# Patient Record
Sex: Male | Born: 1955 | Race: White | Hispanic: No | Marital: Married | Smoking: Former smoker
Health system: Southern US, Community
[De-identification: ages and names within clinical notes are randomized; demographics above are authoritative.]

## PROBLEM LIST (undated history)

## (undated) DIAGNOSIS — R52 Pain, unspecified: Secondary | ICD-10-CM

## (undated) DIAGNOSIS — I739 Peripheral vascular disease, unspecified: Secondary | ICD-10-CM

## (undated) DIAGNOSIS — H209 Unspecified iridocyclitis: Secondary | ICD-10-CM

## (undated) DIAGNOSIS — R972 Elevated prostate specific antigen [PSA]: Secondary | ICD-10-CM

## (undated) DIAGNOSIS — I7090 Unspecified atherosclerosis: Secondary | ICD-10-CM

## (undated) DIAGNOSIS — J449 Chronic obstructive pulmonary disease, unspecified: Secondary | ICD-10-CM

## (undated) DIAGNOSIS — R51 Headache: Secondary | ICD-10-CM

## (undated) DIAGNOSIS — F419 Anxiety disorder, unspecified: Secondary | ICD-10-CM

## (undated) DIAGNOSIS — R350 Frequency of micturition: Secondary | ICD-10-CM

## (undated) DIAGNOSIS — G709 Myoneural disorder, unspecified: Secondary | ICD-10-CM

## (undated) DIAGNOSIS — N281 Cyst of kidney, acquired: Secondary | ICD-10-CM

## (undated) DIAGNOSIS — K5790 Diverticulosis of intestine, part unspecified, without perforation or abscess without bleeding: Secondary | ICD-10-CM

## (undated) DIAGNOSIS — M1990 Unspecified osteoarthritis, unspecified site: Secondary | ICD-10-CM

## (undated) DIAGNOSIS — H269 Unspecified cataract: Secondary | ICD-10-CM

## (undated) DIAGNOSIS — C61 Malignant neoplasm of prostate: Secondary | ICD-10-CM

## (undated) HISTORY — DX: Diverticulosis of intestine, part unspecified, without perforation or abscess without bleeding: K57.90

## (undated) HISTORY — PX: EYE SURGERY: SHX253

## (undated) HISTORY — DX: Cyst of kidney, acquired: N28.1

## (undated) HISTORY — PX: BACK SURGERY: SHX140

## (undated) HISTORY — DX: Malignant neoplasm of prostate: C61

## (undated) HISTORY — PX: TONSILLECTOMY: SUR1361

## (undated) HISTORY — PX: CATARACT EXTRACTION W/ INTRAOCULAR LENS IMPLANT: SHX1309

## (undated) HISTORY — DX: Unspecified cataract: H26.9

## (undated) HISTORY — PX: INGUINAL HERNIA REPAIR: SUR1180

---

## 1994-12-30 HISTORY — PX: OTHER SURGICAL HISTORY: SHX169

## 1998-06-08 ENCOUNTER — Ambulatory Visit (HOSPITAL_BASED_OUTPATIENT_CLINIC_OR_DEPARTMENT_OTHER): Admission: RE | Admit: 1998-06-08 | Discharge: 1998-06-08 | Payer: Self-pay | Admitting: General Surgery

## 2001-01-02 ENCOUNTER — Ambulatory Visit (HOSPITAL_COMMUNITY): Admission: RE | Admit: 2001-01-02 | Discharge: 2001-01-02 | Payer: Self-pay | Admitting: Ophthalmology

## 2001-01-02 ENCOUNTER — Encounter: Payer: Self-pay | Admitting: Ophthalmology

## 2012-07-29 ENCOUNTER — Other Ambulatory Visit: Payer: Self-pay | Admitting: Orthopedic Surgery

## 2012-07-29 DIAGNOSIS — M48061 Spinal stenosis, lumbar region without neurogenic claudication: Secondary | ICD-10-CM

## 2012-08-03 ENCOUNTER — Ambulatory Visit
Admission: RE | Admit: 2012-08-03 | Discharge: 2012-08-03 | Disposition: A | Discharge status: Home / Self Care | Payer: PRIVATE HEALTH INSURANCE | Source: Ambulatory Visit | Attending: Orthopedic Surgery | Admitting: Orthopedic Surgery

## 2012-08-03 VITALS — BP 93/59 | HR 68

## 2012-08-03 DIAGNOSIS — M48061 Spinal stenosis, lumbar region without neurogenic claudication: Secondary | ICD-10-CM

## 2012-08-03 MED ORDER — DIAZEPAM 5 MG PO TABS
10.0000 mg | ORAL_TABLET | Freq: Once | ORAL | Status: AC
  Administered 2012-08-03: 10 mg via ORAL

## 2012-08-03 MED ORDER — IOHEXOL 180 MG/ML  SOLN
15.0000 mL | Freq: Once | INTRAMUSCULAR | Status: AC | PRN
  Administered 2012-08-03: 15 mL via INTRATHECAL

## 2013-01-12 ENCOUNTER — Other Ambulatory Visit: Payer: Self-pay | Admitting: Family Medicine

## 2013-01-12 DIAGNOSIS — N5089 Other specified disorders of the male genital organs: Secondary | ICD-10-CM

## 2013-01-15 ENCOUNTER — Other Ambulatory Visit: Payer: Self-pay | Admitting: Family Medicine

## 2013-01-15 ENCOUNTER — Ambulatory Visit
Admission: RE | Admit: 2013-01-15 | Discharge: 2013-01-15 | Disposition: A | Discharge status: Home / Self Care | Payer: PRIVATE HEALTH INSURANCE | Source: Ambulatory Visit | Attending: Family Medicine | Admitting: Family Medicine

## 2013-01-15 DIAGNOSIS — N5089 Other specified disorders of the male genital organs: Secondary | ICD-10-CM

## 2013-02-05 ENCOUNTER — Other Ambulatory Visit: Payer: Self-pay | Admitting: Urology

## 2013-02-24 ENCOUNTER — Other Ambulatory Visit: Payer: Self-pay | Admitting: Urology

## 2013-02-25 ENCOUNTER — Other Ambulatory Visit: Payer: Self-pay | Admitting: Urology

## 2013-02-26 ENCOUNTER — Encounter (HOSPITAL_BASED_OUTPATIENT_CLINIC_OR_DEPARTMENT_OTHER): Payer: Self-pay | Admitting: *Deleted

## 2013-02-26 NOTE — Progress Notes (Signed)
NPO AFTER MN. ARRIVES AT 0615. NEEDS HG. WILL DO FLEET ENEMA  AM OF SURG.

## 2013-03-05 ENCOUNTER — Encounter (HOSPITAL_BASED_OUTPATIENT_CLINIC_OR_DEPARTMENT_OTHER): Payer: Self-pay | Admitting: Anesthesiology

## 2013-03-05 ENCOUNTER — Ambulatory Visit (HOSPITAL_BASED_OUTPATIENT_CLINIC_OR_DEPARTMENT_OTHER)
Admission: RE | Admit: 2013-03-05 | Discharge: 2013-03-05 | Disposition: A | Discharge status: Home / Self Care | Payer: PRIVATE HEALTH INSURANCE | Source: Ambulatory Visit | Attending: Urology | Admitting: Urology

## 2013-03-05 ENCOUNTER — Encounter (HOSPITAL_BASED_OUTPATIENT_CLINIC_OR_DEPARTMENT_OTHER)
Admission: RE | Disposition: A | Discharge status: Home / Self Care | Payer: Self-pay | Source: Ambulatory Visit | Attending: Urology

## 2013-03-05 ENCOUNTER — Ambulatory Visit (HOSPITAL_BASED_OUTPATIENT_CLINIC_OR_DEPARTMENT_OTHER): Payer: PRIVATE HEALTH INSURANCE | Admitting: Anesthesiology

## 2013-03-05 ENCOUNTER — Encounter (HOSPITAL_BASED_OUTPATIENT_CLINIC_OR_DEPARTMENT_OTHER): Payer: Self-pay | Admitting: *Deleted

## 2013-03-05 DIAGNOSIS — C61 Malignant neoplasm of prostate: Secondary | ICD-10-CM | POA: Insufficient documentation

## 2013-03-05 DIAGNOSIS — N400 Enlarged prostate without lower urinary tract symptoms: Secondary | ICD-10-CM | POA: Insufficient documentation

## 2013-03-05 DIAGNOSIS — F17200 Nicotine dependence, unspecified, uncomplicated: Secondary | ICD-10-CM | POA: Insufficient documentation

## 2013-03-05 DIAGNOSIS — R972 Elevated prostate specific antigen [PSA]: Secondary | ICD-10-CM | POA: Insufficient documentation

## 2013-03-05 DIAGNOSIS — N433 Hydrocele, unspecified: Secondary | ICD-10-CM | POA: Insufficient documentation

## 2013-03-05 DIAGNOSIS — Z79899 Other long term (current) drug therapy: Secondary | ICD-10-CM | POA: Insufficient documentation

## 2013-03-05 HISTORY — DX: Unspecified osteoarthritis, unspecified site: M19.90

## 2013-03-05 HISTORY — PX: PROSTATE BIOPSY: SHX241

## 2013-03-05 HISTORY — DX: Elevated prostate specific antigen (PSA): R97.20

## 2013-03-05 HISTORY — DX: Malignant neoplasm of prostate: C61

## 2013-03-05 HISTORY — PX: HYDROCELE EXCISION: SHX482

## 2013-03-05 LAB — POCT HEMOGLOBIN-HEMACUE: Hemoglobin: 15.3 g/dL (ref 13.0–17.0)

## 2013-03-05 SURGERY — HYDROCELECTOMY
Anesthesia: General | Wound class: Clean

## 2013-03-05 MED ORDER — LIDOCAINE HCL (CARDIAC) 20 MG/ML IV SOLN
INTRAVENOUS | Status: DC | PRN
  Administered 2013-03-05: 40 mg via INTRAVENOUS
  Administered 2013-03-05: 60 mg via INTRAVENOUS

## 2013-03-05 MED ORDER — FENTANYL CITRATE 0.05 MG/ML IJ SOLN
INTRAMUSCULAR | Status: DC | PRN
  Administered 2013-03-05 (×4): 50 ug via INTRAVENOUS

## 2013-03-05 MED ORDER — LACTATED RINGERS IV SOLN
INTRAVENOUS | Status: DC | PRN
  Administered 2013-03-05 (×2): via INTRAVENOUS

## 2013-03-05 MED ORDER — CEFAZOLIN SODIUM 1-5 GM-% IV SOLN
1.0000 g | INTRAVENOUS | Status: DC
  Administered 2013-03-05: 2 g via INTRAVENOUS
  Filled 2013-03-05: qty 50

## 2013-03-05 MED ORDER — FENTANYL CITRATE 0.05 MG/ML IJ SOLN
25.0000 ug | INTRAMUSCULAR | Status: DC | PRN
  Filled 2013-03-05: qty 1

## 2013-03-05 MED ORDER — DEXAMETHASONE SODIUM PHOSPHATE 4 MG/ML IJ SOLN
INTRAMUSCULAR | Status: DC | PRN
  Administered 2013-03-05: 10 mg via INTRAVENOUS

## 2013-03-05 MED ORDER — PROPOFOL 10 MG/ML IV BOLUS
INTRAVENOUS | Status: DC | PRN
  Administered 2013-03-05: 50 mg via INTRAVENOUS
  Administered 2013-03-05: 250 mg via INTRAVENOUS

## 2013-03-05 MED ORDER — ONDANSETRON HCL 4 MG/2ML IJ SOLN
INTRAMUSCULAR | Status: DC | PRN
  Administered 2013-03-05: 4 mg via INTRAVENOUS

## 2013-03-05 MED ORDER — LACTATED RINGERS IV SOLN
INTRAVENOUS | Status: DC
  Administered 2013-03-05: 07:00:00 via INTRAVENOUS
  Filled 2013-03-05: qty 1000

## 2013-03-05 MED ORDER — CEPHALEXIN 500 MG PO CAPS: 500.0000 mg | ORAL_CAPSULE | Freq: Two times a day (BID) | ORAL | Status: DC

## 2013-03-05 MED ORDER — ACETAMINOPHEN 10 MG/ML IV SOLN
INTRAVENOUS | Status: DC | PRN
  Administered 2013-03-05: 1000 mg via INTRAVENOUS

## 2013-03-05 MED ORDER — KETOROLAC TROMETHAMINE 30 MG/ML IJ SOLN
15.0000 mg | Freq: Once | INTRAMUSCULAR | Status: DC | PRN
  Filled 2013-03-05: qty 1

## 2013-03-05 MED ORDER — BUPIVACAINE-EPINEPHRINE 0.25% -1:200000 IJ SOLN
INTRAMUSCULAR | Status: DC | PRN
  Administered 2013-03-05: 8 mL

## 2013-03-05 MED ORDER — OXYCODONE-ACETAMINOPHEN 5-325 MG PO TABS
1.0000 | ORAL_TABLET | ORAL | Status: DC | PRN
Start: 2013-03-05 — End: 2013-04-22

## 2013-03-05 MED ORDER — MIDAZOLAM HCL 5 MG/5ML IJ SOLN
INTRAMUSCULAR | Status: DC | PRN
  Administered 2013-03-05: 2 mg via INTRAVENOUS

## 2013-03-05 MED ORDER — PROMETHAZINE HCL 25 MG/ML IJ SOLN
6.2500 mg | INTRAMUSCULAR | Status: DC | PRN
  Filled 2013-03-05: qty 1

## 2013-03-05 SURGICAL SUPPLY — 35 items
BANDAGE GAUZE ELAST BULKY 4 IN (GAUZE/BANDAGES/DRESSINGS) ×3 IMPLANT
BLADE SURG 15 STRL LF DISP TIS (BLADE) ×2 IMPLANT
BLADE SURG 15 STRL SS (BLADE) ×3
CLOTH BEACON ORANGE TIMEOUT ST (SAFETY) ×3 IMPLANT
COVER MAYO STAND STRL (DRAPES) ×3 IMPLANT
COVER TABLE BACK 60X90 (DRAPES) ×6 IMPLANT
DRAIN PENROSE 18X1/4 LTX STRL (WOUND CARE) ×1 IMPLANT
DRAPE PED LAPAROTOMY (DRAPES) ×3 IMPLANT
DRESSING TELFA 8X3 (GAUZE/BANDAGES/DRESSINGS) ×3 IMPLANT
ELECT REM PT RETURN 9FT ADLT (ELECTROSURGICAL) ×3
ELECTRODE REM PT RTRN 9FT ADLT (ELECTROSURGICAL) ×2 IMPLANT
GAUZE SPONGE 4X4 16PLY XRAY LF (GAUZE/BANDAGES/DRESSINGS) ×2 IMPLANT
GLOVE BIOGEL M STRL SZ7.5 (GLOVE) ×3 IMPLANT
GLOVE ECLIPSE 6.5 STRL STRAW (GLOVE) ×1 IMPLANT
GLOVE INDICATOR 6.5 STRL GRN (GLOVE) ×1 IMPLANT
GLOVE SURG SS PI 8.0 STRL IVOR (GLOVE) IMPLANT
GOWN STRL REIN XL XLG (GOWN DISPOSABLE) ×3 IMPLANT
NEEDLE HYPO 22GX1.5 SAFETY (NEEDLE) ×1 IMPLANT
NS IRRIG 500ML POUR BTL (IV SOLUTION) ×3 IMPLANT
PACK BASIN DAY SURGERY FS (CUSTOM PROCEDURE TRAY) ×3 IMPLANT
SPONGE LAP 4X18 X RAY DECT (DISPOSABLE) ×5 IMPLANT
SUPPORT SCROTAL LG STRP (MISCELLANEOUS) ×1 IMPLANT
SUPPORT SCROTAL MED ADLT STRP (MISCELLANEOUS) IMPLANT
SURGILUBE 2OZ TUBE FLIPTOP (MISCELLANEOUS) ×3 IMPLANT
SUT CHROMIC 3 0 SH 27 (SUTURE) ×2 IMPLANT
SUT CHROMIC 4 0 RB 1X27 (SUTURE) IMPLANT
SUT VIC AB 2-0 SH 27 (SUTURE) ×6
SUT VIC AB 2-0 SH 27XBRD (SUTURE) IMPLANT
SYR CONTROL 10ML LL (SYRINGE) ×1 IMPLANT
TOWEL OR 17X24 6PK STRL BLUE (TOWEL DISPOSABLE) ×4 IMPLANT
TRAY DSU PREP LF (CUSTOM PROCEDURE TRAY) ×3 IMPLANT
TUBE CONNECTING 12X1/4 (SUCTIONS) ×3 IMPLANT
UNDERPAD 30X30 INCONTINENT (UNDERPADS AND DIAPERS) ×3 IMPLANT
WATER STERILE IRR 500ML POUR (IV SOLUTION) IMPLANT
YANKAUER SUCT BULB TIP NO VENT (SUCTIONS) ×3 IMPLANT

## 2013-03-05 NOTE — H&P (Signed)
Urology Admission H&P  History of Present Illness:   This is a 57 year old male who presents for left hydrocelectomy and prostate biopsy. He is noted to have left scrotal swelling and an ultrasound revealed a large 12 cm left hydrocele. The left testicle appeared normal.  PSA screening was performed as the patient has a large prostate on exam. His PSA came back significantly elevated at 14. A few weeks later the PSA was repeated and noted to be 14 again. Patient has no history of prostate biopsy.  Past Medical History  Diagnosis Date  . Arthritis     lumbar  . Elevated PSA   . Hydrocele, left    Past Surgical History  Procedure Laterality Date  . Removal bursa sac, left elbow  1996  . Inguinal hernia repair Bilateral AGE 52  . Cataract extraction w/ intraocular lens implant Left     Home Medications:  Prescriptions prior to admission  Medication Sig Dispense Refill  . levofloxacin (LEVAQUIN) 500 MG tablet Take 500 mg by mouth daily.       Allergies: No Known Allergies  History reviewed. No pertinent family history. Social History:  reports that he has been smoking Cigarettes.  He has a 60 pack-year smoking history. He has never used smokeless tobacco. He reports that he drinks about 4.0 ounces of alcohol per week. He reports that he does not use illicit drugs.  Review of Systems  All other systems reviewed and are negative.    Physical Exam:  Vital signs in last 24 hours: Temp:  [98 F (36.7 C)] 98 F (36.7 C) (03/07 0647) Pulse Rate:  [81] 81 (03/07 0647) Resp:  [18] 18 (03/07 0647) BP: (118)/(79) 118/79 mmHg (03/07 0647) SpO2:  [96 %] 96 % (03/07 0647) Weight:  [77.565 kg (171 lb)] 77.565 kg (171 lb) (03/07 0647) Physical Exam  Constitutional: He is oriented to person, place, and time. He appears well-developed and well-nourished.  HENT:  Head: Normocephalic.  Neck: Normal range of motion.  Cardiovascular: Normal rate and regular rhythm.   Respiratory: Effort  normal.  GI: Soft.  Neurological: He is alert and oriented to person, place, and time.  Skin: Skin is warm and dry.  Psychiatric: He has a normal mood and affect.    Laboratory Data:  Results for orders placed during the hospital encounter of 03/05/13 (from the past 24 hour(s))  POCT HEMOGLOBIN-HEMACUE     Status: None   Collection Time    03/05/13  7:08 AM      Result Value Range   Hemoglobin 15.3  13.0 - 17.0 g/dL   No results found for this or any previous visit (from the past 240 hour(s)). Creatinine: No results found for this basename: CREATININE,  in the last 168 hours  Impression/Assessment:  Left hydrocele Elevated PSA BPH on exam  Plan:  1) Transrectal ultrasound and prostate biopsy - we went over again the nature risks and benefits of PSA screening. We discussed the nature risks benefits and alternatives to transrectal ultrasound and prostate biopsy. We discussed risk such as bleeding infection and urinary retention among others. We discussed this prostate biopsy is positive for prostate cancer management may include active surveillance or treatment with surgery or radiation. All questions answered and elected to proceed with biopsy. 2) left hydrocelectomy - I discussed with the patient the nature, potential benefits, risks and alternatives to hydrocelectomy including surveillance/do nothing or aspiration, including side effects of the proposed treatment, the likelihood of the patient achieving the  goals of the procedure, and any potential problems that might occur during the procedure or recuperation. All questions answered. Patient elects to proceed with hydrocelectomy.     Antony Haste 03/05/2013, 8:12 AM

## 2013-03-05 NOTE — Anesthesia Preprocedure Evaluation (Addendum)
Anesthesia Evaluation  Patient identified by MRN, date of birth, ID band Patient awake    Reviewed: Allergy & Precautions, H&P , NPO status , Patient's Chart, lab work & pertinent test results  Airway Mallampati: II TM Distance: >3 FB Neck ROM: Full    Dental no notable dental hx.    Pulmonary Current Smoker,  breath sounds clear to auscultation  Pulmonary exam normal       Cardiovascular negative cardio ROS  Rhythm:Regular Rate:Normal     Neuro/Psych negative neurological ROS  negative psych ROS   GI/Hepatic negative GI ROS, Neg liver ROS,   Endo/Other  negative endocrine ROS  Renal/GU negative Renal ROS  negative genitourinary   Musculoskeletal negative musculoskeletal ROS (+)   Abdominal   Peds negative pediatric ROS (+)  Hematology negative hematology ROS (+)   Anesthesia Other Findings   Reproductive/Obstetrics negative OB ROS                           Anesthesia Physical Anesthesia Plan  ASA: II  Anesthesia Plan: General   Post-op Pain Management:    Induction: Intravenous  Airway Management Planned: LMA  Additional Equipment:   Intra-op Plan:   Post-operative Plan:   Informed Consent: I have reviewed the patients History and Physical, chart, labs and discussed the procedure including the risks, benefits and alternatives for the proposed anesthesia with the patient or authorized representative who has indicated his/her understanding and acceptance.   Dental advisory given  Plan Discussed with: CRNA and Surgeon  Anesthesia Plan Comments: (Patient had vagal reaction 5-10 minutes post IV start; 94/65 pulse 70's  Feeling better after fluid bolus)       Anesthesia Quick Evaluation

## 2013-03-05 NOTE — Transfer of Care (Signed)
Immediate Anesthesia Transfer of Care Note  Patient: David King  Procedure(s) Performed: Procedure(s) (LRB): HYDROCELECTOMY ADULT (Left) PROSTATE BIOPSY and ultrasound (N/A)  Patient Location: PACU  Anesthesia Type: General  Level of Consciousness: drowsy and restless  Airway & Oxygen Therapy: Patient Spontanous Breathing and Patient connected to face mask oxygen  Post-op Assessment: Report given to PACU RN and Post -op Vital signs reviewed and stable  Post vital signs: Reviewed and stable  Complications: No apparent anesthesia complications

## 2013-03-05 NOTE — Anesthesia Procedure Notes (Signed)
Procedure Name: LMA Insertion Date/Time: 03/05/2013 8:15 AM Performed by: Norva Pavlov Pre-anesthesia Checklist: Patient identified, Emergency Drugs available, Suction available and Patient being monitored Patient Re-evaluated:Patient Re-evaluated prior to inductionOxygen Delivery Method: Circle System Utilized Preoxygenation: Pre-oxygenation with 100% oxygen Intubation Type: IV induction Ventilation: Mask ventilation without difficulty LMA: LMA inserted LMA Size: 4.0 Number of attempts: 1 Airway Equipment and Method: bite block Placement Confirmation: positive ETCO2 Tube secured with: Tape Dental Injury: Teeth and Oropharynx as per pre-operative assessment

## 2013-03-05 NOTE — Op Note (Signed)
Pre-operative diagnosis: left hydrocele, BPH, elevated PSA Post-operative diagnosis: left hydrocele, BPH, elevated PSA  Procedure: EUA TRUS prostate Prostate needle biopsy US guidance for needle biopsy Left hydrocelectomy  Surgeon: Eward Rutigliano  Type of Anesthesia: Gen  Findings: EUA -  DRE: 40 gram prostate, subtle induration of left, slight L > R lobe asymmetry with left lobe slightly larger. No distinct hard area or nodule.   Prostate U/S - calcifications along urethra; no focal hypoechoic area;  Length 3.31 cm Width 5 cm Height 2.4 cm Size 20.76 g  Left hydrocele, 450 mL  Description of procedure: After consent was obtained patient brought to the operating room. After adequate anesthesia he is placed in left lateral decubitus position with the right side up. The transrectal ultrasound probe was placed per rectum and prostate images were obtained axially from the seminal vesicles, base to apex. Also sagittal views left mid and right were obtained.  A standard 12 core prostate biopsy was then performed at the left and right base mid and apex x2 each going from lateral to medial.   The patient was then placed supine . The external genitalia were prepped and draped in the usual sterile fashion.a 16 French Foley catheter was placed and left to gravity drainage. The left hemiscrotum was infiltrated with quarter percent Marcaine with epi and a transverse incision made. The dartos and cremasteric fascia was dissected down to a large hydrocele sac and peeled off superiorly inferiorly and posteriorly. The hydrocele sac was then delivered and further sweeping with a moist lap and allowed all the surrounding fascial layers to be cleaned off. The hydrocele sac was then opened and noted to have straw-colored fluid. 450 mL of fluid was drained. The testicle was delivered and was palpably normal. The hydrocele sac narrowed and and there was a connection to another more superior hydrocele sac that was  only 3-4 cm in size. Prior to opening the hydrocele sac pressure on the large sac cause fluid to travels superiorly into the smaller sac but not up toward the cord. Both the main hydrocele sac and the smaller more severe sac were opened and no connection was seen with the cord. The excess sac from the main large sac was excised and sent to pathology. No sac superiorly was removed. Adequate hemostasis was insured in the more superior hydrocele sac was everted around the cord with a figure-of-eight suture and was loose without any constriction. A connection to the main sac was left anteriorly on the cord in this area was not everted or sutured as not to put any pressure on the cord. Then again the main sac was everted and sutured with a running 2-0 Vicryl suture. Adequate hemostasis was insured. The Penrose drain was placed in the scrotal exiting out a separate stab wound in the more dependent location. The testicle was then placed back in the scrotum without torsion. The  Dartos  fascia was closed with a running 2-0 Vicryl suture. The skin was closed with chromic interrupted horizontal mattress sutures. The drain was secured with a loose chromic.  The urine was clear in the Foley catheter was removed. The scrotal incision was cleaned and Telfa fluffs, gauze and a jock strap were placed. He was awakened taken to room in stable condition.  Complications: None  Blood loss: Minimal  Drains: Penrose left hemiscrotum  Specimens: 1) prostate needle biopsy x12, individually labeled 2) hydrocele sac Specimens to pathology  Disposition: Patient stable to PACU.

## 2013-03-05 NOTE — Anesthesia Postprocedure Evaluation (Signed)
  Anesthesia Post-op Note  Patient: David King  Procedure(s) Performed: Procedure(s) (LRB): HYDROCELECTOMY ADULT (Left) PROSTATE BIOPSY and ultrasound (N/A)  Patient Location: PACU  Anesthesia Type: General  Level of Consciousness: awake and alert   Airway and Oxygen Therapy: Patient Spontanous Breathing  Post-op Pain: mild  Post-op Assessment: Post-op Vital signs reviewed, Patient's Cardiovascular Status Stable, Respiratory Function Stable, Patent Airway and No signs of Nausea or vomiting  Last Vitals:  Filed Vitals:   03/05/13 0953  BP: 108/72  Pulse: 76  Temp: 36.3 C  Resp: 14    Post-op Vital Signs: stable   Complications: No apparent anesthesia complications

## 2013-03-08 ENCOUNTER — Encounter (HOSPITAL_BASED_OUTPATIENT_CLINIC_OR_DEPARTMENT_OTHER): Payer: Self-pay | Admitting: Urology

## 2013-03-11 ENCOUNTER — Other Ambulatory Visit (HOSPITAL_COMMUNITY): Payer: Self-pay | Admitting: Urology

## 2013-03-11 DIAGNOSIS — C61 Malignant neoplasm of prostate: Secondary | ICD-10-CM

## 2013-03-19 ENCOUNTER — Encounter (HOSPITAL_COMMUNITY)
Admission: RE | Admit: 2013-03-19 | Discharge: 2013-03-19 | Disposition: A | Discharge status: Home / Self Care | Payer: PRIVATE HEALTH INSURANCE | Source: Ambulatory Visit | Attending: Urology | Admitting: Urology

## 2013-03-19 DIAGNOSIS — N281 Cyst of kidney, acquired: Secondary | ICD-10-CM

## 2013-03-19 DIAGNOSIS — C61 Malignant neoplasm of prostate: Secondary | ICD-10-CM | POA: Insufficient documentation

## 2013-03-19 HISTORY — DX: Cyst of kidney, acquired: N28.1

## 2013-03-19 MED ORDER — TECHNETIUM TC 99M MEDRONATE IV KIT
25.2000 | PACK | Freq: Once | INTRAVENOUS | Status: AC | PRN
  Administered 2013-03-19: 25.2 via INTRAVENOUS

## 2013-03-24 NOTE — Addendum Note (Signed)
Addendum created 03/24/13 0858 by Norva Pavlov, CRNA   Modules edited: Anesthesia Medication Administration

## 2013-03-31 ENCOUNTER — Other Ambulatory Visit: Payer: Self-pay | Admitting: Urology

## 2013-04-22 ENCOUNTER — Encounter: Payer: Self-pay | Admitting: *Deleted

## 2013-04-22 ENCOUNTER — Encounter (HOSPITAL_COMMUNITY): Payer: Self-pay | Admitting: Pharmacy Technician

## 2013-04-22 DIAGNOSIS — C61 Malignant neoplasm of prostate: Secondary | ICD-10-CM | POA: Insufficient documentation

## 2013-04-22 NOTE — Progress Notes (Addendum)
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

## 2013-04-22 NOTE — Progress Notes (Signed)
GU Location of Tumor / Histology: Prostate, 03/05/13 bx  If Prostate Cancer, Gleason Score is 3 + 4 & 3+3 ) and PSA is ( 14.8) volume=40cc  Patient presented elevated PSA ,left scrotal swelling 03/05/13 ,  Biopsies of Prostate 03/05/13  revealed Adenocarcinoma   Past/Anticipated interventions by urology, if any: U/S  01/15/13 =large 12 cm left hydrocele, PSA lab =14  Past/Anticipated interventions by medical oncology, if any: Robotic  Laparoscopic Prostatectomy scheduled 05/06/13 with Dr.Borden  Weight changes, if any: no Bowel/Bladder complaints, if WUJ:WJXBJYNWG,NFAOZHYQMVHQI,ONGEXBM,WUXL stream ,ED  Nausea/Vomiting, if any: No Pain issues, if any:  No SAFETY ISSUES:No  Prior radiationNO  Pacemaker/ICD? No  Possible current pregnancy? N/A Is the patient on methotrexate? *No Current Complaints / other details:nocturia x2,

## 2013-04-26 ENCOUNTER — Encounter: Payer: Self-pay | Admitting: Radiation Oncology

## 2013-04-26 ENCOUNTER — Encounter (HOSPITAL_COMMUNITY)
Admission: RE | Admit: 2013-04-26 | Discharge: 2013-04-26 | Disposition: A | Discharge status: Home / Self Care | Payer: PRIVATE HEALTH INSURANCE | Source: Ambulatory Visit | Attending: Urology | Admitting: Urology

## 2013-04-26 ENCOUNTER — Ambulatory Visit
Admission: RE | Admit: 2013-04-26 | Discharge: 2013-04-26 | Disposition: A | Discharge status: Home / Self Care | Payer: PRIVATE HEALTH INSURANCE | Source: Ambulatory Visit | Attending: Radiation Oncology | Admitting: Radiation Oncology

## 2013-04-26 ENCOUNTER — Encounter (HOSPITAL_COMMUNITY): Payer: Self-pay

## 2013-04-26 ENCOUNTER — Ambulatory Visit (HOSPITAL_COMMUNITY)
Admission: RE | Admit: 2013-04-26 | Discharge: 2013-04-26 | Disposition: A | Discharge status: Home / Self Care | Payer: PRIVATE HEALTH INSURANCE | Source: Ambulatory Visit | Attending: Urology | Admitting: Urology

## 2013-04-26 VITALS — BP 108/60 | HR 80 | Temp 97.8°F | Resp 20 | Ht 69.0 in | Wt 171.9 lb

## 2013-04-26 DIAGNOSIS — F17200 Nicotine dependence, unspecified, uncomplicated: Secondary | ICD-10-CM | POA: Insufficient documentation

## 2013-04-26 DIAGNOSIS — C61 Malignant neoplasm of prostate: Secondary | ICD-10-CM

## 2013-04-26 HISTORY — DX: Unspecified iridocyclitis: H20.9

## 2013-04-26 HISTORY — DX: Myoneural disorder, unspecified: G70.9

## 2013-04-26 HISTORY — DX: Pain, unspecified: R52

## 2013-04-26 HISTORY — DX: Frequency of micturition: R35.0

## 2013-04-26 HISTORY — DX: Headache: R51

## 2013-04-26 HISTORY — DX: Anxiety disorder, unspecified: F41.9

## 2013-04-26 LAB — CBC
HCT: 39.7 % (ref 39.0–52.0)
Hemoglobin: 13.5 g/dL (ref 13.0–17.0)
MCH: 32.8 pg (ref 26.0–34.0)
MCHC: 34 g/dL (ref 30.0–36.0)
MCV: 96.6 fL (ref 78.0–100.0)
Platelets: 262 10*3/uL (ref 150–400)
RBC: 4.11 MIL/uL — ABNORMAL LOW (ref 4.22–5.81)
RDW: 13.9 % (ref 11.5–15.5)
WBC: 7.8 10*3/uL (ref 4.0–10.5)

## 2013-04-26 LAB — BASIC METABOLIC PANEL
BUN: 14 mg/dL (ref 6–23)
CO2: 30 mEq/L (ref 19–32)
Calcium: 10 mg/dL (ref 8.4–10.5)
Chloride: 102 mEq/L (ref 96–112)
Creatinine, Ser: 1.04 mg/dL (ref 0.50–1.35)
GFR calc Af Amer: 90 mL/min — ABNORMAL LOW (ref >90)
GFR calc non Af Amer: 77 mL/min — ABNORMAL LOW (ref >90)
Glucose, Bld: 96 mg/dL (ref 70–99)
Potassium: 4 mEq/L (ref 3.5–5.1)
Sodium: 140 mEq/L (ref 135–145)

## 2013-04-26 LAB — SURGICAL PCR SCREEN
MRSA, PCR: NEGATIVE
Staphylococcus aureus: NEGATIVE

## 2013-04-26 NOTE — Progress Notes (Signed)
Radiation Oncology         [phone] ________________________________  Initial outpatient Consultation  Name: David King MRN: [id number]  Date: 04/26/2013  DOB: [birth year]  WU:JWJX,BJYNWGN Hessie Diener, MD  Crecencio Mc, MD   REFERRING PHYSICIAN: Crecencio Mc, MD  DIAGNOSIS: 57 y.o. gentleman with stage T1c adenocarcinoma of the prostate with a Gleason's score of 4+3 and a PSA of 14.8  HISTORY OF PRESENT ILLNESS::David King is a 57 y.o. gentleman.  He was initially seen by Dr. Mena Goes for a 12 cm hydrocele.  At that time he was also noted to have an elevated PSA of 14.8.  Digital rectal examination was performed at that time revealing no nodules.  The patient proceeded to hydrocele repair and transrectal ultrasound with 12 biopsies of the prostate on 03/05/13.  Out of 12 core biopsies, 12 were positive.  The maximum Gleason score was 4+3.  Bone scan and CT pelvis show no metastatic disease.  The patient has met with Dr. Laverle Patter.  At this time, he is tentatively planning robotic assisted laparoscopic radical prostatectomy, and he has kindly been referred today for discussion of potential radiation treatment options as an alternative to surgery, and moreover as a post-operative treatment for adjuvant versus salvage therapy, given the increased risk for adverse pathology features.   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    PAST SURGICAL HISTORY: Past Surgical History  Procedure Laterality Date  . Removal bursa sac, left elbow  1996  . Inguinal hernia repair Bilateral AGE 89  . Cataract extraction w/ intraocular lens implant Left   . Hydrocele excision Left 03/05/2013    Procedure: HYDROCELECTOMY ADULT;  Surgeon: Antony Haste, MD;  Location: [facility];  Service: Urology;  Laterality: Left;  . Prostate biopsy N/A 03/05/2013    Procedure: PROSTATE BIOPSY and ultrasound;  Surgeon: Antony Haste, MD;  Location: [facility];  Service: Urology;  Laterality: N/A;  . Eye surgery      RETINAL SURGERY LEFT EYE  . Tonsillectomy      as achild    FAMILY HISTORY: family history includes Cancer in his father and paternal grandfather.  SOCIAL HISTORY:  reports that he has been smoking Cigarettes.  He has a 60 pack-year smoking history. He has never used smokeless tobacco. He reports that he drinks about 4.0 ounces of alcohol per week. He reports that he does not use illicit drugs.  ALLERGIES: Review of patient's allergies indicates no known allergies.  MEDICATIONS:  Current Outpatient Prescriptions  Medication Sig Dispense Refill  . ibuprofen (ADVIL,MOTRIN) 200 MG tablet Take 200 mg by mouth every 6 (six) hours as needed for pain.      . prednisoLONE acetate (PRED FORTE) 1 % ophthalmic suspension Place 1 drop into the left eye 2 (two) times daily.       No current facility-administered medications for this encounter.    REVIEW OF SYSTEMS:  A 15 point review of systems is documented in the electronic medical record. This was obtained by the nursing staff. However, I reviewed this with the patient to discuss relevant findings and make appropriate changes.  A comprehensive review of systems was negative.Marland Kitchen   PHYSICAL EXAM: This patient is in no acute distress.  He is alert and oriented.   height is 5\' 9"  (  1.753 m) and weight is 171 lb 14.4 oz (77.973 kg). His oral temperature is 97.8 F (36.6 C). His blood pressure is 108/60 and his pulse is 80. His respiration is 20.  He exhibits no respiratory distress or labored breathing.  He appears neurologically intact.  His mood is pleasant.  His affect is appropriate.  Please note the digital rectal exam findings described above.  LABORATORY DATA:  Lab Results    Component Value Date   WBC 7.8 04/26/2013   HGB 13.5 04/26/2013   HCT 39.7 04/26/2013   MCV 96.6 04/26/2013   PLT 262 04/26/2013   Lab Results  Component Value Date   NA 140 04/26/2013   No results found for this basename: ALT,  AST,  GGT,  ALKPHOS,  BILITOT     RADIOGRAPHY: CT and Bone Scan showed no metastases    IMPRESSION: This gentleman is a  57 y.o. gentleman with stage T1c adenocarcinoma of the prostate with a Gleason's score of 4+3 and a PSA of 14.8.  His T-Stage, Gleason's Score, and PSA put him into the intermediate risk group.  Accordingly he is eligible for a variety of potential treatment options including assisted laparoscopic radical prostatectomy or radiation therapy and androgen deprivation with or without seed implant.  PLAN:Today I reviewed the findings and workup thus far.  We discussed the natural history of prostate cancer.  We reviewed the the implications of T-stage, Gleason's Score, and PSA on decision-making and outcomes related to prostate cancer.  We discussed radiation treatment in the management of prostate cancer with regard to the logistics and delivery of external beam radiation treatment as well as the logistics and delivery of prostate brachytherapy.  We compared and contrasted each of these approaches and also compared these against prostatectomy.    The patient focused most of his questions and interest in robotic-assisted laparoscopic radical prostatectomy.  We discussed some of the potential advantages of surgery including surgical staging, the availability of salvage radiotherapy to the prostatic fossa, and the confidence associated with immediate biochemical response.  We discussed some of the potential proven indications for postoperative radiotherapy including positive margins, extracapsular extension, and seminal vesicle involvement. We also talked about some of the other potential findings leading to a recommendation for radiotherapy including a non-zero  postoperative PSA and positive lymph nodes.   I filled out a patient counseling form for him outlining his disease characteristics with relevant treatment diagrams and a thorough delineation of radiation including the logistics. The counseling form highlighted our discussion on the potential side effects associated with radiation therapy. The patient was given the form and we retained a copy for our records.   The patient would like to proceed with prostatectomy. I enjoyed meeting with him today, and will look forward to participating in the care of this very nice gentleman.  I will look forward to following his progress   I spent 60 minutes minutes face to face with the patient and more than 50% of that time was spent in counseling and/or coordination of care.      ------------------------------------------------  Artist Pais. Kathrynn Running, M.D.

## 2013-04-26 NOTE — Progress Notes (Signed)
Please see the Nurse Progress Note in the MD Initial Consult Encounter for this patient. 

## 2013-04-26 NOTE — Pre-Procedure Instructions (Signed)
EKG AND CXR WERE DONE TODAY - PREOP AT [facility]. 

## 2013-04-26 NOTE — Patient Instructions (Signed)
YOUR SURGERY IS SCHEDULED AT [facility]  ON:  Thursday  5/8  REPORT TO [facility] AT:  8:30 AM      PHONE # FOR SHORT STAY IS [phone]  FOLLOW BOWEL PREP INSTRUCTIONS DAY BEFORE YOUR SURGERY - INSTRUCTIONS FROM DR. BORDEN'S OFFICE.  DO NOT EAT OR DRINK ANYTHING AFTER MIDNIGHT THE NIGHT BEFORE YOUR SURGERY.  YOU MAY BRUSH YOUR TEETH, RINSE OUT YOUR MOUTH--BUT NO WATER, NO FOOD, NO CHEWING GUM, NO MINTS, NO CANDIES, NO CHEWING TOBACCO.  PLEASE TAKE THE FOLLOWING MEDICATIONS THE AM OF YOUR SURGERY WITH A FEW SIPS OF WATER:  USE YOUR EYEDROPS AND BRING YOUR EYEDROPS.    DO NOT BRING VALUABLES, MONEY, CREDIT CARDS.  DO NOT WEAR JEWELRY, MAKE-UP, NAIL POLISH AND NO METAL PINS OR CLIPS IN YOUR HAIR. CONTACT LENS, DENTURES / PARTIALS, GLASSES SHOULD NOT BE WORN TO SURGERY AND IN MOST CASES-HEARING AIDS WILL NEED TO BE REMOVED.  BRING YOUR GLASSES CASE, ANY EQUIPMENT NEEDED FOR YOUR CONTACT LENS. FOR PATIENTS ADMITTED TO THE HOSPITAL--CHECK OUT TIME THE DAY OF DISCHARGE IS 11:00 AM.  ALL INPATIENT ROOMS ARE PRIVATE - WITH BATHROOM, TELEPHONE, TELEVISION AND WIFI INTERNET.                               PLEASE READ OVER ANY  FACT SHEETS THAT YOU WERE GIVEN: MRSA INFORMATION, BLOOD TRANSFUSION INFORMATION, INCENTIVE SPIROMETER INFORMATION. FAILURE TO FOLLOW THESE INSTRUCTIONS MAY RESULT IN THE CANCELLATION OF YOUR SURGERY.   PATIENT SIGNATURE_________________________________

## 2013-04-27 ENCOUNTER — Encounter: Payer: Self-pay | Admitting: Radiation Oncology

## 2013-04-27 LAB — ABO/RH: ABO/RH(D): O POS

## 2013-05-05 NOTE — H&P (Signed)
Chief Complaint  Prostate Cancer     History of Present Illness     [name] is a 57 year old gentleman who presented to Dr. Mena Goes in February 2014 with complaints of a left hydrocele and an elevated PSA.  His PSA remained persistently elevated when rechecked at 14.8.  On 03/05/13, he underwent a hydrocele repair and prostate biopsy at the same setting. 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 He underwent staging studies including a CT scan of the abdomen and pelvis and bone scan on 03/19/13 which did not demonstrate obvious metastatic disease.  However, there was a report of a suspicious 1.2 cm left common iliac lymph node without additional pelvic lymphadenopathy or retroperitoneal lymphadenopathy. However, I independently reviewed his CT scan and this appears to be consistent with a left inguinal lymph node and not a common iliac lymph node. Overall, he is quite healthy with no major medical comorbidities. He initially was under the impression that he did not have a family history of prostate cancer although his mother recently informed him that his father had been diagnosed with prostate cancer although ultimately died of a brain tumor which was unrelated to his prostate cancer diagnosis.  TNM stage: cT1c N0-1 M0 PSA: 14.80 Gleason score: 4+3=7 Biopsy (03/05/13): 12/12 cores positive    Left: L lateral apex (65%, 3+3=6), L apex (40%, 3+3=6, PNI), L lateral mid (60%, 3+4=7), L mid (50%, 3+4=7), L lateral base (90%, 3+4=7, PNI), L base (60%, 3+4=7, PNI)    Right: R apex (95%, 3+4=7, PNI), R lateral apex (100%, 3+4=7, PNI), R mid (10%, 3+4=7), R lateral mid (95%, 4+3=7, PNI), R base (50%, 3+4=7), R lateral base (50%, 3+4=7, PNI) Prostate volume: 20.8 cc  Nomogram OC disease: 56% EPE: 34% SVI: 23% LNI: 4.2% PFS (surgery): 62% at 5 years, 48% at 10 years Cancer specific survival: 99% at 10 years, 98% at 15 years  Urinary  function: He does have moderate baseline voiding symptoms including urinary frequency, intermittency, urgency, and a weak stream. His baseline IPSS is 22 (QOL-3). Erectile function: He does have severe pre-existing erectile dysfunction. SHIM score is 3.   Past Medical History Problems  1. History of  Arthritis V13.4 2. History of  Depression 311  Surgical History Problems  1. History of  Biopsy Of The Prostate Needle 2. History of  Cataract Surgery Left 3. History of  Hernia Repair Bilateral 4. History of  Surgery Tunica Vaginalis Excision Of Hydrocele Left   His hernia repairs were bilateral inguinal hernia repairs.   Current Meds 1. Ibuprofen TABS; Therapy: (Recorded:06Feb2014) to 2. Pred Forte 1 % Ophthalmic Suspension; Therapy: (Recorded:06Feb2014) to  Allergies Medication  1. No Known Drug Allergies  Family History Problems  1. Paternal history of  Brain Tumor 2. Family history of  Death In The Family Father 3. Family history of  Family Health Status Of Mother - Alive 4. Paternal history of  Prostate Cancer V16.42  Social History Problems    Alcohol Use 5 per day   Current Smoker 305.1 smoking 1 1/2 ppd for 88yrs   Marital History - Currently Married   Occupation: Naval architect  Review of Systems Constitutional, skin, eye, otolaryngeal, hematologic/lymphatic, cardiovascular, pulmonary, endocrine, musculoskeletal, gastrointestinal, neurological and psychiatric system(s) were reviewed and pertinent findings if present are noted.  Cardiovascular: no chest pain and no leg swelling.  Respiratory: no shortness of breath and no cough.  Psychiatric: anxiety and depression.  Physical Exam Constitutional: Well nourished and well developed . No acute distress.  ENT:. The ears and nose are normal in appearance.  Neck: The appearance of the neck is normal and no neck mass is present.  Pulmonary: No respiratory distress, normal respiratory rhythm and effort and  clear bilateral breath sounds.  Cardiovascular: Heart rate and rhythm are normal . No peripheral edema.  Abdomen: left inguinal, right inguinal incision site(s) well healed. The abdomen is soft and nontender. No masses are palpated. No CVA tenderness. No hernias are palpable. No hepatosplenomegaly noted.     Discussion/Summary  1. Prostate cancer: He has elected to proceed with primary surgical therapy and will undergo a robotic prostatectomy and bilateral pelvic lymphadenectomy.

## 2013-05-06 ENCOUNTER — Inpatient Hospital Stay (HOSPITAL_COMMUNITY): Payer: PRIVATE HEALTH INSURANCE | Admitting: Anesthesiology

## 2013-05-06 ENCOUNTER — Encounter (HOSPITAL_COMMUNITY): Payer: Self-pay | Admitting: *Deleted

## 2013-05-06 ENCOUNTER — Encounter (HOSPITAL_COMMUNITY): Payer: Self-pay | Admitting: Anesthesiology

## 2013-05-06 ENCOUNTER — Encounter (HOSPITAL_COMMUNITY)
Admission: RE | Disposition: A | Discharge status: Home / Self Care | Payer: Self-pay | Source: Ambulatory Visit | Attending: Urology

## 2013-05-06 ENCOUNTER — Inpatient Hospital Stay (HOSPITAL_COMMUNITY)
Admission: RE | Admit: 2013-05-06 | Discharge: 2013-05-07 | DRG: 708 | Disposition: A | Discharge status: Home / Self Care | Payer: PRIVATE HEALTH INSURANCE | Source: Ambulatory Visit | Attending: Urology | Admitting: Urology

## 2013-05-06 DIAGNOSIS — F17200 Nicotine dependence, unspecified, uncomplicated: Secondary | ICD-10-CM | POA: Diagnosis present

## 2013-05-06 DIAGNOSIS — C61 Malignant neoplasm of prostate: Principal | ICD-10-CM | POA: Diagnosis present

## 2013-05-06 HISTORY — PX: ROBOT ASSISTED LAPAROSCOPIC RADICAL PROSTATECTOMY: SHX5141

## 2013-05-06 HISTORY — PX: LYMPHADENECTOMY: SHX5960

## 2013-05-06 LAB — TYPE AND SCREEN
ABO/RH(D): O POS
Antibody Screen: NEGATIVE

## 2013-05-06 LAB — HEMOGLOBIN AND HEMATOCRIT, BLOOD
HCT: 37.4 % — ABNORMAL LOW (ref 39.0–52.0)
Hemoglobin: 12.5 g/dL — ABNORMAL LOW (ref 13.0–17.0)

## 2013-05-06 SURGERY — ROBOTIC ASSISTED LAPAROSCOPIC RADICAL PROSTATECTOMY LEVEL 3
Anesthesia: General | Site: Pelvis | Wound class: Clean Contaminated

## 2013-05-06 MED ORDER — SUFENTANIL CITRATE 50 MCG/ML IV SOLN
INTRAVENOUS | Status: DC | PRN
  Administered 2013-05-06: 5 ug via INTRAVENOUS
  Administered 2013-05-06: 15 ug via INTRAVENOUS
  Administered 2013-05-06: 10 ug via INTRAVENOUS
  Administered 2013-05-06: 5 ug via INTRAVENOUS
  Administered 2013-05-06 (×3): 10 ug via INTRAVENOUS

## 2013-05-06 MED ORDER — MEPERIDINE HCL 50 MG/ML IJ SOLN: 6.2500 mg | INTRAMUSCULAR | Status: DC | PRN

## 2013-05-06 MED ORDER — STERILE WATER FOR IRRIGATION IR SOLN
Status: DC | PRN
  Administered 2013-05-06: 3000 mL

## 2013-05-06 MED ORDER — DOCUSATE SODIUM 100 MG PO CAPS: 100.0000 mg | ORAL_CAPSULE | Freq: Two times a day (BID) | ORAL | Status: DC

## 2013-05-06 MED ORDER — HYDROMORPHONE HCL PF 1 MG/ML IJ SOLN
0.2500 mg | INTRAMUSCULAR | Status: DC | PRN
  Administered 2013-05-06 (×2): 0.5 mg via INTRAVENOUS

## 2013-05-06 MED ORDER — SODIUM CHLORIDE 0.9 % IR SOLN
Status: DC | PRN
  Administered 2013-05-06: 200 mL via INTRAVESICAL

## 2013-05-06 MED ORDER — NEOSTIGMINE METHYLSULFATE 1 MG/ML IJ SOLN
INTRAMUSCULAR | Status: DC | PRN
  Administered 2013-05-06: 4 mg via INTRAVENOUS

## 2013-05-06 MED ORDER — KCL IN DEXTROSE-NACL 20-5-0.45 MEQ/L-%-% IV SOLN
INTRAVENOUS | Status: DC
  Administered 2013-05-06 – 2013-05-07 (×2): via INTRAVENOUS
  Filled 2013-05-06 (×4): qty 1000

## 2013-05-06 MED ORDER — ONDANSETRON HCL 4 MG/2ML IJ SOLN: 4.0000 mg | INTRAMUSCULAR | Status: DC | PRN

## 2013-05-06 MED ORDER — HYDROCODONE-ACETAMINOPHEN 5-325 MG PO TABS: 1.0000 | ORAL_TABLET | Freq: Four times a day (QID) | ORAL | Status: DC | PRN

## 2013-05-06 MED ORDER — ACETAMINOPHEN 325 MG PO TABS: 650.0000 mg | ORAL_TABLET | ORAL | Status: DC | PRN

## 2013-05-06 MED ORDER — SODIUM CHLORIDE 0.9 % IV BOLUS (SEPSIS)
1000.0000 mL | Freq: Once | INTRAVENOUS | Status: AC
  Administered 2013-05-06: 1000 mL via INTRAVENOUS

## 2013-05-06 MED ORDER — MENTHOL 3 MG MT LOZG: 1.0000 | LOZENGE | OROMUCOSAL | Status: DC | PRN

## 2013-05-06 MED ORDER — CEFAZOLIN SODIUM-DEXTROSE 2-3 GM-% IV SOLR
2.0000 g | INTRAVENOUS | Status: AC
  Administered 2013-05-06: 2 g via INTRAVENOUS

## 2013-05-06 MED ORDER — LIDOCAINE HCL 4 % MT SOLN
OROMUCOSAL | Status: DC | PRN
  Administered 2013-05-06: 4 mL via TOPICAL

## 2013-05-06 MED ORDER — MIDAZOLAM HCL 5 MG/5ML IJ SOLN
INTRAMUSCULAR | Status: DC | PRN
  Administered 2013-05-06: 2 mg via INTRAVENOUS
  Administered 2013-05-06: 1 mg via INTRAVENOUS

## 2013-05-06 MED ORDER — LACTATED RINGERS IV SOLN
INTRAVENOUS | Status: DC | PRN
  Administered 2013-05-06: 12:00:00

## 2013-05-06 MED ORDER — CIPROFLOXACIN HCL 500 MG PO TABS: 500.0000 mg | ORAL_TABLET | Freq: Two times a day (BID) | ORAL | Status: DC

## 2013-05-06 MED ORDER — PROPOFOL 10 MG/ML IV BOLUS
INTRAVENOUS | Status: DC | PRN
  Administered 2013-05-06: 30 mg via INTRAVENOUS
  Administered 2013-05-06: 200 mg via INTRAVENOUS

## 2013-05-06 MED ORDER — BUPIVACAINE-EPINEPHRINE 0.25% -1:200000 IJ SOLN
INTRAMUSCULAR | Status: DC | PRN
  Administered 2013-05-06: 30 mL

## 2013-05-06 MED ORDER — MORPHINE SULFATE 2 MG/ML IJ SOLN
2.0000 mg | INTRAMUSCULAR | Status: DC | PRN
  Administered 2013-05-06: 4 mg via INTRAVENOUS
  Filled 2013-05-06: qty 2

## 2013-05-06 MED ORDER — CEFAZOLIN SODIUM-DEXTROSE 2-3 GM-% IV SOLR
INTRAVENOUS | Status: AC
  Filled 2013-05-06: qty 50

## 2013-05-06 MED ORDER — DOCUSATE SODIUM 100 MG PO CAPS
100.0000 mg | ORAL_CAPSULE | Freq: Two times a day (BID) | ORAL | Status: DC
  Administered 2013-05-06 – 2013-05-07 (×2): 100 mg via ORAL
  Filled 2013-05-06 (×3): qty 1

## 2013-05-06 MED ORDER — OXYCODONE HCL 5 MG/5ML PO SOLN: 5.0000 mg | Freq: Once | ORAL | Status: DC | PRN

## 2013-05-06 MED ORDER — ONDANSETRON HCL 4 MG/2ML IJ SOLN
INTRAMUSCULAR | Status: DC | PRN
  Administered 2013-05-06: 4 mg via INTRAVENOUS

## 2013-05-06 MED ORDER — ROCURONIUM BROMIDE 100 MG/10ML IV SOLN
INTRAVENOUS | Status: DC | PRN
  Administered 2013-05-06: 60 mg via INTRAVENOUS
  Administered 2013-05-06 (×3): 10 mg via INTRAVENOUS

## 2013-05-06 MED ORDER — PROMETHAZINE HCL 25 MG/ML IJ SOLN: 6.2500 mg | INTRAMUSCULAR | Status: DC | PRN

## 2013-05-06 MED ORDER — KETOROLAC TROMETHAMINE 15 MG/ML IJ SOLN
15.0000 mg | Freq: Four times a day (QID) | INTRAMUSCULAR | Status: DC
  Administered 2013-05-06 – 2013-05-07 (×4): 15 mg via INTRAVENOUS
  Filled 2013-05-06 (×6): qty 1

## 2013-05-06 MED ORDER — INDIGOTINDISULFONATE SODIUM 8 MG/ML IJ SOLN
INTRAMUSCULAR | Status: DC | PRN
  Administered 2013-05-06 (×2): 5 mL via INTRAVENOUS

## 2013-05-06 MED ORDER — ACETAMINOPHEN 10 MG/ML IV SOLN: 1000.0000 mg | Freq: Once | INTRAVENOUS | Status: DC | PRN

## 2013-05-06 MED ORDER — GLYCOPYRROLATE 0.2 MG/ML IJ SOLN
INTRAMUSCULAR | Status: DC | PRN
  Administered 2013-05-06: .8 mg via INTRAVENOUS

## 2013-05-06 MED ORDER — ACETAMINOPHEN 10 MG/ML IV SOLN
INTRAVENOUS | Status: DC | PRN
  Administered 2013-05-06: 1000 mg via INTRAVENOUS

## 2013-05-06 MED ORDER — HYDROMORPHONE HCL PF 1 MG/ML IJ SOLN
INTRAMUSCULAR | Status: DC | PRN
  Administered 2013-05-06 (×2): 1 mg via INTRAVENOUS

## 2013-05-06 MED ORDER — OXYCODONE HCL 5 MG PO TABS: 5.0000 mg | ORAL_TABLET | Freq: Once | ORAL | Status: DC | PRN

## 2013-05-06 MED ORDER — PHENOL 1.4 % MT LIQD: 1.0000 | OROMUCOSAL | Status: DC | PRN

## 2013-05-06 MED ORDER — DIPHENHYDRAMINE HCL 12.5 MG/5ML PO ELIX: 12.5000 mg | ORAL_SOLUTION | Freq: Four times a day (QID) | ORAL | Status: DC | PRN

## 2013-05-06 MED ORDER — BISACODYL 10 MG RE SUPP
10.0000 mg | Freq: Every day | RECTAL | Status: DC | PRN
  Filled 2013-05-06: qty 1

## 2013-05-06 MED ORDER — FENTANYL CITRATE 0.05 MG/ML IJ SOLN
INTRAMUSCULAR | Status: DC | PRN
  Administered 2013-05-06 (×2): 50 ug via INTRAVENOUS

## 2013-05-06 MED ORDER — PREDNISOLONE ACETATE 1 % OP SUSP
1.0000 [drp] | Freq: Two times a day (BID) | OPHTHALMIC | Status: DC
  Administered 2013-05-07: 1 [drp] via OPHTHALMIC
  Filled 2013-05-06: qty 1

## 2013-05-06 MED ORDER — LACTATED RINGERS IV SOLN
INTRAVENOUS | Status: DC
  Administered 2013-05-06 (×3): via INTRAVENOUS

## 2013-05-06 MED ORDER — DEXAMETHASONE SODIUM PHOSPHATE 10 MG/ML IJ SOLN
INTRAMUSCULAR | Status: DC | PRN
  Administered 2013-05-06: 10 mg via INTRAVENOUS

## 2013-05-06 MED ORDER — LIDOCAINE HCL (CARDIAC) 20 MG/ML IV SOLN
INTRAVENOUS | Status: DC | PRN
  Administered 2013-05-06: 50 mg via INTRAVENOUS

## 2013-05-06 MED ORDER — HYDROMORPHONE HCL PF 1 MG/ML IJ SOLN
INTRAMUSCULAR | Status: AC
  Filled 2013-05-06: qty 1

## 2013-05-06 MED ORDER — ACETAMINOPHEN 10 MG/ML IV SOLN
INTRAVENOUS | Status: AC
  Filled 2013-05-06: qty 100

## 2013-05-06 MED ORDER — CEFAZOLIN SODIUM 1-5 GM-% IV SOLN
1.0000 g | Freq: Three times a day (TID) | INTRAVENOUS | Status: AC
  Administered 2013-05-06 – 2013-05-07 (×2): 1 g via INTRAVENOUS
  Filled 2013-05-06 (×2): qty 50

## 2013-05-06 MED ORDER — ZOLPIDEM TARTRATE 5 MG PO TABS: 5.0000 mg | ORAL_TABLET | Freq: Every evening | ORAL | Status: DC | PRN

## 2013-05-06 MED ORDER — EPHEDRINE SULFATE 50 MG/ML IJ SOLN
INTRAMUSCULAR | Status: DC | PRN
  Administered 2013-05-06: 5 mg via INTRAVENOUS

## 2013-05-06 MED ORDER — BELLADONNA ALKALOIDS-OPIUM 16.2-60 MG RE SUPP: 1.0000 | Freq: Four times a day (QID) | RECTAL | Status: DC | PRN

## 2013-05-06 MED ORDER — DIPHENHYDRAMINE HCL 50 MG/ML IJ SOLN: 12.5000 mg | Freq: Four times a day (QID) | INTRAMUSCULAR | Status: DC | PRN

## 2013-05-06 SURGICAL SUPPLY — 41 items
CANISTER SUCTION 2500CC (MISCELLANEOUS) ×3 IMPLANT
CATH FOLEY 2WAY SLVR 18FR 30CC (CATHETERS) ×3 IMPLANT
CATH ROBINSON RED A/P 16FR (CATHETERS) ×3 IMPLANT
CATH ROBINSON RED A/P 8FR (CATHETERS) ×3 IMPLANT
CATH TIEMANN FOLEY 18FR 5CC (CATHETERS) ×3 IMPLANT
CHLORAPREP W/TINT 26ML (MISCELLANEOUS) ×3 IMPLANT
CLIP LIGATING HEM O LOK PURPLE (MISCELLANEOUS) ×6 IMPLANT
CLOTH BEACON ORANGE TIMEOUT ST (SAFETY) ×3 IMPLANT
COVER SURGICAL LIGHT HANDLE (MISCELLANEOUS) ×3 IMPLANT
COVER TIP SHEARS 8 DVNC (MISCELLANEOUS) ×2 IMPLANT
COVER TIP SHEARS 8MM DA VINCI (MISCELLANEOUS) ×1
CUTTER ECHEON FLEX ENDO 45 340 (ENDOMECHANICALS) ×3 IMPLANT
DECANTER SPIKE VIAL GLASS SM (MISCELLANEOUS) ×2 IMPLANT
DRAPE SURG IRRIG POUCH 19X23 (DRAPES) ×3 IMPLANT
DRSG TEGADERM 2-3/8X2-3/4 SM (GAUZE/BANDAGES/DRESSINGS) ×12 IMPLANT
DRSG TEGADERM 4X4.75 (GAUZE/BANDAGES/DRESSINGS) ×6 IMPLANT
DRSG TEGADERM 6X8 (GAUZE/BANDAGES/DRESSINGS) ×6 IMPLANT
ELECT REM PT RETURN 9FT ADLT (ELECTROSURGICAL) ×3
ELECTRODE REM PT RTRN 9FT ADLT (ELECTROSURGICAL) ×2 IMPLANT
GAUZE SPONGE 2X2 8PLY STRL LF (GAUZE/BANDAGES/DRESSINGS) ×2 IMPLANT
GLOVE BIO SURGEON STRL SZ 6.5 (GLOVE) ×3 IMPLANT
GLOVE BIOGEL M STRL SZ7.5 (GLOVE) ×6 IMPLANT
GOWN STRL NON-REIN LRG LVL3 (GOWN DISPOSABLE) ×4 IMPLANT
GOWN STRL REIN XL XLG (GOWN DISPOSABLE) ×6 IMPLANT
HOLDER FOLEY CATH W/STRAP (MISCELLANEOUS) ×3 IMPLANT
IV LACTATED RINGERS 1000ML (IV SOLUTION) ×3 IMPLANT
KIT ACCESSORY DA VINCI DISP (KITS) ×1
KIT ACCESSORY DVNC DISP (KITS) ×2 IMPLANT
NDL SAFETY ECLIPSE 18X1.5 (NEEDLE) ×2 IMPLANT
NEEDLE HYPO 18GX1.5 SHARP (NEEDLE) ×3
PACK ROBOT UROLOGY CUSTOM (CUSTOM PROCEDURE TRAY) ×3 IMPLANT
RELOAD GREEN ECHELON 45 (STAPLE) ×3 IMPLANT
SET TUBE IRRIG SUCTION NO TIP (IRRIGATION / IRRIGATOR) ×3 IMPLANT
SOLUTION ELECTROLUBE (MISCELLANEOUS) ×3 IMPLANT
SPONGE GAUZE 2X2 STER 10/PKG (GAUZE/BANDAGES/DRESSINGS)
SUT ETHILON 3 0 PS 1 (SUTURE) ×3 IMPLANT
SUT VICRYL 0 UR6 27IN ABS (SUTURE) ×6 IMPLANT
SYR 27GX1/2 1ML LL SAFETY (SYRINGE) ×3 IMPLANT
TOWEL OR 17X26 10 PK STRL BLUE (TOWEL DISPOSABLE) ×3 IMPLANT
TOWEL OR NON WOVEN STRL DISP B (DISPOSABLE) ×3 IMPLANT
WATER STERILE IRR 1500ML POUR (IV SOLUTION) ×6 IMPLANT

## 2013-05-06 NOTE — Transfer of Care (Signed)
Immediate Anesthesia Transfer of Care Note  Patient: David King  Procedure(s) Performed: Procedure(s): ROBOTIC ASSISTED LAPAROSCOPIC RADICAL PROSTATECTOMY LEVEL 3 (N/A) LYMPHADENECTOMY (Bilateral)  Patient Location: PACU  Anesthesia Type:General  Level of Consciousness: awake, alert , oriented and patient cooperative  Airway & Oxygen Therapy: Patient Spontanous Breathing and Patient connected to face mask oxygen  Post-op Assessment: Report given to PACU RN, Post -op Vital signs reviewed and stable and Patient moving all extremities X 4  Post vital signs: stable  Complications: No apparent anesthesia complications

## 2013-05-06 NOTE — Progress Notes (Signed)
Patient ID: David King, male   DOB: 1956/07/27, 57 y.o.   MRN: [id number]  Post-op note  Subjective: The patient is doing well.  No complaints.  Objective: Vital signs in last 24 hours: Temp:  [97.7 F (36.5 C)-98.9 F (37.2 C)] 97.8 F (36.6 C) (05/08 1430) Pulse Rate:  [74-103] 74 (05/08 1456) Resp:  [9-21] 14 (05/08 1456) BP: (114-131)/(68-75) 124/75 mmHg (05/08 1445) SpO2:  [97 %-100 %] 99 % (05/08 1456)  Intake/Output from previous day:   Intake/Output this shift: Total I/O In: 3700 [I.V.:2700; IV Piggyback:1000] Out: 165 [Urine:100; Drains:15; Blood:50]  Physical Exam:  General: Alert and oriented. Abdomen: Soft, Nondistended. Incisions: Clean and dry.  Lab Results:  Recent Labs  05/06/13 1356  HGB 12.5*  HCT 37.4*    Assessment/Plan: POD#0   1) Continue to monitor   Moody Bruins. MD   LOS: 0 days   Partick Musselman,LES 05/06/2013, 3:02 PM

## 2013-05-06 NOTE — Op Note (Signed)
Preoperative diagnosis: Clinically localized adenocarcinoma of the prostate (clinical stage T1c N0 M0)  Postoperative diagnosis: Clinically localized adenocarcinoma of the prostate (clinical stage T1c N0 M0)  Procedure:  1. Robotic assisted laparoscopic radical prostatectomy (non nerve sparing) 2. Bilateral robotic assisted laparoscopic pelvic lymphadenectomy  Surgeon: Moody Bruins. M.D.  Assistant(s): Pecola Leisure, PA-C  Anesthesia: General  Complications: None  EBL: 50 mL  IVF:  2000 mL crystalloid  Specimens: 1. Prostate and seminal vesicles 2. Right pelvic lymph nodes 3. Left pelvic lymph nodes 4. Periprostatic lymph nodes  Disposition of specimens: Pathology  Drains: 1. 20 Fr coude catheter 2. # 19 Blake pelvic drain  Indication: David King is a 57 y.o. year old patient with clinically localized prostate cancer.  After a thorough review of the management options for treatment of prostate cancer, he elected to proceed with surgical therapy and the above procedure(s).  We have discussed the potential benefits and risks of the procedure, side effects of the proposed treatment, the likelihood of the patient achieving the goals of the procedure, and any potential problems that might occur during the procedure or recuperation. Informed consent has been obtained.  Description of procedure:  The patient was taken to the operating room and a general anesthetic was administered. He was given preoperative antibiotics, placed in the dorsal lithotomy position, and prepped and draped in the usual sterile fashion. Next a preoperative timeout was performed. A urethral catheter was placed into the bladder and a site was selected near the umbilicus for placement of the camera port. This was placed using a standard open Hassan technique which allowed entry into the peritoneal cavity under direct vision and without difficulty. A 12 mm port was placed and a pneumoperitoneum  established. The camera was then used to inspect the abdomen and there was no evidence of any intra-abdominal injuries or other abnormalities. The remaining abdominal ports were then placed. 8 mm robotic ports were placed in the right lower quadrant, left lower quadrant, and far left lateral abdominal wall. A 5 mm port was placed in the right upper quadrant and a 12 mm port was placed in the right lateral abdominal wall for laparoscopic assistance. All ports were placed under direct vision without difficulty. The surgical cart was then docked.   Utilizing the cautery scissors, the bladder was reflected posteriorly allowing entry into the space of Retzius and identification of the endopelvic fascia and prostate. The periprostatic fat was then removed from the prostate allowing full exposure of the endopelvic fascia. The endopelvic fascia was then incised from the apex back to the base of the prostate bilaterally and the underlying levator muscle fibers were swept laterally off the prostate thereby isolating the dorsal venous complex. The dorsal vein was then stapled and divided with a 45 mm Flex Echelon stapler. Attention then turned to the bladder neck which was divided anteriorly thereby allowing entry into the bladder and exposure of the urethral catheter. The catheter balloon was deflated and the catheter was brought into the operative field and used to retract the prostate anteriorly. The posterior bladder neck was then examined and was divided allowing further dissection between the bladder and prostate posteriorly until the vasa deferentia and seminal vessels were identified. The vasa deferentia were isolated, divided, and lifted anteriorly. The seminal vesicles were dissected down to their tips with care to control the seminal vascular arterial blood supply. These structures were then lifted anteriorly and the space between Denonvillier's fascia and the anterior rectum was  developed with a combination of  sharp and blunt dissection. This isolated the vascular pedicles of the prostate.  A wide non nerve sparing dissection was performed with Weck clips used to ligate the vascular pedicles of the prostate bilaterally. The vascular pedicles of the prostate were then divided.  The urethra was then sharply transected allowing the prostate specimen to be disarticulated. The pelvis was copiously irrigated and hemostasis was ensured. There was no evidence for rectal injury.  Attention then turned to the right pelvic sidewall. The fibrofatty tissue between the external iliac vein, confluence of the iliac vessels, hypogastric artery, and Cooper's ligament was dissected free from the pelvic sidewall with care to preserve the obturator nerve. Weck clips were used for lymphostasis and hemostasis. An identical procedure was performed on the contralateral side and the lymphatic packets were removed for permanent pathologic analysis.  Attention then turned to the urethral anastomosis. A 2-0 Vicryl slip knot was placed between Denonvillier's fascia, the posterior bladder neck, and the posterior urethra to reapproximate these structures. A double-armed 3-0 Monocryl suture was then used to perform a 360 running tension-free anastomosis between the bladder neck and urethra. A new urethral catheter was then placed into the bladder and irrigated. There were no blood clots within the bladder and the anastomosis appeared to be watertight. A #19 Blake drain was then brought through the left lateral 8 mm port site and positioned appropriately within the pelvis. It was secured to the skin with a nylon suture. The surgical cart was then undocked. The right lateral 12 mm port site was closed at the fascial level with a 0 Vicryl suture placed laparoscopically. All remaining ports were then removed under direct vision. The prostate specimen was removed intact within the Endopouch retrieval bag via the periumbilical camera port site. This  fascial opening was closed with two running 0 Vicryl sutures. 0.25% Marcaine was then injected into all port sites and all incisions were reapproximated at the skin level with staples. Sterile dressings were applied. The patient appeared to tolerate the procedure well and without complications. The patient was able to be extubated and transferred to the recovery unit in satisfactory condition.  Moody Bruins MD

## 2013-05-06 NOTE — Anesthesia Preprocedure Evaluation (Addendum)
Anesthesia Evaluation  Patient identified by MRN, date of birth, ID band Patient awake    Reviewed: Allergy & Precautions, H&P , NPO status , Patient's Chart, lab work & pertinent test results  Airway Mallampati: II TM Distance: >3 FB Neck ROM: Full    Dental no notable dental hx. (+) Dental Advisory Given   Pulmonary Current Smoker,  breath sounds clear to auscultation  Pulmonary exam normal       Cardiovascular negative cardio ROS  Rhythm:Regular Rate:Normal     Neuro/Psych  Headaches, PSYCHIATRIC DISORDERS Anxiety  Neuromuscular disease    GI/Hepatic negative GI ROS, Neg liver ROS,   Endo/Other  negative endocrine ROS  Renal/GU Renal disease     Musculoskeletal negative musculoskeletal ROS (+)   Abdominal   Peds  Hematology negative hematology ROS (+)   Anesthesia Other Findings   Reproductive/Obstetrics                          Anesthesia Physical  Anesthesia Plan  ASA: II  Anesthesia Plan: General   Post-op Pain Management:    Induction: Intravenous  Airway Management Planned: Oral ETT  Additional Equipment:   Intra-op Plan:   Post-operative Plan: Extubation in OR  Informed Consent: I have reviewed the patients History and Physical, chart, labs and discussed the procedure including the risks, benefits and alternatives for the proposed anesthesia with the patient or authorized representative who has indicated his/her understanding and acceptance.   Dental advisory given  Plan Discussed with: CRNA and Surgeon  Anesthesia Plan Comments:        Anesthesia Quick Evaluation

## 2013-05-06 NOTE — Anesthesia Postprocedure Evaluation (Signed)
Anesthesia Post Note  Patient: David King  Procedure(s) Performed: Procedure(s) (LRB): ROBOTIC ASSISTED LAPAROSCOPIC RADICAL PROSTATECTOMY LEVEL 3 (N/A) LYMPHADENECTOMY (Bilateral)  Anesthesia type: General  Patient location: PACU  Post pain: Pain level controlled  Post assessment: Post-op Vital signs reviewed  Last Vitals: BP 121/76  Pulse 67  Temp(Src) 36.3 C (Oral)  Resp 14  Ht 5\' 9"  (1.753 m)  Wt 170 lb (77.111 kg)  BMI 25.09 kg/m2  SpO2 97%  Post vital signs: Reviewed  Level of consciousness: sedated  Complications: No apparent anesthesia complications

## 2013-05-07 ENCOUNTER — Encounter (HOSPITAL_COMMUNITY): Payer: Self-pay | Admitting: Urology

## 2013-05-07 LAB — HEMOGLOBIN AND HEMATOCRIT, BLOOD
HCT: 36.8 % — ABNORMAL LOW (ref 39.0–52.0)
Hemoglobin: 12.4 g/dL — ABNORMAL LOW (ref 13.0–17.0)

## 2013-05-07 LAB — GLUCOSE, CAPILLARY: Glucose-Capillary: 110 mg/dL — ABNORMAL HIGH (ref 70–99)

## 2013-05-07 MED ORDER — HYDROCODONE-ACETAMINOPHEN 5-325 MG PO TABS: 1.0000 | ORAL_TABLET | Freq: Four times a day (QID) | ORAL | Status: DC | PRN

## 2013-05-07 MED ORDER — BISACODYL 10 MG RE SUPP
10.0000 mg | Freq: Once | RECTAL | Status: AC
  Administered 2013-05-07: 10 mg via RECTAL

## 2013-05-07 NOTE — Progress Notes (Signed)
Patient ID: David King, male   DOB: 09-Mar-1956, 57 y.o.   MRN: [id number]  1 Day Post-Op Subjective: The patient is doing well.  No nausea or vomiting. Pain is adequately controlled.  Objective: Vital signs in last 24 hours: Temp:  [97.3 F (36.3 C)-98.9 F (37.2 C)] 98.5 F (36.9 C) (05/09 0554) Pulse Rate:  [65-103] 65 (05/09 0554) Resp:  [9-21] 16 (05/09 0554) BP: (107-132)/(66-80) 120/70 mmHg (05/09 0554) SpO2:  [93 %-100 %] 96 % (05/09 0554) Weight:  [77.111 kg (170 lb)] 77.111 kg (170 lb) (05/08 1500)  Intake/Output from previous day: 05/08 0701 - 05/09 0700 In: 4012.5 [I.V.:3012.5; IV Piggyback:1000] Out: 3868 [Urine:3400; Drains:418; Blood:50] Intake/Output this shift:    Physical Exam:  General: Alert and oriented. CV: RRR Lungs: Clear bilaterally. GI: Soft, Nondistended. Incisions: Dressings intact. Urine: Clear Extremities: Nontender, no erythema, no edema.  Lab Results:  Recent Labs  05/06/13 1356 05/07/13 0410  HGB 12.5* 12.4*  HCT 37.4* 36.8*      Assessment/Plan: POD# 1 s/p robotic prostatectomy.  1) SL IVF 2) Ambulate, Incentive spirometry 3) Transition to oral pain medication 4) Dulcolax suppository 5) D/C pelvic drain 6) Plan for likely discharge later today   Moody Bruins. MD   LOS: 1 day   Noe Pittsley,LES 05/07/2013, 7:06 AM

## 2013-05-07 NOTE — Progress Notes (Signed)
Pt ambulated in hall around 4 west unit. Pt tolerated well. No complaints of pain, some lightheadedness.  Pt assisted back to bed and is comfortable. Will continue to monitor pt.

## 2013-05-07 NOTE — Discharge Summary (Signed)
  Date of admission: 05/06/2013  Date of discharge: 05/07/2013  Admission diagnosis: Prostate Cancer  Discharge diagnosis: Prostate Cancer  History and Physical: For full details, please see admission history and physical. Briefly, David King is a 57 y.o. gentleman with localized prostate cancer.  After discussing management/treatment options, he elected to proceed with surgical treatment.  Hospital Course: RYLEND PIETRZAK was taken to the operating room on 05/06/2013 and underwent a robotic assisted laparoscopic radical prostatectomy. He tolerated this procedure well and without complications. Postoperatively, he was able to be transferred to a regular hospital room following recovery from anesthesia.  He was able to begin ambulating the night of surgery. He remained hemodynamically stable overnight.  He had excellent urine output with appropriately minimal output from his pelvic drain and his pelvic drain was removed on POD #1.  He was transitioned to oral pain medication, tolerated a clear liquid diet, and had met all discharge criteria and was able to be discharged home later on POD#1.  Laboratory values:  Recent Labs  05/06/13 1356 05/07/13 0410  HGB 12.5* 12.4*  HCT 37.4* 36.8*    Disposition: Home  Discharge instruction: He was instructed to be ambulatory but to refrain from heavy lifting, strenuous activity, or driving. He was instructed on urethral catheter care.  Discharge medications:     Medication List    STOP taking these medications       ibuprofen 200 MG tablet  Commonly known as:  ADVIL,MOTRIN      TAKE these medications       ciprofloxacin 500 MG tablet  Commonly known as:  CIPRO  Take 1 tablet (500 mg total) by mouth 2 (two) times daily. Start day prior to office visit for foley removal     docusate sodium 100 MG capsule  Commonly known as:  COLACE  Take 1 capsule (100 mg total) by mouth 2 (two) times daily.     HYDROcodone-acetaminophen 5-325 MG per tablet   Commonly known as:  NORCO  Take 1-2 tablets by mouth every 6 (six) hours as needed for pain.     prednisoLONE acetate 1 % ophthalmic suspension  Commonly known as:  PRED FORTE  Place 1 drop into the left eye 2 (two) times daily.        Followup: He will followup in 1 week for catheter removal and to discuss his surgical pathology results.

## 2013-05-07 NOTE — Progress Notes (Signed)
Patient discharged home with wife, discharge instructions given and explained to patient/wife and they verbalized understanding, denies any pain/distress. Surgical incision dressing intact with old blood, no other wound noted. Additional dressing given to patient for home care as needed. Educated patient on foley care at home. Patient tolerated diet and ambulated without any problem. Accompanied home by wife, transported to the car by staff via wheelchair.

## 2013-05-07 NOTE — Progress Notes (Signed)
UR completed 

## 2013-07-23 ENCOUNTER — Other Ambulatory Visit: Payer: Self-pay | Admitting: Family Medicine

## 2013-07-23 DIAGNOSIS — M5116 Intervertebral disc disorders with radiculopathy, lumbar region: Secondary | ICD-10-CM

## 2013-07-27 ENCOUNTER — Ambulatory Visit
Admission: RE | Admit: 2013-07-27 | Discharge: 2013-07-27 | Disposition: A | Discharge status: Home / Self Care | Payer: PRIVATE HEALTH INSURANCE | Source: Ambulatory Visit | Attending: Family Medicine | Admitting: Family Medicine

## 2013-07-27 DIAGNOSIS — M5116 Intervertebral disc disorders with radiculopathy, lumbar region: Secondary | ICD-10-CM

## 2013-10-07 DIAGNOSIS — M48061 Spinal stenosis, lumbar region without neurogenic claudication: Secondary | ICD-10-CM | POA: Insufficient documentation

## 2017-11-03 ENCOUNTER — Emergency Department (HOSPITAL_COMMUNITY): Payer: PRIVATE HEALTH INSURANCE

## 2017-11-03 ENCOUNTER — Emergency Department (HOSPITAL_COMMUNITY)
Admission: EM | Admit: 2017-11-03 | Discharge: 2017-11-03 | Disposition: A | Discharge status: Home / Self Care | Payer: PRIVATE HEALTH INSURANCE | Attending: Emergency Medicine | Admitting: Emergency Medicine

## 2017-11-03 ENCOUNTER — Encounter (HOSPITAL_COMMUNITY): Payer: Self-pay | Admitting: Emergency Medicine

## 2017-11-03 DIAGNOSIS — R0602 Shortness of breath: Secondary | ICD-10-CM | POA: Insufficient documentation

## 2017-11-03 DIAGNOSIS — Z87891 Personal history of nicotine dependence: Secondary | ICD-10-CM | POA: Insufficient documentation

## 2017-11-03 DIAGNOSIS — F419 Anxiety disorder, unspecified: Secondary | ICD-10-CM | POA: Diagnosis not present

## 2017-11-03 DIAGNOSIS — Z8546 Personal history of malignant neoplasm of prostate: Secondary | ICD-10-CM | POA: Insufficient documentation

## 2017-11-03 LAB — I-STAT TROPONIN, ED: Troponin i, poc: 0.04 ng/mL (ref 0.00–0.08)

## 2017-11-03 LAB — CBC WITH DIFFERENTIAL/PLATELET
Basophils Absolute: 0.1 10*3/uL (ref 0.0–0.1)
Basophils Relative: 1 %
Eosinophils Absolute: 0.1 10*3/uL (ref 0.0–0.7)
Eosinophils Relative: 1 %
HCT: 45.7 % (ref 39.0–52.0)
Hemoglobin: 16 g/dL (ref 13.0–17.0)
Lymphocytes Relative: 30 %
Lymphs Abs: 2.7 10*3/uL (ref 0.7–4.0)
MCH: 34.9 pg — ABNORMAL HIGH (ref 26.0–34.0)
MCHC: 35 g/dL (ref 30.0–36.0)
MCV: 99.8 fL (ref 78.0–100.0)
Monocytes Absolute: 0.9 10*3/uL (ref 0.1–1.0)
Monocytes Relative: 10 %
Neutro Abs: 5.3 10*3/uL (ref 1.7–7.7)
Neutrophils Relative %: 58 %
Platelets: 257 10*3/uL (ref 150–400)
RBC: 4.58 MIL/uL (ref 4.22–5.81)
RDW: 13.1 % (ref 11.5–15.5)
WBC: 9 10*3/uL (ref 4.0–10.5)

## 2017-11-03 LAB — BASIC METABOLIC PANEL
Anion gap: 12 (ref 5–15)
BUN: 13 mg/dL (ref 6–20)
CO2: 24 mmol/L (ref 22–32)
Calcium: 9.4 mg/dL (ref 8.9–10.3)
Chloride: 101 mmol/L (ref 101–111)
Creatinine, Ser: 0.96 mg/dL (ref 0.61–1.24)
GFR calc Af Amer: >60 mL/min (ref >60)
GFR calc non Af Amer: >60 mL/min (ref >60)
Glucose, Bld: 100 mg/dL — ABNORMAL HIGH (ref 65–99)
Potassium: 4 mmol/L (ref 3.5–5.1)
Sodium: 137 mmol/L (ref 135–145)

## 2017-11-03 MED ORDER — ALBUTEROL SULFATE HFA 108 (90 BASE) MCG/ACT IN AERS
2.0000 | INHALATION_SPRAY | Freq: Once | RESPIRATORY_TRACT | Status: AC
  Administered 2017-11-03: 2 via RESPIRATORY_TRACT
  Filled 2017-11-03: qty 6.7

## 2017-11-03 NOTE — ED Provider Notes (Signed)
[facility] DEPT Provider Note   CSN: [id number] Arrival date & time: 11/03/17  1254     History   Chief Complaint Chief Complaint  Patient presents with  . Shortness of Breath  . Cough    HPI David King is a 61 y.o. male.  HPI  61 year old male presents with shortness of breath.  He has a history of prostate cancer with his prostate removed about 4 years ago.  He was previously a chronic smoker that smoked 2 or 3 packs a day since he was 61 years old.  He quit in January 2018.  Since quitting he has been feeling short of breath.  This is a daily feeling that worsens with walking but is also present at rest.  He also feels like his neck muscles are sore.  He denies any chest pain.  He feels "foggy headed".  No leg swelling. he intimately has been getting cramps during this time as well where he just gets a Charlie horse once or twice a week in multiple areas.  This has been slowly progressive.  Minimal to no cough.  The patient called his doctor's office and the nurse over the phone told him to come to the ER because he was complaining of shortness of breath.  Past Medical History:  Diagnosis Date  . Anxiety    new dx  . Arthritis    lumbar  . Diverticulosis   . Elevated PSA   . Headache(784.0)    MIGRAINES  . Iritis    CHRONIC IN LEFT EYE - SOME VISIAL IMPAIRMENT IN LEFT EYE  . Neuromuscular disorder ([place])    left leg/foot,pinched siactic nerve  . Pain    PAIN LEFT HIP AND DOWN LT LEG WITH NUMBNESS IN LEFT LEG--PT STATES SCIATIC NERVE IMPINGEMENT - PT PLANS BACK IN THE NEAR SURGERY.  . 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 Renal cysts, acquired, bilateral 03/19/13   several simple , CT  . Urinary frequency    AND NOCTURIA    Patient Active Problem List   Diagnosis Date Noted  . Prostate cancer [facility])     Past Surgical History:  Procedure Laterality Date  . BACK SURGERY    . CATARACT EXTRACTION W/ INTRAOCULAR LENS  IMPLANT Left   . EYE SURGERY     RETINAL SURGERY LEFT EYE  . INGUINAL HERNIA REPAIR Bilateral AGE 33  . REMOVAL BURSA [place], LEFT ELBOW  1996  . TONSILLECTOMY     as achild       Home Medications    Prior to Admission medications   Medication Sig Start Date End Date Taking? Authorizing Provider  Apremilast (OTEZLA) 30 MG TABS Take 30 mg 2 (two) times daily by mouth.   Yes [provider]  ciprofloxacin (CIPRO) 500 MG tablet Take 1 tablet (500 mg total) by mouth 2 (two) times daily. Start day prior to office visit for foley removal Patient not taking: Reported on 11/03/2017 05/06/13   Debbrah Alar, PA-C  docusate sodium (COLACE) 100 MG capsule Take 1 capsule (100 mg total) by mouth 2 (two) times daily. Patient not taking: Reported on 11/03/2017 05/06/13   Debbrah Alar, PA-C  HYDROcodone-acetaminophen (NORCO) 5-325 MG per tablet Take 1-2 tablets by mouth every 6 (six) hours as needed for pain. Patient not taking: Reported on 11/03/2017 05/06/13   Debbrah Alar, PA-C  prednisoLONE acetate (PRED FORTE) 1 % ophthalmic suspension Place 1 drop into the left eye 2 (two)  times daily.    [provider]    Family History Family History  Problem Relation Age of Onset  . Cancer Father        prostate cancer  . Cancer Paternal Grandfather        prostate    Social History Social History   Tobacco Use  . Smoking status: Former Smoker    Packs/day: 1.50    Years: 40.00    Pack years: 60.00    Types: Cigarettes  . Smokeless tobacco: Never Used  Substance Use Topics  . Alcohol use: Yes    Alcohol/week: 4.0 oz    Types: 8 Standard drinks or equivalent per week    Comment: 3 DRINKS A DAY  . Drug use: No     Allergies   Patient has no known allergies.   Review of Systems Review of Systems  Constitutional: Negative for fever.  Respiratory: Positive for shortness of breath. Negative for cough.   Cardiovascular: Negative for chest pain and leg swelling.    Gastrointestinal: Negative for vomiting.  Musculoskeletal: Positive for myalgias and neck pain.  Neurological: Positive for light-headedness.  All other systems reviewed and are negative.    Physical Exam Updated Vital Signs BP (!) 145/86 (BP Location: Right Arm)   Pulse 80   Temp 98.3 F (36.8 C) (Oral)   Resp 18   Ht 5\' 9"  (1.753 m)   Wt 86.2 kg (190 lb)   SpO2 95%   BMI 28.06 kg/m   Physical Exam  Constitutional: He is oriented to person, place, and time. He appears well-developed and well-nourished.  Non-toxic appearance. He does not appear ill.  HENT:  Head: Normocephalic and atraumatic.  Right Ear: External ear normal.  Left Ear: External ear normal.  Nose: Nose normal.  Mouth/Throat: Oropharynx is clear and moist.  Eyes: Right eye exhibits no discharge. Left eye exhibits no discharge.  Neck: Neck supple.  No neck swelling  Cardiovascular: Normal rate, regular rhythm and normal heart sounds.  Pulmonary/Chest: Effort normal and breath sounds normal. He has no decreased breath sounds. He has no wheezes. He has no rhonchi. He has no rales.  Abdominal: Soft. There is no tenderness.  Musculoskeletal: He exhibits no edema.       Right lower leg: He exhibits no edema.       Left lower leg: He exhibits no edema.  Neurological: He is alert and oriented to person, place, and time.  Skin: Skin is warm and dry.  Nursing note and vitals reviewed.    ED Treatments / Results  Labs (all labs ordered are listed, but only abnormal results are displayed) Labs Reviewed  BASIC METABOLIC PANEL - Abnormal; Notable for the following components:      Result Value   Glucose, Bld 100 (*)    All other components within normal limits  CBC WITH DIFFERENTIAL/PLATELET - Abnormal; Notable for the following components:   MCH 34.9 (*)    All other components within normal limits  I-STAT TROPONIN, ED    EKG  EKG Interpretation  Date/Time:  Monday November 03 2017 13:48:38  EST Ventricular Rate:  110 PR Interval:    QRS Duration: 96 QT Interval:  344 QTC Calculation: 466 R Axis:   82 Text Interpretation:  Sinus tachycardia Probable left atrial enlargement Borderline right axis deviation Minimal ST depression, lateral leads rate is faster, otherwise similar to 2014 Confirmed by Sherwood Gambler [phone]) on 11/03/2017 6:49:11 PM       Radiology  Dg Chest 2 View  Result Date: 11/03/2017 CLINICAL DATA:  Dyspnea and tightness in the neck since January 2018. EXAM: CHEST  2 VIEW COMPARISON:  04/26/2013 FINDINGS: Heart is normal in size. There is aortic atherosclerosis at the arch without aneurysmal dilatation. The lungs are slightly hyperinflated without pneumonic consolidation, CHF, effusion or pneumothorax. No dominant mass. Mild multilevel degenerative disc disease of the included thoracic and upper lumbar spine consistent with thoracolumbar spondylosis. IMPRESSION: Aortic atherosclerosis. Mild pulmonary hyperinflation without active appearing pulmonary disease. Electronically Signed   By: Ashley Royalty M.D.   On: 11/03/2017 14:01    Procedures Procedures (including critical care time)  Medications Ordered in ED Medications  albuterol (PROVENTIL HFA;VENTOLIN HFA) 108 (90 Base) MCG/ACT inhaler 2 puff (2 puffs Inhalation Given 11/03/17 1953)     Initial Impression / Assessment and Plan / ED Course  I have reviewed the triage vital signs and the nursing notes.  Pertinent labs & imaging results that were available during my care of the patient were reviewed by me and considered in my medical decision making (see chart for details).     No clear explanation for the patient's progressive shortness of breath since January.  Doubt PE, ACS, dissection.  Likely related to numerous years of smoking.  However he does not appear to have an acute bronchitis or COPD exacerbation currently.  His lungs are pretty clear.  He was given an albuterol inhaler to try at home but no  significant relief here.  Lab work unremarkable.  Chest x-ray does not show clear pathology.  Appears stable for discharge home with no hypoxia.  He was transiently tachycardic here but again my suspicion for PE over a 17-month period is quite low.  Discharge home with return precautions, follow-up with PCP and pulmonology.  Final Clinical Impressions(s) / ED Diagnoses   Final diagnoses:  Shortness of breath    ED Discharge Orders    None       Sherwood Gambler, MD 11/03/17 2233

## 2017-11-03 NOTE — ED Triage Notes (Signed)
Patient reports that he been having SOB and tightness in his neck since Jan 2018. Patient reports that he had productive cough but doesn't look at color of phlegm that comes up.

## 2017-11-10 ENCOUNTER — Encounter: Payer: Self-pay | Admitting: Pulmonary Disease

## 2017-11-10 ENCOUNTER — Institutional Professional Consult (permissible substitution): Payer: PRIVATE HEALTH INSURANCE | Admitting: Pulmonary Disease

## 2017-11-10 ENCOUNTER — Ambulatory Visit: Payer: PRIVATE HEALTH INSURANCE | Admitting: Pulmonary Disease

## 2017-11-10 VITALS — BP 122/78 | HR 103 | Ht 69.0 in | Wt 195.8 lb

## 2017-11-10 DIAGNOSIS — Z87891 Personal history of nicotine dependence: Secondary | ICD-10-CM | POA: Diagnosis not present

## 2017-11-10 DIAGNOSIS — J439 Emphysema, unspecified: Secondary | ICD-10-CM | POA: Diagnosis not present

## 2017-11-10 MED ORDER — UMECLIDINIUM-VILANTEROL 62.5-25 MCG/INH IN AEPB: 1.0000 | INHALATION_SPRAY | Freq: Every day | RESPIRATORY_TRACT | 0 refills | Status: AC

## 2017-11-10 MED ORDER — UMECLIDINIUM-VILANTEROL 62.5-25 MCG/INH IN AEPB: 1.0000 | INHALATION_SPRAY | Freq: Every day | RESPIRATORY_TRACT | 3 refills | Status: DC

## 2017-11-10 NOTE — Patient Instructions (Signed)
We will give you a sample of Anoro and start you on a prescription. Continue the albuterol inhaler as needed Schedule you for pulmonary function test Refer you for low-dose screening CT of the chest Follow-up in 1 month.Marland Kitchen

## 2017-11-10 NOTE — Addendum Note (Signed)
Addended by: Maryanna Shape A on: 11/10/2017 03:50 PM   Modules accepted: Orders

## 2017-11-10 NOTE — Progress Notes (Signed)
David King    [id number]    1956-10-20  Primary Care Physician:[name], Dwyane Luo, MD  Referring Physician: [name], MD [address] [place], [place] [address]  Chief complaint: Consult for evaluation of dyspnea.  HPI: 61 year old former smoker with history of .psoriasis, prostate cancer, arthritis, anxiety, migraines, diverticulosis. He has complaints of progressive dyspnea on exertion for the past 10 months.  He is symptomatic with rest and as well.  Denies any cough, sputum production, wheezing. He was seen in the ED on 11/03/17 with an x-ray that was negative for any acute abnormality.  He did not have any interventions done.  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  Quit in January 2018 Travel History: not significant  Outpatient Encounter Medications as of 11/10/2017  Medication Sig  . Apremilast (OTEZLA) 30 MG TABS Take 30 mg 2 (two) times daily by mouth.  . [DISCONTINUED] ciprofloxacin (CIPRO) 500 MG tablet Take 1 tablet (500 mg total) by mouth 2 (two) times daily. Start day prior to office visit for foley removal (Patient not taking: Reported on 11/03/2017)  . [DISCONTINUED] docusate sodium (COLACE) 100 MG capsule Take 1 capsule (100 mg total) by mouth 2 (two) times daily. (Patient not taking: Reported on 11/03/2017)  . [DISCONTINUED] HYDROcodone-acetaminophen (NORCO) 5-325 MG per tablet Take 1-2 tablets by mouth every 6 (six) hours as needed for pain. (Patient not taking: Reported on 11/03/2017)  . [DISCONTINUED] prednisoLONE acetate (PRED FORTE) 1 % ophthalmic suspension Place 1 drop into the left eye 2 (two) times daily.   No facility-administered encounter medications on file as of 11/10/2017.     Allergies as of 11/10/2017  . (No Known Allergies)    Past Medical History:    Diagnosis Date  . Anxiety    new dx  . Arthritis    lumbar  . Diverticulosis   . Elevated PSA   . Headache(784.0)    MIGRAINES  . Iritis    CHRONIC IN LEFT EYE - SOME VISIAL IMPAIRMENT IN LEFT EYE  . Neuromuscular disorder ([place])    left leg/foot,pinched siactic nerve  . Pain    PAIN LEFT HIP AND DOWN LT LEG WITH NUMBNESS IN LEFT LEG--PT STATES SCIATIC NERVE IMPINGEMENT - PT PLANS BACK IN THE NEAR SURGERY.  . 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 Renal cysts, acquired, bilateral 03/19/13   several simple , CT  . Urinary frequency    AND NOCTURIA    Past Surgical History:  Procedure Laterality Date  . BACK SURGERY    . CATARACT EXTRACTION W/ INTRAOCULAR LENS IMPLANT Left   . EYE SURGERY     RETINAL SURGERY LEFT EYE  . INGUINAL HERNIA REPAIR Bilateral AGE 16  . REMOVAL BURSA [place], LEFT ELBOW  1996  . TONSILLECTOMY     as achild    Family History  Problem Relation Age of Onset  . Cancer Father        prostate cancer  . Cancer Paternal Grandfather        prostate    Social History   Socioeconomic History  . Marital status: Married    Spouse name: Not on file  . Number of children: Not on file  . Years of education: Not on file  . Highest education level: Not on file  Social Needs  . Financial resource strain: Not on file  . Food insecurity - worry: Not on file  . Food insecurity - inability: Not on file  . Transportation needs - medical: Not on file  . Transportation needs - non-medical: Not on file  Occupational History  . Not on file  Tobacco Use  . Smoking status: Former Smoker    Packs/day: 1.50    Years: 40.00    Pack years: 60.00    Types: Cigarettes    Last attempt to quit: 12/31/2016    Years since quitting: 0.8  . Smokeless tobacco: Never Used  Substance and Sexual Activity  . Alcohol use: Yes    Alcohol/week: 4.0 oz    Types: 8 Standard drinks or equivalent per week    Comment: 3 DRINKS A DAY  . Drug use: No  . Sexual activity: No   Other Topics Concern  . Not on file  Social History Narrative  . Not on file    Review of systems: Review of Systems  Constitutional: Negative for fever and chills.  HENT: Negative.   Eyes: Negative for blurred vision.  Respiratory: as per HPI  Cardiovascular: Negative for chest pain and palpitations.  Gastrointestinal: Negative for vomiting, diarrhea, blood per rectum. Genitourinary: Negative for dysuria, urgency, frequency and hematuria.  Musculoskeletal: Negative for myalgias, back pain and joint pain.  Skin: Negative for itching and rash.  Neurological: Negative for dizziness, tremors, focal weakness, seizures and loss of consciousness.  Endo/Heme/Allergies: Negative for environmental allergies.  Psychiatric/Behavioral: Negative for depression, suicidal ideas and hallucinations.  All other systems reviewed and are negative.  Physical Exam: Blood pressure 122/78, pulse (!) 103, height 5\' 9"  (1.753 m), weight 88.8 kg (195 lb 12.8 oz), SpO2 97 %. Gen:      No acute distress HEENT:  EOMI, sclera anicteric Neck:     No masses; no thyromegaly Lungs:    Clear to auscultation bilaterally; normal respiratory effort CV:         Regular rate and rhythm; no murmurs Abd:      + bowel sounds; soft, non-tender; no palpable masses, no distension Ext:    No edema; adequate peripheral perfusion Skin:      Warm and dry; no rash Neuro: alert and oriented x 3 Psych: normal mood and affect  Data Reviewed: CT abdomen 03/19/13-visualized lung bases are clear. Chest x-ray 11/03/17-aortic atherosclerosis, hyperinflation with no active pulmonary disease I have reviewed all images personally.  CBC 11/03/17-absolute eosinophil count 90  Assessment:  Consult for evaluation of dyspnea.   Suspect COPD given his smoking history and hyperinflation on chest x-ray We will evaluate with pulmonary function test.  Give him a sample of Anoro He will continue albuterol inhaler as needed  Heavy  ex-smoker Refer for low-dose screening CT of the chest.  Plan/Recommendations: -Schedule PFTs -Start anoro, continue albuterol as needed -Low-dose screening CT of the chest  David Garfinkel MD [facility] Pager [phone] 11/10/2017, 3:05 PM  CC: David Cruel, MD

## 2017-11-11 ENCOUNTER — Other Ambulatory Visit: Payer: Self-pay | Admitting: Acute Care

## 2017-11-11 DIAGNOSIS — Z122 Encounter for screening for malignant neoplasm of respiratory organs: Secondary | ICD-10-CM

## 2017-11-11 DIAGNOSIS — Z87891 Personal history of nicotine dependence: Secondary | ICD-10-CM

## 2017-11-11 NOTE — Progress Notes (Signed)
Chest c 

## 2017-11-12 ENCOUNTER — Institutional Professional Consult (permissible substitution): Payer: PRIVATE HEALTH INSURANCE | Admitting: Pulmonary Disease

## 2017-11-14 ENCOUNTER — Ambulatory Visit (INDEPENDENT_AMBULATORY_CARE_PROVIDER_SITE_OTHER): Payer: PRIVATE HEALTH INSURANCE | Admitting: Acute Care

## 2017-11-14 ENCOUNTER — Ambulatory Visit (INDEPENDENT_AMBULATORY_CARE_PROVIDER_SITE_OTHER)
Admission: RE | Admit: 2017-11-14 | Discharge: 2017-11-14 | Disposition: A | Discharge status: Home / Self Care | Payer: PRIVATE HEALTH INSURANCE | Source: Ambulatory Visit | Attending: Acute Care | Admitting: Acute Care

## 2017-11-14 ENCOUNTER — Encounter: Payer: Self-pay | Admitting: Acute Care

## 2017-11-14 DIAGNOSIS — Z87891 Personal history of nicotine dependence: Secondary | ICD-10-CM

## 2017-11-14 DIAGNOSIS — Z122 Encounter for screening for malignant neoplasm of respiratory organs: Secondary | ICD-10-CM

## 2017-11-14 NOTE — Progress Notes (Signed)
Shared Decision Making Visit Lung Cancer Screening Program [phone])   Eligibility:  Age 61 y.o.  Pack Years Smoking History Calculation 94-pack-year smoking history (# packs/per year x # years smoked)  Recent History of coughing up blood  no  Unexplained weight loss? no ( >Than 15 pounds within the last 6 months )  Prior History Lung / other cancer no (Diagnosis within the last 5 years already requiring surveillance chest CT Scans).  Smoking Status Former Smoker  Former Smokers: Years since quit: < 1 year  Quit Date: 12/2016  Visit Components:  Discussion included one or more decision making aids. Yes  Discussion included risk/benefits of screening. yes  Discussion included potential follow up diagnostic testing for abnormal scans. yes  Discussion included meaning and risk of over diagnosis. yes  Discussion included meaning and risk of False Positives. yes  Discussion included meaning of total radiation exposure. yes  Counseling Included:  Importance of adherence to annual lung cancer LDCT screening. yes  Impact of comorbidities on ability to participate in the program. yes  Ability and willingness to under diagnostic treatment. yes  Smoking Cessation Counseling:  Current Smokers:   Discussed importance of smoking cessation.Not applicable former smoker  Information about tobacco cessation classes and interventions provided to patient. yes  Patient provided with "ticket" for LDCT Scan. yes  Symptomatic Patient. no  Counseling  Diagnosis Code: Tobacco Use Z72.0  Asymptomatic Patient yes  Counseling (Intermediate counseling: > three minutes counseling) E1740  Former Smokers:   Discussed the importance of maintaining cigarette abstinence. yes  Diagnosis Code: Personal History of Nicotine Dependence. C14.481  Information about tobacco cessation classes and interventions provided to patient. Yes  Patient provided with "ticket" for LDCT Scan.  yes  Written Order for Lung Cancer Screening with LDCT placed in Epic. Yes (CT Chest Lung Cancer Screening Low Dose W/O CM) [id number] Z12.2-Screening of respiratory organs Z87.891-Personal history of nicotine dependence  I spent 25 minutes of face to face time with David King discussing the risks and benefits of lung cancer screening. We viewed a power point together that explained in detail the above noted topics. We took the time to pause the power point at intervals to allow for questions to be asked and answered to ensure understanding. We discussed that he had taken the single most powerful action possible to decrease his risk of developing lung cancer when he quit smoking. I counseled David King to remain smoke free, and to contact me if he ever had the desire to smoke again so that I can provide resources and tools to help support the effort to remain smoke free. We discussed the time and location of the scan, and that either  Doroteo Glassman RN or I will call with the results within  24-48 hours of receiving them.  He has my card and contact information in the event he needs to speak with me, in addition to a copy of the power point we reviewed as a resource.  David King verbalized understanding of all of the above and had no further questions upon leaving the office.     I explained to the patient that there has been a high incidence of coronary artery disease noted on these exams. I explained that this is a non-gated exam therefore degree or severity cannot be determined. This patient is not currently on statin therapy. I have asked the patient to follow-up with their PCP regarding any incidental finding of coronary artery disease and management with diet or  medication as they feel is clinically indicated. The patient verbalized understanding of the above and had no further questions.     [name], NP 11/14/2017

## 2017-11-17 ENCOUNTER — Telehealth: Payer: Self-pay | Admitting: Acute Care

## 2017-11-17 NOTE — Telephone Encounter (Signed)
Pt aware per 11.16.18 LDCT result note Pt voiced his understanding and denied any further questions/concerns at this time Pt does not have a PCP to discuss CAD findings - will call the office for copy to be sent to new PCP once established  Will sign off

## 2017-11-17 NOTE — Progress Notes (Signed)
Pt returned call Advised of LDCT results/recs as stated by SG.  Pt voiced his understanding and denied any questions/concerns at this time.  Pt stated that he is currently between PCPs and will call the office once established for LDCT results/recs to be forwarded to that provider regarding the CAD findings.  Will leave in Denise's result box for placement of the future LDCT order for 10/2018.

## 2017-11-24 ENCOUNTER — Other Ambulatory Visit: Payer: Self-pay | Admitting: Acute Care

## 2017-11-24 DIAGNOSIS — Z122 Encounter for screening for malignant neoplasm of respiratory organs: Secondary | ICD-10-CM

## 2017-11-24 DIAGNOSIS — Z87891 Personal history of nicotine dependence: Secondary | ICD-10-CM

## 2017-12-11 ENCOUNTER — Ambulatory Visit: Payer: PRIVATE HEALTH INSURANCE | Admitting: Pulmonary Disease

## 2017-12-11 ENCOUNTER — Encounter: Payer: Self-pay | Admitting: Pulmonary Disease

## 2017-12-11 ENCOUNTER — Ambulatory Visit (INDEPENDENT_AMBULATORY_CARE_PROVIDER_SITE_OTHER): Payer: PRIVATE HEALTH INSURANCE | Admitting: Pulmonary Disease

## 2017-12-11 VITALS — BP 128/70 | HR 84 | Ht 69.0 in | Wt 203.0 lb

## 2017-12-11 DIAGNOSIS — J439 Emphysema, unspecified: Secondary | ICD-10-CM

## 2017-12-11 DIAGNOSIS — Z87891 Personal history of nicotine dependence: Secondary | ICD-10-CM | POA: Diagnosis not present

## 2017-12-11 LAB — PULMONARY FUNCTION TEST
DL/VA % pred: 79 %
DL/VA: 3.62 ml/min/mmHg/L
DLCO cor % pred: 79 %
DLCO cor: 24.57 ml/min/mmHg
DLCO unc % pred: 78 %
DLCO unc: 24.43 ml/min/mmHg
FEF 25-75 Post: 2.25 L/sec
FEF 25-75 Pre: 1.58 L/sec
FEF2575-%Change-Post: 42 %
FEF2575-%Pred-Post: 83 %
FEF2575-%Pred-Pre: 58 %
FEV1-%Change-Post: 9 %
FEV1-%Pred-Post: 79 %
FEV1-%Pred-Pre: 72 %
FEV1-Post: 2.67 L
FEV1-Pre: 2.45 L
FEV1FVC-%Change-Post: -3 %
FEV1FVC-%Pred-Pre: 93 %
FEV6-%Change-Post: 11 %
FEV6-%Pred-Post: 91 %
FEV6-%Pred-Pre: 81 %
FEV6-Post: 3.9 L
FEV6-Pre: 3.49 L
FEV6FVC-%Change-Post: 0 %
FEV6FVC-%Pred-Post: 104 %
FEV6FVC-%Pred-Pre: 105 %
FVC-%Change-Post: 12 %
FVC-%Pred-Post: 87 %
FVC-%Pred-Pre: 77 %
FVC-Post: 3.93 L
FVC-Pre: 3.49 L
Post FEV1/FVC ratio: 68 %
Post FEV6/FVC ratio: 99 %
Pre FEV1/FVC ratio: 70 %
Pre FEV6/FVC Ratio: 100 %
RV % pred: 171 %
RV: 3.88 L
TLC % pred: 113 %
TLC: 7.75 L

## 2017-12-11 MED ORDER — ALBUTEROL SULFATE HFA 108 (90 BASE) MCG/ACT IN AERS: 1.0000 | INHALATION_SPRAY | Freq: Four times a day (QID) | RESPIRATORY_TRACT | 6 refills | Status: DC | PRN

## 2017-12-11 NOTE — Patient Instructions (Signed)
Continue your Anoro inhaler We will send in a prescription for the albuterol Follow-up in 6 months.

## 2017-12-11 NOTE — Progress Notes (Signed)
[facility]    [id number]    Jul 13, 1956  Primary Care Physician:[name], Dwyane Luo, MD  Referring Physician: [name], MD [address] [place], [place] [address]  Chief complaint: Follow up for dyspnea.  HPI: 61 year old former smoker with history of .psoriasis, prostate cancer, arthritis, anxiety, migraines, diverticulosis. He has complaints of progressive dyspnea on exertion for the past 10 months.  He is symptomatic with rest and as well.  Denies any cough, sputum production, wheezing. He was seen in the ED on 11/03/17 with an x-ray that was negative for any acute abnormality.  He did not have any interventions done.  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  Quit in January 2018 Travel History: not significant  Interim history: States that breathing is at baseline.  He has chronic dyspnea on exertion.  Denies any new symptoms.  Outpatient Encounter Medications as of 12/11/2017  Medication Sig  . Apremilast (OTEZLA) 30 MG TABS Take 30 mg 2 (two) times daily by mouth.  . umeclidinium-vilanterol (ANORO ELLIPTA) 62.5-25 MCG/INH AEPB Inhale 1 puff daily into the lungs.   No facility-administered encounter medications on file as of 12/11/2017.     Allergies as of 12/11/2017  . (No Known Allergies)    Past Medical History:  Diagnosis Date  . Anxiety    new dx  . Arthritis    lumbar  . Diverticulosis   . Elevated PSA   . Headache(784.0)    MIGRAINES  . Iritis    CHRONIC IN LEFT EYE - SOME VISIAL IMPAIRMENT IN LEFT EYE  . Neuromuscular disorder ([place])    left leg/foot,pinched siactic nerve  . Pain    PAIN LEFT HIP AND DOWN LT LEG WITH NUMBNESS IN LEFT LEG--PT STATES SCIATIC NERVE IMPINGEMENT - PT PLANS BACK IN THE NEAR SURGERY.  . 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  Renal cysts, acquired, bilateral 03/19/13   several simple , CT  . Urinary frequency    AND NOCTURIA    Past Surgical History:  Procedure Laterality Date  . BACK SURGERY    . CATARACT EXTRACTION W/ INTRAOCULAR LENS IMPLANT Left   . EYE SURGERY     RETINAL SURGERY LEFT EYE  . HYDROCELE EXCISION Left 03/05/2013   Procedure: HYDROCELECTOMY ADULT;  Surgeon: Fredricka Bonine, MD;  Location: [facility];  Service: Urology;  Laterality: Left;  . INGUINAL HERNIA REPAIR Bilateral AGE 56  . LYMPHADENECTOMY Bilateral 05/06/2013   Procedure: LYMPHADENECTOMY;  Surgeon: Dutch Gray, MD;  Location: [facility];  Service: Urology;  Laterality: Bilateral;  . PROSTATE BIOPSY N/A 03/05/2013   Procedure: PROSTATE BIOPSY and ultrasound;  Surgeon: Fredricka Bonine, MD;  Location: [facility];  Service: Urology;  Laterality: N/A;  . REMOVAL BURSA [place], LEFT ELBOW  1996  . ROBOT ASSISTED LAPAROSCOPIC RADICAL PROSTATECTOMY N/A 05/06/2013   Procedure: ROBOTIC ASSISTED LAPAROSCOPIC RADICAL PROSTATECTOMY LEVEL 3;  Surgeon: Dutch Gray, MD;  Location: [facility];  Service: Urology;  Laterality: N/A;  . TONSILLECTOMY     as achild    Family History  Problem Relation Age of Onset  . Cancer Father        prostate cancer  . Cancer Paternal Grandfather        prostate    Social History  Socioeconomic History  . Marital status: Married    Spouse name: Not on file  . Number of children: Not on file  . Years of education: Not on file  . Highest education level: Not on file  Social Needs  . Financial resource strain: Not on file  . Food insecurity - worry: Not on file  . Food insecurity - inability: Not on file  . Transportation needs - medical: Not on file  . Transportation needs - non-medical: Not on file  Occupational History  . Not on file  Tobacco Use  . Smoking status: Former Smoker    Packs/day: 2.00    Years: 47.00    Pack years: 94.00    Types: Cigarettes    Last  attempt to quit: 12/31/2016    Years since quitting: 0.9  . Smokeless tobacco: Never Used  . Tobacco comment: Patient is currently smoke free since January 2018  Substance and Sexual Activity  . Alcohol use: Yes    Alcohol/week: 4.0 oz    Types: 8 Standard drinks or equivalent per week    Comment: 3 DRINKS A DAY  . Drug use: No  . Sexual activity: No  Other Topics Concern  . Not on file  Social History Narrative  . Not on file   Review of systems: Review of Systems  Constitutional: Negative for fever and chills.  HENT: Negative.   Eyes: Negative for blurred vision.  Respiratory: as per HPI  Cardiovascular: Negative for chest pain and palpitations.  Gastrointestinal: Negative for vomiting, diarrhea, blood per rectum. Genitourinary: Negative for dysuria, urgency, frequency and hematuria.  Musculoskeletal: Negative for myalgias, back pain and joint pain.  Skin: Negative for itching and rash.  Neurological: Negative for dizziness, tremors, focal weakness, seizures and loss of consciousness.  Endo/Heme/Allergies: Negative for environmental allergies.  Psychiatric/Behavioral: Negative for depression, suicidal ideas and hallucinations.  All other systems reviewed and are negative.  Physical Exam: Blood pressure 128/70, pulse 84, height 5\' 9"  (1.753 m), weight 203 lb (92.1 kg), SpO2 94 %. Gen:      No acute distress HEENT:  EOMI, sclera anicteric Neck:     No masses; no thyromegaly Lungs:    Clear to auscultation bilaterally; normal respiratory effort CV:         Regular rate and rhythm; no murmurs Abd:      + bowel sounds; soft, non-tender; no palpable masses, no distension Ext:    No edema; adequate peripheral perfusion Skin:      Warm and dry; no rash Neuro: alert and oriented x 3 Psych: normal mood and affect  Data Reviewed: CT abdomen 03/19/13-visualized lung bases are clear. Chest x-ray 11/03/17-aortic atherosclerosis, hyperinflation with no active pulmonary  disease Screening CT chest 11/14/17-mild emphysema with lower lobe bronchiectasis.  3.5 mm nodule in left upper lobe. I have reviewed all images personally.  CBC 11/03/17-absolute eosinophil count 90  PFTs 12/11/17 FVC 3.93 (87%], FEV1 2.67 [79%], F/F 68, TLC 113%, RV/TLC 147%, DLCO 78% Mild obstruction with minimal diffusion defect.  Positive bronchodilator effect. Air trapping.  Assessment:  Mild COPD PFTs reviewed which showed mild obstruction with bronchodilator response.  He is doing well on anoro and will continue on the same. Continue albuterol as needed.  Heavy ex-smoker Continue annual low-dose screening CT of the chest.  Plan/Recommendations: - Continue anoro, albuterol as needed - Continue Low-dose screening CT of the chest  [name] MD [facility] Pager [phone] 12/11/2017, 12:13 PM  CC: [name], MD

## 2017-12-11 NOTE — Progress Notes (Signed)
PFT completed today 12/11/17

## 2018-01-15 ENCOUNTER — Telehealth: Payer: Self-pay | Admitting: Pulmonary Disease

## 2018-01-15 NOTE — Telephone Encounter (Signed)
Records have been sent. Pt is aware. Nothing further was needed.

## 2018-02-24 DIAGNOSIS — I200 Unstable angina: Secondary | ICD-10-CM | POA: Insufficient documentation

## 2018-03-06 DIAGNOSIS — I2510 Atherosclerotic heart disease of native coronary artery without angina pectoris: Secondary | ICD-10-CM | POA: Insufficient documentation

## 2018-03-07 ENCOUNTER — Other Ambulatory Visit: Payer: Self-pay | Admitting: Pulmonary Disease

## 2018-03-10 HISTORY — PX: CARDIAC CATHETERIZATION: SHX172

## 2018-04-11 ENCOUNTER — Other Ambulatory Visit: Payer: Self-pay | Admitting: Pulmonary Disease

## 2018-06-26 ENCOUNTER — Ambulatory Visit (INDEPENDENT_AMBULATORY_CARE_PROVIDER_SITE_OTHER): Payer: PRIVATE HEALTH INSURANCE | Admitting: Pulmonary Disease

## 2018-06-26 ENCOUNTER — Encounter: Payer: Self-pay | Admitting: Pulmonary Disease

## 2018-06-26 ENCOUNTER — Other Ambulatory Visit (INDEPENDENT_AMBULATORY_CARE_PROVIDER_SITE_OTHER): Payer: PRIVATE HEALTH INSURANCE

## 2018-06-26 DIAGNOSIS — R0602 Shortness of breath: Secondary | ICD-10-CM | POA: Diagnosis not present

## 2018-06-26 LAB — CBC WITH DIFFERENTIAL/PLATELET
Basophils Absolute: 0.1 10*3/uL (ref 0.0–0.1)
Basophils Relative: 0.8 % (ref 0.0–3.0)
Eosinophils Absolute: 0.1 10*3/uL (ref 0.0–0.7)
Eosinophils Relative: 0.9 % (ref 0.0–5.0)
HCT: 44.7 % (ref 39.0–52.0)
Hemoglobin: 15.1 g/dL (ref 13.0–17.0)
Lymphocytes Relative: 30.1 % (ref 12.0–46.0)
Lymphs Abs: 2.5 10*3/uL (ref 0.7–4.0)
MCHC: 33.9 g/dL (ref 30.0–36.0)
MCV: 101 fl — ABNORMAL HIGH (ref 78.0–100.0)
Monocytes Absolute: 0.7 10*3/uL (ref 0.1–1.0)
Monocytes Relative: 8 % (ref 3.0–12.0)
Neutro Abs: 5 10*3/uL (ref 1.4–7.7)
Neutrophils Relative %: 60.2 % (ref 43.0–77.0)
Platelets: 292 10*3/uL (ref 150.0–400.0)
RBC: 4.43 Mil/uL (ref 4.22–5.81)
RDW: 13.5 % (ref 11.5–15.5)
WBC: 8.4 10*3/uL (ref 4.0–10.5)

## 2018-06-26 MED ORDER — FLUTICASONE-UMECLIDIN-VILANT 100-62.5-25 MCG/INH IN AEPB: 1.0000 | INHALATION_SPRAY | Freq: Every day | RESPIRATORY_TRACT | 6 refills | Status: DC

## 2018-06-26 NOTE — Progress Notes (Signed)
Inhaler training given. In-check peak flow #:60

## 2018-06-26 NOTE — Patient Instructions (Signed)
We will stop the Anoro and try trelegy as you continue to have symptoms Check CBC differential, blood allergy profile and alpha-1 antitrypsin levels and phenotype Continue annual CTs of the chest.  Follow-up in 6 months.

## 2018-06-26 NOTE — Progress Notes (Signed)
David [facility]    [id number]    1956/10/13  Primary Care Physician:[name], Dwyane Luo, MD  Referring Physician: [name], MD [address] [place], [place] [address]  Chief complaint: Follow up for dyspnea.  HPI: 61 year old former smoker with history of .psoriasis, prostate cancer, arthritis, anxiety, migraines, diverticulosis. He has complaints of progressive dyspnea on exertion for the past 10 months.  He is symptomatic with rest and as well.  Denies any cough, sputum production, wheezing. He was seen in the ED on 11/03/17 with an x-ray that was negative for any acute abnormality.  He did not have any interventions done.  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  Quit in January 2018 Travel History: not significant  Interim history: Continues on Anoro inhaler.  Using the albuterol about once a day Evaluated by cardiology at [facility] for unstable angina.  Underwent a left heart cath with no obstructive coronary disease Describes sensation of throat tightness, jaw tightness.  Started on omeprazole for treatment of GERD by his cardiologist  Outpatient Encounter Medications as of 06/26/2018  Medication Sig  . albuterol (PROVENTIL HFA;VENTOLIN HFA) 108 (90 Base) MCG/ACT inhaler Inhale 1-2 puffs into the lungs every 6 (six) hours as needed for wheezing or shortness of breath.  [facility] ELLIPTA 62.5-25 MCG/INH AEPB USE 1 PUFF DAILY AS DIRECTED  . Apremilast (OTEZLA) 30 MG TABS Take 30 mg 2 (two) times daily by mouth.   No facility-administered encounter medications on file as of 06/26/2018.     Allergies as of 06/26/2018  . (No Known Allergies)    Past Medical History:  Diagnosis Date  . Anxiety    new dx  . Arthritis    lumbar  . Diverticulosis   . Elevated PSA   . Headache(784.0)    MIGRAINES  . Iritis    CHRONIC IN LEFT EYE - SOME VISIAL IMPAIRMENT IN LEFT EYE  . Neuromuscular disorder ([facility])    left leg/foot,pinched siactic nerve  . Pain    PAIN LEFT HIP AND DOWN LT LEG WITH NUMBNESS IN LEFT LEG--PT STATES SCIATIC NERVE IMPINGEMENT - PT PLANS BACK IN THE NEAR SURGERY.  . Prostate cancer ([facility]) 03/05/13   Adenocarcinoma  . Renal cysts, acquired, bilateral 03/19/13   several simple , CT  . Urinary frequency    AND NOCTURIA    Past Surgical History:  Procedure Laterality Date  . BACK SURGERY    . CATARACT EXTRACTION W/ INTRAOCULAR LENS IMPLANT Left   . EYE SURGERY     RETINAL SURGERY LEFT EYE  . HYDROCELE EXCISION Left 03/05/2013   Procedure: HYDROCELECTOMY ADULT;  Surgeon: David Bonine, MD;  Location: [facility];  Service: Urology;  Laterality: Left;  . INGUINAL HERNIA REPAIR Bilateral AGE 44  . LYMPHADENECTOMY Bilateral 05/06/2013   Procedure: LYMPHADENECTOMY;  Surgeon: David Gray, MD;  Location: [facility];  Service: Urology;  Laterality: Bilateral;  . PROSTATE BIOPSY N/A 03/05/2013   Procedure: PROSTATE BIOPSY and ultrasound;  Surgeon: David Bonine, MD;  [facility];  Service: Urology;  Laterality: N/A;  . REMOVAL BURSA [facility], LEFT ELBOW  1996  . ROBOT ASSISTED LAPAROSCOPIC RADICAL PROSTATECTOMY N/A 05/06/2013   Procedure: ROBOTIC ASSISTED LAPAROSCOPIC RADICAL PROSTATECTOMY LEVEL 3;  Surgeon: David Gray, MD;  Location: [facility];  Service: Urology;  Laterality: N/A;  . TONSILLECTOMY     as achild    Family History  Problem Relation Age of Onset  . Cancer Father        prostate cancer  . Cancer Paternal Grandfather        prostate    Social History   Socioeconomic History  . Marital status: Married    Spouse name: Not on file  . Number of children: Not on file  . Years of education: Not on file  . Highest education level: Not on file  Occupational History  . Not on file  Social Needs  . Financial  resource strain: Not on file  . Food insecurity:    Worry: Not on file    Inability: Not on file  . Transportation needs:    Medical: Not on file    Non-medical: Not on file  Tobacco Use  . Smoking status: Former Smoker    Packs/day: 2.00    Years: 47.00    Pack years: 94.00    Types: Cigarettes    Last attempt to quit: 12/31/2016    Years since quitting: 1.4  . Smokeless tobacco: Never Used  . Tobacco comment: Patient is currently smoke free since January 2018  Substance and Sexual Activity  . Alcohol use: Yes    Alcohol/week: 4.8 oz    Types: 8 Standard drinks or equivalent per week    Comment: 3 DRINKS A DAY  . Drug use: No  . Sexual activity: Never  Lifestyle  . Physical activity:    Days per week: Not on file    Minutes per session: Not on file  . Stress: Not on file  Relationships  . Social connections:    Talks on phone: Not on file    Gets together: Not on file    Attends religious service: Not on file    Active member of club or organization: Not on file    Attends meetings of clubs or organizations: Not on file    Relationship status: Not on file  . Intimate partner violence:    Fear of current or ex partner: Not on file    Emotionally abused: Not on file    Physically abused: Not on file    Forced sexual activity: Not on file  Other Topics Concern  . Not on file  Social History Narrative  . Not on file   Review of systems: Review of Systems  Constitutional: Negative for fever and chills.  HENT: Negative.   Eyes: Negative for blurred vision.  Respiratory: as per HPI  Cardiovascular: Negative for chest pain and palpitations.  Gastrointestinal: Negative for vomiting, diarrhea, blood per rectum. Genitourinary: Negative for dysuria, urgency, frequency and hematuria.  Musculoskeletal: Negative for myalgias, back pain and joint pain.  Skin: Negative for itching and rash.  Neurological: Negative for dizziness, tremors, focal weakness, seizures and loss of  consciousness.  Endo/Heme/Allergies: Negative for environmental allergies.  Psychiatric/Behavioral: Negative for depression, suicidal ideas and hallucinations.  All other systems reviewed and are negative.  Physical Exam: Blood pressure 126/72, pulse 74, height 5\' 9"  (1.753 m), weight 198 lb (89.8 kg), SpO2 97 %. Gen:      No acute distress HEENT:  EOMI, sclera anicteric Neck:     No masses; no thyromegaly Lungs:    Clear to auscultation bilaterally; normal respiratory effort CV:         Regular rate and rhythm; no murmurs Abd:      +  bowel sounds; soft, non-tender; no palpable masses, no distension Ext:    No edema; adequate peripheral perfusion Skin:      Warm and dry; no rash Neuro: alert and oriented x 3 Psych: normal mood and affect  Data Reviewed: CT abdomen 03/19/13-visualized lung bases are clear. Chest x-ray 11/03/17-aortic atherosclerosis, hyperinflation with no active pulmonary disease Screening CT chest 11/14/17-mild emphysema with lower lobe bronchiectasis.  3.5 mm nodule in left upper lobe. I have reviewed all images personally.  CBC 11/03/17-absolute eosinophil count 90  PFTs 12/11/17 FVC 3.93 (87%], FEV1 2.67 [79%], F/F 68, TLC 113%, RV/TLC 147%, DLCO 78% Mild obstruction with minimal diffusion defect.  Positive bronchodilator effect. Air trapping.  Assessment:  Mild COPD PFTs reviewed which showed mild obstruction with bronchodilator response.   Continues to be symptomatic with episodes of throat tightness  Try on trelegy instructed in order to see if his symptoms improve  GERD Started on PPI by cardiologist.  Heavy ex-smoker Screening CT reviewed with stable lung nodules Continue annual low-dose screening.  Health maintenance Does not want to get any immunizations.  Plan/Recommendations: - Stop Anoro. Start trelegy - Continue annual screening CT of the chest.  [name] MD [facility] 06/26/2018, 11:56 AM  CC: David Cruel, MD

## 2018-06-30 LAB — RESPIRATORY ALLERGY PROFILE REGION II ~~LOC~~
Allergen, A. alternata, m6: <0.1 kU/L
Allergen, Cedar tree, t12: <0.1 kU/L
Allergen, Comm Silver Birch, t9: <0.1 kU/L
Allergen, Cottonwood, t14: <0.1 kU/L
Allergen, D pternoyssinus,d7: <0.1 kU/L
Allergen, Mouse Urine Protein, e78: <0.1 kU/L
Allergen, Mulberry, t76: <0.1 kU/L
Allergen, Oak,t7: <0.1 kU/L
Allergen, P. notatum, m1: <0.1 kU/L
Aspergillus fumigatus, m3: <0.1 kU/L
Bermuda Grass: <0.1 kU/L
Box Elder IgE: <0.1 kU/L
CLADOSPORIUM HERBARUM (M2) IGE: <0.1 kU/L
COMMON RAGWEED (SHORT) (W1) IGE: <0.1 kU/L
Cat Dander: <0.1 kU/L
Class: 0
Class: 0
Class: 0
Class: 0
Class: 0
Class: 0
Class: 0
Class: 0
Class: 0
Class: 0
Class: 0
Class: 0
Class: 0
Class: 0
Class: 0
Class: 0
Class: 0
Class: 0
Class: 0
Class: 0
Class: 0
Class: 0
Class: 0
Class: 0
Cockroach: <0.1 kU/L
D. farinae: <0.1 kU/L
Dog Dander: <0.1 kU/L
Elm IgE: <0.1 kU/L
IgE (Immunoglobulin E), Serum: 130 kU/L — ABNORMAL HIGH (ref <=114)
Johnson Grass: <0.1 kU/L
Pecan/Hickory Tree IgE: <0.1 kU/L
Rough Pigweed  IgE: <0.1 kU/L
Sheep Sorrel IgE: <0.1 kU/L
Timothy Grass: <0.1 kU/L

## 2018-06-30 LAB — ALPHA-1 ANTITRYPSIN PHENOTYPE: A-1 Antitrypsin, Ser: 157 mg/dL (ref 83–199)

## 2018-06-30 LAB — INTERPRETATION:

## 2018-07-14 ENCOUNTER — Telehealth: Payer: Self-pay | Admitting: Pulmonary Disease

## 2018-07-14 NOTE — Telephone Encounter (Signed)
Called and spoke with patient regarding results.  Informed the patient of results and recommendations today. Pt verbalized understanding and denied any questions or concerns at this time.  Nothing further needed.  

## 2018-10-22 ENCOUNTER — Ambulatory Visit: Payer: Self-pay | Admitting: Nurse Practitioner

## 2018-10-22 VITALS — BP 140/67 | HR 98 | Temp 98.0°F | Resp 16 | Ht 69.0 in | Wt 206.0 lb

## 2018-10-22 DIAGNOSIS — L400 Psoriasis vulgaris: Secondary | ICD-10-CM

## 2018-10-22 DIAGNOSIS — M62838 Other muscle spasm: Secondary | ICD-10-CM

## 2018-10-22 MED ORDER — CYCLOBENZAPRINE HCL 10 MG PO TABS: 10.0000 mg | ORAL_TABLET | Freq: Three times a day (TID) | ORAL | 1 refills | Status: DC | PRN

## 2018-10-22 NOTE — Progress Notes (Signed)
   Subjective:    Patient ID: David King, male    DOB: April 28, 1956, 62 y.o.   MRN: [id number]  HPI [facility] requesting provider administer new Alferd Patee injection for plaque psoriasis. He was on [place] and no significant improvement so he has agreed to try [employer], although he's "afraid of needles". He has been educated on this new medication by his dermatologist Dr. Allyn Kenner who manages his psoriasis and aware of the SE of URI, headache, fatigue and risk of infections. He admits he had a TB test in the last year prior to the start of [place]. Today he denies any TB symptoms. Denies exposure to TB, bad cough x 3 weeks, c/p, SOB, hemoptysis, weakness, fatigue, weight loss, decreased appetitis, chills, fever or night sweats. He continues to suffer from skin concerns to hands and elbows.  He also c/o neck discomfort or pulling of the neck which has been ongoing and he reports his neck gets so tight at times his tongue pulls to the roof of his mouth. He admits he's frustrated because he's seen multiple specialist who have addressed of issues related to their specialty but his neck issue remains. "I just want this fixed".    Review of Systems  Constitutional: Negative for appetite change, chills, fatigue, fever and unexpected weight change.  Respiratory: Negative for shortness of breath.   Cardiovascular: Negative for chest pain.  Musculoskeletal: Positive for neck pain.  Skin:       "plaque psoriasis"         Objective:   Physical Exam  Constitutional: He is oriented to person, place, and time. He appears well-developed and well-nourished.  HENT:  Head: Normocephalic and atraumatic.  Neck: Normal range of motion.  Cardiovascular: Normal rate, regular rhythm and normal heart sounds.  Pulmonary/Chest: Effort normal and breath sounds normal. No respiratory distress.  Musculoskeletal: He exhibits tenderness.  Palpable hardening to the sternocleidomastoid  muscle and worse on left with some tenderness on palpation. FROM except limitation with ROM to right and hyperflexion with strength 3/5 with right rotation and left 5/5 rotation.   Neurological: He is alert and oriented to person, place, and time.  Skin: Skin is warm and dry.  Bilateral hands and mostly at distal tip with erythematous scaling and peeling of skin. Bilateral elbows with plaque and mild erythema, right worse than left  Psychiatric: He has a normal mood and affect.  Vitals reviewed.  Skyrizi 150 mg (2 divided doses of 75mg /0.91ml) administered via SQ to left and right upper arm; which patient brought in. Tool 2956-2130-86 Exp [id number]. He tolerated injections well and sat in clinic with no acute reactions.       Assessment & Plan:

## 2018-10-22 NOTE — Patient Instructions (Addendum)
Monitor for s/s of new med [facility] intolerance and f/u with dermatology if needed Will trial cyclobenazeprine to assist with neck tension and f/u 1 mo to eval and SE discussed today. He mentions he has tried this in the past and done well with it. Med reconciliation done today and not taking baby asa as advised; discussed rationale and risk factors of not taking as instructed by cardiology. Agrees to resume taking RTC 1 mo 11/19/2018

## 2018-11-18 ENCOUNTER — Ambulatory Visit (INDEPENDENT_AMBULATORY_CARE_PROVIDER_SITE_OTHER)
Admission: RE | Admit: 2018-11-18 | Discharge: 2018-11-18 | Disposition: A | Discharge status: Home / Self Care | Payer: PRIVATE HEALTH INSURANCE | Source: Ambulatory Visit | Attending: Acute Care | Admitting: Acute Care

## 2018-11-18 ENCOUNTER — Telehealth: Payer: Self-pay | Admitting: Pulmonary Disease

## 2018-11-18 DIAGNOSIS — Z87891 Personal history of nicotine dependence: Secondary | ICD-10-CM | POA: Diagnosis not present

## 2018-11-18 DIAGNOSIS — Z122 Encounter for screening for malignant neoplasm of respiratory organs: Secondary | ICD-10-CM

## 2018-11-18 NOTE — Telephone Encounter (Signed)
Called and spoke to [place] at [place], call report for patient as follow:  Lung-RADS 4A, suspicious. Follow up low-dose chest CT without contrast in 3 months (please use the following order, "CT CHEST LCS NODULE FOLLOW-UP W/O CM") is recommended.  New irregular/spiculated 7.0 mm nodule in the posterior left upper Lobe.   SG please advise.

## 2018-11-19 ENCOUNTER — Ambulatory Visit: Payer: PRIVATE HEALTH INSURANCE | Admitting: Nurse Practitioner

## 2018-11-19 VITALS — BP 132/84 | HR 70 | Temp 98.6°F | Resp 16 | Ht 69.0 in | Wt 206.0 lb

## 2018-11-19 DIAGNOSIS — L409 Psoriasis, unspecified: Secondary | ICD-10-CM

## 2018-11-19 DIAGNOSIS — M62838 Other muscle spasm: Secondary | ICD-10-CM

## 2018-11-19 MED ORDER — CYCLOBENZAPRINE HCL 10 MG PO TABS: 10.0000 mg | ORAL_TABLET | Freq: Three times a day (TID) | ORAL | 2 refills | Status: DC | PRN

## 2018-11-19 NOTE — Patient Instructions (Addendum)
Start steroid cream to hands and apply as directed per dermatology orders Discussed importance of keeping hands moisturized to prevent breakdown Pick up cyclobenzaprine and take as directed Follow up with dermatology if psoriasis worsens or intolerance issues Return to clinic 12 weeks for follow up and next injection

## 2018-11-19 NOTE — Progress Notes (Signed)
   Subjective:    Patient ID: [facility], male    DOB: [birth year], 62 y.o.   MRN: [id number]  HPI [facility] to f/u on neck spasms and receive next Syrizi injection. Has been taking cyclobenazaprine as directed with relief and tolerating well.  Reports ran out 2 days ago and noticed stiffness has returned.  Denies any SE from Skyrizi injections but hands has worsened. Has not been moisturizing hands well or using clobetaol cream that usually helps when hands worsen.   Review of Systems  Constitutional: Negative for fatigue, fever and unexpected weight change.  Respiratory: Negative for shortness of breath and wheezing.   Skin:       Hands psoriasis has worsened       Objective:   Physical Exam  Constitutional: He is oriented to person, place, and time. He appears well-developed and well-nourished.  HENT:  Head: Normocephalic.  Neck:  Limited ROM to neck but moves with no pain and better range than last visit. No rigidness of neck  Cardiovascular: Normal rate.  Pulmonary/Chest: No respiratory distress.  Musculoskeletal: Normal range of motion.  Neurological: He is alert and oriented to person, place, and time.  Skin: Skin is warm and dry.  Significant amount of peeling and scaling of palmer region as well as moderate peeling to dorsal hand. Areas with peeling are erythematous. No drainage or fissure noted. Elbows with minimal scaling and mild erythema, right worse then left.  Psychiatric: He has a normal mood and affect.  Vitals reviewed.   Brings Skyrizi injections with him and 1 injection given to each upper arm SQ and tolerated injections. 150mg  SQ injection (75mg [facility]) Exp Apr 2021 Lot [id number]      Assessment & Plan:

## 2018-11-23 NOTE — Telephone Encounter (Signed)
Sarah please advise. Thanks. 

## 2018-12-02 NOTE — Telephone Encounter (Signed)
Per Sarah's documentation, she attempted to contact the pt on 12/01/18. Encounter will be closed as there is a result note open.

## 2018-12-03 ENCOUNTER — Telehealth: Payer: Self-pay | Admitting: Acute Care

## 2018-12-03 ENCOUNTER — Other Ambulatory Visit: Payer: Self-pay | Admitting: Acute Care

## 2018-12-03 DIAGNOSIS — Z122 Encounter for screening for malignant neoplasm of respiratory organs: Secondary | ICD-10-CM

## 2018-12-03 DIAGNOSIS — Z87891 Personal history of nicotine dependence: Secondary | ICD-10-CM

## 2018-12-03 NOTE — Telephone Encounter (Signed)
I called [name] and explained that his Low Dose Screening CT was read as a Lung RADS 4 A : suspicious findings, either short term follow up in 3 months or alternatively  PET Scan evaluation may be considered when there is a solid component of  8 mm or larger.I explained that we will need to do a follow up scan in 3 months to re-evaluate the area of suspicion. He verbalized understanding. The 3 month follow up CT has been placed by Doroteo Glassman RN.

## 2018-12-17 ENCOUNTER — Ambulatory Visit: Payer: PRIVATE HEALTH INSURANCE

## 2019-02-10 ENCOUNTER — Other Ambulatory Visit: Payer: Self-pay | Admitting: Pulmonary Disease

## 2019-02-11 ENCOUNTER — Ambulatory Visit: Payer: PRIVATE HEALTH INSURANCE

## 2019-02-12 ENCOUNTER — Other Ambulatory Visit: Payer: Self-pay | Admitting: Pulmonary Disease

## 2019-02-19 ENCOUNTER — Ambulatory Visit (INDEPENDENT_AMBULATORY_CARE_PROVIDER_SITE_OTHER)
Admission: RE | Admit: 2019-02-19 | Discharge: 2019-02-19 | Disposition: A | Discharge status: Home / Self Care | Payer: PRIVATE HEALTH INSURANCE | Source: Ambulatory Visit | Attending: Acute Care | Admitting: Acute Care

## 2019-02-19 DIAGNOSIS — Z122 Encounter for screening for malignant neoplasm of respiratory organs: Secondary | ICD-10-CM | POA: Diagnosis not present

## 2019-02-19 DIAGNOSIS — Z87891 Personal history of nicotine dependence: Secondary | ICD-10-CM

## 2019-02-23 ENCOUNTER — Encounter: Payer: Self-pay | Admitting: Pulmonary Disease

## 2019-02-23 ENCOUNTER — Ambulatory Visit (INDEPENDENT_AMBULATORY_CARE_PROVIDER_SITE_OTHER): Payer: PRIVATE HEALTH INSURANCE | Admitting: Pulmonary Disease

## 2019-02-23 VITALS — BP 136/78 | HR 79 | Ht 69.0 in | Wt 219.6 lb

## 2019-02-23 DIAGNOSIS — R911 Solitary pulmonary nodule: Secondary | ICD-10-CM

## 2019-02-23 DIAGNOSIS — Z23 Encounter for immunization: Secondary | ICD-10-CM

## 2019-02-23 MED ORDER — FLUTICASONE-UMECLIDIN-VILANT 100-62.5-25 MCG/INH IN AEPB: 1.0000 | INHALATION_SPRAY | Freq: Every day | RESPIRATORY_TRACT | 5 refills | Status: DC

## 2019-02-23 NOTE — Progress Notes (Signed)
David King    [id number]    1956-05-07  Primary Care Physician:King, David Luo, MD  Referring Physician: [name], MD King, David [address]  Chief complaint: Follow up for COPD, lung nodules  HPI: 63 year old former smoker with history of psoriasis, prostate cancer, arthritis, anxiety, migraines, diverticulosis. He has complaints of progressive dyspnea on exertion for the past 10 months.  He is symptomatic with rest and as well.  Denies any cough, sputum production, wheezing. He was seen in the ED on 11/03/17 with an x-ray that was negative for any acute abnormality.  He did not have any interventions done.  Evaluated by cardiology at [facility] for unstable angina.  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  Quit in January 2018 Travel History: not significant  Interim history: Continues on Trelegy inhaler.  Feels that this is working better for him than Anoro No new complaints today.  Outpatient Encounter Medications as of 02/23/2019  Medication Sig  . albuterol (PROVENTIL HFA;VENTOLIN HFA) 108 (90 Base) MCG/ACT inhaler Inhale into the lungs.  Marland Kitchen aspirin EC 81 MG tablet Take by mouth.  . clobetasol cream (TEMOVATE) 0.05 %   . omeprazole (PRILOSEC) 20 MG capsule Take by mouth.  . SKYRIZI, 150 MG DOSE, 75 MG/0.83ML PSKT   . TRELEGY ELLIPTA 100-62.5-25 MCG/INH AEPB INHALE 1 PUFF INTO THE LUNGS DAILY.  . [DISCONTINUED] cyclobenzaprine (FLEXERIL) 10 MG tablet Take 1 tablet (10 mg total) by mouth 3 (three) times daily as needed for muscle spasms.  . [DISCONTINUED] isosorbide mononitrate (IMDUR) 30 MG 24 hr tablet Take by mouth.  . [DISCONTINUED] prednisoLONE 5 MG (21) TBPK 5 mg x 6 days.  Take  as directed.   No facility-administered encounter medications on file as of 02/23/2019.    Physical Exam: Blood pressure 136/78, pulse 79, height 5\' 9"  (1.753 m), weight 219 lb 9.6 oz (99.6 kg), SpO2 96 %. Gen:      No acute distress HEENT:  EOMI, sclera anicteric Neck:     No masses; no thyromegaly Lungs:    Clear to auscultation bilaterally; normal respiratory effort CV:         Regular rate and rhythm; no murmurs Abd:      + bowel sounds; soft, non-tender; no palpable masses, no distension Ext:    No edema; adequate peripheral perfusion Skin:      Warm and dry; no rash Neuro: alert and oriented x 3 Psych: normal mood and affect  Data Reviewed: Imaging CT abdomen 03/19/13-visualized lung bases are clear. Screening CT chest 11/14/17-mild emphysema with lower lobe bronchiectasis.  3.5 mm nodule in left upper lobe. Screening CT chest 11/18/2018- new irregular 7 mm nodule in the left upper lobe.  3.5 mm nodule is stable Screening CT chest 02/19/2019- 7 mm nodule previously identified has most completely resolved.  There is a new left upper lobe nodule measuring 5.7 mm. I have reviewed all images personally.  PFTs 12/11/17 FVC 3.93 (87%], FEV1 2.67 [79%], F/F 68, TLC 113%, RV/TLC 147%, DLCO 78% Mild obstruction with minimal diffusion defect.  Positive bronchodilator effect. Air trapping.  Labs CBC 06/18/2018-WBC 8.4, eos 0.9%, absolute eosinophil count 76 Alpha-1 antitrypsin 06/26/2018-157, PI MM Blood allergy profile 06/26/2018-IgE 130, RAST  panel negative  Assessment:  Mild COPD PFTs reviewed which showed mild obstruction with bronchodilator response.   Continues on Trelegy inhaler.  Not clear to me if he needs inhaled steroids as peripheral eosinophils are low however he feels this works better for him than the Anoro.  GERD Continue on PPI  Pulmonary nodules Follow-up CT scan reviewed with resolution of the concerning upper lobe nodule.  There is however a new nodule in the left  upper lobe which will require follow-up Follow-up CT in 6 months.  Health maintenance Does not want influenza vaccine Agreed to get Pneumovax today  Plan/Recommendations: - Continue Trelegy - CT in 6 months - Pneumovax  David Garfinkel MD [facility] 02/23/2019, 2:04 PM  CC: David Cruel, MD

## 2019-02-23 NOTE — Patient Instructions (Signed)
I have reviewed his CT scan which shows that the previously noted lung nodule has resolved.  However there is a new nodule that will need follow-up We will get a follow-up CT in 6 months time Continue the Trelegy inhaler Follow-up in 6 months after CT scan.

## 2019-03-01 ENCOUNTER — Other Ambulatory Visit: Payer: Self-pay | Admitting: Acute Care

## 2019-03-01 DIAGNOSIS — Z122 Encounter for screening for malignant neoplasm of respiratory organs: Secondary | ICD-10-CM

## 2019-03-01 DIAGNOSIS — Z87891 Personal history of nicotine dependence: Secondary | ICD-10-CM

## 2019-05-13 ENCOUNTER — Encounter: Payer: Self-pay | Admitting: Nurse Practitioner

## 2019-05-13 NOTE — Progress Notes (Addendum)
MRN: [id number] DOB: May 24, 1956  Subjective:   David King is a 63 y.o. male presenting for his 3 month Skyrizi injection.  The patient informs this is his 3rd injection for his psoriasis. The patient informs he is doing well with the injection at this time. The patient denies any complaints today.  Denies fever, sinus headache, sinus congestion , sinus pain, rhinorrhea, itchy watery eyes, red eyes, ear fullness, ear drainage, sore throat, difficulty swallowing, pain with swallowing, inability to swallow, voice change, dry cough, productive cough, wheezing, shortness of breath, chest tightness, chest pain and myalgia, night sweats, chills, fatigue, malaise, decreased appetite, weight loss, nausea, vomiting, abdominal pain and diarrhea. Denies any contact with anyone that has tested positive for COVID-19 or anyone undergoing testing for COVID-19. Denies recent travel. Denies any other aggravating or relieving factors, no other questions or concerns.  Review of Systems  Constitutional: Negative.   HENT: Negative.   Eyes: Negative.   Respiratory: Negative.   Cardiovascular: Negative.   Gastrointestinal: Negative.   Skin: Negative.     Euriah has a current medication list which includes the following prescription(s): albuterol, aspirin ec, clobetasol cream, fluticasone-umeclidin-vilant, omeprazole, and skyrizi (150 mg dose). Also has No Known Allergies.  [place]  has a past medical history of Anxiety, Arthritis, Diverticulosis, Elevated PSA, Headache(784.0), Iritis, Neuromuscular disorder ([place]), Pain, Prostate cancer ([place]) (03/05/13), Renal cysts, acquired, bilateral (03/19/13), and Urinary frequency. Also  has a past surgical history that includes REMOVAL BURSA [place], LEFT ELBOW (1996); Inguinal hernia repair (Bilateral, AGE 56); Cataract extraction w/ intraocular lens implant (Left); Hydrocele surgery (Left, 03/05/2013); Prostate biopsy (N/A, 03/05/2013); Eye surgery; Tonsillectomy; Robot assisted laparoscopic  radical prostatectomy (N/A, 05/06/2013); Lymphadenectomy (Bilateral, 05/06/2013); and Back surgery.   Objective:   Vitals: BP 140/86 (BP Location: Right Arm, Patient Position: Sitting, Cuff Size: Normal)   Pulse 92   Resp 18  Unable to obtain temperature, weight, pulse ox d/t equipment not provided in office  Physical Exam Vitals signs reviewed.  Constitutional:      General: He is not in acute distress. HENT:     Head: Normocephalic.     Right Ear: External ear normal.     Left Ear: External ear normal.     Nose: Nose normal.     Mouth/Throat:     Mouth: Mucous membranes are moist.  Neck:     Musculoskeletal: Normal range of motion.  Cardiovascular:     Rate and Rhythm: Normal rate.     Pulses: Normal pulses.  Pulmonary:     Effort: Pulmonary effort is normal.  Abdominal:     Palpations: Abdomen is soft.  Skin:    General: Skin is warm and dry.     Capillary Refill: Capillary refill takes less than 2 seconds.  Neurological:     General: No focal deficit present.     Mental Status: He is alert.  Psychiatric:        Mood and Affect: Mood normal.        Thought Content: Thought content normal.   Brings Skyrizi injections with him and 1 injection given to each upper arm SQ and tolerated injections. 150mg  SQ injection (75mg Joanette Gula); Exp: Jul 2021; Lot # [id number]  Assessment and Plan :   Exam findings, diagnosis etiology and medication use and indications reviewed with patient. Follow- Up and discharge instructions provided. No emergent/urgent issues found on exam. Patient education was provided. Patient verbalized understanding of information provided and agrees with plan of care (POC), all questions  answered. The patient is advised to call or return to clinic if condition does not see an improvement in symptoms, or to seek the care of the closest emergency department if condition worsens with the above plan.   Psoriasis:  Follow up with dermatology if psoriasis worsens or  intolerance issues Return to clinic 12 weeks for follow up and next injection

## 2019-06-11 ENCOUNTER — Other Ambulatory Visit: Payer: Self-pay | Admitting: Pulmonary Disease

## 2019-08-05 ENCOUNTER — Ambulatory Visit: Payer: PRIVATE HEALTH INSURANCE | Admitting: Family Medicine

## 2019-08-05 ENCOUNTER — Encounter: Payer: Self-pay | Admitting: Family Medicine

## 2019-08-05 DIAGNOSIS — J439 Emphysema, unspecified: Secondary | ICD-10-CM | POA: Insufficient documentation

## 2019-08-05 DIAGNOSIS — Z87891 Personal history of nicotine dependence: Secondary | ICD-10-CM | POA: Insufficient documentation

## 2019-08-05 DIAGNOSIS — L400 Psoriasis vulgaris: Secondary | ICD-10-CM | POA: Insufficient documentation

## 2019-08-05 NOTE — Progress Notes (Signed)
  Subjective:     Patient ID: David King, male   DOB: [birth year], 63 y.o.   MRN: [id number]  David King presents to the employee wellness clinic today for administration of his Orson Ape injections for his plaque psoriasis, these are prescribed by his dermatologist. He started receiving this treatment in January 2020 and has had good results. He reports his psoriasis has cleared up and he is happy with this treatment. He denies any adverse effects. No s/s infection, no s/s TB. He was tested for TB prior to starting therapy per pt. He last saw his dermatologist in March 2020.     Review of Systems  Constitutional: Negative for chills, fever and unexpected weight change.  HENT: Negative for congestion, ear pain and sore throat.   Eyes: Negative for discharge.  Respiratory: Negative for shortness of breath.   Cardiovascular: Negative for chest pain.  Skin: Negative for color change and rash.  Neurological: Negative for dizziness.       Objective:   Physical Exam Vitals signs reviewed.  Constitutional:      General: He is not in acute distress.    Appearance: Normal appearance. He is not toxic-appearing.  HENT:     Head: Normocephalic and atraumatic.  Cardiovascular:     Rate and Rhythm: Normal rate and regular rhythm.     Heart sounds: Normal heart sounds.  Pulmonary:     Effort: Pulmonary effort is normal. No respiratory distress.     Breath sounds: Normal breath sounds.  Skin:    General: Skin is warm and dry.     Findings: No bruising, erythema, lesion or rash.  Neurological:     General: No focal deficit present.     Mental Status: He is alert and oriented to person, place, and time.  Psychiatric:        Mood and Affect: Mood normal.        Behavior: Behavior normal.    Injection Administration 08/05/19 at 14:15: Skyrizi 75 mg administered SQ to RUA and LUA - Lot # [id number], Exp date: August 2021     Assessment:     Plaque psoriasis      Plan:     1. Skyrizi injection  administered. Pt tolerated well. No adverse reaction noted. He will f/u with his dermatologist should he have any problems. He understands that this medication increases his risk of infection, he will continue safe infection prevention practices.  2. F/u in clinic as needed, return in 12 weeks for skyrizi injection.

## 2019-08-17 ENCOUNTER — Ambulatory Visit: Payer: Self-pay

## 2019-08-17 ENCOUNTER — Ambulatory Visit: Payer: PRIVATE HEALTH INSURANCE | Admitting: Family Medicine

## 2019-08-17 ENCOUNTER — Other Ambulatory Visit: Payer: Self-pay

## 2019-08-17 ENCOUNTER — Other Ambulatory Visit: Payer: Self-pay | Admitting: Family Medicine

## 2019-08-17 VITALS — BP 140/90 | HR 77

## 2019-08-17 DIAGNOSIS — M79642 Pain in left hand: Secondary | ICD-10-CM

## 2019-08-17 NOTE — Progress Notes (Signed)
  Subjective:     Patient ID: David King, male   DOB: 1956-04-05, 63 y.o.   MRN: [id number]  Paublo presents to the Employee health clinic today with complaints of left hand pain that started yesterday after he opened a box at work. No other known injury. No new repetitive activities. He is left-handed. Pain is sharp and located to left thenar area, tender to touch. Denies radiation of pain, no numbness or tingling. Difficulty gripping objects d/t pain. States he took a ibuprofen PM last night which helped him sleep. Has not taken anything today.   Hand Pain  Pertinent negatives include no numbness.     Review of Systems  Musculoskeletal: Negative for joint swelling.  Neurological: Negative for numbness.  See HPI     Objective:   Physical Exam Vitals signs reviewed.  Constitutional:      General: He is not in acute distress.    Appearance: Normal appearance. He is not toxic-appearing.  HENT:     Head: Normocephalic and atraumatic.  Pulmonary:     Effort: Pulmonary effort is normal. No respiratory distress.  Musculoskeletal:        General: Tenderness present. No swelling.     Comments: Tender to palpation over left thenar muscle. Radial pulse 2+. No pain to fingers or thumb. No numbness. Grip strength is reduced on left side in comparison to right.   Skin:    General: Skin is warm and dry.  Neurological:     Mental Status: He is alert.        Assessment:     Pain of left hand     Plan:     1. Discussed with patient that since this injury occurred at work he will need to be seen by occupational health to have it evaluated further. Recommend he take ibuprofen to help with the pain in the mean time. Pt given occupational health clinic information and authorization to treat form.

## 2019-08-17 NOTE — Patient Instructions (Signed)
Please go to Occupational Health for further evaluation and treatment. [address]

## 2019-08-26 ENCOUNTER — Ambulatory Visit (INDEPENDENT_AMBULATORY_CARE_PROVIDER_SITE_OTHER)
Admission: RE | Admit: 2019-08-26 | Discharge: 2019-08-26 | Disposition: A | Discharge status: Home / Self Care | Payer: PRIVATE HEALTH INSURANCE | Source: Ambulatory Visit | Attending: Acute Care | Admitting: Acute Care

## 2019-08-26 ENCOUNTER — Other Ambulatory Visit: Payer: Self-pay

## 2019-08-26 DIAGNOSIS — Z122 Encounter for screening for malignant neoplasm of respiratory organs: Secondary | ICD-10-CM | POA: Diagnosis not present

## 2019-08-26 DIAGNOSIS — Z87891 Personal history of nicotine dependence: Secondary | ICD-10-CM | POA: Diagnosis not present

## 2019-09-01 ENCOUNTER — Other Ambulatory Visit: Payer: Self-pay | Admitting: *Deleted

## 2019-09-01 ENCOUNTER — Telehealth: Payer: Self-pay | Admitting: Acute Care

## 2019-09-01 DIAGNOSIS — Z122 Encounter for screening for malignant neoplasm of respiratory organs: Secondary | ICD-10-CM

## 2019-09-01 DIAGNOSIS — Z87891 Personal history of nicotine dependence: Secondary | ICD-10-CM

## 2019-09-01 NOTE — Telephone Encounter (Signed)
error 

## 2019-09-07 ENCOUNTER — Other Ambulatory Visit: Payer: Self-pay | Admitting: Pulmonary Disease

## 2019-09-30 ENCOUNTER — Other Ambulatory Visit: Payer: Self-pay

## 2019-09-30 ENCOUNTER — Ambulatory Visit (INDEPENDENT_AMBULATORY_CARE_PROVIDER_SITE_OTHER): Payer: PRIVATE HEALTH INSURANCE | Admitting: Pulmonary Disease

## 2019-09-30 ENCOUNTER — Encounter: Payer: Self-pay | Admitting: Pulmonary Disease

## 2019-09-30 VITALS — BP 120/68 | HR 88 | Temp 98.0°F | Ht 69.0 in | Wt 211.8 lb

## 2019-09-30 DIAGNOSIS — J449 Chronic obstructive pulmonary disease, unspecified: Secondary | ICD-10-CM | POA: Diagnosis not present

## 2019-09-30 NOTE — Progress Notes (Signed)
STELLAN FERMAN    [id number]    December 22, 1956  Primary Care Physician:[name], Dwyane Luo, MD  Referring Physician: [name], MD [place],  [place] [address]  Chief complaint: Follow up for COPD, lung nodules  HPI: 63 year old former smoker with history of psoriasis, prostate cancer, arthritis, anxiety, migraines, diverticulosis. He has complaints of progressive dyspnea on exertion for the past 10 months.  He is symptomatic with rest and as well.  Denies any cough, sputum production, wheezing. He was seen in the ED on 11/03/17 with an x-ray that was negative for any acute abnormality.  He did not have any interventions done.  Evaluated by cardiology at [facility] for unstable angina.  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  Quit in January 2018 Travel History: not significant  Interim history: Continues on Trelegy inhaler.  Feels that this is working better for him than Anoro No new complaints today.  Outpatient Encounter Medications as of 09/30/2019  Medication Sig  . clobetasol cream (TEMOVATE) 0.05 %   . omeprazole (PRILOSEC) 20 MG capsule Take by mouth.  Marland Kitchen PROAIR HFA 108 (90 Base) MCG/ACT inhaler USE 1-2 PUFFS EVERY 6 HOURS AS NEEDED FOR WHEEZING OR SHORTNESS OF BREATH.  . SKYRIZI, 150 MG DOSE, 75 MG/0.83ML PSKT   . TRELEGY ELLIPTA 100-62.5-25 MCG/INH AEPB INHALE 1 PUFF INTO THE LUNGS DAILY.  . [DISCONTINUED] aspirin EC 81 MG tablet Take by mouth.   No facility-administered encounter medications on file as of 09/30/2019.    Physical Exam: Blood pressure 136/78, pulse 79, height 5\' 9"  (1.753 m), weight 219 lb 9.6 oz (99.6 kg), SpO2 96 %. Gen:      No acute distress HEENT:  EOMI, sclera anicteric  Neck:     No masses; no thyromegaly Lungs:    Clear to auscultation bilaterally; normal respiratory effort CV:         Regular rate and rhythm; no murmurs Abd:      + bowel sounds; soft, non-tender; no palpable masses, no distension Ext:    No edema; adequate peripheral perfusion Skin:      Warm and dry; no rash Neuro: alert and oriented x 3 Psych: normal mood and affect  Data Reviewed: Imaging CT abdomen 03/19/13-visualized lung bases are clear. Screening CT chest 11/14/17-mild emphysema with lower lobe bronchiectasis.  3.5 mm nodule in left upper lobe. Screening CT chest 11/18/2018- new irregular 7 mm nodule in the left upper lobe.  3.5 mm nodule is stable Screening CT chest 02/19/2019- 7 mm nodule previously identified has most completely resolved.  There is a new left upper lobe nodule measuring 5.7 mm. Screening CT chest 08/26/2019- near resolution of left upper lobe nodule.  Additional pulmonary nodules are stable I have reviewed all images personally.  PFTs 12/11/17 FVC 3.93 (87%], FEV1 2.67 [79%], F/F 68, TLC 113%, RV/TLC 147%, DLCO 78% Moderate obstruction with minimal diffusion defect.  Positive bronchodilator effect. Air trapping.  Labs CBC 06/18/2018-WBC 8.4, eos 0.9%, absolute eosinophil count 76 Alpha-1 antitrypsin 06/26/2018-157, PI MM Blood allergy profile 06/26/2018-IgE 130, RAST panel negative  Assessment:  COPD PFTs reviewed which showed moderate- obstruction with bronchodilator response.   Continues on Trelegy inhaler.  Not clear to me if he needs  inhaled steroids as peripheral eosinophils are low however he feels this works better for him than the Anoro.  GERD Continue on PPI  Pulmonary nodules Follow-up CT in 1 year.  Health maintenance Does not want influenza vaccine 02/23/2019-Pneumovax  Plan/Recommendations: - Continue Trelegy - Continue annual low-dose screening CT of the chest.  [name] MD [facility] 09/30/2019, 2:41  PM  CC: [name], MD

## 2019-09-30 NOTE — Patient Instructions (Signed)
I am glad that your breathing is doing stable Continue the Trelegy as prescribed Follow-up in 1 year.

## 2019-10-06 ENCOUNTER — Other Ambulatory Visit: Payer: Self-pay | Admitting: Pulmonary Disease

## 2019-11-04 ENCOUNTER — Ambulatory Visit: Payer: Self-pay | Admitting: Family Medicine

## 2019-11-04 ENCOUNTER — Other Ambulatory Visit: Payer: Self-pay | Admitting: Pulmonary Disease

## 2019-11-04 VITALS — BP 148/90 | HR 62 | Resp 18

## 2019-11-04 NOTE — Progress Notes (Signed)
  Subjective:     Patient ID: David King, male   DOB: [birth year], 63 y.o.   MRN: [id number]  HPI David King presents to the [facility] clinic today for his Skyrizi injections. He receives these for treatment of his psoriasis every 12 weeks. Tolerates well without problem. Denies any s/s infection. Skin remains clear on treatment.   Review of Systems  Constitutional: Negative for chills and fever.  HENT: Negative for congestion, ear pain, sinus pain and sore throat.   Respiratory: Negative for cough, chest tightness, shortness of breath and wheezing.   Skin: Negative for color change and rash.       Objective:   Physical Exam Vitals signs reviewed.  Constitutional:      General: He is not in acute distress.    Appearance: Normal appearance. He is not toxic-appearing.  HENT:     Head: Normocephalic and atraumatic.  Eyes:     General:        Right eye: No discharge.        Left eye: No discharge.  Cardiovascular:     Rate and Rhythm: Normal rate and regular rhythm.     Heart sounds: Normal heart sounds.  Pulmonary:     Effort: Pulmonary effort is normal. No respiratory distress.     Breath sounds: Normal breath sounds.  Skin:    General: Skin is warm and dry.     Findings: No rash.  Neurological:     Mental Status: He is alert and oriented to person, place, and time.  Psychiatric:        Mood and Affect: Mood normal.        Behavior: Behavior normal.    Procedure Note: Skyrizi 75 mg injected SQ to RUQ and LUA - Lot #[id number], Exp Date: Oct 2021. Tolerated well     Assessment:     Plaque psoriasis      Plan:     1. Skyrizi injection administered to bilateral upper arms. Pt tolerated well. No adverse reaction noted. He will f/u with his dermatologist should he have any problems. He will monitor for s/s infection as this medication increases his risk of infection. Continue good hand hygiene and infection prevention practices.  2. F/u in clinic as needed, return in 12 weeks for  skyrizi injection.

## 2019-11-10 ENCOUNTER — Telehealth: Payer: Self-pay | Admitting: Pulmonary Disease

## 2019-11-10 NOTE — Telephone Encounter (Signed)
Refill for pt's trelegy has been sent to pharmacy. Nothing further needed.

## 2019-12-31 DIAGNOSIS — I10 Essential (primary) hypertension: Secondary | ICD-10-CM

## 2019-12-31 HISTORY — DX: Essential (primary) hypertension: I10

## 2020-01-20 ENCOUNTER — Other Ambulatory Visit: Payer: Self-pay | Admitting: Family Medicine

## 2020-01-20 ENCOUNTER — Encounter: Payer: Self-pay | Admitting: Family Medicine

## 2020-01-20 ENCOUNTER — Ambulatory Visit: Payer: Self-pay | Admitting: Family Medicine

## 2020-01-20 VITALS — BP 184/102 | HR 76 | Resp 18

## 2020-01-20 NOTE — Progress Notes (Signed)
  Subjective:     Patient ID: David King, male   DOB: [birth year], 64 y.o.   MRN: [id number]  HPI David King presents to the employee health clinic today for his Skyrizi injections and his required wellness visit for insurance. He is doing well with the Skyrizi injections, states his psoriasis is under good control. He denies any s/s infection.   PMH was reviewed. He has a PCP, Dr. Harrington Challenger at [facility], that he needs to go see. States it has been awhile since he has seen him. He does see his pulmonologist when he has to. He has never had a colonoscopy. His PSA levels are checked q6 months. He states it has been a long time since his cholesterol has been checked.    Review of Systems  Constitutional: Negative for chills, fatigue, fever and unexpected weight change.  HENT: Negative for congestion, ear pain, sinus pressure, sinus pain and sore throat.   Eyes: Negative for discharge and visual disturbance.  Respiratory: Negative for cough, shortness of breath and wheezing.   Cardiovascular: Negative for chest pain and leg swelling.  Gastrointestinal: Negative for abdominal pain, blood in stool, constipation, diarrhea, nausea and vomiting.  Genitourinary: Negative for difficulty urinating and hematuria.  Skin: Negative for color change.  Neurological: Negative for dizziness, weakness, light-headedness and headaches.  Hematological: Negative for adenopathy.  All other systems reviewed and are negative.      Objective:   Physical Exam Vitals reviewed.  Constitutional:      General: He is not in acute distress.    Appearance: Normal appearance. He is not toxic-appearing.  HENT:     Head: Normocephalic and atraumatic.  Eyes:     General:        Right eye: No discharge.        Left eye: No discharge.  Cardiovascular:     Rate and Rhythm: Normal rate.  Pulmonary:     Effort: Pulmonary effort is normal. No respiratory distress.  Skin:    General: Skin is warm and dry.  Neurological:     Mental  Status: He is alert and oriented to person, place, and time.  Psychiatric:        Mood and Affect: Mood normal.        Behavior: Behavior normal.    Syrizi injections given SQ to bilateral upper arms. Lot #[id number]; Exp: Dec 2021. Tolerated well. No problems.      Assessment:     1. Participant in health and wellness plan Encouraged patient to schedule a physical with his PCP. Lengthy discussion regarding need for colonoscopy, and encouraged him to get this done as soon as possible. We will check his cholesterol and CMP today. Will notify of results.   2. Plaque psoriasis Skyrizi injections given today. Tolerated well. Discussed increased risk of infection with this medication, monitor for s/s. F/u prn.   3. Elevated BP without diagnosis of hypertension Pt reports his BP is usually normal. No hx of HTN. He states it has been a stressful year. No concerning s/s noted. Recommend DASH diet. Recommend f/u in 1 week to recheck BP. If remains elevated will need to discuss starting on medication to help get it under better control.      Plan:       See noted under assessment.

## 2020-01-21 LAB — LIPID PANEL
Cholesterol: 217 mg/dL — ABNORMAL HIGH (ref <200)
HDL: 37 mg/dL — ABNORMAL LOW (ref >=40)
LDL Cholesterol (Calc): 157 mg/dL (calc) — ABNORMAL HIGH
Non-HDL Cholesterol (Calc): 180 mg/dL (calc) — ABNORMAL HIGH (ref <130)
Total CHOL/HDL Ratio: 5.9 (calc) — ABNORMAL HIGH (ref <5.0)
Triglycerides: 115 mg/dL (ref <150)

## 2020-01-21 LAB — COMPLETE METABOLIC PANEL WITH GFR
AG Ratio: 1.2 (calc) (ref 1.0–2.5)
ALT: 57 U/L — ABNORMAL HIGH (ref 9–46)
AST: 32 U/L (ref 10–35)
Albumin: 4.2 g/dL (ref 3.6–5.1)
Alkaline phosphatase (APISO): 58 U/L (ref 35–144)
BUN: 14 mg/dL (ref 7–25)
CO2: 28 mmol/L (ref 20–32)
Calcium: 9.5 mg/dL (ref 8.6–10.3)
Chloride: 103 mmol/L (ref 98–110)
Creat: 1.03 mg/dL (ref 0.70–1.25)
GFR, Est African American: 88 mL/min/{1.73_m2} (ref >=60)
GFR, Est Non African American: 76 mL/min/{1.73_m2} (ref >=60)
Globulin: 3.4 g/dL (calc) (ref 1.9–3.7)
Glucose, Bld: 107 mg/dL — ABNORMAL HIGH (ref 65–99)
Potassium: 4.4 mmol/L (ref 3.5–5.3)
Sodium: 140 mmol/L (ref 135–146)
Total Bilirubin: 0.6 mg/dL (ref 0.2–1.2)
Total Protein: 7.6 g/dL (ref 6.1–8.1)

## 2020-01-27 ENCOUNTER — Encounter: Payer: Self-pay | Admitting: Family Medicine

## 2020-01-27 ENCOUNTER — Ambulatory Visit: Payer: Self-pay | Admitting: Family Medicine

## 2020-01-27 VITALS — BP 158/100

## 2020-01-27 DIAGNOSIS — I10 Essential (primary) hypertension: Secondary | ICD-10-CM | POA: Insufficient documentation

## 2020-01-27 MED ORDER — LISINOPRIL 10 MG PO TABS: 10.0000 mg | ORAL_TABLET | Freq: Every day | ORAL | 1 refills | Status: DC

## 2020-01-27 NOTE — Progress Notes (Signed)
Subjective:     Patient ID: David King, male   DOB: [birth year], 64 y.o.   MRN: [id number]  HPI David King presents to the employee health clinic today for a recheck of his BP since it was elevated last week and has been elevated in past visits. He also reports having a h/a with some nausea today, as well as a nosebleed this morning that he said took about 30 minutes to stop. He denies chest pain, shortness of breath, syncope, vision changes. He has had a recent eye exam. He states he scheduled a physical with his PCP on 3/9.   Review of Systems  Constitutional: Negative for chills, fever and unexpected weight change.  HENT: Negative for congestion, ear pain, sinus pressure and sinus pain.   Eyes: Negative for pain and visual disturbance.  Respiratory: Negative for cough, shortness of breath and wheezing.   Cardiovascular: Negative for chest pain and palpitations.  Gastrointestinal: Positive for nausea. Negative for abdominal pain and vomiting.  Skin: Negative for color change.  Neurological: Positive for headaches. Negative for syncope, facial asymmetry, speech difficulty, weakness and numbness.       Objective:   Physical Exam Vitals reviewed.  Constitutional:      General: He is not in acute distress.    Appearance: Normal appearance. He is not toxic-appearing.  HENT:     Head: Normocephalic and atraumatic.  Eyes:     General:        Right eye: No discharge.        Left eye: No discharge.     Extraocular Movements: Extraocular movements intact.     Pupils: Pupils are equal, round, and reactive to light.  Pulmonary:     Effort: Pulmonary effort is normal. No respiratory distress.  Musculoskeletal:     Cervical back: Neck supple.  Skin:    General: Skin is warm and dry.  Neurological:     General: No focal deficit present.     Mental Status: He is alert and oriented to person, place, and time. Mental status is at baseline.     Gait: Gait normal.  Psychiatric:        Mood and  Affect: Mood normal.        Behavior: Behavior normal.        Assessment:     1. Essential hypertension BP elevated again today at 158/100. Pt also symptomatic with h/a and epistaxis today. Given his hx and risk factors for cardiovascular disease recommend we start medication management today, along with lifestyle changes. Pt is in agreement with this plan, though he states he does not like taking medications. Recommend DASH diet and exercise as tolerated. Will start lisinopril 10 mg daily. He will f/u with his PCP on 3/9 who will take over management of this at that time. I recommend he keep a log of his BP at home and bring that with him to his PCP visit. Should he have any problems with the medication prior to his pcp visit he will f/u here with me. Should he develop severe h/a, N/V, chest pain, shortness of breath or any other concerning symptoms he should go to the ED. He verbalized understanding.   2. Pure hypercholesterolemia Recent lab results printed and reviewed with patient. His cholesterol is elevated. His current ASCVD 10 year risk is 22.2%. Discussed with patient that he is at high risk of having a heart attack or stroke based on these numbers and his age and smoking hx. Recommend he  start on statin therapy now. He is resistant to starting medication. He wants to wait and discuss this further with his PCP in March. Encouraged lifestyle changes of healthy diet and exercise in the meantime.    3. Elevated fasting glucose Discussed that elevated glucose indicates a pre-diabetes range. Recommend this be f/u with a A1C level. He would like to discuss this further with his PCP first. Encouraged him to get this f/u on so we can prevent onset of diabetes.      Plan:     See above.

## 2020-01-28 ENCOUNTER — Telehealth: Payer: Self-pay | Admitting: Pulmonary Disease

## 2020-01-28 NOTE — Telephone Encounter (Signed)
I would advise him to keep taking the Trelegy as we do not want his COPD to get worse

## 2020-01-28 NOTE — Telephone Encounter (Signed)
Called the patient and advised of the response from Dr. Vaughan Browner. Patient stated the NP at his place of work issued the lisinopril. Advised to keep an eye on his BP  for the next couple of weeks or so because he has to allow the medication time to work and to also watch his salt intake as well. If he finds the medication is not helping his BP, he needs to contact the NP that issued the medication to let her know.  Patient voiced understanding, nothing further needed at this time.

## 2020-01-28 NOTE — Telephone Encounter (Signed)
Called and spoke with pt. Pt said he has been having some issues with high blood pressure. Pt stated his BP today was 151/99 but a week ago his BP was higher than that. Pt just started taking lisinopril 10mg  today for his BP.  Pt wants to know if it is okay for him to continue taking his trelegy due to his BP. Dr. Vaughan Browner, please advise.

## 2020-03-15 ENCOUNTER — Other Ambulatory Visit: Payer: Self-pay

## 2020-03-15 ENCOUNTER — Encounter: Payer: Self-pay | Admitting: Orthopaedic Surgery

## 2020-03-15 ENCOUNTER — Ambulatory Visit (INDEPENDENT_AMBULATORY_CARE_PROVIDER_SITE_OTHER): Payer: PRIVATE HEALTH INSURANCE

## 2020-03-15 ENCOUNTER — Ambulatory Visit (INDEPENDENT_AMBULATORY_CARE_PROVIDER_SITE_OTHER): Payer: PRIVATE HEALTH INSURANCE | Admitting: Orthopaedic Surgery

## 2020-03-15 VITALS — Ht 70.0 in | Wt 210.0 lb

## 2020-03-15 DIAGNOSIS — M25552 Pain in left hip: Secondary | ICD-10-CM

## 2020-03-15 DIAGNOSIS — M25559 Pain in unspecified hip: Secondary | ICD-10-CM

## 2020-03-15 NOTE — Addendum Note (Signed)
Addended by: Michae Kava B on: 03/15/2020 04:12 PM   Modules accepted: Orders

## 2020-03-15 NOTE — Progress Notes (Signed)
Office Visit Note   Patient: David King           Date of Birth: 1956/04/05           MRN: [id number] Visit Date: 03/15/2020              Requested by: [name], [place],  [place] [id number] PCP: [name], MD   Assessment & Plan: Visit Diagnoses:  1. Hip pain     Plan: Due to patient's worsening pain radiographic findings with cystic changes involving the proximal femoral head along with calcific changes just inferior to the femoral head recommend MRI to rule out AVN and also evaluate articular cartilage.  He will follow-up after the MRI to go over results and discuss further treatment.  Questions encouraged and answered by Dr. Ninfa Linden and myself.  Follow-Up Instructions: Return After MRI.   Orders:  Orders Placed This Encounter  Procedures  . XR HIP UNILAT W OR W/O PELVIS 1V LEFT   No orders of the defined types were placed in this encounter.     Procedures: No procedures performed   Clinical Data: No additional findings.   Subjective: Chief Complaint  Patient presents with  . Left Hip - Pain    HPI David King is a 64 year old male who were seen for the first time for left hip pain.  He states his had left hip pain for the past 5 to 6 years.  He states the pain is getting worse.  Pain is worse with standing for prolonged period of time or walking for any short period of time.  He denies any groin pain but has lateral left hip pain.  He states that he had hip pain prior to undergoing surgery by a physician in [facility] for his back.  He underwent bilateral minimally invasive decompression lumbar laminectomy at L2-3, L3-4, L4-5 and L5-S1.  He states that he still has the same pain that he was having prior to back surgery.  No known injury to the left hip.  He drinks 1-2 beverages daily.  Denies any previous steroid use.  Quit smoking 2 years ago. Review of Systems Negative for fevers chills shortness of breath chest  pain  Objective: Vital Signs: Ht 5\' 10"  (1.778 m)   Wt 210 lb (95.3 kg)   BMI 30.13 kg/m   Physical Exam Constitutional:      Appearance: He is not ill-appearing or diaphoretic.  Pulmonary:     Effort: Pulmonary effort is normal.  Neurological:     Mental Status: He is alert and oriented to person, place, and time.  Psychiatric:        Mood and Affect: Mood normal.        Behavior: Behavior normal.     Ortho Exam Bilateral hips good range of motion.  Pain with internal rotation of the left hip.  Nontender over trochanteric region of both hips.  Ambulates without any assistive device. Specialty Comments:  No specialty comments available.  Imaging: XR HIP UNILAT W OR W/O PELVIS 1V LEFT  Result Date: 03/15/2020 AP pelvis lateral view of left hip: Lateral hips well located.  Hip joints overall well-preserved.  Left hip with cystic changes proximal femoral head.  Calcific changes just inferior to the femoral head somewhat in a linear appearance.  Otherwise no bony abnormalities.  No acute fractures.    PMFS History: Patient Active Problem List   Diagnosis Date Noted  . Essential hypertension 01/27/2020  .  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  . Former smoker 08/05/2019  . Pulmonary emphysema ([place]) 08/05/2019  . Prostate cancer [facility])    Past Medical History:  Diagnosis Date  . Anxiety    new dx  . Arthritis    lumbar  . Diverticulosis   . Elevated PSA   . Headache(784.0)    MIGRAINES  . Iritis    CHRONIC IN LEFT EYE - SOME VISIAL IMPAIRMENT IN LEFT EYE  . Neuromuscular disorder ([place])    left leg/foot,pinched siactic nerve  . Pain    PAIN LEFT HIP AND DOWN LT LEG WITH NUMBNESS IN LEFT LEG--PT STATES SCIATIC NERVE IMPINGEMENT - PT PLANS BACK IN THE NEAR SURGERY.  . Prostate cancer ([place]) 03/05/13   Adenocarcinoma  . Renal cysts, acquired, bilateral 03/19/13   several simple , CT  . Urinary frequency    AND NOCTURIA    Family History  Problem Relation Age of Onset  .  Cancer Father        prostate cancer  . Cancer Paternal Grandfather        prostate    Past Surgical History:  Procedure Laterality Date  . BACK SURGERY    . CATARACT EXTRACTION W/ INTRAOCULAR LENS IMPLANT Left   . EYE SURGERY     RETINAL SURGERY LEFT EYE  . HYDROCELE EXCISION Left 03/05/2013   Procedure: HYDROCELECTOMY ADULT;  Surgeon: Fredricka Bonine, MD;  Location: [facility];  Service: Urology;  Laterality: Left;  . INGUINAL HERNIA REPAIR Bilateral AGE 64  . LYMPHADENECTOMY Bilateral 05/06/2013   Procedure: LYMPHADENECTOMY;  Surgeon: Dutch Gray, MD;  Location: [facility];  Service: Urology;  Laterality: Bilateral;  . PROSTATE BIOPSY N/A 03/05/2013   Procedure: PROSTATE BIOPSY and ultrasound;  Surgeon: Fredricka Bonine, MD;  Location: [facility];  Service: Urology;  Laterality: N/A;  . REMOVAL BURSA [place], LEFT ELBOW  1996  . ROBOT ASSISTED LAPAROSCOPIC RADICAL PROSTATECTOMY N/A 05/06/2013   Procedure: ROBOTIC ASSISTED LAPAROSCOPIC RADICAL PROSTATECTOMY LEVEL 3;  Surgeon: Dutch Gray, MD;  Location: [facility];  Service: Urology;  Laterality: N/A;  . TONSILLECTOMY     as achild   Social History   Occupational History  . Not on file  Tobacco Use  . Smoking status: Former Smoker    Packs/day: 2.00    Years: 47.00    Pack years: 94.00    Types: Cigarettes    Quit date: 12/31/2016    Years since quitting: 3.2  . Smokeless tobacco: Never Used  . Tobacco comment: Patient is currently smoke free since January 2018  Substance and Sexual Activity  . Alcohol use: Yes    Alcohol/week: 8.0 standard drinks    Types: 8 Standard drinks or equivalent per week    Comment: 3 DRINKS A DAY  . Drug use: No  . Sexual activity: Never

## 2020-04-05 ENCOUNTER — Ambulatory Visit: Payer: PRIVATE HEALTH INSURANCE | Admitting: Orthopaedic Surgery

## 2020-04-13 ENCOUNTER — Ambulatory Visit: Payer: PRIVATE HEALTH INSURANCE | Admitting: Family Medicine

## 2020-04-13 ENCOUNTER — Encounter: Payer: Self-pay | Admitting: Family Medicine

## 2020-04-13 ENCOUNTER — Other Ambulatory Visit: Payer: Self-pay

## 2020-04-13 VITALS — BP 132/88 | HR 80

## 2020-04-13 DIAGNOSIS — L400 Psoriasis vulgaris: Secondary | ICD-10-CM

## 2020-04-13 DIAGNOSIS — I10 Essential (primary) hypertension: Secondary | ICD-10-CM

## 2020-04-13 NOTE — Progress Notes (Signed)
Subjective:     Patient ID: David King, male   DOB: 30-Sep-1956, 64 y.o.   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 He reports tolerating injections well, states psoriasis symptoms are under good control with medication. Gets injections q12 weeks.   He has followed up with his PCP. States his BP medication was increased to 20 mg daily. He is tolerating well. States home BP have been in the 120s/80s. Denies any adverse effects. He also reports he is seeing an orthopedic specialist for his hip pain and will get an MRI this month.   Past Medical History:  Diagnosis Date  . Anxiety    new dx  . Arthritis    lumbar  . Diverticulosis   . Elevated PSA   . Headache(784.0)    MIGRAINES  . Iritis    CHRONIC IN LEFT EYE - SOME VISIAL IMPAIRMENT IN LEFT EYE  . Neuromuscular disorder ([place])    left leg/foot,pinched siactic nerve  . Pain    PAIN LEFT HIP AND DOWN LT LEG WITH NUMBNESS IN LEFT LEG--PT STATES SCIATIC NERVE IMPINGEMENT - PT PLANS BACK IN THE NEAR SURGERY.  . 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 Renal cysts, acquired, bilateral 03/19/13   several simple , CT  . Urinary frequency    AND NOCTURIA   No Known Allergies  Current Outpatient Medications:  .  albuterol (VENTOLIN HFA) 108 (90 Base) MCG/ACT inhaler, USE 1-2 PUFFS EVERY 6 HOURS AS NEEDED FOR WHEEZING OR SHORTNESS OF BREATH., Disp: 8.5 g, Rfl: 2 .  clobetasol cream (TEMOVATE) 0.05 %, , Disp: , Rfl:  .  lisinopril (ZESTRIL) 20 MG tablet, Take 20 mg by mouth daily., Disp: , Rfl:  .  omeprazole (PRILOSEC) 20 MG capsule, Take by mouth., Disp: , Rfl:  .  SKYRIZI, 150 MG DOSE, 75 MG/0.83ML PSKT, , Disp: , Rfl: 0 .  TRELEGY ELLIPTA 100-62.5-25 MCG/INH AEPB, INHALE 1 PUFF INTO THE LUNGS DAILY., Disp: 60 each, Rfl: 5   Review of Systems  Constitutional: Negative for chills and fever.  Respiratory: Negative for cough and  shortness of breath.   Cardiovascular: Negative for chest pain.  Skin: Negative for color change and rash.       Objective:   Physical Exam Vitals reviewed.  Constitutional:      General: He is not in acute distress.    Appearance: Normal appearance.  HENT:     Head: Normocephalic and atraumatic.  Cardiovascular:     Rate and Rhythm: Normal rate.  Pulmonary:     Effort: Pulmonary effort is normal. No respiratory distress.  Skin:    General: Skin is warm and dry.  Neurological:     Mental Status: He is alert and oriented to person, place, and time.  Psychiatric:        Mood and Affect: Mood normal.        Behavior: Behavior normal.    Today's Vitals   04/13/20 0922  BP: 132/88  Pulse: 80  SpO2: 97%   There is no height or weight on file to calculate BMI.   Skyrizi 75 mg injection given SQ to bilateral upper arms. Exp: Sep 2022; Lot #: [id number]. Tolerated well.      Assessment:     Plaque psoriasis  Essential hypertension      Plan:     1. Tolerated Skyrizi injections well. F/u if any problems  or concerns. RTC in 12 weeks for repeat injections.  2. BP under good control today. Continue with current medication. Keep all regular f/u appt with PCP.  3. F/u here prn.

## 2020-04-17 ENCOUNTER — Other Ambulatory Visit: Payer: Self-pay

## 2020-04-17 ENCOUNTER — Telehealth: Payer: Self-pay | Admitting: Orthopaedic Surgery

## 2020-04-17 DIAGNOSIS — Z96611 Presence of right artificial shoulder joint: Secondary | ICD-10-CM

## 2020-04-17 NOTE — Telephone Encounter (Signed)
I put an order in for right shoulder I am sure it can't be done, but I am sending it to you just incase

## 2020-04-17 NOTE — Telephone Encounter (Signed)
Obviously can't have this same day most likely, but do you want me to add

## 2020-04-17 NOTE — Telephone Encounter (Signed)
Patient called asked if he can have an MRI done on his right shoulder on 04/22/2020 as well? Patient said he is having some pain in his right shoulder. The number to contact patient is [phone]

## 2020-04-17 NOTE — Telephone Encounter (Signed)
You can certainly try to add on a MRI of the right shoulder to rule out a rotator cuff tear.

## 2020-04-20 ENCOUNTER — Telehealth: Payer: Self-pay | Admitting: Orthopaedic Surgery

## 2020-04-20 NOTE — Telephone Encounter (Signed)
What can you see on this one? This is the one I sent you earlier this week to add MRI and see if we could get them together?

## 2020-04-20 NOTE — Telephone Encounter (Signed)
Patient called.   His shoulder is in severe pain and he wanted to know if an MRI could be done on it at the same time as his upcoming MRI for his hip.  Call back: [phone]

## 2020-04-20 NOTE — Telephone Encounter (Signed)
Pt called back and aware of appt scheduled

## 2020-04-20 NOTE — Telephone Encounter (Signed)
Could not get both done on same day as hip on the 24th, if pt wants to have both done it will be mid may around the 17th, if only one then he can be scheduled on the 29th, I scheduled pt on the 29th of April at [facility] imaging to arrive at 950am for a 10:10 am appt.   I called pt and left message to return my call for appt information

## 2020-04-22 ENCOUNTER — Other Ambulatory Visit: Payer: Self-pay

## 2020-04-22 ENCOUNTER — Ambulatory Visit
Admission: RE | Admit: 2020-04-22 | Discharge: 2020-04-22 | Disposition: A | Discharge status: Home / Self Care | Payer: PRIVATE HEALTH INSURANCE | Source: Ambulatory Visit | Attending: Physician Assistant | Admitting: Physician Assistant

## 2020-04-22 DIAGNOSIS — M25559 Pain in unspecified hip: Secondary | ICD-10-CM

## 2020-04-26 ENCOUNTER — Other Ambulatory Visit: Payer: Self-pay

## 2020-04-26 ENCOUNTER — Ambulatory Visit (INDEPENDENT_AMBULATORY_CARE_PROVIDER_SITE_OTHER): Payer: PRIVATE HEALTH INSURANCE | Admitting: Orthopaedic Surgery

## 2020-04-26 ENCOUNTER — Encounter: Payer: Self-pay | Admitting: Orthopaedic Surgery

## 2020-04-26 DIAGNOSIS — M25552 Pain in left hip: Secondary | ICD-10-CM | POA: Diagnosis not present

## 2020-04-26 NOTE — Progress Notes (Signed)
The patient is following up after having a MRI of his left hip.  He does report left hip pain but not in the groin at all.  This is been going on for about 6 or 7 years now.  He does have a history of minimally invasive spine surgery.  He is 64 years old.  He states that this is definitely affecting his quality of life and his mobility and is getting worse.  On exam he gets up on exam table easily.  I can put his left hip through full internal and external rotation with not any significant blocks to rotation and no significant pain in the groin at all.  Some of his pain is over the trochanteric area of the left hip and some over the tensor fascia area.  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  There is no associated edema with this and there may be just a slight degenerative labral tearing but this is slight.  There is no evidence of muscle tears or tenderness or ligamentous disruptions other than with the thinning of the labrum and a small nidus of AVN.  This is been going on for 7 years and it certainly may be related to his spine.  I would like to have Dr. Ernestina Patches provide a one-time intra-articular steroid injection in his left hip joint for hopefully diagnostic and therapeutic purposes.  The patient agrees with this treatment plan.  I do feel that this is worth trying to really get an idea of what is going on for diagnostic purposes.  Once he has this appointment and sees Dr. Ernestina Patches I would like to have Dr. Ernestina Patches send him back to see me in about 2 weeks after that injection.  All questions and concerns were answered and addressed.

## 2020-04-27 ENCOUNTER — Other Ambulatory Visit: Payer: PRIVATE HEALTH INSURANCE

## 2020-05-12 ENCOUNTER — Other Ambulatory Visit: Payer: Self-pay | Admitting: Pulmonary Disease

## 2020-05-24 ENCOUNTER — Other Ambulatory Visit: Payer: Self-pay

## 2020-05-24 ENCOUNTER — Ambulatory Visit (INDEPENDENT_AMBULATORY_CARE_PROVIDER_SITE_OTHER): Payer: PRIVATE HEALTH INSURANCE | Admitting: Physical Medicine and Rehabilitation

## 2020-05-24 ENCOUNTER — Ambulatory Visit: Payer: Self-pay

## 2020-05-24 DIAGNOSIS — M25552 Pain in left hip: Secondary | ICD-10-CM | POA: Diagnosis not present

## 2020-05-24 MED ORDER — BUPIVACAINE HCL 0.25 % IJ SOLN
4.0000 mL | INTRAMUSCULAR | Status: AC | PRN
  Administered 2020-05-24: 4 mL via INTRA_ARTICULAR

## 2020-05-24 MED ORDER — TRIAMCINOLONE ACETONIDE 40 MG/ML IJ SUSP
60.0000 mg | INTRAMUSCULAR | Status: AC | PRN
  Administered 2020-05-24: 60 mg via INTRA_ARTICULAR

## 2020-05-24 NOTE — Progress Notes (Signed)
Numeric Pain Rating Scale and Functional Assessment Average Pain 8-9   In the last MONTH (on 0-10 scale) has pain interfered with the following?  1. General activity like being  able to carry out your everyday physical activities such as walking, climbing stairs, carrying groceries, or moving a chair?  Rating(8-9)   +Driver, -BT, -Dye Allergies. Pain in the left hip when he is on his feet.

## 2020-05-24 NOTE — Progress Notes (Signed)
David King - 65 y.o. male MRN [id number]  Date of birth: 09-08-1956  Office Visit Note: Visit Date: 05/24/2020 PCP: David Cruel, MD Referred by: David Cruel, MD  Subjective: Chief Complaint  Patient presents with   Left Hip - Pain   HPI:  David King is a 64 y.o. male who comes in today for planned Left anesthetic hip arthrogram with fluoroscopic guidance.  The patient has failed conservative care including home exercise, medications, time and activity modification.  This injection will be diagnostic and hopefully therapeutic.  Please see requesting physician notes for further details and justification.  ROS Otherwise per HPI.  Assessment & Plan: Visit Diagnoses:  1. Pain in left hip     Plan: Findings:  Had the patient walk the halls and reports some relief in his lateral hip pain but not dramatic.    Meds & Orders: No orders of the defined types were placed in this encounter.   Orders Placed This Encounter  Procedures   Large Joint Inj: L hip joint   XR C-ARM NO REPORT    Follow-up: Return in about 2 weeks (around 06/07/2020) for David Rosenthal, MD.   Procedures: Large Joint Inj: L hip joint on 05/24/2020 3:48 PM Indications: pain and diagnostic evaluation Details: 22 G needle, anterior approach  Arthrogram: Yes  Medications: 4 mL bupivacaine 0.25 %; 60 mg triamcinolone acetonide 40 MG/ML Outcome: tolerated well, no immediate complications  Arthrogram demonstrated excellent flow of contrast throughout the joint surface without extravasation or obvious defect.  Had the patient walk the halls and reports some relief in his lateral hip pain but not dramatic.  Procedure, treatment alternatives, risks and benefits explained, specific risks discussed. Consent was given by the patient. Immediately prior to procedure a time out was called to verify the correct patient, procedure, equipment, support staff and site/side marked as required. Patient was  prepped and draped in the usual sterile fashion.      No notes on file   Clinical History: CLINICAL DATA:  Left lateral hip pain for 5 years, worsening.  EXAM: MR OF THE LEFT HIP WITHOUT CONTRAST  TECHNIQUE: Multiplanar, multisequence MR imaging was performed. No intravenous contrast was administered.  COMPARISON:  Radiographs of 317/21  FINDINGS: Bones: Lower lumbar spondylosis and degenerative disc disease.  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 No flattening or contour change.  No significant marrow edema is identified.  Articular cartilage and labrum  Articular cartilage: Mild axial thinning of articular cartilage without focal defect identified.  Labrum: Typical recess appearance on image 10/6. There is a suggestion of labral degeneration for example on image 14/7, but without a well-defined labral tear or paralabral cyst.  Joint or bursal effusion  Joint effusion:  Absent  Bursae: No regional bursitis.  Muscles and tendons  Muscles and tendons:  Unremarkable  Other findings  Miscellaneous:   Sigmoid colon diverticulosis.  IMPRESSION: 1. 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 No flattening or contour change. 2. Mild axial thinning of articular cartilage in the left hip joint. 3. Lower lumbar spondylosis and degenerative disc disease. 4. Sigmoid colon diverticulosis.   Electronically Signed   By: Van Clines M.D.   On: 04/24/2020 10:09     Objective:  VS:  HT:     WT:    BMI:      BP:    HR: bpm   TEMP: ( )  RESP:  Physical Exam   Imaging: XR C-ARM NO REPORT  Result Date: 05/24/2020 Please see Notes tab for imaging impression.

## 2020-06-08 ENCOUNTER — Other Ambulatory Visit: Payer: Self-pay

## 2020-06-08 ENCOUNTER — Encounter: Payer: Self-pay | Admitting: Orthopaedic Surgery

## 2020-06-08 ENCOUNTER — Ambulatory Visit (INDEPENDENT_AMBULATORY_CARE_PROVIDER_SITE_OTHER): Payer: PRIVATE HEALTH INSURANCE | Admitting: Orthopaedic Surgery

## 2020-06-08 DIAGNOSIS — M25552 Pain in left hip: Secondary | ICD-10-CM | POA: Diagnosis not present

## 2020-06-08 NOTE — Progress Notes (Signed)
The patient is returning for follow-up after having an intra-articular steroid injection in his left hip under fluoroscopic guidance by Dr. Ernestina Patches.  He says that he is pain-free and has no issues at all.  A MRI of his left hip shows a small nidus of walled off avascular necrosis.  His been having pain for 7 years but a normal exam other than pain.  On exam today he has full range of motion of his left hip and there is no pain to compression of the hip into the socket at all.  There is no pain with rotation.  He states that he can get on his shoes and socks easily and has no issues.  He does have chronic numbness and tingling in his foot from previous spine surgery.  At this point follow-up can be as needed for his hip.  He knows to wait at least 6 months between injections.  If things worsen in any way he will let us know.  All questions and concerns were answered and addressed.

## 2020-07-13 ENCOUNTER — Encounter: Payer: Self-pay | Admitting: Family Medicine

## 2020-07-13 ENCOUNTER — Ambulatory Visit: Payer: PRIVATE HEALTH INSURANCE | Admitting: Family Medicine

## 2020-07-13 VITALS — BP 134/86 | HR 100

## 2020-07-13 DIAGNOSIS — L400 Psoriasis vulgaris: Secondary | ICD-10-CM

## 2020-07-13 NOTE — Progress Notes (Signed)
  Subjective:     Patient ID: David King, male   DOB: [birth year], 64 y.o.   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 He gets these every 3 months. He tolerates well with significant improvement of his psoriasis symptoms. He denies any other problems or concerns today.   Review of Systems  All other systems reviewed and are negative.      Objective:   Physical Exam Vitals reviewed.  Constitutional:      General: He is not in acute distress.    Appearance: Normal appearance. He is well-developed.  HENT:     Head: Normocephalic and atraumatic.  Eyes:     General:        Right eye: No discharge.        Left eye: No discharge.  Cardiovascular:     Rate and Rhythm: Normal rate and regular rhythm.     Heart sounds: Normal heart sounds.  Pulmonary:     Effort: Pulmonary effort is normal. No respiratory distress.     Breath sounds: Normal breath sounds.  Skin:    General: Skin is warm and dry.  Neurological:     Mental Status: He is alert and oriented to person, place, and time.  Psychiatric:        Mood and Affect: Mood normal.        Behavior: Behavior normal.    Today's Vitals   07/13/20 0941  BP: 134/86  Pulse: 100  SpO2: 96%   There is no height or weight on file to calculate BMI.  Injection Note: Skyrizi 75 mg given SQ to bilateral upper arms. Exp: Nov 2022, Lot # [id number]    Assessment:     Plaque psoriasis      Plan:     1. Tolerated injections well. Notify if any problems. Keep all regularly scheduled f/u's with dermatologist and PCP. RTC in 3 months for Skyrizi injection. 2. Recommend colonoscopy. He should contact his PCP for referral.  3. He continues to have hip pain post intra-articular steroid injection about 2 months ago. Recommend he contact his orthopedic specialist for further evaluation and management options.

## 2020-08-06 ENCOUNTER — Other Ambulatory Visit: Payer: Self-pay | Admitting: Pulmonary Disease

## 2020-09-05 ENCOUNTER — Other Ambulatory Visit: Payer: Self-pay

## 2020-09-05 ENCOUNTER — Ambulatory Visit (INDEPENDENT_AMBULATORY_CARE_PROVIDER_SITE_OTHER)
Admission: RE | Admit: 2020-09-05 | Discharge: 2020-09-05 | Disposition: A | Discharge status: Home / Self Care | Payer: PRIVATE HEALTH INSURANCE | Source: Ambulatory Visit | Attending: Acute Care | Admitting: Acute Care

## 2020-09-05 DIAGNOSIS — Z87891 Personal history of nicotine dependence: Secondary | ICD-10-CM | POA: Diagnosis not present

## 2020-09-05 DIAGNOSIS — Z122 Encounter for screening for malignant neoplasm of respiratory organs: Secondary | ICD-10-CM

## 2020-09-07 NOTE — Progress Notes (Signed)
Please call patient and let them  know their  low dose Ct was read as a Lung RADS 2: nodules that are benign in appearance and behavior with a very low likelihood of becoming a clinically active cancer due to size or lack of growth. Recommendation per radiology is for a repeat LDCT in 12 months. .Please let them  know we will order and schedule their  annual screening scan for 08/2021. Please let them  know there was notation of CAD on their  scan.  Please remind the patient  that this is a non-gated exam therefore degree or severity of disease  cannot be determined. Please have them  follow up with their PCP regarding potential risk factor modification, dietary therapy or pharmacologic therapy if clinically indicated. °Pt.  is  not currently on statin therapy. Please place order for annual  screening scan for  08/2021 and fax results to PCP. Thanks so much. °

## 2020-09-08 ENCOUNTER — Other Ambulatory Visit: Payer: Self-pay | Admitting: Pulmonary Disease

## 2020-09-08 ENCOUNTER — Telehealth: Payer: Self-pay | Admitting: Acute Care

## 2020-09-08 DIAGNOSIS — Z87891 Personal history of nicotine dependence: Secondary | ICD-10-CM

## 2020-09-08 NOTE — Telephone Encounter (Signed)
Pt informed of CT results per Sarah Groce, NP.  PT verbalized understanding.  Copy sent to PCP.  Order placed for 1 yr f/u CT.  

## 2020-09-27 ENCOUNTER — Ambulatory Visit (INDEPENDENT_AMBULATORY_CARE_PROVIDER_SITE_OTHER): Payer: PRIVATE HEALTH INSURANCE | Admitting: Orthopaedic Surgery

## 2020-09-27 DIAGNOSIS — M25552 Pain in left hip: Secondary | ICD-10-CM | POA: Diagnosis not present

## 2020-09-27 MED ORDER — LIDOCAINE HCL 1 % IJ SOLN
3.0000 mL | INTRAMUSCULAR | Status: AC | PRN
  Administered 2020-09-27: 3 mL

## 2020-09-27 MED ORDER — METHYLPREDNISOLONE ACETATE 40 MG/ML IJ SUSP
40.0000 mg | INTRAMUSCULAR | Status: AC | PRN
  Administered 2020-09-27: 40 mg via INTRA_ARTICULAR

## 2020-09-27 NOTE — Progress Notes (Signed)
Office Visit Note   Patient: David King           Date of Birth: [birth year]           MRN: [id number] Visit Date: 09/27/2020              Requested by: David King, David King,  David King David King PCP: David Cruel, MD   Assessment & Plan: Visit Diagnoses:  1. Pain in left hip     Plan: Based on his signs and symptoms and clinical exam findings today, I am still uncertain as to whether a hip replacement is warranted for him.  His pain still seems to be over the lateral aspect of the hip so I did offer steroid injection over the maximum point of tenderness at the left trochanteric area and he agreed to this and tolerated it well.  I would like to see him back in just 2 weeks to see how he is doing overall.  We may consider at that point outpatient surgery for surgery over the left trochanteric area with a bursectomy and IT band lengthening.  I still do not feel a hip replacement is appropriate based on his clinical exam but certainly that could be the driving factor in his hip pain to the trochanteric area.  Follow-Up Instructions: Return in about 1 week (around 10/04/2020).   Orders:  Orders Placed This Encounter  Procedures  . Large Joint Inj   No orders of the defined types were placed in this encounter.     Procedures: Large Joint Inj: L greater trochanter on 09/27/2020 4:54 PM Indications: pain and diagnostic evaluation Details: 22 G 1.5 in needle, lateral approach  Arthrogram: No  Medications: 3 mL lidocaine 1 %; 40 mg methylPREDNISolone acetate 40 MG/ML Outcome: tolerated well, no immediate complications Procedure, treatment alternatives, risks and benefits explained, specific risks discussed. Consent was given by the patient. Immediately prior to procedure a time out was called to verify the correct patient, procedure, equipment, support staff and site/side marked as required. Patient was prepped and draped in the usual sterile fashion.        Clinical Data: No additional findings.   Subjective: Chief Complaint  Patient presents with  . Left Hip - Pain  The patient is a 64 still is dealing with debilitating left hip pain that hurts with that he lays on his side.  Also hurts with weightbearing but not so much in the groin as it does over the lateral aspect of his hip.  He had an intra-articular injection under fluoroscopy by Dr. Ernestina King in the left hip and he said that helped him for about a month.  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  He still denies any groin pain and reports significant pain over the lateral aspect of his left hip.  HPI  Review of Systems He currently denies any headache, chest pain, shortness of breath, fever, chills, nausea, vomiting  Objective: Vital Signs: There were no vitals taken for this visit.  Physical Exam He is alert and oriented x3 and in no acute distress Ortho Exam Examination of the left hip still shows that it moves smoothly and fluidly throughout its entire arc of rotation with no pain in the hip joint itself.  There is pain  to palpation over the tip of the greater trochanter and proximal IT band and trochanteric area. Specialty Comments:  No specialty comments available.  Imaging: No results found.   PMFS History: Patient Active Problem List   Diagnosis Date Noted  . Essential hypertension 01/27/2020  . 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  . Former smoker 08/05/2019  . Pulmonary emphysema ([place]) 08/05/2019  . Prostate cancer [facility]    Past Medical History:  Diagnosis Date  . Anxiety    new dx  . Arthritis    lumbar  . Diverticulosis   . Elevated PSA   . Headache(784.0)    MIGRAINES  . Iritis    CHRONIC IN LEFT EYE - SOME VISIAL IMPAIRMENT IN LEFT EYE  . Neuromuscular disorder ([place])    left  leg/foot,pinched siactic nerve  . Pain    PAIN LEFT HIP AND DOWN LT LEG WITH NUMBNESS IN LEFT LEG--PT STATES SCIATIC NERVE IMPINGEMENT - PT PLANS BACK IN THE NEAR SURGERY.  . Prostate cancer ([place]) 03/05/13   Adenocarcinoma  . Renal cysts, acquired, bilateral 03/19/13   several simple , CT  . Urinary frequency    AND NOCTURIA    Family History  Problem Relation Age of Onset  . Cancer Father        prostate cancer  . Cancer Paternal Grandfather        prostate    Past Surgical History:  Procedure Laterality Date  . BACK SURGERY    . CATARACT EXTRACTION W/ INTRAOCULAR LENS IMPLANT Left   . EYE SURGERY     RETINAL SURGERY LEFT EYE  . HYDROCELE EXCISION Left 03/05/2013   Procedure: HYDROCELECTOMY ADULT;  Surgeon: David Bonine, MD;  Location: [facility];  Service: Urology;  Laterality: Left;  . INGUINAL HERNIA REPAIR Bilateral AGE 68  . LYMPHADENECTOMY Bilateral 05/06/2013   Procedure: LYMPHADENECTOMY;  Surgeon: David Gray, MD;  Location: [facility];  Service: Urology;  Laterality: Bilateral;  . PROSTATE BIOPSY N/A 03/05/2013   Procedure: PROSTATE BIOPSY and ultrasound;  Surgeon: David Bonine, MD;  Location: [facility];  Service: Urology;  Laterality: N/A;  . REMOVAL BURSA [place], LEFT ELBOW  1996  . ROBOT ASSISTED LAPAROSCOPIC RADICAL PROSTATECTOMY N/A 05/06/2013   Procedure: ROBOTIC ASSISTED LAPAROSCOPIC RADICAL PROSTATECTOMY LEVEL 3;  Surgeon: David Gray, MD;  Location: [facility];  Service: Urology;  Laterality: N/A;  . TONSILLECTOMY     as achild   Social History   Occupational History  . Not on file  Tobacco Use  . Smoking status: Former Smoker    Packs/day: 2.00    Years: 47.00    Pack years: 94.00    Types: Cigarettes    Quit date: 12/31/2016    Years since quitting: 3.7  . Smokeless tobacco: Never Used  . Tobacco comment: Patient is currently smoke free since January 2018  Vaping Use  . Vaping Use: Never used  Substance and Sexual  Activity  . Alcohol use: Yes    Alcohol/week: 8.0 standard drinks    Types: 8 Standard drinks or equivalent per week    Comment: 3 DRINKS A DAY  . Drug use: No  . Sexual activity: Never

## 2020-10-04 ENCOUNTER — Telehealth: Payer: Self-pay | Admitting: Pulmonary Disease

## 2020-10-04 ENCOUNTER — Other Ambulatory Visit: Payer: Self-pay | Admitting: Pulmonary Disease

## 2020-10-04 MED ORDER — TRELEGY ELLIPTA 100-62.5-25 MCG/INH IN AEPB: INHALATION_SPRAY | RESPIRATORY_TRACT | 0 refills | Status: DC

## 2020-10-04 NOTE — Telephone Encounter (Signed)
Spoke with pt to notify that Trelegy would be refilled once since OV is tomorrow. Encouraged pt to keep OV tomorrow and to ask for additional refills at that point. Nothing further needed at this time.

## 2020-10-05 ENCOUNTER — Encounter: Payer: Self-pay | Admitting: Pulmonary Disease

## 2020-10-05 ENCOUNTER — Ambulatory Visit (INDEPENDENT_AMBULATORY_CARE_PROVIDER_SITE_OTHER): Payer: PRIVATE HEALTH INSURANCE | Admitting: Pulmonary Disease

## 2020-10-05 ENCOUNTER — Other Ambulatory Visit: Payer: Self-pay

## 2020-10-05 VITALS — BP 112/80 | HR 94 | Temp 97.6°F | Ht 69.09 in | Wt 218.4 lb

## 2020-10-05 DIAGNOSIS — J449 Chronic obstructive pulmonary disease, unspecified: Secondary | ICD-10-CM

## 2020-10-05 NOTE — Patient Instructions (Signed)
I am glad you are doing well with regard to your breathing Continue Trelegy inhaler Follow-up in 1 year.

## 2020-10-05 NOTE — Progress Notes (Signed)
DECKLIN WEDDINGTON    [id number]    1956/07/31  Primary Care Physician:[name], Dwyane Luo, MD  Referring Physician: [name], MD [place],  [place] [address]  Chief complaint: Follow up for COPD, lung nodules  HPI: 64 year old former smoker with history of psoriasis, prostate cancer, arthritis, anxiety, migraines, diverticulosis. He has complaints of progressive dyspnea on exertion for the past 10 months.  He is symptomatic with rest and as well.  Denies any cough, sputum production, wheezing. He was seen in the ED on 11/03/17 with an x-ray that was negative for any acute abnormality.  He did not have any interventions done.  Evaluated by cardiology at [facility] for unstable angina.  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  Quit in January 2018 Travel History: not significant  Interim history: Continues on Trelegy inhaler.  States that breathing is doing well with no issues  Outpatient Encounter Medications as of 10/05/2020  Medication Sig  . albuterol (VENTOLIN HFA) 108 (90 Base) MCG/ACT inhaler USE 1-2 PUFFS EVERY 6 HOURS AS NEEDED FOR WHEEZING OR SHORTNESS OF BREATH.  . clobetasol cream (TEMOVATE) 0.05 %   . Fluticasone-Umeclidin-Vilant (TRELEGY ELLIPTA) 100-62.5-25 MCG/INH AEPB INHALE 1 PUFF INTO THE LUNGS DAILY.  [place] Kitchen lisinopril (ZESTRIL) 20 MG tablet Take 20 mg by mouth daily.  [place] Kitchen omeprazole (PRILOSEC) 20 MG capsule Take by mouth.  . SKYRIZI, 150 MG DOSE, 75 MG/0.83ML PSKT    No facility-administered encounter medications on file as of 10/05/2020.   Physical Exam: Blood pressure 112/80, pulse 94, temperature 97.6 F (36.4 C), temperature source Temporal, height 5' 9.09" (1.755 m), weight 218 lb 6.4 oz  (99.1 kg), SpO2 97 %. Gen:      No acute distress HEENT:  EOMI, sclera anicteric Neck:     No masses; no thyromegaly Lungs:    Clear to auscultation bilaterally; normal respiratory effort CV:         Regular rate and rhythm; no murmurs Abd:      + bowel sounds; soft, non-tender; no palpable masses, no distension Ext:    No edema; adequate peripheral perfusion Skin:      Warm and dry; no rash Neuro: alert and oriented x 3 Psych: normal mood and affect  Data Reviewed: Imaging CT abdomen 03/19/13-visualized lung bases are clear. Screening CT chest 11/14/17-mild emphysema with lower lobe bronchiectasis.  3.5 mm nodule in left upper lobe. Screening CT chest 11/18/2018- new irregular 7 mm nodule in the left upper lobe.  3.5 mm nodule is stable Screening CT chest 02/19/2019- 7 mm nodule previously identified has most completely resolved.  There is a new left upper lobe nodule measuring 5.7 mm. Screening CT chest 08/26/2019- near resolution of left upper lobe nodule.  Additional pulmonary nodules are stable Screening CT chest 09/05/2020-emphysema, stable pulmonary nodule.  Bronchiectasis with bronchial wall thickening.  PFTs 12/11/17 FVC 3.93 (87%], FEV1 2.67 [79%], F/F 68, TLC 113%, RV/TLC 147%, DLCO 78% Moderate obstruction with minimal diffusion defect.  Positive bronchodilator effect. Air trapping.  Labs CBC 06/18/2018-WBC 8.4, eos 0.9%, absolute eosinophil count 76 Alpha-1 antitrypsin 06/26/2018-157, PI MM Blood allergy profile 06/26/2018-IgE 130, RAST panel negative  Assessment:  COPD PFTs reviewed which showed moderate- obstruction with bronchodilator response.  Continues on Trelegy inhaler.  Not clear to me if he needs inhaled steroids as peripheral eosinophils are low however he feels this works better for him than the Anoro.   GERD Continue on PPI  Pulmonary nodules Follow-up CT in 1 year.  Health maintenance Does not want influenza vaccine 02/23/2019-Pneumovax  Up-to-date with  COVID-19 oxygenation  Plan/Recommendations: - Continue Trelegy - Continue annual low-dose screening CT of the chest.  [name] MD [facility] 10/05/2020, 10:53 AM  CC: [name], MD

## 2020-10-10 ENCOUNTER — Other Ambulatory Visit: Payer: Self-pay | Admitting: Family Medicine

## 2020-10-11 ENCOUNTER — Encounter: Payer: Self-pay | Admitting: Orthopaedic Surgery

## 2020-10-11 ENCOUNTER — Ambulatory Visit (INDEPENDENT_AMBULATORY_CARE_PROVIDER_SITE_OTHER): Payer: PRIVATE HEALTH INSURANCE | Admitting: Orthopaedic Surgery

## 2020-10-11 DIAGNOSIS — M7062 Trochanteric bursitis, left hip: Secondary | ICD-10-CM | POA: Diagnosis not present

## 2020-10-11 LAB — CBC WITH DIFFERENTIAL/PLATELET
Absolute Monocytes: 911 cells/uL (ref 200–950)
Basophils Absolute: 83 cells/uL (ref 0–200)
Basophils Relative: 0.9 %
Eosinophils Absolute: 120 cells/uL (ref 15–500)
Eosinophils Relative: 1.3 %
HCT: 45.8 % (ref 38.5–50.0)
Hemoglobin: 15.4 g/dL (ref 13.2–17.1)
Lymphs Abs: 2769 cells/uL (ref 850–3900)
MCH: 33.7 pg — ABNORMAL HIGH (ref 27.0–33.0)
MCHC: 33.6 g/dL (ref 32.0–36.0)
MCV: 100.2 fL — ABNORMAL HIGH (ref 80.0–100.0)
MPV: 10.6 fL (ref 7.5–12.5)
Monocytes Relative: 9.9 %
Neutro Abs: 5318 cells/uL (ref 1500–7800)
Neutrophils Relative %: 57.8 %
Platelets: 228 10*3/uL (ref 140–400)
RBC: 4.57 10*6/uL (ref 4.20–5.80)
RDW: 12.3 % (ref 11.0–15.0)
Total Lymphocyte: 30.1 %
WBC: 9.2 10*3/uL (ref 3.8–10.8)

## 2020-10-11 LAB — COMPLETE METABOLIC PANEL WITH GFR
AG Ratio: 1.2 (calc) (ref 1.0–2.5)
ALT: 74 U/L — ABNORMAL HIGH (ref 9–46)
AST: 33 U/L (ref 10–35)
Albumin: 4.1 g/dL (ref 3.6–5.1)
Alkaline phosphatase (APISO): 59 U/L (ref 35–144)
BUN: 23 mg/dL (ref 7–25)
CO2: 21 mmol/L (ref 20–32)
Calcium: 9.7 mg/dL (ref 8.6–10.3)
Chloride: 101 mmol/L (ref 98–110)
Creat: 1.06 mg/dL (ref 0.70–1.25)
GFR, Est African American: 84 mL/min/{1.73_m2} (ref >=60)
GFR, Est Non African American: 73 mL/min/{1.73_m2} (ref >=60)
Globulin: 3.3 g/dL (calc) (ref 1.9–3.7)
Glucose, Bld: 96 mg/dL (ref 65–139)
Potassium: 4.5 mmol/L (ref 3.5–5.3)
Sodium: 134 mmol/L — ABNORMAL LOW (ref 135–146)
Total Bilirubin: 0.6 mg/dL (ref 0.2–1.2)
Total Protein: 7.4 g/dL (ref 6.1–8.1)

## 2020-10-11 LAB — LIPID PANEL
Cholesterol: 218 mg/dL — ABNORMAL HIGH (ref <200)
HDL: 46 mg/dL (ref >=40)
LDL Cholesterol (Calc): 143 mg/dL (calc) — ABNORMAL HIGH
Non-HDL Cholesterol (Calc): 172 mg/dL (calc) — ABNORMAL HIGH (ref <130)
Total CHOL/HDL Ratio: 4.7 (calc) (ref <5.0)
Triglycerides: 157 mg/dL — ABNORMAL HIGH (ref <150)

## 2020-10-11 NOTE — Progress Notes (Signed)
The patient is a 64 year old gentleman who comes in for continued follow-up for chronic left hip trochanteric bursitis.  This is been going on for over 5 years.  I did provide injection just 2 weeks ago in the left hip area and he said it was great for about a week.  He still having some pain but it has improved.  He is still concerned about the chronic nature of the pain in this area and at this point wants to consider surgical intervention.  He has had multiple injections at this point.  His exam is consistent with trochanteric bursitis of the left hip.  There is pain over the tip of the trochanteric area and the proximal IT band.  MRI of his left hip from earlier this year did not show any acute findings of the trochanteric area.  I gave him reassurance that this still is a good sign that we can perform a surgical intervention and hopefully get some improvement of his pain at this standpoint.  I described what the surgery involves in terms of a bursectomy over the left hip trochanteric area and IT band lengthening.  We did debride any inflamed tissue as well.  I would then have him not lay on that hip for about 4 to 6 weeks.  I let him get back to work within the next week if he would like to.  I described in detail what the surgery involves as well as the risk and benefits.  This can be done as an outpatient.  He would like to have this scheduled.  We will be in touch.

## 2020-10-20 ENCOUNTER — Telehealth: Payer: Self-pay | Admitting: Acute Care

## 2020-10-23 NOTE — Telephone Encounter (Signed)
Spoke with pt regarding bills he received for his yearly low dose screening chest ct. I checked with [facility] who handles some of our billing and coding and she advised to have pt call numbers on both bills and make sure they used the screening code of [phone].  Pt verbalized understanding and will call back with any further questions.

## 2020-11-02 ENCOUNTER — Other Ambulatory Visit: Payer: Self-pay | Admitting: Orthopaedic Surgery

## 2020-11-02 DIAGNOSIS — M7062 Trochanteric bursitis, left hip: Secondary | ICD-10-CM

## 2020-11-02 MED ORDER — HYDROCODONE-ACETAMINOPHEN 5-325 MG PO TABS: 1.0000 | ORAL_TABLET | Freq: Four times a day (QID) | ORAL | 0 refills | Status: DC | PRN

## 2020-11-04 ENCOUNTER — Other Ambulatory Visit: Payer: Self-pay | Admitting: Pulmonary Disease

## 2020-11-10 ENCOUNTER — Telehealth: Payer: Self-pay | Admitting: Pulmonary Disease

## 2020-11-10 MED ORDER — TRELEGY ELLIPTA 100-62.5-25 MCG/INH IN AEPB: INHALATION_SPRAY | RESPIRATORY_TRACT | 11 refills | Status: DC

## 2020-11-10 NOTE — Telephone Encounter (Signed)
Spoke to patient, who is requesting Rx for Trelegy 100. Patient stated that per [place], this medication was denied.  Patient was last seen 10/05/2020 and was instructed to continue this medication and f/u in 1y.  rx has been sent to preferred pharmacy.  Nothing further is needed.

## 2020-11-16 ENCOUNTER — Encounter: Payer: Self-pay | Admitting: Orthopaedic Surgery

## 2020-11-16 ENCOUNTER — Encounter: Payer: Self-pay | Admitting: Family Medicine

## 2020-11-16 ENCOUNTER — Ambulatory Visit: Payer: PRIVATE HEALTH INSURANCE | Admitting: Family Medicine

## 2020-11-16 ENCOUNTER — Ambulatory Visit (INDEPENDENT_AMBULATORY_CARE_PROVIDER_SITE_OTHER): Payer: PRIVATE HEALTH INSURANCE | Admitting: Orthopaedic Surgery

## 2020-11-16 VITALS — BP 130/76

## 2020-11-16 DIAGNOSIS — L400 Psoriasis vulgaris: Secondary | ICD-10-CM

## 2020-11-16 DIAGNOSIS — M7062 Trochanteric bursitis, left hip: Secondary | ICD-10-CM

## 2020-11-16 NOTE — Progress Notes (Signed)
The patient is 2 weeks status post a left hip trochanteric bursectomy with lengthening of the IT band.  He reports that he is feeling better since surgery and has a little bit of soreness.  He would like to return to work Monday which does not involve any high impact activities.  He is walking without a limp or an assistive device.  Examination of his left hip incision shows the incision is healing nicely with no complicating features.  I want him to still not lay on that hip at night and to avoid high impact aerobic activities.  All questions and concerns were answered addressed.  He can follow-up as needed for that hip.  If he has other issues he knows to let us know.

## 2020-11-16 NOTE — Progress Notes (Signed)
  Subjective:     Patient ID: David King, male   DOB: 18-Feb-1956, 64 y.o.   MRN: [id number]  HPI David King presents to the [facility] health and wellness clinic today for his Skyrizi injection for treatment of his plaque psoriasis. He has tolerated Skyrizi well in the past and is effective for him. He has had recent f/u with his dermatologist. He denies any s/s infection or sick contacts. He has been fully immunized against covid-19 and recently had his covid-19 booster.   Review of Systems  Constitutional: Negative.   HENT: Negative.   Respiratory: Negative.   Cardiovascular: Negative.   Gastrointestinal: Negative.   Neurological: Negative.        Objective:   Physical Exam Vitals reviewed.  Constitutional:      General: He is not in acute distress.    Appearance: Normal appearance. He is well-developed.  HENT:     Head: Normocephalic and atraumatic.  Eyes:     General:        Right eye: No discharge.        Left eye: No discharge.  Cardiovascular:     Rate and Rhythm: Normal rate and regular rhythm.     Heart sounds: Normal heart sounds.  Pulmonary:     Effort: Pulmonary effort is normal. No respiratory distress.     Breath sounds: Normal breath sounds.  Skin:    General: Skin is warm and dry.  Neurological:     Mental Status: He is alert and oriented to person, place, and time.  Psychiatric:        Mood and Affect: Mood normal.        Behavior: Behavior normal.    Injection note: Skyrizi 150 mg administered SQ to RLQ abdomen. Exp: Jan 2023, Lot # [id number]     Assessment:     Plaque psoriasis      Plan:     1. Tolerated injection well. Notify if any problems. Keep all regularly scheduled f/u appts with PCP. RTC in 3 months for Skyrizi injection. 2. Discussed recent lab results and need to start cholesterol lowering medication given ASCVD risk. He states he will discuss this with his PCP.

## 2021-01-29 ENCOUNTER — Ambulatory Visit (INDEPENDENT_AMBULATORY_CARE_PROVIDER_SITE_OTHER): Payer: PRIVATE HEALTH INSURANCE | Admitting: Orthopaedic Surgery

## 2021-01-29 DIAGNOSIS — M25552 Pain in left hip: Secondary | ICD-10-CM

## 2021-01-29 DIAGNOSIS — M7062 Trochanteric bursitis, left hip: Secondary | ICD-10-CM

## 2021-01-29 MED ORDER — METHYLPREDNISOLONE 4 MG PO TABS: ORAL_TABLET | ORAL | 0 refills | Status: DC

## 2021-01-29 MED ORDER — METHYLPREDNISOLONE ACETATE 40 MG/ML IJ SUSP
40.0000 mg | INTRAMUSCULAR | Status: AC | PRN
  Administered 2021-01-29: 40 mg via INTRA_ARTICULAR

## 2021-01-29 MED ORDER — LIDOCAINE HCL 1 % IJ SOLN
3.0000 mL | INTRAMUSCULAR | Status: AC | PRN
  Administered 2021-01-29: 3 mL

## 2021-01-29 NOTE — Progress Notes (Signed)
Office Visit Note   Patient: David King           Date of Birth: 17-Oct-1956           MRN: [id number] Visit Date: 01/29/2021              Requested by: [name], [place],  [place] [id number] PCP: [name], MD   Assessment & Plan: Visit Diagnoses:  1. Trochanteric bursitis, left hip   2. Pain in left hip     Plan: I do understand his frustration with this hip still hurting.  I offered him a steroid injection over the trochanteric area where he is tender I will put him on a 6-day steroid taper.  I have also recommended outpatient therapy and gave him a prescription to try this.  I would like to see him back in 6 weeks to see how he is done with this treatment and therapy.  Follow-Up Instructions: Return in about 6 weeks (around 03/12/2021).   Orders:  Orders Placed This Encounter  Procedures  . Large Joint Inj   Meds ordered this encounter  Medications  . methylPREDNISolone (MEDROL) 4 MG tablet    Sig: Medrol dose pack. Take as instructed    Dispense:  21 tablet    Refill:  0      Procedures: Large Joint Inj: L greater trochanter on 01/29/2021 3:36 PM Indications: pain and diagnostic evaluation Details: 22 G 1.5 in needle, lateral approach  Arthrogram: No  Medications: 3 mL lidocaine 1 %; 40 mg methylPREDNISolone acetate 40 MG/ML Outcome: tolerated well, no immediate complications Procedure, treatment alternatives, risks and benefits explained, specific risks discussed. Consent was given by the patient. Immediately prior to procedure a time out was called to verify the correct patient, procedure, equipment, support staff and site/side marked as required. Patient was prepped and draped in the usual sterile fashion.       Clinical Data: No additional findings.   Subjective: Chief Complaint  Patient presents with  . Left Hip - Pain  The patient still dealing with significant left hip pain.  He has a history of a left hip  trochanteric bursectomy and IT band lengthening almost 3 months ago.  He still has a lot of pain the lateral aspect of the left hip.  He is 65 years old.  He has been dealing with this for 10 years now he states.  He still denies any groin pain at all.  HPI  Review of Systems He currently has a headache, chest pain, shortness of breath, fever, chills, nausea, vomiting  Objective: Vital Signs: There were no vitals taken for this visit.  Physical Exam He is alert and oriented x3 and in no acute distress Ortho Exam Examination of his left hip shows it moves smoothly and fluidly with no pain in the groin at all.  He has a lot of pain to palpation over the lateral trochanteric area.  His incision on that side is healed nicely.  There is no redness or swelling. Specialty Comments:  No specialty comments available.  Imaging: No results found.   PMFS History: Patient Active Problem List   Diagnosis Date Noted  . Essential hypertension 01/27/2020  . 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  . Former smoker 08/05/2019  . Pulmonary emphysema ([place]) 08/05/2019  . Prostate cancer [facility])    Past Medical History:  Diagnosis Date  . Anxiety    new dx  . Arthritis  lumbar  . Diverticulosis   . Elevated PSA   . Headache(784.0)    MIGRAINES  . Iritis    CHRONIC IN LEFT EYE - SOME VISIAL IMPAIRMENT IN LEFT EYE  . Neuromuscular disorder ([place])    left leg/foot,pinched siactic nerve  . Pain    PAIN LEFT HIP AND DOWN LT LEG WITH NUMBNESS IN LEFT LEG--PT STATES SCIATIC NERVE IMPINGEMENT - PT PLANS BACK IN THE NEAR SURGERY.  . Prostate cancer ([place]) 03/05/13   Adenocarcinoma  . Renal cysts, acquired, bilateral 03/19/13   several simple , CT  . Urinary frequency    AND NOCTURIA    Family History  Problem Relation Age of Onset  . Cancer Father        prostate cancer  . Cancer Paternal Grandfather        prostate    Past Surgical History:  Procedure Laterality Date  . BACK SURGERY    . CATARACT  EXTRACTION W/ INTRAOCULAR LENS IMPLANT Left   . EYE SURGERY     RETINAL SURGERY LEFT EYE  . HYDROCELE EXCISION Left 03/05/2013   Procedure: HYDROCELECTOMY ADULT;  Surgeon: Fredricka Bonine, MD;  Location: [facility];  Service: Urology;  Laterality: Left;  . INGUINAL HERNIA REPAIR Bilateral AGE 39  . LYMPHADENECTOMY Bilateral 05/06/2013   Procedure: LYMPHADENECTOMY;  Surgeon: Dutch Gray, MD;  Location: [facility];  Service: Urology;  Laterality: Bilateral;  . PROSTATE BIOPSY N/A 03/05/2013   Procedure: PROSTATE BIOPSY and ultrasound;  Surgeon: Fredricka Bonine, MD;  Location: [facility];  Service: Urology;  Laterality: N/A;  . REMOVAL BURSA [place], LEFT ELBOW  1996  . ROBOT ASSISTED LAPAROSCOPIC RADICAL PROSTATECTOMY N/A 05/06/2013   Procedure: ROBOTIC ASSISTED LAPAROSCOPIC RADICAL PROSTATECTOMY LEVEL 3;  Surgeon: Dutch Gray, MD;  Location[facility];  Service: Urology;  Laterality: N/A;  . TONSILLECTOMY     as achild   Social History   Occupational History  . Not on file  Tobacco Use  . Smoking status: Former Smoker    Packs/day: 2.00    Years: 47.00    Pack years: 94.00    Types: Cigarettes    Quit date: 12/31/2016    Years since quitting: 4.0  . Smokeless tobacco: Never Used  . Tobacco comment: Patient is currently smoke free since January 2018  Vaping Use  . Vaping Use: Never used  Substance and Sexual Activity  . Alcohol use: Yes    Alcohol/week: 8.0 standard drinks    Types: 8 Standard drinks or equivalent per week    Comment: 3 DRINKS A DAY  . Drug use: No  . Sexual activity: Never

## 2021-02-06 ENCOUNTER — Ambulatory Visit: Payer: PRIVATE HEALTH INSURANCE | Admitting: Family Medicine

## 2021-02-06 ENCOUNTER — Encounter: Payer: Self-pay | Admitting: Family Medicine

## 2021-02-06 VITALS — BP 120/80 | HR 100

## 2021-02-06 DIAGNOSIS — L400 Psoriasis vulgaris: Secondary | ICD-10-CM

## 2021-02-06 NOTE — Progress Notes (Signed)
Subjective:     Patient ID: David King, male   DOB: 1956/02/25, 65 y.o.   MRN: [id number]  HPI  Abrahim presents [facility] the [facility] today for his Skyrizi injection for treatment of his plaque psoriasis. He has tolerated Skyrizi well in the past and is effective for him. He denies any s/s infection or sick contacts. He has been fully immunized against covid-19 and had his covid-19 booster.   Review of Systems  Constitutional: Negative.   HENT: Negative.   Respiratory: Negative.   Cardiovascular: Negative.   Gastrointestinal: Negative.   Neurological: Negative.    Past Medical History:  Diagnosis Date  . Anxiety    new dx  . Arthritis    lumbar  . Diverticulosis   . Elevated PSA   . Headache(784.0)    MIGRAINES  . Iritis    CHRONIC IN LEFT EYE - SOME VISIAL IMPAIRMENT IN LEFT EYE  . Neuromuscular disorder ([place])    left leg/foot,pinched siactic nerve  . Pain    PAIN LEFT HIP AND DOWN LT LEG WITH NUMBNESS IN LEFT LEG--PT STATES SCIATIC NERVE IMPINGEMENT - PT PLANS BACK IN THE NEAR SURGERY.  . Prostate cancer ([place]) 03/05/13   Adenocarcinoma  . Renal cysts, acquired, bilateral 03/19/13   several simple , CT  . Urinary frequency    AND NOCTURIA   No Known Allergies  Current Outpatient Medications:  .  albuterol (VENTOLIN HFA) 108 (90 Base) MCG/ACT inhaler, USE 1-2 PUFFS EVERY 6 HOURS AS NEEDED FOR WHEEZING OR SHORTNESS OF BREATH., Disp: 8.5 g, Rfl: 2 .  clobetasol cream (TEMOVATE) 0.05 %, , Disp: , Rfl:  .  Fluticasone-Umeclidin-Vilant (TRELEGY ELLIPTA) 100-62.5-25 MCG/INH AEPB, INHALE 1 PUFF INTO THE LUNGS DAILY., Disp: 60 each, Rfl: 11 .  HYDROcodone-acetaminophen (NORCO/VICODIN) 5-325 MG tablet, Take 1 tablet by mouth every 6 (six) hours as needed for moderate pain., Disp: 30 tablet, Rfl: 0 .  lisinopril (ZESTRIL) 20 MG tablet, Take 20 mg by mouth daily., Disp: , Rfl:  .  methylPREDNISolone (MEDROL) 4 MG tablet, Medrol dose pack. Take as instructed,  Disp: 21 tablet, Rfl: 0 .  omeprazole (PRILOSEC) 20 MG capsule, Take by mouth., Disp: , Rfl:  .  SKYRIZI, 150 MG DOSE, 75 MG/0.83ML PSKT, , Disp: , Rfl: 0      Objective:   Physical Exam Vitals reviewed.  Constitutional:      General: He is not in acute distress.    Appearance: Normal appearance. He is well-developed.  HENT:     Head: Normocephalic and atraumatic.  Eyes:     General:        Right eye: No discharge.        Left eye: No discharge.  Cardiovascular:     Rate and Rhythm: Normal rate and regular rhythm.     Heart sounds: Normal heart sounds.  Pulmonary:     Effort: Pulmonary effort is normal. No respiratory distress.     Breath sounds: Normal breath sounds.  Skin:    General: Skin is warm and dry.  Neurological:     Mental Status: He is alert and oriented to person, place, and time.  Psychiatric:        Mood and Affect: Mood normal.        Behavior: Behavior normal.    Today's Vitals   02/06/21 1558  BP: 120/80  Pulse: 100  SpO2: 95%   There is no height or weight on file to calculate BMI.  Injection note: Skyrizi 150 mg administered SQ to LLQ abdomen. Exp: Feb 2023, Lot # [id number]     Assessment:     Plaque psoriasis     Plan:     1. Tolerated injection well. Notify if any problems. Keep all regularly scheduled f/u appts with PCP. RTC in 3 months for Skyrizi injection.

## 2021-03-12 ENCOUNTER — Ambulatory Visit (INDEPENDENT_AMBULATORY_CARE_PROVIDER_SITE_OTHER): Payer: PRIVATE HEALTH INSURANCE | Admitting: Physician Assistant

## 2021-03-12 ENCOUNTER — Encounter: Payer: Self-pay | Admitting: Physician Assistant

## 2021-03-12 ENCOUNTER — Other Ambulatory Visit: Payer: Self-pay

## 2021-03-12 DIAGNOSIS — M7062 Trochanteric bursitis, left hip: Secondary | ICD-10-CM

## 2021-03-12 NOTE — Progress Notes (Signed)
HPI: Mr. Claggett returns today follow-up of his left hip pain.  He is now 4-1/2 months status post left hip trochanteric bursectomy and IT band lengthening.  He states the hip overall is doing well.  He feels the injection on 01/29/2021 helped with his pain he also feels therapy is helped.  He still having some pain down his leg numbness in his foot.  However he would like to work on core strengthening and exercise to see if this helps.  Again he had prior surgery on his back feels numbness and tingling is related to his back.  He denies any groin pain.  Review of systems: Please see HPI otherwise negative  Physical exam: Bilateral hips good range of motion of both hips without pain.  Has tenderness over the left trochanteric region with palpation.  Impression left hip trochanteric bursitis   Plan: Due to the fact that his hip pain is improving having continue exercises taught by therapy.  I do feel the need beneficial for him to work on core strengthening and overall stretching in regards to the pain that he has in his leg.  At some point time we may recommend repeat MRI of his lumbar spine is been sometime to see if there is anything else that can be done in regards to the radicular symptoms he is having.  We will see him back in 3 months to see how he is doing overall.  Questions encouraged and answered.

## 2021-06-14 ENCOUNTER — Ambulatory Visit: Payer: PRIVATE HEALTH INSURANCE | Admitting: Physician Assistant

## 2021-06-15 DIAGNOSIS — I700 Atherosclerosis of aorta: Secondary | ICD-10-CM | POA: Insufficient documentation

## 2021-06-15 DIAGNOSIS — K219 Gastro-esophageal reflux disease without esophagitis: Secondary | ICD-10-CM | POA: Insufficient documentation

## 2021-06-15 DIAGNOSIS — Z8546 Personal history of malignant neoplasm of prostate: Secondary | ICD-10-CM | POA: Insufficient documentation

## 2021-06-15 DIAGNOSIS — G4700 Insomnia, unspecified: Secondary | ICD-10-CM | POA: Insufficient documentation

## 2021-06-15 DIAGNOSIS — E559 Vitamin D deficiency, unspecified: Secondary | ICD-10-CM | POA: Insufficient documentation

## 2021-06-27 DIAGNOSIS — M545 Low back pain, unspecified: Secondary | ICD-10-CM | POA: Insufficient documentation

## 2021-07-26 ENCOUNTER — Other Ambulatory Visit: Payer: Self-pay

## 2021-07-26 ENCOUNTER — Ambulatory Visit: Payer: Self-pay | Admitting: Adult Health

## 2021-07-26 NOTE — Progress Notes (Signed)
Established Patient Office Visit  Subjective:  Patient ID: David King, male    DOB: 1956-10-10  Age: 65 y.o. MRN: [id number]  CC: Needs injection of skyrizi.     HPI OSIE ABONCE presents for injection of skyrizi.  He reports he has been taking the medication for several years, and he does one injection every 3 months.  He does not do well giving it to himself so he would like me to administer it today.   Past Medical History:  Diagnosis Date   Anxiety    new dx   Arthritis    lumbar   Diverticulosis    Elevated PSA    Headache(784.0)    MIGRAINES   Iritis    CHRONIC IN LEFT EYE - SOME VISIAL IMPAIRMENT IN LEFT EYE   Neuromuscular disorder ([place])    left leg/foot,pinched siactic nerve   Pain    PAIN LEFT HIP AND DOWN LT LEG WITH NUMBNESS IN LEFT LEG--PT STATES SCIATIC NERVE IMPINGEMENT - PT PLANS BACK IN THE NEAR SURGERY.   Prostate cancer ([place]) 03/05/13   Adenocarcinoma   Renal cysts, acquired, bilateral 03/19/13   several simple , CT   Urinary frequency    AND NOCTURIA    Past Surgical History:  Procedure Laterality Date   BACK SURGERY     CATARACT EXTRACTION W/ INTRAOCULAR LENS IMPLANT Left    EYE SURGERY     RETINAL SURGERY LEFT EYE   HYDROCELE EXCISION Left 03/05/2013   Procedure: HYDROCELECTOMY ADULT;  Surgeon: Fredricka Bonine, MD;  [facility];  Service: Urology;  Laterality: Left;   INGUINAL HERNIA REPAIR Bilateral AGE 72   LYMPHADENECTOMY Bilateral 05/06/2013   Procedure: LYMPHADENECTOMY;  Surgeon: Dutch Gray, MD;  Location: [facility];  Service: Urology;  Laterality: Bilateral;   PROSTATE BIOPSY N/A 03/05/2013   Procedure: PROSTATE BIOPSY and ultrasound;  Surgeon: Fredricka Bonine, MD;  Location: [facility];  Service: Urology;  Laterality: N/A;   REMOVAL BURSA [place], LEFT ELBOW  1996   ROBOT ASSISTED LAPAROSCOPIC RADICAL PROSTATECTOMY N/A 05/06/2013   Procedure: ROBOTIC ASSISTED LAPAROSCOPIC RADICAL PROSTATECTOMY  LEVEL 3;  Surgeon: Dutch Gray, MD;  Location: [facility];  Service: Urology;  Laterality: N/A;   TONSILLECTOMY     as achild    Family History  Problem Relation Age of Onset   Cancer Father        prostate cancer   Cancer Paternal Grandfather        prostate    Social History   Socioeconomic History   Marital status: Married    Spouse name: Not on file   Number of children: Not on file   Years of education: Not on file   Highest education level: Not on file  Occupational History   Not on file  Tobacco Use   Smoking status: Former    Packs/day: 2.00    Years: 47.00    Pack years: 94.00    Types: Cigarettes    Quit date: 12/31/2016    Years since quitting: 4.5   Smokeless tobacco: Never   Tobacco comments:    Patient is currently smoke free since January 2018  Vaping Use   Vaping Use: Never used  Substance and Sexual Activity   Alcohol use: Yes    Alcohol/week: 8.0 standard drinks    Types: 8 Standard drinks or equivalent per week    Comment: 3 DRINKS A DAY   Drug use: No  Sexual activity: Never  Other Topics Concern   Not on file  Social History Narrative   Not on file   Social Determinants of Health   Financial Resource Strain: Not on file  Food Insecurity: Not on file  Transportation Needs: Not on file  Physical Activity: Not on file  Stress: Not on file  Social Connections: Not on file  Intimate Partner Violence: Not on file    Outpatient Medications Prior to Visit  Medication Sig Dispense Refill   albuterol (VENTOLIN HFA) 108 (90 Base) MCG/ACT inhaler USE 1-2 PUFFS EVERY 6 HOURS AS NEEDED FOR WHEEZING OR SHORTNESS OF BREATH. 8.5 g 2   clobetasol cream (TEMOVATE) 0.05 %      Fluticasone-Umeclidin-Vilant (TRELEGY ELLIPTA) 100-62.5-25 MCG/INH AEPB INHALE 1 PUFF INTO THE LUNGS DAILY. 60 each 11   HYDROcodone-acetaminophen (NORCO/VICODIN) 5-325 MG tablet Take 1 tablet by mouth every 6 (six) hours as needed for moderate pain. 30 tablet 0   lisinopril (ZESTRIL)  20 MG tablet Take 20 mg by mouth daily.     methylPREDNISolone (MEDROL) 4 MG tablet Medrol dose pack. Take as instructed 21 tablet 0   omeprazole (PRILOSEC) 20 MG capsule Take by mouth.     SKYRIZI, 150 MG DOSE, 75 MG/0.83ML PSKT   0   No facility-administered medications prior to visit.    No Known Allergies  ROS Review of Systems  All other systems reviewed and are negative.    Objective:    Physical Exam Constitutional:      Appearance: Normal appearance.  Neurological:     Mental Status: He is alert.    There were no vitals taken for this visit. Wt Readings from Last 3 Encounters:  10/05/20 218 lb 6.4 oz (99.1 kg)  03/15/20 210 lb (95.3 kg)  09/30/19 211 lb 12.8 oz (96.1 kg)     Health Maintenance Due  Topic Date Due   Hepatitis C Screening  Never done   TETANUS/TDAP  Never done   Zoster Vaccines- Shingrix (1 of 2) Never done   COLONOSCOPY (Pts 45-6yr Insurance coverage will need to be confirmed)  Never done   PNA vac Low Risk Adult (1 of 2 - PCV13) 02/24/2020   COVID-19 Vaccine (4 - Booster) 02/13/2021    There are no preventive care reminders to display for this patient.  No results found for: TSH Lab Results  Component Value Date   WBC 9.2 10/10/2020   HGB 15.4 10/10/2020   HCT 45.8 10/10/2020   MCV 100.2 (H) 10/10/2020   PLT 228 10/10/2020   Lab Results  Component Value Date   NA 134 (L) 10/10/2020   K 4.5 10/10/2020   CO2 21 10/10/2020   GLUCOSE 96 10/10/2020   BUN 23 10/10/2020   CREATININE 1.06 10/10/2020   BILITOT 0.6 10/10/2020   AST 33 10/10/2020   ALT 74 (H) 10/10/2020   PROT 7.4 10/10/2020   CALCIUM 9.7 10/10/2020   ANIONGAP 12 11/03/2017   Lab Results  Component Value Date   CHOL 218 (H) 10/10/2020   Lab Results  Component Value Date   HDL 46 10/10/2020   Lab Results  Component Value Date   LDLCALC 143 (H) 10/10/2020   Lab Results  Component Value Date   TRIG 157 (H) 10/10/2020   Lab Results  Component Value Date    CHOLHDL 4.7 10/10/2020   No results found for: HGBA1C    Assessment & Plan:  1. Plaque psoriasis Well controlled at this time.  2. Injection education, encounter for '150mg'$  Sub cut injection of Skyrizi given in Right abdomen at this time. Patient tolerated well.     Problem List Items Addressed This Visit   None   No orders of the defined types were placed in this encounter.   Follow-up: No follow-ups on file.    Kendell Bane, NP

## 2021-09-10 DIAGNOSIS — M5136 Other intervertebral disc degeneration, lumbar region: Secondary | ICD-10-CM | POA: Insufficient documentation

## 2021-09-10 DIAGNOSIS — M4726 Other spondylosis with radiculopathy, lumbar region: Secondary | ICD-10-CM | POA: Insufficient documentation

## 2021-09-28 ENCOUNTER — Telehealth: Payer: Self-pay | Admitting: Acute Care

## 2021-09-28 NOTE — Telephone Encounter (Signed)
Called patient to attempt to schedule Low Dose CT Chest.  Pt stated that "he wanted to opt out of this of this program due to his insurance not paying for this test." Pt states "that it cost too much money".   Will send message to Doroteo Glassman, RN. Rhonda J Cobb

## 2021-10-01 NOTE — Telephone Encounter (Signed)
Left message for pt to call to discuss coverage for lung screening.

## 2021-10-02 ENCOUNTER — Other Ambulatory Visit: Payer: Self-pay | Admitting: *Deleted

## 2021-10-02 DIAGNOSIS — Z87891 Personal history of nicotine dependence: Secondary | ICD-10-CM

## 2021-10-15 NOTE — Telephone Encounter (Signed)
Spoke with pt regarding lung cancer screening coverage. I advised pt that when he decides to go on Medicare that it will cover the lung screening CT at 100%. PT states that he wants to wait on having low dose CT at this time. He will call back if he decides to participate once he goes on Medicare.

## 2021-12-03 ENCOUNTER — Other Ambulatory Visit: Payer: Self-pay | Admitting: Pulmonary Disease

## 2021-12-04 ENCOUNTER — Telehealth: Payer: Self-pay | Admitting: Pulmonary Disease

## 2021-12-04 ENCOUNTER — Other Ambulatory Visit: Payer: Self-pay | Admitting: Pulmonary Disease

## 2021-12-05 NOTE — Telephone Encounter (Signed)
Called and spoke with Patient.  Trelegy refill sent to [facility] per Patient request.  Patient has OV scheduled 12/13/21, with Dr. Vaughan Browner.  Nothing further at this time.

## 2021-12-13 ENCOUNTER — Ambulatory Visit (INDEPENDENT_AMBULATORY_CARE_PROVIDER_SITE_OTHER): Payer: PRIVATE HEALTH INSURANCE | Admitting: Pulmonary Disease

## 2021-12-13 ENCOUNTER — Other Ambulatory Visit: Payer: Self-pay

## 2021-12-13 ENCOUNTER — Ambulatory Visit (INDEPENDENT_AMBULATORY_CARE_PROVIDER_SITE_OTHER): Payer: PRIVATE HEALTH INSURANCE

## 2021-12-13 ENCOUNTER — Encounter: Payer: Self-pay | Admitting: Pulmonary Disease

## 2021-12-13 VITALS — BP 124/72 | HR 110 | Temp 97.9°F | Ht 68.0 in | Wt 220.0 lb

## 2021-12-13 DIAGNOSIS — R0602 Shortness of breath: Secondary | ICD-10-CM

## 2021-12-13 DIAGNOSIS — J449 Chronic obstructive pulmonary disease, unspecified: Secondary | ICD-10-CM

## 2021-12-13 MED ORDER — TRELEGY ELLIPTA 100-62.5-25 MCG/ACT IN AEPB: 1.0000 | INHALATION_SPRAY | Freq: Every day | RESPIRATORY_TRACT | 5 refills | Status: DC

## 2021-12-13 NOTE — Patient Instructions (Addendum)
We will renew the trelegy for 1 year Once he gets on the Medicare give [place] a call so that we can get you back on the lung cancer screening program  Follow-up in 1 year.

## 2021-12-13 NOTE — Progress Notes (Signed)
David King    [id number]    18-May-1956  Primary Care Physician:[name], Dwyane Luo, MD  Referring Physician: [name], MD [place],  [place] [id number]  Chief complaint: Follow up for COPD, lung nodules  HPI: 65 year old former smoker with history of psoriasis, prostate cancer, arthritis, anxiety, migraines, diverticulosis. He has complaints of progressive dyspnea on exertion for the past 10 months.  He is symptomatic with rest and as well.  Denies any cough, sputum production, wheezing. He was seen in the ED on 11/03/17 with an x-ray that was negative for any acute abnormality.  He did not have any interventions done.  Evaluated by cardiology at [facility] for unstable angina.  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  Quit in January 2018 Travel History: not significant  Interim history: Doing well with Trelegy inhaler.  No complaints today He stopped lung cancer screening as his current insurance apparently does not cover the procedure  Outpatient Encounter Medications as of 12/13/2021  Medication Sig   albuterol (VENTOLIN HFA) 108 (90 Base) MCG/ACT inhaler USE 1-2 PUFFS EVERY 6 HOURS AS NEEDED FOR WHEEZING OR SHORTNESS OF BREATH.   Fluticasone-Umeclidin-Vilant (TRELEGY ELLIPTA) 100-62.5-25 MCG/ACT AEPB INHALE 1 PUFF INTO THE LUNGS DAILY.   lisinopril (ZESTRIL) 20 MG tablet Take 20 mg by mouth daily.   omeprazole (PRILOSEC) 20 MG capsule Take by mouth.   SKYRIZI, 150 MG DOSE, 75 MG/0.83ML PSKT    clobetasol cream (TEMOVATE) 0.05 %    HYDROcodone-acetaminophen (NORCO/VICODIN) 5-325 MG tablet Take 1 tablet by mouth every 6 (six) hours as needed for moderate pain.   methylPREDNISolone (MEDROL) 4 MG  tablet Medrol dose pack. Take as instructed   No facility-administered encounter medications on file as of 12/13/2021.   Physical Exam: Blood pressure 124/72, pulse (!) 110, temperature 97.9 F (36.6 C), temperature source Oral, height 5\' 8"  (1.727 m), weight 220 lb (99.8 kg), SpO2 97 %. Gen:      No acute distress HEENT:  EOMI, sclera anicteric Neck:     No masses; no thyromegaly Lungs:    Clear to auscultation bilaterally; normal respiratory effort CV:         Regular rate and rhythm; no murmurs Abd:      + bowel sounds; soft, non-tender; no palpable masses, no distension Ext:    No edema; adequate peripheral perfusion Skin:      Warm and dry; no rash Neuro: alert and oriented x 3 Psych: normal mood and affect   Data Reviewed: Imaging CT abdomen 03/19/13-visualized lung bases are clear. Screening CT chest 11/14/17-mild emphysema with lower lobe bronchiectasis.  3.5 mm nodule in left upper lobe. Screening CT chest 11/18/2018- new irregular 7 mm nodule in the left upper lobe.  3.5 mm nodule is stable Screening CT chest 02/19/2019- 7 mm nodule previously identified has most completely resolved.  There is a new left upper lobe nodule measuring 5.7 mm. Screening CT chest 08/26/2019- near resolution of left upper lobe nodule.  Additional pulmonary nodules are stable Screening CT chest 09/05/2020-emphysema, stable pulmonary nodule.  Bronchiectasis with bronchial wall thickening.  PFTs 12/11/17 FVC 3.93 (87%], FEV1 2.67 [79%], F/F 68, TLC 113%, RV/TLC 147%, DLCO 78% Moderate obstruction with minimal diffusion defect.  Positive bronchodilator effect. Air trapping.  Labs CBC 06/18/2018-WBC 8.4, eos 0.9%, absolute eosinophil count 76 Alpha-1 antitrypsin 06/26/2018-157, PI MM Blood allergy profile 06/26/2018-IgE 130, RAST panel negative  Assessment:  COPD PFTs reviewed which showed moderate- obstruction with bronchodilator response.   Continues on Trelegy inhaler.  Not clear to me if he needs  inhaled steroids as peripheral eosinophils are low however he feels this works better for him than the Anoro.   GERD Continue on PPI   Pulmonary nodules He has not kept up with lung cancer screening.  Is planning to go on Medicare on January and wants to resume when it is not covered by the insurance.  Let us know when his Medicare is active so that we can put in the order to resume CT screening  Health maintenance Does not want influenza vaccine 02/23/2019-Pneumovax  Up-to-date with COVID-19 vaccination  Plan/Recommendations: - Continue Trelegy - Resume annual low-dose screening CT of the chest when he is on Medicare  [name] MD [facility] 12/13/2021, 2:03 PM  CC: [name], MD

## 2021-12-20 ENCOUNTER — Encounter: Payer: Self-pay | Admitting: *Deleted

## 2022-01-02 ENCOUNTER — Telehealth: Payer: Self-pay | Admitting: Pulmonary Disease

## 2022-01-02 NOTE — Telephone Encounter (Signed)
Pt calling- states his insurance lapsed and was supposed to get Medicare and that didn't happen. Pt states he's looked on good rx, etc for discount/coupon for Trelegy and medication will still be $600. Pt unable to afford this. Wanting to know if we have any resources for him- samples, coupons, or pt assistance info. Working on Print production planner taken care of, but until then in [place].Please advise [phone]

## 2022-01-02 NOTE — Telephone Encounter (Signed)
I called and was unable to leave a message for this patient.

## 2022-01-03 ENCOUNTER — Other Ambulatory Visit: Payer: Self-pay | Admitting: Pulmonary Disease

## 2022-01-03 MED ORDER — TRELEGY ELLIPTA 100-62.5-25 MCG/ACT IN AEPB: 1.0000 | INHALATION_SPRAY | Freq: Every day | RESPIRATORY_TRACT | 0 refills | Status: DC

## 2022-01-03 NOTE — Telephone Encounter (Signed)
Called patient but received a message stating that his phone was not receiving calls at this time. Will attempt to call him back later today.

## 2022-01-03 NOTE — Telephone Encounter (Signed)
Patient called the office back. He stated that he is in the process of getting his insurance back but will need some coverage until the insurance is active again. I advised him that we could go ahead and start the process of applying for patient assistance. He agreed. He is aware that I will place the application and 2 samples at the front desk. He verbalized understanding.  Nothing further needed at time of call.

## 2022-01-04 ENCOUNTER — Other Ambulatory Visit: Payer: Self-pay | Admitting: *Deleted

## 2022-01-04 NOTE — Telephone Encounter (Signed)
Patient completed Trelegy [employer] assistance application.  Will have Dr. Vaughan Browner sign Trelegy prescription when he returns to office 01/07/22.

## 2022-01-07 ENCOUNTER — Other Ambulatory Visit: Payer: Self-pay | Admitting: *Deleted

## 2022-01-07 MED ORDER — TRELEGY ELLIPTA 100-62.5-25 MCG/ACT IN AEPB: 1.0000 | INHALATION_SPRAY | Freq: Every day | RESPIRATORY_TRACT | 5 refills | Status: DC

## 2022-01-09 NOTE — Telephone Encounter (Signed)
[place] patient assistance application with signed Trelegy prescription faxed to [phone].  Confirmation received.  Nothing further at this time.

## 2022-02-20 DIAGNOSIS — K432 Incisional hernia without obstruction or gangrene: Secondary | ICD-10-CM | POA: Insufficient documentation

## 2022-02-20 DIAGNOSIS — I739 Peripheral vascular disease, unspecified: Secondary | ICD-10-CM | POA: Insufficient documentation

## 2022-04-12 DIAGNOSIS — K432 Incisional hernia without obstruction or gangrene: Secondary | ICD-10-CM | POA: Diagnosis not present

## 2022-04-23 DIAGNOSIS — L400 Psoriasis vulgaris: Secondary | ICD-10-CM | POA: Diagnosis not present

## 2022-04-24 DIAGNOSIS — Z48812 Encounter for surgical aftercare following surgery on the circulatory system: Secondary | ICD-10-CM | POA: Diagnosis not present

## 2022-04-24 DIAGNOSIS — I739 Peripheral vascular disease, unspecified: Secondary | ICD-10-CM | POA: Diagnosis not present

## 2022-04-26 ENCOUNTER — Ambulatory Visit: Payer: Medicare Other | Admitting: Podiatry

## 2022-04-26 DIAGNOSIS — B351 Tinea unguium: Secondary | ICD-10-CM

## 2022-04-26 DIAGNOSIS — M79674 Pain in right toe(s): Secondary | ICD-10-CM

## 2022-04-26 DIAGNOSIS — M79675 Pain in left toe(s): Secondary | ICD-10-CM | POA: Diagnosis not present

## 2022-04-29 DIAGNOSIS — Z79899 Other long term (current) drug therapy: Secondary | ICD-10-CM | POA: Diagnosis not present

## 2022-04-29 DIAGNOSIS — L400 Psoriasis vulgaris: Secondary | ICD-10-CM | POA: Diagnosis not present

## 2022-05-03 ENCOUNTER — Encounter: Payer: Self-pay | Admitting: Podiatry

## 2022-05-03 NOTE — Progress Notes (Signed)
?  Subjective:  ?Patient ID: David King, male    DOB: 11/05/1956,  MRN: [id number] ? ?Chief Complaint  ?Patient presents with  ? Nail Problem  ? ?66 y.o. male returns for the above complaint.  Patient presents with thickened elongated dystrophic toenails x10.  Mild pain on palpation.  Pain with ambulation has progressive gotten worse. ? ?Objective:  ?There were no vitals filed for this visit. ?Podiatric Exam: ?Vascular: dorsalis pedis and posterior tibial pulses are palpable bilateral. Capillary return is immediate. Temperature gradient is WNL. Skin turgor WNL  ?Sensorium: Normal Semmes Weinstein monofilament test. Normal tactile sensation bilaterally. ?Nail Exam: Pt has thick disfigured discolored nails with subungual debris noted bilateral entire nail hallux through fifth toenails.  Pain on palpation to the nails. ?Ulcer Exam: There is no evidence of ulcer or pre-ulcerative changes or infection. ?Orthopedic Exam: Muscle tone and strength are WNL. No limitations in general ROM. No crepitus or effusions noted.  ?Skin: No Porokeratosis. No infection or ulcers ? ? ? ?Assessment & Plan:  ? ?1. Pain due to onychomycosis of toenails of both feet   ? ? ?Patient was evaluated and treated and all questions answered. ? ?Onychomycosis with pain  ?-Nails palliatively debrided as below. ?-Educated on self-care ? ?Procedure: Nail Debridement ?Rationale: pain  ?Type of Debridement: manual, sharp debridement. ?Instrumentation: Nail nipper, rotary burr. ?Number of Nails: 10 ? ?Procedures and Treatment: Consent by patient was obtained for treatment procedures. The patient understood the discussion of treatment and procedures well. All questions were answered thoroughly reviewed. Debridement of mycotic and hypertrophic toenails, 1 through 5 bilateral and clearing of subungual debris. No ulceration, no infection noted.  ?Return Visit-Office Procedure: Patient instructed to return to the office for a follow up visit 3 months for  continued evaluation and treatment. ? ?Boneta Lucks, DPM ?  ? ?No follow-ups on file. ? ?

## 2022-05-13 ENCOUNTER — Ambulatory Visit
Admission: RE | Admit: 2022-05-13 | Discharge: 2022-05-13 | Disposition: A | Discharge status: Home / Self Care | Payer: Medicare Other | Source: Ambulatory Visit | Attending: Family Medicine | Admitting: Family Medicine

## 2022-05-13 ENCOUNTER — Other Ambulatory Visit: Payer: Self-pay | Admitting: Family Medicine

## 2022-05-13 DIAGNOSIS — R0781 Pleurodynia: Secondary | ICD-10-CM | POA: Diagnosis not present

## 2022-05-13 DIAGNOSIS — M25512 Pain in left shoulder: Secondary | ICD-10-CM | POA: Diagnosis not present

## 2022-05-13 DIAGNOSIS — W19XXXA Unspecified fall, initial encounter: Secondary | ICD-10-CM

## 2022-05-13 DIAGNOSIS — R058 Other specified cough: Secondary | ICD-10-CM | POA: Diagnosis not present

## 2022-05-13 DIAGNOSIS — Z043 Encounter for examination and observation following other accident: Secondary | ICD-10-CM | POA: Diagnosis not present

## 2022-05-13 DIAGNOSIS — R059 Cough, unspecified: Secondary | ICD-10-CM

## 2022-05-14 DIAGNOSIS — L400 Psoriasis vulgaris: Secondary | ICD-10-CM | POA: Diagnosis not present

## 2022-05-31 ENCOUNTER — Ambulatory Visit: Payer: Self-pay | Admitting: General Surgery

## 2022-05-31 DIAGNOSIS — K432 Incisional hernia without obstruction or gangrene: Secondary | ICD-10-CM | POA: Diagnosis not present

## 2022-05-31 NOTE — H&P (Signed)
Chief Complaint: New Patient (Incisional hernia )       History of Present Illness: David King is a 66 y.o. male who is seen today as an office consultation at the request of Dr. Zigmund Daniel for evaluation of New Patient (Incisional hernia ) .   Patient is a 66 year old male, with a history of peripheral vascular disease, hypertension, hyperlipidemia-currently on Plavix.   Patient states he recently had a lumbar fusion surgery and October 2022.  He states that soon thereafter he noticed a bulge to the lower midline incision area.  He states that thereafter he had an increase in size of the hernia.  He states he has no pain however has noticed got larger.   Patient has had an ultrasound however no CT scan.   Patient has undergone vascular stent placement by Dr. Laurance Flatten at [place].  Currently on Plavix.       Review of Systems: A complete review of systems was obtained from the patient.  I have reviewed this information and discussed as appropriate with the patient.  See HPI as well for other ROS.   Review of Systems  Constitutional: Negative for fever.  HENT: Negative for congestion.   Eyes: Negative for blurred vision.  Respiratory: Negative for cough, shortness of breath and wheezing.   Cardiovascular: Negative for chest pain and palpitations.  Gastrointestinal: Negative for heartburn.  Genitourinary: Negative for dysuria.  Musculoskeletal: Negative for myalgias.  Skin: Negative for rash.  Neurological: Negative for dizziness and headaches.  Psychiatric/Behavioral: Negative for depression and suicidal ideas.  All other systems reviewed and are negative.       Medical History: Past Medical History Past Medical History: Diagnosis Date  COPD (chronic obstructive pulmonary disease) (CMS-HCC)        There is no problem list on file for this patient.     Past Surgical History History reviewed. No pertinent surgical history.     Allergies No Known Allergies    Current  Outpatient Medications on File Prior to Visit Medication Sig Dispense Refill  gabapentin (NEURONTIN) 300 MG capsule 2 capsules      lisinopriL (ZESTRIL) 20 MG tablet 1 tablet      pravastatin (PRAVACHOL) 20 MG tablet 1 tablet       No current facility-administered medications on file prior to visit.     Family History History reviewed. No pertinent family history.     Social History   Tobacco Use Smoking Status Former  Types: Cigarettes Smokeless Tobacco Never     Social History Social History    Socioeconomic History  Marital status: Married Tobacco Use  Smoking status: Former     Types: Cigarettes  Smokeless tobacco: Never Substance and Sexual Activity  Alcohol use: Yes  Drug use: Never      Objective:     Vitals:   05/31/22 1145 BP: 128/76 Pulse: 84 Temp: 36.2 C (97.2 F) SpO2: 96% Weight: 92.4 kg (203 lb 9.6 oz) Height: 172.7 cm ('5\' 8"'$ )   Body mass index is 30.96 kg/m.   Physical Exam Constitutional:      General: He is not in acute distress.    Appearance: Normal appearance.  HENT:     Head: Normocephalic.     Nose: No rhinorrhea.     Mouth/Throat:     Mouth: Mucous membranes are moist.     Pharynx: Oropharynx is clear.  Eyes:     General: No scleral icterus.    Pupils: Pupils are equal, round, and reactive to  light.  Cardiovascular:     Rate and Rhythm: Normal rate.     Pulses: Normal pulses.  Pulmonary:     Effort: Pulmonary effort is normal. No respiratory distress.     Breath sounds: No stridor. No wheezing.  Abdominal:     General: Abdomen is flat. There is no distension.     Tenderness: There is no abdominal tenderness. There is no guarding or rebound.     Hernia: A hernia is present.    Musculoskeletal:        General: Normal range of motion.     Cervical back: Normal range of motion and neck supple.  Skin:    General: Skin is warm and dry.     Capillary Refill: Capillary refill takes less than 2 seconds.     Coloration:  Skin is not jaundiced.  Neurological:     General: No focal deficit present.     Mental Status: He is alert and oriented to person, place, and time. Mental status is at baseline.  Psychiatric:        Mood and Affect: Mood normal.        Thought Content: Thought content normal.        Judgment: Judgment normal.        Hernia Size: 8 cm Incarcerated: No Initial Hernia     Assessment and Plan: Diagnoses and all orders for this visit:   Incisional hernia without obstruction or gangrene     David King is a 66 y.o. male  Patient is a 66 year old male with an incisional hernia.  Patient also with peripheral vascular disease, hyperlipidemia and hypertension. We will obtain clearance to be off his Plavix from Dr. Laurance Flatten. In the interim we will obtain a CT scan of his abdomen pelvis to take a look at the diameter of the hernia as well as any type of incarceration with bowel.           1.  We will proceed to the OR for a robotic incisional  hernia repair with mesh- T AR versus retrorectus.. 2. All risks and benefits were discussed with the patient, to generally include infection, bleeding, damage to surrounding structures, acute and chronic nerve pain, and recurrence. Alternatives were offered and described.  All questions were answered and the patient voiced understanding of the procedure and wishes to proceed at this point.             No follow-ups on file.   Ralene Ok, MD, [facility], [place] [facility]

## 2022-05-31 NOTE — H&P (View-Only) (Signed)
Chief Complaint: New Patient (Incisional hernia )       History of Present Illness: David King is a 66 y.o. male who is seen today as an office consultation at the request of Dr. Zigmund Daniel for evaluation of New Patient (Incisional hernia ) .   Patient is a 66 year old male, with a history of peripheral vascular disease, hypertension, hyperlipidemia-currently on Plavix.   Patient states he recently had a lumbar fusion surgery and October 2022.  He states that soon thereafter he noticed a bulge to the lower midline incision area.  He states that thereafter he had an increase in size of the hernia.  He states he has no pain however has noticed got larger.   Patient has had an ultrasound however no CT scan.   Patient has undergone vascular stent placement by Dr. Laurance Flatten at [place].  Currently on Plavix.       Review of Systems: A complete review of systems was obtained from the patient.  I have reviewed this information and discussed as appropriate with the patient.  See HPI as well for other ROS.   Review of Systems  Constitutional: Negative for fever.  HENT: Negative for congestion.   Eyes: Negative for blurred vision.  Respiratory: Negative for cough, shortness of breath and wheezing.   Cardiovascular: Negative for chest pain and palpitations.  Gastrointestinal: Negative for heartburn.  Genitourinary: Negative for dysuria.  Musculoskeletal: Negative for myalgias.  Skin: Negative for rash.  Neurological: Negative for dizziness and headaches.  Psychiatric/Behavioral: Negative for depression and suicidal ideas.  All other systems reviewed and are negative.       Medical History: Past Medical History Past Medical History: Diagnosis Date  COPD (chronic obstructive pulmonary disease) (CMS-HCC)        There is no problem list on file for this patient.     Past Surgical History History reviewed. No pertinent surgical history.     Allergies No Known Allergies    Current  Outpatient Medications on File Prior to Visit Medication Sig Dispense Refill  gabapentin (NEURONTIN) 300 MG capsule 2 capsules      lisinopriL (ZESTRIL) 20 MG tablet 1 tablet      pravastatin (PRAVACHOL) 20 MG tablet 1 tablet       No current facility-administered medications on file prior to visit.     Family History History reviewed. No pertinent family history.     Social History   Tobacco Use Smoking Status Former  Types: Cigarettes Smokeless Tobacco Never     Social History Social History    Socioeconomic History  Marital status: Married Tobacco Use  Smoking status: Former     Types: Cigarettes  Smokeless tobacco: Never Substance and Sexual Activity  Alcohol use: Yes  Drug use: Never      Objective:     Vitals:   05/31/22 1145 BP: 128/76 Pulse: 84 Temp: 36.2 C (97.2 F) SpO2: 96% Weight: 92.4 kg (203 lb 9.6 oz) Height: 172.7 cm ('5\' 8"'$ )   Body mass index is 30.96 kg/m.   Physical Exam Constitutional:      General: He is not in acute distress.    Appearance: Normal appearance.  HENT:     Head: Normocephalic.     Nose: No rhinorrhea.     Mouth/Throat:     Mouth: Mucous membranes are moist.     Pharynx: Oropharynx is clear.  Eyes:     General: No scleral icterus.    Pupils: Pupils are equal, round, and reactive to  light.  Cardiovascular:     Rate and Rhythm: Normal rate.     Pulses: Normal pulses.  Pulmonary:     Effort: Pulmonary effort is normal. No respiratory distress.     Breath sounds: No stridor. No wheezing.  Abdominal:     General: Abdomen is flat. There is no distension.     Tenderness: There is no abdominal tenderness. There is no guarding or rebound.     Hernia: A hernia is present.    Musculoskeletal:        General: Normal range of motion.     Cervical back: Normal range of motion and neck supple.  Skin:    General: Skin is warm and dry.     Capillary Refill: Capillary refill takes less than 2 seconds.     Coloration:  Skin is not jaundiced.  Neurological:     General: No focal deficit present.     Mental Status: He is alert and oriented to person, place, and time. Mental status is at baseline.  Psychiatric:        Mood and Affect: Mood normal.        Thought Content: Thought content normal.        Judgment: Judgment normal.        Hernia Size: 8 cm Incarcerated: No Initial Hernia     Assessment and Plan: Diagnoses and all orders for this visit:   Incisional hernia without obstruction or gangrene     David King is a 66 y.o. male  Patient is a 66 year old male with an incisional hernia.  Patient also with peripheral vascular disease, hyperlipidemia and hypertension. We will obtain clearance to be off his Plavix from Dr. Laurance Flatten. In the interim we will obtain a CT scan of his abdomen pelvis to take a look at the diameter of the hernia as well as any type of incarceration with bowel.           1.  We will proceed to the OR for a robotic incisional  hernia repair with mesh- T AR versus retrorectus.. 2. All risks and benefits were discussed with the patient, to generally include infection, bleeding, damage to surrounding structures, acute and chronic nerve pain, and recurrence. Alternatives were offered and described.  All questions were answered and the patient voiced understanding of the procedure and wishes to proceed at this point.             No follow-ups on file.   Ralene Ok, MD, [facility], [place] [facility]

## 2022-06-04 ENCOUNTER — Other Ambulatory Visit (HOSPITAL_BASED_OUTPATIENT_CLINIC_OR_DEPARTMENT_OTHER): Payer: Self-pay | Admitting: General Surgery

## 2022-06-04 ENCOUNTER — Other Ambulatory Visit: Payer: Self-pay | Admitting: General Surgery

## 2022-06-04 DIAGNOSIS — K432 Incisional hernia without obstruction or gangrene: Secondary | ICD-10-CM

## 2022-06-07 NOTE — Progress Notes (Signed)
Surgical Instructions    Your procedure is scheduled on June 13th Tuesday .  Report to [facility] Main Entrance "A" at 5:30 A.M., then check in with the Admitting office.  Call this number if you have problems the morning of surgery:  [phone]   If you have any questions prior to your surgery date call [phone]: Open Monday-Friday 8am-4pm    Remember:  Do not eat after midnight the night before your surgery  You may drink clear liquids until 4:30am the morning of your surgery.   Clear liquids allowed are: Water, Non-Citrus Juices (without pulp), Carbonated Beverages, Clear Tea, Black Coffee ONLY (NO MILK, CREAM OR POWDERED CREAMER of any kind), and Gatorade    Take these medicines the morning of surgery with A SIP OF WATER: acetaminophen (TYLENOL) 500 MG tablet Fluticasone-Umeclidin-Vilant (TRELEGY ELLIPTA) 100-62.5-25 MCG/ACT AEPB - please bring with you to the hospital gabapentin (NEURONTIN) 300 MG capsule omeprazole (PRILOSEC) 20 MG capsule pravastatin (PRAVACHOL) 20 MG tablet   IF NEEDED albuterol (VENTOLIN HFA) 108 (90 Base) MCG/ACT inhaler - please bring with you to the hospital    Follow your surgeon's instructions on when to stop Plavix.  If no instructions were given by your surgeon then you will need to call the office to get those instructions.    As of today, STOP taking any Aspirin (unless otherwise instructed by your surgeon) Aleve, Naproxen, Ibuprofen, Motrin, Advil, Goody's, BC's, all herbal medications, fish oil, and all vitamins.           Do not wear jewelry  Do not wear lotions, powders, colognes, or deodorant. Do not shave 48 hours prior to surgery.  Men may shave face and neck. Do not bring valuables to the hospital. Do not wear nail polish  [facility] is not responsible for any belongings or valuables. .   Do NOT Smoke (Tobacco/Vaping)  24 hours prior to your procedure  If you use a CPAP at night, you may bring your mask for your overnight  stay.   Contacts, glasses, hearing aids, dentures or partials may not be worn into surgery, please bring cases for these belongings   For patients admitted to the hospital, discharge time will be determined by your treatment team.   Patients discharged the day of surgery will not be allowed to drive home, and someone needs to stay with them for 24 hours.   SURGICAL WAITING ROOM VISITATION Patients having surgery or a procedure in a hospital may have two support people. Children under the age of 67 must have an adult with them who is not the patient. They may stay in the waiting area during the procedure and may switch out with other visitors. If the patient needs to stay at the hospital during part of their recovery, the visitor guidelines for inpatient rooms apply.  Please refer to the [facility] website for the visitor guidelines for Inpatients (after your surgery is over and you are in a regular room).       Special instructions:    Oral Hygiene is also important to reduce your risk of infection.  Remember - BRUSH YOUR TEETH THE MORNING OF SURGERY WITH YOUR REGULAR TOOTHPASTE   [place]- Preparing For Surgery  Before surgery, you can play an important role. Because skin is not sterile, your skin needs to be as free of germs as possible. You can reduce the number of germs on your skin by washing with CHG (chlorahexidine gluconate) Soap before surgery.  CHG is an  antiseptic cleaner which kills germs and bonds with the skin to continue killing germs even after washing.     Please do not use if you have an allergy to CHG or antibacterial soaps. If your skin becomes reddened/irritated stop using the CHG.  Do not shave (including legs and underarms) for at least 48 hours prior to first CHG shower. It is OK to shave your face.  Please follow these instructions carefully.     Shower the NIGHT BEFORE SURGERY and the MORNING OF SURGERY with CHG Soap.   If you chose to wash your hair,  wash your hair first as usual with your normal shampoo. After you shampoo, rinse your hair and body thoroughly to remove the shampoo.  Then [employer] and genitals (private parts) with your normal soap and rinse thoroughly to remove soap.  After that Use CHG Soap as you would any other liquid soap. You can apply CHG directly to the skin and wash gently with a scrungie or a clean washcloth.   Apply the CHG Soap to your body ONLY FROM THE NECK DOWN.  Do not use on open wounds or open sores. Avoid contact with your eyes, ears, mouth and genitals (private parts). Wash Face and genitals (private parts)  with your normal soap.   Wash thoroughly, paying special attention to the area where your surgery will be performed.  Thoroughly rinse your body with warm water from the neck down.  DO NOT shower/wash with your normal soap after using and rinsing off the CHG Soap.  Pat yourself dry with a CLEAN TOWEL.  Wear CLEAN PAJAMAS to bed the night before surgery  Place CLEAN SHEETS on your bed the night before your surgery  DO NOT SLEEP WITH PETS.   Day of Surgery:  Take a shower with CHG soap. Wear Clean/Comfortable clothing the morning of surgery Do not apply any deodorants/lotions.   Remember to brush your teeth WITH YOUR REGULAR TOOTHPASTE.    If you received a COVID test during your pre-op visit, it is requested that you wear a mask when out in public, stay away from anyone that may not be feeling well, and notify your surgeon if you develop symptoms. If you have been in contact with anyone that has tested positive in the last 10 days, please notify your surgeon.    Please read over the following fact sheets that you were given.

## 2022-06-10 ENCOUNTER — Encounter (HOSPITAL_COMMUNITY)
Admission: RE | Admit: 2022-06-10 | Discharge: 2022-06-10 | Disposition: A | Discharge status: Home / Self Care | Payer: Medicare Other | Source: Ambulatory Visit | Attending: General Surgery | Admitting: General Surgery

## 2022-06-10 ENCOUNTER — Other Ambulatory Visit: Payer: Self-pay

## 2022-06-10 ENCOUNTER — Encounter (HOSPITAL_COMMUNITY): Payer: Self-pay

## 2022-06-10 ENCOUNTER — Ambulatory Visit (HOSPITAL_BASED_OUTPATIENT_CLINIC_OR_DEPARTMENT_OTHER)
Admission: RE | Admit: 2022-06-10 | Discharge: 2022-06-10 | Disposition: A | Discharge status: Home / Self Care | Payer: Medicare Other | Source: Ambulatory Visit | Attending: General Surgery | Admitting: General Surgery

## 2022-06-10 ENCOUNTER — Encounter (HOSPITAL_BASED_OUTPATIENT_CLINIC_OR_DEPARTMENT_OTHER): Payer: Self-pay

## 2022-06-10 VITALS — BP 131/88 | HR 77 | Temp 98.5°F | Resp 17 | Ht 69.0 in | Wt 199.5 lb

## 2022-06-10 DIAGNOSIS — K460 Unspecified abdominal hernia with obstruction, without gangrene: Secondary | ICD-10-CM | POA: Diagnosis not present

## 2022-06-10 DIAGNOSIS — K9189 Other postprocedural complications and disorders of digestive system: Secondary | ICD-10-CM | POA: Diagnosis not present

## 2022-06-10 DIAGNOSIS — D72829 Elevated white blood cell count, unspecified: Secondary | ICD-10-CM | POA: Diagnosis not present

## 2022-06-10 DIAGNOSIS — K5730 Diverticulosis of large intestine without perforation or abscess without bleeding: Secondary | ICD-10-CM | POA: Diagnosis not present

## 2022-06-10 DIAGNOSIS — R109 Unspecified abdominal pain: Secondary | ICD-10-CM | POA: Diagnosis not present

## 2022-06-10 DIAGNOSIS — Z87891 Personal history of nicotine dependence: Secondary | ICD-10-CM | POA: Diagnosis not present

## 2022-06-10 DIAGNOSIS — J9811 Atelectasis: Secondary | ICD-10-CM | POA: Diagnosis not present

## 2022-06-10 DIAGNOSIS — Z01818 Encounter for other preprocedural examination: Secondary | ICD-10-CM | POA: Insufficient documentation

## 2022-06-10 DIAGNOSIS — K660 Peritoneal adhesions (postprocedural) (postinfection): Secondary | ICD-10-CM | POA: Diagnosis not present

## 2022-06-10 DIAGNOSIS — E785 Hyperlipidemia, unspecified: Secondary | ICD-10-CM | POA: Diagnosis not present

## 2022-06-10 DIAGNOSIS — K760 Fatty (change of) liver, not elsewhere classified: Secondary | ICD-10-CM | POA: Diagnosis not present

## 2022-06-10 DIAGNOSIS — I739 Peripheral vascular disease, unspecified: Secondary | ICD-10-CM | POA: Diagnosis not present

## 2022-06-10 DIAGNOSIS — K56609 Unspecified intestinal obstruction, unspecified as to partial versus complete obstruction: Secondary | ICD-10-CM | POA: Diagnosis not present

## 2022-06-10 DIAGNOSIS — E860 Dehydration: Secondary | ICD-10-CM | POA: Diagnosis not present

## 2022-06-10 DIAGNOSIS — K432 Incisional hernia without obstruction or gangrene: Secondary | ICD-10-CM | POA: Insufficient documentation

## 2022-06-10 DIAGNOSIS — E876 Hypokalemia: Secondary | ICD-10-CM | POA: Diagnosis not present

## 2022-06-10 DIAGNOSIS — M47816 Spondylosis without myelopathy or radiculopathy, lumbar region: Secondary | ICD-10-CM | POA: Diagnosis not present

## 2022-06-10 DIAGNOSIS — J449 Chronic obstructive pulmonary disease, unspecified: Secondary | ICD-10-CM | POA: Diagnosis not present

## 2022-06-10 DIAGNOSIS — M1990 Unspecified osteoarthritis, unspecified site: Secondary | ICD-10-CM | POA: Diagnosis not present

## 2022-06-10 DIAGNOSIS — N281 Cyst of kidney, acquired: Secondary | ICD-10-CM | POA: Diagnosis not present

## 2022-06-10 DIAGNOSIS — Z981 Arthrodesis status: Secondary | ICD-10-CM | POA: Diagnosis not present

## 2022-06-10 DIAGNOSIS — I2510 Atherosclerotic heart disease of native coronary artery without angina pectoris: Secondary | ICD-10-CM | POA: Diagnosis not present

## 2022-06-10 DIAGNOSIS — I10 Essential (primary) hypertension: Secondary | ICD-10-CM | POA: Diagnosis not present

## 2022-06-10 DIAGNOSIS — R000 Tachycardia, unspecified: Secondary | ICD-10-CM | POA: Diagnosis not present

## 2022-06-10 DIAGNOSIS — N179 Acute kidney failure, unspecified: Secondary | ICD-10-CM | POA: Diagnosis not present

## 2022-06-10 DIAGNOSIS — K567 Ileus, unspecified: Secondary | ICD-10-CM | POA: Diagnosis not present

## 2022-06-10 HISTORY — DX: Chronic obstructive pulmonary disease, unspecified: J44.9

## 2022-06-10 LAB — COMPREHENSIVE METABOLIC PANEL
ALT: 28 U/L (ref 0–44)
AST: 28 U/L (ref 15–41)
Albumin: 3.4 g/dL — ABNORMAL LOW (ref 3.5–5.0)
Alkaline Phosphatase: 69 U/L (ref 38–126)
Anion gap: 9 (ref 5–15)
BUN: 13 mg/dL (ref 8–23)
CO2: 24 mmol/L (ref 22–32)
Calcium: 9.4 mg/dL (ref 8.9–10.3)
Chloride: 103 mmol/L (ref 98–111)
Creatinine, Ser: 1.04 mg/dL (ref 0.61–1.24)
GFR, Estimated: >60 mL/min (ref >60)
Glucose, Bld: 107 mg/dL — ABNORMAL HIGH (ref 70–99)
Potassium: 4.1 mmol/L (ref 3.5–5.1)
Sodium: 136 mmol/L (ref 135–145)
Total Bilirubin: 0.7 mg/dL (ref 0.3–1.2)
Total Protein: 8 g/dL (ref 6.5–8.1)

## 2022-06-10 LAB — CBC
HCT: 42.2 % (ref 39.0–52.0)
Hemoglobin: 14.4 g/dL (ref 13.0–17.0)
MCH: 34.9 pg — ABNORMAL HIGH (ref 26.0–34.0)
MCHC: 34.1 g/dL (ref 30.0–36.0)
MCV: 102.2 fL — ABNORMAL HIGH (ref 80.0–100.0)
Platelets: 211 10*3/uL (ref 150–400)
RBC: 4.13 MIL/uL — ABNORMAL LOW (ref 4.22–5.81)
RDW: 13.1 % (ref 11.5–15.5)
WBC: 8.5 10*3/uL (ref 4.0–10.5)
nRBC: 0 % (ref 0.0–0.2)

## 2022-06-10 MED ORDER — IOHEXOL 300 MG/ML  SOLN
100.0000 mL | Freq: Once | INTRAMUSCULAR | Status: AC | PRN
  Administered 2022-06-10: 100 mL via INTRAVENOUS

## 2022-06-10 NOTE — Anesthesia Preprocedure Evaluation (Addendum)
Anesthesia Evaluation  Patient identified by MRN, date of birth, ID band Patient awake    Reviewed: Allergy & Precautions, NPO status , Patient's Chart, lab work & pertinent test results  History of Anesthesia Complications Negative for: history of anesthetic complications  Airway Mallampati: II  TM Distance: >3 FB Neck ROM: Full    Dental no notable dental hx. (+) Dental Advisory Given   Pulmonary COPD,  COPD inhaler, former smoker,    Pulmonary exam normal        Cardiovascular hypertension, Pt. on medications + Peripheral Vascular Disease  Normal cardiovascular exam  2019 Cath  High left ventricle end diastolic pressure.   Non-obstructive coronary artery disease.   Maximize medical therapy.   Discussed with patient (but may still be drowsy).   Discussed with family:   LCP for CCS3 angina   L dom, large LCX territory. Mild non obs CAD. LAD is a small vessel - no  obstructive disease   RCA small, ND.   LVEDP elevated.      Neuro/Psych Anxiety negative neurological ROS     GI/Hepatic negative GI ROS, Neg liver ROS,   Endo/Other  negative endocrine ROS  Renal/GU negative Renal ROS     Musculoskeletal negative musculoskeletal ROS (+)   Abdominal   Peds  Hematology negative hematology ROS (+)   Anesthesia Other Findings   Reproductive/Obstetrics                            Anesthesia Physical Anesthesia Plan  ASA: 3  Anesthesia Plan: General   Post-op Pain Management: Celebrex PO (pre-op)* and Tylenol PO (pre-op)*   Induction: Intravenous  PONV Risk Score and Plan: 4 or greater and Ondansetron, Dexamethasone, Midazolam and Scopolamine patch - Pre-op  Airway Management Planned: Oral ETT  Additional Equipment:   Intra-op Plan:   Post-operative Plan: Extubation in OR  Informed Consent: I have reviewed the patients History and Physical, chart, labs and discussed  the procedure including the risks, benefits and alternatives for the proposed anesthesia with the patient or authorized representative who has indicated his/her understanding and acceptance.     Dental advisory given  Plan Discussed with: Anesthesiologist and CRNA  Anesthesia Plan Comments:        Anesthesia Quick Evaluation

## 2022-06-10 NOTE — Progress Notes (Signed)
PCP - [name], MD Cardiologist - denies  PPM/ICD - denies Device Orders - n/a Rep Notified - n/a  Chest x-ray - n/a EKG - n/a Stress Test - denies ECHO - denies Cardiac Cath - 03/10/2018  Sleep Study - denies CPAP - n/a  Fasting Blood Sugar - n/a  Blood Thinner Instructions: Plavix - last dose - 06/05/2022 per patient  Aspirin Instructions: Patient was instructed: As of today, STOP taking any Aspirin (unless otherwise instructed by your surgeon) Aleve, Naproxen, Ibuprofen, Motrin, Advil, Goody's, BC's, all herbal medications, fish oil, and all vitamins.  ERAS Protcol - yes, until 04:30 o'clock PRE-SURGERY Ensure or G2- n/a  COVID TEST- n/a  Anesthesia review: no  Patient denies shortness of breath, fever, cough and chest pain at PAT appointment   All instructions explained to the patient, with a verbal understanding of the material. Patient agrees to go over the instructions while at home for a better understanding. Patient also instructed to self quarantine after being tested for COVID-19. The opportunity to ask questions was provided.

## 2022-06-11 ENCOUNTER — Other Ambulatory Visit: Payer: Self-pay

## 2022-06-11 ENCOUNTER — Observation Stay (HOSPITAL_COMMUNITY)
Admission: RE | Admit: 2022-06-11 | Discharge: 2022-06-12 | Disposition: A | Discharge status: Home / Self Care | Payer: Medicare Other | Source: Ambulatory Visit | Attending: General Surgery | Admitting: General Surgery

## 2022-06-11 ENCOUNTER — Encounter (HOSPITAL_COMMUNITY)
Admission: RE | Disposition: A | Discharge status: Home / Self Care | Payer: Self-pay | Source: Ambulatory Visit | Attending: General Surgery

## 2022-06-11 ENCOUNTER — Encounter (HOSPITAL_COMMUNITY): Payer: Self-pay | Admitting: General Surgery

## 2022-06-11 ENCOUNTER — Ambulatory Visit (HOSPITAL_COMMUNITY): Payer: Medicare Other | Admitting: Anesthesiology

## 2022-06-11 ENCOUNTER — Ambulatory Visit (HOSPITAL_BASED_OUTPATIENT_CLINIC_OR_DEPARTMENT_OTHER): Payer: Medicare Other | Admitting: Anesthesiology

## 2022-06-11 DIAGNOSIS — Z87891 Personal history of nicotine dependence: Secondary | ICD-10-CM | POA: Insufficient documentation

## 2022-06-11 DIAGNOSIS — K432 Incisional hernia without obstruction or gangrene: Secondary | ICD-10-CM

## 2022-06-11 DIAGNOSIS — I739 Peripheral vascular disease, unspecified: Secondary | ICD-10-CM | POA: Diagnosis not present

## 2022-06-11 DIAGNOSIS — Z79899 Other long term (current) drug therapy: Secondary | ICD-10-CM | POA: Insufficient documentation

## 2022-06-11 DIAGNOSIS — Z9889 Other specified postprocedural states: Secondary | ICD-10-CM | POA: Insufficient documentation

## 2022-06-11 DIAGNOSIS — J449 Chronic obstructive pulmonary disease, unspecified: Secondary | ICD-10-CM | POA: Diagnosis not present

## 2022-06-11 DIAGNOSIS — Z7902 Long term (current) use of antithrombotics/antiplatelets: Secondary | ICD-10-CM | POA: Insufficient documentation

## 2022-06-11 DIAGNOSIS — I10 Essential (primary) hypertension: Secondary | ICD-10-CM | POA: Diagnosis not present

## 2022-06-11 DIAGNOSIS — Z8719 Personal history of other diseases of the digestive system: Secondary | ICD-10-CM

## 2022-06-11 HISTORY — PX: XI ROBOTIC ASSISTED VENTRAL HERNIA: SHX6789

## 2022-06-11 HISTORY — PX: INSERTION OF MESH: SHX5868

## 2022-06-11 LAB — GLUCOSE, CAPILLARY: Glucose-Capillary: 180 mg/dL — ABNORMAL HIGH (ref 70–99)

## 2022-06-11 SURGERY — REPAIR, HERNIA, VENTRAL, ROBOT-ASSISTED
Anesthesia: General | Site: Abdomen

## 2022-06-11 MED ORDER — LIDOCAINE 2% (20 MG/ML) 5 ML SYRINGE
INTRAMUSCULAR | Status: DC | PRN
  Administered 2022-06-11: 100 mg via INTRAVENOUS

## 2022-06-11 MED ORDER — EPHEDRINE SULFATE-NACL 50-0.9 MG/10ML-% IV SOSY
PREFILLED_SYRINGE | INTRAVENOUS | Status: DC | PRN
  Administered 2022-06-11: 5 mg via INTRAVENOUS

## 2022-06-11 MED ORDER — PROPOFOL 10 MG/ML IV BOLUS
INTRAVENOUS | Status: DC | PRN
  Administered 2022-06-11: 200 mg via INTRAVENOUS

## 2022-06-11 MED ORDER — CHLORHEXIDINE GLUCONATE CLOTH 2 % EX PADS: 6.0000 | MEDICATED_PAD | Freq: Once | CUTANEOUS | Status: DC

## 2022-06-11 MED ORDER — PROMETHAZINE HCL 25 MG/ML IJ SOLN: 6.2500 mg | INTRAMUSCULAR | Status: DC | PRN

## 2022-06-11 MED ORDER — VISTASEAL 10 ML SINGLE DOSE KIT
PACK | CUTANEOUS | Status: DC | PRN
  Administered 2022-06-11: 10 mL via TOPICAL

## 2022-06-11 MED ORDER — LACTATED RINGERS IV SOLN: INTRAVENOUS | Status: DC

## 2022-06-11 MED ORDER — ORAL CARE MOUTH RINSE: 15.0000 mL | Freq: Once | OROMUCOSAL | Status: AC

## 2022-06-11 MED ORDER — SCOPOLAMINE 1 MG/3DAYS TD PT72
MEDICATED_PATCH | TRANSDERMAL | Status: AC
  Administered 2022-06-11: 1.5 mg via TRANSDERMAL
  Filled 2022-06-11: qty 1

## 2022-06-11 MED ORDER — ONDANSETRON HCL 4 MG/2ML IJ SOLN
INTRAMUSCULAR | Status: DC | PRN
  Administered 2022-06-11: 4 mg via INTRAVENOUS

## 2022-06-11 MED ORDER — ACETAMINOPHEN 500 MG PO TABS: 1000.0000 mg | ORAL_TABLET | ORAL | Status: DC

## 2022-06-11 MED ORDER — SODIUM CHLORIDE 0.9 % IV SOLN
INTRAVENOUS | Status: DC | PRN
  Administered 2022-06-11: 40 mL

## 2022-06-11 MED ORDER — LISINOPRIL 20 MG PO TABS
20.0000 mg | ORAL_TABLET | Freq: Every day | ORAL | Status: DC
  Filled 2022-06-11: qty 1

## 2022-06-11 MED ORDER — ALBUTEROL SULFATE (2.5 MG/3ML) 0.083% IN NEBU: 3.0000 mL | INHALATION_SOLUTION | Freq: Four times a day (QID) | RESPIRATORY_TRACT | Status: DC | PRN

## 2022-06-11 MED ORDER — SUGAMMADEX SODIUM 200 MG/2ML IV SOLN
INTRAVENOUS | Status: DC | PRN
  Administered 2022-06-11: 362 mg via INTRAVENOUS

## 2022-06-11 MED ORDER — 0.9 % SODIUM CHLORIDE (POUR BTL) OPTIME
TOPICAL | Status: DC | PRN
  Administered 2022-06-11: 1000 mL

## 2022-06-11 MED ORDER — MIDAZOLAM HCL 2 MG/2ML IJ SOLN
INTRAMUSCULAR | Status: AC
  Filled 2022-06-11: qty 2

## 2022-06-11 MED ORDER — CELECOXIB 200 MG PO CAPS: 200.0000 mg | ORAL_CAPSULE | Freq: Once | ORAL | Status: AC

## 2022-06-11 MED ORDER — FENTANYL CITRATE (PF) 100 MCG/2ML IJ SOLN
INTRAMUSCULAR | Status: AC
  Filled 2022-06-11: qty 2

## 2022-06-11 MED ORDER — ONDANSETRON HCL 4 MG/2ML IJ SOLN: 4.0000 mg | Freq: Four times a day (QID) | INTRAMUSCULAR | Status: DC | PRN

## 2022-06-11 MED ORDER — FENTANYL CITRATE (PF) 250 MCG/5ML IJ SOLN
INTRAMUSCULAR | Status: AC
  Filled 2022-06-11: qty 5

## 2022-06-11 MED ORDER — PANTOPRAZOLE SODIUM 40 MG PO TBEC
40.0000 mg | DELAYED_RELEASE_TABLET | Freq: Every day | ORAL | Status: DC
  Administered 2022-06-11 – 2022-06-12 (×2): 40 mg via ORAL
  Filled 2022-06-11 (×2): qty 1

## 2022-06-11 MED ORDER — AMISULPRIDE (ANTIEMETIC) 5 MG/2ML IV SOLN: 10.0000 mg | Freq: Once | INTRAVENOUS | Status: DC | PRN

## 2022-06-11 MED ORDER — EPHEDRINE 5 MG/ML INJ
INTRAVENOUS | Status: AC
  Filled 2022-06-11: qty 5

## 2022-06-11 MED ORDER — ROCURONIUM BROMIDE 10 MG/ML (PF) SYRINGE
PREFILLED_SYRINGE | INTRAVENOUS | Status: DC | PRN
  Administered 2022-06-11: 100 mg via INTRAVENOUS

## 2022-06-11 MED ORDER — CHLORHEXIDINE GLUCONATE 0.12 % MT SOLN
OROMUCOSAL | Status: AC
  Administered 2022-06-11: 15 mL via OROMUCOSAL
  Filled 2022-06-11: qty 15

## 2022-06-11 MED ORDER — CEFAZOLIN SODIUM-DEXTROSE 2-4 GM/100ML-% IV SOLN
2.0000 g | INTRAVENOUS | Status: AC
  Administered 2022-06-11: 2 g via INTRAVENOUS

## 2022-06-11 MED ORDER — ACETAMINOPHEN 500 MG PO TABS
1000.0000 mg | ORAL_TABLET | Freq: Three times a day (TID) | ORAL | Status: DC
  Administered 2022-06-11 – 2022-06-12 (×3): 1000 mg via ORAL
  Filled 2022-06-11 (×3): qty 2

## 2022-06-11 MED ORDER — HYDROMORPHONE HCL 1 MG/ML IJ SOLN
1.0000 mg | INTRAMUSCULAR | Status: DC | PRN
  Administered 2022-06-11 – 2022-06-12 (×4): 1 mg via INTRAVENOUS
  Filled 2022-06-11 (×4): qty 1

## 2022-06-11 MED ORDER — PRAVASTATIN SODIUM 10 MG PO TABS
20.0000 mg | ORAL_TABLET | Freq: Every day | ORAL | Status: DC
  Administered 2022-06-12: 20 mg via ORAL
  Filled 2022-06-11: qty 2

## 2022-06-11 MED ORDER — BUPIVACAINE HCL 0.25 % IJ SOLN
INTRAMUSCULAR | Status: DC | PRN
  Administered 2022-06-11: 15 mL

## 2022-06-11 MED ORDER — MIDAZOLAM HCL 2 MG/2ML IJ SOLN
INTRAMUSCULAR | Status: DC | PRN
  Administered 2022-06-11: 2 mg via INTRAVENOUS

## 2022-06-11 MED ORDER — CHLORHEXIDINE GLUCONATE 0.12 % MT SOLN: 15.0000 mL | Freq: Once | OROMUCOSAL | Status: AC

## 2022-06-11 MED ORDER — DEXAMETHASONE SODIUM PHOSPHATE 10 MG/ML IJ SOLN
INTRAMUSCULAR | Status: DC | PRN
  Administered 2022-06-11: 10 mg via INTRAVENOUS

## 2022-06-11 MED ORDER — SCOPOLAMINE 1 MG/3DAYS TD PT72SCOPOLAMINE 1 MG/3DAYS
1.0000 | MEDICATED_PATCH | TRANSDERMAL | Status: DC
Start: 2022-06-11 — End: 2022-06-11

## 2022-06-11 MED ORDER — CELECOXIB 200 MG PO CAPS
ORAL_CAPSULE | ORAL | Status: AC
  Administered 2022-06-11: 200 mg via ORAL
  Filled 2022-06-11: qty 1

## 2022-06-11 MED ORDER — PHENYLEPHRINE HCL-NACL 20-0.9 MG/250ML-% IV SOLN
INTRAVENOUS | Status: DC | PRN
  Administered 2022-06-11: 25 ug/min via INTRAVENOUS

## 2022-06-11 MED ORDER — PHENYLEPHRINE 80 MCG/ML (10ML) SYRINGE FOR IV PUSH (FOR BLOOD PRESSURE SUPPORT)
PREFILLED_SYRINGE | INTRAVENOUS | Status: DC | PRN
  Administered 2022-06-11 (×3): 160 ug via INTRAVENOUS

## 2022-06-11 MED ORDER — DEXTROSE-NACL 5-0.9 % IV SOLN: INTRAVENOUS | Status: DC

## 2022-06-11 MED ORDER — BUPIVACAINE LIPOSOME 1.3 % IJ SUSP
INTRAMUSCULAR | Status: AC
  Filled 2022-06-11: qty 20

## 2022-06-11 MED ORDER — BUPIVACAINE HCL (PF) 0.25 % IJ SOLN
INTRAMUSCULAR | Status: AC
Start: 2022-06-11 — End: ?
  Filled 2022-06-11: qty 30

## 2022-06-11 MED ORDER — CEFAZOLIN SODIUM-DEXTROSE 2-4 GM/100ML-% IV SOLN
INTRAVENOUS | Status: AC
  Filled 2022-06-11: qty 100

## 2022-06-11 MED ORDER — TRAMADOL HCL 50 MG PO TABS
50.0000 mg | ORAL_TABLET | Freq: Four times a day (QID) | ORAL | Status: DC | PRN
  Administered 2022-06-12: 50 mg via ORAL
  Filled 2022-06-11: qty 1

## 2022-06-11 MED ORDER — ONDANSETRON 4 MG PO TBDP: 4.0000 mg | ORAL_TABLET | Freq: Four times a day (QID) | ORAL | Status: DC | PRN

## 2022-06-11 MED ORDER — ACETAMINOPHEN 500 MG PO TABS
ORAL_TABLET | ORAL | Status: AC
  Filled 2022-06-11: qty 2

## 2022-06-11 MED ORDER — FENTANYL CITRATE (PF) 100 MCG/2ML IJ SOLN
25.0000 ug | INTRAMUSCULAR | Status: DC | PRN
  Administered 2022-06-11 (×2): 50 ug via INTRAVENOUS

## 2022-06-11 MED ORDER — GABAPENTIN 300 MG PO CAPS
600.0000 mg | ORAL_CAPSULE | Freq: Three times a day (TID) | ORAL | Status: DC
  Administered 2022-06-11 – 2022-06-12 (×3): 600 mg via ORAL
  Filled 2022-06-11 (×3): qty 2

## 2022-06-11 MED ORDER — FENTANYL CITRATE (PF) 250 MCG/5ML IJ SOLN
INTRAMUSCULAR | Status: DC | PRN
  Administered 2022-06-11: 50 ug via INTRAVENOUS
  Administered 2022-06-11: 100 ug via INTRAVENOUS

## 2022-06-11 MED ORDER — PROPOFOL 10 MG/ML IV BOLUS
INTRAVENOUS | Status: AC
  Filled 2022-06-11: qty 20

## 2022-06-11 SURGICAL SUPPLY — 62 items
ADH SKN CLS APL DERMABOND .7 (GAUZE/BANDAGES/DRESSINGS) ×1
APL LAPSCP 35 DL APL RGD (MISCELLANEOUS)
APL LAPSCP 45 DL APL FL B (MISCELLANEOUS) ×1
APL PRP STRL LF DISP 70% ISPRP (MISCELLANEOUS) ×1
APPLICATOR VISTASEAL 35 (MISCELLANEOUS) IMPLANT
APPLICATOR VISTASEAL FLEXIBLE (MISCELLANEOUS) ×1 IMPLANT
BAG COUNTER SPONGE SURGICOUNT (BAG) IMPLANT
BAG SPNG CNTER NS LX DISP (BAG)
BINDER ABDOMINAL 10 UNV 27-48 (MISCELLANEOUS) IMPLANT
BINDER ABDOMINAL 12 ML 46-62 (SOFTGOODS) ×1 IMPLANT
CHLORAPREP W/TINT 26 (MISCELLANEOUS) ×3 IMPLANT
COVER MAYO STAND STRL (DRAPES) ×3 IMPLANT
COVER SURGICAL LIGHT HANDLE (MISCELLANEOUS) ×3 IMPLANT
COVER TIP SHEARS 8 DVNC (MISCELLANEOUS) ×2 IMPLANT
COVER TIP SHEARS 8MM DA VINCI (MISCELLANEOUS) ×2
DEFOGGER SCOPE WARMER CLEARIFY (MISCELLANEOUS) ×3 IMPLANT
DERMABOND ADVANCED (GAUZE/BANDAGES/DRESSINGS) ×1
DERMABOND ADVANCED .7 DNX12 (GAUZE/BANDAGES/DRESSINGS) ×2 IMPLANT
DEVICE SECURE STRAP 25 ABSORB (INSTRUMENTS) IMPLANT
DEVICE TROCAR PUNCTURE CLOSURE (ENDOMECHANICALS) ×3 IMPLANT
DRAPE ARM DVNC X/XI (DISPOSABLE) ×8 IMPLANT
DRAPE COLUMN DVNC XI (DISPOSABLE) ×2 IMPLANT
DRAPE CV SPLIT W-CLR ANES SCRN (DRAPES) ×3 IMPLANT
DRAPE DA VINCI XI ARM (DISPOSABLE) ×8
DRAPE DA VINCI XI COLUMN (DISPOSABLE) ×2
DRAPE ORTHO SPLIT 77X108 STRL (DRAPES) ×2
DRAPE SURG ORHT 6 SPLT 77X108 (DRAPES) ×2 IMPLANT
GLOVE BIO SURGEON STRL SZ7.5 (GLOVE) ×6 IMPLANT
GLOVE PROTEXIS LATEX SZ 7.5 (GLOVE) ×4 IMPLANT
GLOVE SURG LATEX 7.5 PF (GLOVE) ×4 IMPLANT
GOWN STRL REUS W/ TWL LRG LVL3 (GOWN DISPOSABLE) ×4 IMPLANT
GOWN STRL REUS W/ TWL XL LVL3 (GOWN DISPOSABLE) ×4 IMPLANT
GOWN STRL REUS W/TWL 2XL LVL3 (GOWN DISPOSABLE) ×3 IMPLANT
GOWN STRL REUS W/TWL LRG LVL3 (GOWN DISPOSABLE) ×4
GOWN STRL REUS W/TWL XL LVL3 (GOWN DISPOSABLE) ×4
IRRIG SUCT STRYKERFLOW 2 WTIP (MISCELLANEOUS)
IRRIGATION SUCT STRKRFLW 2 WTP (MISCELLANEOUS) IMPLANT
KIT BASIN OR (CUSTOM PROCEDURE TRAY) ×3 IMPLANT
KIT TURNOVER KIT B (KITS) ×3 IMPLANT
MARKER SKIN DUAL TIP RULER LAB (MISCELLANEOUS) ×3 IMPLANT
MESH PROLENE PML 12X12 (Mesh General) ×1 IMPLANT
NEEDLE HYPO 22GX1.5 SAFETY (NEEDLE) ×3 IMPLANT
PAD ARMBOARD 7.5X6 YLW CONV (MISCELLANEOUS) ×6 IMPLANT
PENCIL SMOKE EVACUATOR (MISCELLANEOUS) IMPLANT
SCISSORS LAP 5X35 DISP (ENDOMECHANICALS) ×1 IMPLANT
SEAL CANN UNIV 5-8 DVNC XI (MISCELLANEOUS) ×6 IMPLANT
SEAL XI 5MM-8MM UNIVERSAL (MISCELLANEOUS) ×12
SET TUBE SMOKE EVAC HIGH FLOW (TUBING) ×3 IMPLANT
SPIKE FLUID TRANSFER (MISCELLANEOUS) ×3 IMPLANT
STOPCOCK 4 WAY LG BORE MALE ST (IV SETS) ×4 IMPLANT
SUT DVC VLOC 180 0 12IN GS21 (SUTURE) ×6
SUT MNCRL AB 4-0 PS2 18 (SUTURE) ×3 IMPLANT
SUT V-LOC 180 0-0 GS22 (SUTURE) ×1 IMPLANT
SUT VICRYL 0 27 CT2 27 ABS (SUTURE) ×1 IMPLANT
SUT VLOC 180 0 9IN  GS21 (SUTURE) ×8
SUT VLOC 180 0 9IN GS21 (SUTURE) IMPLANT
SUT VLOC 180 2-0 6IN GS21 (SUTURE) ×2 IMPLANT
SUT VLOC 180 2-0 9IN GS21 (SUTURE) IMPLANT
SUTURE DVC VLC 180 0 12IN GS21 (SUTURE) IMPLANT
TOWEL GREEN STERILE FF (TOWEL DISPOSABLE) ×3 IMPLANT
TRAY LAPAROSCOPIC MC (CUSTOM PROCEDURE TRAY) ×3 IMPLANT
TROCAR XCEL NON-BLD 5MMX100MML (ENDOMECHANICALS) ×1 IMPLANT

## 2022-06-11 NOTE — Interval H&P Note (Signed)
History and Physical Interval Note:  06/11/2022 6:57 AM  David King  has presented today for surgery, with the diagnosis of INCISIONAL HERNIA.  The various methods of treatment have been discussed with the patient and family. After consideration of risks, benefits and other options for treatment, the patient has consented to  Procedure(s): ROBOTIC [place] (N/A) as a surgical intervention.  The patient's history has been reviewed, patient examined, no change in status, stable for surgery.  I have reviewed the patient's chart and labs.  Questions were answered to the patient's satisfaction.     Ralene Ok

## 2022-06-11 NOTE — Op Note (Signed)
06/11/2022  10:18 AM  PATIENT:  David King  66 y.o. male  PRE-OPERATIVE DIAGNOSIS:  INCISIONAL HERNIA  POST-OPERATIVE DIAGNOSIS:  INCISIONAL HERNIA  PROCEDURE:  Procedure(s): ROBOTIC INCISIONAL HERNIA REPAIR WITH MESH (N/A) INSERTION OF MESH (N/A) Bilateral transversus abdominis release with musculocutaneous advancement flaps bilaterally  SURGEON:  Surgeon(s) and Role:    Ralene Ok, MD - Primary\  ASSISTANTS: Pryor Curia, RNFA  ANESTHESIA:   local, regional, and general  EBL:  minimal   BLOOD ADMINISTERED:none  DRAINS: none   LOCAL MEDICATIONS USED:  BUPIVICAINE  and OTHER exaprel  SPECIMEN:  No Specimen  DISPOSITION OF SPECIMEN:  N/A  COUNTS:  YES  TOURNIQUET:  * No tourniquets in log *  DICTATION: .Dragon Dictation Indication procedure: Patient is a 66 year old male who previously had undergone a robotic prostatectomy with excision via lower midline incision.  Patient subsequently had a large incisional hernia that developed.  Patient was seen counseled and decided to undergo elective incisional hernia repair with mesh.  Findings: Patient had a large lower midline incision.  This was approximately 8 x 12 cm in size.  This was containing small bowel.  A transversus abdominis release was done bilaterally.  A 22 x 30 cm piece of mesh was then placed into the extraperitoneal space.  This was glued in the midline with Vistaseal.  Details of procedure:  After the patient was consented he was taken back to the OR and placed in the supine position with bilateral SCDs in place. He underwent general endotracheal anesthesia. Patient was prepped and draped in standard fashion. Timeout was called all facts verified.  At this time a 5 mm trocar was then used in the left upper quadrant to get into the preperitoneal/retrorectus space.  Once this was seen visually.  It appeared that we were intra-abdominal.  At this time I decided to continue with intra-abdominal  placement of 8 mm trocars.  78m trocar was then placed under the left subcostal margin. Subsequent to this a camera was placed intra-abdominally. There is no injury to any abdominal organs. At this time two 8 mm ports were placed in the left anterior axillary line under direct visualization. At this time the robot was docked.   At this time the right retrorectus fascia was then incised from superior to inferior to direction.  There was some minimal adhesions to the intra-abdominal wall.  There was a fatty hernia that was taken down superiorly.  At this time dissection was taken laterally.  This was done towards the pubic tubercle.  Once I reached the pubic tubercle was able to incise the transversus abdominis muscle from a inferior to superior direction.  I subsequently incised the transverse abdominal muscle fascia superiorly.  I was able to meet in the middle.  This was connected in the plane.  I then proceeded to create a plane laterally by dissecting the transversus abdominis muscle off of the fascia until the area of the right anterior axillary line.  This allowed me to advance the fascia medially.  At this time 8 mm trocars were then placed in the right side of the abdomen.  This was done under direct visualization x3.  At this time I continued the left-sided dissection of the retrorectus space.  This was done from a inferior to superior direction.  I was easily able to get into the space and dissected laterally towards the transversus abdominis muscle.  This was incised from an inferior to superior direction.  I was able  to release the transversus abdominis muscle and proceeded laterally.  This dissection allowed me to medialize the retrorectus fascia medially.  This was without undue tension.  There were some small peritoneal holes that were reapproximate using 0 Vicryl.  The trocar holes were also closed using a 0 Vicryl in the peritoneum.  At this time a 0 V-Loc was then used from a inferior to  superior fashion in a standard running fashion.  This did meet the middle.  This was able to reapproximate the retrorectus fascia without any undue tension.  At this time several 0 V-Loc's were then used in a running fashion reapproximate the anterior fascia.  Several bites of the hernia sac were caught in the closure to help decrease the space.  At this time the robot was undocked.  The area was measured.  This was approximately 30 x 22 cm.  At this time a piece of Prolene mesh was then used and cut to shape.  This was introduced to the preperitoneal space.  This was unrolled and covered from the superior to the inferior direction without any issues.  This also covered the bilateral previous trocar incision sites.  At this time Vistaseal was used to secure the midline portion of the mesh to the midline closure.  At this time we released the pneumoperitoneum.  We were able to visualize the mesh stayed in place.  At this time the trocars were removed.  Exparel was used and injected along the bilateral incision sites.  The trocar sites were then reapproximated using 4 Monocryl in subcuticular fashion.  The skin was dressed Dermabond.  Patient taught the procedure well was taken to the recovery room in stable condition.  PLAN OF CARE: Admit for overnight observation  PATIENT DISPOSITION:  PACU - hemodynamically stable.   Delay start of Pharmacological VTE agent (>24hrs) due to surgical blood loss or risk of bleeding: not applicable

## 2022-06-11 NOTE — Anesthesia Postprocedure Evaluation (Signed)
Anesthesia Post Note  Patient: David King  Procedure(s) Performed: ROBOTIC INCISIONAL HERNIA REPAIR WITH MESH (Abdomen) INSERTION OF MESH (Abdomen)     Patient location during evaluation: PACU Anesthesia Type: General Level of consciousness: sedated Pain management: pain level controlled Vital Signs Assessment: post-procedure vital signs reviewed and stable Respiratory status: spontaneous breathing and respiratory function stable Cardiovascular status: stable Postop Assessment: no apparent nausea or vomiting Anesthetic complications: no   No notable events documented.  Last Vitals:  Vitals:   06/11/22 1122 06/11/22 1157  BP: 97/72 (!) 169/141  Pulse: 81 67  Resp: 18 18  Temp:  36.6 C  SpO2: 92% 92%    Last Pain:  Vitals:   06/11/22 1157  TempSrc: Oral  PainSc:                  [name]

## 2022-06-11 NOTE — Anesthesia Procedure Notes (Signed)
Procedure Name: Intubation Date/Time: 06/11/2022 7:35 AM  Performed by: Minerva Ends, CRNAPre-anesthesia Checklist: Patient identified, Emergency Drugs available, Suction available and Patient being monitored Patient Re-evaluated:Patient Re-evaluated prior to induction Oxygen Delivery Method: Circle system utilized Preoxygenation: Pre-oxygenation with 100% oxygen Induction Type: IV induction Ventilation: Mask ventilation without difficulty Tube type: Oral Tube size: 7.0 mm Number of attempts: 1 Airway Equipment and Method: Stylet and Oral airway Placement Confirmation: ETT inserted through vocal cords under direct vision, positive ETCO2 and breath sounds checked- equal and bilateral Secured at: 23 cm Tube secured with: Tape Dental Injury: Teeth and Oropharynx as per pre-operative assessment

## 2022-06-11 NOTE — Discharge Instructions (Signed)
[facility] _______[facility]  HERNIA REPAIR: POST OP INSTRUCTIONS  Always review your discharge instruction sheet given to you by the facility where your surgery was performed. IF YOU HAVE DISABILITY OR FAMILY LEAVE FORMS, YOU MUST BRING THEM TO THE OFFICE FOR PROCESSING.   DO NOT GIVE THEM TO YOUR DOCTOR.  1. A  prescription for pain medication may be given to you upon discharge.  Take your pain medication as prescribed, if needed.  If narcotic pain medicine is not needed, then you may take acetaminophen (Tylenol) or ibuprofen (Advil) as needed. 2. Take your usually prescribed medications unless otherwise directed. If you need a refill on your pain medication, please contact your pharmacy.  They will contact our office to request authorization. Prescriptions will not be filled after 5 pm or on week-ends. 3. You should follow a light diet the first 24 hours after arrival home, such as soup and crackers, etc.  Be sure to include lots of fluids daily.  Resume your normal diet the day after surgery. 4.Most patients will experience some swelling and bruising around the umbilicus or in the groin and scrotum.  Ice packs and reclining will help.  Swelling and bruising can take several days to resolve.  6. It is common to experience some constipation if taking pain medication after surgery.  Increasing fluid intake and taking a stool softener (such as Colace) will usually help or prevent this problem from occurring.  A mild laxative (Milk of Magnesia or Miralax) should be taken according to package directions if there are no bowel movements after 48 hours. 7. Unless discharge instructions indicate otherwise, you may remove your bandages 24-48 hours after surgery, and you may shower at that time.  You may have steri-strips (small skin tapes) in place directly over the incision.  These strips should be left on the skin for 7-10 days.  If your surgeon used skin glue on the incision, you may shower in  24 hours.  The glue will flake off over the next 2-3 weeks.  Any sutures or staples will be removed at the office during your follow-up visit. 8. ACTIVITIES:  You may resume regular (light) daily activities beginning the next day--such as daily self-care, walking, climbing stairs--gradually increasing activities as tolerated.  You may have sexual intercourse when it is comfortable.  Refrain from any heavy lifting or straining until approved by your doctor.  a.You may drive when you are no longer taking prescription pain medication, you can comfortably wear a seatbelt, and you can safely maneuver your car and apply brakes. b.RETURN TO WORK:   _____________________________________________  9.You should see your doctor in the office for a follow-up appointment approximately 2-3 weeks after your surgery.  Make sure that you call for this appointment within a day or two after you arrive home to insure a convenient appointment time. 10.OTHER INSTRUCTIONS: _________________________    _____________________________________  WHEN TO CALL YOUR DOCTOR: Fever over 101.0 Inability to urinate Nausea and/or vomiting Extreme swelling or bruising Continued bleeding from incision. Increased pain, redness, or drainage from the incision  The clinic staff is available to answer your questions during regular business hours.  Please don't hesitate to call and ask to speak to one of the nurses for clinical concerns.  If you have a medical emergency, go to the nearest emergency room or call 911.  A surgeon from [facility] is always on call at the hospital   [address], [address], [place], [place]    [address] ?  [address] [address], [place], [place]   [address] [phone] ? [phone] ? FAX [phone] Web site: [email or web address]  

## 2022-06-11 NOTE — Transfer of Care (Signed)
Immediate Anesthesia Transfer of Care Note  Patient: David King  Procedure(s) Performed: ROBOTIC INCISIONAL HERNIA REPAIR WITH MESH (Abdomen) INSERTION OF MESH (Abdomen)  Patient Location: PACU  Anesthesia Type:General  Level of Consciousness: awake, alert  and oriented  Airway & Oxygen Therapy: Patient Spontanous Breathing  Post-op Assessment: Report given to RN and Post -op Vital signs reviewed and stable  Post vital signs: Reviewed and stable  Last Vitals:  Vitals Value Taken Time  BP 130/68 06/11/22 1037  Temp    Pulse 96 06/11/22 1037  Resp 18 06/11/22 1037  SpO2 88 % 06/11/22 1037  Vitals shown include unvalidated device data.  Last Pain:  Vitals:   06/11/22 0609  TempSrc: Oral  PainSc: 0-No pain         Complications: No notable events documented.

## 2022-06-12 ENCOUNTER — Encounter (HOSPITAL_COMMUNITY): Payer: Self-pay | Admitting: General Surgery

## 2022-06-12 MED ORDER — TRAMADOL HCL 50 MG PO TABS: 50.0000 mg | ORAL_TABLET | Freq: Four times a day (QID) | ORAL | 0 refills | Status: DC | PRN

## 2022-06-12 NOTE — Discharge Summary (Signed)
Physician Discharge Summary  Patient ID: David King MRN: [id number] DOB/AGE: 68-Dec-1957 68 y.o.  Admit date: 06/11/2022 Discharge date: 06/12/2022  Admission Diagnoses: Pt admitted s/p robo hernia repair with mesh  Discharge Diagnoses:  Principal Problem:   S/P hernia repair   Discharged Condition: good  Hospital Course: Pt was admitted after surgery.  Please see op note for details. Pt was admittedt to the floor and was doing well.  Pain was well controlled.  He was ambulating well on his own and voiding.  He was tol PO .  He was stable for Dc and Dc'd home.  Consults: None  Significant Diagnostic Studies: none  Treatments: surgery: as above  Discharge Exam: Blood pressure 98/65, pulse 76, temperature 98.7 F (37.1 C), temperature source Oral, resp. rate 16, height '5\' 9"'$  (1.753 m), weight 90.5 kg, SpO2 91 %. General appearance: alert and cooperative GI: soft, non-tender; bowel sounds normal; no masses,  no organomegaly and inc c/d/i  Disposition: Discharge disposition: 01-Home or Self Care       Discharge Instructions     Diet - low sodium heart healthy   Complete by: As directed    Increase activity slowly   Complete by: As directed       Allergies as of 06/12/2022   No Known Allergies      Medication List     TAKE these medications    acetaminophen 500 MG tablet Commonly known as: TYLENOL Take 1,000 mg by mouth 3 (three) times daily.   albuterol 108 (90 Base) MCG/ACT inhaler Commonly known as: VENTOLIN HFA USE 1-2 PUFFS EVERY 6 HOURS AS NEEDED FOR WHEEZING OR SHORTNESS OF BREATH. What changed: See the new instructions.   clobetasol cream 0.05 % Commonly known as: TEMOVATE 1 application. daily.   clopidogrel 75 MG tablet Commonly known as: PLAVIX Take 75 mg by mouth daily.   gabapentin 300 MG capsule Commonly known as: NEURONTIN Take 600 mg by mouth 3 (three) times daily.   lisinopril 20 MG tablet Commonly known as: ZESTRIL Take 20 mg  by mouth daily.   omeprazole 20 MG capsule Commonly known as: PRILOSEC Take 20 mg by mouth daily.   pravastatin 20 MG tablet Commonly known as: PRAVACHOL Take 20 mg by mouth daily.   traMADol 50 MG tablet Commonly known as: Ultram Take 1 tablet (50 mg total) by mouth every 6 (six) hours as needed.   Trelegy Ellipta 100-62.5-25 MCG/ACT Aepb Generic drug: Fluticasone-Umeclidin-Vilant Inhale 1 puff into the lungs daily.   Tylenol PM Extra Strength 25-500 MG Tabs tablet Generic drug: diphenhydramine-acetaminophen Take 3-4 tablets by mouth at bedtime.        Follow-up Information     Ralene Ok, MD. Schedule an appointment as soon as possible for a visit in 1 month(s).   Specialty: General Surgery Contact information: [address] [place] [address] [phone]                 Signed: Ralene Ok 06/12/2022, 9:53 AM

## 2022-06-12 NOTE — Plan of Care (Signed)
  Problem: Education: Goal: Knowledge of General Education information will improve Description: Including pain rating scale, medication(s)/side effects and non-pharmacologic comfort measures 06/12/2022 1023 by Santa Lighter, RN Outcome: Adequate for Discharge 06/12/2022 1022 by Santa Lighter, RN Outcome: Progressing   Problem: Health Behavior/Discharge Planning: Goal: Ability to manage health-related needs will improve 06/12/2022 1023 by Santa Lighter, RN Outcome: Adequate for Discharge 06/12/2022 1022 by Santa Lighter, RN Outcome: Progressing   Problem: Clinical Measurements: Goal: Ability to maintain clinical measurements within normal limits will improve 06/12/2022 1023 by Santa Lighter, RN Outcome: Adequate for Discharge 06/12/2022 1022 by Santa Lighter, RN Outcome: Progressing Goal: Will remain free from infection Outcome: Adequate for Discharge Goal: Diagnostic test results will improve Outcome: Adequate for Discharge Goal: Respiratory complications will improve Outcome: Adequate for Discharge Goal: Cardiovascular complication will be avoided Outcome: Adequate for Discharge   Problem: Nutrition: Goal: Adequate nutrition will be maintained Outcome: Adequate for Discharge   Problem: Coping: Goal: Level of anxiety will decrease Outcome: Adequate for Discharge   Problem: Elimination: Goal: Will not experience complications related to bowel motility Outcome: Adequate for Discharge Goal: Will not experience complications related to urinary retention Outcome: Adequate for Discharge   Problem: Pain Managment: Goal: General experience of comfort will improve Outcome: Adequate for Discharge   Problem: Safety: Goal: Ability to remain free from injury will improve Outcome: Adequate for Discharge   Problem: Skin Integrity: Goal: Risk for impaired skin integrity will decrease Outcome: Adequate for Discharge

## 2022-06-12 NOTE — Plan of Care (Signed)

## 2022-06-12 NOTE — Progress Notes (Signed)
Nsg Discharge Note  Admit Date:  06/11/2022 Discharge date: 06/12/2022   Loletha Carrow to be D/C'd Home per MD order.  AVS completed.   Removed IV-CDI. Reviewed d/c paperwork with patient and wife. Answered all questions. Wheeled stable patient and belongings to main entrance where he was picked up by his wife to d/c to home. Patient/caregiver able to verbalize understanding.  Discharge Medication: Allergies as of 06/12/2022   No Known Allergies      Medication List     TAKE these medications    acetaminophen 500 MG tablet Commonly known as: TYLENOL Take 1,000 mg by mouth 3 (three) times daily.   albuterol 108 (90 Base) MCG/ACT inhaler Commonly known as: VENTOLIN HFA USE 1-2 PUFFS EVERY 6 HOURS AS NEEDED FOR WHEEZING OR SHORTNESS OF BREATH. What changed: See the new instructions.   clobetasol cream 0.05 % Commonly known as: TEMOVATE 1 application. daily.   clopidogrel 75 MG tablet Commonly known as: PLAVIX Take 75 mg by mouth daily.   gabapentin 300 MG capsule Commonly known as: NEURONTIN Take 600 mg by mouth 3 (three) times daily.   lisinopril 20 MG tablet Commonly known as: ZESTRIL Take 20 mg by mouth daily.   omeprazole 20 MG capsule Commonly known as: PRILOSEC Take 20 mg by mouth daily.   pravastatin 20 MG tablet Commonly known as: PRAVACHOL Take 20 mg by mouth daily.   traMADol 50 MG tablet Commonly known as: Ultram Take 1 tablet (50 mg total) by mouth every 6 (six) hours as needed.   Trelegy Ellipta 100-62.5-25 MCG/ACT Aepb Generic drug: Fluticasone-Umeclidin-Vilant Inhale 1 puff into the lungs daily.   Tylenol PM Extra Strength 25-500 MG Tabs tablet Generic drug: diphenhydramine-acetaminophen Take 3-4 tablets by mouth at bedtime.        Discharge Assessment: Vitals:   06/12/22 0428 06/12/22 0742  BP: 101/65 98/65  Pulse: 73 76  Resp: 18 16  Temp: 97.9 F (36.6 C) 98.7 F (37.1 C)  SpO2: 92% 91%   Skin clean, dry and intact without  evidence of skin break down, no evidence of skin tears noted. IV catheter discontinued intact. Site without signs and symptoms of complications - no redness or edema noted at insertion site, patient denies c/o pain - only slight tenderness at site.  Dressing with slight pressure applied.  D/c Instructions-Education: Discharge instructions given to patient/family with verbalized understanding. D/c education completed with patient/family including follow up instructions, medication list, d/c activities limitations if indicated, with other d/c instructions as indicated by MD - patient able to verbalize understanding, all questions fully answered. Patient instructed to return to ED, call 911, or call MD for any changes in condition.  Patient escorted via [place], and D/C home via private auto.  Santa Lighter, RN 06/12/2022 11:21 AM

## 2022-06-13 ENCOUNTER — Inpatient Hospital Stay (HOSPITAL_COMMUNITY)
Admission: EM | Admit: 2022-06-13 | Discharge: 2022-06-21 | DRG: 330 | Disposition: A | Discharge status: Home / Self Care | Payer: Medicare Other | Attending: General Surgery | Admitting: General Surgery

## 2022-06-13 ENCOUNTER — Emergency Department (HOSPITAL_COMMUNITY): Payer: Medicare Other

## 2022-06-13 ENCOUNTER — Other Ambulatory Visit: Payer: Self-pay

## 2022-06-13 ENCOUNTER — Encounter (HOSPITAL_COMMUNITY): Payer: Self-pay

## 2022-06-13 DIAGNOSIS — Z8546 Personal history of malignant neoplasm of prostate: Secondary | ICD-10-CM | POA: Diagnosis not present

## 2022-06-13 DIAGNOSIS — K6389 Other specified diseases of intestine: Secondary | ICD-10-CM | POA: Diagnosis not present

## 2022-06-13 DIAGNOSIS — Z4682 Encounter for fitting and adjustment of non-vascular catheter: Secondary | ICD-10-CM | POA: Diagnosis not present

## 2022-06-13 DIAGNOSIS — K460 Unspecified abdominal hernia with obstruction, without gangrene: Secondary | ICD-10-CM | POA: Diagnosis not present

## 2022-06-13 DIAGNOSIS — Y828 Other medical devices associated with adverse incidents: Secondary | ICD-10-CM | POA: Diagnosis present

## 2022-06-13 DIAGNOSIS — Y838 Other surgical procedures as the cause of abnormal reaction of the patient, or of later complication, without mention of misadventure at the time of the procedure: Secondary | ICD-10-CM | POA: Diagnosis present

## 2022-06-13 DIAGNOSIS — K660 Peritoneal adhesions (postprocedural) (postinfection): Secondary | ICD-10-CM | POA: Diagnosis not present

## 2022-06-13 DIAGNOSIS — D72829 Elevated white blood cell count, unspecified: Secondary | ICD-10-CM | POA: Diagnosis present

## 2022-06-13 DIAGNOSIS — M47816 Spondylosis without myelopathy or radiculopathy, lumbar region: Secondary | ICD-10-CM | POA: Diagnosis present

## 2022-06-13 DIAGNOSIS — Z981 Arthrodesis status: Secondary | ICD-10-CM

## 2022-06-13 DIAGNOSIS — N179 Acute kidney failure, unspecified: Secondary | ICD-10-CM | POA: Diagnosis not present

## 2022-06-13 DIAGNOSIS — I2510 Atherosclerotic heart disease of native coronary artery without angina pectoris: Secondary | ICD-10-CM | POA: Diagnosis not present

## 2022-06-13 DIAGNOSIS — K567 Ileus, unspecified: Principal | ICD-10-CM | POA: Diagnosis present

## 2022-06-13 DIAGNOSIS — J449 Chronic obstructive pulmonary disease, unspecified: Secondary | ICD-10-CM | POA: Diagnosis not present

## 2022-06-13 DIAGNOSIS — K760 Fatty (change of) liver, not elsewhere classified: Secondary | ICD-10-CM | POA: Diagnosis not present

## 2022-06-13 DIAGNOSIS — I10 Essential (primary) hypertension: Secondary | ICD-10-CM | POA: Diagnosis present

## 2022-06-13 DIAGNOSIS — J9811 Atelectasis: Secondary | ICD-10-CM | POA: Diagnosis not present

## 2022-06-13 DIAGNOSIS — Z87891 Personal history of nicotine dependence: Secondary | ICD-10-CM

## 2022-06-13 DIAGNOSIS — I739 Peripheral vascular disease, unspecified: Secondary | ICD-10-CM | POA: Diagnosis not present

## 2022-06-13 DIAGNOSIS — K5939 Other megacolon: Secondary | ICD-10-CM | POA: Diagnosis not present

## 2022-06-13 DIAGNOSIS — R000 Tachycardia, unspecified: Secondary | ICD-10-CM | POA: Diagnosis not present

## 2022-06-13 DIAGNOSIS — E876 Hypokalemia: Secondary | ICD-10-CM | POA: Diagnosis not present

## 2022-06-13 DIAGNOSIS — K5989 Other specified functional intestinal disorders: Secondary | ICD-10-CM | POA: Diagnosis not present

## 2022-06-13 DIAGNOSIS — K450 Other specified abdominal hernia with obstruction, without gangrene: Secondary | ICD-10-CM | POA: Diagnosis not present

## 2022-06-13 DIAGNOSIS — E785 Hyperlipidemia, unspecified: Secondary | ICD-10-CM | POA: Diagnosis not present

## 2022-06-13 DIAGNOSIS — K9189 Other postprocedural complications and disorders of digestive system: Secondary | ICD-10-CM | POA: Diagnosis not present

## 2022-06-13 DIAGNOSIS — Z8042 Family history of malignant neoplasm of prostate: Secondary | ICD-10-CM | POA: Diagnosis not present

## 2022-06-13 DIAGNOSIS — N281 Cyst of kidney, acquired: Secondary | ICD-10-CM | POA: Diagnosis not present

## 2022-06-13 DIAGNOSIS — M1990 Unspecified osteoarthritis, unspecified site: Secondary | ICD-10-CM | POA: Diagnosis present

## 2022-06-13 DIAGNOSIS — E860 Dehydration: Secondary | ICD-10-CM | POA: Diagnosis present

## 2022-06-13 DIAGNOSIS — K432 Incisional hernia without obstruction or gangrene: Secondary | ICD-10-CM | POA: Diagnosis present

## 2022-06-13 DIAGNOSIS — K5669 Other partial intestinal obstruction: Secondary | ICD-10-CM | POA: Diagnosis not present

## 2022-06-13 DIAGNOSIS — K56609 Unspecified intestinal obstruction, unspecified as to partial versus complete obstruction: Secondary | ICD-10-CM | POA: Diagnosis not present

## 2022-06-13 DIAGNOSIS — K5730 Diverticulosis of large intestine without perforation or abscess without bleeding: Secondary | ICD-10-CM | POA: Diagnosis not present

## 2022-06-13 LAB — URINALYSIS, ROUTINE W REFLEX MICROSCOPIC
Bacteria, UA: NONE SEEN
Bilirubin Urine: NEGATIVE
Glucose, UA: NEGATIVE mg/dL
Ketones, ur: NEGATIVE mg/dL
Leukocytes,Ua: NEGATIVE
Nitrite: NEGATIVE
Protein, ur: NEGATIVE mg/dL
Specific Gravity, Urine: 1.038 — ABNORMAL HIGH (ref 1.005–1.030)
pH: 5 (ref 5.0–8.0)

## 2022-06-13 LAB — COMPREHENSIVE METABOLIC PANEL
ALT: 38 U/L (ref 0–44)
AST: 43 U/L — ABNORMAL HIGH (ref 15–41)
Albumin: 3.4 g/dL — ABNORMAL LOW (ref 3.5–5.0)
Alkaline Phosphatase: 70 U/L (ref 38–126)
Anion gap: 16 — ABNORMAL HIGH (ref 5–15)
BUN: 18 mg/dL (ref 8–23)
CO2: 22 mmol/L (ref 22–32)
Calcium: 9.5 mg/dL (ref 8.9–10.3)
Chloride: 95 mmol/L — ABNORMAL LOW (ref 98–111)
Creatinine, Ser: 1.53 mg/dL — ABNORMAL HIGH (ref 0.61–1.24)
GFR, Estimated: 50 mL/min — ABNORMAL LOW (ref >60)
Glucose, Bld: 168 mg/dL — ABNORMAL HIGH (ref 70–99)
Potassium: 4.5 mmol/L (ref 3.5–5.1)
Sodium: 133 mmol/L — ABNORMAL LOW (ref 135–145)
Total Bilirubin: 1.8 mg/dL — ABNORMAL HIGH (ref 0.3–1.2)
Total Protein: 7.9 g/dL (ref 6.5–8.1)

## 2022-06-13 LAB — CBC WITH DIFFERENTIAL/PLATELET
Abs Immature Granulocytes: 0.25 10*3/uL — ABNORMAL HIGH (ref 0.00–0.07)
Basophils Absolute: 0.1 10*3/uL (ref 0.0–0.1)
Basophils Relative: 0 %
Eosinophils Absolute: 0.1 10*3/uL (ref 0.0–0.5)
Eosinophils Relative: 1 %
HCT: 44.4 % (ref 39.0–52.0)
Hemoglobin: 15.2 g/dL (ref 13.0–17.0)
Immature Granulocytes: 2 %
Lymphocytes Relative: 8 %
Lymphs Abs: 1.3 10*3/uL (ref 0.7–4.0)
MCH: 34.6 pg — ABNORMAL HIGH (ref 26.0–34.0)
MCHC: 34.2 g/dL (ref 30.0–36.0)
MCV: 101.1 fL — ABNORMAL HIGH (ref 80.0–100.0)
Monocytes Absolute: 1 10*3/uL (ref 0.1–1.0)
Monocytes Relative: 6 %
Neutro Abs: 13.6 10*3/uL — ABNORMAL HIGH (ref 1.7–7.7)
Neutrophils Relative %: 83 %
Platelets: 309 10*3/uL (ref 150–400)
RBC: 4.39 MIL/uL (ref 4.22–5.81)
RDW: 13.2 % (ref 11.5–15.5)
WBC: 16.2 10*3/uL — ABNORMAL HIGH (ref 4.0–10.5)
nRBC: 0 % (ref 0.0–0.2)

## 2022-06-13 LAB — PROTIME-INR
INR: 1.1 (ref 0.8–1.2)
Prothrombin Time: 14.1 seconds (ref 11.4–15.2)

## 2022-06-13 LAB — LACTIC ACID, PLASMA
Lactic Acid, Venous: 2.4 mmol/L (ref 0.5–1.9)
Lactic Acid, Venous: 1.9 mmol/L (ref 0.5–1.9)

## 2022-06-13 LAB — TROPONIN I (HIGH SENSITIVITY): Troponin I (High Sensitivity): 7 ng/L (ref <18)

## 2022-06-13 MED ORDER — OXYCODONE HCL 5 MG PO TABS
5.0000 mg | ORAL_TABLET | ORAL | Status: DC | PRN
  Administered 2022-06-20 (×3): 10 mg via ORAL
  Filled 2022-06-13 (×2): qty 2

## 2022-06-13 MED ORDER — IOHEXOL 300 MG/ML  SOLN
100.0000 mL | Freq: Once | INTRAMUSCULAR | Status: AC | PRN
  Administered 2022-06-13: 100 mL via INTRAVENOUS

## 2022-06-13 MED ORDER — SODIUM CHLORIDE 0.9 % IV BOLUS
1000.0000 mL | Freq: Once | INTRAVENOUS | Status: AC
  Administered 2022-06-13: 1000 mL via INTRAVENOUS

## 2022-06-13 MED ORDER — HEPARIN SODIUM (PORCINE) 5000 UNIT/ML IJ SOLN
5000.0000 [IU] | Freq: Three times a day (TID) | INTRAMUSCULAR | Status: DC
  Administered 2022-06-14 – 2022-06-15 (×4): 5000 [IU] via SUBCUTANEOUS
  Filled 2022-06-13 (×4): qty 1

## 2022-06-13 MED ORDER — PIPERACILLIN-TAZOBACTAM 3.375 G IVPB 30 MIN
3.3750 g | Freq: Once | INTRAVENOUS | Status: AC
  Administered 2022-06-13: 3.375 g via INTRAVENOUS
  Filled 2022-06-13: qty 50

## 2022-06-13 MED ORDER — FLUTICASONE FUROATE-VILANTEROL 100-25 MCG/ACT IN AEPB
1.0000 | INHALATION_SPRAY | Freq: Every day | RESPIRATORY_TRACT | Status: DC
  Administered 2022-06-14 – 2022-06-21 (×8): 1 via RESPIRATORY_TRACT
  Filled 2022-06-13: qty 28

## 2022-06-13 MED ORDER — PANTOPRAZOLE SODIUM 40 MG IV SOLR
40.0000 mg | Freq: Every day | INTRAVENOUS | Status: DC
  Administered 2022-06-14 – 2022-06-19 (×5): 40 mg via INTRAVENOUS
  Filled 2022-06-13 (×5): qty 10

## 2022-06-13 MED ORDER — UMECLIDINIUM BROMIDE 62.5 MCG/ACT IN AEPB
1.0000 | INHALATION_SPRAY | Freq: Every day | RESPIRATORY_TRACT | Status: DC
  Administered 2022-06-14 – 2022-06-21 (×8): 1 via RESPIRATORY_TRACT
  Filled 2022-06-13: qty 7

## 2022-06-13 MED ORDER — ONDANSETRON HCL 4 MG/2ML IJ SOLN
4.0000 mg | Freq: Once | INTRAMUSCULAR | Status: AC
  Administered 2022-06-13: 4 mg via INTRAVENOUS
  Filled 2022-06-13: qty 2

## 2022-06-13 MED ORDER — SIMETHICONE 80 MG PO CHEW: 40.0000 mg | CHEWABLE_TABLET | Freq: Four times a day (QID) | ORAL | Status: DC | PRN

## 2022-06-13 MED ORDER — METHOCARBAMOL 500 MG PO TABS
500.0000 mg | ORAL_TABLET | Freq: Three times a day (TID) | ORAL | Status: DC | PRN
  Administered 2022-06-14: 500 mg via ORAL
  Filled 2022-06-13: qty 1

## 2022-06-13 MED ORDER — MORPHINE SULFATE (PF) 2 MG/ML IV SOLN
2.0000 mg | INTRAVENOUS | Status: DC | PRN
  Administered 2022-06-14 – 2022-06-18 (×18): 2 mg via INTRAVENOUS
  Filled 2022-06-13 (×19): qty 1

## 2022-06-13 MED ORDER — METHOCARBAMOL 1000 MG/10ML IJ SOLN
500.0000 mg | Freq: Three times a day (TID) | INTRAVENOUS | Status: DC | PRN
  Administered 2022-06-15: 500 mg via INTRAVENOUS
  Filled 2022-06-13: qty 500

## 2022-06-13 MED ORDER — ALBUTEROL SULFATE (2.5 MG/3ML) 0.083% IN NEBU
2.5000 mg | INHALATION_SOLUTION | Freq: Four times a day (QID) | RESPIRATORY_TRACT | Status: DC | PRN
Start: 2022-06-13 — End: 2022-06-21

## 2022-06-13 MED ORDER — MORPHINE SULFATE (PF) 4 MG/ML IV SOLN
4.0000 mg | Freq: Once | INTRAVENOUS | Status: AC
  Administered 2022-06-13: 4 mg via INTRAVENOUS
  Filled 2022-06-13: qty 1

## 2022-06-13 MED ORDER — ACETAMINOPHEN 500 MG PO TABS
1000.0000 mg | ORAL_TABLET | Freq: Four times a day (QID) | ORAL | Status: DC
  Administered 2022-06-14 – 2022-06-20 (×16): 1000 mg via ORAL
  Filled 2022-06-13 (×22): qty 2

## 2022-06-13 MED ORDER — ONDANSETRON 4 MG PO TBDP: 4.0000 mg | ORAL_TABLET | Freq: Four times a day (QID) | ORAL | Status: DC | PRN

## 2022-06-13 MED ORDER — ONDANSETRON HCL 4 MG/2ML IJ SOLN
4.0000 mg | Freq: Four times a day (QID) | INTRAMUSCULAR | Status: DC | PRN
  Administered 2022-06-14 – 2022-06-17 (×5): 4 mg via INTRAVENOUS
  Filled 2022-06-13 (×6): qty 2

## 2022-06-13 MED ORDER — SODIUM CHLORIDE 0.9 % IV SOLN: INTRAVENOUS | Status: DC

## 2022-06-13 MED ORDER — HYDROMORPHONE HCL 1 MG/ML IJ SOLN
1.0000 mg | Freq: Once | INTRAMUSCULAR | Status: AC
  Administered 2022-06-13: 1 mg via INTRAVENOUS
  Filled 2022-06-13: qty 1

## 2022-06-13 NOTE — H&P (Addendum)
David King is an 66 y.o. male.   Chief Complaint: n/v HPI:65 yom with pmh of copd, pvd who has had robot prostatectomy and lumbar fusion noted a bulge. Was taken to the OR by Dr Rosendo Gros on 6/13 and underwent a robotic TAR.  He states he has not passed flatus or stool since surgery was discharged home following day. Today he has had n/v all day not able to tolerate any po.  He came to ER due to this.  Not voiding much. No fever. Not much pain. Underwent ct that shows possible sbo and postop changes.   Past Medical History:  Diagnosis Date   Anxiety    new dx   Arthritis    lumbar   COPD (chronic obstructive pulmonary disease) (HCC)    Diverticulosis    Elevated PSA    Headache(784.0)    MIGRAINES   Iritis    CHRONIC IN LEFT EYE - SOME VISIAL IMPAIRMENT IN LEFT EYE   Neuromuscular disorder ([place])    left leg/foot,pinched siactic nerve   Pain    PAIN LEFT HIP AND DOWN LT LEG WITH NUMBNESS IN LEFT LEG--PT STATES SCIATIC NERVE IMPINGEMENT - PT PLANS BACK IN THE NEAR SURGERY.   Prostate cancer ([place]) 03/05/2013   Adenocarcinoma   Renal cysts, acquired, bilateral 03/19/2013   several simple , CT   Urinary frequency    AND NOCTURIA    Past Surgical History:  Procedure Laterality Date   BACK SURGERY     CARDIAC CATHETERIZATION     CATARACT EXTRACTION W/ INTRAOCULAR LENS IMPLANT Left    EYE SURGERY     RETINAL SURGERY LEFT EYE   HYDROCELE EXCISION Left 03/05/2013   Procedure: HYDROCELECTOMY ADULT;  Surgeon: Fredricka Bonine, MD;  Location: [facility];  Service: Urology;  Laterality: Left;   INGUINAL HERNIA REPAIR Bilateral AGE 33   INSERTION OF MESH N/A 06/11/2022   Procedure: INSERTION OF MESH;  Surgeon: Ralene Ok, MD;  Location: [facility];  Service: General;  Laterality: N/A;   LYMPHADENECTOMY Bilateral 05/06/2013   Procedure: Noel Journey;  Surgeon: Dutch Gray, MD;  Location: [facility];  Service: Urology;  Laterality: Bilateral;   PROSTATE BIOPSY N/A  03/05/2013   Procedure: PROSTATE BIOPSY and ultrasound;  Surgeon: Fredricka Bonine, MD;  Location: [facility];  Service: Urology;  Laterality: N/A;   REMOVAL BURSA Ramsey, LEFT ELBOW  1996   ROBOT ASSISTED LAPAROSCOPIC RADICAL PROSTATECTOMY N/A 05/06/2013   Procedure: ROBOTIC ASSISTED LAPAROSCOPIC RADICAL PROSTATECTOMY LEVEL 3;  Surgeon: Dutch Gray, MD;  Location: [facility];  Service: Urology;  Laterality: N/A;   TONSILLECTOMY     as achild   XI ROBOTIC ASSISTED VENTRAL HERNIA N/A 06/11/2022   Procedure: ROBOTIC INCISIONAL HERNIA REPAIR WITH MESH;  Surgeon: Ralene Ok, MD;  Location: [place];  Service: General;  Laterality: N/A;    Family History  Problem Relation Age of Onset   Cancer Father        prostate cancer   Cancer Paternal Grandfather        prostate   Social History:  reports that he quit smoking about 5 years ago. His smoking use included cigarettes. He has a 94.00 pack-year smoking history. He has never used smokeless tobacco. He reports current alcohol use of about 14.0 standard drinks of alcohol per week. He reports that he does not use drugs.  Allergies: No Known Allergies  (Not in a hospital admission)   Results for orders placed or performed  during the hospital encounter of 06/13/22 (from the past 48 hour(s))  Comprehensive metabolic panel     Status: Abnormal   Collection Time: 06/13/22  7:50 PM  Result Value Ref Range   Sodium 133 (L) 135 - 145 mmol/L   Potassium 4.5 3.5 - 5.1 mmol/L   Chloride 95 (L) 98 - 111 mmol/L   CO2 22 22 - 32 mmol/L   Glucose, Bld 168 (H) 70 - 99 mg/dL    Comment: Glucose reference range applies only to samples taken after fasting for at least 8 hours.   BUN 18 8 - 23 mg/dL   Creatinine, Ser 1.53 (H) 0.61 - 1.24 mg/dL   Calcium 9.5 8.9 - 10.3 mg/dL   Total Protein 7.9 6.5 - 8.1 g/dL   Albumin 3.4 (L) 3.5 - 5.0 g/dL   AST 43 (H) 15 - 41 U/L   ALT 38 0 - 44 U/L   Alkaline Phosphatase 70 38 - 126 U/L   Total  Bilirubin 1.8 (H) 0.3 - 1.2 mg/dL   GFR, Estimated 50 (L) >60 mL/min    Comment: (NOTE) Calculated using the CKD-EPI Creatinine Equation (2021)    Anion gap 16 (H) 5 - 15    Comment: Performed at [facility], [place] [address] [place], [place] [address]  Lactic acid, plasma     Status: Abnormal   Collection Time: 06/13/22  7:50 PM  Result Value Ref Range   Lactic Acid, Venous 2.4 (HH) 0.5 - 1.9 mmol/L    Comment: CRITICAL RESULT CALLED TO, READ BACK BY AND VERIFIED WITH: K.YUAL,RN '@2035'$  06/13/2022 VANG.J Performed at [facility], [place] [address], [place], [place] [address]   CBC with Differential     Status: Abnormal   Collection Time: 06/13/22  7:50 PM  Result Value Ref Range   WBC 16.2 (H) 4.0 - 10.5 K/uL   RBC 4.39 4.22 - 5.81 MIL/uL   Hemoglobin 15.2 13.0 - 17.0 g/dL   HCT 44.4 39.0 - 52.0 %   MCV 101.1 (H) 80.0 - 100.0 fL   MCH 34.6 (H) 26.0 - 34.0 pg   MCHC 34.2 30.0 - 36.0 g/dL   RDW 13.2 11.5 - 15.5 %   Platelets 309 150 - 400 K/uL   nRBC 0.0 0.0 - 0.2 %   Neutrophils Relative % 83 %   Neutro Abs 13.6 (H) 1.7 - 7.7 K/uL   Lymphocytes Relative 8 %   Lymphs Abs 1.3 0.7 - 4.0 K/uL   Monocytes Relative 6 %   Monocytes Absolute 1.0 0.1 - 1.0 K/uL   Eosinophils Relative 1 %   Eosinophils Absolute 0.1 0.0 - 0.5 K/uL   Basophils Relative 0 %   Basophils Absolute 0.1 0.0 - 0.1 K/uL   Immature Granulocytes 2 %   Abs Immature Granulocytes 0.25 (H) 0.00 - 0.07 K/uL    Comment: Performed at [facility] [address] [place], [place] [address]  Troponin I (High Sensitivity)     Status: None   Collection Time: 06/13/22  7:50 PM  Result Value Ref Range   Troponin I (High Sensitivity) 7 <18 ng/L    Comment: (NOTE) Elevated high sensitivity troponin I (hsTnI) values and significant  changes across serial measurements may suggest ACS but many other  chronic and acute conditions are known to elevate hsTnI results.  Refer to the "Links" section for chest pain  algorithms and additional  guidance. Performed at [facility], [place]  [address], [place], [place] [address]   Lactic acid, plasma     Status: None   Collection Time: 06/13/22  8:55 PM  Result Value Ref Range   Lactic Acid, Venous 1.9 0.5 - 1.9 mmol/L    Comment: Performed at [facility] [address] [place], [place] [address]  Protime-INR     Status: None   Collection Time: 06/13/22  8:55 PM  Result Value Ref Range   Prothrombin Time 14.1 11.4 - 15.2 seconds   INR 1.1 0.8 - 1.2    Comment: (NOTE) INR goal varies based on device and disease states. Performed at [facility], [place] [address], [place], [place] [address]    CT ABDOMEN PELVIS W CONTRAST  Result Date: 06/13/2022 CLINICAL DATA:  Abdominal pain, post-op Bowel obstruction suspected EXAM: CT ABDOMEN AND PELVIS WITH CONTRAST TECHNIQUE: Multidetector CT imaging of the abdomen and pelvis was performed using the standard protocol following bolus administration of intravenous contrast. RADIATION DOSE REDUCTION: This exam was performed according to the departmental dose-optimization program which includes automated exposure control, adjustment of the mA and/or kV according to patient size and/or use of iterative reconstruction technique. CONTRAST:  161m OMNIPAQUE IOHEXOL 300 MG/ML  SOLN COMPARISON:  06/10/2022 FINDINGS: Lower chest: Nodular infiltrate is developed within the visualized left lower lobe, possibly infectious in the acute setting. Mild bibasilar atelectasis. Hepatobiliary: Mild hepatic steatosis. No enhancing intrahepatic mass. No intra or extrahepatic biliary ductal dilation. Gallbladder unremarkable. Pancreas: Unremarkable Spleen: Unremarkable Adrenals/Urinary Tract: The adrenal glands are unremarkable. The kidneys are normal in position. Mild bilateral renal cortical atrophy. There are at least 3 nonobstructing calculi identified within the interpolar region of the left kidney extending into the left  renal pelvis measuring up to 15 mm. There is again noted mild dilation of the left renal pelvis and mild peripelvic infiltration likely related to secondary inflammatory change. No hydronephrosis, however, there is slightly delayed left renal cortical enhancement suggesting at least mild associated obstruction. No renal calculi on the right. No ureteral calculi. Multiple simple cortical cysts are seen within the kidneys bilaterally in keeping with simple renal cysts. No further follow-up is recommended for these lesions. The bladder is unremarkable. Stomach/Bowel: Interval ventral hernia repair with mesh. There is extensive subcutaneous gas within the abdominal wall and within the rectus sheath superiorly, likely postsurgical in nature. A few punctate foci of free intraperitoneal gas or noted inferiorly along with mild free intraperitoneal fluid within the peritoneal eventration within the pannus. There are multiple mildly dilated fluid-filled loops of proximal small bowel extending to a single point of transition within the mid pelvis anteriorly best seen axial image # 71/3 which may represent a partial or developing small bowel obstruction. The distal small bowel is decompressed. Oral contrast from prior CT examination is seen throughout the colon. There is severe descending and sigmoid colonic diverticulosis without superimposed acute inflammatory change. Appendix normal. Vascular/Lymphatic: Extensive aortoiliac atherosclerotic calcification. Prior stenting of the right comminuted and left common and external iliac arteries is noted. Stable 3.1 cm infrarenal abdominal aortic aneurysm no pathologic adenopathy within the abdomen and pelvis. Reproductive: Status post hysterectomy. No adnexal masses. Other: No recurrent abdominal wall hernia.  Rectum unremarkable Musculoskeletal: L5-S1 lumbar fusion with instrumentation has been performed. Degenerative changes are seen within the visualized thoracolumbar spine. No  acute bone abnormality. IMPRESSION: 1. Interval ventral hernia repair with mesh. Extensive subcutaneous gas within the abdominal wall and rectus sheath as well as a few punctate foci of  free intraperitoneal gas and mild free intraperitoneal fluid, likely postsurgical in nature. 2. Multiple mildly dilated fluid-filled loops of proximal small bowel extending to a single point of transition within the mid pelvis anteriorly which may represent a partial or developing small bowel obstruction. Oral contrast noted within the colon represents contrast from the preoperative CT examination. 3. Nonobstructing left nephrolithiasis. Mild dilation of the left renal pelvis and mild peripelvic infiltration likely related to secondary inflammatory change. No hydronephrosis, however, there is slightly delayed left renal cortical enhancement suggesting at least mild associated obstruction. 4. Severe distal colonic diverticulosis without superimposed acute inflammatory change. 5. Stable 3.1 cm infrarenal abdominal aortic aneurysm. Recommend follow-up ultrasound every 3 years. This recommendation follows ACR consensus guidelines: White Paper of the ACR Incidental Findings Committee II on Vascular Findings. J Am Coll Radiol 2013; 10:789-794. 6. Nodular infiltrate within the visualized left lower lobe, possibly infectious in the acute setting. Aortic Atherosclerosis (ICD10-I70.0). Electronically Signed   By: Fidela Salisbury M.D.   On: 06/13/2022 23:02   DG Chest Portable 1 View  Result Date: 06/13/2022 CLINICAL DATA:  Postop EXAM: PORTABLE CHEST 1 VIEW COMPARISON:  05/13/2022 FINDINGS: Linear subsegmental atelectasis in the right mid lung and left lower lung. Heart is normal size. Aortic atherosclerosis. No effusions or acute bony abnormality. IMPRESSION: Areas of atelectasis bilaterally.  No active disease. Electronically Signed   By: Rolm Baptise M.D.   On: 06/13/2022 19:30    Review of Systems  Gastrointestinal:  Positive for  abdominal distention, abdominal pain, constipation, nausea and vomiting.  All other systems reviewed and are negative.   Blood pressure 128/87, pulse (!) 113, temperature 98.4 F (36.9 C), temperature source Oral, resp. rate 20, SpO2 92 %. Physical Exam Constitutional:      Appearance: Normal appearance.  Eyes:     General: No scleral icterus. Cardiovascular:     Rate and Rhythm: Regular rhythm. Tachycardia present.     Pulses: Normal pulses.  Pulmonary:     Effort: Pulmonary effort is normal.     Breath sounds: Normal breath sounds.  Abdominal:     General: There is distension (mild).     Tenderness: There is no abdominal tenderness.     Comments: Well healed incisions, nontender   Musculoskeletal:     Right lower leg: No edema.     Left lower leg: No edema.  Skin:    General: Skin is warm and dry.     Capillary Refill: Capillary refill takes less than 2 seconds.  Neurological:     General: No focal deficit present.     Mental Status: He is alert.  Psychiatric:        Mood and Affect: Mood normal.        Behavior: Behavior normal.      Assessment/Plan  Likely ileus s/p robotic TAR -no current n/v and abdomen is mostly soft, I think reasonable to admit, npo except sips, hydrate him as he is dehydrated with elevated Cr from baseline. -could be sbo related to hernia repair also  -will hold on ng tube for now but if has any additional emesis/nausea will place -plan to repeat xray in am, consider protocol if not better and bowel regimen -pulm toilet, oob  HTN -hold lisinopril for now  Elevated Cr -hydration and recheck in am  SCDs, subq heparin  I reviewed ED provider notes, last 24 h vitals and pain scores, last 24 h labs and trends, and last 24 h imaging results.discussed case with  ER physician. I independently reviewed ct scan as well.   This care required moderate level of medical decision making.    Rolm Bookbinder, MD 06/13/2022, 11:50 PM

## 2022-06-13 NOTE — ED Notes (Signed)
Critical lab lactic acid 2.4, primary Herbie Baltimore RN made aware.

## 2022-06-13 NOTE — ED Triage Notes (Signed)
Patient had hernia surg on Tuesday and this morning woke up vomiting and it smells feces.

## 2022-06-13 NOTE — ED Provider Notes (Signed)
Emergency Department Provider Note   I have reviewed the triage vital signs and the nursing notes.   HISTORY  Chief Complaint Post-op Problem   HPI David King is a 66 y.o. male with past medical history reviewed below currently postop day 2 status post incisional hernia repair with mesh placement.  Patient was discharged yesterday.  He was voiding urine at the time of discharge but states he is yet to have a bowel movement.  He does not recall passing flatus.  He states has been vomiting every 20 to 30 minutes.  No blood in the emesis.  No fevers.  He is having abdominal soreness and lower abdominal pain.  No bleeding or drainage from incision sites. Notes some chest tightness as well.   Past Medical History:  Diagnosis Date   Anxiety    new dx   Arthritis    lumbar   COPD (chronic obstructive pulmonary disease) (HCC)    Diverticulosis    Elevated PSA    Headache(784.0)    MIGRAINES   Iritis    CHRONIC IN LEFT EYE - SOME VISIAL IMPAIRMENT IN LEFT EYE   Neuromuscular disorder ([place])    left leg/foot,pinched siactic nerve   Pain    PAIN LEFT HIP AND DOWN LT LEG WITH NUMBNESS IN LEFT LEG--PT STATES SCIATIC NERVE IMPINGEMENT - PT PLANS BACK IN THE NEAR SURGERY.   Prostate cancer ([place]) 03/05/2013   Adenocarcinoma   Renal cysts, acquired, bilateral 03/19/2013   several simple , CT   Urinary frequency    AND NOCTURIA    Review of Systems  Constitutional: No fever/chills Eyes: No visual changes. ENT: No sore throat. Cardiovascular: Positive chest pain. Respiratory: Denies shortness of breath. Gastrointestinal: Positive abdominal pain. Positive nausea, and vomiting.  No diarrhea.  No constipation. Genitourinary: Negative for dysuria. Musculoskeletal: Negative for back pain. Skin: Negative for rash. Neurological: Negative for headaches, focal weakness or numbness.   ____________________________________________   PHYSICAL EXAM:  VITAL SIGNS: ED Triage Vitals   Enc Vitals Group     BP 06/13/22 1907 (!) 127/103     Pulse Rate 06/13/22 1907 (!) 138     Resp 06/13/22 1907 (!) 26     Temp 06/13/22 1907 98.4 F (36.9 C)     Temp Source 06/13/22 1907 Oral     SpO2 06/13/22 1907 94 %   Constitutional: Alert and oriented. Well appearing and in no acute distress. Eyes: Conjunctivae are normal.  Head: Atraumatic. Nose: No congestion/rhinnorhea. Mouth/Throat: Mucous membranes are dry.  Neck: No stridor.   Cardiovascular: Tachycardia. Good peripheral circulation. Grossly normal heart sounds.   Respiratory: Normal respiratory effort.  No retractions. Lungs CTAB. Gastrointestinal: Soft with mild lower abdominal tenderness. Expected post-op bruising noted to the inguinal creases. Mild distention. No frank peritonitis. Musculoskeletal: No lower extremity tenderness nor edema. No gross deformities of extremities. Neurologic:  Normal speech and language. No gross focal neurologic deficits are appreciated.  Skin:  Skin is warm, dry and intact. No rash noted.  ____________________________________________   LABS (all labs ordered are listed, but only abnormal results are displayed)  Labs Reviewed  COMPREHENSIVE METABOLIC PANEL - Abnormal; Notable for the following components:      Result Value   Sodium 133 (*)    Chloride 95 (*)    Glucose, Bld 168 (*)    Creatinine, Ser 1.53 (*)    Albumin 3.4 (*)    AST 43 (*)    Total Bilirubin 1.8 (*)  GFR, Estimated 50 (*)    Anion gap 16 (*)    All other components within normal limits  LACTIC ACID, PLASMA - Abnormal; Notable for the following components:   Lactic Acid, Venous 2.4 (*)    All other components within normal limits  CBC WITH DIFFERENTIAL/PLATELET - Abnormal; Notable for the following components:   WBC 16.2 (*)    MCV 101.1 (*)    MCH 34.6 (*)    Neutro Abs 13.6 (*)    Abs Immature Granulocytes 0.25 (*)    All other components within normal limits  CULTURE, BLOOD (ROUTINE X 2)  CULTURE,  BLOOD (ROUTINE X 2)  LACTIC ACID, PLASMA  PROTIME-INR  URINALYSIS, ROUTINE W REFLEX MICROSCOPIC  TROPONIN I (HIGH SENSITIVITY)  TROPONIN I (HIGH SENSITIVITY)   ____________________________________________  EKG   EKG Interpretation  Date/Time:  Thursday June 13 2022 19:52:45 EDT Ventricular Rate:  121 PR Interval:  147 QRS Duration: 101 QT Interval:  314 QTC Calculation: 446 R Axis:   68 Text Interpretation: Sinus tachycardia Probable left atrial enlargement Minimal ST depression, lateral leads Artifact in lead(s) I III aVR aVL aVF Confirmed by Nanda Quinton [phone]) on 06/13/2022 7:56:05 PM        ____________________________________________  RADIOLOGY  CT ABDOMEN PELVIS W CONTRAST  Result Date: 06/13/2022 CLINICAL DATA:  Abdominal pain, post-op Bowel obstruction suspected EXAM: CT ABDOMEN AND PELVIS WITH CONTRAST TECHNIQUE: Multidetector CT imaging of the abdomen and pelvis was performed using the standard protocol following bolus administration of intravenous contrast. RADIATION DOSE REDUCTION: This exam was performed according to the departmental dose-optimization program which includes automated exposure control, adjustment of the mA and/or kV according to patient size and/or use of iterative reconstruction technique. CONTRAST:  153m OMNIPAQUE IOHEXOL 300 MG/ML  SOLN COMPARISON:  06/10/2022 FINDINGS: Lower chest: Nodular infiltrate is developed within the visualized left lower lobe, possibly infectious in the acute setting. Mild bibasilar atelectasis. Hepatobiliary: Mild hepatic steatosis. No enhancing intrahepatic mass. No intra or extrahepatic biliary ductal dilation. Gallbladder unremarkable. Pancreas: Unremarkable Spleen: Unremarkable Adrenals/Urinary Tract: The adrenal glands are unremarkable. The kidneys are normal in position. Mild bilateral renal cortical atrophy. There are at least 3 nonobstructing calculi identified within the interpolar region of the left kidney extending  into the left renal pelvis measuring up to 15 mm. There is again noted mild dilation of the left renal pelvis and mild peripelvic infiltration likely related to secondary inflammatory change. No hydronephrosis, however, there is slightly delayed left renal cortical enhancement suggesting at least mild associated obstruction. No renal calculi on the right. No ureteral calculi. Multiple simple cortical cysts are seen within the kidneys bilaterally in keeping with simple renal cysts. No further follow-up is recommended for these lesions. The bladder is unremarkable. Stomach/Bowel: Interval ventral hernia repair with mesh. There is extensive subcutaneous gas within the abdominal wall and within the rectus sheath superiorly, likely postsurgical in nature. A few punctate foci of free intraperitoneal gas or noted inferiorly along with mild free intraperitoneal fluid within the peritoneal eventration within the pannus. There are multiple mildly dilated fluid-filled loops of proximal small bowel extending to a single point of transition within the mid pelvis anteriorly best seen axial image # 71/3 which may represent a partial or developing small bowel obstruction. The distal small bowel is decompressed. Oral contrast from prior CT examination is seen throughout the colon. There is severe descending and sigmoid colonic diverticulosis without superimposed acute inflammatory change. Appendix normal. Vascular/Lymphatic: Extensive aortoiliac atherosclerotic calcification. Prior stenting  of the right comminuted and left common and external iliac arteries is noted. Stable 3.1 cm infrarenal abdominal aortic aneurysm no pathologic adenopathy within the abdomen and pelvis. Reproductive: Status post hysterectomy. No adnexal masses. Other: No recurrent abdominal wall hernia.  Rectum unremarkable Musculoskeletal: L5-S1 lumbar fusion with instrumentation has been performed. Degenerative changes are seen within the visualized  thoracolumbar spine. No acute bone abnormality. IMPRESSION: 1. Interval ventral hernia repair with mesh. Extensive subcutaneous gas within the abdominal wall and rectus sheath as well as a few punctate foci of free intraperitoneal gas and mild free intraperitoneal fluid, likely postsurgical in nature. 2. Multiple mildly dilated fluid-filled loops of proximal small bowel extending to a single point of transition within the mid pelvis anteriorly which may represent a partial or developing small bowel obstruction. Oral contrast noted within the colon represents contrast from the preoperative CT examination. 3. Nonobstructing left nephrolithiasis. Mild dilation of the left renal pelvis and mild peripelvic infiltration likely related to secondary inflammatory change. No hydronephrosis, however, there is slightly delayed left renal cortical enhancement suggesting at least mild associated obstruction. 4. Severe distal colonic diverticulosis without superimposed acute inflammatory change. 5. Stable 3.1 cm infrarenal abdominal aortic aneurysm. Recommend follow-up ultrasound every 3 years. This recommendation follows ACR consensus guidelines: White Paper of the ACR Incidental Findings Committee II on Vascular Findings. J Am Coll Radiol 2013; 10:789-794. 6. Nodular infiltrate within the visualized left lower lobe, possibly infectious in the acute setting. Aortic Atherosclerosis (ICD10-I70.0). Electronically Signed   By: Fidela Salisbury M.D.   On: 06/13/2022 23:02   DG Chest Portable 1 View  Result Date: 06/13/2022 CLINICAL DATA:  Postop EXAM: PORTABLE CHEST 1 VIEW COMPARISON:  05/13/2022 FINDINGS: Linear subsegmental atelectasis in the right mid lung and left lower lung. Heart is normal size. Aortic atherosclerosis. No effusions or acute bony abnormality. IMPRESSION: Areas of atelectasis bilaterally.  No active disease. Electronically Signed   By: Rolm Baptise M.D.   On: 06/13/2022 19:30     ____________________________________________   PROCEDURES  Procedure(s) performed:   Procedures  None  ____________________________________________   INITIAL IMPRESSION / ASSESSMENT AND PLAN / ED COURSE  Pertinent labs & imaging results that were available during my care of the patient were reviewed by me and considered in my medical decision making (see chart for details).   This patient is Presenting for Evaluation of abdominal pain, which does require a range of treatment options, and is a complaint that involves a high risk of morbidity and mortality.  The Differential Diagnoses includes but is not exclusive to acute cholecystitis, intrathoracic causes for epigastric abdominal pain, gastritis, duodenitis, pancreatitis, small bowel or large bowel obstruction, abdominal aortic aneurysm, hernia, gastritis, etc.   Critical Interventions-    Medications  sodium chloride 0.9 % bolus 1,000 mL (0 mLs Intravenous Stopped 06/13/22 2044)  ondansetron (ZOFRAN) injection 4 mg (4 mg Intravenous Given 06/13/22 1959)  morphine (PF) 4 MG/ML injection 4 mg (4 mg Intravenous Given 06/13/22 2000)  piperacillin-tazobactam (ZOSYN) IVPB 3.375 g (0 g Intravenous Stopped 06/13/22 2138)  HYDROmorphone (DILAUDID) injection 1 mg (1 mg Intravenous Given 06/13/22 2111)  iohexol (OMNIPAQUE) 300 MG/ML solution 100 mL (100 mLs Intravenous Contrast Given 06/13/22 2235)    Reassessment after intervention: Pain mildly improved.   I did obtain Additional Historical Information from wife at bedside.  I decided to review pertinent External Data, and in summary patient POD 2 s/p incisional hernia repair with mesh placement with Dr. Rosendo Gros.    Clinical Laboratory  Tests Ordered, included patient found to have leukocytosis of 16.2, mild elevation in creatinine to 1.53.  Lactate of 2.4.  Unclear if this represents underlying infection, less likely postop day 2, versus acute dehydration from vomiting.  Radiologic  Tests Ordered, included CXR. I independently interpreted the images and agree with radiology interpretation.   Cardiac Monitor Tracing which shows sinus tachycardia.   Social Determinants of Health Risk patient is a former smoker.   Consult complete with Surgery, Dr. Donne Hazel. He will be down to evaluate for admit.   Medical Decision Making: Summary:  Patient presents emergency department evaluation of abdominal pain with vomiting.  He is awake and alert.  No peritonitis on exam although distention and tenderness.  Incisions appear as expected on postop day 2.  Differential includes bowel obstruction although ileus is a consideration as well.  Lower suspicion for acute infection although will monitor after IV fluids and pain mgmt.   Reevaluation with update and discussion with patient. No vomiting since my initial evaluation. Discussed CT results and plan for possible admit.   Disposition: discharge  ____________________________________________  FINAL CLINICAL IMPRESSION(S) / ED DIAGNOSES  Final diagnoses:  Small bowel obstruction ([place])    Note:  This document was prepared using Dragon voice recognition software and may include unintentional dictation errors.  Nanda Quinton, MD, [facility]    Keirsten Matuska, [facility], MD 06/13/22 [phone]

## 2022-06-14 ENCOUNTER — Inpatient Hospital Stay (HOSPITAL_COMMUNITY): Payer: Medicare Other

## 2022-06-14 LAB — CBC
HCT: 38.2 % — ABNORMAL LOW (ref 39.0–52.0)
Hemoglobin: 12.8 g/dL — ABNORMAL LOW (ref 13.0–17.0)
MCH: 33.8 pg (ref 26.0–34.0)
MCHC: 33.5 g/dL (ref 30.0–36.0)
MCV: 100.8 fL — ABNORMAL HIGH (ref 80.0–100.0)
Platelets: 246 10*3/uL (ref 150–400)
RBC: 3.79 MIL/uL — ABNORMAL LOW (ref 4.22–5.81)
RDW: 13.4 % (ref 11.5–15.5)
WBC: 13 10*3/uL — ABNORMAL HIGH (ref 4.0–10.5)
nRBC: 0 % (ref 0.0–0.2)

## 2022-06-14 LAB — TROPONIN I (HIGH SENSITIVITY): Troponin I (High Sensitivity): 6 ng/L (ref <18)

## 2022-06-14 LAB — BASIC METABOLIC PANEL
Anion gap: 10 (ref 5–15)
BUN: 18 mg/dL (ref 8–23)
CO2: 22 mmol/L (ref 22–32)
Calcium: 8.4 mg/dL — ABNORMAL LOW (ref 8.9–10.3)
Chloride: 102 mmol/L (ref 98–111)
Creatinine, Ser: 1.3 mg/dL — ABNORMAL HIGH (ref 0.61–1.24)
GFR, Estimated: >60 mL/min (ref >60)
Glucose, Bld: 125 mg/dL — ABNORMAL HIGH (ref 70–99)
Potassium: 3.9 mmol/L (ref 3.5–5.1)
Sodium: 134 mmol/L — ABNORMAL LOW (ref 135–145)

## 2022-06-14 MED ORDER — KETOROLAC TROMETHAMINE 30 MG/ML IJ SOLN
30.0000 mg | Freq: Four times a day (QID) | INTRAMUSCULAR | Status: AC | PRN
  Administered 2022-06-14 – 2022-06-19 (×5): 30 mg via INTRAVENOUS
  Filled 2022-06-14 (×5): qty 1

## 2022-06-14 MED ORDER — PHENOL 1.4 % MT LIQD
1.0000 | OROMUCOSAL | Status: DC | PRN
  Administered 2022-06-14: 1 via OROMUCOSAL
  Filled 2022-06-14: qty 177

## 2022-06-14 NOTE — Progress Notes (Signed)
1178m brownish gastric contents removed by suction

## 2022-06-14 NOTE — Progress Notes (Signed)
NGT connected to LIWS as ordered

## 2022-06-14 NOTE — Progress Notes (Signed)
Subjective: Doesn't feel well.  Feels "like I'm going septic."  No flatus or BM since surgery.  Says nausea is improved with medicine.  Vomiting feces yesterday.  ROS: See above, otherwise other systems negative  Objective: Vital signs in last 24 hours: Temp:  [97.9 F (36.6 C)-98.4 F (36.9 C)] 97.9 F (36.6 C) (06/16 0748) Pulse Rate:  [96-138] 96 (06/16 0748) Resp:  [15-26] 16 (06/16 0748) BP: (100-128)/(73-103) 113/73 (06/16 0748) SpO2:  [89 %-96 %] 95 % (06/16 0748)    Intake/Output from previous day: 06/15 0701 - 06/16 0700 In: 1043 [IV Piggyback:1043] Out: -  Intake/Output this shift: No intake/output data recorded.  PE: Gen: NAD, but doesn't appear to feel well Heart: regular Lungs: CTAB Abd: soft, some distention, hypoactive BS, minimally tender, incisions c/d/i  Lab Results:  Recent Labs    06/13/22 1950 06/14/22 0340  WBC 16.2* 13.0*  HGB 15.2 12.8*  HCT 44.4 38.2*  PLT 309 246   BMET Recent Labs    06/13/22 1950 06/14/22 0340  NA 133* 134*  K 4.5 3.9  CL 95* 102  CO2 22 22  GLUCOSE 168* 125*  BUN 18 18  CREATININE 1.53* 1.30*  CALCIUM 9.5 8.4*   PT/INR Recent Labs    06/13/22 2055  LABPROT 14.1  INR 1.1   CMP     Component Value Date/Time   NA 134 (L) 06/14/2022 0340   K 3.9 06/14/2022 0340   CL 102 06/14/2022 0340   CO2 22 06/14/2022 0340   GLUCOSE 125 (H) 06/14/2022 0340   BUN 18 06/14/2022 0340   CREATININE 1.30 (H) 06/14/2022 0340   CREATININE 1.06 10/10/2020 0915   CALCIUM 8.4 (L) 06/14/2022 0340   PROT 7.9 06/13/2022 1950   ALBUMIN 3.4 (L) 06/13/2022 1950   AST 43 (H) 06/13/2022 1950   ALT 38 06/13/2022 1950   ALKPHOS 70 06/13/2022 1950   BILITOT 1.8 (H) 06/13/2022 1950   GFRNONAA >60 06/14/2022 0340   GFRNONAA 73 10/10/2020 0915   GFRAA 84 10/10/2020 0915   Lipase  No results found for: "LIPASE"     Studies/Results: DG Abd Portable 1V  Result Date: 06/14/2022 CLINICAL DATA:  66 year old male with  history of postoperative ileus. Follow-up study. EXAM: PORTABLE ABDOMEN - 1 VIEW COMPARISON:  No priors. FINDINGS: Multiple dilated loops of small bowel are noted in the central abdomen measuring up to 4.5 cm in diameter. Oral contrast material is again noted in the cecum and ascending colon, with no progression compared to the prior CT examination. Some gas and stool is noted elsewhere throughout the distal colon and rectum. Iodinated contrast material in the lumen of the urinary bladder related to recent contrast enhanced CT examination. Vascular stents are noted in the region of the common iliac vasculature. Orthopedic fixation hardware in the lower lumbar spine. IMPRESSION: 1. Abnormal bowel-gas pattern, favored to reflect small bowel obstruction, as above. This was better demonstrated on recent CT examination. Electronically Signed   By: Vinnie Langton M.D.   On: 06/14/2022 07:30   CT ABDOMEN PELVIS W CONTRAST  Result Date: 06/13/2022 CLINICAL DATA:  Abdominal pain, post-op Bowel obstruction suspected EXAM: CT ABDOMEN AND PELVIS WITH CONTRAST TECHNIQUE: Multidetector CT imaging of the abdomen and pelvis was performed using the standard protocol following bolus administration of intravenous contrast. RADIATION DOSE REDUCTION: This exam was performed according to the departmental dose-optimization program which includes automated exposure control, adjustment of the mA and/or kV according to  patient size and/or use of iterative reconstruction technique. CONTRAST:  168m OMNIPAQUE IOHEXOL 300 MG/ML  SOLN COMPARISON:  06/10/2022 FINDINGS: Lower chest: Nodular infiltrate is developed within the visualized left lower lobe, possibly infectious in the acute setting. Mild bibasilar atelectasis. Hepatobiliary: Mild hepatic steatosis. No enhancing intrahepatic mass. No intra or extrahepatic biliary ductal dilation. Gallbladder unremarkable. Pancreas: Unremarkable Spleen: Unremarkable Adrenals/Urinary Tract: The  adrenal glands are unremarkable. The kidneys are normal in position. Mild bilateral renal cortical atrophy. There are at least 3 nonobstructing calculi identified within the interpolar region of the left kidney extending into the left renal pelvis measuring up to 15 mm. There is again noted mild dilation of the left renal pelvis and mild peripelvic infiltration likely related to secondary inflammatory change. No hydronephrosis, however, there is slightly delayed left renal cortical enhancement suggesting at least mild associated obstruction. No renal calculi on the right. No ureteral calculi. Multiple simple cortical cysts are seen within the kidneys bilaterally in keeping with simple renal cysts. No further follow-up is recommended for these lesions. The bladder is unremarkable. Stomach/Bowel: Interval ventral hernia repair with mesh. There is extensive subcutaneous gas within the abdominal wall and within the rectus sheath superiorly, likely postsurgical in nature. A few punctate foci of free intraperitoneal gas or noted inferiorly along with mild free intraperitoneal fluid within the peritoneal eventration within the pannus. There are multiple mildly dilated fluid-filled loops of proximal small bowel extending to a single point of transition within the mid pelvis anteriorly best seen axial image # 71/3 which may represent a partial or developing small bowel obstruction. The distal small bowel is decompressed. Oral contrast from prior CT examination is seen throughout the colon. There is severe descending and sigmoid colonic diverticulosis without superimposed acute inflammatory change. Appendix normal. Vascular/Lymphatic: Extensive aortoiliac atherosclerotic calcification. Prior stenting of the right comminuted and left common and external iliac arteries is noted. Stable 3.1 cm infrarenal abdominal aortic aneurysm no pathologic adenopathy within the abdomen and pelvis. Reproductive: Status post hysterectomy. No  adnexal masses. Other: No recurrent abdominal wall hernia.  Rectum unremarkable Musculoskeletal: L5-S1 lumbar fusion with instrumentation has been performed. Degenerative changes are seen within the visualized thoracolumbar spine. No acute bone abnormality. IMPRESSION: 1. Interval ventral hernia repair with mesh. Extensive subcutaneous gas within the abdominal wall and rectus sheath as well as a few punctate foci of free intraperitoneal gas and mild free intraperitoneal fluid, likely postsurgical in nature. 2. Multiple mildly dilated fluid-filled loops of proximal small bowel extending to a single point of transition within the mid pelvis anteriorly which may represent a partial or developing small bowel obstruction. Oral contrast noted within the colon represents contrast from the preoperative CT examination. 3. Nonobstructing left nephrolithiasis. Mild dilation of the left renal pelvis and mild peripelvic infiltration likely related to secondary inflammatory change. No hydronephrosis, however, there is slightly delayed left renal cortical enhancement suggesting at least mild associated obstruction. 4. Severe distal colonic diverticulosis without superimposed acute inflammatory change. 5. Stable 3.1 cm infrarenal abdominal aortic aneurysm. Recommend follow-up ultrasound every 3 years. This recommendation follows ACR consensus guidelines: White Paper of the ACR Incidental Findings Committee II on Vascular Findings. J Am Coll Radiol 2013; 10:789-794. 6. Nodular infiltrate within the visualized left lower lobe, possibly infectious in the acute setting. Aortic Atherosclerosis (ICD10-I70.0). Electronically Signed   By: AFidela SalisburyM.D.   On: 06/13/2022 23:02   DG Chest Portable 1 View  Result Date: 06/13/2022 CLINICAL DATA:  Postop EXAM: PORTABLE CHEST 1 VIEW  COMPARISON:  05/13/2022 FINDINGS: Linear subsegmental atelectasis in the right mid lung and left lower lung. Heart is normal size. Aortic atherosclerosis.  No effusions or acute bony abnormality. IMPRESSION: Areas of atelectasis bilaterally.  No active disease. Electronically Signed   By: Rolm Baptise M.D.   On: 06/13/2022 19:30    Anti-infectives: Anti-infectives (From admission, onward)    Start     Dose/Rate Route Frequency Ordered Stop   06/13/22 2100  piperacillin-tazobactam (ZOSYN) IVPB 3.375 g        3.375 g 100 mL/hr over 30 Minutes Intravenous  Once 06/13/22 2055 06/13/22 2138        Assessment/Plan POD 3, s/p robotic TAR, Dr. Rosendo Gros with post op ileus  -plain film this am with persistently dilated SB, but contrast is in his colon along with air c/w ileus and not obstruction -denies currently nausea, but belching some.  If he has further N/V, needs NGT placed.  Sb is over 4.5cm dilated. -mobilize and ambulate -IS -recheck labs in am  FEN - NPO/IVFs VTE - lovenox ID - none needed, got one dose of zosyn in ED for unclear reasons  Mild AKI - cr improved today.  Continue fluid hydration Leukocytosis - improving, no signs of infection, likely up secondary to dehydration COPD H/O prostate cancer   LOS: 1 day    Henreitta Cea , [facility] Surgery 06/14/2022, 8:11 AM Please see Amion for pager number during day hours 7:00am-4:30pm or 7:00am -11:30am on weekends

## 2022-06-15 LAB — BASIC METABOLIC PANEL
Anion gap: 11 (ref 5–15)
BUN: 22 mg/dL (ref 8–23)
CO2: 20 mmol/L — ABNORMAL LOW (ref 22–32)
Calcium: 7.9 mg/dL — ABNORMAL LOW (ref 8.9–10.3)
Chloride: 102 mmol/L (ref 98–111)
Creatinine, Ser: 1.09 mg/dL (ref 0.61–1.24)
GFR, Estimated: >60 mL/min (ref >60)
Glucose, Bld: 110 mg/dL — ABNORMAL HIGH (ref 70–99)
Potassium: 3.6 mmol/L (ref 3.5–5.1)
Sodium: 133 mmol/L — ABNORMAL LOW (ref 135–145)

## 2022-06-15 LAB — CBC
HCT: 36 % — ABNORMAL LOW (ref 39.0–52.0)
Hemoglobin: 11.8 g/dL — ABNORMAL LOW (ref 13.0–17.0)
MCH: 33.2 pg (ref 26.0–34.0)
MCHC: 32.8 g/dL (ref 30.0–36.0)
MCV: 101.4 fL — ABNORMAL HIGH (ref 80.0–100.0)
Platelets: 214 10*3/uL (ref 150–400)
RBC: 3.55 MIL/uL — ABNORMAL LOW (ref 4.22–5.81)
RDW: 13.4 % (ref 11.5–15.5)
WBC: 8.2 10*3/uL (ref 4.0–10.5)
nRBC: 0 % (ref 0.0–0.2)

## 2022-06-15 MED ORDER — ENOXAPARIN SODIUM 40 MG/0.4ML IJ SOSY
40.0000 mg | PREFILLED_SYRINGE | Freq: Every day | INTRAMUSCULAR | Status: DC
  Administered 2022-06-15 – 2022-06-20 (×5): 40 mg via SUBCUTANEOUS
  Filled 2022-06-15 (×6): qty 0.4

## 2022-06-15 NOTE — Progress Notes (Signed)
Responded to call from patient.  Patient c/o left jaw pain, as well as mid-anterior chest pain rated 8/10, burning sensation.  Denies DOB nor chest tightness.  VSS, room air saturation 92%, placed back on 2L [place] due to this pain.  Morphine '2mg'$  IV given as ordered.  Dr. Grandville Silos made aware.  Patient's needs addressed.

## 2022-06-15 NOTE — Progress Notes (Signed)
Subjective/Chief Complaint: Patient doing well today.  NG tube tolerated.  Output approximately 100 cc. Patient less nauseated and less distended today.   Objective: Vital signs in last 24 hours: Temp:  [97.9 F (36.6 C)-98.9 F (37.2 C)] 98.9 F (37.2 C) (06/17 0450) Pulse Rate:  [92-100] 92 (06/17 0450) Resp:  [16-20] 18 (06/17 0450) BP: (101-145)/(66-87) 124/74 (06/17 0450) SpO2:  [92 %-97 %] 95 % (06/17 0450) Last BM Date : 06/04/22  Intake/Output from previous day: 06/16 0701 - 06/17 0700 In: 3047.9 [I.V.:3047.9] Out: 1750 [Emesis/NG output:1750] Intake/Output this shift: Total I/O In: 3047.9 [I.V.:3047.9] Out: 650 [Emesis/NG output:650]  General appearance: alert and cooperative GI: Abdomen soft, nondistended, nontender to palpation.  Incisions are clean dry and intact nonerythematous.  Lab Results:  Recent Labs    06/14/22 0340 06/15/22 0055  WBC 13.0* 8.2  HGB 12.8* 11.8*  HCT 38.2* 36.0*  PLT 246 214   BMET Recent Labs    06/14/22 0340 06/15/22 0055  NA 134* 133*  K 3.9 3.6  CL 102 102  CO2 22 20*  GLUCOSE 125* 110*  BUN 18 22  CREATININE 1.30* 1.09  CALCIUM 8.4* 7.9*   PT/INR Recent Labs    06/13/22 2055  LABPROT 14.1  INR 1.1   ABG No results for input(s): "PHART", "HCO3" in the last 72 hours.  Invalid input(s): "PCO2", "PO2"  Studies/Results: DG Abd Portable 1V  Result Date: 06/14/2022 CLINICAL DATA:  NG tube placement EXAM: PORTABLE ABDOMEN - 1 VIEW COMPARISON:  06/14/2022 FINDINGS: Limited radiograph of the lower chest and upper abdomen was obtained for the purposes of enteric tube localization. Enteric tube is seen coursing below the diaphragm with distal tip terminating within the proximal gastric body and side port in the distal esophagus. Dilated small bowel loops within the included upper abdomen, as seen on recent prior study. IMPRESSION: 1. Enteric tube with distal tip terminating within the proximal gastric body and side  port in the distal esophagus. Recommend advancement of at least 5 cm. 2. Dilated small bowel loops within the included upper abdomen. Electronically Signed   By: Davina Poke D.O.   On: 06/14/2022 15:40   DG Abd Portable 1V  Result Date: 06/14/2022 CLINICAL DATA:  66 year old male with history of postoperative ileus. Follow-up study. EXAM: PORTABLE ABDOMEN - 1 VIEW COMPARISON:  No priors. FINDINGS: Multiple dilated loops of small bowel are noted in the central abdomen measuring up to 4.5 cm in diameter. Oral contrast material is again noted in the cecum and ascending colon, with no progression compared to the prior CT examination. Some gas and stool is noted elsewhere throughout the distal colon and rectum. Iodinated contrast material in the lumen of the urinary bladder related to recent contrast enhanced CT examination. Vascular stents are noted in the region of the common iliac vasculature. Orthopedic fixation hardware in the lower lumbar spine. IMPRESSION: 1. Abnormal bowel-gas pattern, favored to reflect small bowel obstruction, as above. This was better demonstrated on recent CT examination. Electronically Signed   By: Vinnie Langton M.D.   On: 06/14/2022 07:30   CT ABDOMEN PELVIS W CONTRAST  Result Date: 06/13/2022 CLINICAL DATA:  Abdominal pain, post-op Bowel obstruction suspected EXAM: CT ABDOMEN AND PELVIS WITH CONTRAST TECHNIQUE: Multidetector CT imaging of the abdomen and pelvis was performed using the standard protocol following bolus administration of intravenous contrast. RADIATION DOSE REDUCTION: This exam was performed according to the departmental dose-optimization program which includes automated exposure control, adjustment of the mA  and/or kV according to patient size and/or use of iterative reconstruction technique. CONTRAST:  156m OMNIPAQUE IOHEXOL 300 MG/ML  SOLN COMPARISON:  06/10/2022 FINDINGS: Lower chest: Nodular infiltrate is developed within the visualized left lower lobe,  possibly infectious in the acute setting. Mild bibasilar atelectasis. Hepatobiliary: Mild hepatic steatosis. No enhancing intrahepatic mass. No intra or extrahepatic biliary ductal dilation. Gallbladder unremarkable. Pancreas: Unremarkable Spleen: Unremarkable Adrenals/Urinary Tract: The adrenal glands are unremarkable. The kidneys are normal in position. Mild bilateral renal cortical atrophy. There are at least 3 nonobstructing calculi identified within the interpolar region of the left kidney extending into the left renal pelvis measuring up to 15 mm. There is again noted mild dilation of the left renal pelvis and mild peripelvic infiltration likely related to secondary inflammatory change. No hydronephrosis, however, there is slightly delayed left renal cortical enhancement suggesting at least mild associated obstruction. No renal calculi on the right. No ureteral calculi. Multiple simple cortical cysts are seen within the kidneys bilaterally in keeping with simple renal cysts. No further follow-up is recommended for these lesions. The bladder is unremarkable. Stomach/Bowel: Interval ventral hernia repair with mesh. There is extensive subcutaneous gas within the abdominal wall and within the rectus sheath superiorly, likely postsurgical in nature. A few punctate foci of free intraperitoneal gas or noted inferiorly along with mild free intraperitoneal fluid within the peritoneal eventration within the pannus. There are multiple mildly dilated fluid-filled loops of proximal small bowel extending to a single point of transition within the mid pelvis anteriorly best seen axial image # 71/3 which may represent a partial or developing small bowel obstruction. The distal small bowel is decompressed. Oral contrast from prior CT examination is seen throughout the colon. There is severe descending and sigmoid colonic diverticulosis without superimposed acute inflammatory change. Appendix normal. Vascular/Lymphatic:  Extensive aortoiliac atherosclerotic calcification. Prior stenting of the right comminuted and left common and external iliac arteries is noted. Stable 3.1 cm infrarenal abdominal aortic aneurysm no pathologic adenopathy within the abdomen and pelvis. Reproductive: Status post hysterectomy. No adnexal masses. Other: No recurrent abdominal wall hernia.  Rectum unremarkable Musculoskeletal: L5-S1 lumbar fusion with instrumentation has been performed. Degenerative changes are seen within the visualized thoracolumbar spine. No acute bone abnormality. IMPRESSION: 1. Interval ventral hernia repair with mesh. Extensive subcutaneous gas within the abdominal wall and rectus sheath as well as a few punctate foci of free intraperitoneal gas and mild free intraperitoneal fluid, likely postsurgical in nature. 2. Multiple mildly dilated fluid-filled loops of proximal small bowel extending to a single point of transition within the mid pelvis anteriorly which may represent a partial or developing small bowel obstruction. Oral contrast noted within the colon represents contrast from the preoperative CT examination. 3. Nonobstructing left nephrolithiasis. Mild dilation of the left renal pelvis and mild peripelvic infiltration likely related to secondary inflammatory change. No hydronephrosis, however, there is slightly delayed left renal cortical enhancement suggesting at least mild associated obstruction. 4. Severe distal colonic diverticulosis without superimposed acute inflammatory change. 5. Stable 3.1 cm infrarenal abdominal aortic aneurysm. Recommend follow-up ultrasound every 3 years. This recommendation follows ACR consensus guidelines: White Paper of the ACR Incidental Findings Committee II on Vascular Findings. J Am Coll Radiol 2013; 10:789-794. 6. Nodular infiltrate within the visualized left lower lobe, possibly infectious in the acute setting. Aortic Atherosclerosis (ICD10-I70.0). Electronically Signed   By: AFidela SalisburyM.D.   On: 06/13/2022 23:02   DG Chest Portable 1 View  Result Date: 06/13/2022 CLINICAL DATA:  Postop  EXAM: PORTABLE CHEST 1 VIEW COMPARISON:  05/13/2022 FINDINGS: Linear subsegmental atelectasis in the right mid lung and left lower lung. Heart is normal size. Aortic atherosclerosis. No effusions or acute bony abnormality. IMPRESSION: Areas of atelectasis bilaterally.  No active disease. Electronically Signed   By: Rolm Baptise M.D.   On: 06/13/2022 19:30    Anti-infectives: Anti-infectives (From admission, onward)    Start     Dose/Rate Route Frequency Ordered Stop   06/13/22 2100  piperacillin-tazobactam (ZOSYN) IVPB 3.375 g        3.375 g 100 mL/hr over 30 Minutes Intravenous  Once 06/13/22 2055 06/13/22 2138       Assessment/Plan: 66 year old male postop day 4 from robotic incisional hernia pair with mesh. Patient with ileus postoperatively. Continue NG tube today.  Hopefully can DC tomorrow.  We will repeat CT scan in a.m.  Encourage ambulation..  LOS: 2 days    Ralene Ok 06/15/2022

## 2022-06-15 NOTE — Progress Notes (Signed)
Patient ambulated independently to the bathroom.  Encouraged patient to ambulate in the hallway with RN assisting, patient refused at this time stating "I just feel so weak right now, I don't feel good".  Assisted back in bed, NGT hooked up to ILWS.

## 2022-06-16 ENCOUNTER — Inpatient Hospital Stay (HOSPITAL_COMMUNITY): Payer: Medicare Other

## 2022-06-16 LAB — BASIC METABOLIC PANEL
Anion gap: 12 (ref 5–15)
BUN: 24 mg/dL — ABNORMAL HIGH (ref 8–23)
CO2: 20 mmol/L — ABNORMAL LOW (ref 22–32)
Calcium: 8.4 mg/dL — ABNORMAL LOW (ref 8.9–10.3)
Chloride: 105 mmol/L (ref 98–111)
Creatinine, Ser: 1.08 mg/dL (ref 0.61–1.24)
GFR, Estimated: >60 mL/min (ref >60)
Glucose, Bld: 109 mg/dL — ABNORMAL HIGH (ref 70–99)
Potassium: 3.4 mmol/L — ABNORMAL LOW (ref 3.5–5.1)
Sodium: 137 mmol/L (ref 135–145)

## 2022-06-16 LAB — CBC
HCT: 33.3 % — ABNORMAL LOW (ref 39.0–52.0)
Hemoglobin: 11.4 g/dL — ABNORMAL LOW (ref 13.0–17.0)
MCH: 34.4 pg — ABNORMAL HIGH (ref 26.0–34.0)
MCHC: 34.2 g/dL (ref 30.0–36.0)
MCV: 100.6 fL — ABNORMAL HIGH (ref 80.0–100.0)
Platelets: 185 10*3/uL (ref 150–400)
RBC: 3.31 MIL/uL — ABNORMAL LOW (ref 4.22–5.81)
RDW: 13.3 % (ref 11.5–15.5)
WBC: 9 10*3/uL (ref 4.0–10.5)
nRBC: 0 % (ref 0.0–0.2)

## 2022-06-16 MED ORDER — POTASSIUM CHLORIDE 10 MEQ/100ML IV SOLN
10.0000 meq | INTRAVENOUS | Status: AC
  Administered 2022-06-16 (×4): 10 meq via INTRAVENOUS
  Filled 2022-06-16 (×2): qty 100

## 2022-06-16 MED ORDER — METOCLOPRAMIDE HCL 5 MG/ML IJ SOLN
10.0000 mg | Freq: Four times a day (QID) | INTRAMUSCULAR | Status: DC
  Administered 2022-06-16 – 2022-06-21 (×20): 10 mg via INTRAVENOUS
  Filled 2022-06-16 (×20): qty 2

## 2022-06-16 NOTE — Progress Notes (Signed)
Mobility Specialist Progress Note:   06/16/22 1610  Mobility  Activity Ambulated with assistance in hallway  Level of Assistance Standby assist, set-up cues, supervision of patient - no hands on  Assistive Device Front wheel walker  Distance Ambulated (ft) 450 ft  Activity Response Tolerated well  $Mobility charge 1 Mobility   Pt agreeable to mobility session. Required no physical assist, only min guard for safety. Pt c/o dizziness throughout gait, requiring two seated rest breaks during session. Pt back in bed with all needs met, visitors in room.   Nelta Numbers Acute Rehab Secure Chat or Office Phone: [phone]

## 2022-06-16 NOTE — Progress Notes (Signed)
Subjective/Chief Complaint: Pt with some flatus KUB reviewed   Objective: Vital signs in last 24 hours: Temp:  [97.8 F (36.6 C)-99 F (37.2 C)] 98.1 F (36.7 C) (06/18 0453) Pulse Rate:  [79-97] 79 (06/18 0453) Resp:  [16-18] 18 (06/18 0453) BP: (114-146)/(77-81) 143/81 (06/18 0453) SpO2:  [92 %-98 %] 98 % (06/18 0453) Last BM Date :  (PTA)  Intake/Output from previous day: 06/17 0701 - 06/18 0700 In: 2803.2 [P.O.:120; I.V.:2633.2; IV Piggyback:50] Out: 2475 [Emesis/NG output:2475] Intake/Output this shift: Total I/O In: 956.8 [I.V.:906.8; IV Piggyback:50] Out: 800 [Emesis/NG output:800]  General appearance: alert and cooperative GI: soft, nd, nttp, hypoactive bowel sounds  Lab Results:  Recent Labs    06/15/22 0055 06/16/22 0045  WBC 8.2 9.0  HGB 11.8* 11.4*  HCT 36.0* 33.3*  PLT 214 185   BMET Recent Labs    06/15/22 0055 06/16/22 0045  NA 133* 137  K 3.6 3.4*  CL 102 105  CO2 20* 20*  GLUCOSE 110* 109*  BUN 22 24*  CREATININE 1.09 1.08  CALCIUM 7.9* 8.4*   PT/INR Recent Labs    06/13/22 2055  LABPROT 14.1  INR 1.1   ABG No results for input(s): "PHART", "HCO3" in the last 72 hours.  Invalid input(s): "PCO2", "PO2"  Studies/Results: DG Abd Portable 1V  Result Date: 06/14/2022 CLINICAL DATA:  NG tube placement EXAM: PORTABLE ABDOMEN - 1 VIEW COMPARISON:  06/14/2022 FINDINGS: Limited radiograph of the lower chest and upper abdomen was obtained for the purposes of enteric tube localization. Enteric tube is seen coursing below the diaphragm with distal tip terminating within the proximal gastric body and side port in the distal esophagus. Dilated small bowel loops within the included upper abdomen, as seen on recent prior study. IMPRESSION: 1. Enteric tube with distal tip terminating within the proximal gastric body and side port in the distal esophagus. Recommend advancement of at least 5 cm. 2. Dilated small bowel loops within the included  upper abdomen. Electronically Signed   By: Davina Poke D.O.   On: 06/14/2022 15:40   DG Abd Portable 1V  Result Date: 06/14/2022 CLINICAL DATA:  66 year old male with history of postoperative ileus. Follow-up study. EXAM: PORTABLE ABDOMEN - 1 VIEW COMPARISON:  No priors. FINDINGS: Multiple dilated loops of small bowel are noted in the central abdomen measuring up to 4.5 cm in diameter. Oral contrast material is again noted in the cecum and ascending colon, with no progression compared to the prior CT examination. Some gas and stool is noted elsewhere throughout the distal colon and rectum. Iodinated contrast material in the lumen of the urinary bladder related to recent contrast enhanced CT examination. Vascular stents are noted in the region of the common iliac vasculature. Orthopedic fixation hardware in the lower lumbar spine. IMPRESSION: 1. Abnormal bowel-gas pattern, favored to reflect small bowel obstruction, as above. This was better demonstrated on recent CT examination. Electronically Signed   By: Vinnie Langton M.D.   On: 06/14/2022 07:30    Anti-infectives: Anti-infectives (From admission, onward)    Start     Dose/Rate Route Frequency Ordered Stop   06/13/22 2100  piperacillin-tazobactam (ZOSYN) IVPB 3.375 g        3.375 g 100 mL/hr over 30 Minutes Intravenous  Once 06/13/22 2055 06/13/22 2138       Assessment/Plan: 66 year old male postop day 5 from robotic incisional hernia pair with mesh. 1 plan NGT clamping trials today 2. Encourage mobilization 3. K supp 4. OK for  sip and chips   LOS: 3 days    Ralene Ok 06/16/2022

## 2022-06-17 ENCOUNTER — Inpatient Hospital Stay (HOSPITAL_COMMUNITY): Payer: Medicare Other

## 2022-06-17 ENCOUNTER — Inpatient Hospital Stay: Payer: Self-pay

## 2022-06-17 LAB — HEMOGLOBIN A1C
Hgb A1c MFr Bld: 5.3 % (ref 4.8–5.6)
Mean Plasma Glucose: 105.41 mg/dL

## 2022-06-17 LAB — CBC
HCT: 35 % — ABNORMAL LOW (ref 39.0–52.0)
Hemoglobin: 11.7 g/dL — ABNORMAL LOW (ref 13.0–17.0)
MCH: 33.7 pg (ref 26.0–34.0)
MCHC: 33.4 g/dL (ref 30.0–36.0)
MCV: 100.9 fL — ABNORMAL HIGH (ref 80.0–100.0)
Platelets: 217 10*3/uL (ref 150–400)
RBC: 3.47 MIL/uL — ABNORMAL LOW (ref 4.22–5.81)
RDW: 13.2 % (ref 11.5–15.5)
WBC: 10.1 10*3/uL (ref 4.0–10.5)
nRBC: 0 % (ref 0.0–0.2)

## 2022-06-17 LAB — BASIC METABOLIC PANEL
Anion gap: 12 (ref 5–15)
BUN: 16 mg/dL (ref 8–23)
CO2: 22 mmol/L (ref 22–32)
Calcium: 8.6 mg/dL — ABNORMAL LOW (ref 8.9–10.3)
Chloride: 104 mmol/L (ref 98–111)
Creatinine, Ser: 0.84 mg/dL (ref 0.61–1.24)
GFR, Estimated: >60 mL/min (ref >60)
Glucose, Bld: 98 mg/dL (ref 70–99)
Potassium: 3.3 mmol/L — ABNORMAL LOW (ref 3.5–5.1)
Sodium: 138 mmol/L (ref 135–145)

## 2022-06-17 LAB — PHOSPHORUS: Phosphorus: 3.3 mg/dL (ref 2.5–4.6)

## 2022-06-17 LAB — GLUCOSE, CAPILLARY: Glucose-Capillary: 128 mg/dL — ABNORMAL HIGH (ref 70–99)

## 2022-06-17 LAB — MAGNESIUM: Magnesium: 1.9 mg/dL (ref 1.7–2.4)

## 2022-06-17 MED ORDER — INSULIN ASPART 100 UNIT/ML IJ SOLN
0.0000 [IU] | Freq: Four times a day (QID) | INTRAMUSCULAR | Status: DC
  Administered 2022-06-17 – 2022-06-18 (×3): 1 [IU] via SUBCUTANEOUS
  Administered 2022-06-18 (×2): 2 [IU] via SUBCUTANEOUS
  Administered 2022-06-19 – 2022-06-20 (×4): 1 [IU] via SUBCUTANEOUS

## 2022-06-17 MED ORDER — CHLORHEXIDINE GLUCONATE CLOTH 2 % EX PADS
6.0000 | MEDICATED_PAD | Freq: Every day | CUTANEOUS | Status: DC
  Administered 2022-06-17 – 2022-06-20 (×4): 6 via TOPICAL

## 2022-06-17 MED ORDER — SODIUM CHLORIDE 0.9% FLUSH
10.0000 mL | INTRAVENOUS | Status: DC | PRN
  Administered 2022-06-20: 10 mL

## 2022-06-17 MED ORDER — DIPHENHYDRAMINE HCL 50 MG/ML IJ SOLN
12.5000 mg | Freq: Four times a day (QID) | INTRAMUSCULAR | Status: DC | PRN
  Administered 2022-06-17: 12.5 mg via INTRAVENOUS
  Filled 2022-06-17: qty 1

## 2022-06-17 MED ORDER — POTASSIUM CHLORIDE 10 MEQ/100ML IV SOLN
10.0000 meq | INTRAVENOUS | Status: AC
  Administered 2022-06-17 (×6): 10 meq via INTRAVENOUS
  Filled 2022-06-17 (×6): qty 100

## 2022-06-17 MED ORDER — BISACODYL 10 MG RE SUPP
10.0000 mg | Freq: Once | RECTAL | Status: AC
  Administered 2022-06-17: 10 mg via RECTAL
  Filled 2022-06-17: qty 1

## 2022-06-17 MED ORDER — KCL IN DEXTROSE-NACL 40-5-0.9 MEQ/L-%-% IV SOLN
INTRAVENOUS | Status: DC
  Filled 2022-06-17 (×3): qty 1000

## 2022-06-17 MED ORDER — KCL IN DEXTROSE-NACL 40-5-0.9 MEQ/L-%-% IV SOLN
INTRAVENOUS | Status: AC
  Filled 2022-06-17: qty 1000

## 2022-06-17 MED ORDER — DIATRIZOATE MEGLUMINE & SODIUM 66-10 % PO SOLN
90.0000 mL | Freq: Once | ORAL | Status: AC
Start: 2022-06-17 — End: 2022-06-17
  Administered 2022-06-17: 90 mL via NASOGASTRIC
  Filled 2022-06-17: qty 90

## 2022-06-17 MED ORDER — SODIUM CHLORIDE 0.9% FLUSH
10.0000 mL | Freq: Two times a day (BID) | INTRAVENOUS | Status: DC
  Administered 2022-06-17 (×2): 20 mL
  Administered 2022-06-19 – 2022-06-20 (×3): 10 mL

## 2022-06-17 MED ORDER — TRAVASOL 10 % IV SOLN
INTRAVENOUS | Status: AC
  Filled 2022-06-17: qty 576

## 2022-06-17 NOTE — Progress Notes (Signed)
PHARMACY - TOTAL PARENTERAL NUTRITION CONSULT NOTE   Indication: Prolonged ileus  Patient Measurements: HT 175.3 cm WT 90.5 kg BMI 29.45 Usual Weight: recent recorded as 90.5 kg (~100kg 11/2021)  Assessment:  39 YOM with hernia now s/p robotic TAR from 6/13, has not passed flatus or stool since surgery and presented to ED due to ongoing n/v all day not able to tolerate any PO. CT shows possible SBO and post-op changes. NPO since readmission 6/15.   Glucose / Insulin: CBGs 98-110, no insulin Electrolytes: k 3.3 (pending IV kcl10 x6), Na 138 up, CoCa 9.08, Mg 1.9, Phos 3.3, others wnl Renal: Scr 0.84 (@ bsl), LFTs Hepatic: 6/15 LFTs wnl (AST mildly elevated), alb 3.4, t bili 1.8 up  Intake / Output; MIVF: LBM prior to previous admission 6/13, emesis/BG 1900 mL, UOP not charted  GI Imaging:  6/15 CT abdomen: subcutaneous gas within the abdominal wall and rectus sheath as well as a few punctate foci of free intraperitoneal gas, multiple dilated fluid-filled loops of proximal small bowel extending to a single point of transition within the mid pelvis anteriorly which may represent a partial or developing small bowel obstruction, severe distal colonic diverticulosis GI Surgeries / Procedures:  6/13 incisional hernia repair with mesh  Central access: PICC 6/19 TPN start date: 06/17/22  Nutritional Goals: Goal TPN rate is 95 mL/hr (provides 114g of protein and 2348 kcals per day)  RD Assessment: pending    Current Nutrition:  NPO  Plan:  Start TPN at 50% goal rate of 48 mL/hr at 1800 (provides 58g AA, 1186 kcal per day) Electrolytes in TPN: Na 34mq/L, K 524m/L, Ca 41m22mL, Mg 41mE841m, and Phos 141mm66m. Cl:Ac 1:1 Add standard MVI and trace elements to TPN Initiate Sensitive q6h SSI and adjust as needed  IVF (D5/1/2NS + Kcl40) currently 100mL/4mreduce MIVF to 10 mL/hr at 1800 Monitor TPN labs on Mon/Thurs, additional labs as needed  Thank you for allowing pharmacy to be a part of  this patient's care.  AdrienArdyth HarpsmD Clinical Pharmacist

## 2022-06-17 NOTE — Progress Notes (Signed)
   Subjective/Chief Complaint: Pt with no acute changes KUB reviewed   Objective: Vital signs in last 24 hours: Temp:  [97.4 F (36.3 C)-98.6 F (37 C)] 98.6 F (37 C) (06/19 0507) Pulse Rate:  [77-89] 89 (06/19 0507) Resp:  [16-18] 16 (06/19 0507) BP: (131-152)/(78-87) 152/85 (06/19 0507) SpO2:  [94 %-100 %] 97 % (06/19 0507) Last BM Date :  (PTA)  Intake/Output from previous day: 06/18 0701 - 06/19 0700 In: 240 [P.O.:240] Out: 1100 [Emesis/NG output:1100] Intake/Output this shift: Total I/O In: -  Out: 600 [Emesis/NG output:600]  General appearance: alert and cooperative GI: soft, non-tender; bowel sounds normal; no masses,  no organomegaly and inc c/d/i  Lab Results:  Recent Labs    06/16/22 0045 06/17/22 0313  WBC 9.0 10.1  HGB 11.4* 11.7*  HCT 33.3* 35.0*  PLT 185 217   BMET Recent Labs    06/16/22 0045 06/17/22 0313  NA 137 138  K 3.4* 3.3*  CL 105 104  CO2 20* 22  GLUCOSE 109* 98  BUN 24* 16  CREATININE 1.08 0.84  CALCIUM 8.4* 8.6*   PT/INR No results for input(s): "LABPROT", "INR" in the last 72 hours. ABG No results for input(s): "PHART", "HCO3" in the last 72 hours.  Invalid input(s): "PCO2", "PO2"  Studies/Results: [place] EKG SITE RITE  Result Date: 06/17/2022 If Site Rite image not attached, placement could not be confirmed due to current cardiac rhythm.  DG Abd Portable 1V  Result Date: 06/16/2022 CLINICAL DATA:  Ileus.  NG tube placement. EXAM: PORTABLE ABDOMEN - 1 VIEW COMPARISON:  06/14/2022 FINDINGS: An NG tube is identified with side hole overlying the distal esophagus and tip just into the stomach. Distended small bowel loops are again noted. Contrast within the colon is present. IMPRESSION: 1. NG tube with side hole overlying the distal esophagus and tip just into the stomach. Recommend advancement. 2. Distended small bowel loops again noted. Electronically Signed   By: Margarette Canada M.D.   On: 06/16/2022 12:08      Assessment/Plan: 66 year old male postop day 6 from robotic incisional hernia pair with mesh. 1 cont' NGT 2. Encourage mobilization 3. K supp 4. OK for sip and chips 5.  Will plan PICC and TNA   LOS: 4 days    Ralene Ok 06/17/2022

## 2022-06-17 NOTE — Care Management Important Message (Signed)
Important Message  Patient Details  Name: David King MRN: [id number] Date of Birth: Oct 05, 1956   Medicare Important Message Given:  Yes     Memory Argue 06/17/2022, 3:22 PM

## 2022-06-17 NOTE — Progress Notes (Signed)
Peripherally Inserted Central Catheter Placement  The IV Nurse has discussed with the patient and/or persons authorized to consent for the patient, the purpose of this procedure and the potential benefits and risks involved with this procedure.  The benefits include less needle sticks, lab draws from the catheter, and the patient may be discharged home with the catheter. Risks include, but not limited to, infection, bleeding, blood clot (thrombus formation), and puncture of an artery; nerve damage and irregular heartbeat and possibility to perform a PICC exchange if needed/ordered by physician.  Alternatives to this procedure were also discussed.  Bard Power PICC patient education guide, fact sheet on infection prevention and patient information card has been provided to patient /or left at bedside.    PICC Placement Documentation  PICC Double Lumen 06/17/22 Right Brachial 42 cm 0 cm (Active)  Indication for Insertion or Continuance of Line Administration of hyperosmolar/irritating solutions (i.e. TPN, Vancomycin, etc.) 06/17/22 0839  Exposed Catheter (cm) 0 cm 06/17/22 0839  Site Assessment Clean, Dry, Intact 06/17/22 0839  Lumen #1 Status Flushed;Saline locked;Blood return noted 06/17/22 0839  Lumen #2 Status Flushed;Saline locked;Blood return noted 06/17/22 0839  Dressing Type Transparent;Securing device 06/17/22 0839  Dressing Status Antimicrobial disc in place;Clean, Dry, Intact 06/17/22 0839  Safety Lock Not Applicable 37/04/88 8916  Line Adjustment (NICU/IV Team Only) No 06/17/22 0839  Dressing Intervention Other (Comment) 06/17/22 0839  Dressing Change Due 06/24/22 06/17/22 [place], [place] 06/17/2022, 8:40 AM

## 2022-06-18 ENCOUNTER — Encounter (HOSPITAL_COMMUNITY)
Admission: EM | Disposition: A | Discharge status: Home / Self Care | Payer: Self-pay | Source: Home / Self Care | Attending: General Surgery

## 2022-06-18 ENCOUNTER — Inpatient Hospital Stay (HOSPITAL_COMMUNITY): Payer: Medicare Other | Admitting: Anesthesiology

## 2022-06-18 ENCOUNTER — Inpatient Hospital Stay (HOSPITAL_COMMUNITY): Payer: Medicare Other

## 2022-06-18 ENCOUNTER — Encounter (HOSPITAL_COMMUNITY): Payer: Self-pay | Admitting: General Surgery

## 2022-06-18 ENCOUNTER — Other Ambulatory Visit: Payer: Self-pay

## 2022-06-18 DIAGNOSIS — I2510 Atherosclerotic heart disease of native coronary artery without angina pectoris: Secondary | ICD-10-CM

## 2022-06-18 DIAGNOSIS — I10 Essential (primary) hypertension: Secondary | ICD-10-CM

## 2022-06-18 DIAGNOSIS — J449 Chronic obstructive pulmonary disease, unspecified: Secondary | ICD-10-CM

## 2022-06-18 DIAGNOSIS — K56609 Unspecified intestinal obstruction, unspecified as to partial versus complete obstruction: Secondary | ICD-10-CM

## 2022-06-18 HISTORY — PX: LAPAROSCOPY: SHX197

## 2022-06-18 HISTORY — PX: INSERTION OF MESH: SHX5868

## 2022-06-18 HISTORY — PX: INCISIONAL HERNIA REPAIR: SHX193

## 2022-06-18 LAB — GLUCOSE, CAPILLARY
Glucose-Capillary: 121 mg/dL — ABNORMAL HIGH (ref 70–99)
Glucose-Capillary: 137 mg/dL — ABNORMAL HIGH (ref 70–99)
Glucose-Capillary: 163 mg/dL — ABNORMAL HIGH (ref 70–99)
Glucose-Capillary: 164 mg/dL — ABNORMAL HIGH (ref 70–99)
Glucose-Capillary: 171 mg/dL — ABNORMAL HIGH (ref 70–99)

## 2022-06-18 LAB — CBC
HCT: 34.4 % — ABNORMAL LOW (ref 39.0–52.0)
Hemoglobin: 11.5 g/dL — ABNORMAL LOW (ref 13.0–17.0)
MCH: 33.8 pg (ref 26.0–34.0)
MCHC: 33.4 g/dL (ref 30.0–36.0)
MCV: 101.2 fL — ABNORMAL HIGH (ref 80.0–100.0)
Platelets: 231 10*3/uL (ref 150–400)
RBC: 3.4 MIL/uL — ABNORMAL LOW (ref 4.22–5.81)
RDW: 13.1 % (ref 11.5–15.5)
WBC: 12.2 10*3/uL — ABNORMAL HIGH (ref 4.0–10.5)
nRBC: 0 % (ref 0.0–0.2)

## 2022-06-18 LAB — COMPREHENSIVE METABOLIC PANEL
ALT: 18 U/L (ref 0–44)
AST: 17 U/L (ref 15–41)
Albumin: 2.4 g/dL — ABNORMAL LOW (ref 3.5–5.0)
Alkaline Phosphatase: 52 U/L (ref 38–126)
Anion gap: 8 (ref 5–15)
BUN: 15 mg/dL (ref 8–23)
CO2: 25 mmol/L (ref 22–32)
Calcium: 8.7 mg/dL — ABNORMAL LOW (ref 8.9–10.3)
Chloride: 104 mmol/L (ref 98–111)
Creatinine, Ser: 0.84 mg/dL (ref 0.61–1.24)
GFR, Estimated: >60 mL/min (ref >60)
Glucose, Bld: 129 mg/dL — ABNORMAL HIGH (ref 70–99)
Potassium: 3.2 mmol/L — ABNORMAL LOW (ref 3.5–5.1)
Sodium: 137 mmol/L (ref 135–145)
Total Bilirubin: 1.1 mg/dL (ref 0.3–1.2)
Total Protein: 6.4 g/dL — ABNORMAL LOW (ref 6.5–8.1)

## 2022-06-18 LAB — TRIGLYCERIDES: Triglycerides: 133 mg/dL (ref <150)

## 2022-06-18 LAB — CULTURE, BLOOD (ROUTINE X 2)
Culture: NO GROWTH
Culture: NO GROWTH
Special Requests: ADEQUATE
Special Requests: ADEQUATE

## 2022-06-18 SURGERY — LAPAROSCOPY, DIAGNOSTIC
Anesthesia: General | Site: Abdomen

## 2022-06-18 MED ORDER — PROPOFOL 10 MG/ML IV BOLUS
INTRAVENOUS | Status: AC
  Filled 2022-06-18: qty 20

## 2022-06-18 MED ORDER — CEFAZOLIN SODIUM-DEXTROSE 2-4 GM/100ML-% IV SOLN
INTRAVENOUS | Status: AC
  Filled 2022-06-18: qty 100

## 2022-06-18 MED ORDER — SUCCINYLCHOLINE CHLORIDE 200 MG/10ML IV SOSY
PREFILLED_SYRINGE | INTRAVENOUS | Status: DC | PRN
  Administered 2022-06-18: 100 mg via INTRAVENOUS

## 2022-06-18 MED ORDER — CHLORHEXIDINE GLUCONATE 0.12 % MT SOLN: 15.0000 mL | Freq: Once | OROMUCOSAL | Status: AC

## 2022-06-18 MED ORDER — SODIUM CHLORIDE 0.9 % IR SOLN
Status: DC | PRN
  Administered 2022-06-18: 1000 mL

## 2022-06-18 MED ORDER — ROCURONIUM BROMIDE 10 MG/ML (PF) SYRINGE
PREFILLED_SYRINGE | INTRAVENOUS | Status: AC
  Filled 2022-06-18: qty 10

## 2022-06-18 MED ORDER — FENTANYL CITRATE (PF) 250 MCG/5ML IJ SOLN
INTRAMUSCULAR | Status: DC | PRN
  Administered 2022-06-18 (×3): 50 ug via INTRAVENOUS
  Administered 2022-06-18: 100 ug via INTRAVENOUS

## 2022-06-18 MED ORDER — ROCURONIUM BROMIDE 10 MG/ML (PF) SYRINGE
PREFILLED_SYRINGE | INTRAVENOUS | Status: DC | PRN
  Administered 2022-06-18: 50 mg via INTRAVENOUS

## 2022-06-18 MED ORDER — LIDOCAINE 2% (20 MG/ML) 5 ML SYRINGE
INTRAMUSCULAR | Status: AC
  Filled 2022-06-18: qty 5

## 2022-06-18 MED ORDER — TRAVASOL 10 % IV SOLN
INTRAVENOUS | Status: AC
  Filled 2022-06-18: qty 1139

## 2022-06-18 MED ORDER — ORAL CARE MOUTH RINSE: 15.0000 mL | Freq: Once | OROMUCOSAL | Status: AC

## 2022-06-18 MED ORDER — MIDAZOLAM HCL 2 MG/2ML IJ SOLN
INTRAMUSCULAR | Status: AC
  Filled 2022-06-18: qty 2

## 2022-06-18 MED ORDER — ONDANSETRON HCL 4 MG/2ML IJ SOLN
INTRAMUSCULAR | Status: AC
  Filled 2022-06-18: qty 2

## 2022-06-18 MED ORDER — DEXAMETHASONE SODIUM PHOSPHATE 10 MG/ML IJ SOLN
INTRAMUSCULAR | Status: AC
  Filled 2022-06-18: qty 1

## 2022-06-18 MED ORDER — BUPIVACAINE-EPINEPHRINE (PF) 0.25% -1:200000 IJ SOLN
INTRAMUSCULAR | Status: AC
Start: 2022-06-18 — End: ?
  Filled 2022-06-18: qty 30

## 2022-06-18 MED ORDER — SUGAMMADEX SODIUM 200 MG/2ML IV SOLN
INTRAVENOUS | Status: DC | PRN
  Administered 2022-06-18: 200 mg via INTRAVENOUS

## 2022-06-18 MED ORDER — IBUPROFEN 400 MG PO TABS: 400.0000 mg | ORAL_TABLET | Freq: Four times a day (QID) | ORAL | Status: DC | PRN

## 2022-06-18 MED ORDER — FENTANYL CITRATE (PF) 250 MCG/5ML IJ SOLN
INTRAMUSCULAR | Status: AC
  Filled 2022-06-18: qty 5

## 2022-06-18 MED ORDER — HYDROMORPHONE HCL 1 MG/ML IJ SOLN
0.2500 mg | INTRAMUSCULAR | Status: DC | PRN
  Administered 2022-06-18 (×2): 0.5 mg via INTRAVENOUS

## 2022-06-18 MED ORDER — LACTATED RINGERS IV SOLN: INTRAVENOUS | Status: DC

## 2022-06-18 MED ORDER — DEXAMETHASONE SODIUM PHOSPHATE 10 MG/ML IJ SOLN
INTRAMUSCULAR | Status: DC | PRN
  Administered 2022-06-18: 5 mg via INTRAVENOUS

## 2022-06-18 MED ORDER — 0.9 % SODIUM CHLORIDE (POUR BTL) OPTIME
TOPICAL | Status: DC | PRN
  Administered 2022-06-18: 1000 mL

## 2022-06-18 MED ORDER — ONDANSETRON HCL 4 MG/2ML IJ SOLN
4.0000 mg | Freq: Once | INTRAMUSCULAR | Status: DC | PRN
Start: 2022-06-18 — End: 2022-06-18

## 2022-06-18 MED ORDER — BUPIVACAINE-EPINEPHRINE (PF) 0.25% -1:200000 IJ SOLN
INTRAMUSCULAR | Status: DC | PRN
  Administered 2022-06-18: 4 mL via PERINEURAL

## 2022-06-18 MED ORDER — CHLORHEXIDINE GLUCONATE 0.12 % MT SOLN
OROMUCOSAL | Status: AC
  Administered 2022-06-18: 15 mL via OROMUCOSAL
  Filled 2022-06-18: qty 15

## 2022-06-18 MED ORDER — LIDOCAINE 2% (20 MG/ML) 5 ML SYRINGE
INTRAMUSCULAR | Status: DC | PRN
  Administered 2022-06-18: 40 mg via INTRAVENOUS

## 2022-06-18 MED ORDER — CEFAZOLIN SODIUM-DEXTROSE 2-3 GM-%(50ML) IV SOLR
INTRAVENOUS | Status: DC | PRN
  Administered 2022-06-18: 2 g via INTRAVENOUS

## 2022-06-18 MED ORDER — POTASSIUM CHLORIDE 10 MEQ/100ML IV SOLN
10.0000 meq | INTRAVENOUS | Status: AC
  Administered 2022-06-18 (×4): 10 meq via INTRAVENOUS
  Filled 2022-06-18 (×2): qty 100

## 2022-06-18 MED ORDER — HYDROMORPHONE HCL 1 MG/ML IJ SOLN
INTRAMUSCULAR | Status: AC
  Filled 2022-06-18: qty 1

## 2022-06-18 MED ORDER — MIDAZOLAM HCL 5 MG/5ML IJ SOLN
INTRAMUSCULAR | Status: DC | PRN
  Administered 2022-06-18: 2 mg via INTRAVENOUS

## 2022-06-18 MED ORDER — ONDANSETRON HCL 4 MG/2ML IJ SOLN
INTRAMUSCULAR | Status: DC | PRN
  Administered 2022-06-18: 4 mg via INTRAVENOUS

## 2022-06-18 MED ORDER — PROPOFOL 10 MG/ML IV BOLUS
INTRAVENOUS | Status: DC | PRN
  Administered 2022-06-18: 130 mg via INTRAVENOUS

## 2022-06-18 MED ORDER — BUPIVACAINE HCL 0.25 % IJ SOLN: INTRAMUSCULAR | Status: DC | PRN

## 2022-06-18 SURGICAL SUPPLY — 45 items
ADH SKN CLS APL DERMABOND .7 (GAUZE/BANDAGES/DRESSINGS) ×1
APL PRP STRL LF DISP 70% ISPRP (MISCELLANEOUS) ×1
BAG COUNTER SPONGE SURGICOUNT (BAG) ×3 IMPLANT
BAG SPNG CNTER NS LX DISP (BAG) ×1
BLADE CLIPPER SURG (BLADE) IMPLANT
CANISTER SUCT 3000ML PPV (MISCELLANEOUS) IMPLANT
CHLORAPREP W/TINT 26 (MISCELLANEOUS) ×3 IMPLANT
COVER SURGICAL LIGHT HANDLE (MISCELLANEOUS) ×3 IMPLANT
DERMABOND ADVANCED (GAUZE/BANDAGES/DRESSINGS) ×1
DERMABOND ADVANCED .7 DNX12 (GAUZE/BANDAGES/DRESSINGS) ×2 IMPLANT
DEVICE SECURE STRAP 25 ABSORB (INSTRUMENTS) ×1 IMPLANT
DRAPE WARM FLUID 44X44 (DRAPES) ×3 IMPLANT
ELECT REM PT RETURN 9FT ADLT (ELECTROSURGICAL) ×2
ELECTRODE REM PT RTRN 9FT ADLT (ELECTROSURGICAL) ×2 IMPLANT
GLOVE BIO SURGEON STRL SZ7.5 (GLOVE) ×3 IMPLANT
GLOVE SURG SYN 7.5  E (GLOVE) ×2
GLOVE SURG SYN 7.5 E (GLOVE) ×1 IMPLANT
GLOVE SURG SYN 7.5 PF PI (GLOVE) ×2 IMPLANT
GOWN STRL REUS W/ TWL LRG LVL3 (GOWN DISPOSABLE) ×4 IMPLANT
GOWN STRL REUS W/ TWL XL LVL3 (GOWN DISPOSABLE) ×2 IMPLANT
GOWN STRL REUS W/TWL LRG LVL3 (GOWN DISPOSABLE) ×4
GOWN STRL REUS W/TWL XL LVL3 (GOWN DISPOSABLE) ×2
IRRIG SUCT STRYKERFLOW 2 WTIP (MISCELLANEOUS) ×2
IRRIGATION SUCT STRKRFLW 2 WTP (MISCELLANEOUS) IMPLANT
KIT BASIN OR (CUSTOM PROCEDURE TRAY) ×3 IMPLANT
KIT TURNOVER KIT B (KITS) ×3 IMPLANT
MESH VICRYL KNITTED 12X12 (Mesh General) ×1 IMPLANT
NDL INSUFFLATION 14GA 120MM (NEEDLE) ×2 IMPLANT
NEEDLE INSUFFLATION 14GA 120MM (NEEDLE) ×2 IMPLANT
NS IRRIG 1000ML POUR BTL (IV SOLUTION) ×3 IMPLANT
PAD ARMBOARD 7.5X6 YLW CONV (MISCELLANEOUS) ×6 IMPLANT
SCISSORS LAP 5X35 DISP (ENDOMECHANICALS) IMPLANT
SET IRRIG TUBING LAPAROSCOPIC (IRRIGATION / IRRIGATOR) IMPLANT
SET TUBE SMOKE EVAC HIGH FLOW (TUBING) ×3 IMPLANT
SLEEVE ENDOPATH XCEL 5M (ENDOMECHANICALS) ×3 IMPLANT
SUT MNCRL AB 4-0 PS2 18 (SUTURE) ×3 IMPLANT
TACKER 5MM HERNIA 3.5CML NAB (ENDOMECHANICALS) ×1 IMPLANT
TOWEL GREEN STERILE (TOWEL DISPOSABLE) ×3 IMPLANT
TOWEL GREEN STERILE FF (TOWEL DISPOSABLE) ×3 IMPLANT
TRAY LAPAROSCOPIC MC (CUSTOM PROCEDURE TRAY) ×3 IMPLANT
TROCAR XCEL 12X100 BLDLESS (ENDOMECHANICALS) IMPLANT
TROCAR XCEL BLUNT TIP 100MML (ENDOMECHANICALS) IMPLANT
TROCAR XCEL NON-BLD 11X100MML (ENDOMECHANICALS) IMPLANT
TROCAR XCEL NON-BLD 5MMX100MML (ENDOMECHANICALS) ×3 IMPLANT
WARMER LAPAROSCOPE (MISCELLANEOUS) ×3 IMPLANT

## 2022-06-18 NOTE — Op Note (Signed)
06/18/2022  12:49 PM  PATIENT:  David King  66 y.o. male  PRE-OPERATIVE DIAGNOSIS:  SMALL BOWEL OBSTRUCTION  POST-OPERATIVE DIAGNOSIS:  SMALL BOWEL OBSTRUCTION due to internal hernia in the retrorectus space due to peritoneal defect  PROCEDURE:  Procedure(s): LAPAROSCOPY DIAGNOSTIC (N/A) INSERTION OF MESH (N/A) REPAIR OF INTERNAL HERNIA (N/A)  SURGEON:  Surgeon(s) and Role:    Ralene Ok, MD - Primary  ANESTHESIA:   local and general  EBL:  minimal   BLOOD ADMINISTERED:none  DRAINS: none   LOCAL MEDICATIONS USED:  BUPIVICAINE   SPECIMEN:  No Specimen  DISPOSITION OF SPECIMEN:  N/A  COUNTS:  YES  TOURNIQUET:  * No tourniquets in log *  DICTATION: .Dragon Dictation  PLAN OF CARE: Admit to inpatient   Indication procedure: Patient is a 66 year old male who recently underwent robotic incisional hernia pair with mesh.  This was retrorectus and bilateral TAR.  Patient represented to the hospital secondary to signs of an ileus and SBO.  Patient went CT scan.  This was not convincing for a significant bowel obstruction and was treated conservatively for ileus.  Patient failed to progress.  We will proceed to the operating room for diagnostic laparoscopy.  Findings: Patient had some thin adhesions to the midline repair.  An area inferiorly there appeared to be a breach of the peritoneum.  This appeared to be below the arcuate line.  There was a portion of small bowel that was in the preperitoneal space.  This appeared to be the area of concern and with the internal hernia.  This was reduced.  The small bowel did not appear to be adherent to the mesh.  The mesh appeared to be on the posterior rectus wall.  This could easily be seen.  Small bowel appeared to be without injury.  Details of procedure: After the patient was consented he was taken back to the OR and placed in supine position with bilateral SCDs in place.  He underwent general endotracheal anesthesia.  He was  then prepped and draped in standard fashion.  A timeout is called all facts verified.  A Veress needle technique was used to insufflate the abdomen to 15 mmHg in the right subcostal margin.  Subsequent to this a 5 mm trocar and camera placed intra-abdominal he.  There is no injury to any intra-abdominal organs.  At this time 2x 32m trocars placed in the right anterior axillary line.  This was done under direct visualization.  At this time I slowly took down the omental adhesions to the midline closure.  This was done without incident.  I proceeded inferiorly.  At this time it was evident there was small bowel that was incarcerated into the preperitoneal space.  This was reduced easily.  I was able to visualize the small bowel appeared to be without injury or ischemia.  There was a large breach in the peritoneum.  This appeared to be below the arcuate line.  At this time the mesh was visualized behind the muscle belly of the rectus muscles.  A secure strap was then used to anchor the mesh inferiorly.  At this time a piece of Vicryl was cut to shape to overlap the area of breach.  This was tacked in superior and lateral borders, there was no tacks placed inferiorly as this was close to the bladder.  At this time the area was irrigated out.  The omentum was brought over the midline.  Trocars and insufflation was evacuated.  Skin was  reapproximated and 4 Monocryl subcuticular fashion.  The skin was dressed with Dermabond.  Patient tolerated procedure well was taken to the recovery in stable condition.    PATIENT DISPOSITION:  PACU - hemodynamically stable.   Delay start of Pharmacological VTE agent (>24hrs) due to surgical blood loss or risk of bleeding: not applicable

## 2022-06-18 NOTE — Progress Notes (Signed)
Subjective/Chief Complaint: Pt no better today Did have BM after supp KUBs reviewed   Objective: Vital signs in last 24 hours: Temp:  [98.1 F (36.7 C)-98.5 F (36.9 C)] 98.3 F (36.8 C) (06/20 0817) Pulse Rate:  [77-88] 86 (06/20 0817) Resp:  [16-20] 16 (06/20 0817) BP: (127-153)/(73-81) 139/81 (06/20 0817) SpO2:  [92 %-95 %] 92 % (06/20 0817) Last BM Date : 06/17/22  Intake/Output from previous day: 06/19 0701 - 06/20 0700 In: 1251.2 [I.V.:651.2; IV Piggyback:600] Out: 3300 [Emesis/NG output:3300] Intake/Output this shift: No intake/output data recorded.  General appearance: alert and cooperative GI: soft, non-tender; bowel sounds normal; no masses,  no organomegaly and inc c/d/i  Lab Results:  Recent Labs    06/17/22 0313 06/18/22 0341  WBC 10.1 12.2*  HGB 11.7* 11.5*  HCT 35.0* 34.4*  PLT 217 231   BMET Recent Labs    06/17/22 0313 06/18/22 0341  NA 138 137  K 3.3* 3.2*  CL 104 104  CO2 22 25  GLUCOSE 98 129*  BUN 16 15  CREATININE 0.84 0.84  CALCIUM 8.6* 8.7*   PT/INR No results for input(s): "LABPROT", "INR" in the last 72 hours. ABG No results for input(s): "PHART", "HCO3" in the last 72 hours.  Invalid input(s): "PCO2", "PO2"  Studies/Results: DG Abd Portable 1V  Result Date: 06/18/2022 CLINICAL DATA:  Postoperative ileus EXAM: PORTABLE ABDOMEN - 1 VIEW COMPARISON:  Previous studies including abdominal radiographs done on 06/17/2022 FINDINGS: Tip of NG tube is seen adjacent to the gastroesophageal junction. Side-port in the NG tube is seen in the lower thoracic esophagus. Stomach is not distended. There is dilation of proximal small bowel loops measuring up to 4.3 cm in diameter. There is contrast in the lumen of colon. Vascular stents are seen in the course of iliac arteries. No abnormal masses or calcifications are seen. Kidneys are partly obscured by bowel contents. IMPRESSION: There is dilation of proximal small bowel loops suggesting  partial small bowel obstruction. Tip of NG tube is seen in the medial aspect of fundus of the stomach. Side-port in the NG tube is seen in the lower thoracic esophagus. NG tube should be advanced 10 cm to place the tip and side port within the lumen of the stomach. Electronically Signed   By: Elmer Picker M.D.   On: 06/18/2022 09:49   DG Abd Portable 1V-Small Bowel Obstruction Protocol-initial, 8 hr delay  Result Date: 06/17/2022 CLINICAL DATA:  Encounter for bowel obstruction. EXAM: PORTABLE ABDOMEN - 1 VIEW COMPARISON:  X-ray abdomen 06/17/2022 FINDINGS: Partially visualized enteric tube with tip overlying the gastric lumen and side port collimated off view likely overlying the distal esophagus. PO contrast reaches the proximal sigmoid colon. Persistent small bowel dilatation measuring up to 3.8 cm. Limited evaluation for free gas on this supine radiograph. No radio-opaque calculi or other significant radiographic abnormality are seen. Bi-iliac stents noted. Lumbosacral surgical hardware.  And changes of the lumbar spine. IMPRESSION: 1. Query partial small bowel obstruction. 2. Partially visualized enteric tube with tip overlying the gastric lumen and side port collimated off view likely overlying the distal esophagus. Consider advancing by 3 cm. Electronically Signed   By: Iven Finn M.D.   On: 06/17/2022 21:44   DG Abd Portable 1V  Result Date: 06/17/2022 CLINICAL DATA:  Ileus. EXAM: PORTABLE ABDOMEN - 1 VIEW COMPARISON:  Radiographs 06/16/2022 and 06/14/2022.  CT 06/13/2022. FINDINGS: 0605 hours. Tip of the enteric tube is noted overlapping the proximal stomach. Unchanged moderate diffuse  small bowel distension. Contrast material is present within a decompressed colon. No extraluminal contrast or definite extraluminal intraperitoneal air is demonstrated. There is gas within the left lateral abdominal wall, as before. Previous iliac stenting and lower lumbar fusion are noted. IMPRESSION:  Unchanged bowel gas pattern with persistent small bowel distension and contrast within a decompressed colon. Findings remain suspicious for a partial small bowel obstruction. Electronically Signed   By: Richardean Sale M.D.   On: 06/17/2022 08:11   [place] EKG SITE RITE  Result Date: 06/17/2022 If Site Rite image not attached, placement could not be confirmed due to current cardiac rhythm.   Anti-infectives: Anti-infectives (From admission, onward)    Start     Dose/Rate Route Frequency Ordered Stop   06/13/22 2100  piperacillin-tazobactam (ZOSYN) IVPB 3.375 g        3.375 g 100 mL/hr over 30 Minutes Intravenous  Once 06/13/22 2055 06/13/22 2138       Assessment/Plan: 66 year old male postop day 7 from robotic incisional hernia pair with mesh. Ileus appears to not be changing much.  Will proceed to OR to rule out any intraabd issues. I d/w the risks and benefits with the patient to include but not limited MA:[id number], bleeding, damage to surrounding structures, possible need for further surgery.  Pt  voiced understanding and wishes to proceed.  1 cont' NGT 2.  Con't  TNA   LOS: 5 days    Ralene Ok 06/18/2022

## 2022-06-18 NOTE — Anesthesia Preprocedure Evaluation (Addendum)
Anesthesia Evaluation  Patient identified by MRN, date of birth, ID band Patient awake    Reviewed: Allergy & Precautions, NPO status , Patient's Chart, lab work & pertinent test results  Airway Mallampati: II  TM Distance: >3 FB Neck ROM: Full    Dental no notable dental hx. (+) Teeth Intact, Dental Advisory Given   Pulmonary COPD,  COPD inhaler, former smoker,    Pulmonary exam normal breath sounds clear to auscultation       Cardiovascular hypertension, Pt. 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   Neuro/Psych  Headaches, Anxiety  Neuromuscular disease    GI/Hepatic Neg liver ROS, Small Bowel Obstruction post op robotic ventral hernia repair 1 week ago   Endo/Other  hyperlipidemia  Renal/GU Renal disease   Prostate Ca S/P robotic prostatectomy    Musculoskeletal  (+) Arthritis , Osteoarthritis,  Plaque Psoriasis Low back pain with left sciatica   Abdominal   Peds  Hematology negative hematology ROS (+) Plavix therapy- last dose 6/15   Anesthesia Other Findings   Reproductive/Obstetrics                            Anesthesia Physical Anesthesia Plan  ASA: 3  Anesthesia Plan: General   Post-op Pain Management: Minimal or no pain anticipated, Dilaudid IV and Precedex   Induction: Rapid sequence, Intravenous and Cricoid pressure planned  PONV Risk Score and Plan: 4 or greater and Treatment may vary due to age or medical condition and Ondansetron  Airway Management Planned: Oral ETT  Additional Equipment: None  Intra-op Plan:   Post-operative Plan: Extubation in OR  Informed Consent: I have reviewed the patients History and Physical, chart, labs and discussed the procedure including the risks, benefits and alternatives for the  proposed anesthesia with the patient or authorized representative who has indicated his/her understanding and acceptance.     Dental advisory given  Plan Discussed with: CRNA and Anesthesiologist  Anesthesia Plan Comments:         Anesthesia Quick Evaluation

## 2022-06-18 NOTE — Anesthesia Procedure Notes (Signed)
Procedure Name: Intubation Date/Time: 06/18/2022 12:02 PM  Performed by: Alain Marion, CRNAPre-anesthesia Checklist: Patient identified, Emergency Drugs available, Suction available and Patient being monitored Patient Re-evaluated:Patient Re-evaluated prior to induction Oxygen Delivery Method: Circle System Utilized Preoxygenation: Pre-oxygenation with 100% oxygen Induction Type: IV induction and Rapid sequence Ventilation: Mask ventilation without difficulty Laryngoscope Size: Miller and 2 Grade View: Grade I Tube type: Oral Tube size: 7.5 mm Number of attempts: 1 Airway Equipment and Method: Stylet and Oral airway Placement Confirmation: ETT inserted through vocal cords under direct vision, positive ETCO2 and breath sounds checked- equal and bilateral Secured at: 21 cm Tube secured with: Tape Dental Injury: Teeth and Oropharynx as per pre-operative assessment

## 2022-06-18 NOTE — Progress Notes (Signed)
Initial Nutrition Assessment  DOCUMENTATION CODES:  Not applicable  INTERVENTION:  Advance diet as able post-op per surgery team Continue TPN to provide nutrition while PO intake is inadequate  NUTRITION DIAGNOSIS:  Increased nutrient needs related to post-op healing as evidenced by estimated needs.  GOAL:  Patient will meet greater than or equal to 90% of their needs  MONITOR:  Diet advancement, Skin, Weight trends, Labs, I & O's  REASON FOR ASSESSMENT:  Other (Comment) (New TPN)    ASSESSMENT:  Pt with hx of COPD, PVD, and hx of prostate cancer presented to ED with vomiting and abdominal pain after undergoing hernia repair 2 days prior. Found to have a post-op ileus   6/19 - TPN initiated 6/20 - Op, Laparoscopy, insertion of mesh and hernia repair  Pt out of room at the time of assessment for surgery. Noted that pt was started on TPN for prolonged NPO status with recent surgery. Discussed with pharmacy team and will provide nutrition recommendation. Pt to be advanced to full rate TPN tonight after tolerating initiation yesterday.   Pt was taken back to OR today after minimal improvement with conservative measures. Will follow-up with pt for nutrition interview and physical exam. Likely has a degree of acute malnutrition related to inadequate nutrition prior to TPN initiation.    Nutritionally Relevant Medications: Scheduled Meds:  insulin aspart  0-9 Units Subcutaneous Q6H   metoCLOPramide (REGLAN) injection  10 mg Intravenous Q6H   pantoprazole (PROTONIX) IV  40 mg Intravenous Daily   Continuous Infusions:  TPN ADULT (ION) 48 mL/hr at 06/17/22 1723   TPN ADULT (ION)     PRN Meds: diphenhydrAMINE, ondansetron, phenol, simethicone  Labs Reviewed: K 3.2  NUTRITION - FOCUSED PHYSICAL EXAM: Defer to in-person assessment  Diet Order:   Diet Order             Diet NPO time specified Except for: Ice Chips, Sips with Meds  Diet effective now                    EDUCATION NEEDS:  No education needs have been identified at this time  Skin:  Skin Assessment: Reviewed RN Assessment  Last BM:  6/19 - type 6  Height:  Ht Readings from Last 1 Encounters:  06/18/22 '5\' 9"'$  (1.753 m)    Weight:  Wt Readings from Last 1 Encounters:  06/18/22 88.9 kg    Ideal Body Weight:  72.7 kg  BMI:  Body mass index is 28.94 kg/m.  Estimated Nutritional Needs:  Kcal:  2300-2500 kcal/d Protein:  120-150g/d Fluid:  >/=2.3L/d    Ranell Patrick, RD, LDN Clinical Dietitian RD pager # available in [facility]  After hours/weekend pager # available in [facility]

## 2022-06-18 NOTE — Transfer of Care (Signed)
Immediate Anesthesia Transfer of Care Note  Patient: David King  Procedure(s) Performed: LAPAROSCOPY DIAGNOSTIC (Abdomen) INSERTION OF MESH (Abdomen) REPAIR OF INTERNAL HERNIA (Abdomen)  Patient Location: PACU  Anesthesia Type:General  Level of Consciousness: drowsy and patient cooperative  Airway & Oxygen Therapy: Patient Spontanous Breathing and Patient connected to face mask oxygen  Post-op Assessment: Report given to RN and Post -op Vital signs reviewed and stable  Post vital signs: Reviewed and stable  Last Vitals:  Vitals Value Taken Time  BP    Temp    Pulse    Resp    SpO2      Last Pain:  Vitals:   06/18/22 1111  TempSrc:   PainSc: 0-No pain      Patients Stated Pain Goal: 3 (35/36/14 4315)  Complications: No notable events documented.

## 2022-06-18 NOTE — Progress Notes (Signed)
PHARMACY - TOTAL PARENTERAL NUTRITION CONSULT NOTE   Indication: Prolonged ileus  Patient Measurements: HT 175.3 cm WT 90.5 kg BMI 29.45 Usual Weight: recent recorded as 90.5 kg (~100kg 11/2021)  Assessment:  40 YOM with hernia now s/p robotic TAR from 6/13, has not passed flatus or stool since surgery and presented to ED due to ongoing n/v all day not able to tolerate any PO. CT shows possible SBO and post-op changes. NPO since readmission 6/15.   Glucose / Insulin: CBGs 98-110, no insulin Electrolytes: k 3.3 (pending IV kcl10 x6), Na 138 up, CoCa 9.08, Mg 1.9, Phos 3.3, others wnl Renal: Scr 0.84 (@ bsl), LFTs Hepatic: 6/15 LFTs wnl (AST mildly elevated), alb 3.4, t bili 1.8 up  Intake / Output; MIVF: LBM charted 6/19, emesis/BG 2400 mL, UOP not charted  GI Imaging:  6/15 CT abdomen: subcutaneous gas within the abdominal wall and rectus sheath as well as a few punctate foci of free intraperitoneal gas, multiple dilated fluid-filled loops of proximal small bowel extending to a single point of transition within the mid pelvis anteriorly which may represent a partial or developing small bowel obstruction, severe distal colonic diverticulosis GI Surgeries / Procedures:  6/13 incisional hernia repair with mesh  Central access: PICC 6/19 TPN start date: 06/17/22  Nutritional Goals: Goal TPN rate is 113 mL/hr (provides 114g of protein and 2354 kcals per day)  RD Assessment: pending    Current Nutrition:  NPO  Plan:  Increase TPN to goal rate of 113 mL/hr at 1800 to provide 100% of needs (114 g AA, 2354 kcal per day)   Electrolytes in TPN: Na 411mq/L, K 539m/L, Ca 11m64mL, Mg 11mE23m, and Phos 111mm2m. Cl:Ac 1:1 Add standard MVI and trace elements to TPN Continue Sensitive q6h SSI and adjust as needed  IVF (D5/1/2NS + Kcl40) 10 mL/hr (TKO) IV Kcl 10 mEq x4  Monitor TPN labs on Mon/Thurs, additional labs as needed  Thank you for allowing pharmacy to be a part of this patient's  care.  AdrieArdyth HarpsrmD Clinical Pharmacist

## 2022-06-18 NOTE — Plan of Care (Signed)

## 2022-06-18 NOTE — Anesthesia Postprocedure Evaluation (Signed)
Anesthesia Post Note  Patient: David King  Procedure(s) Performed: LAPAROSCOPY DIAGNOSTIC (Abdomen) INSERTION OF MESH (Abdomen) REPAIR OF INTERNAL HERNIA (Abdomen)     Patient location during evaluation: PACU Anesthesia Type: General Level of consciousness: awake and alert and oriented Pain management: pain level controlled Vital Signs Assessment: post-procedure vital signs reviewed and stable Respiratory status: spontaneous breathing, nonlabored ventilation and respiratory function stable Cardiovascular status: blood pressure returned to baseline and stable Postop Assessment: no apparent nausea or vomiting Anesthetic complications: no   No notable events documented.  Last Vitals:  Vitals:   06/18/22 1430 06/18/22 1445  BP: 124/85 137/85  Pulse: (!) 109 (!) 103  Resp: 17 19  Temp:    SpO2: 99% 95%    Last Pain:  Vitals:   06/18/22 1430  TempSrc:   PainSc: Asleep                 Jazzmyn Filion A.

## 2022-06-19 ENCOUNTER — Encounter (HOSPITAL_COMMUNITY): Payer: Self-pay | Admitting: General Surgery

## 2022-06-19 LAB — BASIC METABOLIC PANEL
Anion gap: 10 (ref 5–15)
BUN: 15 mg/dL (ref 8–23)
CO2: 23 mmol/L (ref 22–32)
Calcium: 8.6 mg/dL — ABNORMAL LOW (ref 8.9–10.3)
Chloride: 101 mmol/L (ref 98–111)
Creatinine, Ser: 0.72 mg/dL (ref 0.61–1.24)
GFR, Estimated: >60 mL/min (ref >60)
Glucose, Bld: 126 mg/dL — ABNORMAL HIGH (ref 70–99)
Potassium: 3.6 mmol/L (ref 3.5–5.1)
Sodium: 134 mmol/L — ABNORMAL LOW (ref 135–145)

## 2022-06-19 LAB — GLUCOSE, CAPILLARY
Glucose-Capillary: 133 mg/dL — ABNORMAL HIGH (ref 70–99)
Glucose-Capillary: 126 mg/dL — ABNORMAL HIGH (ref 70–99)
Glucose-Capillary: 134 mg/dL — ABNORMAL HIGH (ref 70–99)

## 2022-06-19 LAB — CBC
HCT: 33.5 % — ABNORMAL LOW (ref 39.0–52.0)
Hemoglobin: 11.6 g/dL — ABNORMAL LOW (ref 13.0–17.0)
MCH: 34.1 pg — ABNORMAL HIGH (ref 26.0–34.0)
MCHC: 34.6 g/dL (ref 30.0–36.0)
MCV: 98.5 fL (ref 80.0–100.0)
Platelets: 240 10*3/uL (ref 150–400)
RBC: 3.4 MIL/uL — ABNORMAL LOW (ref 4.22–5.81)
RDW: 13.2 % (ref 11.5–15.5)
WBC: 14 10*3/uL — ABNORMAL HIGH (ref 4.0–10.5)
nRBC: 0 % (ref 0.0–0.2)

## 2022-06-19 MED ORDER — TRAVASOL 10 % IV SOLN
INTRAVENOUS | Status: DC
  Filled 2022-06-19: qty 1139

## 2022-06-19 NOTE — Progress Notes (Signed)
Patient removed his NG tube, when asked he said the tube is not doing him any good because there is no out put and it's hurting him. This RN tried to reinsert the tube but patient refused. Patient educated on the important of having the tube in place. On-call notified, no new order given. We continue to monitor.

## 2022-06-19 NOTE — Progress Notes (Signed)
   PHARMACY - TOTAL PARENTERAL NUTRITION CONSULT NOTE   Indication: Prolonged ileus  Patient Measurements: Height: '5\' 9"'$  (175.3 cm) Weight: 88.9 kg (196 lb) IBW/kg (Calculated) : 70.7 TPN AdjBW (KG): 75.3 Body mass index is 28.94 kg/m. Usual Weight: recent recorded as 90.5 kg (~100kg 11/2021)  Assessment:  29 YOM with hernia now s/p robotic TAR from 6/13, has not passed flatus or stool since surgery and presented to ED due to ongoing n/v all day not able to tolerate any PO. CT shows possible SBO and post-op changes. NPO since readmission 6/15.   Glucose / Insulin: CBGs 126-164, no insulin PTA, sSSI IP Electrolytes: k 3.6 (s/p IV kcl10 x4), Na 134, CoCa 9.88, Mg 1.9, Phos 3.3, others wnl Renal: Scr 0.72 (@ bsl), BUN wnl Hepatic: 6/15 LFTs wnl, alb 2.4, t bili 1.1 (back to bsl)  Intake / Output; MIVF: LBM charted 6/19, emesis/BG down to 900 mL, UOP not charted  GI Imaging:  6/15 CT abdomen: subcutaneous gas within the abdominal wall and rectus sheath as well as a few punctate foci of free intraperitoneal gas, multiple dilated fluid-filled loops of proximal small bowel extending to a single point of transition within the mid pelvis anteriorly which may represent a partial or developing small bowel obstruction, severe distal colonic diverticulosis GI Surgeries / Procedures:  6/13 incisional hernia repair with mesh 6/20 laparoscopy, repair of internal hernia  Central access: PICC 6/19 TPN start date: 06/17/22  Nutritional Goals: Goal TPN rate is 113 mL/hr (provides 114g of protein and 2354 kcals per day)  RD Assessment: Estimated Needs Total Energy Estimated Needs: 2300-2500 kcal/d Total Protein Estimated Needs: 120-150g/d Total Fluid Estimated Needs: >/=2.3L/d  Current Nutrition:  Clear liquids  Plan:  Continue TPN at goal rate of 113 mL/hr at 1800 to provide 100% of needs (114 g AA, 2354 kcal per day)   Electrolytes in TPN: Na 275mq/L, K 5475m/L, Ca 75m575mL, Mg 75mE68m, and  Phos 175mm75m. Cl:Ac 1:1 Add standard MVI and trace elements to TPN Continue Sensitive q6h SSI and adjust as needed  IVF (D5/1/2NS + Kcl40) 10 mL/hr (TKO) Monitor TPN labs on Mon/Thurs, additional labs as needed    Thank you for allowing pharmacy to be a part of this patient's care.  AdrieArdyth HarpsrmD Clinical Pharmacist

## 2022-06-19 NOTE — Progress Notes (Signed)
Mobility Specialist Progress Note   06/19/22 1038  Mobility  Activity Ambulated with assistance in hallway  Level of Assistance Standby assist, set-up cues, supervision of patient - no hands on  Assistive Device Front wheel walker  Distance Ambulated (ft) 550 ft  Activity Response Tolerated well  $Mobility charge 1 Mobility   During Mobility: 107 HR, 96% SpO2  Received in bed having soiled themselves, pt able to stand for ~8 mins while MS assisted in pericare prior to mobility session. Pt having no physical complaints before ambulation but c/o of general fatigue during, x1 seated break. Returned back to bed w/o fault, call bell in reach and all needs met.   Holland Falling Mobility Specialist Phone Number [phone]

## 2022-06-19 NOTE — Progress Notes (Signed)
1 Day Post-Op   Subjective/Chief Complaint: Pt feeling much better today D/w pt findings at surgery.    Objective: Vital signs in last 24 hours: Temp:  [97.4 F (36.3 C)-98.6 F (37 C)] 98.2 F (36.8 C) (06/21 0748) Pulse Rate:  [82-114] 91 (06/21 0748) Resp:  [16-21] 16 (06/21 0748) BP: (124-145)/(74-88) 127/82 (06/21 0748) SpO2:  [90 %-99 %] 91 % (06/21 0748) Weight:  [88.9 kg] 88.9 kg (06/20 1100) Last BM Date : 06/17/22  Intake/Output from previous day: 06/20 0701 - 06/21 0700 In: 2122 [I.V.:2122] Out: 50 [Blood:50] Intake/Output this shift: No intake/output data recorded.  General appearance: alert and cooperative GI: soft, non-tender; bowel sounds normal; no masses,  no organomegaly and inc c/d/i  Lab Results:  Recent Labs    06/17/22 0313 06/18/22 0341  WBC 10.1 12.2*  HGB 11.7* 11.5*  HCT 35.0* 34.4*  PLT 217 231   BMET Recent Labs    06/17/22 0313 06/18/22 0341  NA 138 137  K 3.3* 3.2*  CL 104 104  CO2 22 25  GLUCOSE 98 129*  BUN 16 15  CREATININE 0.84 0.84  CALCIUM 8.6* 8.7*   PT/INR No results for input(s): "LABPROT", "INR" in the last 72 hours. ABG No results for input(s): "PHART", "HCO3" in the last 72 hours.  Invalid input(s): "PCO2", "PO2"  Studies/Results: DG Abd Portable 1V  Result Date: 06/18/2022 CLINICAL DATA:  Postoperative ileus EXAM: PORTABLE ABDOMEN - 1 VIEW COMPARISON:  Previous studies including abdominal radiographs done on 06/17/2022 FINDINGS: Tip of NG tube is seen adjacent to the gastroesophageal junction. Side-port in the NG tube is seen in the lower thoracic esophagus. Stomach is not distended. There is dilation of proximal small bowel loops measuring up to 4.3 cm in diameter. There is contrast in the lumen of colon. Vascular stents are seen in the course of iliac arteries. No abnormal masses or calcifications are seen. Kidneys are partly obscured by bowel contents. IMPRESSION: There is dilation of proximal small bowel  loops suggesting partial small bowel obstruction. Tip of NG tube is seen in the medial aspect of fundus of the stomach. Side-port in the NG tube is seen in the lower thoracic esophagus. NG tube should be advanced 10 cm to place the tip and side port within the lumen of the stomach. Electronically Signed   By: Elmer Picker M.D.   On: 06/18/2022 09:49   DG Abd Portable 1V-Small Bowel Obstruction Protocol-initial, 8 hr delay  Result Date: 06/17/2022 CLINICAL DATA:  Encounter for bowel obstruction. EXAM: PORTABLE ABDOMEN - 1 VIEW COMPARISON:  X-ray abdomen 06/17/2022 FINDINGS: Partially visualized enteric tube with tip overlying the gastric lumen and side port collimated off view likely overlying the distal esophagus. PO contrast reaches the proximal sigmoid colon. Persistent small bowel dilatation measuring up to 3.8 cm. Limited evaluation for free gas on this supine radiograph. No radio-opaque calculi or other significant radiographic abnormality are seen. Bi-iliac stents noted. Lumbosacral surgical hardware.  And changes of the lumbar spine. IMPRESSION: 1. Query partial small bowel obstruction. 2. Partially visualized enteric tube with tip overlying the gastric lumen and side port collimated off view likely overlying the distal esophagus. Consider advancing by 3 cm. Electronically Signed   By: Iven Finn M.D.   On: 06/17/2022 21:44      Assessment/Plan: s/p Procedure(s): LAPAROSCOPY DIAGNOSTIC (N/A) INSERTION OF MESH (N/A) REPAIR OF INTERNAL HERNIA (N/A) Advance diet to clears Encourage mobilization Await return of bowel activity Hopefully home in next 2-3d  LOS: 6 days    Ralene Ok 06/19/2022

## 2022-06-20 LAB — COMPREHENSIVE METABOLIC PANEL
ALT: 71 U/L — ABNORMAL HIGH (ref 0–44)
AST: 63 U/L — ABNORMAL HIGH (ref 15–41)
Albumin: 2.1 g/dL — ABNORMAL LOW (ref 3.5–5.0)
Alkaline Phosphatase: 57 U/L (ref 38–126)
Anion gap: 9 (ref 5–15)
BUN: 16 mg/dL (ref 8–23)
CO2: 24 mmol/L (ref 22–32)
Calcium: 8.4 mg/dL — ABNORMAL LOW (ref 8.9–10.3)
Chloride: 101 mmol/L (ref 98–111)
Creatinine, Ser: 0.8 mg/dL (ref 0.61–1.24)
GFR, Estimated: >60 mL/min (ref >60)
Glucose, Bld: 128 mg/dL — ABNORMAL HIGH (ref 70–99)
Potassium: 3.8 mmol/L (ref 3.5–5.1)
Sodium: 134 mmol/L — ABNORMAL LOW (ref 135–145)
Total Bilirubin: 1 mg/dL (ref 0.3–1.2)
Total Protein: 5.5 g/dL — ABNORMAL LOW (ref 6.5–8.1)

## 2022-06-20 LAB — GLUCOSE, CAPILLARY
Glucose-Capillary: 125 mg/dL — ABNORMAL HIGH (ref 70–99)
Glucose-Capillary: 126 mg/dL — ABNORMAL HIGH (ref 70–99)
Glucose-Capillary: 128 mg/dL — ABNORMAL HIGH (ref 70–99)

## 2022-06-20 LAB — MAGNESIUM: Magnesium: 1.8 mg/dL (ref 1.7–2.4)

## 2022-06-20 LAB — PHOSPHORUS: Phosphorus: 3.5 mg/dL (ref 2.5–4.6)

## 2022-06-20 LAB — CBC
HCT: 31.3 % — ABNORMAL LOW (ref 39.0–52.0)
Hemoglobin: 10.4 g/dL — ABNORMAL LOW (ref 13.0–17.0)
MCH: 33.9 pg (ref 26.0–34.0)
MCHC: 33.2 g/dL (ref 30.0–36.0)
MCV: 102 fL — ABNORMAL HIGH (ref 80.0–100.0)
Platelets: 201 10*3/uL (ref 150–400)
RBC: 3.07 MIL/uL — ABNORMAL LOW (ref 4.22–5.81)
RDW: 13.3 % (ref 11.5–15.5)
WBC: 11.5 10*3/uL — ABNORMAL HIGH (ref 4.0–10.5)
nRBC: 0 % (ref 0.0–0.2)

## 2022-06-20 MED ORDER — PANTOPRAZOLE SODIUM 40 MG PO TBEC
40.0000 mg | DELAYED_RELEASE_TABLET | Freq: Every day | ORAL | Status: DC
Start: 2022-06-20 — End: 2022-06-21
  Administered 2022-06-20 – 2022-06-21 (×2): 40 mg via ORAL
  Filled 2022-06-20 (×2): qty 1

## 2022-06-20 MED ORDER — MAGNESIUM SULFATE 2 GM/50ML IV SOLN
2.0000 g | Freq: Once | INTRAVENOUS | Status: DC
Start: 2022-06-20 — End: 2022-06-20

## 2022-06-20 MED ORDER — POTASSIUM CHLORIDE 10 MEQ/50ML IV SOLN
10.0000 meq | INTRAVENOUS | Status: DC
  Filled 2022-06-20 (×2): qty 50

## 2022-06-20 NOTE — Progress Notes (Addendum)
Holding Reglan for now. Patient stated that he was going to wait awhile to eat and he would call me when he orders his lunch.  TPN was d/cd

## 2022-06-20 NOTE — Plan of Care (Signed)

## 2022-06-20 NOTE — Plan of Care (Signed)
  Problem: Education: Goal: Knowledge of General Education information will improve Description: Including pain rating scale, medication(s)/side effects and non-pharmacologic comfort measures Outcome: Progressing   Problem: Health Behavior/Discharge Planning: Goal: Ability to manage health-related needs will improve Outcome: Progressing   Problem: Clinical Measurements: Goal: Ability to maintain clinical measurements within normal limits will improve Outcome: Progressing Goal: Will remain free from infection Outcome: Progressing Goal: Diagnostic test results will improve Outcome: Progressing Goal: Respiratory complications will improve Outcome: Progressing Goal: Cardiovascular complication will be avoided Outcome: Progressing   Problem: Activity: Goal: Risk for activity intolerance will decrease Outcome: Progressing   Problem: Coping: Goal: Level of anxiety will decrease Outcome: Progressing   Problem: Elimination: Goal: Will not experience complications related to bowel motility Outcome: Progressing Goal: Will not experience complications related to urinary retention Outcome: Progressing   Problem: Pain Managment: Goal: General experience of comfort will improve Outcome: Progressing   Problem: Safety: Goal: Ability to remain free from injury will improve Outcome: Progressing   Problem: Skin Integrity: Goal: Risk for impaired skin integrity will decrease Outcome: Progressing   Problem: Education: Goal: Ability to describe self-care measures that may prevent or decrease complications (Diabetes Survival Skills Education) will improve Outcome: Progressing Goal: Individualized Educational Video(s) Outcome: Progressing   Problem: Fluid Volume: Goal: Ability to maintain a balanced intake and output will improve Outcome: Progressing   Problem: Health Behavior/Discharge Planning: Goal: Ability to identify and utilize available resources and services will  improve Outcome: Progressing Goal: Ability to manage health-related needs will improve Outcome: Progressing   Problem: Skin Integrity: Goal: Risk for impaired skin integrity will decrease Outcome: Progressing

## 2022-06-20 NOTE — Progress Notes (Signed)
2 Days Post-Op   Subjective/Chief Complaint: Doing well this AM Having BMs Tol PO   Objective: Vital signs in last 24 hours: Temp:  [98.8 F (37.1 C)-98.9 F (37.2 C)] 98.9 F (37.2 C) (06/22 0532) Pulse Rate:  [89-102] 94 (06/22 0532) Resp:  [16-18] 18 (06/22 0532) BP: (114-131)/(73-82) 114/73 (06/22 0532) SpO2:  [92 %-94 %] 92 % (06/22 0532) Last BM Date : 06/19/22  Intake/Output from previous day: 06/21 0701 - 06/22 0700 In: 1395.1 [I.V.:1395.1] Out: -  Intake/Output this shift: No intake/output data recorded.  General appearance: alert and cooperative GI: soft, non-tender; bowel sounds normal; no masses,  no organomegaly and inc c/d/i  Lab Results:  Recent Labs    06/19/22 0807 06/20/22 0255  WBC 14.0* 11.5*  HGB 11.6* 10.4*  HCT 33.5* 31.3*  PLT 240 201   BMET Recent Labs    06/19/22 0807 06/20/22 0255  NA 134* 134*  K 3.6 3.8  CL 101 101  CO2 23 24  GLUCOSE 126* 128*  BUN 15 16  CREATININE 0.72 0.80  CALCIUM 8.6* 8.4*   PT/INR No results for input(s): "LABPROT", "INR" in the last 72 hours. ABG No results for input(s): "PHART", "HCO3" in the last 72 hours.  Invalid input(s): "PCO2", "PO2"  Studies/Results: DG Abd Portable 1V  Result Date: 06/18/2022 CLINICAL DATA:  Postoperative ileus EXAM: PORTABLE ABDOMEN - 1 VIEW COMPARISON:  Previous studies including abdominal radiographs done on 06/17/2022 FINDINGS: Tip of NG tube is seen adjacent to the gastroesophageal junction. Side-port in the NG tube is seen in the lower thoracic esophagus. Stomach is not distended. There is dilation of proximal small bowel loops measuring up to 4.3 cm in diameter. There is contrast in the lumen of colon. Vascular stents are seen in the course of iliac arteries. No abnormal masses or calcifications are seen. Kidneys are partly obscured by bowel contents. IMPRESSION: There is dilation of proximal small bowel loops suggesting partial small bowel obstruction. Tip of NG tube  is seen in the medial aspect of fundus of the stomach. Side-port in the NG tube is seen in the lower thoracic esophagus. NG tube should be advanced 10 cm to place the tip and side port within the lumen of the stomach. Electronically Signed   By: Ernie Avena M.D.   On: 06/18/2022 09:49    Anti-infectives: Anti-infectives (From admission, onward)    Start     Dose/Rate Route Frequency Ordered Stop   06/13/22 2100  piperacillin-tazobactam (ZOSYN) IVPB 3.375 g        3.375 g 100 mL/hr over 30 Minutes Intravenous  Once 06/13/22 2055 06/13/22 2138       Assessment/Plan: s/p Procedure(s): LAPAROSCOPY DIAGNOSTIC (N/A) INSERTION OF MESH (N/A) REPAIR OF INTERNAL HERNIA (N/A) Advance diet to soft Con't to mobilize 1/2 TPN  and DC Hopefully home in AM if tol PO and con't to do well   LOS: 7 days    Axel Filler 06/20/2022

## 2022-06-20 NOTE — Progress Notes (Signed)
Mobility Specialist Progress Note:   06/20/22 1545  Mobility  Activity Ambulated with assistance in hallway  Level of Assistance Standby assist, set-up cues, supervision of patient - no hands on  Assistive Device Front wheel walker  Distance Ambulated (ft) 275 ft  Activity Response Tolerated well  $Mobility charge 1 Mobility   Pt agreeable to mobility session. Ambulated at supervision level with RW. Distance limited d/t fatigue. Pt back in bed with all needs met.   Nelta Numbers Acute [facility] Secure Chat or Office Phone: [phone]

## 2022-06-20 NOTE — Progress Notes (Signed)
Mobility Specialist Progress Note:   06/20/22 1435  Mobility  Activity Ambulated with assistance to bathroom  Level of Assistance Standby assist, set-up cues, supervision of patient - no hands on  Assistive Device Front wheel walker  Distance Ambulated (ft) 30 ft  Activity Response Tolerated well  $Mobility charge 1 Mobility   Pt requesting to go to BR. Ambulated at supervision level with RW.   Nelta Numbers Acute Rehab Secure Chat or Office Phone: [phone]

## 2022-06-20 NOTE — Progress Notes (Signed)
Adjusted rate of TPN to 55 at 1030 per order. Will turn off at 1230 per order.

## 2022-06-21 LAB — CBC
HCT: 32.8 % — ABNORMAL LOW (ref 39.0–52.0)
Hemoglobin: 11 g/dL — ABNORMAL LOW (ref 13.0–17.0)
MCH: 34 pg (ref 26.0–34.0)
MCHC: 33.5 g/dL (ref 30.0–36.0)
MCV: 101.2 fL — ABNORMAL HIGH (ref 80.0–100.0)
Platelets: 222 10*3/uL (ref 150–400)
RBC: 3.24 MIL/uL — ABNORMAL LOW (ref 4.22–5.81)
RDW: 13.4 % (ref 11.5–15.5)
WBC: 10.7 10*3/uL — ABNORMAL HIGH (ref 4.0–10.5)
nRBC: 0 % (ref 0.0–0.2)

## 2022-06-21 LAB — BASIC METABOLIC PANEL
Anion gap: 13 (ref 5–15)
BUN: 12 mg/dL (ref 8–23)
CO2: 25 mmol/L (ref 22–32)
Calcium: 8.7 mg/dL — ABNORMAL LOW (ref 8.9–10.3)
Chloride: 98 mmol/L (ref 98–111)
Creatinine, Ser: 0.76 mg/dL (ref 0.61–1.24)
GFR, Estimated: >60 mL/min (ref >60)
Glucose, Bld: 95 mg/dL (ref 70–99)
Potassium: 4 mmol/L (ref 3.5–5.1)
Sodium: 136 mmol/L (ref 135–145)

## 2022-06-24 ENCOUNTER — Telehealth: Payer: Self-pay | Admitting: Pulmonary Disease

## 2022-06-24 MED ORDER — TRELEGY ELLIPTA 100-62.5-25 MCG/ACT IN AEPB: 1.0000 | INHALATION_SPRAY | Freq: Every day | RESPIRATORY_TRACT | 5 refills | Status: DC

## 2022-06-24 NOTE — Telephone Encounter (Signed)
I called to verify that the patient wanted his medications sent to a mail order pharmacy and he did want them to [facility]. I have sent the refill to the pharmacy and a recall is placed for a follow up for this patient as well. Nothing further needed.

## 2022-07-29 ENCOUNTER — Telehealth: Payer: Self-pay | Admitting: Pulmonary Disease

## 2022-07-29 MED ORDER — TRELEGY ELLIPTA 100-62.5-25 MCG/ACT IN AEPB: 1.0000 | INHALATION_SPRAY | Freq: Every day | RESPIRATORY_TRACT | 5 refills | Status: DC

## 2022-07-29 NOTE — Telephone Encounter (Signed)
Called patient and verified medication and the pharmacy in which he wanted his refills sent into. Nothing further needed

## 2022-07-30 DIAGNOSIS — Z9889 Other specified postprocedural states: Secondary | ICD-10-CM | POA: Diagnosis not present

## 2022-07-30 DIAGNOSIS — R1030 Lower abdominal pain, unspecified: Secondary | ICD-10-CM | POA: Diagnosis not present

## 2022-07-30 DIAGNOSIS — Z8719 Personal history of other diseases of the digestive system: Secondary | ICD-10-CM | POA: Diagnosis not present

## 2022-07-31 ENCOUNTER — Ambulatory Visit: Payer: Medicare Other | Admitting: Podiatry

## 2022-07-31 ENCOUNTER — Encounter: Payer: Self-pay | Admitting: Podiatry

## 2022-07-31 DIAGNOSIS — B351 Tinea unguium: Secondary | ICD-10-CM

## 2022-07-31 DIAGNOSIS — M79674 Pain in right toe(s): Secondary | ICD-10-CM | POA: Diagnosis not present

## 2022-07-31 DIAGNOSIS — M79675 Pain in left toe(s): Secondary | ICD-10-CM

## 2022-08-01 ENCOUNTER — Other Ambulatory Visit: Payer: Self-pay | Admitting: General Surgery

## 2022-08-01 DIAGNOSIS — Z8719 Personal history of other diseases of the digestive system: Secondary | ICD-10-CM

## 2022-08-01 DIAGNOSIS — R1030 Lower abdominal pain, unspecified: Secondary | ICD-10-CM

## 2022-08-02 ENCOUNTER — Ambulatory Visit
Admission: RE | Admit: 2022-08-02 | Discharge: 2022-08-02 | Disposition: A | Discharge status: Home / Self Care | Payer: Medicare Other | Source: Ambulatory Visit | Attending: General Surgery | Admitting: General Surgery

## 2022-08-02 DIAGNOSIS — R1030 Lower abdominal pain, unspecified: Secondary | ICD-10-CM | POA: Diagnosis not present

## 2022-08-02 DIAGNOSIS — Z8719 Personal history of other diseases of the digestive system: Secondary | ICD-10-CM

## 2022-08-06 ENCOUNTER — Encounter: Payer: Self-pay | Admitting: Podiatry

## 2022-08-06 NOTE — Progress Notes (Signed)
  Subjective:  Patient ID: David King, male    DOB: January 25, 1956,  MRN: [id number]  David King presents to clinic today for painful elongated mycotic toenails 1-5 bilaterally which are tender when wearing enclosed shoe gear. Pain is relieved with periodic professional debridement.  New problem(s): None.   PCP is [name], MD , and last visit was  3 months ago.  No Known Allergies  Review of Systems: Negative except as noted in the HPI.  Objective: No changes noted in today's physical examination.  Objective:   Vascular Examination: Vascular status intact b/l with palpable pedal pulses. Pedal hair present b/l. CFT immediate b/l. No edema. No pain with calf compression b/l. Skin temperature gradient WNL b/l.   Neurological Examination: Sensation grossly intact b/l with 10 gram monofilament. Vibratory sensation intact b/l.   Dermatological Examination: Pedal skin with normal turgor, texture and tone b/l. Toenails 1-5 b/l thick, discolored, elongated with subungual debris and pain on dorsal palpation. No hyperkeratotic lesions noted b/l.   Musculoskeletal Examination: Muscle strength 5/5 to b/l LE. No pain, crepitus or joint limitation noted with ROM bilateral LE.  Radiographs: None  Last A1c:      Latest Ref Rng & Units 06/17/2022    3:13 AM  Hemoglobin A1C  Hemoglobin-A1c 4.8 - 5.6 % 5.3     Assessment/Plan: 1. Pain due to onychomycosis of toenails of both feet      -Patient was evaluated and treated. All patient's and/or POA's questions/concerns answered on today's visit. -Patient to continue soft, supportive shoe gear daily. -Toenails 1-5 b/l were debrided in length and girth with sterile nail nippers and dremel without iatrogenic bleeding.  -Patient/POA to call should there be question/concern in the interim.   Return in about 3 months (around 10/31/2022).  Marzetta Board, DPM

## 2022-08-13 DIAGNOSIS — L7634 Postprocedural seroma of skin and subcutaneous tissue following other procedure: Secondary | ICD-10-CM | POA: Diagnosis not present

## 2022-08-13 DIAGNOSIS — Z8719 Personal history of other diseases of the digestive system: Secondary | ICD-10-CM | POA: Diagnosis not present

## 2022-08-14 ENCOUNTER — Other Ambulatory Visit (HOSPITAL_COMMUNITY): Payer: Self-pay | Admitting: General Surgery

## 2022-08-14 ENCOUNTER — Other Ambulatory Visit: Payer: Self-pay | Admitting: General Surgery

## 2022-08-14 DIAGNOSIS — S301XXD Contusion of abdominal wall, subsequent encounter: Secondary | ICD-10-CM

## 2022-08-15 ENCOUNTER — Encounter: Payer: Self-pay | Admitting: *Deleted

## 2022-08-15 NOTE — Progress Notes (Unsigned)
Mir, David Libra, MD  Roosvelt Maser Approved.  Drain placement can be done with CT or US guidance.   Mir

## 2022-08-21 ENCOUNTER — Other Ambulatory Visit (HOSPITAL_COMMUNITY): Payer: Self-pay | Admitting: Physician Assistant

## 2022-08-21 ENCOUNTER — Other Ambulatory Visit: Payer: Self-pay | Admitting: Radiology

## 2022-08-21 DIAGNOSIS — Z01818 Encounter for other preprocedural examination: Secondary | ICD-10-CM

## 2022-08-22 ENCOUNTER — Encounter (HOSPITAL_COMMUNITY): Payer: Self-pay

## 2022-08-22 ENCOUNTER — Ambulatory Visit (HOSPITAL_COMMUNITY)
Admission: RE | Admit: 2022-08-22 | Discharge: 2022-08-22 | Disposition: A | Discharge status: Home / Self Care | Payer: Medicare Other | Source: Ambulatory Visit | Attending: General Surgery | Admitting: General Surgery

## 2022-08-22 DIAGNOSIS — Z01818 Encounter for other preprocedural examination: Secondary | ICD-10-CM | POA: Insufficient documentation

## 2022-08-22 DIAGNOSIS — Z9889 Other specified postprocedural states: Secondary | ICD-10-CM | POA: Insufficient documentation

## 2022-08-22 DIAGNOSIS — Y838 Other surgical procedures as the cause of abnormal reaction of the patient, or of later complication, without mention of misadventure at the time of the procedure: Secondary | ICD-10-CM | POA: Diagnosis not present

## 2022-08-22 DIAGNOSIS — Z7902 Long term (current) use of antithrombotics/antiplatelets: Secondary | ICD-10-CM | POA: Diagnosis not present

## 2022-08-22 DIAGNOSIS — M96843 Postprocedural seroma of a musculoskeletal structure following other procedure: Secondary | ICD-10-CM | POA: Insufficient documentation

## 2022-08-22 DIAGNOSIS — S301XXD Contusion of abdominal wall, subsequent encounter: Secondary | ICD-10-CM | POA: Diagnosis present

## 2022-08-22 DIAGNOSIS — L02211 Cutaneous abscess of abdominal wall: Secondary | ICD-10-CM | POA: Diagnosis not present

## 2022-08-22 LAB — PROTIME-INR
INR: 1.2 (ref 0.8–1.2)
Prothrombin Time: 15.2 seconds (ref 11.4–15.2)

## 2022-08-22 LAB — CBC
HCT: 39.8 % (ref 39.0–52.0)
Hemoglobin: 13.4 g/dL (ref 13.0–17.0)
MCH: 32 pg (ref 26.0–34.0)
MCHC: 33.7 g/dL (ref 30.0–36.0)
MCV: 95 fL (ref 80.0–100.0)
Platelets: 285 10*3/uL (ref 150–400)
RBC: 4.19 MIL/uL — ABNORMAL LOW (ref 4.22–5.81)
RDW: 13.4 % (ref 11.5–15.5)
WBC: 8.7 10*3/uL (ref 4.0–10.5)
nRBC: 0 % (ref 0.0–0.2)

## 2022-08-22 MED ORDER — SODIUM CHLORIDE 0.9% FLUSH: 5.0000 mL | Freq: Three times a day (TID) | INTRAVENOUS | Status: DC

## 2022-08-22 MED ORDER — FENTANYL CITRATE (PF) 100 MCG/2ML IJ SOLN
INTRAMUSCULAR | Status: AC
  Filled 2022-08-22: qty 2

## 2022-08-22 MED ORDER — FENTANYL CITRATE (PF) 100 MCG/2ML IJ SOLN
INTRAMUSCULAR | Status: AC | PRN
  Administered 2022-08-22 (×2): 50 ug via INTRAVENOUS

## 2022-08-22 MED ORDER — SODIUM CHLORIDE 0.9 % IV SOLN: INTRAVENOUS | Status: DC

## 2022-08-22 MED ORDER — MIDAZOLAM HCL 2 MG/2ML IJ SOLN
INTRAMUSCULAR | Status: AC | PRN
  Administered 2022-08-22 (×2): 1 mg via INTRAVENOUS

## 2022-08-22 MED ORDER — MIDAZOLAM HCL 2 MG/2ML IJ SOLN
INTRAMUSCULAR | Status: AC
  Filled 2022-08-22: qty 2

## 2022-08-22 MED ORDER — LIDOCAINE HCL 1 % IJ SOLN
10.0000 mL | Freq: Once | INTRAMUSCULAR | Status: AC
  Administered 2022-08-22: 10 mL via INTRADERMAL

## 2022-08-22 NOTE — H&P (Addendum)
Chief Complaint: Patient was seen in consultation today for abdominal seroma  Referring Physician(s): Ramirez,Armando  Supervising Physician: Corrie Mckusick  Patient Status: [facility] - Out-pt  History of Present Illness: David King is a 66 y.o. male with past medical history of abdominal hernia repair who developed a post-operative seroma.  He was seen in surgical follow-up 08/13/22 at which time he was referred to IR for drain placement. Case reviewed by Dr. Dwaine Gale who approves patient for procedure.   David King presents today in his usual state of health.  His abdominal incisions have healed well, however his belly remains distended and fluctuant with fluid. Complains of abdominal discomfort, pressure, difficulty urinating due to presence of fluid. Denies fever, chills, nausea, vomiting. He is agreeable to aspiration and drainage of seroma.   Past Medical History:  Diagnosis Date   Anxiety    new dx   Arthritis    lumbar   COPD (chronic obstructive pulmonary disease) (HCC)    Diverticulosis    Elevated PSA    Headache(784.0)    MIGRAINES   Iritis    CHRONIC IN LEFT EYE - SOME VISIAL IMPAIRMENT IN LEFT EYE   Neuromuscular disorder ([place])    left leg/foot,pinched siactic nerve   Pain    PAIN LEFT HIP AND DOWN LT LEG WITH NUMBNESS IN LEFT LEG--PT STATES SCIATIC NERVE IMPINGEMENT - PT PLANS BACK IN THE NEAR SURGERY.   Prostate cancer ([place]) 03/05/2013   Adenocarcinoma   Renal cysts, acquired, bilateral 03/19/2013   several simple , CT   Urinary frequency    AND NOCTURIA    Past Surgical History:  Procedure Laterality Date   BACK SURGERY     CARDIAC CATHETERIZATION     CATARACT EXTRACTION W/ INTRAOCULAR LENS IMPLANT Left    EYE SURGERY     RETINAL SURGERY LEFT EYE   HYDROCELE EXCISION Left 03/05/2013   Procedure: HYDROCELECTOMY ADULT;  Surgeon: Fredricka Bonine, MD;  Location: [facility];  Service: Urology;  Laterality: Left;   INCISIONAL HERNIA  REPAIR N/A 06/18/2022   Procedure: REPAIR OF INTERNAL HERNIA;  Surgeon: Ralene Ok, MD;  Location: [place];  Service: General;  Laterality: N/A;   INGUINAL HERNIA REPAIR Bilateral AGE 24   INSERTION OF MESH N/A 06/11/2022   Procedure: INSERTION OF MESH;  Surgeon: Ralene Ok, MD;  Location: [place];  Service: General;  Laterality: N/A;   INSERTION OF MESH N/A 06/18/2022   Procedure: INSERTION OF MESH;  Surgeon: Ralene Ok, MD;  Location: [place];  Service: General;  Laterality: N/A;   LAPAROSCOPY N/A 06/18/2022   Procedure: LAPAROSCOPY DIAGNOSTIC;  Surgeon: Ralene Ok, MD;  Location: [place];  Service: General;  Laterality: N/A;   LYMPHADENECTOMY Bilateral 05/06/2013   Procedure: Noel Journey;  Surgeon: Dutch Gray, MD;  Location: [facility];  Service: Urology;  Laterality: Bilateral;   PROSTATE BIOPSY N/A 03/05/2013   Procedure: PROSTATE BIOPSY and ultrasound;  Surgeon: Fredricka Bonine, MD;  Location: [facility];  Service: Urology;  Laterality: N/A;   REMOVAL BURSA Archer, LEFT ELBOW  1996   ROBOT ASSISTED LAPAROSCOPIC RADICAL PROSTATECTOMY N/A 05/06/2013   Procedure: ROBOTIC ASSISTED LAPAROSCOPIC RADICAL PROSTATECTOMY LEVEL 3;  Surgeon: Dutch Gray, MD;  Location: [facility];  Service: Urology;  Laterality: N/A;   TONSILLECTOMY     as achild   XI ROBOTIC ASSISTED VENTRAL HERNIA N/A 06/11/2022   Procedure: ROBOTIC INCISIONAL HERNIA REPAIR WITH MESH;  Surgeon: Ralene Ok, MD;  Location: [place];  Service: General;  Laterality: N/A;    Allergies: Patient has no known allergies.  Medications: Prior to Admission medications   Medication Sig Start Date End Date Taking? Authorizing Provider  albuterol (VENTOLIN HFA) 108 (90 Base) MCG/ACT inhaler USE 1-2 PUFFS EVERY 6 HOURS AS NEEDED FOR WHEEZING OR SHORTNESS OF BREATH. 01/03/22  Yes Mannam, Praveen, MD  clopidogrel (PLAVIX) 75 MG tablet Take 75 mg by mouth daily. 03/20/22  Yes [provider]   diphenhydramine-acetaminophen (TYLENOL PM EXTRA STRENGTH) 25-500 MG TABS tablet Take 3-4 tablets by mouth at bedtime.   Yes [provider]  Fluticasone-Umeclidin-Vilant (TRELEGY ELLIPTA) 100-62.5-25 MCG/ACT AEPB Inhale 1 puff into the lungs daily. 07/29/22  Yes Mannam, Praveen, MD  gabapentin (NEURONTIN) 300 MG capsule Take 600 mg by mouth 3 (three) times daily.   Yes [provider]  lisinopril (ZESTRIL) 20 MG tablet Take 20 mg by mouth daily. 09/29/21  Yes [provider]  omeprazole (PRILOSEC) 20 MG capsule Take 20 mg by mouth daily. 05/14/18  Yes [provider]  OVER THE COUNTER MEDICATION Apply 1 Application topically daily. [id number] psoriasis ointment   Yes [provider]  pravastatin (PRAVACHOL) 20 MG tablet Take 20 mg by mouth daily. 04/24/22  Yes [provider]  traMADol (ULTRAM) 50 MG tablet Take 1 tablet (50 mg total) by mouth every 6 (six) hours as needed. Patient not taking: Reported on 08/19/2022 06/12/22 06/12/23  Ralene Ok, MD     Family History  Problem Relation Age of Onset   Cancer Father        prostate cancer   Cancer Paternal Grandfather        prostate    Social History   Socioeconomic History   Marital status: Married    Spouse name: Not on file   Number of children: Not on file   Years of education: Not on file   Highest education level: Not on file  Occupational History   Not on file  Tobacco Use   Smoking status: Former    Packs/day: 2.00    Years: 47.00    Total pack years: 94.00    Types: Cigarettes    Quit date: 12/31/2016    Years since quitting: 5.6   Smokeless tobacco: Never   Tobacco comments:    Patient is currently smoke free since January 2018  Vaping Use   Vaping Use: Never used  Substance and Sexual Activity   Alcohol use: Yes    Alcohol/week: 14.0 standard drinks of alcohol    Types: 14 Shots of liquor per week   Drug use: No   Sexual activity: Never  Other Topics Concern    Not on file  Social History Narrative   Not on file   Social Determinants of Health   Financial Resource Strain: Not on file  Food Insecurity: Not on file  Transportation Needs: Not on file  Physical Activity: Not on file  Stress: Not on file  Social Connections: Not on file     Review of Systems: A 12 point ROS discussed and pertinent positives are indicated in the HPI above.  All other systems are negative.  Review of Systems  Constitutional:  Negative for fatigue and fever.  Respiratory:  Negative for cough and shortness of breath.   Cardiovascular:  Negative for chest pain.  Gastrointestinal:  Positive for abdominal distention and abdominal pain. Negative for nausea.  Musculoskeletal:  Negative for back pain.  Psychiatric/Behavioral:  Negative for behavioral problems and confusion.  Vital Signs: BP 124/81 (BP Location: Right Arm)   Pulse 81   Temp 97.8 F (36.6 C) (Temporal)   Resp 12   Ht '5\' 9"'$  (1.753 m)   Wt 190 lb (86.2 kg)   SpO2 100%   BMI 28.06 kg/m   Physical Exam Vitals and nursing note reviewed.  Constitutional:      General: He is not in acute distress.    Appearance: Normal appearance. He is not ill-appearing.  HENT:     Mouth/Throat:     Mouth: Mucous membranes are moist.     Pharynx: Oropharynx is clear.  Cardiovascular:     Rate and Rhythm: Normal rate and regular rhythm.  Pulmonary:     Effort: Pulmonary effort is normal. No respiratory distress.     Breath sounds: Normal breath sounds.  Abdominal:     General: There is distension.     Palpations: Abdomen is soft.     Tenderness: There is no abdominal tenderness. There is no guarding.  Skin:    General: Skin is warm and dry.  Neurological:     General: No focal deficit present.     Mental Status: He is alert and oriented to person, place, and time. Mental status is at baseline.  Psychiatric:        Mood and Affect: Mood normal.        Behavior: Behavior normal.      MD  Evaluation Airway: WNL Heart: WNL Abdomen: WNL Chest/ Lungs: WNL ASA  Classification: 3 Mallampati/Airway Score: Two   Imaging: CT ABDOMEN PELVIS WO CONTRAST  Result Date: 08/02/2022 CLINICAL DATA:  Lower abdominal pain, history of prior hernia repair 1 month ago EXAM: CT ABDOMEN AND PELVIS WITHOUT CONTRAST TECHNIQUE: Multidetector CT imaging of the abdomen and pelvis was performed following the standard protocol without IV contrast. RADIATION DOSE REDUCTION: This exam was performed according to the departmental dose-optimization program which includes automated exposure control, adjustment of the mA and/or kV according to patient size and/or use of iterative reconstruction technique. COMPARISON:  06/13/2022 FINDINGS: Lower chest: No acute abnormality. Hepatobiliary: Mild fatty infiltration of the liver is noted. The gallbladder is within normal limits. Pancreas: Unremarkable. No pancreatic ductal dilatation or surrounding inflammatory changes. Spleen: Multiple calcified granulomas are seen within the spleen. Adrenals/Urinary Tract: Renal glands are within normal limits. Kidneys demonstrate hypodensities bilaterally consistent with simple cysts stable in appearance from the prior exam. No follow-up is recommended. Multiple renal calculi are noted on the left to include a 13 mm stone in the left renal pelvis although no obstructive changes are seen. The ureters are within normal limits. Bladder is decompressed. Stomach/Bowel: Scattered diverticular change of the colon is noted without evidence of diverticulitis. No obstructive changes of the colon are seen. The appendix is within normal limits. Small bowel and stomach are within normal limits. Previously seen small bowel dilatation has resolved. Vascular/Lymphatic: Aortic atherosclerosis. No enlarged abdominal or pelvic lymph nodes. Iliac stents are again identified bilaterally. Reproductive: Prostate has been surgically removed. Other: In the anterior  aspect of the peritoneal cavity there is a large contained fluid collection identified which has increased in size significantly when compared with the prior exam. This extends across the entire abdomen measuring approximately 35 cm in width and approximately 3 cm in AP diameter. It extends from just below the liver margin to the level of the pubic symphysis. No associated bowel herniation is noted. Musculoskeletal: Postoperative and degenerative changes of lumbar spine are seen. IMPRESSION: Postsurgical changes  in the anterior abdomen with a large loculated fluid collection likely representing a postoperative seroma. This has increased significantly when compared with the prior exam. Nonobstructing left renal calculi as described. Diverticulosis without diverticulitis. These results will be called to the ordering clinician or representative by the Radiologist Assistant, and communication documented in the PACS or [employer]. Electronically Signed   By: Inez Catalina M.D.   On: 08/02/2022 15:29    Labs:  CBC: Recent Labs    06/19/22 0807 06/20/22 0255 06/21/22 0441 08/22/22 0833  WBC 14.0* 11.5* 10.7* 8.7  HGB 11.6* 10.4* 11.0* 13.4  HCT 33.5* 31.3* 32.8* 39.8  PLT 240 201 222 285    COAGS: Recent Labs    06/13/22 2055 08/22/22 0833  INR 1.1 1.2    BMP: Recent Labs    06/18/22 0341 06/19/22 0807 06/20/22 0255 06/21/22 0441  NA 137 134* 134* 136  K 3.2* 3.6 3.8 4.0  CL 104 101 101 98  CO2 '25 23 24 25  '$ GLUCOSE 129* 126* 128* 95  BUN '15 15 16 12  '$ CALCIUM 8.7* 8.6* 8.4* 8.7*  CREATININE 0.84 0.72 0.80 0.76  GFRNONAA >60 >60 >60 >60    LIVER FUNCTION TESTS: Recent Labs    06/10/22 0830 06/13/22 1950 06/18/22 0341 06/20/22 0255  BILITOT 0.7 1.8* 1.1 1.0  AST 28 43* 17 63*  ALT 28 38 18 71*  ALKPHOS 69 70 52 57  PROT 8.0 7.9 6.4* 5.5*  ALBUMIN 3.4* 3.4* 2.4* 2.1*    TUMOR MARKERS: No results for input(s): "AFPTM", "CEA", "CA199", "CHROMGRNA" in the last 8760  hours.  Assessment and Plan: Patient with past medical history of hernia repair presents with complaint of post-operative seroma.  IR consulted for abdominal drain at the request of Dr. Rosendo Gros. Case reviewed by Dr. Dwaine Gale who approves patient for procedure.  Patient presents today in their usual state of health.  He has been NPO and is not currently on blood thinners.  He has appropriately held his Plavix.   Risks and benefits discussed with the patient including bleeding, infection, damage to adjacent structures, bowel perforation/fistula connection, and sepsis.  All of the patient's questions were answered, patient is agreeable to proceed. Consent signed and in chart.  Thank you for this interesting consult.  I greatly enjoyed meeting David King and look forward to participating in their care.  A copy of this report was sent to the requesting provider on this date.  Electronically Signed: Docia Barrier, PA 08/22/2022, 9:35 AM   I spent a total of  30 Minutes   in face to face in clinical consultation, greater than 50% of which was counseling/coordinating care for abdominal seroma.

## 2022-08-27 LAB — AEROBIC/ANAEROBIC CULTURE W GRAM STAIN (SURGICAL/DEEP WOUND): Culture: NO GROWTH

## 2022-09-03 DIAGNOSIS — Z8719 Personal history of other diseases of the digestive system: Secondary | ICD-10-CM | POA: Diagnosis not present

## 2022-09-03 DIAGNOSIS — Z9889 Other specified postprocedural states: Secondary | ICD-10-CM | POA: Diagnosis not present

## 2022-09-05 ENCOUNTER — Other Ambulatory Visit (HOSPITAL_BASED_OUTPATIENT_CLINIC_OR_DEPARTMENT_OTHER): Payer: Self-pay | Admitting: General Surgery

## 2022-09-05 ENCOUNTER — Other Ambulatory Visit: Payer: Self-pay | Admitting: General Surgery

## 2022-09-05 DIAGNOSIS — Z8719 Personal history of other diseases of the digestive system: Secondary | ICD-10-CM

## 2022-09-09 ENCOUNTER — Ambulatory Visit (HOSPITAL_COMMUNITY)
Admission: RE | Admit: 2022-09-09 | Discharge: 2022-09-09 | Disposition: A | Discharge status: Home / Self Care | Payer: Medicare Other | Source: Ambulatory Visit | Attending: General Surgery | Admitting: General Surgery

## 2022-09-09 DIAGNOSIS — I700 Atherosclerosis of aorta: Secondary | ICD-10-CM | POA: Insufficient documentation

## 2022-09-09 DIAGNOSIS — Z8719 Personal history of other diseases of the digestive system: Secondary | ICD-10-CM

## 2022-09-09 DIAGNOSIS — Z9889 Other specified postprocedural states: Secondary | ICD-10-CM | POA: Insufficient documentation

## 2022-09-09 DIAGNOSIS — L928 Other granulomatous disorders of the skin and subcutaneous tissue: Secondary | ICD-10-CM | POA: Diagnosis not present

## 2022-09-09 DIAGNOSIS — K5730 Diverticulosis of large intestine without perforation or abscess without bleeding: Secondary | ICD-10-CM | POA: Diagnosis not present

## 2022-09-09 DIAGNOSIS — N200 Calculus of kidney: Secondary | ICD-10-CM | POA: Insufficient documentation

## 2022-09-09 DIAGNOSIS — N281 Cyst of kidney, acquired: Secondary | ICD-10-CM | POA: Diagnosis not present

## 2022-09-14 ENCOUNTER — Telehealth: Payer: Medicare Other | Admitting: Physician Assistant

## 2022-09-14 DIAGNOSIS — B999 Unspecified infectious disease: Secondary | ICD-10-CM

## 2022-09-14 MED ORDER — ONDANSETRON HCL 4 MG PO TABS: 4.0000 mg | ORAL_TABLET | Freq: Three times a day (TID) | ORAL | 0 refills | Status: DC | PRN

## 2022-09-14 MED ORDER — AMOXICILLIN-POT CLAVULANATE 875-125 MG PO TABS: 1.0000 | ORAL_TABLET | Freq: Two times a day (BID) | ORAL | 0 refills | Status: DC

## 2022-09-14 NOTE — Progress Notes (Signed)
Virtual Visit Consent   David King, you are scheduled for a virtual visit with a [facility] provider today. Just as with appointments in the office, your consent must be obtained to participate. Your consent will be active for this visit and any virtual visit you may have with one of our providers in the next 365 days. If you have a MyChart account, a copy of this consent can be sent to you electronically.  As this is a virtual visit, video technology does not allow for your provider to perform a traditional examination. This may limit your provider's ability to fully assess your condition. If your provider identifies any concerns that need to be evaluated in person or the need to arrange testing (such as labs, EKG, etc.), we will make arrangements to do so. Although advances in technology are sophisticated, we cannot ensure that it will always work on either your end or our end. If the connection with a video visit is poor, the visit may have to be switched to a telephone visit. With either a video or telephone visit, we are not always able to ensure that we have a secure connection.  By engaging in this virtual visit, you consent to the provision of healthcare and authorize for your insurance to be billed (if applicable) for the services provided during this visit. Depending on your insurance coverage, you may receive a charge related to this service.  I need to obtain your verbal consent now. Are you willing to proceed with your visit today? MAKIH King has provided verbal consent on 09/14/2022 for a virtual visit (video or telephone). Mar Daring, PA-C  Date: 09/14/2022 7:37 PM  Virtual Visit via Video Note   I, Mar Daring, connected with  David King  ([id number], 26-Oct-1956) on 09/14/22 at  7:30 PM EDT by a video-enabled telemedicine application and verified that I am speaking with the correct person using two identifiers.  Location: Patient: Virtual Visit Location  Patient: Home Provider: Virtual Visit Location Provider: Home Office   I discussed the limitations of evaluation and management by telemedicine and the availability of in person appointments. The patient expressed understanding and agreed to proceed.    History of Present Illness: David King is a 66 y.o. who identifies as a male who was assigned male at birth, and is being seen today for fever, nausea, weakness, dizziness.  HPI: URI  This is a new problem. The current episode started 1 to 4 weeks ago (2 weeks; symptoms started around 09/02/22). The problem has been gradually worsening. The maximum temperature recorded prior to his arrival was 101 - 101.9 F (101 yesterday). Associated symptoms include abdominal pain, diarrhea, headaches, nausea and vomiting. Pertinent negatives include no congestion, coughing, ear pain, plugged ear sensation, rhinorrhea, sinus pain or sore throat. Associated symptoms comments: dizziness. He has tried acetaminophen and increased fluids for the symptoms. The treatment provided no relief.   Passing gas and having diarrhea. Denies feeling like SBO he had in June 2023. Denies any worsening of URI symptoms, has chronic cough from COPD, but states not changed from baseline.   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 ([place]) 02/20/2022   Degeneration, intervertebral disc, lumbar 09/10/2021   Osteoarthritis of spine  with radiculopathy, lumbar region 09/10/2021   Low back pain 06/27/2021   Gastroesophageal reflux disease 06/15/2021   Hardening of the aorta (main artery of the heart) ([place]) 06/15/2021   History of malignant neoplasm of prostate 06/15/2021   Insomnia 06/15/2021   Vitamin D deficiency  06/15/2021   Essential hypertension 01/27/2020   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   Former smoker 08/05/2019   Pulmonary emphysema ([place]) 08/05/2019   Coronary artery disease involving native coronary artery of native heart without angina pectoris 03/06/2018   Unstable angina ([place]) 02/24/2018   Spinal stenosis of lumbar region 10/07/2013   Prostate cancer ([place])     Allergies: No Known Allergies Medications:  Current Outpatient Medications:    amoxicillin-clavulanate (AUGMENTIN) 875-125 MG tablet, Take 1 tablet by mouth 2 (two) times daily., Disp: 14 tablet, Rfl: 0   ondansetron (ZOFRAN) 4 MG tablet, Take 1 tablet (4 mg total) by mouth every 8 (eight) hours as needed for nausea or vomiting., Disp: 20 tablet, Rfl: 0   albuterol (VENTOLIN HFA) 108 (90 Base) MCG/ACT inhaler, USE 1-2 PUFFS EVERY 6 HOURS AS NEEDED FOR WHEEZING OR SHORTNESS OF BREATH., Disp: 8.5 g, Rfl: 2   clopidogrel (PLAVIX) 75 MG tablet, Take 75 mg by mouth daily., Disp: , Rfl:    diphenhydramine-acetaminophen (TYLENOL PM EXTRA STRENGTH) 25-500 MG TABS tablet, Take 3-4 tablets by mouth at bedtime., Disp: , Rfl:    Fluticasone-Umeclidin-Vilant (TRELEGY ELLIPTA) 100-62.5-25 MCG/ACT AEPB, Inhale 1 puff into the lungs daily., Disp: 60 each, Rfl: 5   gabapentin (NEURONTIN) 300 MG capsule, Take 600 mg by mouth 3 (three) times daily., Disp: , Rfl:    lisinopril (ZESTRIL) 20 MG tablet, Take 20 mg by mouth daily., Disp: , Rfl:    omeprazole (PRILOSEC) 20 MG capsule, Take 20 mg by mouth daily., Disp: , Rfl:    OVER THE COUNTER MEDICATION, Apply 1 Application topically daily. [id number] psoriasis ointment, Disp: , Rfl:    pravastatin (PRAVACHOL) 20 MG tablet, Take 20 mg by mouth daily., Disp: , Rfl:    traMADol (ULTRAM) 50 MG tablet, Take 1 tablet (50 mg total) by mouth every 6 (six) hours as needed. (Patient not taking: Reported on 08/19/2022), Disp: 20 tablet, Rfl: 0  Observations/Objective: Patient is well-developed, well-nourished in no acute  distress.  Resting comfortably at home.  Head is normocephalic, atraumatic.  No labored breathing.  Speech is clear and coherent with logical content.  Patient is alert and oriented at baseline.  Wife reports surgical incision is clean and looks good Denies URI symptoms being worse from baseline Able to pass gas, having loose stools  Assessment and Plan: 1. Infection - ondansetron (ZOFRAN) 4 MG tablet; Take 1 tablet (4 mg total) by mouth every 8 (eight) hours as needed for nausea or vomiting.  Dispense: 20 tablet; Refill: 0 - amoxicillin-clavulanate (AUGMENTIN) 875-125 MG tablet; Take 1 tablet by mouth 2 (two) times daily.  Dispense: 14 tablet; Refill: 0  - Having intermittent fevers, nausea, diarrhea, and dizziness over last 2 weeks since around 09/03/22 per wife - Unknown source of infection at this time with virtual evaluation, will start broad-spectrum antibiotics with Augmentin as noted above - Zofran for nausea - Continue to push fluids - [place] diet - Follow up in person with PCP or surgeon on Monday to have better evaluation to localize infection - Seek immediate care if symptoms worsen before Monday  Follow Up Instructions: I discussed the assessment and treatment plan with the patient. The patient was provided an opportunity  to ask questions and all were answered. The patient agreed with the plan and demonstrated an understanding of the instructions.  A copy of instructions were sent to the patient via MyChart unless otherwise noted below.    The patient was advised to call back or seek an in-person evaluation if the symptoms worsen or if the condition fails to improve as anticipated.  Time:  I spent 18 minutes with the patient via telehealth technology discussing the above problems/concerns.    Mar Daring, PA-C

## 2022-09-14 NOTE — Patient Instructions (Signed)
David King, thank you for joining Mar Daring, PA-C for today's virtual visit.  While this provider is not your primary care provider (PCP), if your PCP is located in our provider database this encounter information will be shared with them immediately following your visit.  Consent: (Patient) David King provided verbal consent for this virtual visit at the beginning of the encounter.  Current Medications:  Current Outpatient Medications:    amoxicillin-clavulanate (AUGMENTIN) 875-125 MG tablet, Take 1 tablet by mouth 2 (two) times daily., Disp: 14 tablet, Rfl: 0   ondansetron (ZOFRAN) 4 MG tablet, Take 1 tablet (4 mg total) by mouth every 8 (eight) hours as needed for nausea or vomiting., Disp: 20 tablet, Rfl: 0   albuterol (VENTOLIN HFA) 108 (90 Base) MCG/ACT inhaler, USE 1-2 PUFFS EVERY 6 HOURS AS NEEDED FOR WHEEZING OR SHORTNESS OF BREATH., Disp: 8.5 g, Rfl: 2   clopidogrel (PLAVIX) 75 MG tablet, Take 75 mg by mouth daily., Disp: , Rfl:    diphenhydramine-acetaminophen (TYLENOL PM EXTRA STRENGTH) 25-500 MG TABS tablet, Take 3-4 tablets by mouth at bedtime., Disp: , Rfl:    Fluticasone-Umeclidin-Vilant (TRELEGY ELLIPTA) 100-62.5-25 MCG/ACT AEPB, Inhale 1 puff into the lungs daily., Disp: 60 each, Rfl: 5   gabapentin (NEURONTIN) 300 MG capsule, Take 600 mg by mouth 3 (three) times daily., Disp: , Rfl:    lisinopril (ZESTRIL) 20 MG tablet, Take 20 mg by mouth daily., Disp: , Rfl:    omeprazole (PRILOSEC) 20 MG capsule, Take 20 mg by mouth daily., Disp: , Rfl:    OVER THE COUNTER MEDICATION, Apply 1 Application topically daily. [id number] psoriasis ointment, Disp: , Rfl:    pravastatin (PRAVACHOL) 20 MG tablet, Take 20 mg by mouth daily., Disp: , Rfl:    traMADol (ULTRAM) 50 MG tablet, Take 1 tablet (50 mg total) by mouth every 6 (six) hours as needed. (Patient not taking: Reported on 08/19/2022), Disp: 20 tablet, Rfl: 0   Medications ordered in this encounter:  Meds ordered this  encounter  Medications   ondansetron (ZOFRAN) 4 MG tablet    Sig: Take 1 tablet (4 mg total) by mouth every 8 (eight) hours as needed for nausea or vomiting.    Dispense:  20 tablet    Refill:  0    Order Specific Question:   Supervising Provider    Answer:   Chase Picket [id number]   amoxicillin-clavulanate (AUGMENTIN) 875-125 MG tablet    Sig: Take 1 tablet by mouth 2 (two) times daily.    Dispense:  14 tablet    Refill:  0    Order Specific Question:   Supervising Provider    Answer:   Chase Picket [id number]     *If you need refills on other medications prior to your next appointment, please contact your pharmacy*  Follow-Up: Call back or seek an in-person evaluation if the symptoms worsen or if the condition fails to improve as anticipated.   If you have been instructed to have an in-person evaluation today at a local Urgent Care facility, please use the link below. It will take you to a list of all of our available [facility], including address, phone number and hours of operation. Please do not delay care.  [facility]  If you or a family member do not have a primary care provider, use the link below to schedule a visit and establish care. When you choose a [facility] primary care physician or advanced  practice provider, you gain a long-term partner in health. Find a Primary Care Provider  Learn more about [facility]'s in-office and virtual care options: [facility]

## 2022-09-20 DIAGNOSIS — Z9889 Other specified postprocedural states: Secondary | ICD-10-CM | POA: Diagnosis not present

## 2022-09-20 DIAGNOSIS — Z8719 Personal history of other diseases of the digestive system: Secondary | ICD-10-CM | POA: Diagnosis not present

## 2022-09-25 DIAGNOSIS — Z9889 Other specified postprocedural states: Secondary | ICD-10-CM | POA: Diagnosis not present

## 2022-09-25 DIAGNOSIS — Z8719 Personal history of other diseases of the digestive system: Secondary | ICD-10-CM | POA: Diagnosis not present

## 2022-10-15 DIAGNOSIS — I739 Peripheral vascular disease, unspecified: Secondary | ICD-10-CM | POA: Diagnosis not present

## 2022-10-15 DIAGNOSIS — Z48812 Encounter for surgical aftercare following surgery on the circulatory system: Secondary | ICD-10-CM | POA: Diagnosis not present

## 2022-10-30 DIAGNOSIS — Z8719 Personal history of other diseases of the digestive system: Secondary | ICD-10-CM | POA: Diagnosis not present

## 2022-10-30 DIAGNOSIS — Z9889 Other specified postprocedural states: Secondary | ICD-10-CM | POA: Diagnosis not present

## 2022-11-06 ENCOUNTER — Other Ambulatory Visit: Payer: Self-pay | Admitting: General Surgery

## 2022-11-06 DIAGNOSIS — Z8719 Personal history of other diseases of the digestive system: Secondary | ICD-10-CM

## 2022-11-06 DIAGNOSIS — I739 Peripheral vascular disease, unspecified: Secondary | ICD-10-CM | POA: Diagnosis not present

## 2022-11-06 DIAGNOSIS — Z48812 Encounter for surgical aftercare following surgery on the circulatory system: Secondary | ICD-10-CM | POA: Diagnosis not present

## 2022-11-11 ENCOUNTER — Encounter: Payer: Self-pay | Admitting: Podiatry

## 2022-11-11 ENCOUNTER — Ambulatory Visit: Payer: Medicare Other | Admitting: Podiatry

## 2022-11-11 DIAGNOSIS — M79674 Pain in right toe(s): Secondary | ICD-10-CM

## 2022-11-11 DIAGNOSIS — I739 Peripheral vascular disease, unspecified: Secondary | ICD-10-CM

## 2022-11-11 DIAGNOSIS — M79675 Pain in left toe(s): Secondary | ICD-10-CM

## 2022-11-11 DIAGNOSIS — B351 Tinea unguium: Secondary | ICD-10-CM

## 2022-11-17 NOTE — Progress Notes (Signed)
Subjective:  Patient ID: David King, male    DOB: [birth year],  MRN: [id number]  David King presents to clinic today for at risk foot care. Patient has h/o PAD and painful elongated mycotic toenails 1-5 bilaterally which are tender when wearing enclosed shoe gear. Pain is relieved with periodic professional debridement.  Patient is followed by [facility] for PAD. Per Vascular, he is "s/p  left common femoral endarterectomy, bilateral common iliac artery stenting, and left external iliac artery stenting on 02/26/22.  He no longer has left calf cramps at night or left proximal inner thigh pain, but for the past 1-2 months has experienced bilateral foot cramping at night.  He has numbness of the left 2nd and 3rd digits he attributes to back issues; he denies symptoms associated with ambulation."  Patient relates no new problems on today's visit.  Chief Complaint  Patient presents with   Nail Problem    Routine foot care PCP-[name] Lona Kettle PCP VST- Few months ago  PCP is [name], MD.  No Known Allergies  Review of Systems: Negative except as noted in the HPI.  Objective: No changes noted in today's physical examination.  David King is a pleasant 66 y.o. male in NAD. AAO x 3.  Vascular Examination: Vascular status intact b/l with palpable pedal pulses. Pedal hair present b/l. CFT immediate b/l. No edema. No pain with calf compression b/l. Skin temperature gradient WNL b/l.   Neurological Examination: Sensation grossly intact b/l with 10 gram monofilament. Vibratory sensation intact b/l.   Dermatological Examination: Pedal skin with normal turgor, texture and tone b/l. Toenails 1-5 b/l thick, discolored, elongated with subungual debris and pain on dorsal palpation. No hyperkeratotic lesions noted b/l.   Musculoskeletal Examination: Muscle strength 5/5 to b/l LE. No pain, crepitus or joint limitation noted with ROM bilateral LE.  Radiographs: None  Outside  Procedure: [facility]) 10/15/2022 Procedures - documented in this encounter Procedures Procedure Name Priority Date/Time Associated Diagnosis Comments  VAS-ABI'S Pearla Dubonnet BILATERAL Routine 10/15/2022 3:08 PM EDT Peripheral artery disease (*)  Aftercare following surgery of the circulatory system  Results for this procedure are in the results section.    Imaging Results - documented in this encounter VAS-ABI's w/Ankle Waveforms Bilateral (10/15/2022 3:08 PM EDT) Imaging Results - VAS-ABI's w/Ankle Waveforms Bilateral (10/15/2022 3:08 PM EDT) Component Value Ref Range Test Method Analysis Time Performed At Pathologist Signature  Right upper PVR brachial pressure 91 mmHg     CPACS    Right posterior tibial 114 mmHg     CPACS    RDORPEDPRESSURE 113 mmHg     CPACS    Right ABI 1.25       CPACS    Right toe pressure 27 mmHg     CPACS    Right TBI 0.30       CPACS    Left upper PVR brachial pressure 91 mmHg     CPACS    Left posterior tibial 78 mmHg     CPACS    LDORPEDPRESSURE 76 mmHg     CPACS    Left ABI 0.86       CPACS    Left toe pressure 54 mmHg     CPACS    Left TBI 0.59       CPACS    Right Ankle Post2 Exercise Pressure 113.00 mmHg     CPACS    Right Ankle Post1 Exercise Pressure 113.00 mmHg  CPACS    Right Ankle Imm Post Exercise Pressure 113.00 mmHg     CPACS    Left Ankle Post2 Exercise Pressure 76.00 mmHg     CPACS    Left Ankle Post1 Exercise Pressure 76.00 mmHg     CPACS    Left Ankle Imm Post Exercise Pressure 76.00 mmHg     CPACS     Imaging Results - VAS-ABI's w/Ankle Waveforms Bilateral (10/15/2022 3:08 PM EDT) Anatomical Region Laterality Modality  Vascular, Thigh, Ankle   Ultrasound   Imaging Results - VAS-ABI's w/Ankle Waveforms Bilateral (10/15/2022 3:08 PM EDT) Specimen (Source) Anatomical Location / Laterality Collection Method / Volume Collection Time Received Time              Imaging Results - VAS-ABI's  w/Ankle Waveforms Bilateral (10/15/2022 3:08 PM EDT) Impressions  10/16/2022 5:25 PM EDT  Procedure: This exam consists of physiologic resting arterial pressures of the bilateral brachial and ankle arteries with continuous wave Doppler waveform analysis. The imaging is on file and stored in a permanent location.   Indication: The patient is S/P left common femoral endarterectomy, bilateral common iliac artery stenting, and left external iliac artery stenting on 02/26/22.  He no longer has left calf cramps at night or left proximal inner thigh pain, but for the past 1-2 months has experienced bilateral foot cramping at night.  He has numbness of the left 2nd and 3rd digits he attributes to back issues; he denies symptoms associated with ambulation.   Previous: [facility] exam performed on 03/26/22 demonstrated a right ABI of 1.07 with 1st digit pressure of 90 mmHg, and a left ABI of 0.73 with a 1st digit pressure of 79 mmHg.  Conclusion:  Right:  ABI = 1.25, 1st digit pressure = 27 mmHg  Left:  ABI = 0.86, 1st digit pressure = 54 mmHg     Assessment/Plan: 1. Pain due to onychomycosis of toenails of both feet   2. PAD (peripheral artery disease) ([place])     No orders of the defined types were placed in this encounter.   -Consent given for treatment as described below: -Examined patient. -Continue supportive shoe gear daily. -Mycotic toenails 1-5 bilaterally were debrided in length and girth with sterile nail nippers and dremel without incident. -Patient/POA to call should there be question/concern in the interim.   Return in about 4 months (around 03/12/2023).  Marzetta Board, DPM

## 2022-11-19 ENCOUNTER — Ambulatory Visit
Admission: RE | Admit: 2022-11-19 | Discharge: 2022-11-19 | Disposition: A | Discharge status: Home / Self Care | Payer: Medicare Other | Source: Ambulatory Visit | Attending: General Surgery | Admitting: General Surgery

## 2022-11-19 DIAGNOSIS — N200 Calculus of kidney: Secondary | ICD-10-CM | POA: Diagnosis not present

## 2022-11-19 DIAGNOSIS — Z8719 Personal history of other diseases of the digestive system: Secondary | ICD-10-CM

## 2022-11-19 DIAGNOSIS — K5730 Diverticulosis of large intestine without perforation or abscess without bleeding: Secondary | ICD-10-CM | POA: Diagnosis not present

## 2022-11-19 DIAGNOSIS — I714 Abdominal aortic aneurysm, without rupture, unspecified: Secondary | ICD-10-CM | POA: Diagnosis not present

## 2022-11-19 DIAGNOSIS — K7460 Unspecified cirrhosis of liver: Secondary | ICD-10-CM | POA: Diagnosis not present

## 2022-11-19 DIAGNOSIS — L400 Psoriasis vulgaris: Secondary | ICD-10-CM | POA: Diagnosis not present

## 2022-11-19 MED ORDER — IOPAMIDOL (ISOVUE-300) INJECTION 61%
100.0000 mL | Freq: Once | INTRAVENOUS | Status: AC | PRN
  Administered 2022-11-19: 100 mL via INTRAVENOUS

## 2022-12-05 DIAGNOSIS — M25559 Pain in unspecified hip: Secondary | ICD-10-CM | POA: Diagnosis not present

## 2022-12-11 DIAGNOSIS — M25559 Pain in unspecified hip: Secondary | ICD-10-CM | POA: Diagnosis not present

## 2022-12-13 DIAGNOSIS — M25559 Pain in unspecified hip: Secondary | ICD-10-CM | POA: Diagnosis not present

## 2022-12-18 DIAGNOSIS — Z961 Presence of intraocular lens: Secondary | ICD-10-CM | POA: Diagnosis not present

## 2022-12-18 DIAGNOSIS — H27112 Subluxation of lens, left eye: Secondary | ICD-10-CM | POA: Diagnosis not present

## 2022-12-19 DIAGNOSIS — T8529XA Other mechanical complication of intraocular lens, initial encounter: Secondary | ICD-10-CM | POA: Diagnosis not present

## 2022-12-25 DIAGNOSIS — M25559 Pain in unspecified hip: Secondary | ICD-10-CM | POA: Diagnosis not present

## 2022-12-27 DIAGNOSIS — M25559 Pain in unspecified hip: Secondary | ICD-10-CM | POA: Diagnosis not present

## 2022-12-31 DIAGNOSIS — M25559 Pain in unspecified hip: Secondary | ICD-10-CM | POA: Diagnosis not present

## 2023-01-02 DIAGNOSIS — M25559 Pain in unspecified hip: Secondary | ICD-10-CM | POA: Diagnosis not present

## 2023-01-08 DIAGNOSIS — M25559 Pain in unspecified hip: Secondary | ICD-10-CM | POA: Diagnosis not present

## 2023-01-10 DIAGNOSIS — M25559 Pain in unspecified hip: Secondary | ICD-10-CM | POA: Diagnosis not present

## 2023-01-11 DIAGNOSIS — M25559 Pain in unspecified hip: Secondary | ICD-10-CM | POA: Diagnosis not present

## 2023-01-15 DIAGNOSIS — M25559 Pain in unspecified hip: Secondary | ICD-10-CM | POA: Diagnosis not present

## 2023-01-17 DIAGNOSIS — M25559 Pain in unspecified hip: Secondary | ICD-10-CM | POA: Diagnosis not present

## 2023-01-22 DIAGNOSIS — M25559 Pain in unspecified hip: Secondary | ICD-10-CM | POA: Diagnosis not present

## 2023-01-24 DIAGNOSIS — M25559 Pain in unspecified hip: Secondary | ICD-10-CM | POA: Diagnosis not present

## 2023-01-29 DIAGNOSIS — M25559 Pain in unspecified hip: Secondary | ICD-10-CM | POA: Diagnosis not present

## 2023-02-03 ENCOUNTER — Telehealth: Payer: Self-pay | Admitting: Pulmonary Disease

## 2023-02-03 ENCOUNTER — Other Ambulatory Visit: Payer: Self-pay

## 2023-02-03 MED ORDER — TRELEGY ELLIPTA 100-62.5-25 MCG/ACT IN AEPB: 1.0000 | INHALATION_SPRAY | Freq: Every day | RESPIRATORY_TRACT | 5 refills | Status: DC

## 2023-02-03 NOTE — Telephone Encounter (Signed)
Refill for [facility]legy has [facility]. Patient is aware. Nothing further needed.

## 2023-02-05 DIAGNOSIS — M25559 Pain in unspecified hip: Secondary | ICD-10-CM | POA: Diagnosis not present

## 2023-02-17 ENCOUNTER — Ambulatory Visit: Payer: Medicare Other | Admitting: Pulmonary Disease

## 2023-02-25 DIAGNOSIS — M9902 Segmental and somatic dysfunction of thoracic region: Secondary | ICD-10-CM | POA: Diagnosis not present

## 2023-02-26 DIAGNOSIS — M9902 Segmental and somatic dysfunction of thoracic region: Secondary | ICD-10-CM | POA: Diagnosis not present

## 2023-03-04 ENCOUNTER — Ambulatory Visit
Admission: RE | Admit: 2023-03-04 | Discharge: 2023-03-04 | Disposition: A | Discharge status: Home / Self Care | Payer: Medicare Other | Source: Ambulatory Visit | Attending: Family Medicine | Admitting: Family Medicine

## 2023-03-04 ENCOUNTER — Other Ambulatory Visit: Payer: Self-pay | Admitting: Family Medicine

## 2023-03-04 DIAGNOSIS — M549 Dorsalgia, unspecified: Secondary | ICD-10-CM | POA: Diagnosis not present

## 2023-03-04 DIAGNOSIS — M546 Pain in thoracic spine: Secondary | ICD-10-CM | POA: Diagnosis not present

## 2023-03-05 ENCOUNTER — Ambulatory Visit: Payer: Medicare Other | Admitting: Pulmonary Disease

## 2023-03-05 ENCOUNTER — Encounter: Payer: Self-pay | Admitting: Pulmonary Disease

## 2023-03-05 VITALS — BP 142/68 | HR 76 | Temp 97.6°F | Ht 69.0 in | Wt 195.2 lb

## 2023-03-05 DIAGNOSIS — J4489 Other specified chronic obstructive pulmonary disease: Secondary | ICD-10-CM | POA: Diagnosis not present

## 2023-03-05 DIAGNOSIS — M25559 Pain in unspecified hip: Secondary | ICD-10-CM | POA: Diagnosis not present

## 2023-03-05 DIAGNOSIS — J439 Emphysema, unspecified: Secondary | ICD-10-CM

## 2023-03-05 DIAGNOSIS — Z87891 Personal history of nicotine dependence: Secondary | ICD-10-CM

## 2023-03-05 NOTE — Patient Instructions (Signed)
Glad you are doing well with your breathing Continue the inhalers as prescribed Will make a referral to smoking cessation program to resume annual CTs of the chest Follow-up in 1 year

## 2023-03-05 NOTE — Progress Notes (Signed)
David King    [id number]    [birth year]  Primary Care Physician:[name], Dwyane Luo, MD  Referring Physician: [name], MD [place],  [place] [address]  Chief complaint: Follow up for COPD, lung nodules  HPI: 67 y.o.  former smoker with history of psoriasis, prostate cancer, arthritis, anxiety, migraines, diverticulosis. He has complaints of progressive dyspnea on exertion for the past 10 months.  He is symptomatic with rest and as well.  Denies any cough, sputum production, wheezing. He was seen in the ED on 11/03/17 with an x-ray that was negative for any acute abnormality.  He did not have any interventions done.  Evaluated by cardiology at [facility] for unstable angina.  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  Quit in January 2018 Travel History: not significant  Interim history: Doing well with Trelegy inhaler.  No complaints today He stopped lung cancer screening as his current insurance apparently does not cover the procedure.  He is now on Medicare and wants to resume the scans  Had an episode of mild bronchitis a week ago that he treated with over-the-counter expectorant with improvement in symptoms.  Outpatient Encounter Medications as of 03/05/2023  Medication Sig   albuterol (VENTOLIN HFA) 108 (90 Base) MCG/ACT inhaler USE 1-2 PUFFS EVERY 6 HOURS AS NEEDED FOR WHEEZING OR SHORTNESS OF BREATH.   aspirin EC 325 MG tablet Take by mouth.   diphenhydramine-acetaminophen (TYLENOL PM EXTRA STRENGTH) 25-500 MG TABS tablet Take 3-4 tablets by mouth at bedtime.   Fluticasone-Umeclidin-Vilant (TRELEGY ELLIPTA) 100-62.5-25 MCG/ACT AEPB Inhale 1 puff into the lungs daily.   gabapentin (NEURONTIN) 300  MG capsule Take 600 mg by mouth 3 (three) times daily.   lisinopril (ZESTRIL) 20 MG tablet Take 20 mg by mouth daily.   omeprazole (PRILOSEC) 20 MG capsule Take 20 mg by mouth daily.   OVER THE COUNTER MEDICATION Apply 1 Application topically daily. [id number] psoriasis ointment   pravastatin (PRAVACHOL) 20 MG tablet Take 20 mg by mouth daily.   No facility-administered encounter medications on file as of 03/05/2023.   Physical Exam: Blood pressure (!) 142/68, pulse 76, temperature 97.6 F (36.4 C), temperature source Oral, height '5\' 9"'$  (1.753 m), weight 195 lb 3.2 oz (88.5 kg), SpO2 99 %. Gen:      No acute distress HEENT:  EOMI, sclera anicteric Neck:     No masses; no thyromegaly Lungs:    Clear to auscultation bilaterally; normal respiratory effort CV:         Regular rate and rhythm; no murmurs Abd:      + bowel sounds; soft, non-tender; no palpable masses, no distension Ext:    No edema; adequate peripheral perfusion Skin:      Warm and dry; no rash Neuro: alert and oriented x 3 Psych: normal mood and affect   Data Reviewed: Imaging CT abdomen 03/19/13-visualized lung bases are clear. Screening CT chest 11/14/17-mild emphysema with lower lobe bronchiectasis.  3.5 mm nodule in left upper lobe. Screening CT chest 11/18/2018- new irregular 7 mm nodule in the left upper lobe.  3.5 mm nodule is stable Screening CT chest 02/19/2019- 7 mm nodule previously identified has most completely resolved.  There is a new left upper  lobe nodule measuring 5.7 mm. Screening CT chest 08/26/2019- near resolution of left upper lobe nodule.  Additional pulmonary nodules are stable Screening CT chest 09/05/2020-emphysema, stable pulmonary nodule.  Bronchiectasis with bronchial wall thickening. I had reviewed the images personally.  PFTs 12/11/17 FVC 3.93 (87%], FEV1 2.67 [79%], F/F 68, TLC 113%, RV/TLC 147%, DLCO 78% Moderate obstruction with minimal diffusion defect.  Positive bronchodilator effect. Air  trapping.  Labs CBC 06/18/2018-WBC 8.4, eos 0.9%, absolute eosinophil count 76 Alpha-1 antitrypsin 06/26/2018-157, PI MM Blood allergy profile 06/26/2018-IgE 130, RAST panel negative  Assessment:  COPD PFTs reviewed which showed moderate- obstruction with bronchodilator response.   Continues on Trelegy inhaler.  Not clear to me if he needs inhaled steroids as peripheral eosinophils are low however he feels this works better for him than the Anoro.    GERD Continue on PPI   Pulmonary nodules He has not kept up with lung cancer screening.  He is now on Medicare and is willing to resume screening CTs.  Will make referral  Health maintenance Does not want influenza vaccine 02/23/2019-Pneumovax  Up-to-date with COVID-19 vaccination  Plan/Recommendations: - Continue Trelegy - Resume annual low-dose screening CT of the chest  [name] MD [facility] 03/05/2023, 1:09 PM  CC: [name], MD

## 2023-03-12 ENCOUNTER — Ambulatory Visit: Payer: Medicare Other | Admitting: Podiatry

## 2023-03-12 DIAGNOSIS — B351 Tinea unguium: Secondary | ICD-10-CM

## 2023-03-12 DIAGNOSIS — I739 Peripheral vascular disease, unspecified: Secondary | ICD-10-CM

## 2023-03-12 DIAGNOSIS — M79675 Pain in left toe(s): Secondary | ICD-10-CM | POA: Diagnosis not present

## 2023-03-12 DIAGNOSIS — M79674 Pain in right toe(s): Secondary | ICD-10-CM

## 2023-03-12 NOTE — Progress Notes (Signed)
  Subjective:  Patient ID: David King, male    DOB: [birth year],  MRN: [id number]  David King presents to clinic today for at risk foot care. Patient has h/o PAD and painful elongated mycotic toenails 1-5 bilaterally which are tender when wearing enclosed shoe gear. Pain is relieved with periodic professional debridement.  Chief Complaint  Patient presents with   Nail Problem    [facility] PCP-DavidAlan PCP VST-Last week   New problem(s): None.   PCP is David Cruel, MD.  No Known Allergies  Review of Systems: Negative except as noted in the HPI.  Objective: No changes noted in today's physical examination. There were no vitals filed for this visit. David King is a pleasant 67 y.o. male WD, WN in NAD. AAO x 3.  Vascular Examination: Vascular status intact b/l with palpable pedal pulses. Pedal hair present b/l. CFT immediate b/l. No edema. No pain with calf compression b/l. Skin temperature gradient WNL b/l.   Neurological Examination: Sensation grossly intact b/l with 10 gram monofilament. Vibratory sensation intact b/l.   Dermatological Examination: Pedal skin with normal turgor, texture and tone b/l. Toenails 1-5 b/l thick, discolored, elongated with subungual debris and pain on dorsal palpation. No hyperkeratotic lesions noted b/l.   Musculoskeletal Examination: Muscle strength 5/5 to b/l LE. No pain, crepitus or joint limitation noted with ROM bilateral LE.  Radiographs: None  Assessment/Plan: 1. Pain due to onychomycosis of toenails of both feet   2. PAD (peripheral artery disease) ([place])     -Patient was evaluated and treated. All patient's and/or POA's questions/concerns answered on today's visit. -Patient to continue soft, supportive shoe gear daily. -Toenails 1-5 b/l were debrided in length and girth with sterile nail nippers and dremel without iatrogenic bleeding.  -Patient/POA to call should there be question/concern in the interim.   Return in about 3  months (around 06/12/2023).  Marzetta Board, DPM

## 2023-03-14 DIAGNOSIS — M546 Pain in thoracic spine: Secondary | ICD-10-CM | POA: Diagnosis not present

## 2023-03-15 ENCOUNTER — Encounter: Payer: Self-pay | Admitting: Podiatry

## 2023-04-09 DIAGNOSIS — I700 Atherosclerosis of aorta: Secondary | ICD-10-CM | POA: Diagnosis not present

## 2023-04-09 DIAGNOSIS — E559 Vitamin D deficiency, unspecified: Secondary | ICD-10-CM | POA: Diagnosis not present

## 2023-04-09 DIAGNOSIS — I10 Essential (primary) hypertension: Secondary | ICD-10-CM | POA: Diagnosis not present

## 2023-04-14 DIAGNOSIS — K219 Gastro-esophageal reflux disease without esophagitis: Secondary | ICD-10-CM | POA: Diagnosis not present

## 2023-04-14 DIAGNOSIS — J439 Emphysema, unspecified: Secondary | ICD-10-CM | POA: Diagnosis not present

## 2023-04-14 DIAGNOSIS — M4850XD Collapsed vertebra, not elsewhere classified, site unspecified, subsequent encounter for fracture with routine healing: Secondary | ICD-10-CM | POA: Diagnosis not present

## 2023-04-14 DIAGNOSIS — I700 Atherosclerosis of aorta: Secondary | ICD-10-CM | POA: Diagnosis not present

## 2023-04-14 DIAGNOSIS — M549 Dorsalgia, unspecified: Secondary | ICD-10-CM | POA: Diagnosis not present

## 2023-04-14 DIAGNOSIS — Z0000 Encounter for general adult medical examination without abnormal findings: Secondary | ICD-10-CM | POA: Diagnosis not present

## 2023-04-14 DIAGNOSIS — I10 Essential (primary) hypertension: Secondary | ICD-10-CM | POA: Diagnosis not present

## 2023-04-14 DIAGNOSIS — M546 Pain in thoracic spine: Secondary | ICD-10-CM | POA: Diagnosis not present

## 2023-04-16 ENCOUNTER — Other Ambulatory Visit: Payer: Self-pay | Admitting: Family Medicine

## 2023-04-16 DIAGNOSIS — M4850XD Collapsed vertebra, not elsewhere classified, site unspecified, subsequent encounter for fracture with routine healing: Secondary | ICD-10-CM

## 2023-04-21 DIAGNOSIS — M546 Pain in thoracic spine: Secondary | ICD-10-CM | POA: Diagnosis not present

## 2023-04-23 ENCOUNTER — Inpatient Hospital Stay: Admission: RE | Admit: 2023-04-23 | Payer: Medicare Other | Source: Ambulatory Visit

## 2023-04-23 ENCOUNTER — Ambulatory Visit
Admission: RE | Admit: 2023-04-23 | Discharge: 2023-04-23 | Disposition: A | Discharge status: Home / Self Care | Payer: Medicare Other | Source: Ambulatory Visit | Attending: Family Medicine | Admitting: Family Medicine

## 2023-04-23 DIAGNOSIS — M4850XD Collapsed vertebra, not elsewhere classified, site unspecified, subsequent encounter for fracture with routine healing: Secondary | ICD-10-CM

## 2023-04-23 DIAGNOSIS — M85831 Other specified disorders of bone density and structure, right forearm: Secondary | ICD-10-CM | POA: Diagnosis not present

## 2023-05-05 DIAGNOSIS — M546 Pain in thoracic spine: Secondary | ICD-10-CM | POA: Diagnosis not present

## 2023-05-06 ENCOUNTER — Other Ambulatory Visit: Payer: Self-pay | Admitting: Orthopedic Surgery

## 2023-05-06 ENCOUNTER — Telehealth: Payer: Self-pay | Admitting: Pulmonary Disease

## 2023-05-06 NOTE — Telephone Encounter (Signed)
I have received the fax, I have given this over to [facility] for surgical clearance.

## 2023-05-06 NOTE — Telephone Encounter (Signed)
[facility] calling send a stat surgical clarence over spoke to Lupita Leash and needs it back asap pt. Has  a thoracic Lumbar fracture

## 2023-05-06 NOTE — Telephone Encounter (Signed)
Fax received from Dr. Estill Bamberg with [facility] to perform a Thoracic 7 Kyphoplasty on patient.  Patient needs surgery clearance. Surgery is 05/08/23. Patient was seen on 03/05/23. Office protocol is a risk assessment can be sent to surgeon if patient has been seen in 60 days or less.   Sending to Dr. Isaiah Serge for risk assessment or recommendations if patient needs to be seen in office prior to surgical procedure.

## 2023-05-07 ENCOUNTER — Encounter: Payer: Self-pay | Admitting: Pulmonary Disease

## 2023-05-07 ENCOUNTER — Other Ambulatory Visit: Payer: Self-pay

## 2023-05-07 ENCOUNTER — Encounter (HOSPITAL_COMMUNITY): Payer: Self-pay | Admitting: Orthopedic Surgery

## 2023-05-07 DIAGNOSIS — Z48812 Encounter for surgical aftercare following surgery on the circulatory system: Secondary | ICD-10-CM | POA: Diagnosis not present

## 2023-05-07 DIAGNOSIS — I739 Peripheral vascular disease, unspecified: Secondary | ICD-10-CM | POA: Diagnosis not present

## 2023-05-07 NOTE — Progress Notes (Signed)
PCP - Daisy Floro, MD  Cardiologist - pt can't remember MD name Pulmonology - [name]., MD  PPM/ICD - denies  Chest x-ray - 06/13/22 EKG - 06/15/22 Cardiac Cath - 03/06/18  CPAP - n/a  Fasting Blood Sugar - n/a  Blood Thinner Instructions: n/a Patient was instructed: As of today, STOP taking any Aspirin (unless otherwise instructed by your surgeon) Aleve, Naproxen, Ibuprofen, Motrin, Advil, Goody's, BC's, all herbal medications, fish oil, and all vitamins.   ERAS Protcol - yes, until 09:00 o'clock  COVID TEST- n/a  Anesthesia review: yes - surgical clearance  Patient verbally denies any shortness of breath, fever, cough and chest pain during phone call   -------------  SDW INSTRUCTIONS given:  Your procedure is scheduled on Thursday, May 9th, 2024.  Report to [facility] Main Entrance "A" at 09:00 A.M., and check in at the Admitting office.  Call this number if you have problems the morning of surgery:  [phone]   Remember:  Do not eat after midnight the night before your surgery  You may drink clear liquids until 09:00 the morning of your surgery.   Clear liquids allowed are: Water, Non-Citrus Juices (without pulp), Carbonated Beverages, Clear Tea, Black Coffee Only, and Gatorade    Take these medicines the morning of surgery with A SIP OF WATER: Gabapentin, Omeprazole, Pravastatin PRN: inhalers - Please bring all inhalers with you the day of surgery.     The day of surgery:                     Do not wear jewelry,             Do not wear lotions, powders, colognes, or deodorant.            Men may shave face and neck.            Do not bring valuables to the hospital.            [facility] is not responsible for any belongings or valuables.  Do NOT Smoke (Tobacco/Vaping) 24 hours prior to your procedure If you use a CPAP at night, you may bring all equipment for your overnight stay.   Contacts, glasses, dentures or bridgework may not be worn  into surgery.      For patients admitted to the hospital, discharge time will be determined by your treatment team.   Patients discharged the day of surgery will not be allowed to drive home, and someone needs to stay with them for 24 hours.    Special instructions:   [place]- Preparing For Surgery  Before surgery, you can play an important role. Because skin is not sterile, your skin needs to be as free of germs as possible. You can reduce the number of germs on your skin by washing with CHG (chlorahexidine gluconate) Soap before surgery.  CHG is an antiseptic cleaner which kills germs and bonds with the skin to continue killing germs even after washing.    Oral Hygiene is also important to reduce your risk of infection.  Remember - BRUSH YOUR TEETH THE MORNING OF SURGERY WITH YOUR REGULAR TOOTHPASTE  Please do not use if you have an allergy to CHG or antibacterial soaps. If your skin becomes reddened/irritated stop using the CHG.  Do not shave (including legs and underarms) for at least 48 hours prior to first CHG shower. It is OK to shave your face.  Please follow these instructions carefully.  Shower the NIGHT BEFORE SURGERY and the MORNING OF SURGERY with DIAL Soap.   Pat yourself dry with a CLEAN TOWEL.  Wear CLEAN PAJAMAS to bed the night before surgery  Place CLEAN SHEETS on your bed the night of your first shower and DO NOT SLEEP WITH PETS.   Day of Surgery: Please shower morning of surgery  Wear Clean/Comfortable clothing the morning of surgery Do not apply any deodorants/lotions.   Remember to brush your teeth WITH YOUR REGULAR TOOTHPASTE.   Questions were answered. Patient verbalized understanding of instructions.

## 2023-05-07 NOTE — Telephone Encounter (Signed)
OV notes and clearance form have been faxed back to [facility]. Nothing further needed at this time.  

## 2023-05-07 NOTE — Anesthesia Preprocedure Evaluation (Signed)
Anesthesia Evaluation  Patient identified by MRN, date of birth, ID band Patient awake    Reviewed: Allergy & Precautions, NPO status , Patient's Chart, lab work & pertinent test results  Airway Mallampati: II  TM Distance: >3 FB Neck ROM: Full    Dental no notable dental hx.    Pulmonary COPD,  COPD inhaler, former smoker   Pulmonary exam normal        Cardiovascular hypertension, Pt. on medications + CAD and + Peripheral Vascular Disease   Rhythm:Regular Rate:Normal     Neuro/Psych  Headaches  Anxiety        GI/Hepatic Neg liver ROS,GERD  Medicated,,  Endo/Other  negative endocrine ROS    Renal/GU   negative genitourinary   Musculoskeletal  (+) Arthritis , Osteoarthritis,    Abdominal Normal abdominal exam  (+)   Peds  Hematology negative hematology ROS (+)   Anesthesia Other Findings   Reproductive/Obstetrics                             Anesthesia Physical Anesthesia Plan  ASA: 3  Anesthesia Plan: General   Post-op Pain Management: Tylenol PO (pre-op)* and Celebrex PO (pre-op)*   Induction: Intravenous  PONV Risk Score and Plan: 2 and Ondansetron, Dexamethasone, Midazolam and Treatment may vary due to age or medical condition  Airway Management Planned: Mask and Oral ETT  Additional Equipment: None  Intra-op Plan:   Post-operative Plan: Extubation in OR  Informed Consent: I have reviewed the patients History and Physical, chart, labs and discussed the procedure including the risks, benefits and alternatives for the proposed anesthesia with the patient or authorized representative who has indicated his/her understanding and acceptance.     Dental advisory given  Plan Discussed with: CRNA  Anesthesia Plan Comments: (PAT note by Antionette Poles, PA-C: Follows with vascular surgery at [facility] for history of PAD s/p left common femoral endarterectomy and bilateral common iliac  artery stents and a left external iliac artery stent on 02/26/2022.  Last seen by Dr. Reather Converse on 05/07/2023 for follow-up.  Per note, he was stable at that time, recommended follow-up on a yearly basis.  Follows with pulmonology for history of moderate COPD.  Preop risk stratification and progress note by Dr. Isaiah Serge 05/07/2023, "Patient has moderate COPD, quit smoking in January 2018.  Symptoms are stable on Trelegy inhaler.  He is not on supplemental oxygen.  Patient is at low risk for perioperative pulmonary complication per ARISCAT calculator with 1.6% pulmonary complication rate.  There are no pulmonary contraindications to surgery. "  Pt will need DOS labs and eval.  EKG 06/15/2022: NSR.  Rate 95.  Possible LAE.  Nonspecific ST and T wave abnormality.  Cardiac cath 03/10/2018 (Care Everywhere):  High left ventricle end diastolic pressure.   Non-obstructive coronary artery disease.   Maximize medical therapy.   Discussed with patient (but may still be drowsy).   Discussed with family:   LCP for CCS3 angina   L dom, large LCX territory. Mild non obs CAD. LAD is a small vessel - no  obstructive disease   RCA small, ND.   LVEDP elevated.    )        Anesthesia Quick Evaluation

## 2023-05-07 NOTE — Progress Notes (Signed)
Pulmonary pre op evaluation note  Fax received from Dr. Estill Bamberg with [name] to perform a Thoracic 7 Kyphoplasty on patient.  Patient needs surgery clearance. Surgery is on 05/08/23  Patient has moderate COPD, quit smoking in January 2018.  Symptoms are stable on Trelegy inhaler.  He is not on supplemental oxygen.  Patient is at low risk for perioperative pulmonary complication per ARISCAT calculator with 1.6% pulmonary complication rate.  There are no pulmonary contraindications to surgery.  Peri-operative Assessment of Pulmonary Risk for Non-Thoracic Surgery:  David King, risk of perioperative pulmonary complications is increased by:  Age greater than 65 years  COPD  Respiratory complications generally occur in 1% of ASA Class I patients, 5% of ASA Class II and 10% of ASA Class III-IV patients These complications rarely result in mortality and iclude postoperative pneumonia, atelectasis, pulmonary embolism, ARDS and increased time requiring postoperative mechanical ventilation.  Overall, I recommend proceeding with the surgery if the risk for respiratory complications are outweighed by the potential benefits. This will need to be discussed between the patient and surgeon.  To reduce risks of respiratory complications, I recommend: --Pre- and post-operative incentive spirometry performed frequently while awake --Avoiding use of pancuronium during anesthesia.  [name] MD [facility] See Amion for pager  If no response to pager , please call [phone] until 7pm After 7:00 pm call Elink  [phone] 05/07/2023, 9:08 AM

## 2023-05-07 NOTE — Progress Notes (Signed)
Anesthesia Chart Review: Same-day workup  Follows with vascular surgery at [facility] for history of PAD s/p left common femoral endarterectomy and bilateral common iliac artery stents and a left external iliac artery stent on 02/26/2022.  Last seen by Dr. Reather Converse on 05/07/2023 for follow-up.  Per note, he was stable at that time, recommended follow-up on a yearly basis.  Follows with pulmonology for history of moderate COPD.  Preop risk stratification and progress note by Dr. Isaiah Serge 05/07/2023, "Patient has moderate COPD, quit smoking in January 2018.  Symptoms are stable on Trelegy inhaler.  He is not on supplemental oxygen.  Patient is at low risk for perioperative pulmonary complication per ARISCAT calculator with 1.6% pulmonary complication rate.  There are no pulmonary contraindications to surgery. "  Pt will need DOS labs and eval.  EKG 06/15/2022: NSR.  Rate 95.  Possible LAE.  Nonspecific ST and T wave abnormality.  Cardiac cath 03/10/2018 (Care Everywhere):  High left ventricle end diastolic pressure.   Non-obstructive coronary artery disease.   Maximize medical therapy.   Discussed with patient (but may still be drowsy).   Discussed with family:   LCP for CCS3 angina   L dom, large LCX territory. Mild non obs CAD. LAD is a small vessel - no  obstructive disease   RCA small, ND.   LVEDP elevated.     Zannie Cove [facility]/Anesthesiology Phone [phone] 05/07/2023 3:52 PM

## 2023-05-07 NOTE — Telephone Encounter (Signed)
Please see separate note with preop evaluation.

## 2023-05-08 ENCOUNTER — Ambulatory Visit (HOSPITAL_COMMUNITY): Payer: Medicare Other

## 2023-05-08 ENCOUNTER — Other Ambulatory Visit: Payer: Self-pay

## 2023-05-08 ENCOUNTER — Encounter (HOSPITAL_COMMUNITY)
Admission: RE | Disposition: A | Discharge status: Home / Self Care | Payer: Self-pay | Source: Home / Self Care | Attending: Orthopedic Surgery

## 2023-05-08 ENCOUNTER — Ambulatory Visit (HOSPITAL_COMMUNITY): Payer: Medicare Other | Admitting: Physician Assistant

## 2023-05-08 ENCOUNTER — Ambulatory Visit (HOSPITAL_BASED_OUTPATIENT_CLINIC_OR_DEPARTMENT_OTHER): Payer: Medicare Other | Admitting: Physician Assistant

## 2023-05-08 ENCOUNTER — Encounter (HOSPITAL_COMMUNITY): Payer: Self-pay | Admitting: Orthopedic Surgery

## 2023-05-08 ENCOUNTER — Ambulatory Visit (HOSPITAL_COMMUNITY)
Admission: RE | Admit: 2023-05-08 | Discharge: 2023-05-08 | Disposition: A | Discharge status: Home / Self Care | Payer: Medicare Other | Attending: Orthopedic Surgery | Admitting: Orthopedic Surgery

## 2023-05-08 DIAGNOSIS — M1990 Unspecified osteoarthritis, unspecified site: Secondary | ICD-10-CM | POA: Diagnosis not present

## 2023-05-08 DIAGNOSIS — Z87891 Personal history of nicotine dependence: Secondary | ICD-10-CM | POA: Insufficient documentation

## 2023-05-08 DIAGNOSIS — Z7951 Long term (current) use of inhaled steroids: Secondary | ICD-10-CM | POA: Insufficient documentation

## 2023-05-08 DIAGNOSIS — Z79899 Other long term (current) drug therapy: Secondary | ICD-10-CM | POA: Insufficient documentation

## 2023-05-08 DIAGNOSIS — I2510 Atherosclerotic heart disease of native coronary artery without angina pectoris: Secondary | ICD-10-CM | POA: Diagnosis not present

## 2023-05-08 DIAGNOSIS — K219 Gastro-esophageal reflux disease without esophagitis: Secondary | ICD-10-CM | POA: Insufficient documentation

## 2023-05-08 DIAGNOSIS — I10 Essential (primary) hypertension: Secondary | ICD-10-CM | POA: Insufficient documentation

## 2023-05-08 DIAGNOSIS — S22000A Wedge compression fracture of unspecified thoracic vertebra, initial encounter for closed fracture: Secondary | ICD-10-CM

## 2023-05-08 DIAGNOSIS — M8088XA Other osteoporosis with current pathological fracture, vertebra(e), initial encounter for fracture: Secondary | ICD-10-CM

## 2023-05-08 DIAGNOSIS — J449 Chronic obstructive pulmonary disease, unspecified: Secondary | ICD-10-CM | POA: Diagnosis not present

## 2023-05-08 DIAGNOSIS — Z981 Arthrodesis status: Secondary | ICD-10-CM | POA: Diagnosis not present

## 2023-05-08 DIAGNOSIS — I739 Peripheral vascular disease, unspecified: Secondary | ICD-10-CM | POA: Diagnosis not present

## 2023-05-08 HISTORY — PX: KYPHOPLASTY: SHX5884

## 2023-05-08 LAB — BASIC METABOLIC PANEL
Anion gap: 9 (ref 5–15)
BUN: 14 mg/dL (ref 8–23)
CO2: 23 mmol/L (ref 22–32)
Calcium: 9.5 mg/dL (ref 8.9–10.3)
Chloride: 103 mmol/L (ref 98–111)
Creatinine, Ser: 0.98 mg/dL (ref 0.61–1.24)
GFR, Estimated: >60 mL/min (ref >60)
Glucose, Bld: 99 mg/dL (ref 70–99)
Potassium: 4.1 mmol/L (ref 3.5–5.1)
Sodium: 135 mmol/L (ref 135–145)

## 2023-05-08 LAB — SURGICAL PCR SCREEN
MRSA, PCR: NEGATIVE
Staphylococcus aureus: NEGATIVE

## 2023-05-08 LAB — CBC
HCT: 43.6 % (ref 39.0–52.0)
Hemoglobin: 15.3 g/dL (ref 13.0–17.0)
MCH: 33 pg (ref 26.0–34.0)
MCHC: 35.1 g/dL (ref 30.0–36.0)
MCV: 94.2 fL (ref 80.0–100.0)
Platelets: 179 10*3/uL (ref 150–400)
RBC: 4.63 MIL/uL (ref 4.22–5.81)
RDW: 13.3 % (ref 11.5–15.5)
WBC: 7.8 10*3/uL (ref 4.0–10.5)
nRBC: 0 % (ref 0.0–0.2)

## 2023-05-08 SURGERY — KYPHOPLASTY
Anesthesia: General

## 2023-05-08 MED ORDER — POVIDONE-IODINE 7.5 % EX SOLN
Freq: Once | CUTANEOUS | Status: DC
  Filled 2023-05-08: qty 118

## 2023-05-08 MED ORDER — ACETAMINOPHEN 500 MG PO TABS
1000.0000 mg | ORAL_TABLET | Freq: Once | ORAL | Status: AC
  Administered 2023-05-08: 1000 mg via ORAL
  Filled 2023-05-08: qty 2

## 2023-05-08 MED ORDER — MIDAZOLAM HCL 2 MG/2ML IJ SOLN
INTRAMUSCULAR | Status: AC
  Filled 2023-05-08: qty 2

## 2023-05-08 MED ORDER — METHOCARBAMOL 750 MG PO TABS: 750.0000 mg | ORAL_TABLET | Freq: Four times a day (QID) | ORAL | 0 refills | Status: DC | PRN

## 2023-05-08 MED ORDER — LACTATED RINGERS IV SOLN: INTRAVENOUS | Status: DC

## 2023-05-08 MED ORDER — SUGAMMADEX SODIUM 200 MG/2ML IV SOLN
INTRAVENOUS | Status: DC | PRN
  Administered 2023-05-08: 200 mg via INTRAVENOUS

## 2023-05-08 MED ORDER — IOPAMIDOL (ISOVUE-300) INJECTION 61%
INTRAVENOUS | Status: DC | PRN
  Administered 2023-05-08: 20 mL

## 2023-05-08 MED ORDER — ORAL CARE MOUTH RINSE: 15.0000 mL | Freq: Once | OROMUCOSAL | Status: AC

## 2023-05-08 MED ORDER — BUPIVACAINE-EPINEPHRINE (PF) 0.25% -1:200000 IJ SOLN
INTRAMUSCULAR | Status: DC | PRN
  Administered 2023-05-08: 8 mL via PERINEURAL

## 2023-05-08 MED ORDER — PROPOFOL 10 MG/ML IV BOLUS
INTRAVENOUS | Status: DC | PRN
  Administered 2023-05-08: 150 mg via INTRAVENOUS

## 2023-05-08 MED ORDER — PROPOFOL 10 MG/ML IV BOLUS
INTRAVENOUS | Status: AC
  Filled 2023-05-08: qty 20

## 2023-05-08 MED ORDER — EPHEDRINE SULFATE (PRESSORS) 50 MG/ML IJ SOLN
INTRAMUSCULAR | Status: DC | PRN
  Administered 2023-05-08: 10 mg via INTRAVENOUS

## 2023-05-08 MED ORDER — ROCURONIUM BROMIDE 10 MG/ML (PF) SYRINGE
PREFILLED_SYRINGE | INTRAVENOUS | Status: DC | PRN
  Administered 2023-05-08: 60 mg via INTRAVENOUS

## 2023-05-08 MED ORDER — DEXAMETHASONE SODIUM PHOSPHATE 10 MG/ML IJ SOLN
INTRAMUSCULAR | Status: DC | PRN
  Administered 2023-05-08: 5 mg via INTRAVENOUS

## 2023-05-08 MED ORDER — ONDANSETRON HCL 4 MG/2ML IJ SOLN
INTRAMUSCULAR | Status: AC
  Filled 2023-05-08: qty 2

## 2023-05-08 MED ORDER — HYDROCODONE-ACETAMINOPHEN 5-325 MG PO TABS: 1.0000 | ORAL_TABLET | Freq: Four times a day (QID) | ORAL | 0 refills | Status: DC | PRN

## 2023-05-08 MED ORDER — LIDOCAINE 2% (20 MG/ML) 5 ML SYRINGE
INTRAMUSCULAR | Status: DC | PRN
  Administered 2023-05-08: 60 mg via INTRAVENOUS

## 2023-05-08 MED ORDER — PHENYLEPHRINE 80 MCG/ML (10ML) SYRINGE FOR IV PUSH (FOR BLOOD PRESSURE SUPPORT)
PREFILLED_SYRINGE | INTRAVENOUS | Status: AC
  Filled 2023-05-08: qty 10

## 2023-05-08 MED ORDER — CELECOXIB 200 MG PO CAPS
200.0000 mg | ORAL_CAPSULE | Freq: Once | ORAL | Status: AC
  Administered 2023-05-08: 200 mg via ORAL
  Filled 2023-05-08: qty 1

## 2023-05-08 MED ORDER — FENTANYL CITRATE (PF) 100 MCG/2ML IJ SOLN
25.0000 ug | INTRAMUSCULAR | Status: DC | PRN
  Administered 2023-05-08 (×3): 50 ug via INTRAVENOUS

## 2023-05-08 MED ORDER — CHLORHEXIDINE GLUCONATE 0.12 % MT SOLN: 15.0000 mL | Freq: Once | OROMUCOSAL | Status: AC

## 2023-05-08 MED ORDER — MIDAZOLAM HCL 2 MG/2ML IJ SOLN
INTRAMUSCULAR | Status: DC | PRN
  Administered 2023-05-08 (×2): 1 mg via INTRAVENOUS

## 2023-05-08 MED ORDER — EPHEDRINE 5 MG/ML INJ
INTRAVENOUS | Status: AC
  Filled 2023-05-08: qty 5

## 2023-05-08 MED ORDER — CHLORHEXIDINE GLUCONATE 0.12 % MT SOLN
OROMUCOSAL | Status: AC
  Administered 2023-05-08: 15 mL via OROMUCOSAL
  Filled 2023-05-08: qty 15

## 2023-05-08 MED ORDER — PHENYLEPHRINE 80 MCG/ML (10ML) SYRINGE FOR IV PUSH (FOR BLOOD PRESSURE SUPPORT)
PREFILLED_SYRINGE | INTRAVENOUS | Status: DC | PRN
  Administered 2023-05-08 (×2): 160 ug via INTRAVENOUS

## 2023-05-08 MED ORDER — LIDOCAINE 2% (20 MG/ML) 5 ML SYRINGE
INTRAMUSCULAR | Status: AC
  Filled 2023-05-08: qty 5

## 2023-05-08 MED ORDER — ONDANSETRON HCL 4 MG/2ML IJ SOLN
INTRAMUSCULAR | Status: DC | PRN
  Administered 2023-05-08: 4 mg via INTRAVENOUS

## 2023-05-08 MED ORDER — ROCURONIUM BROMIDE 10 MG/ML (PF) SYRINGE
PREFILLED_SYRINGE | INTRAVENOUS | Status: AC
  Filled 2023-05-08: qty 10

## 2023-05-08 MED ORDER — BUPIVACAINE-EPINEPHRINE (PF) 0.25% -1:200000 IJ SOLN
INTRAMUSCULAR | Status: AC
  Filled 2023-05-08: qty 30

## 2023-05-08 MED ORDER — FENTANYL CITRATE (PF) 100 MCG/2ML IJ SOLN
INTRAMUSCULAR | Status: AC
  Filled 2023-05-08: qty 2

## 2023-05-08 MED ORDER — FENTANYL CITRATE (PF) 250 MCG/5ML IJ SOLN
INTRAMUSCULAR | Status: DC | PRN
  Administered 2023-05-08: 100 ug via INTRAVENOUS

## 2023-05-08 MED ORDER — FENTANYL CITRATE (PF) 250 MCG/5ML IJ SOLN
INTRAMUSCULAR | Status: AC
  Filled 2023-05-08: qty 5

## 2023-05-08 MED ORDER — 0.9 % SODIUM CHLORIDE (POUR BTL) OPTIME
TOPICAL | Status: DC | PRN
  Administered 2023-05-08: 1000 mL

## 2023-05-08 MED ORDER — CEFAZOLIN SODIUM-DEXTROSE 2-4 GM/100ML-% IV SOLN
2.0000 g | INTRAVENOUS | Status: AC
  Administered 2023-05-08: 2 g via INTRAVENOUS

## 2023-05-08 MED ORDER — BACITRACIN 500 UNIT/GM EX OINT
TOPICAL_OINTMENT | CUTANEOUS | Status: DC | PRN
  Administered 2023-05-08: 1 via TOPICAL

## 2023-05-08 MED ORDER — CEFAZOLIN SODIUM-DEXTROSE 2-4 GM/100ML-% IV SOLN
INTRAVENOUS | Status: AC
  Filled 2023-05-08: qty 100

## 2023-05-08 MED ORDER — DEXAMETHASONE SODIUM PHOSPHATE 10 MG/ML IJ SOLN
INTRAMUSCULAR | Status: AC
  Filled 2023-05-08: qty 1

## 2023-05-08 MED ORDER — BACITRACIN ZINC 500 UNIT/GM EX OINT
TOPICAL_OINTMENT | CUTANEOUS | Status: AC
  Filled 2023-05-08: qty 28.35

## 2023-05-08 SURGICAL SUPPLY — 47 items
BAG COUNTER SPONGE SURGICOUNT (BAG) ×2 IMPLANT
BAG SPNG CNTER NS LX DISP (BAG) ×1
BLADE SURG 15 STRL LF DISP TIS (BLADE) ×2 IMPLANT
BLADE SURG 15 STRL SS (BLADE) ×1
CEMENT KYPHON C01A KIT/MIXER (Cement) IMPLANT
COVER MAYO STAND STRL (DRAPES) ×2 IMPLANT
COVER SURGICAL LIGHT HANDLE (MISCELLANEOUS) ×2 IMPLANT
CURETTE EXPRESS SZ2 7MM (INSTRUMENTS) IMPLANT
CURRETTE EXPRESS SZ2 7MM (INSTRUMENTS) ×1
DRAPE C-ARM 42X72 X-RAY (DRAPES) ×2 IMPLANT
DRAPE HALF SHEET 40X57 (DRAPES) IMPLANT
DRAPE INCISE IOBAN 66X45 STRL (DRAPES) ×2 IMPLANT
DRAPE LAPAROTOMY T 102X78X121 (DRAPES) ×2 IMPLANT
DRAPE SURG 17X23 STRL (DRAPES) ×8 IMPLANT
DRAPE WARM FLUID 44X44 (DRAPES) ×2 IMPLANT
DURAPREP 26ML APPLICATOR (WOUND CARE) ×2 IMPLANT
GAUZE 4X4 16PLY ~~LOC~~+RFID DBL (SPONGE) ×2 IMPLANT
GAUZE SPONGE 2X2 8PLY STRL LF (GAUZE/BANDAGES/DRESSINGS) ×2 IMPLANT
GLOVE BIO SURGEON STRL SZ 6.5 (GLOVE) ×2 IMPLANT
GLOVE BIO SURGEON STRL SZ8 (GLOVE) ×2 IMPLANT
GLOVE BIOGEL PI IND STRL 7.0 (GLOVE) ×2 IMPLANT
GLOVE BIOGEL PI IND STRL 8 (GLOVE) ×2 IMPLANT
GLOVE SURG ENC MOIS LTX SZ6.5 (GLOVE) ×2 IMPLANT
GOWN STRL REUS W/ TWL LRG LVL3 (GOWN DISPOSABLE) ×4 IMPLANT
GOWN STRL REUS W/ TWL XL LVL3 (GOWN DISPOSABLE) ×2 IMPLANT
GOWN STRL REUS W/TWL LRG LVL3 (GOWN DISPOSABLE) ×2
GOWN STRL REUS W/TWL XL LVL3 (GOWN DISPOSABLE) ×1
KIT BASIN OR (CUSTOM PROCEDURE TRAY) ×2 IMPLANT
KIT TURNOVER KIT B (KITS) ×2 IMPLANT
NDL 22X1.5 STRL (OR ONLY) (MISCELLANEOUS) IMPLANT
NDL HYPO 25X1 1.5 SAFETY (NEEDLE) ×2 IMPLANT
NDL SPNL 18GX3.5 QUINCKE PK (NEEDLE) ×4 IMPLANT
NEEDLE 22X1.5 STRL (OR ONLY) (MISCELLANEOUS) IMPLANT
NEEDLE HYPO 25X1 1.5 SAFETY (NEEDLE) ×1 IMPLANT
NEEDLE SPNL 18GX3.5 QUINCKE PK (NEEDLE) ×2 IMPLANT
NS IRRIG 1000ML POUR BTL (IV SOLUTION) ×2 IMPLANT
PACK UNIVERSAL I (CUSTOM PROCEDURE TRAY) ×2 IMPLANT
PAD ARMBOARD 7.5X6 YLW CONV (MISCELLANEOUS) ×4 IMPLANT
POSITIONER HEAD PRONE TRACH (MISCELLANEOUS) ×2 IMPLANT
SUT MNCRL AB 4-0 PS2 18 (SUTURE) ×2 IMPLANT
SYR BULB IRRIG 60ML STRL (SYRINGE) ×2 IMPLANT
SYR CONTROL 10ML LL (SYRINGE) ×2 IMPLANT
TAPE CLOTH SURG 4X10 WHT LF (GAUZE/BANDAGES/DRESSINGS) IMPLANT
TOWEL GREEN STERILE (TOWEL DISPOSABLE) ×2 IMPLANT
TOWEL GREEN STERILE FF (TOWEL DISPOSABLE) ×2 IMPLANT
TRAY KYPHOPAK 15/3 ONESTEP 1ST (MISCELLANEOUS) IMPLANT
TRAY KYPHOPAK 20/3 ONESTEP 1ST (MISCELLANEOUS) IMPLANT

## 2023-05-08 NOTE — H&P (Signed)
PREOPERATIVE H&P  Chief Complaint: Back pain  HPI: David King is a 67 y.o. male who presents with ongoing pain in the mid-back  MRI reveals a subacute compression fracture at T7  Patient has failed multiple forms of conservative care and continues to have pain (see office notes for additional details regarding the patient's full course of treatment)  Past Medical History:  Diagnosis Date   Anxiety    new dx   Arthritis    lumbar   COPD (chronic obstructive pulmonary disease) (HCC)    Diverticulosis    Elevated PSA    Headache(784.0)    MIGRAINES   Iritis    CHRONIC IN LEFT EYE - SOME VISIAL IMPAIRMENT IN LEFT EYE   Neuromuscular disorder (HCC)    left leg/foot,pinched siactic nerve   Pain    PAIN LEFT HIP AND DOWN LT LEG WITH NUMBNESS IN LEFT LEG--PT STATES SCIATIC NERVE IMPINGEMENT - PT PLANS BACK IN THE NEAR SURGERY.   Prostate cancer (HCC) 03/05/2013   Adenocarcinoma   Renal cysts, acquired, bilateral 03/19/2013   several simple , CT   Urinary frequency    AND NOCTURIA   Past Surgical History:  Procedure Laterality Date   BACK SURGERY     CARDIAC CATHETERIZATION     CATARACT EXTRACTION W/ INTRAOCULAR LENS IMPLANT Left    EYE SURGERY     RETINAL SURGERY LEFT EYE   HYDROCELE EXCISION Left 03/05/2013   Procedure: HYDROCELECTOMY ADULT;  Surgeon: Antony Haste, MD;  Location: [facility];  Service: Urology;  Laterality: Left;   INCISIONAL HERNIA REPAIR N/A 06/18/2022   Procedure: REPAIR OF INTERNAL HERNIA;  Surgeon: Axel Filler, MD;  Location: [facility] OR;  Service: General;  Laterality: N/A;   INGUINAL HERNIA REPAIR Bilateral AGE 29   INSERTION OF MESH N/A 06/11/2022   Procedure: INSERTION OF MESH;  Surgeon: Axel Filler, MD;  Location: [facility] OR;  Service: General;  Laterality: N/A;   INSERTION OF MESH N/A 06/18/2022   Procedure: INSERTION OF MESH;  Surgeon: Axel Filler, MD;  Location: [facility] OR;  Service: General;  Laterality: N/A;    LAPAROSCOPY N/A 06/18/2022   Procedure: LAPAROSCOPY DIAGNOSTIC;  Surgeon: Axel Filler, MD;  Location: [facility] OR;  Service: General;  Laterality: N/A;   LYMPHADENECTOMY Bilateral 05/06/2013   Procedure: Hart Carwin;  Surgeon: Crecencio Mc, MD;  Location: [facility];  Service: Urology;  Laterality: Bilateral;   PROSTATE BIOPSY N/A 03/05/2013   Procedure: PROSTATE BIOPSY and ultrasound;  Surgeon: Antony Haste, MD;  Location: [facility];  Service: Urology;  Laterality: N/A;   REMOVAL BURSA SAC, LEFT ELBOW  1996   ROBOT ASSISTED LAPAROSCOPIC RADICAL PROSTATECTOMY N/A 05/06/2013   Procedure: ROBOTIC ASSISTED LAPAROSCOPIC RADICAL PROSTATECTOMY LEVEL 3;  Surgeon: Crecencio Mc, MD;  Location: [facility];  Service: Urology;  Laterality: N/A;   TONSILLECTOMY     as achild   XI ROBOTIC ASSISTED VENTRAL HERNIA N/A 06/11/2022   Procedure: ROBOTIC INCISIONAL HERNIA REPAIR WITH MESH;  Surgeon: Axel Filler, MD;  Location: [facility] OR;  Service: General;  Laterality: N/A;   Social History   Socioeconomic History   Marital status: Married    Spouse name: Not on file   Number of children: Not on file   Years of education: Not on file   Highest education level: Not on file  Occupational History   Not on file  Tobacco Use   Smoking status: Former    Packs/day: 2.00  Years: 47.00    Additional pack years: 0.00    Total pack years: 94.00    Types: Cigarettes    Quit date: 12/31/2016    Years since quitting: 6.3   Smokeless tobacco: Never   Tobacco comments:    Patient is currently smoke free since January 2018  Vaping Use   Vaping Use: Never used  Substance and Sexual Activity   Alcohol use: Yes    Alcohol/week: 14.0 standard drinks of alcohol    Types: 14 Shots of liquor per week   Drug use: No   Sexual activity: Never  Other Topics Concern   Not on file  Social History Narrative   Not on file   Social Determinants of Health   Financial Resource Strain: Not on file  Food  Insecurity: Not on file  Transportation Needs: Not on file  Physical Activity: Not on file  Stress: Not on file  Social Connections: Not on file   Family History  Problem Relation Age of Onset   Cancer Father        prostate cancer   Cancer Paternal Grandfather        prostate   No Known Allergies Prior to Admission medications   Medication Sig Start Date End Date Taking? Authorizing Provider  Apoaequorin (PREVAGEN PO) Take 1 capsule by mouth in the morning.   Yes [provider]  [employer] Extract [phone] PSORIASIS MEDICATED EX) Apply 1 Application topically 3 (three) times daily as needed (psoriasis).   Yes [provider]  famotidine (PEPCID) 20 MG tablet Take 20 mg by mouth every other day. At bedtime (alternating with Omeprazole days) 04/14/23  Yes [provider]  Fluticasone-Umeclidin-Vilant (TRELEGY ELLIPTA) 100-62.5-25 MCG/ACT AEPB Inhale 1 puff into the lungs daily. 02/03/23  Yes Mannam, Praveen, MD  gabapentin (NEURONTIN) 300 MG capsule Take 600 mg by mouth 3 (three) times daily.   Yes [provider]  Homeopathic Products (CLEAR TINNITUS) CAPS Take 1 capsule by mouth in the morning. Tinnitus Aid   Yes [provider]  lisinopril (ZESTRIL) 20 MG tablet Take 20 mg by mouth in the morning. 09/29/21  Yes [provider]  omeprazole (PRILOSEC) 20 MG capsule Take 20 mg by mouth every other day. Before breakfast 05/14/18  Yes [provider]  pravastatin (PRAVACHOL) 20 MG tablet Take 20 mg by mouth in the morning. 04/24/22  Yes [provider]  albuterol (VENTOLIN HFA) 108 (90 Base) MCG/ACT inhaler USE 1-2 PUFFS EVERY 6 HOURS AS NEEDED FOR WHEEZING OR SHORTNESS OF BREATH. 01/03/22   Mannam, Praveen, MD  diphenhydramine-acetaminophen (TYLENOL PM EXTRA STRENGTH) 25-500 MG TABS tablet Take 2 tablets by mouth at bedtime.    [provider]     All other systems have been reviewed and were otherwise negative with the  exception of those mentioned in the HPI and as above.  Physical Exam: Vitals:   05/08/23 0915  BP: 137/80  Pulse: 61  Resp: 18  Temp: 98 F (36.7 C)  SpO2: 99%    Body mass index is 30.7 kg/m.  General: Alert, no acute distress Cardiovascular: No pedal edema Respiratory: No cyanosis, no use of accessory musculature Skin: No lesions in the area of chief complaint Neurologic: Sensation intact distally Psychiatric: Patient is competent for consent with normal mood and affect Lymphatic: No axillary or cervical lymphadenopathy   Assessment/Plan: T7 COMPRESSION FRACTURE Plan for Procedure(s): THORACIC 7 KYPHOPLASTY   Jackelyn Hoehn, MD 05/08/2023 10:47 AM

## 2023-05-08 NOTE — Transfer of Care (Signed)
Immediate Anesthesia Transfer of Care Note  Patient: David King  Procedure(s) Performed: THORACIC SEVEN KYPHOPLASTY  Patient Location: PACU  Anesthesia Type:General  Level of Consciousness: awake, alert , oriented, and patient cooperative  Airway & Oxygen Therapy: Patient Spontanous Breathing  Post-op Assessment: Report given to RN and Post -op Vital signs reviewed and stable  Post vital signs: Reviewed and stable  Last Vitals:  Vitals Value Taken Time  BP 107/82 05/08/23 1521  Temp    Pulse 90 05/08/23 1525  Resp 13 05/08/23 1525  SpO2 95 % 05/08/23 1525  Vitals shown include unvalidated device data.  Last Pain:  Vitals:   05/08/23 0927  TempSrc:   PainSc: 0-No pain         Complications: No notable events documented.

## 2023-05-08 NOTE — Anesthesia Procedure Notes (Signed)
Procedure Name: Intubation Date/Time: 05/08/2023 2:21 PM  Performed by: Alwyn Ren, CRNAPre-anesthesia Checklist: Patient identified, Emergency Drugs available, Suction available and Patient being monitored Patient Re-evaluated:Patient Re-evaluated prior to induction Oxygen Delivery Method: Circle system utilized Preoxygenation: Pre-oxygenation with 100% oxygen Induction Type: IV induction Ventilation: Mask ventilation without difficulty Laryngoscope Size: Miller and 2 Grade View: Grade I Tube type: Oral Tube size: 7.0 mm Number of attempts: 1 Airway Equipment and Method: Stylet and Oral airway Placement Confirmation: ETT inserted through vocal cords under direct vision, positive ETCO2 and breath sounds checked- equal and bilateral Secured at: 22 cm Tube secured with: Tape Dental Injury: Teeth and Oropharynx as per pre-operative assessment

## 2023-05-08 NOTE — Anesthesia Postprocedure Evaluation (Signed)
Anesthesia Post Note  Patient: David King  Procedure(s) Performed: THORACIC SEVEN KYPHOPLASTY     Patient location during evaluation: PACU Anesthesia Type: General Level of consciousness: awake and alert Pain management: pain level controlled Vital Signs Assessment: post-procedure vital signs reviewed and stable Respiratory status: spontaneous breathing, nonlabored ventilation, respiratory function stable and patient connected to nasal cannula oxygen Cardiovascular status: blood pressure returned to baseline and stable Postop Assessment: no apparent nausea or vomiting Anesthetic complications: no   No notable events documented.  Last Vitals:  Vitals:   05/08/23 1600 05/08/23 1615  BP: 116/75 125/77  Pulse: 67 (!) 58  Resp: 19 15  Temp:  36.6 C  SpO2: 94% 100%    Last Pain:  Vitals:   05/08/23 1600  TempSrc:   PainSc: 3                  Anahi Belmar P Sonny Anthe[employer]

## 2023-05-08 NOTE — Op Note (Signed)
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  SURGEON:  Estill Bamberg, MD.  ASSISTANTJason Coop, PA-C  ANESTHESIA:  General endotracheal anesthesia.  COMPLICATIONS:  None.  DISPOSITION:  Stable.  ESTIMATED BLOOD LOSS:  Minimal.  INDICATIONS FOR SURGERY:  Briefly, Mr. Ehlke is a very pleasant 67 y.o.- year-old male, who did have an acute onset of pain in his back.   His pain was rather severe.  The patient's imaging studies did clearly reveal a T7 compression fracture, which I did feel was related to osteoporosis.  Given his ongoing pain and dysfunction, we did discuss proceeding with the procedure noted above.  I did fully discuss the procedure with the patient, and she did wish to proceed.  OPERATIVE DETAILS:  On 05/08/2023, the patient was brought to surgery and general endotracheal anesthesia was administered.  The patient was placed prone on a well-padded flat Jackson bed with gel rolls placed under the patient's chest and hips.  Antibiotics were given.  AP and lateral fluoroscopy was brought into the field.  The T7 pedicles were marked out in the usual fashion.  After a time-out procedure was performed, I did advance Jamshidis across the T7 pedicles on the right and left sides.  I then drilled through the Jamshidis.  I then inserted kyphoplasty balloons and I was able to inflate the balloons with a total of approximately 4cc of contrast. The balloons were deflated then removed.  At this point, after the cement was mixed, a total of approximately 4cc of cement was injected through the right and left cannulas, half on the right, and half on the left.  Excellent  interdigitation of cement was identified.  No abnormal extravasation was noted.  The cement was then allowed to harden, after which point the Jamshidis were removed.  The wound  was then irrigated and closed using 4-0 Monocryl.  Bacitracin and a sterile dressing were applied.  The patient was then awoken from general endotracheal anesthesia and transferred to recovery in stable condition.  Estill Bamberg, MD

## 2023-05-09 ENCOUNTER — Encounter (HOSPITAL_COMMUNITY): Payer: Self-pay | Admitting: Orthopedic Surgery

## 2023-06-07 ENCOUNTER — Other Ambulatory Visit: Payer: Self-pay | Admitting: Pulmonary Disease

## 2023-06-11 DIAGNOSIS — M25559 Pain in unspecified hip: Secondary | ICD-10-CM | POA: Diagnosis not present

## 2023-06-12 DIAGNOSIS — R0782 Intercostal pain: Secondary | ICD-10-CM | POA: Diagnosis not present

## 2023-06-13 DIAGNOSIS — M545 Low back pain, unspecified: Secondary | ICD-10-CM | POA: Diagnosis not present

## 2023-06-16 DIAGNOSIS — M545 Low back pain, unspecified: Secondary | ICD-10-CM | POA: Diagnosis not present

## 2023-06-23 ENCOUNTER — Encounter: Payer: Self-pay | Admitting: Gastroenterology

## 2023-07-02 DIAGNOSIS — M545 Low back pain, unspecified: Secondary | ICD-10-CM | POA: Diagnosis not present

## 2023-07-15 ENCOUNTER — Ambulatory Visit: Payer: Medicare Other

## 2023-07-15 ENCOUNTER — Encounter: Payer: Self-pay | Admitting: Gastroenterology

## 2023-07-15 VITALS — Ht 69.0 in | Wt 200.0 lb

## 2023-07-15 DIAGNOSIS — Z1211 Encounter for screening for malignant neoplasm of colon: Secondary | ICD-10-CM

## 2023-07-15 MED ORDER — NA SULFATE-K SULFATE-MG SULF 17.5-3.13-1.6 GM/177ML PO SOLN: 1.0000 | Freq: Once | ORAL | 0 refills | Status: AC

## 2023-07-15 NOTE — Progress Notes (Signed)
No egg or soy allergy known to patient  No issues known to pt with past sedation with any surgeries or procedures Patient denies ever being told they had issues or difficulty with intubation  No FH of Malignant Hyperthermia Pt is not on diet pills Pt is not on  home 02  Pt is not on blood thinners  Pt denies issues with constipation  No A fib or A flutter Have any cardiac testing pending--no Pt instructed to use Singlecare.com or GoodRx for a price reduction on prep  Can ambulate independently  

## 2023-07-17 DIAGNOSIS — M545 Low back pain, unspecified: Secondary | ICD-10-CM | POA: Diagnosis not present

## 2023-07-22 ENCOUNTER — Ambulatory Visit: Payer: Medicare Other | Admitting: Podiatry

## 2023-07-22 DIAGNOSIS — M25559 Pain in unspecified hip: Secondary | ICD-10-CM | POA: Diagnosis not present

## 2023-07-29 ENCOUNTER — Ambulatory Visit (AMBULATORY_SURGERY_CENTER): Payer: Medicare Other | Admitting: Gastroenterology

## 2023-07-29 ENCOUNTER — Encounter: Payer: Self-pay | Admitting: Gastroenterology

## 2023-07-29 VITALS — BP 125/77 | HR 73 | Temp 97.6°F | Resp 10 | Ht 69.0 in | Wt 200.0 lb

## 2023-07-29 DIAGNOSIS — D125 Benign neoplasm of sigmoid colon: Secondary | ICD-10-CM

## 2023-07-29 DIAGNOSIS — D123 Benign neoplasm of transverse colon: Secondary | ICD-10-CM | POA: Diagnosis not present

## 2023-07-29 DIAGNOSIS — Z1211 Encounter for screening for malignant neoplasm of colon: Secondary | ICD-10-CM | POA: Diagnosis not present

## 2023-07-29 DIAGNOSIS — J449 Chronic obstructive pulmonary disease, unspecified: Secondary | ICD-10-CM | POA: Diagnosis not present

## 2023-07-29 DIAGNOSIS — D128 Benign neoplasm of rectum: Secondary | ICD-10-CM

## 2023-07-29 DIAGNOSIS — D122 Benign neoplasm of ascending colon: Secondary | ICD-10-CM | POA: Diagnosis not present

## 2023-07-29 DIAGNOSIS — D120 Benign neoplasm of cecum: Secondary | ICD-10-CM | POA: Diagnosis not present

## 2023-07-29 DIAGNOSIS — K635 Polyp of colon: Secondary | ICD-10-CM | POA: Diagnosis not present

## 2023-07-29 MED ORDER — SODIUM CHLORIDE 0.9 % IV SOLN
500.0000 mL | INTRAVENOUS | Status: DC
Start: 2023-07-29 — End: 2023-07-29

## 2023-07-29 NOTE — Progress Notes (Signed)
Patient states there have been no changes to medical or surgical history since time of pre-visit. 

## 2023-07-29 NOTE — Progress Notes (Signed)
Vss nad trans to pacu 

## 2023-07-29 NOTE — Progress Notes (Signed)
[facility] History and Physical   Primary Care Physician:  Daisy Floro, MD   Reason for Procedure:   Colon cancer screening  Plan:    colonoscopy     HPI: David King is a 67 y.o. male  here for colonoscopy screening - first time exam.  . Patient denies any bowel symptoms at this time. No family history of colon cancer known. Otherwise feels well without any cardiopulmonary symptoms.   I have discussed risks / benefits of anesthesia and endoscopic procedure with David King and they wish to proceed with the exams as outlined today.    Past Medical History:  Diagnosis Date   Anxiety    new dx   Arthritis    lumbar   Cataract    COPD (chronic obstructive pulmonary disease) (HCC)    Diverticulosis    Elevated PSA    Headache(784.0)    MIGRAINES   Iritis    CHRONIC IN LEFT EYE - SOME VISIAL IMPAIRMENT IN LEFT EYE   Neuromuscular disorder (HCC)    left leg/foot,pinched siactic nerve   Pain    PAIN LEFT HIP AND DOWN LT LEG WITH NUMBNESS IN LEFT LEG--PT STATES SCIATIC NERVE IMPINGEMENT - PT PLANS BACK IN THE NEAR SURGERY.   Prostate cancer (HCC) 03/05/2013   Adenocarcinoma   Renal cysts, acquired, bilateral 03/19/2013   several simple , CT   Urinary frequency    AND NOCTURIA    Past Surgical History:  Procedure Laterality Date   BACK SURGERY     CARDIAC CATHETERIZATION     CATARACT EXTRACTION W/ INTRAOCULAR LENS IMPLANT Left    EYE SURGERY     RETINAL SURGERY LEFT EYE   HYDROCELE EXCISION Left 03/05/2013   Procedure: HYDROCELECTOMY ADULT;  Surgeon: Antony Haste, MD;  Location[facility];  Service: Urology;  Laterality: Left;   INCISIONAL HERNIA REPAIR N/A 06/18/2022   Procedure: REPAIR OF INTERNAL HERNIA;  Surgeon: Axel Filler, MD;  Location: [facility] OR;  Service: General;  Laterality: N/A;   INGUINAL HERNIA REPAIR Bilateral AGE 65   INSERTION OF MESH N/A 06/11/2022   Procedure: INSERTION OF MESH;  Surgeon: Axel Filler, MD;  Location: [facility] OR;  Service: General;  Laterality: N/A;   INSERTION OF MESH N/A 06/18/2022   Procedure: INSERTION OF MESH;  Surgeon: Axel Filler, MD;  Location: [facility] OR;  Service: General;  Laterality: N/A;   KYPHOPLASTY N/A 05/08/2023   Procedure: THORACIC SEVEN KYPHOPLASTY;  Surgeon: Estill Bamberg, MD;  Location: [facility] OR;  Service: Orthopedics;  Laterality: N/A;   LAPAROSCOPY N/A 06/18/2022   Procedure: LAPAROSCOPY DIAGNOSTIC;  Surgeon: Axel Filler, MD;  Location: [facility] OR;  Service: General;  Laterality: N/A;   LYMPHADENECTOMY Bilateral 05/06/2013   Procedure: Hart Carwin;  Surgeon: Crecencio Mc, MD;  Location: [facility];  Service: Urology;  Laterality: Bilateral;   PROSTATE BIOPSY N/A 03/05/2013   Procedure: PROSTATE BIOPSY and ultrasound;  Surgeon: Antony Haste, MD;  Location: [facility];  Service: Urology;  Laterality: N/A;   REMOVAL BURSA SAC, LEFT ELBOW  1996   ROBOT ASSISTED LAPAROSCOPIC RADICAL PROSTATECTOMY N/A 05/06/2013   Procedure: ROBOTIC ASSISTED LAPAROSCOPIC RADICAL PROSTATECTOMY LEVEL 3;  Surgeon: Crecencio Mc, MD;  Location: [facility];  Service: Urology;  Laterality: N/A;   TONSILLECTOMY     as achild   XI ROBOTIC ASSISTED VENTRAL HERNIA N/A 06/11/2022   Procedure: ROBOTIC INCISIONAL HERNIA REPAIR WITH MESH;  Surgeon: Axel Filler, MD;  Location: [facility] OR;  Service: General;  Laterality: N/A;    Prior to Admission medications   Medication Sig Start Date End Date Taking? Authorizing Provider  aspirin EC 81 MG tablet Take by mouth. 03/06/18 05/06/24 Yes [provider]  [employer] Extract [phone] PSORIASIS MEDICATED EX) Apply 1 Application topically 3 (three) times daily as needed (psoriasis).   Yes [provider]  diphenhydramine-acetaminophen (TYLENOL PM EXTRA STRENGTH) 25-500 MG TABS tablet Take 2 tablets by mouth at bedtime.   Yes [provider]  gabapentin (NEURONTIN) 300 MG capsule Take 600 mg by mouth 3 (three)  times daily.   Yes [provider]  lisinopril (ZESTRIL) 20 MG tablet Take 20 mg by mouth in the morning. 09/29/21  Yes [provider]  pravastatin (PRAVACHOL) 20 MG tablet Take 20 mg by mouth in the morning. 04/24/22  Yes [provider]  TRELEGY ELLIPTA 100-62.5-25 MCG/ACT AEPB USE 1 INHALATION BY MOUTH INTO  THE LUNGS ONCE DAILY AT THE SAME TIME EACH DAY 06/09/23  Yes Mannam, Praveen, MD  albuterol (VENTOLIN HFA) 108 (90 Base) MCG/ACT inhaler USE 1-2 PUFFS EVERY 6 HOURS AS NEEDED FOR WHEEZING OR SHORTNESS OF BREATH. Patient not taking: Reported on 07/15/2023 01/03/22   [name], MD    Current Outpatient Medications  Medication Sig Dispense Refill   aspirin EC 81 MG tablet Take by mouth.     [employer] Extract [phone] PSORIASIS MEDICATED EX) Apply 1 Application topically 3 (three) times daily as needed (psoriasis).     diphenhydramine-acetaminophen (TYLENOL PM EXTRA STRENGTH) 25-500 MG TABS tablet Take 2 tablets by mouth at bedtime.     gabapentin (NEURONTIN) 300 MG capsule Take 600 mg by mouth 3 (three) times daily.     lisinopril (ZESTRIL) 20 MG tablet Take 20 mg by mouth in the morning.     pravastatin (PRAVACHOL) 20 MG tablet Take 20 mg by mouth in the morning.     TRELEGY ELLIPTA 100-62.5-25 MCG/ACT AEPB USE 1 INHALATION BY MOUTH INTO  THE LUNGS ONCE DAILY AT THE SAME TIME EACH DAY 180 each 3   albuterol (VENTOLIN HFA) 108 (90 Base) MCG/ACT inhaler USE 1-2 PUFFS EVERY 6 HOURS AS NEEDED FOR WHEEZING OR SHORTNESS OF BREATH. (Patient not taking: Reported on 07/15/2023) 8.5 g 2   Current Facility-Administered Medications  Medication Dose Route Frequency Provider Last Rate Last Admin   0.9 %  sodium chloride infusion  500 mL Intravenous Continuous Eliane Hammersmith, Willaim Rayas, MD        Allergies as of 07/29/2023   (No Known Allergies)    Family History  Problem Relation Age of Onset   Cancer Father        prostate cancer   Cancer Paternal Grandfather         prostate   Colon cancer Neg Hx    Colon polyps Neg Hx    Esophageal cancer Neg Hx    Rectal cancer Neg Hx    Stomach cancer Neg Hx     Social History   Socioeconomic History   Marital status: Married    Spouse name: Not on file   Number of children: Not on file   Years of education: Not on file   Highest education level: Not on file  Occupational History   Not on file  Tobacco Use   Smoking status: Former    Current packs/day: 0.00    Average packs/day: 2.0 packs/day for 47.0 years (94.0 ttl pk-yrs)    Types: Cigarettes    Start  date: 12/31/1969    Quit date: 12/31/2016    Years since quitting: 6.5   Smokeless tobacco: Never   Tobacco comments:    Patient is currently smoke free since January 2018  Vaping Use   Vaping status: Never Used  Substance and Sexual Activity   Alcohol use: Yes    Alcohol/week: 14.0 standard drinks of alcohol    Types: 14 Shots of liquor per week   Drug use: No   Sexual activity: Never  Other Topics Concern   Not on file  Social History Narrative   Not on file   Social Determinants of Health   Financial Resource Strain: Low Risk  (05/05/2023)   Received from [facility]   Overall Financial Resource Strain (CARDIA)    Difficulty of Paying Living Expenses: Not hard at all  Food Insecurity: No Food Insecurity (05/05/2023)   Received from [facility]   Hunger Vital Sign    Worried About Running Out of Food in the Last Year: Never true    Ran Out of Food in the Last Year: Never true  Transportation Needs: No Transportation Needs (05/05/2023)   Received from [facility] - Transportation    Lack of Transportation (Medical): No    Lack of Transportation (Non-Medical): No  Physical Activity: Unknown (05/05/2023)   Received from [facility]   Exercise Vital Sign    Days of Exercise per Week: 0 days    Minutes of Exercise per Session: Not on file  Stress: No Stress Concern Present (05/05/2023)   Received from [facility] - Occupational Stress Questionnaire    Feeling of Stress : Only a little  Social Connections: Socially Integrated (05/05/2023)   Received from [facility]   Social Network    How would you rate your social network (family, work, friends)?: Good participation with social networks  Intimate Partner Violence: Not At Risk (05/05/2023)   Received from [facility]   HITS    Over the last 12 months how often did your partner physically hurt you?: 1    Over the last 12 months how often did your partner insult you or talk down to you?: 2    Over the last 12 months how often did your partner threaten you with physical harm?: 1    Over the last 12 months how often did your partner scream or curse at you?: 3    Review of Systems: All other review of systems negative except as mentioned in the HPI.  Physical Exam: Vital signs BP 132/76   Pulse 82   Temp 97.6 F (36.4 C) (Skin)   Ht 5\' 9"  (1.753 m)   Wt 200 lb (90.7 kg)   SpO2 98%   BMI 29.53 kg/m   General:   Alert,  Well-developed, pleasant and cooperative in NAD Lungs:  Clear throughout to auscultation.   Heart:  Regular rate and rhythm Abdomen:  Soft, nontender and nondistended.   Neuro/Psych:  Alert and cooperative. Normal mood and affect. A and O x 3  Harlin Rain, MD [facility]

## 2023-07-29 NOTE — Patient Instructions (Signed)
Please read handouts provided. Continue present medications. Await pathology results.   YOU HAD AN ENDOSCOPIC PROCEDURE TODAY AT THE [facility]:   Refer to the procedure report that was given to you for any specific questions about what was found during the examination.  If the procedure report does not answer your questions, please call your gastroenterologist to clarify.  If you requested that your care partner not be given the details of your procedure findings, then the procedure report has been included in a sealed envelope for you to review at your convenience later.  YOU SHOULD EXPECT: Some feelings of bloating in the abdomen. Passage of more gas than usual.  Walking can help get rid of the air that was put into your GI tract during the procedure and reduce the bloating. If you had a lower endoscopy (such as a colonoscopy or flexible sigmoidoscopy) you may notice spotting of blood in your stool or on the toilet paper. If you underwent a bowel prep for your procedure, you may not have a normal bowel movement for a few days.  Please Note:  You might notice some irritation and congestion in your nose or some drainage.  This is from the oxygen used during your procedure.  There is no need for concern and it should clear up in a day or so.  SYMPTOMS TO REPORT IMMEDIATELY:  Following lower endoscopy (colonoscopy or flexible sigmoidoscopy):  Excessive amounts of blood in the stool  Significant tenderness or worsening of abdominal pains  Swelling of the abdomen that is new, acute  Fever of 100F or higher  For urgent or emergent issues, a gastroenterologist can be reached at any hour by calling [phone]. Do not use MyChart messaging for urgent concerns.    DIET:  We do recommend a small meal at first, but then you may proceed to your regular diet.  Drink plenty of fluids but you should avoid alcoholic beverages for 24 hours.  ACTIVITY:  You should plan to take it easy for  the rest of today and you should NOT DRIVE or use heavy machinery until tomorrow (because of the sedation medicines used during the test).    FOLLOW UP: Our staff will call the number listed on your records the next business day following your procedure.  We will call around 7:15- 8:00 am to check on you and address any questions or concerns that you may have regarding the information given to you following your procedure. If we do not reach you, we will leave a message.     If any biopsies were taken you will be contacted by phone or by letter within the next 1-3 weeks.  Please call us at [phone] if you have not heard about the biopsies in 3 weeks.    SIGNATURES/CONFIDENTIALITY: You and/or your care partner have signed paperwork which will be entered into your electronic medical record.  These signatures attest to the fact that that the information above on your After Visit Summary has been reviewed and is understood.  Full responsibility of the confidentiality of this discharge information lies with you and/or your care-partner. 

## 2023-07-29 NOTE — Op Note (Signed)
[facility] Patient Name: David King Procedure Date: 07/29/2023 2:20 PM MRN: [id number] Endoscopist: Viviann Spare P. Adela Lank , MD, [id number] Age: 67 Referring MD:  Date of Birth: [birth year] Gender: Male Account #: [id number] Procedure:                Colonoscopy Indications:              Screening for colorectal malignant neoplasm, This                            is the patient's first colonoscopy Medicines:                Monitored Anesthesia Care Procedure:                Pre-Anesthesia Assessment:                           - Prior to the procedure, a History and Physical                            was performed, and patient medications and                            allergies were reviewed. The patient's tolerance of                            previous anesthesia was also reviewed. The risks                            and benefits of the procedure and the sedation                            options and risks were discussed with the patient.                            All questions were answered, and informed consent                            was obtained. Prior Anticoagulants: The patient has                            taken no anticoagulant or antiplatelet agents. ASA                            Grade Assessment: III - A patient with severe                            systemic disease. After reviewing the risks and                            benefits, the patient was deemed in satisfactory                            condition to undergo the procedure.  After obtaining informed consent, the colonoscope                            was passed under direct vision. Throughout the                            procedure, the patient's blood pressure, pulse, and                            oxygen saturations were monitored continuously. The                            CF [id number] #[id number] was introduced through the anus                            and advanced to  the the cecum, identified by                            appendiceal orifice and ileocecal valve. The                            colonoscopy was performed without difficulty. The                            patient tolerated the procedure well. The quality                            of the bowel preparation was good. The ileocecal                            valve, appendiceal orifice, and rectum were                            photographed. Scope In: 2:30:43 PM Scope Out: 3:25:38 PM Scope Withdrawal Time: 0 hours 46 minutes 31 seconds  Total Procedure Duration: 0 hours 54 minutes 55 seconds  Findings:                 The perianal and digital rectal examinations were                            normal.                           A 7 mm polyp was found in the cecum. The polyp was                            sessile. The polyp was removed with a cold snare.                            Resection and retrieval were complete.                           A 15 mm polyp was found in the ascending colon. The  polyp was sessile. The polyp was removed with a                            cold snare. Resection and retrieval were complete.                           Eight sessile polyps were found in the ascending                            colon. The polyps were 3 to 8 mm in size. These                            polyps were removed with a cold snare. Resection                            and retrieval were complete.                           A 4 mm polyp was found in the hepatic flexure. The                            polyp was sessile. The polyp was removed with a                            cold snare. Resection and retrieval were complete.                           Five sessile polyps were found in the transverse                            colon. The polyps were 3 to 5 mm in size. These                            polyps were removed with a cold snare. Resection                             and retrieval were complete.                           Four sessile polyps were found in the sigmoid                            colon. The polyps were 3 to 6 mm in size. These                            polyps were removed with a cold snare. Resection                            and retrieval were complete.                           Four sessile polyps were found in the rectum. The  polyps were 3 to 5 mm in size. These polyps were                            removed with a cold snare. Resection and retrieval                            were complete.                           Multiple small-mouthed diverticula were found in                            the transverse colon and left colon.                           Internal hemorrhoids were found during retroflexion.                           The exam was otherwise without abnormality. Of                            note, significant colonic spasm and poor air                            retention prolonged this exam. Retroflexed views of                            the rectum not obtained due to poor air retention. Complications:            No immediate complications. Estimated blood loss:                            Minimal. Estimated Blood Loss:     Estimated blood loss was minimal. Impression:               - One 7 mm polyp in the cecum, removed with a cold                            snare. Resected and retrieved.                           - One 15 mm polyp in the ascending colon, removed                            with a cold snare. Resected and retrieved.                           - Eight 3 to 8 mm polyps in the ascending colon,                            removed with a cold snare. Resected and retrieved.                           - One 4 mm polyp at the  hepatic flexure, removed                            with a cold snare. Resected and retrieved.                           - Five 3 to 5 mm polyps in the transverse  colon,                            removed with a cold snare. Resected and retrieved.                           - Four 3 to 6 mm polyps in the sigmoid colon,                            removed with a cold snare. Resected and retrieved.                           - Four 3 to 5 mm polyps in the rectum, removed with                            a cold snare. Resected and retrieved.                           - Diverticulosis in the transverse colon and in the                            left colon.                           - Internal hemorrhoids.                           - The examination was otherwise normal. Recommendation:           - Patient has a contact number available for                            emergencies. The signs and symptoms of potential                            delayed complications were discussed with the                            patient. Return to normal activities tomorrow.                            Written discharge instructions were provided to the                            patient.                           - Resume previous diet.                           -  Continue present medications.                           - Await pathology results. Anticipate repeat                            colonoscopy in one year given burden of polyps                            removed on this exam Felicia Bloomquist P. Adela Lank, MD 07/29/2023 3:37:40 PM This report has been signed electronically.

## 2023-07-29 NOTE — Progress Notes (Signed)
Called to room to assist during endoscopic procedure.  Patient ID and intended procedure confirmed with present staff. Received instructions for my participation in the procedure from the performing physician.  

## 2023-07-30 ENCOUNTER — Telehealth: Payer: Self-pay | Admitting: *Deleted

## 2023-07-30 NOTE — Telephone Encounter (Signed)
  Follow up Call-     07/29/2023    1:40 PM  Call back number  Post procedure Call Back phone  # [phone]  Permission to leave phone message No     Patient questions:  Do you have a fever, pain , or abdominal swelling? No. Pain Score  0 *  Have you tolerated food without any problems? Yes.    Have you been able to return to your normal activities? Yes.    Do you have any questions about your discharge instructions: Diet   No. Medications  No. Follow up visit  No.  Do you have questions or concerns about your Care? No.  Actions: * If pain score is 4 or above: No action needed, pain <4.

## 2023-08-04 DIAGNOSIS — M25559 Pain in unspecified hip: Secondary | ICD-10-CM | POA: Diagnosis not present

## 2023-08-05 DIAGNOSIS — T8529XA Other mechanical complication of intraocular lens, initial encounter: Secondary | ICD-10-CM | POA: Diagnosis not present

## 2023-08-05 DIAGNOSIS — H2702 Aphakia, left eye: Secondary | ICD-10-CM | POA: Diagnosis not present

## 2023-08-14 DIAGNOSIS — M25559 Pain in unspecified hip: Secondary | ICD-10-CM | POA: Diagnosis not present

## 2023-08-27 ENCOUNTER — Encounter: Payer: Self-pay | Admitting: Podiatry

## 2023-08-27 ENCOUNTER — Ambulatory Visit: Payer: Medicare Other | Admitting: Podiatry

## 2023-08-27 DIAGNOSIS — M79675 Pain in left toe(s): Secondary | ICD-10-CM | POA: Diagnosis not present

## 2023-08-27 DIAGNOSIS — I739 Peripheral vascular disease, unspecified: Secondary | ICD-10-CM

## 2023-08-27 DIAGNOSIS — M79674 Pain in right toe(s): Secondary | ICD-10-CM | POA: Diagnosis not present

## 2023-08-27 DIAGNOSIS — B351 Tinea unguium: Secondary | ICD-10-CM | POA: Diagnosis not present

## 2023-08-27 NOTE — Progress Notes (Signed)
This patient returns to my office for at risk foot care.  This patient requires this care by a professional since this patient will be at risk due to having claudication.  This patient is unable to cut nails himself since the patient cannot reach his nails.These nails are painful walking and wearing shoes.  This patient presents for at risk foot care today.  General Appearance  Alert, conversant and in no acute stress.  Vascular  Dorsalis pedis and posterior tibial  pulses are palpable  bilaterally.  Capillary return is within normal limits  bilaterally. Temperature is within normal limits  bilaterally.  Neurologic  Senn-Weinstein monofilament wire test within normal limits  bilaterally. Muscle power within normal limits bilaterally.  Nails Thick disfigured discolored nails with subungual debris  from hallux to fifth toes bilaterally. No evidence of bacterial infection or drainage bilaterally.  Orthopedic  No limitations of motion  feet .  No crepitus or effusions noted.  No bony pathology or digital deformities noted.  Skin  normotropic skin with no porokeratosis noted bilaterally.  No signs of infections or ulcers noted.     Onychomycosis  Pain in right toes  Pain in left toes  Consent was obtained for treatment procedures.   Mechanical debridement of nails 1-5  bilaterally performed with a nail nipper.  Filed with dremel without incident.    Return office visit     3 months                 Told patient to return for periodic foot care and evaluation due to potential at risk complications.   Helane Gunther DPM

## 2023-09-09 IMAGING — DX DG ABD PORTABLE 1V
1 series · 1 of 1 positions shown · non-contrast
Comparison: 06/14/2022

CLINICAL DATA: NG tube placement

EXAM:
PORTABLE ABDOMEN - 1 VIEW

[abdomen supine]
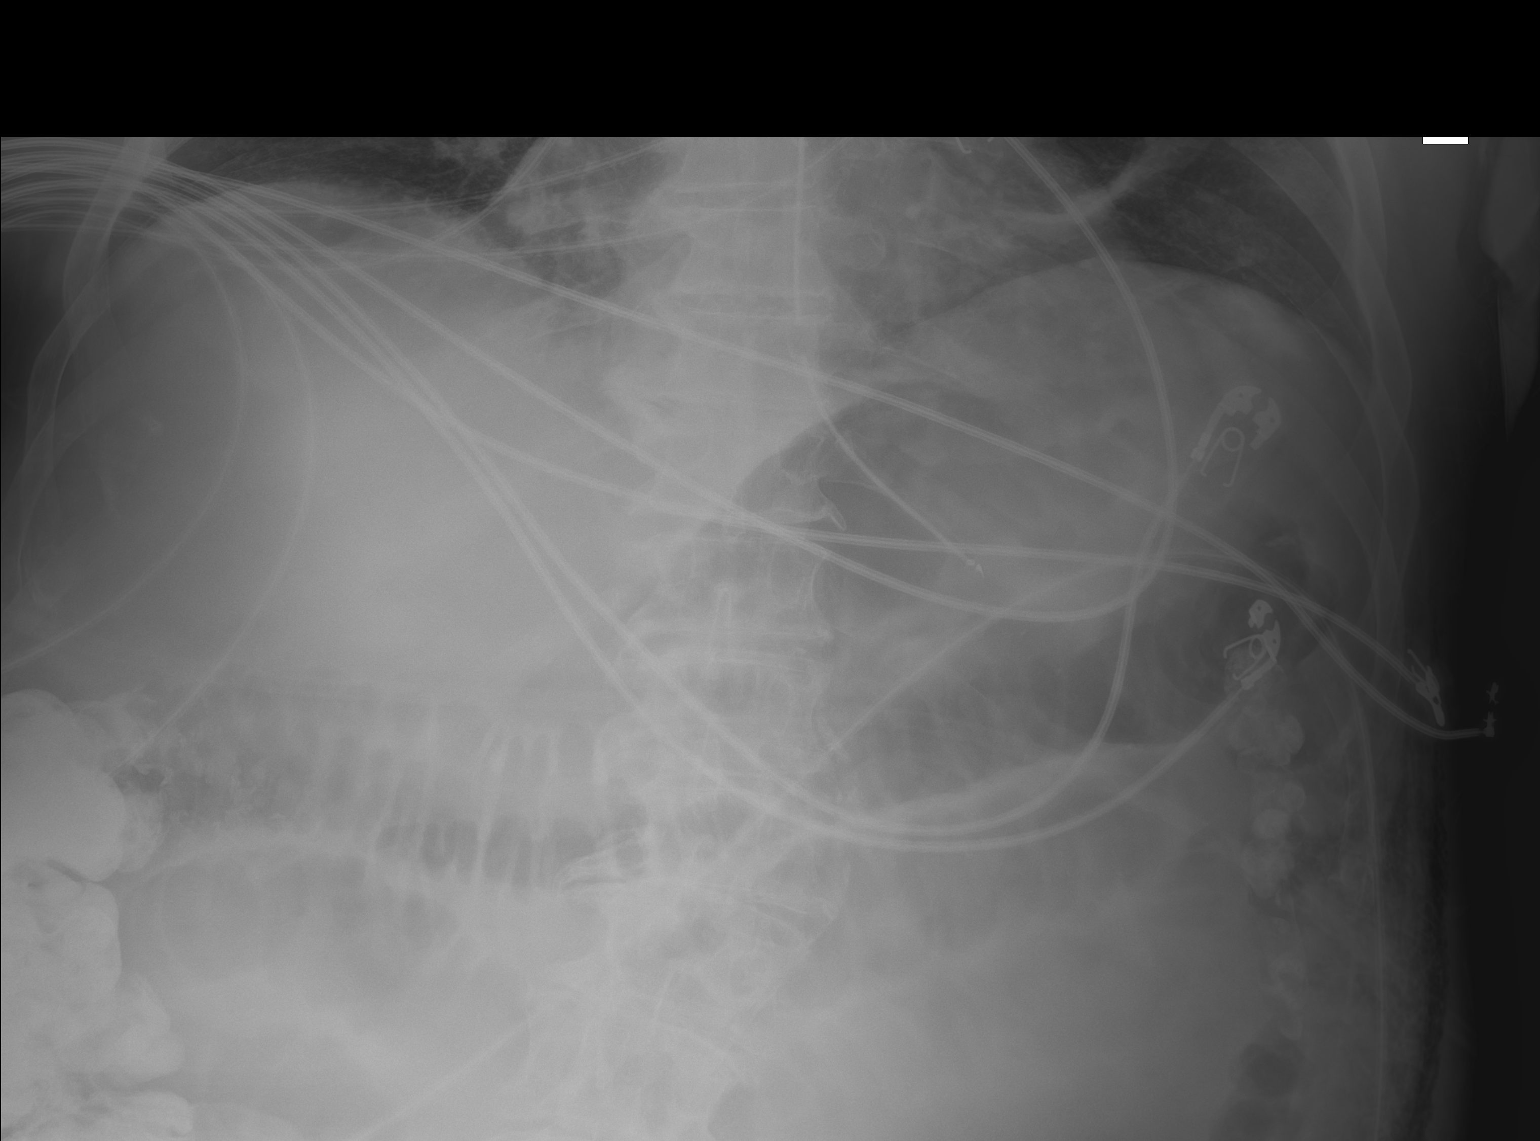

[1 of 1 positions shown; findings below may reference images not displayed]

FINDINGS: Limited radiograph of the lower chest and upper abdomen was obtained
for the purposes of enteric tube localization. Enteric tube is seen
coursing below the diaphragm with distal tip terminating within the
proximal gastric body and side port in the distal esophagus. Dilated
small bowel loops within the included upper abdomen, as seen on
recent prior study.
IMPRESSION: 1. Enteric tube with distal tip terminating within the proximal
gastric body and side port in the distal esophagus. Recommend
advancement of at least 5 cm.
2. Dilated small bowel loops within the included upper abdomen.

## 2023-09-10 DIAGNOSIS — H26412 Soemmering's ring, left eye: Secondary | ICD-10-CM | POA: Diagnosis not present

## 2023-09-10 DIAGNOSIS — H2702 Aphakia, left eye: Secondary | ICD-10-CM | POA: Diagnosis not present

## 2023-09-10 DIAGNOSIS — H2181 Floppy iris syndrome: Secondary | ICD-10-CM | POA: Diagnosis not present

## 2023-09-10 DIAGNOSIS — T8522XA Displacement of intraocular lens, initial encounter: Secondary | ICD-10-CM | POA: Diagnosis not present

## 2023-09-10 DIAGNOSIS — H5988 Other intraoperative complications of eye and adnexa, not elsewhere classified: Secondary | ICD-10-CM | POA: Diagnosis not present

## 2023-09-10 DIAGNOSIS — T8529XA Other mechanical complication of intraocular lens, initial encounter: Secondary | ICD-10-CM | POA: Diagnosis not present

## 2023-09-11 IMAGING — DX DG ABD PORTABLE 1V
1 series · 2 of 2 positions shown · non-contrast
Comparison: 06/14/2022

CLINICAL DATA: Ileus.  NG tube placement.

EXAM:
PORTABLE ABDOMEN - 1 VIEW

[Series 1: abdomen · 0.14mm/px · 2 of 2 slices shown]
[im 1/2]
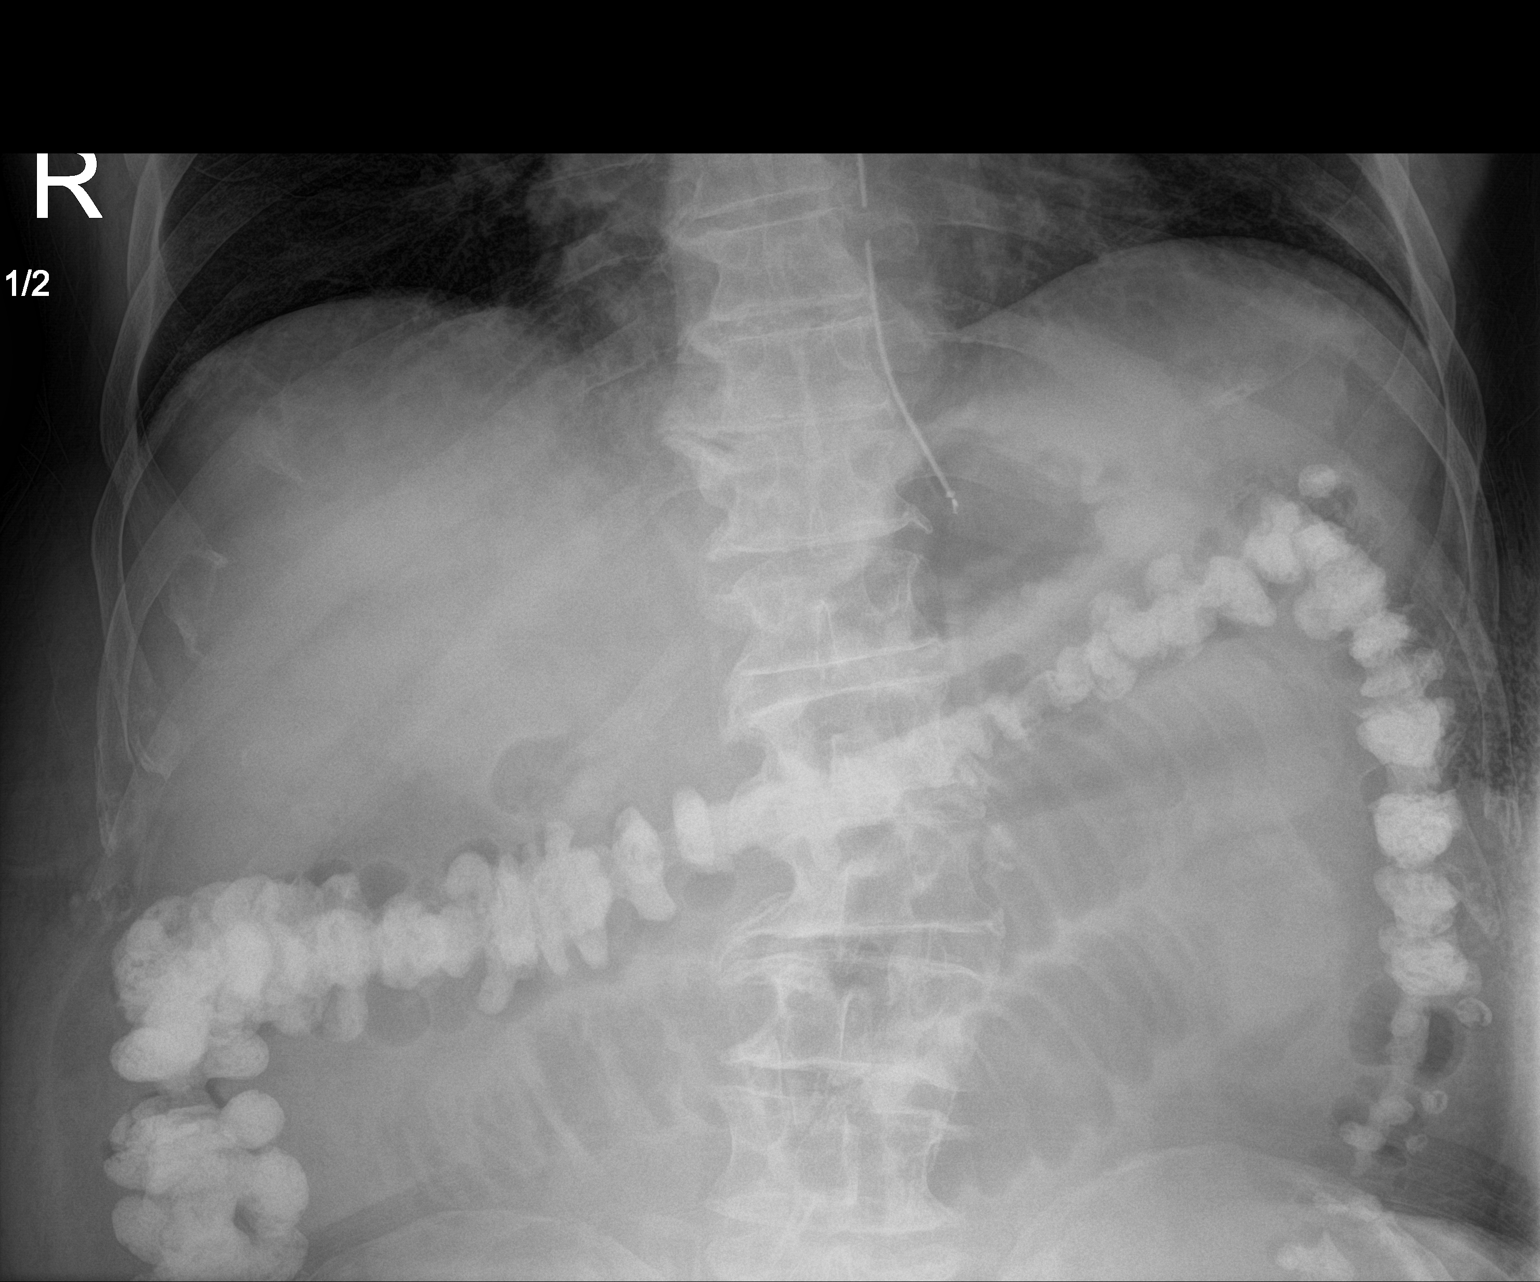
[im 2/2]
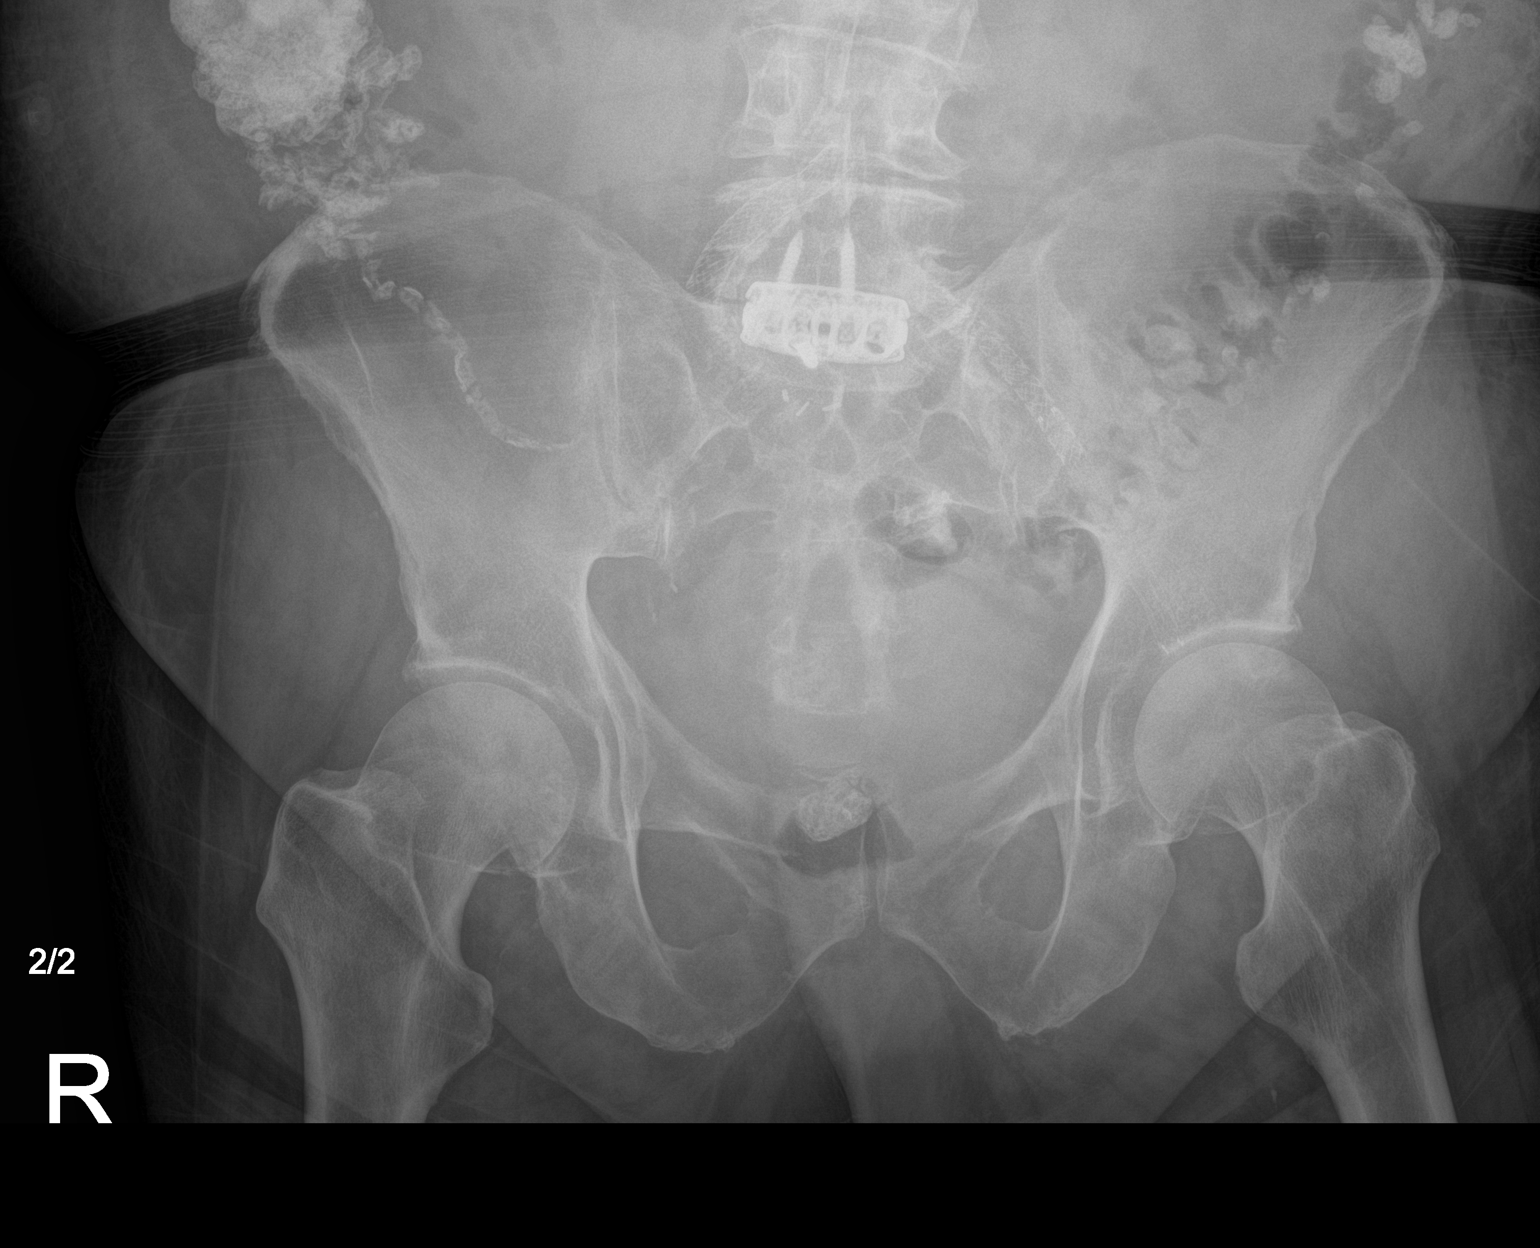

[2 of 2 positions shown; findings below may reference images not displayed]

FINDINGS: An NG tube is identified with side hole overlying the distal
esophagus and tip just into the stomach.

Distended small bowel loops are again noted.

Contrast within the colon is present.
IMPRESSION: 1. NG tube with side hole overlying the distal esophagus and tip
just into the stomach. Recommend advancement.
2. Distended small bowel loops again noted.

## 2023-09-12 IMAGING — DX DG ABD PORTABLE 1V
1 series · 2 of 2 positions shown · non-contrast
Comparison: X-ray abdomen 06/17/2022

CLINICAL DATA: Encounter for bowel obstruction.

EXAM:
PORTABLE ABDOMEN - 1 VIEW

[Series 1: abdomen · 0.14mm/px · 2 of 2 slices shown]
[im 1/2]
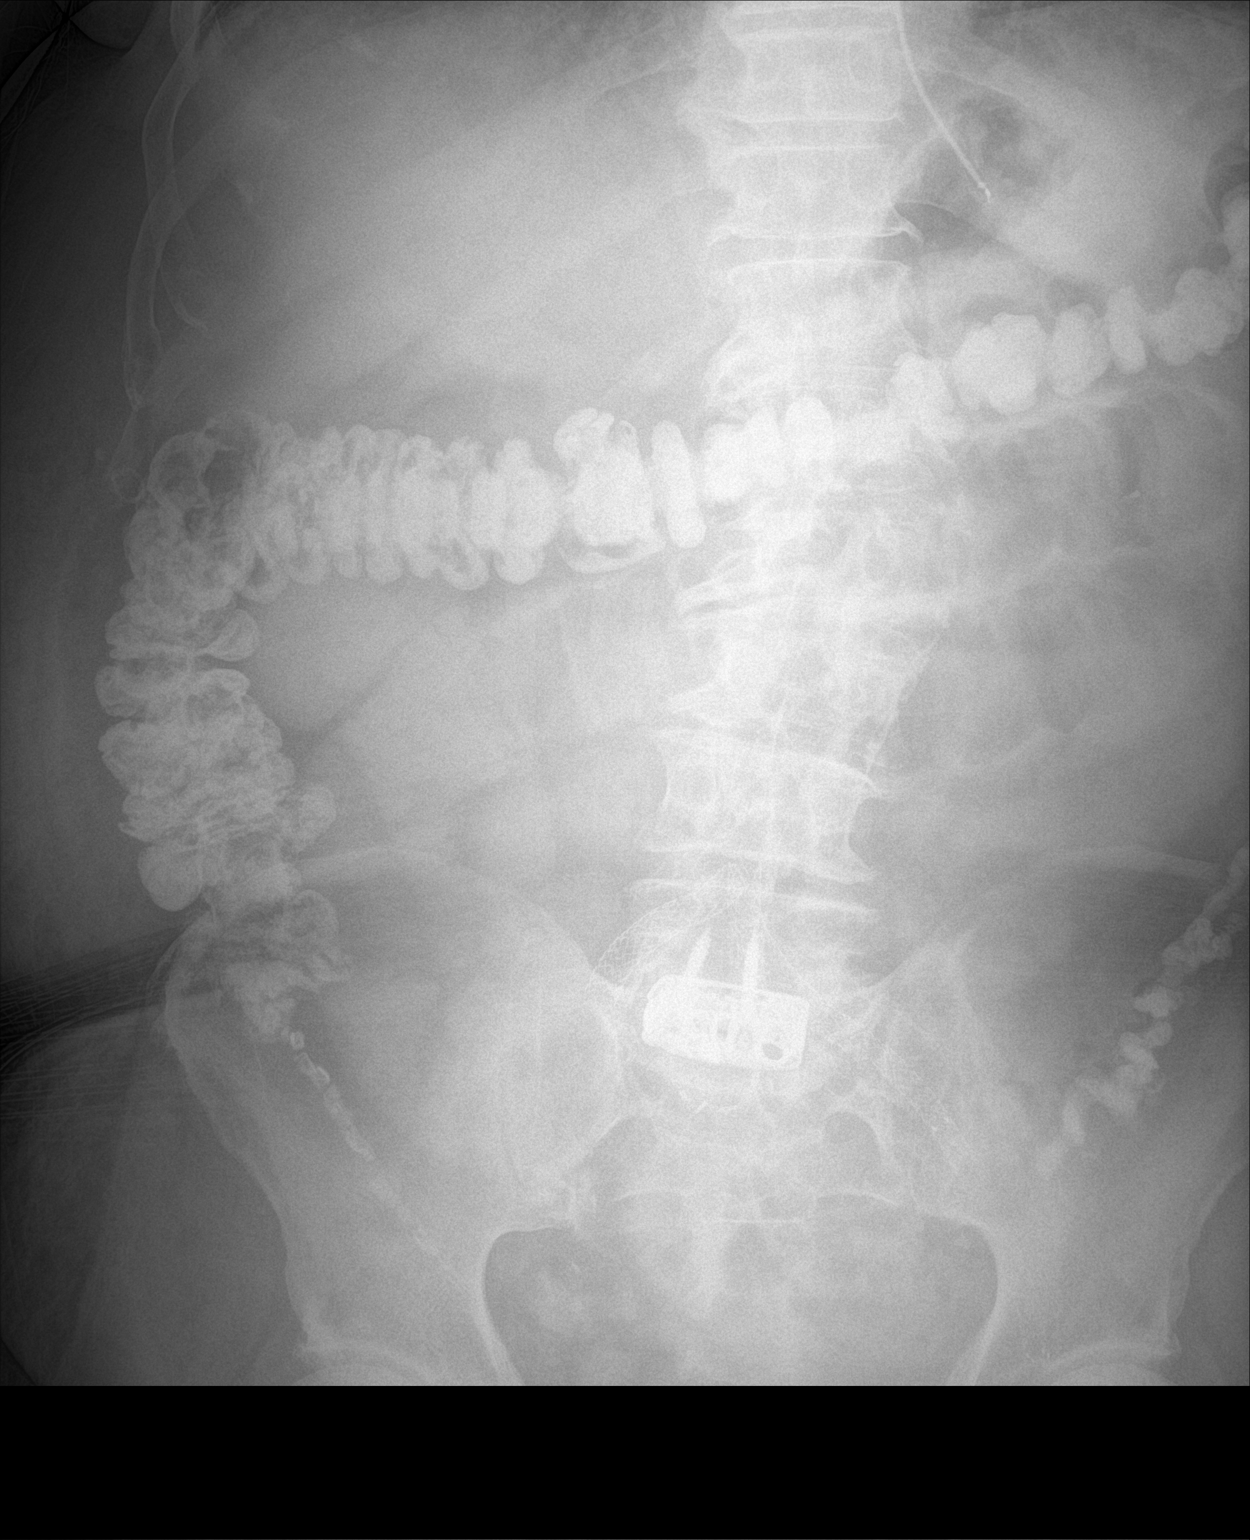
[im 2/2]
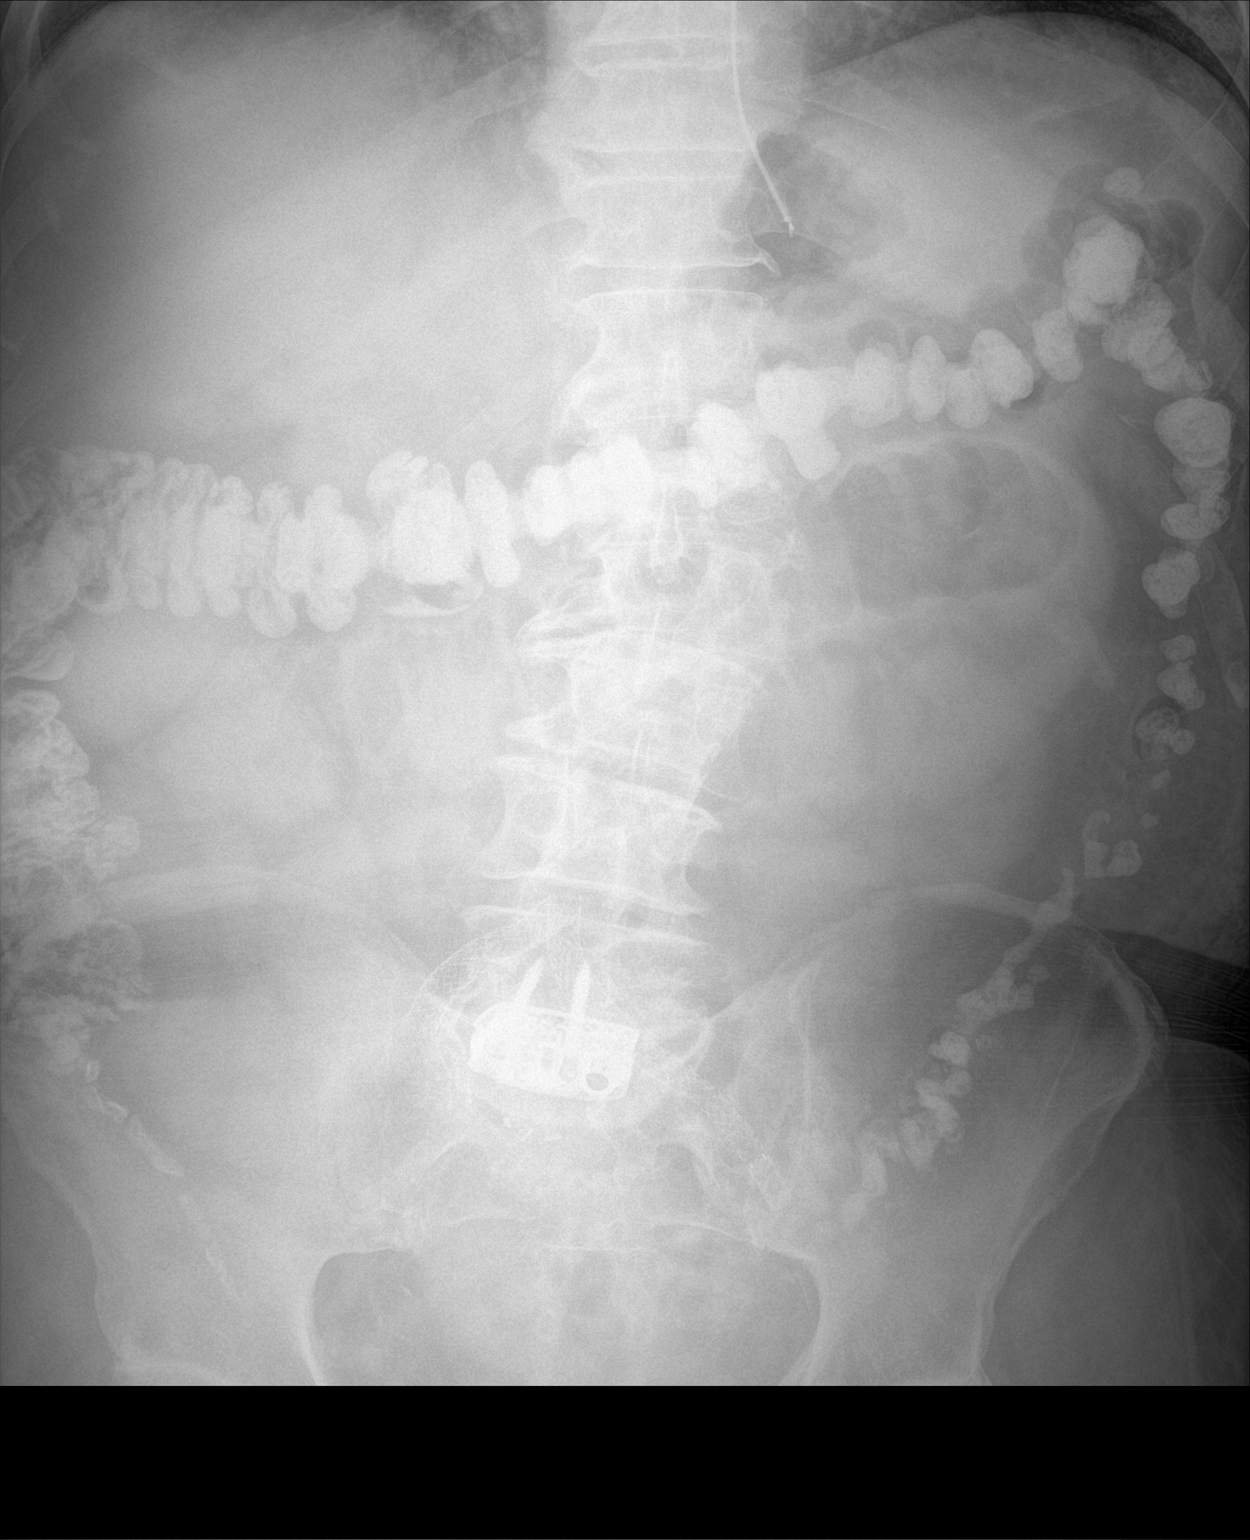

[2 of 2 positions shown; findings below may reference images not displayed]

FINDINGS: Partially visualized enteric tube with tip overlying the gastric
lumen and side port collimated off view likely overlying the distal
esophagus.

PO contrast reaches the proximal sigmoid colon. Persistent small
bowel dilatation measuring up to 3.8 cm. Limited evaluation for free
gas on this supine radiograph. No radio-opaque calculi or other
significant radiographic abnormality are seen.

Bi-iliac stents noted.

Lumbosacral surgical hardware.  And changes of the lumbar spine.
IMPRESSION: 1. Query partial small bowel obstruction.
2. Partially visualized enteric tube with tip overlying the gastric
lumen and side port collimated off view likely overlying the distal
esophagus. Consider advancing by 3 cm.

## 2023-09-12 IMAGING — DX DG ABD PORTABLE 1V
1 series · 1 of 1 positions shown · non-contrast
Comparison: Radiographs 06/16/2022 and 06/14/2022.  CT 06/13/2022.

CLINICAL DATA: Ileus.

EXAM:
PORTABLE ABDOMEN - 1 VIEW

[abdomen]
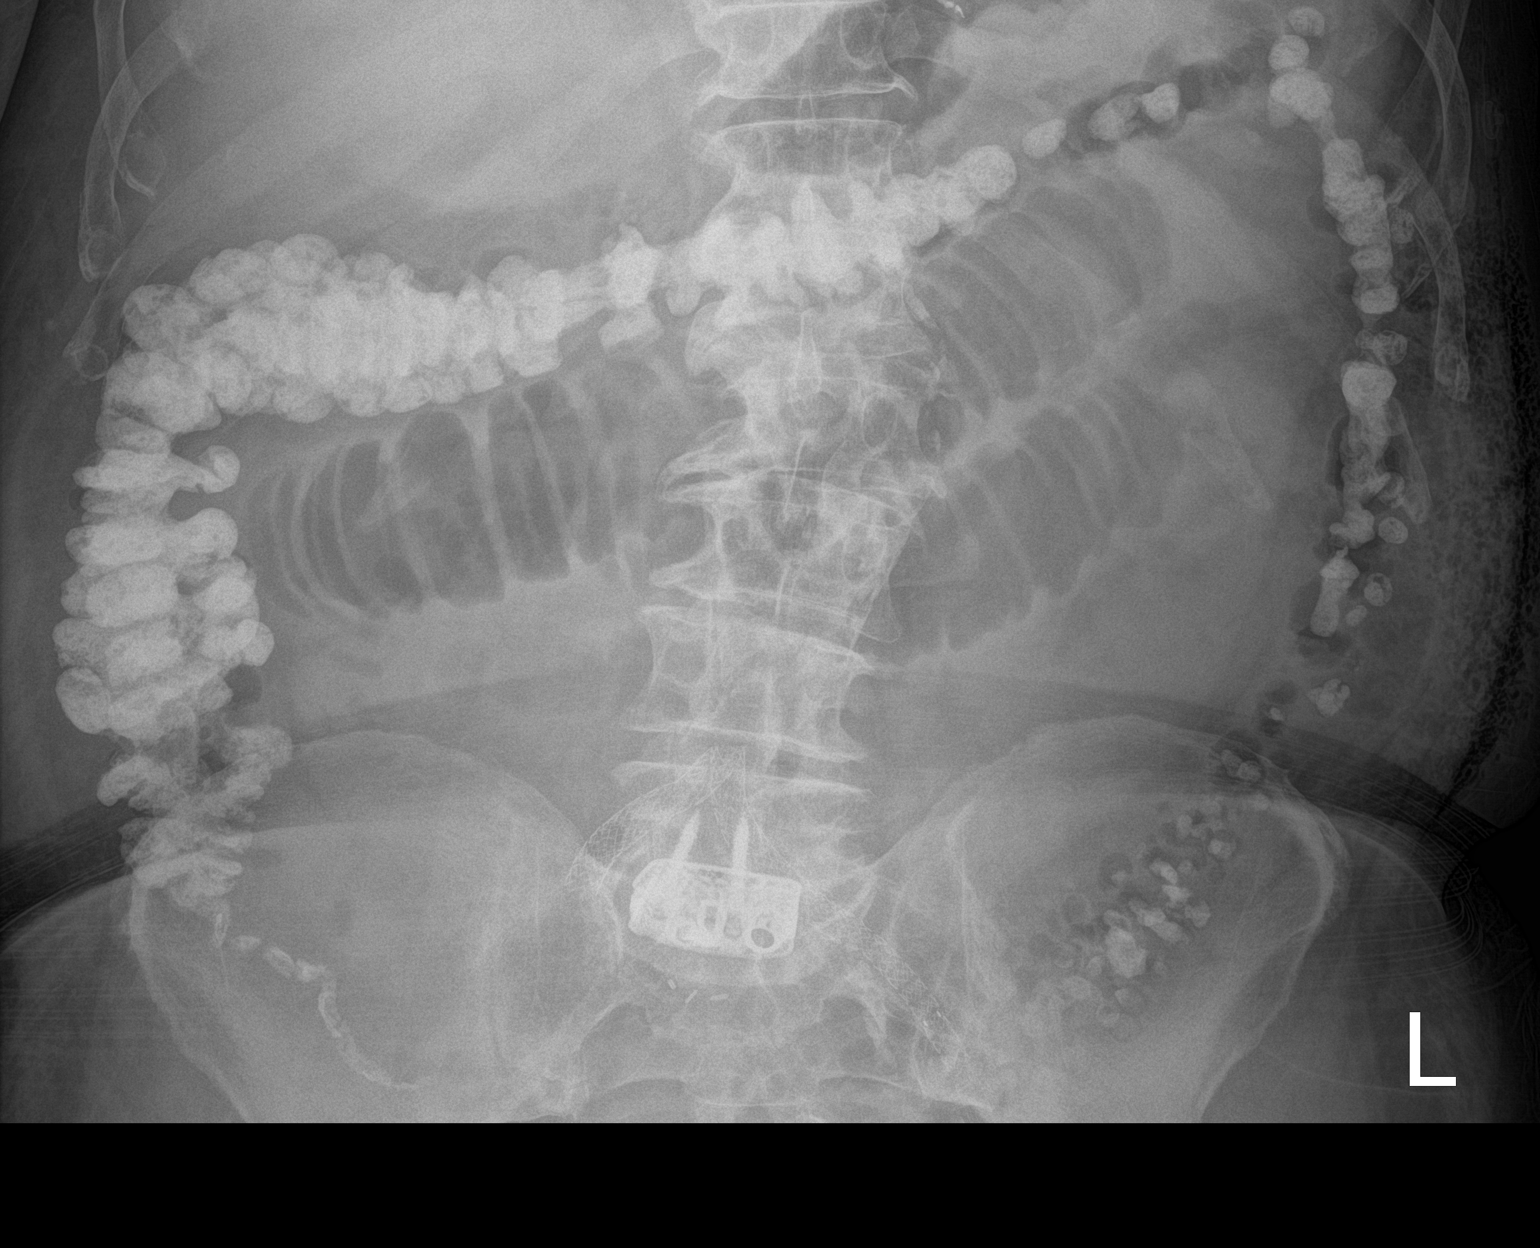

[1 of 1 positions shown; findings below may reference images not displayed]

FINDINGS: 2629 hours. Tip of the enteric tube is noted overlapping the
proximal stomach. Unchanged moderate diffuse small bowel distension.
Contrast material is present within a decompressed colon. No
extraluminal contrast or definite extraluminal intraperitoneal air
is demonstrated. There is gas within the left lateral abdominal
wall, as before. Previous iliac stenting and lower lumbar fusion are
noted.
IMPRESSION: Unchanged bowel gas pattern with persistent small bowel distension
and contrast within a decompressed colon. Findings remain suspicious
for a partial small bowel obstruction.

## 2023-09-13 IMAGING — DX DG ABD PORTABLE 1V
1 series · 2 of 2 positions shown · non-contrast
Comparison: Previous studies including abdominal radiographs done
on 06/17/2022

CLINICAL DATA: Postoperative ileus

EXAM:
PORTABLE ABDOMEN - 1 VIEW

[Series 1: abdomen · 0.14mm/px · 2 of 2 slices shown]
[im 1/2]
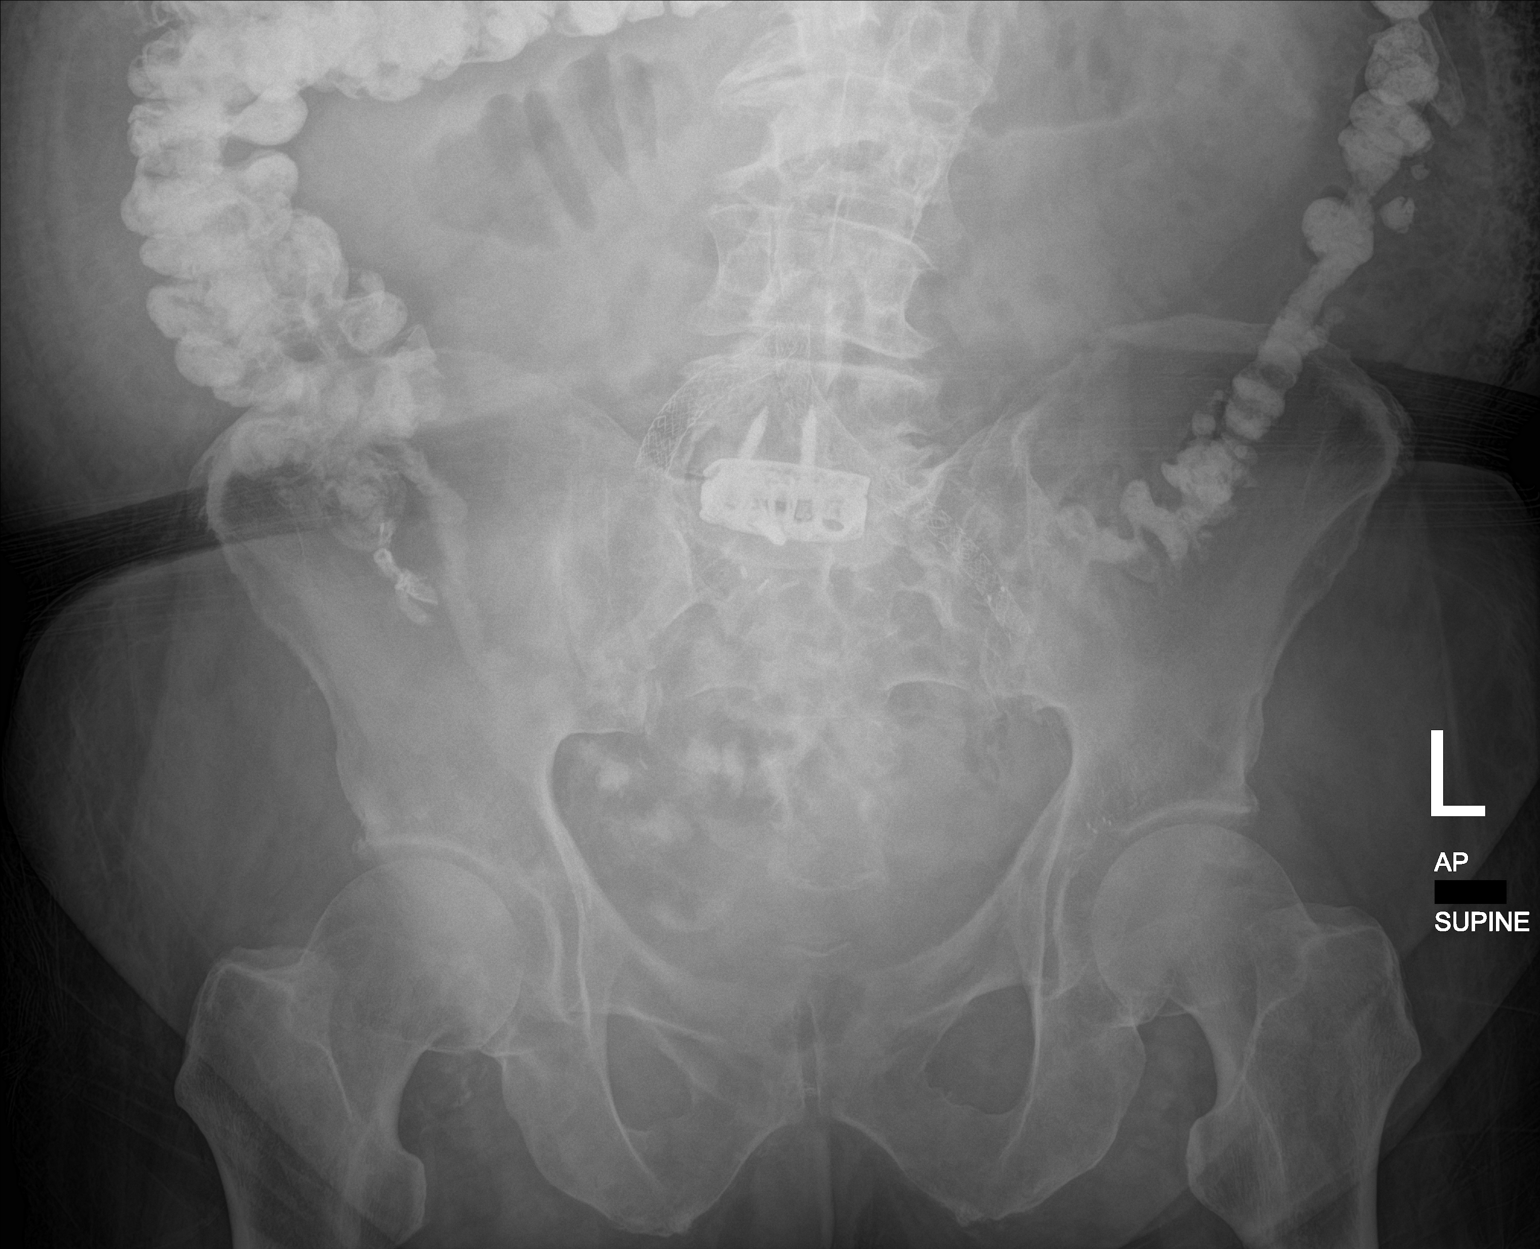
[im 2/2]
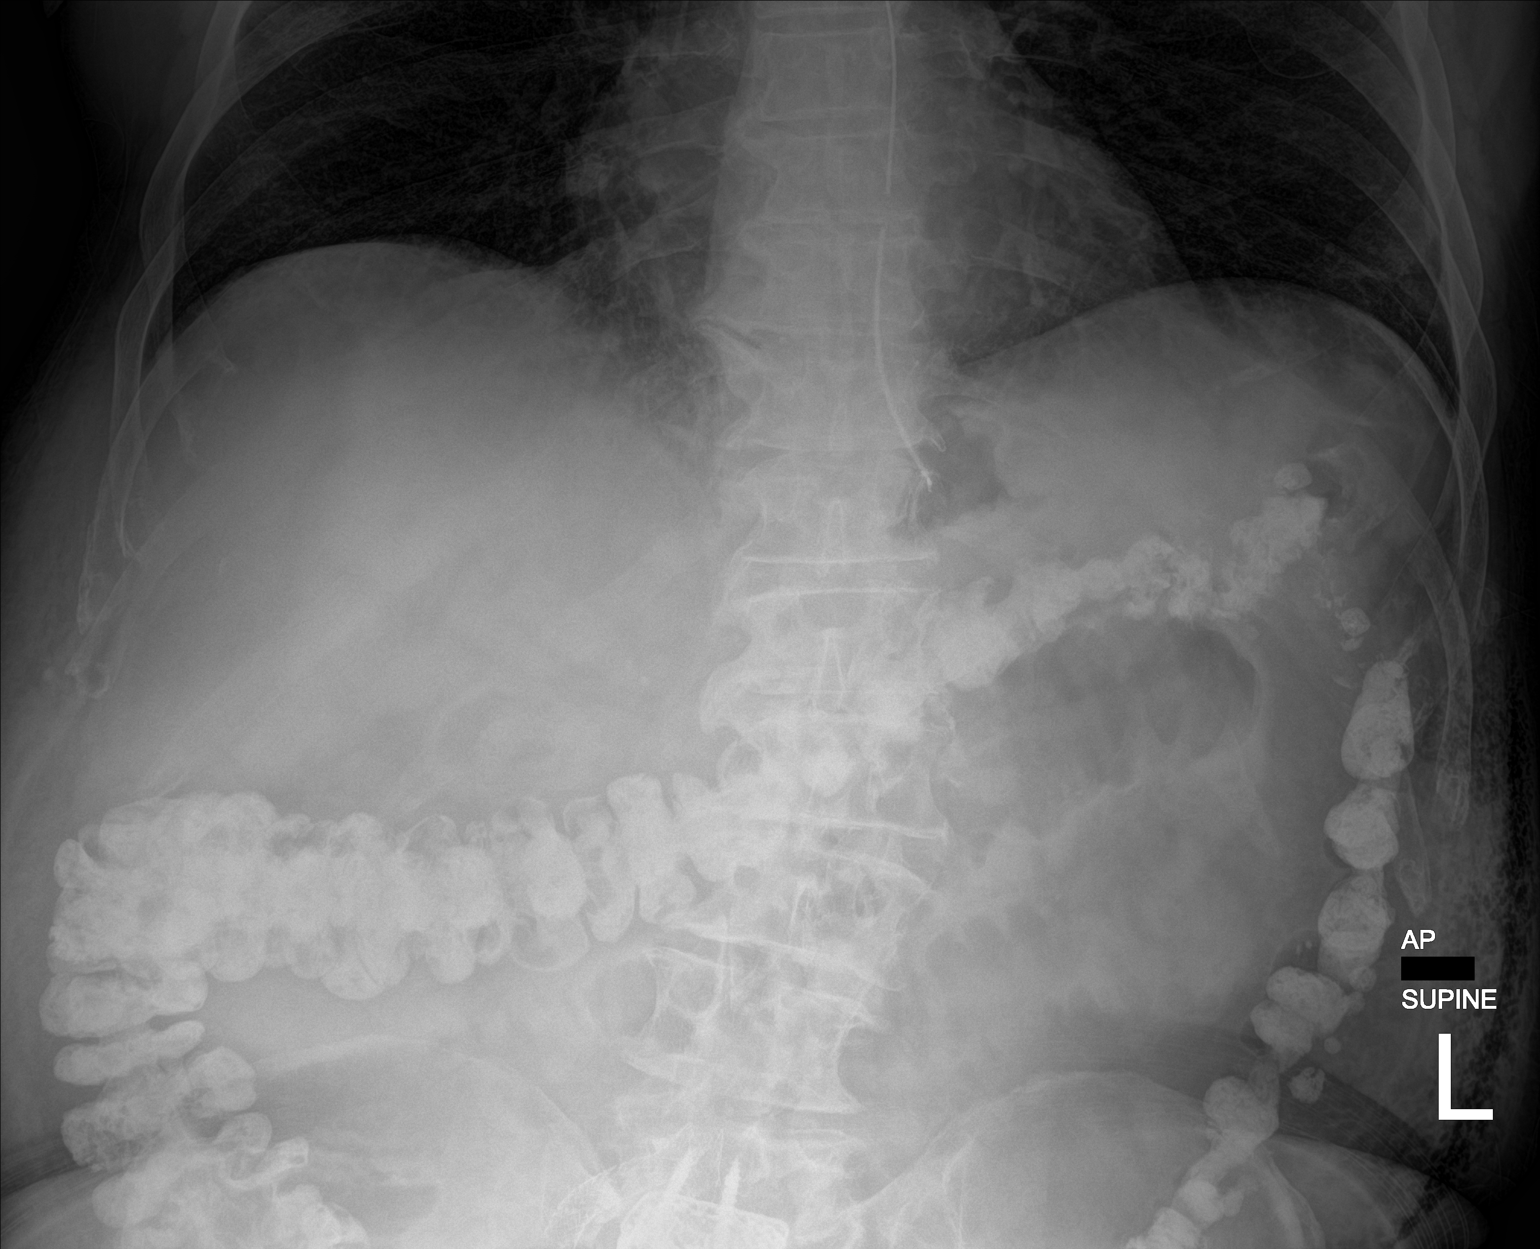

[2 of 2 positions shown; findings below may reference images not displayed]

FINDINGS: Tip of NG tube is seen adjacent to the gastroesophageal junction.
Side-port in the NG tube is seen in the lower thoracic esophagus.
Stomach is not distended. There is dilation of proximal small bowel
loops measuring up to 4.3 cm in diameter. There is contrast in the
lumen of colon. Vascular stents are seen in the course of iliac
arteries. No abnormal masses or calcifications are seen. Kidneys are
partly obscured by bowel contents.
IMPRESSION: There is dilation of proximal small bowel loops suggesting partial
small bowel obstruction.

Tip of NG tube is seen in the medial aspect of fundus of the
stomach. Side-port in the NG tube is seen in the lower thoracic
esophagus. NG tube should be advanced 10 cm to place the tip and
side port within the lumen of the stomach.

## 2023-09-20 DIAGNOSIS — S0502XA Injury of conjunctiva and corneal abrasion without foreign body, left eye, initial encounter: Secondary | ICD-10-CM | POA: Diagnosis not present

## 2023-09-20 DIAGNOSIS — H20042 Secondary noninfectious iridocyclitis, left eye: Secondary | ICD-10-CM | POA: Diagnosis not present

## 2023-09-20 DIAGNOSIS — H4312 Vitreous hemorrhage, left eye: Secondary | ICD-10-CM | POA: Diagnosis not present

## 2023-09-20 DIAGNOSIS — H2102 Hyphema, left eye: Secondary | ICD-10-CM | POA: Diagnosis not present

## 2023-09-21 DIAGNOSIS — H4312 Vitreous hemorrhage, left eye: Secondary | ICD-10-CM | POA: Diagnosis not present

## 2023-09-21 DIAGNOSIS — H20042 Secondary noninfectious iridocyclitis, left eye: Secondary | ICD-10-CM | POA: Diagnosis not present

## 2023-09-21 DIAGNOSIS — H2102 Hyphema, left eye: Secondary | ICD-10-CM | POA: Diagnosis not present

## 2023-09-21 DIAGNOSIS — S0502XD Injury of conjunctiva and corneal abrasion without foreign body, left eye, subsequent encounter: Secondary | ICD-10-CM | POA: Diagnosis not present

## 2023-09-22 DIAGNOSIS — H18232 Secondary corneal edema, left eye: Secondary | ICD-10-CM | POA: Diagnosis not present

## 2023-09-22 DIAGNOSIS — S0502XA Injury of conjunctiva and corneal abrasion without foreign body, left eye, initial encounter: Secondary | ICD-10-CM | POA: Diagnosis not present

## 2023-10-06 DIAGNOSIS — H35342 Macular cyst, hole, or pseudohole, left eye: Secondary | ICD-10-CM | POA: Diagnosis not present

## 2023-10-24 ENCOUNTER — Other Ambulatory Visit: Payer: Medicare Other

## 2023-11-04 DIAGNOSIS — M549 Dorsalgia, unspecified: Secondary | ICD-10-CM | POA: Diagnosis not present

## 2023-11-04 DIAGNOSIS — I700 Atherosclerosis of aorta: Secondary | ICD-10-CM | POA: Diagnosis not present

## 2023-11-04 DIAGNOSIS — I10 Essential (primary) hypertension: Secondary | ICD-10-CM | POA: Diagnosis not present

## 2023-11-04 DIAGNOSIS — G629 Polyneuropathy, unspecified: Secondary | ICD-10-CM | POA: Diagnosis not present

## 2023-11-04 DIAGNOSIS — J439 Emphysema, unspecified: Secondary | ICD-10-CM | POA: Diagnosis not present

## 2023-11-06 DIAGNOSIS — H35342 Macular cyst, hole, or pseudohole, left eye: Secondary | ICD-10-CM | POA: Diagnosis not present

## 2023-11-06 DIAGNOSIS — T8529XA Other mechanical complication of intraocular lens, initial encounter: Secondary | ICD-10-CM | POA: Diagnosis not present

## 2023-11-06 DIAGNOSIS — S0502XD Injury of conjunctiva and corneal abrasion without foreign body, left eye, subsequent encounter: Secondary | ICD-10-CM | POA: Diagnosis not present

## 2023-11-06 DIAGNOSIS — Z961 Presence of intraocular lens: Secondary | ICD-10-CM | POA: Diagnosis not present

## 2023-11-06 DIAGNOSIS — H3342 Traction detachment of retina, left eye: Secondary | ICD-10-CM | POA: Diagnosis not present

## 2023-11-10 DIAGNOSIS — M7062 Trochanteric bursitis, left hip: Secondary | ICD-10-CM | POA: Diagnosis not present

## 2023-11-12 DIAGNOSIS — H3552 Pigmentary retinal dystrophy: Secondary | ICD-10-CM | POA: Diagnosis not present

## 2023-11-12 DIAGNOSIS — H3522 Other non-diabetic proliferative retinopathy, left eye: Secondary | ICD-10-CM | POA: Diagnosis not present

## 2023-11-12 DIAGNOSIS — H3342 Traction detachment of retina, left eye: Secondary | ICD-10-CM | POA: Diagnosis not present

## 2023-11-12 DIAGNOSIS — H33022 Retinal detachment with multiple breaks, left eye: Secondary | ICD-10-CM | POA: Diagnosis not present

## 2023-11-19 DIAGNOSIS — H3342 Traction detachment of retina, left eye: Secondary | ICD-10-CM | POA: Diagnosis not present

## 2023-11-19 DIAGNOSIS — T8529XA Other mechanical complication of intraocular lens, initial encounter: Secondary | ICD-10-CM | POA: Diagnosis not present

## 2023-11-20 ENCOUNTER — Ambulatory Visit: Payer: Medicare Other | Admitting: Family Medicine

## 2023-11-20 ENCOUNTER — Ambulatory Visit
Admission: RE | Admit: 2023-11-20 | Discharge: 2023-11-20 | Disposition: A | Discharge status: Home / Self Care | Payer: Medicare Other | Source: Ambulatory Visit | Attending: Family Medicine | Admitting: Family Medicine

## 2023-11-20 ENCOUNTER — Encounter: Payer: Self-pay | Admitting: Family Medicine

## 2023-11-20 VITALS — BP 113/71 | Ht 69.0 in | Wt 206.0 lb

## 2023-11-20 DIAGNOSIS — M25552 Pain in left hip: Secondary | ICD-10-CM | POA: Diagnosis not present

## 2023-11-20 DIAGNOSIS — M87052 Idiopathic aseptic necrosis of left femur: Secondary | ICD-10-CM | POA: Diagnosis not present

## 2023-11-20 NOTE — Progress Notes (Signed)
DATE OF VISIT: 11/20/2023        David King DOB: 10-07-1956 MRN: [id number]  CC:  Lt hip pain  History- David King is a 67 y.o.  male for evaluation and treatment of chronic left hip pain PMH significant for chronic LBP s/p L5-S1 anterior lumbar fusion end of 2022 for Lt-sided radiculopathy  He reports chronic Lt hip pain x 10 years Has seen multiple doctors for this in the past Thought maybe radicular from the back - hx of back surgery x 2 -- last was end of 2022 for L5-S1 --> no help with symptoms in the hip; surgery complicated by hernia that required mesh - hx left hip surgery ~5-6 years ago for "bursa" -- no help - tried injections x 2, sounds like greater troch injections -- did not help -- unsure if he has ever had intra-articular hip injections - no improvement with dry needling - no improvement with prior PT -- last was some time ago - no recent imaging  Pain mainly on the side of the hip  MRI LT hip 04/24/20 IMPRESSION: 1. 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 No flattening or contour change. 2. Mild axial thinning of articular cartilage in the left hip joint. 3. Lower lumbar spondylosis and degenerative disc disease. 4. Sigmoid colon diverticulosis.  CT 06/10/22 of pelvis w/ contrast shwoing  - small foci of AVN b/l femoral heads  CT abd/pelvis 11/19/2022 - increased AVN noted on left femoral head  Pain significantly affecting his quality of life States pain "has ruined life" - trouble walking >2 blocks due to pain No longer having radicular symptoms Taking Ibuprofen 600mg  PM at night to help with sleep Taking Ibuprofen 600mg  bid-tid prn    Past Medical History Past Medical History:  Diagnosis Date   Anxiety    new dx   Arthritis    lumbar   Cataract    COPD (chronic obstructive pulmonary disease) (HCC)    Diverticulosis    Elevated PSA    Headache(784.0)    MIGRAINES   Iritis    CHRONIC IN LEFT  EYE - SOME VISIAL IMPAIRMENT IN LEFT EYE   Neuromuscular disorder (HCC)    left leg/foot,pinched siactic nerve   Pain    PAIN LEFT HIP AND DOWN LT LEG WITH NUMBNESS IN LEFT LEG--PT STATES SCIATIC NERVE IMPINGEMENT - PT PLANS BACK IN THE NEAR SURGERY.   Prostate cancer (HCC) 03/05/2013   Adenocarcinoma   Renal cysts, acquired, bilateral 03/19/2013   several simple , CT   Urinary frequency    AND NOCTURIA    Past Surgical History Past Surgical History:  Procedure Laterality Date   BACK SURGERY     CARDIAC CATHETERIZATION     CATARACT EXTRACTION W/ INTRAOCULAR LENS IMPLANT Left    EYE SURGERY     RETINAL SURGERY LEFT EYE   HYDROCELE EXCISION Left 03/05/2013   Procedure: HYDROCELECTOMY ADULT;  Surgeon: Antony Haste, MD;  Location: [facility];  Service: Urology;  Laterality: Left;   INCISIONAL HERNIA REPAIR N/A 06/18/2022   Procedure: REPAIR OF INTERNAL HERNIA;  Surgeon: Axel Filler, MD;  Location: [facility] OR;  Service: General;  Laterality: N/A;   INGUINAL HERNIA REPAIR Bilateral AGE 23   INSERTION OF MESH N/A 06/11/2022   Procedure: INSERTION OF MESH;  Surgeon: Axel Filler, MD;  Location: [facility] OR;  Service: General;  Laterality: N/A;   INSERTION OF MESH N/A 06/18/2022   Procedure: INSERTION  OF MESH;  Surgeon: Axel Filler, MD;  Location: [facility] OR;  Service: General;  Laterality: N/A;   KYPHOPLASTY N/A 05/08/2023   Procedure: THORACIC SEVEN KYPHOPLASTY;  Surgeon: Estill Bamberg, MD;  Location: [facility] OR;  Service: Orthopedics;  Laterality: N/A;   LAPAROSCOPY N/A 06/18/2022   Procedure: LAPAROSCOPY DIAGNOSTIC;  Surgeon: Axel Filler, MD;  Location: [facility] OR;  Service: General;  Laterality: N/A;   LYMPHADENECTOMY Bilateral 05/06/2013   Procedure: Hart Carwin;  Surgeon: Crecencio Mc, MD;  Location: [facility];  Service: Urology;  Laterality: Bilateral;   PROSTATE BIOPSY N/A 03/05/2013   Procedure: PROSTATE BIOPSY and ultrasound;  Surgeon: Antony Haste, MD;   Location: [facility];  Service: Urology;  Laterality: N/A;   REMOVAL BURSA SAC, LEFT ELBOW  1996   ROBOT ASSISTED LAPAROSCOPIC RADICAL PROSTATECTOMY N/A 05/06/2013   Procedure: ROBOTIC ASSISTED LAPAROSCOPIC RADICAL PROSTATECTOMY LEVEL 3;  Surgeon: Crecencio Mc, MD;  Location: [facility];  Service: Urology;  Laterality: N/A;   TONSILLECTOMY     as achild   XI ROBOTIC ASSISTED VENTRAL HERNIA N/A 06/11/2022   Procedure: ROBOTIC INCISIONAL HERNIA REPAIR WITH MESH;  Surgeon: Axel Filler, MD;  Location: [facility] OR;  Service: General;  Laterality: N/A;    Medications Current Outpatient Medications  Medication Sig Dispense Refill   albuterol (VENTOLIN HFA) 108 (90 Base) MCG/ACT inhaler USE 1-2 PUFFS EVERY 6 HOURS AS NEEDED FOR WHEEZING OR SHORTNESS OF BREATH. 8.5 g 2   aspirin EC 81 MG tablet Take by mouth.     Coal Tar Extract [phone] PSORIASIS MEDICATED EX) Apply 1 Application topically 3 (three) times daily as needed (psoriasis).     diphenhydramine-acetaminophen (TYLENOL PM EXTRA STRENGTH) 25-500 MG TABS tablet Take 2 tablets by mouth at bedtime.     gabapentin (NEURONTIN) 300 MG capsule Take 600 mg by mouth 3 (three) times daily.     lisinopril (ZESTRIL) 20 MG tablet Take 20 mg by mouth in the morning.     pravastatin (PRAVACHOL) 20 MG tablet Take 20 mg by mouth in the morning.     TRELEGY ELLIPTA 100-62.5-25 MCG/ACT AEPB USE 1 INHALATION BY MOUTH INTO  THE LUNGS ONCE DAILY AT THE SAME TIME EACH DAY 180 each 3   No current facility-administered medications for this visit.    Allergies has No Known Allergies.  Family History - reviewed per EMR and intake form  Social History   reports current alcohol use of about 14.0 standard drinks of alcohol per week.  reports that he quit smoking about 6 years ago. His smoking use included cigarettes. He started smoking about 53 years ago. He has a 94 pack-year smoking history. He has never used smokeless tobacco.  reports no history of drug  use.   EXAM: Vitals: BP 113/71   Ht 5\' 9"  (1.753 m)   Wt 206 lb (93.4 kg)   BMI 30.42 kg/m  General: AOx3, NAD, pleasant SKIN: no rashes or lesions, skin clean, dry, intact MSK: Left hip with near full range of motion with pain on internal and external rotation.  No tenderness over the anterior aspect.  No tenderness over the greater trochanter.  Hip strength is 5-/5 throughout. L-spine with no midline or paraspinal tenderness.  Slight decreased flexion and extension without pain. Walking with antalgic gait  NEURO: sensation intact to light touch, DTR + 2/4 Achilles and patella bilaterally VASC: pulses 2+ and symmetric DP/PT bilaterally, no edema  IMAGING: MRI LT hip 04/24/20 IMPRESSION: 1. Small focus of chronic avascular necrosis  in the anterior-superior left femoral head. No flattening or contour change. 2. Mild axial thinning of articular cartilage in the left hip joint. 3. Lower lumbar spondylosis and degenerative disc disease. 4. Sigmoid colon diverticulosis. -Images personally reviewed by me today and reviewed with patient during visit.    CT 06/10/22 of pelvis w/ contrast showing:  - Personal review of images specifically for musculoskeletal abnormalities showed small foci of AVN b/l femoral heads without any signs of collapse.  Also had lumbar degenerative changes with signs of prior lumbar fusion at L5-S1 Other findings as noted by radiologist including: IMPRESSION: -There is no evidence of intestinal obstruction or pneumoperitoneum. Appendix is not dilated. Diverticulosis of colon without signs of focal diverticulitis. - There are few large left renal stones measuring up to 1.8 cm in diameter. There is mild wall thickening in the left renal pelvis which may suggest acute or chronic pyelonephritis. There is no significant hydronephrosis. Bilateral renal cysts are seen. - There is 3.8 cm infrarenal aortic aneurysm.  CT abd/pelvis w/ contrast 11/19/2022 IMPRESSION:   -Personal review of images specifically for musculoskeletal abnormalities showed increased AVN noted on left femoral head when compared to prior CT 06/11/22.  No signs of cortical collapse.  Also noted to have degenerative changes of the lumbar spine and prior fusion at L5-S1 Other findings as noted by radiologist including: IMPRESSION: 1. Postsurgical changes of anterior pelvic wall. No evidence of recurrent hernia. 2. A 1.6 cm partially obstructing stone in the left renal pelvis with mild left pelviectasis. Additional nonobstructing left renal inferior pole calculi measure up to 15 mm. 3. Cirrhosis with small ascites. 4. Sigmoid diverticulosis. No bowel obstruction. Normal appendix. 5. Infrarenal aortic aneurysm measuring up to 3.4 cm in diameter. Recommend follow-up ultrasound every 3 years Assessment & Plan Left hip pain Chronic left hip pain x 10+ years, complicated by underlying lumbar DJD and radiculopathy status post lumbar surgery including lumbar fusion -Radicular symptoms have improved after the lumbar surgery, but has ongoing hip pain -Prior imaging studies including CT of the abdomen and pelvis, as well as MRI left hip showing signs of AVN -He has had no recent imaging -No improvement with prior hip injections x 2, which sound to be greater trochanteric bursal injections -Suspect ongoing pain likely related to AVN  Plan: -Prior imaging reviewed in detail as noted above -Prior visit notes in care everywhere reviewed during visit today -Imaging studies reviewed with patient in detail during visit today as noted above -Updated left hip and pelvis x-ray to further evaluate status of the left hip.  May require MRI of the left hip pending x-rays.  Patient plans to get these today.  Will reach out with the results -Will continue ibuprofen as needed -He will also continue his gabapentin which he is doing for his history of back issues -Follow-up pending imaging discussed further  treatment Avascular necrosis of bone of hip, left (HCC) Chronic left hip pain x 10+ years, complicated by underlying lumbar DJD and radiculopathy status post lumbar surgery including lumbar fusion  -Prior imaging studies including CT of the abdomen and pelvis, as well as MRI left hip showing signs of AVN -He has had no recent imaging -No improvement with prior hip injections x 2, which sound to be greater trochanteric bursal injections -Suspect ongoing pain likely related to AVN  Plan: -Prior imaging reviewed in detail as noted above -Prior visit notes in care everywhere reviewed during visit today -Imaging studies reviewed with patient in detail during visit  today as noted above -Updated left hip and pelvis x-ray to further evaluate status of the left hip.  May require MRI of the left hip pending x-rays.  Patient plans to get these today.  Will reach out with the results -Will continue ibuprofen as needed -He will also continue his gabapentin which he is doing for his history of back issues -Follow-up pending imaging discussed further treatment.  If AVN is present, would consider referral back to orthopedic surgery to further discuss surgical options  Patient expressed understanding & agreement with above.  ------------------------------------------ After visit addendum 11/20/23 @ 6:05PM EST: Patient had left hip and pelvis x-rays completed after the visit today.  Mages personally reviewed by me showing possible signs of AVN along the left femoral head and area as seen on previous CT and MRI as noted above.  No signs of collapse.  No significant hip arthritis.  Signs of prior lumbar fusion at L5-S1.  Also has left common and external iliac artery stents.  Based on these findings, as well as chronic symptoms, recommend MRI left hip to further evaluate degree of AVN and for any other abnormalities.  Patient has failed extensive conservative therapy would be interested in surgical intervention  if indicated.   Encounter Diagnoses  Name Primary?   Left hip pain Yes   Avascular necrosis of bone of hip, left (HCC)     Orders Placed This Encounter  Procedures   DG HIP UNILAT WITH PELVIS 2-3 VIEWS LEFT    Orders Placed This Encounter  Procedures   DG HIP UNILAT WITH PELVIS 2-3 VIEWS LEFT

## 2023-11-20 NOTE — Progress Notes (Signed)
Images personally reviewed by me showing possible signs of AVN along the left femoral head and area as seen on previous CT and MRI as noted above.  No signs of collapse.  No significant hip arthritis.  Signs of prior lumbar fusion at L5-S1.  Also has left common and external iliac artery stents.  Based on these findings, as well as chronic symptoms, recommend MRI left hip to further evaluate degree of AVN and for any other abnormalities.  Patient has failed extensive conservative therapy would be interested in surgical intervention if indicated.  --------------------------------- Aundra Millet-  Please place order for MRI Lt hip without contrast, Dx: Lt hip AVN.  Reason: Chronic left hip pain, prior MRI and CT showing AVN.  No improvement with conservative tx.  R/o worsening AVN, other soft tissue abnormality.    Please let me know if you have any questions.  Thanks! -Fae Pippin

## 2023-11-21 ENCOUNTER — Other Ambulatory Visit: Payer: Self-pay

## 2023-11-21 DIAGNOSIS — M87052 Idiopathic aseptic necrosis of left femur: Secondary | ICD-10-CM

## 2023-11-25 DIAGNOSIS — L400 Psoriasis vulgaris: Secondary | ICD-10-CM | POA: Diagnosis not present

## 2023-11-26 ENCOUNTER — Ambulatory Visit: Payer: Medicare Other | Admitting: Podiatry

## 2023-12-01 DIAGNOSIS — L400 Psoriasis vulgaris: Secondary | ICD-10-CM | POA: Diagnosis not present

## 2023-12-01 DIAGNOSIS — Z79899 Other long term (current) drug therapy: Secondary | ICD-10-CM | POA: Diagnosis not present

## 2023-12-05 DIAGNOSIS — L400 Psoriasis vulgaris: Secondary | ICD-10-CM | POA: Diagnosis not present

## 2023-12-05 DIAGNOSIS — Z79899 Other long term (current) drug therapy: Secondary | ICD-10-CM | POA: Diagnosis not present

## 2023-12-17 ENCOUNTER — Ambulatory Visit
Admission: RE | Admit: 2023-12-17 | Discharge: 2023-12-17 | Disposition: A | Discharge status: Home / Self Care | Payer: Medicare Other | Source: Ambulatory Visit | Attending: Family Medicine | Admitting: Family Medicine

## 2023-12-17 DIAGNOSIS — M1612 Unilateral primary osteoarthritis, left hip: Secondary | ICD-10-CM | POA: Diagnosis not present

## 2023-12-17 DIAGNOSIS — M25552 Pain in left hip: Secondary | ICD-10-CM | POA: Diagnosis not present

## 2023-12-17 DIAGNOSIS — M87052 Idiopathic aseptic necrosis of left femur: Secondary | ICD-10-CM

## 2024-01-05 DIAGNOSIS — H35342 Macular cyst, hole, or pseudohole, left eye: Secondary | ICD-10-CM | POA: Diagnosis not present

## 2024-01-05 DIAGNOSIS — H3342 Traction detachment of retina, left eye: Secondary | ICD-10-CM | POA: Diagnosis not present

## 2024-01-06 ENCOUNTER — Ambulatory Visit: Payer: Medicare Other | Admitting: Family Medicine

## 2024-01-06 VITALS — BP 138/82 | Ht 69.0 in | Wt 206.0 lb

## 2024-01-06 DIAGNOSIS — M1612 Unilateral primary osteoarthritis, left hip: Secondary | ICD-10-CM | POA: Diagnosis not present

## 2024-01-06 DIAGNOSIS — M87052 Idiopathic aseptic necrosis of left femur: Secondary | ICD-10-CM | POA: Diagnosis not present

## 2024-01-06 NOTE — Progress Notes (Signed)
 DATE OF VISIT: 01/06/2024        David King DOB: [birth year] MRN: [id number]  CC:  f/u Lt MRI hip  History of present Illness: David King is a 68 y.o. male who presents for a follow-up visit for MRI hip.  Symptoms are unchanged from last visit.  No new complaints  Medications:  Outpatient Encounter Medications as of 01/06/2024  Medication Sig   albuterol  (VENTOLIN  HFA) 108 (90 Base) MCG/ACT inhaler USE 1-2 PUFFS EVERY 6 HOURS AS NEEDED FOR WHEEZING OR SHORTNESS OF BREATH.   aspirin  EC 81 MG tablet Take by mouth.   Coal Tar Extract [id number] PSORIASIS MEDICATED EX) Apply 1 Application topically 3 (three) times daily as needed (psoriasis).   diphenhydramine -acetaminophen  (TYLENOL  PM EXTRA STRENGTH) 25-500 MG TABS tablet Take 2 tablets by mouth at bedtime.   gabapentin  (NEURONTIN ) 300 MG capsule Take 600 mg by mouth 3 (three) times daily.   pravastatin  (PRAVACHOL ) 20 MG tablet Take 20 mg by mouth in the morning.   TRELEGY ELLIPTA  100-62.5-25 MCG/ACT AEPB USE 1 INHALATION BY MOUTH INTO  THE LUNGS ONCE DAILY AT THE SAME TIME EACH DAY   No facility-administered encounter medications on file as of 01/06/2024.    Allergies: has no known allergies.  Physical Examination: Vitals: BP 138/82   Ht 5' 9 (1.753 m)   Wt 206 lb (93.4 kg)   BMI 30.42 kg/m  GENERAL:  David King is a 68 y.o. male appearing their stated age, alert and oriented x 3, in no apparent distress.  MSK: Sitting comfortably on exam room table.  Walking with antalgic gait   Radiology: LT hip MRI w/o contrast 12/17/23 showing: IMPRESSION: 1. Mild-moderate osteoarthritis of the left hip. 2. Left superior labral degeneration with and anterosuperior labral tear. 3. Mild tendinosis and small partial-thickness tear of the left hamstring origin. 4. Small focal area of chronic avascular necrosis in the anterosuperior left femoral head.  MRI LT hip 04/24/20 IMPRESSION: 1. Small focus of chronic avascular necrosis in  the anterior-superior left femoral head. No flattening or contour change. 2. Mild axial thinning of articular cartilage in the left hip joint. 3. Lower lumbar spondylosis and degenerative disc disease. 4. Sigmoid colon diverticulosis. -Images personally reviewed by me today and reviewed with patient during visit.     CT 06/10/22 of pelvis w/ contrast showing:  - Personal review of images specifically for musculoskeletal abnormalities showed small foci of AVN b/l femoral heads without any signs of collapse.  Also had lumbar degenerative changes with signs of prior lumbar fusion at L5-S1 Other findings as noted by radiologist including: IMPRESSION: -There is no evidence of intestinal obstruction or pneumoperitoneum. Appendix is not dilated. Diverticulosis of colon without signs of focal diverticulitis. - There are few large left renal stones measuring up to 1.8 cm in diameter. There is mild wall thickening in the left renal pelvis which may suggest acute or chronic pyelonephritis. There is no significant hydronephrosis. Bilateral renal cysts are seen. - There is 3.8 cm infrarenal aortic aneurysm.   CT abd/pelvis w/ contrast 11/19/2022 IMPRESSION:  -Personal review of images specifically for musculoskeletal abnormalities showed increased AVN noted on left femoral head when compared to prior CT 06/11/22.  No signs of cortical collapse.  Also noted to have degenerative changes of the lumbar spine and prior fusion at L5-S1 Other findings as noted by radiologist including: IMPRESSION: 1. Postsurgical changes of anterior pelvic wall. No evidence of recurrent hernia. 2. A 1.6 cm partially obstructing stone  in the left renal pelvis with mild left pelviectasis. Additional nonobstructing left renal inferior pole calculi measure up to 15 mm. 3. Cirrhosis with small ascites. 4. Sigmoid diverticulosis. No bowel obstruction. Normal appendix. 5. Infrarenal aortic aneurysm measuring up to 3.4 cm in  diameter. Recommend follow-up ultrasound every 3 years Assessment & Plan Primary osteoarthritis of left hip Chronic worsening left hip pain x 10+ years, complicated by underlying DJD and radiculopathy status post lumbar surgery including lumbar fusion. -Most recent MRI showing advancement of osteoarthritis when compared to previous MRI and CT scans.  Suspect worsening underlying osteoarthritis is now a large contributor to his hip symptoms -He has had no improvement with prior hip injections x 2, which he thinks were intra-articular, as well as in the greater trochanteric bursal area -Radicular symptoms were greatly improved after lumbar surgery, but hip pain still persists which would be consistent with worsening osteoarthritis  Plan: -Reviewed MRI findings in detail.  Diagnosis and treatment discussed in detail.  He has exhausted extensive conservative therapies over the last several years.  At this time I think he would be a candidate for consideration of a hip replacement.  Reviewed this with him.  Placed referral for him to see Dr. Toribio Higashi with [facility].  We were able to get him an appointment scheduled while he was in the office, he is scheduled to see them Thursday, January 9 at 1:45 PM.  Patient was provided with her office information -Patient will follow-up with orthopedist as directed.  He can follow-up with us  on an as-needed basis Avascular necrosis of bone of hip, left (HCC) Chronic left hip pain x 10+ years complicated by underlying lumbar DJD and and radiculopathy status post lumbar surgery including lumbar fusion. -Prior imaging studies showed areas of AVN along the left hip.  Most recent MRI does not show significant advancement of AVN, but does have small focal area of chronic AVN.  This may be contributing to his ongoing hip pain, along with his worsening osteoarthritis.  Plan: -Reviewed MRI findings in detail.  As noted above, with associated  progressive osteoarthritis, as well as chronic AVN, he would be a candidate for consideration of a hip replacement.Placed referral for him to see Dr. Toribio Higashi with [facility] and discuss possible surgical options. -He will follow-up with me on an as-needed basis Patient expressed understanding & agreement with above.  Encounter Diagnoses  Name Primary?   Primary osteoarthritis of left hip Yes   Avascular necrosis of bone of hip, left (HCC)     No orders of the defined types were placed in this encounter.

## 2024-01-06 NOTE — Patient Instructions (Addendum)
 [facility] Dr. Weber Cooks Address: [address], [place], [place] [address] Phone: [phone] Thursday, January 9th @ 1:45p Arrival time is 1:30p

## 2024-01-07 ENCOUNTER — Encounter: Payer: Self-pay | Admitting: Family Medicine

## 2024-01-08 DIAGNOSIS — M25552 Pain in left hip: Secondary | ICD-10-CM | POA: Diagnosis not present

## 2024-01-30 DIAGNOSIS — M7062 Trochanteric bursitis, left hip: Secondary | ICD-10-CM | POA: Diagnosis not present

## 2024-02-02 ENCOUNTER — Encounter: Payer: Self-pay | Admitting: Acute Care

## 2024-02-09 DIAGNOSIS — H3342 Traction detachment of retina, left eye: Secondary | ICD-10-CM | POA: Diagnosis not present

## 2024-02-09 DIAGNOSIS — H35342 Macular cyst, hole, or pseudohole, left eye: Secondary | ICD-10-CM | POA: Diagnosis not present

## 2024-02-10 DIAGNOSIS — R040 Epistaxis: Secondary | ICD-10-CM | POA: Diagnosis not present

## 2024-02-10 DIAGNOSIS — J310 Chronic rhinitis: Secondary | ICD-10-CM | POA: Diagnosis not present

## 2024-02-10 DIAGNOSIS — R252 Cramp and spasm: Secondary | ICD-10-CM | POA: Diagnosis not present

## 2024-02-12 DIAGNOSIS — R040 Epistaxis: Secondary | ICD-10-CM | POA: Diagnosis not present

## 2024-02-17 ENCOUNTER — Other Ambulatory Visit: Payer: Self-pay | Admitting: *Deleted

## 2024-02-17 DIAGNOSIS — Z87891 Personal history of nicotine dependence: Secondary | ICD-10-CM

## 2024-02-17 DIAGNOSIS — Z122 Encounter for screening for malignant neoplasm of respiratory organs: Secondary | ICD-10-CM

## 2024-02-25 ENCOUNTER — Ambulatory Visit
Admission: RE | Admit: 2024-02-25 | Discharge: 2024-02-25 | Disposition: A | Discharge status: Home / Self Care | Payer: Medicare Other | Source: Ambulatory Visit | Attending: Acute Care | Admitting: Acute Care

## 2024-02-25 DIAGNOSIS — Z122 Encounter for screening for malignant neoplasm of respiratory organs: Secondary | ICD-10-CM

## 2024-02-25 DIAGNOSIS — Z87891 Personal history of nicotine dependence: Secondary | ICD-10-CM

## 2024-02-26 DIAGNOSIS — M7062 Trochanteric bursitis, left hip: Secondary | ICD-10-CM | POA: Diagnosis not present

## 2024-03-19 ENCOUNTER — Telehealth: Payer: Self-pay | Admitting: Acute Care

## 2024-03-19 NOTE — Telephone Encounter (Signed)
 CT from March 7th at [facility]

## 2024-03-22 NOTE — Telephone Encounter (Signed)
 Call Report From Tiffany  IMPRESSION: 1. 4.5 mm apical left upper lobe nodule may contain calcification but is new from 09/05/2020 and is categorized as Lung-RADS 3, probably benign findings. Short-term follow-up in 6 months is recommended with repeat low-dose chest CT without contrast (please use the following order, "CT CHEST LCS NODULE FOLLOW-UP W/O CM"). These results will be called to the ordering clinician or representative by the Radiologist Assistant, and communication documented in the PACS or [employer]. 2. Mild cylindrical bronchiectasis. 3. Liver margin appears minimally irregular, raising suspicion for cirrhosis. 4. Aortic atherosclerosis (ICD10-I70.0). Coronary artery calcification. 5.  Emphysema (ICD10-J43.9).

## 2024-03-23 NOTE — Telephone Encounter (Signed)
 Called and spoke with patient regarding lung screening CT results. New small lung nodule noted that was new compare to scan from 2021. I advised patient that this recommendation was to repeat the scan in 6 months. PT asked if his insurance would cover this. I explained that he may be responsible for any copays or deductibles since this is a follow up scan. Pt stated that this may be food in his lungs because he sometimes has trouble swallowing his food. He wants to wait to repeat scan in 1 year instead of 6 months. I explained the importance of having the recommended scan in 6 months. Pt stated again that he wanted to wait until the 1 year scan.  Sending note to Kandice Robinsons, NP as an Lorain Childes.

## 2024-03-24 ENCOUNTER — Other Ambulatory Visit: Payer: Self-pay

## 2024-03-24 DIAGNOSIS — Z87891 Personal history of nicotine dependence: Secondary | ICD-10-CM

## 2024-03-24 DIAGNOSIS — Z122 Encounter for screening for malignant neoplasm of respiratory organs: Secondary | ICD-10-CM

## 2024-03-25 NOTE — Telephone Encounter (Signed)
 Reviewed results with Kandice Robinsons NP. She would like [place] to reach out to the patient again to advise it is in the patient's best interest to have a 6 month follow up scan. If patient declines the 6 month follow up scan again we will need to inform patient that if by chance this is not benign then there is risk of spread if done in 1 year vs 6 months. Patient also had noted bronchiectasis and would like to suggest a pulm referral for follow up on this finding.

## 2024-03-25 NOTE — Telephone Encounter (Signed)
 Called and spoke with patient. Advised results have been reviewed by Alexandria Lodge, NP and she has advised it is in the patient's best interest to have a 6 month follow up scan. He advises that he understands the risk and verbalized on the last call he was not interested in a sooner than  an annual scan. Declines pulmonology referral at this time. Pt agrees to annual scan in 2026. Results sent to PCP with plan. One year order placed.

## 2024-04-13 DIAGNOSIS — H3342 Traction detachment of retina, left eye: Secondary | ICD-10-CM | POA: Diagnosis not present

## 2024-04-13 DIAGNOSIS — H35342 Macular cyst, hole, or pseudohole, left eye: Secondary | ICD-10-CM | POA: Diagnosis not present

## 2024-04-13 DIAGNOSIS — H35352 Cystoid macular degeneration, left eye: Secondary | ICD-10-CM | POA: Diagnosis not present

## 2024-04-16 DIAGNOSIS — M25559 Pain in unspecified hip: Secondary | ICD-10-CM | POA: Diagnosis not present

## 2024-04-19 DIAGNOSIS — J439 Emphysema, unspecified: Secondary | ICD-10-CM | POA: Diagnosis not present

## 2024-04-19 DIAGNOSIS — G629 Polyneuropathy, unspecified: Secondary | ICD-10-CM | POA: Diagnosis not present

## 2024-04-19 DIAGNOSIS — R0981 Nasal congestion: Secondary | ICD-10-CM | POA: Diagnosis not present

## 2024-04-19 DIAGNOSIS — Z0000 Encounter for general adult medical examination without abnormal findings: Secondary | ICD-10-CM | POA: Diagnosis not present

## 2024-04-19 DIAGNOSIS — E559 Vitamin D deficiency, unspecified: Secondary | ICD-10-CM | POA: Diagnosis not present

## 2024-04-19 DIAGNOSIS — Z79899 Other long term (current) drug therapy: Secondary | ICD-10-CM | POA: Diagnosis not present

## 2024-04-19 DIAGNOSIS — E7800 Pure hypercholesterolemia, unspecified: Secondary | ICD-10-CM | POA: Diagnosis not present

## 2024-05-05 DIAGNOSIS — I739 Peripheral vascular disease, unspecified: Secondary | ICD-10-CM | POA: Diagnosis not present

## 2024-05-05 DIAGNOSIS — Z48812 Encounter for surgical aftercare following surgery on the circulatory system: Secondary | ICD-10-CM | POA: Diagnosis not present

## 2024-06-01 ENCOUNTER — Ambulatory Visit: Payer: Medicare Other | Admitting: Podiatry

## 2024-06-01 ENCOUNTER — Encounter: Payer: Self-pay | Admitting: Podiatry

## 2024-06-01 DIAGNOSIS — M79674 Pain in right toe(s): Secondary | ICD-10-CM

## 2024-06-01 DIAGNOSIS — M79675 Pain in left toe(s): Secondary | ICD-10-CM | POA: Diagnosis not present

## 2024-06-01 DIAGNOSIS — B351 Tinea unguium: Secondary | ICD-10-CM | POA: Diagnosis not present

## 2024-06-01 DIAGNOSIS — I739 Peripheral vascular disease, unspecified: Secondary | ICD-10-CM | POA: Diagnosis not present

## 2024-06-03 DIAGNOSIS — M5432 Sciatica, left side: Secondary | ICD-10-CM | POA: Diagnosis not present

## 2024-06-04 DIAGNOSIS — M545 Low back pain, unspecified: Secondary | ICD-10-CM | POA: Diagnosis not present

## 2024-06-06 NOTE — Progress Notes (Signed)
  Subjective:  Patient ID: David King, male    DOB: 11/11/1956,  MRN: [id number]  68 y.o. male presents at risk foot care. Patient has h/o PAD and painful, elongated thickened toenails x 10 which are symptomatic when wearing enclosed shoe gear. This interferes with his/her daily activities. He is followed by Vascular Surgery for his PAD. Chief Complaint  Patient presents with   Nail Problem    "Cut my toenails."    New problem(s): None   PCP is Jimmey Mould, MD , and last visit was February 10, 2024.  No Known Allergies  Review of Systems: Negative except as noted in the HPI.   Objective:  [name] is a pleasant 68 y.o. male WD, WN in NAD. AAO x 3.  Vascular Examination: Vascular status intact b/l with palpable pedal pulses. CFT immediate b/l. Pedal hair present. No edema. No pain with calf compression b/l. Skin temperature gradient WNL b/l. No varicosities noted. No cyanosis or clubbing noted.  Neurological Examination: Sensation grossly intact b/l with 10 gram monofilament. Vibratory sensation intact b/l.  Dermatological Examination: Pedal skin with normal turgor, texture and tone b/l. No open wounds nor interdigital macerations noted. Toenails 1-5 b/l thick, discolored, elongated with subungual debris and pain on dorsal palpation. No hyperkeratotic lesions noted b/l.   Musculoskeletal Examination: Muscle strength 5/5 to b/l LE.  No pain, crepitus noted b/l. No gross pedal deformities. Patient ambulates independently without assistive aids.   Radiographs: None  Last A1c:       No data to display           Assessment:   1. Pain due to onychomycosis of toenails of both feet   2. PAD (peripheral artery disease) (HCC)     Plan:  Consent given for treatment. Patient examined. All patient's and/or POA's questions/concerns addressed on today's visit. Mycotic toenails 1-5 debrided in length and girth without incident. Continue soft, supportive shoe gear daily.  Report any pedal injuries to medical professional. Call office if there are any quesitons/concerns. -Patient/POA to call should there be question/concern in the interim.  Return in about 3 months (around 09/01/2024).  Luella Sager, DPM      [place] LOCATION: [address] [place] [address]                   Office [phone]   [facility] LOCATION: [address] [place], [place] [address] Office [phone]

## 2024-06-15 DIAGNOSIS — M1612 Unilateral primary osteoarthritis, left hip: Secondary | ICD-10-CM | POA: Diagnosis not present

## 2024-06-15 DIAGNOSIS — M5442 Lumbago with sciatica, left side: Secondary | ICD-10-CM | POA: Diagnosis not present

## 2024-06-16 DIAGNOSIS — M545 Low back pain, unspecified: Secondary | ICD-10-CM | POA: Diagnosis not present

## 2024-06-21 DIAGNOSIS — M545 Low back pain, unspecified: Secondary | ICD-10-CM | POA: Diagnosis not present

## 2024-06-26 DIAGNOSIS — M545 Low back pain, unspecified: Secondary | ICD-10-CM | POA: Diagnosis not present

## 2024-06-29 DIAGNOSIS — M25552 Pain in left hip: Secondary | ICD-10-CM | POA: Diagnosis not present

## 2024-07-06 DIAGNOSIS — M5442 Lumbago with sciatica, left side: Secondary | ICD-10-CM | POA: Diagnosis not present

## 2024-07-16 DIAGNOSIS — M5416 Radiculopathy, lumbar region: Secondary | ICD-10-CM | POA: Diagnosis not present

## 2024-07-20 DIAGNOSIS — H35352 Cystoid macular degeneration, left eye: Secondary | ICD-10-CM | POA: Diagnosis not present

## 2024-07-20 DIAGNOSIS — H35342 Macular cyst, hole, or pseudohole, left eye: Secondary | ICD-10-CM | POA: Diagnosis not present

## 2024-07-20 DIAGNOSIS — H3342 Traction detachment of retina, left eye: Secondary | ICD-10-CM | POA: Diagnosis not present

## 2024-08-11 ENCOUNTER — Other Ambulatory Visit: Payer: Self-pay | Admitting: Orthopedic Surgery

## 2024-09-03 NOTE — Pre-Procedure Instructions (Signed)
 Surgical Instructions   Your procedure is scheduled on September 08, 2024. Report to [facility] Main Entrance A at 6:30 A.M., then check in with the Admitting office. Any questions or running late day of surgery: call [phone]  Questions prior to your surgery date: call [phone], Monday-Friday, 8am-4pm. If you experience any cold or flu symptoms such as cough, fever, chills, shortness of breath, etc. between now and your scheduled surgery, please notify us  at the above number.     Remember:  Do not eat after midnight the night before your surgery  You may drink clear liquids until 5:30AM the morning of your surgery.   Clear liquids allowed are: Water , Non-Citrus Juices (without pulp), Carbonated Beverages, Clear Tea (no milk, honey, etc.), Black Coffee Only (NO MILK, CREAM OR POWDERED CREAMER of any kind), and Gatorade.  Patient Instructions  The night before surgery:  No food after midnight. ONLY clear liquids after midnight  The day of surgery (if you do NOT have diabetes):  Drink ONE (1) Pre-Surgery Clear Ensure by 5:30AM the morning of surgery. Drink in one sitting. Do not sip.  This drink was given to you during your hospital  pre-op appointment visit.  Nothing else to drink after completing the  Pre-Surgery Clear Ensure.         If you have questions, please contact your surgeon's office.     Take these medicines the morning of surgery with A SIP OF WATER :  gabapentin  (NEURONTIN ) ipratropium (ATROVENT) 0.03 % nasal spray  pravastatin  (PRAVACHOL )  TRELEGY ELLIPTA  100-62.5-25 MCG/ACT AEPB   May take these medicines IF NEEDED: albuterol  (VENTOLIN  HFA) 108 (90 Base) MCG/ACT inhaler - bring inhaler to hospital with you   One week prior to surgery, STOP taking any Aspirin (unless otherwise instructed by your surgeon) Aleve, Naproxen, Ibuprofen , Motrin , Advil , Goody's, BC's, all herbal medications, fish oil, and non-prescription vitamins.                     Do  NOT Smoke (Tobacco/Vaping) for 24 hours prior to your procedure.  If you use a CPAP at night, you may bring your mask/headgear for your overnight stay.   You will be asked to remove any contacts, glasses, piercing's, hearing aid's, dentures/partials prior to surgery. Please bring cases for these items if needed.    Patients discharged the day of surgery will not be allowed to drive home, and someone needs to stay with them for 24 hours.  SURGICAL WAITING ROOM VISITATION Patients may have no more than 2 support people in the waiting area - these visitors may rotate.   Pre-op nurse will coordinate an appropriate time for 1 ADULT support person, who may not rotate, to accompany patient in pre-op.  Children under the age of 49 must have an adult with them who is not the patient and must remain in the main waiting area with an adult.  If the patient needs to stay at the hospital during part of their recovery, the visitor guidelines for inpatient rooms apply.  Please refer to the [facility] website for the visitor guidelines for any additional information.   If you received a COVID test during your pre-op visit  it is requested that you wear a mask when out in public, stay away from anyone that may not be feeling well and notify your surgeon if you develop symptoms. If you have been in contact with anyone that has tested positive in the last 10 days please notify you  Careers adviser.      Pre-operative 5 CHG Bathing Instructions   You can play a key role in reducing the risk of infection after surgery. Your skin needs to be as free of germs as possible. You can reduce the number of germs on your skin by washing with CHG (chlorhexidine  gluconate) soap before surgery. CHG is an antiseptic soap that kills germs and continues to kill germs even after washing.   DO NOT use if you have an allergy  to chlorhexidine /CHG or antibacterial soaps. If your skin becomes reddened or irritated, stop using the CHG and  notify one of our RNs at [phone].   Please shower with the CHG soap starting 4 days before surgery using the following schedule:     Please keep in mind the following:  DO NOT shave, including legs and underarms, starting the day of your first shower.   You may shave your face at any point before/day of surgery.  Place clean sheets on your bed the day you start using CHG soap. Use a clean washcloth (not used since being washed) for each shower. DO NOT sleep with pets once you start using the CHG.   CHG Shower Instructions:  Wash your face and private area with normal soap. If you choose to wash your hair, wash first with your normal shampoo.  After you use shampoo/soap, rinse your hair and body thoroughly to remove shampoo/soap residue.  Turn the water  OFF and apply about 3 tablespoons (45 ml) of CHG soap to a CLEAN washcloth.  Apply CHG soap ONLY FROM YOUR NECK DOWN TO YOUR TOES (washing for 3-5 minutes)  DO NOT use CHG soap on face, private areas, open wounds, or sores.  Pay special attention to the area where your surgery is being performed.  If you are having back surgery, having someone wash your back for you may be helpful. Wait 2 minutes after CHG soap is applied, then you may rinse off the CHG soap.  Pat dry with a clean towel  Put on clean clothes/pajamas   If you choose to wear lotion, please use ONLY the CHG-compatible lotions that are listed below.  Additional instructions for the day of surgery: DO NOT APPLY any lotions, deodorants, cologne, or perfumes.   Do not bring valuables to the hospital. [facility] is not responsible for any belongings/valuables. Do not wear nail polish, gel polish, artificial nails, or any other type of covering on natural nails (fingers and toes) Do not wear jewelry or makeup Put on clean/comfortable clothes.  Please brush your teeth.  Ask your nurse before applying any prescription medications to the skin.     CHG Compatible  Lotions   Aveeno Moisturizing lotion  Cetaphil Moisturizing Cream  Cetaphil Moisturizing Lotion  Clairol Herbal Essence Moisturizing Lotion, Dry Skin  Clairol Herbal Essence Moisturizing Lotion, Extra Dry Skin  Clairol Herbal Essence Moisturizing Lotion, Normal Skin  Curel Age Defying Therapeutic Moisturizing Lotion with Alpha Hydroxy  Curel Extreme Care Body Lotion  Curel Soothing Hands Moisturizing Hand Lotion  Curel Therapeutic Moisturizing Cream, Fragrance-Free  Curel Therapeutic Moisturizing Lotion, Fragrance-Free  Curel Therapeutic Moisturizing Lotion, Original Formula  Eucerin Daily Replenishing Lotion  Eucerin Dry Skin Therapy Plus Alpha Hydroxy Crme  Eucerin Dry Skin Therapy Plus Alpha Hydroxy Lotion  Eucerin Original Crme  Eucerin Original Lotion  Eucerin Plus Crme Eucerin Plus Lotion  Eucerin TriLipid Replenishing Lotion  Keri Anti-Bacterial Hand Lotion  Keri Deep Conditioning Original Lotion Dry Skin Formula Softly Scented  Keri Deep  Conditioning Original Lotion, Fragrance Free Sensitive Skin Formula  Keri Lotion Fast Absorbing Fragrance Free Sensitive Skin Formula  Keri Lotion Fast Absorbing Softly Scented Dry Skin Formula  Keri Original Lotion  Keri Skin Renewal Lotion Keri Silky Smooth Lotion  Keri Silky Smooth Sensitive Skin Lotion  Nivea Body Creamy Conditioning Oil  Nivea Body Extra Enriched Lotion  Nivea Body Original Lotion  Nivea Body Sheer Moisturizing Lotion Nivea Crme  Nivea Skin Firming Lotion  NutraDerm 30 Skin Lotion  NutraDerm Skin Lotion  NutraDerm Therapeutic Skin Cream  NutraDerm Therapeutic Skin Lotion  ProShield Protective Hand Cream  Provon moisturizing lotion  Please read over the following fact sheets that you were given.

## 2024-09-06 ENCOUNTER — Encounter (HOSPITAL_COMMUNITY): Payer: Self-pay

## 2024-09-06 ENCOUNTER — Encounter (HOSPITAL_COMMUNITY)
Admission: RE | Admit: 2024-09-06 | Discharge: 2024-09-06 | Disposition: A | Discharge status: Home / Self Care | Source: Ambulatory Visit | Attending: Orthopedic Surgery | Admitting: Orthopedic Surgery

## 2024-09-06 VITALS — BP 148/84 | HR 60 | Temp 98.1°F | Resp 18 | Ht 69.0 in | Wt 199.1 lb

## 2024-09-06 DIAGNOSIS — G43909 Migraine, unspecified, not intractable, without status migrainosus: Secondary | ICD-10-CM | POA: Insufficient documentation

## 2024-09-06 DIAGNOSIS — D696 Thrombocytopenia, unspecified: Secondary | ICD-10-CM | POA: Diagnosis not present

## 2024-09-06 DIAGNOSIS — T361X5A Adverse effect of cephalosporins and other beta-lactam antibiotics, initial encounter: Secondary | ICD-10-CM | POA: Diagnosis not present

## 2024-09-06 DIAGNOSIS — Z9842 Cataract extraction status, left eye: Secondary | ICD-10-CM | POA: Diagnosis not present

## 2024-09-06 DIAGNOSIS — I739 Peripheral vascular disease, unspecified: Secondary | ICD-10-CM | POA: Diagnosis not present

## 2024-09-06 DIAGNOSIS — J95851 Ventilator associated pneumonia: Secondary | ICD-10-CM | POA: Diagnosis not present

## 2024-09-06 DIAGNOSIS — Z01818 Encounter for other preprocedural examination: Secondary | ICD-10-CM | POA: Insufficient documentation

## 2024-09-06 DIAGNOSIS — Z87891 Personal history of nicotine dependence: Secondary | ICD-10-CM | POA: Insufficient documentation

## 2024-09-06 DIAGNOSIS — I10 Essential (primary) hypertension: Secondary | ICD-10-CM | POA: Insufficient documentation

## 2024-09-06 DIAGNOSIS — M48061 Spinal stenosis, lumbar region without neurogenic claudication: Secondary | ICD-10-CM | POA: Diagnosis not present

## 2024-09-06 DIAGNOSIS — E785 Hyperlipidemia, unspecified: Secondary | ICD-10-CM | POA: Diagnosis not present

## 2024-09-06 DIAGNOSIS — I2510 Atherosclerotic heart disease of native coronary artery without angina pectoris: Secondary | ICD-10-CM | POA: Diagnosis not present

## 2024-09-06 DIAGNOSIS — F1090 Alcohol use, unspecified, uncomplicated: Secondary | ICD-10-CM | POA: Insufficient documentation

## 2024-09-06 DIAGNOSIS — M5116 Intervertebral disc disorders with radiculopathy, lumbar region: Secondary | ICD-10-CM | POA: Diagnosis not present

## 2024-09-06 DIAGNOSIS — M21371 Foot drop, right foot: Secondary | ICD-10-CM | POA: Diagnosis not present

## 2024-09-06 DIAGNOSIS — Z781 Physical restraint status: Secondary | ICD-10-CM | POA: Diagnosis not present

## 2024-09-06 DIAGNOSIS — J449 Chronic obstructive pulmonary disease, unspecified: Secondary | ICD-10-CM | POA: Diagnosis not present

## 2024-09-06 DIAGNOSIS — T782XXA Anaphylactic shock, unspecified, initial encounter: Secondary | ICD-10-CM | POA: Diagnosis not present

## 2024-09-06 DIAGNOSIS — K5900 Constipation, unspecified: Secondary | ICD-10-CM | POA: Diagnosis not present

## 2024-09-06 DIAGNOSIS — M5106 Intervertebral disc disorders with myelopathy, lumbar region: Secondary | ICD-10-CM | POA: Diagnosis not present

## 2024-09-06 DIAGNOSIS — Z881 Allergy status to other antibiotic agents status: Secondary | ICD-10-CM | POA: Diagnosis not present

## 2024-09-06 DIAGNOSIS — Z961 Presence of intraocular lens: Secondary | ICD-10-CM | POA: Diagnosis not present

## 2024-09-06 DIAGNOSIS — Z79899 Other long term (current) drug therapy: Secondary | ICD-10-CM | POA: Diagnosis not present

## 2024-09-06 HISTORY — DX: Unspecified atherosclerosis: I70.90

## 2024-09-06 HISTORY — DX: Peripheral vascular disease, unspecified: I73.9

## 2024-09-06 LAB — COMPREHENSIVE METABOLIC PANEL WITH GFR
ALT: 44 U/L (ref 0–44)
AST: 27 U/L (ref 15–41)
Albumin: 3.8 g/dL (ref 3.5–5.0)
Alkaline Phosphatase: 45 U/L (ref 38–126)
Anion gap: 8 (ref 5–15)
BUN: 14 mg/dL (ref 8–23)
CO2: 27 mmol/L (ref 22–32)
Calcium: 9.3 mg/dL (ref 8.9–10.3)
Chloride: 103 mmol/L (ref 98–111)
Creatinine, Ser: 1.18 mg/dL (ref 0.61–1.24)
GFR, Estimated: >60 mL/min (ref >60)
Glucose, Bld: 97 mg/dL (ref 70–99)
Potassium: 4.3 mmol/L (ref 3.5–5.1)
Sodium: 138 mmol/L (ref 135–145)
Total Bilirubin: 0.8 mg/dL (ref 0.0–1.2)
Total Protein: 7 g/dL (ref 6.5–8.1)

## 2024-09-06 LAB — TYPE AND SCREEN
ABO/RH(D): O POS
Antibody Screen: NEGATIVE

## 2024-09-06 LAB — CBC
HCT: 45.4 % (ref 39.0–52.0)
Hemoglobin: 14.9 g/dL (ref 13.0–17.0)
MCH: 33 pg (ref 26.0–34.0)
MCHC: 32.8 g/dL (ref 30.0–36.0)
MCV: 100.4 fL — ABNORMAL HIGH (ref 80.0–100.0)
Platelets: 165 K/uL (ref 150–400)
RBC: 4.52 MIL/uL (ref 4.22–5.81)
RDW: 13.9 % (ref 11.5–15.5)
WBC: 6 K/uL (ref 4.0–10.5)
nRBC: 0 % (ref 0.0–0.2)

## 2024-09-06 LAB — SURGICAL PCR SCREEN
MRSA, PCR: NEGATIVE
Staphylococcus aureus: NEGATIVE

## 2024-09-06 NOTE — Progress Notes (Signed)
 PCP - Dr. Carlin Gull Cardiologist - denies   Chest x-ray -  EKG - 09/06/24 Stress Test -  ECHO -  Cardiac Cath - 03/10/2018   Sleep Study - denies CPAP -   Fasting Blood Sugar - n/a Checks Blood Sugar _____ times a day  Last dose of GLP1 agonist-  n/a GLP1 instructions:   Blood Thinner Instructions: n/a Aspirin Instructions:  ERAS Protcol - Clears until 5:30 PRE-SURGERY Ensure or G2- Ensure ordered, however, pt does not like this Ensure and will not drink it. Encouraged to drink water  instead, pt agreeable.  COVID TEST- n/a   Anesthesia review: Yes, PAD, Vascular history  Patient denies shortness of breath, fever, cough and chest pain at PAT appointment

## 2024-09-07 NOTE — Anesthesia Preprocedure Evaluation (Signed)
 Anesthesia Evaluation  Patient identified by MRN, date of birth, ID band Patient awake    Reviewed: Allergy  & Precautions, NPO status , Patient's Chart, lab work & pertinent test results  Airway Mallampati: II  TM Distance: >3 FB Neck ROM: Full    Dental no notable dental hx.    Pulmonary COPD,  COPD inhaler, former smoker   Pulmonary exam normal        Cardiovascular hypertension, Pt. on medications + CAD and + Peripheral Vascular Disease   Rhythm:Regular Rate:Normal     Neuro/Psych  Headaches  Anxiety        GI/Hepatic Neg liver ROS,GERD  Medicated,,  Endo/Other  negative endocrine ROS    Renal/GU   negative genitourinary   Musculoskeletal  (+) Arthritis , Osteoarthritis,    Abdominal Normal abdominal exam  (+)   Peds  Hematology negative hematology ROS (+)   Anesthesia Other Findings   Reproductive/Obstetrics                              Anesthesia Physical Anesthesia Plan  ASA: 3  Anesthesia Plan: General   Post-op Pain Management:    Induction: Intravenous  PONV Risk Score and Plan: 2 and Ondansetron , Midazolam  and Treatment may vary due to age or medical condition  Airway Management Planned: Oral ETT  Additional Equipment: None  Intra-op Plan:   Post-operative Plan: Extubation in OR  Informed Consent: I have reviewed the patients History and Physical, chart, labs and discussed the procedure including the risks, benefits and alternatives for the proposed anesthesia with the patient or authorized representative who has indicated his/her understanding and acceptance.     Dental advisory given  Plan Discussed with: CRNA  Anesthesia Plan Comments: (PAT note by Lynwood Hope, PA-C: 68 year old male follows with vascular surgery at [facility] for history of PAD s/p left common femoral endarterectomy and bilateral common iliac artery stents and a left external iliac artery  stent on 02/26/2022.  Last seen by Dr. Aleene Ada on 05/07/2023 for follow-up.  Per note, he was stable at that time, recommended follow-up on a yearly basis.  Follow-up Doppler studies 05/05/24 showed no evidence of stenosis in the RLE, increased velocities in the left proximal CFA 259 cm/s (prior Doppler studies 05/07/2023 showed velocities 316 cm/s).  Other pertinent history includes former smoker (94 pack years, quit 2018) with associated moderate COPD, migraines.  Cardiac cath 02/2018 showed mild nonobstructive disease.  Preop labs reviewed, unremarkable.  EKG 09/06/2024: Sinus bradycardia.  Rate 59.  Cardiac cath 03/10/2018 (Care Everywhere):  High left ventricle end diastolic pressure.   Non-obstructive coronary artery disease.   Maximize medical therapy.   Discussed with patient (but may still be drowsy).   Discussed with family:   LCP for CCS3 angina   L dom, large LCX territory. Mild non obs CAD. LAD is a small vessel - no  obstructive disease   RCA small, ND.   LVEDP elevated.   )         Anesthesia Quick Evaluation

## 2024-09-07 NOTE — Progress Notes (Signed)
 Anesthesia Chart Review:  68 year old male follows with vascular surgery at [facility] for history of PAD s/p left common femoral endarterectomy and bilateral common iliac artery stents and a left external iliac artery stent on 02/26/2022.  Last seen by Dr. Aleene Ada on 05/07/2023 for follow-up.  Per note, he was stable at that time, recommended follow-up on a yearly basis.  Follow-up Doppler studies 05/05/24 showed no evidence of stenosis in the RLE, increased velocities in the left proximal CFA 259 cm/s (prior Doppler studies 05/07/2023 showed velocities 316 cm/s).   Other pertinent history includes former smoker (94 pack years, quit 2018) with associated moderate COPD, migraines.  Cardiac cath 02/2018 showed mild nonobstructive disease.   Preop labs reviewed, unremarkable.   EKG 09/06/2024: Sinus bradycardia.  Rate 59.   Cardiac cath 03/10/2018 (Care Everywhere):  High left ventricle end diastolic pressure.   Non-obstructive coronary artery disease.   Maximize medical therapy.   Discussed with patient (but may still be drowsy).   Discussed with family:   LCP for CCS3 angina   L dom, large LCX territory. Mild non obs CAD. LAD is a small vessel - no  obstructive disease   RCA small, ND.   LVEDP elevated.       David King [facility] Short Stay [facility]/Anesthesiology Phone [phone] 09/07/2024 10:49 AM

## 2024-09-08 ENCOUNTER — Other Ambulatory Visit: Payer: Self-pay

## 2024-09-08 ENCOUNTER — Ambulatory Visit (HOSPITAL_COMMUNITY): Admitting: Certified Registered Nurse Anesthetist

## 2024-09-08 ENCOUNTER — Encounter (HOSPITAL_COMMUNITY): Payer: Self-pay | Admitting: Orthopedic Surgery

## 2024-09-08 ENCOUNTER — Inpatient Hospital Stay (HOSPITAL_COMMUNITY)
Admission: AD | Admit: 2024-09-08 | Discharge: 2024-09-15 | DRG: 402 | Disposition: A | Discharge status: Rehabilitation Facility | Attending: Orthopedic Surgery | Admitting: Orthopedic Surgery

## 2024-09-08 ENCOUNTER — Inpatient Hospital Stay (HOSPITAL_COMMUNITY)

## 2024-09-08 ENCOUNTER — Ambulatory Visit (HOSPITAL_COMMUNITY)

## 2024-09-08 ENCOUNTER — Ambulatory Visit: Admitting: Podiatry

## 2024-09-08 ENCOUNTER — Encounter (HOSPITAL_COMMUNITY)
Admission: AD | Disposition: A | Discharge status: Still In Hospital | Payer: Self-pay | Source: Home / Self Care | Attending: Orthopedic Surgery

## 2024-09-08 ENCOUNTER — Ambulatory Visit (HOSPITAL_COMMUNITY): Admitting: Physician Assistant

## 2024-09-08 DIAGNOSIS — Z6830 Body mass index (BMI) 30.0-30.9, adult: Secondary | ICD-10-CM

## 2024-09-08 DIAGNOSIS — Z9842 Cataract extraction status, left eye: Secondary | ICD-10-CM

## 2024-09-08 DIAGNOSIS — Z881 Allergy status to other antibiotic agents status: Secondary | ICD-10-CM | POA: Diagnosis not present

## 2024-09-08 DIAGNOSIS — M4854XA Collapsed vertebra, not elsewhere classified, thoracic region, initial encounter for fracture: Secondary | ICD-10-CM | POA: Diagnosis not present

## 2024-09-08 DIAGNOSIS — M21371 Foot drop, right foot: Secondary | ICD-10-CM | POA: Diagnosis present

## 2024-09-08 DIAGNOSIS — Z8042 Family history of malignant neoplasm of prostate: Secondary | ICD-10-CM

## 2024-09-08 DIAGNOSIS — Z8546 Personal history of malignant neoplasm of prostate: Secondary | ICD-10-CM

## 2024-09-08 DIAGNOSIS — M5116 Intervertebral disc disorders with radiculopathy, lumbar region: Secondary | ICD-10-CM | POA: Diagnosis present

## 2024-09-08 DIAGNOSIS — S3421XA Injury of nerve root of lumbar spine, initial encounter: Secondary | ICD-10-CM | POA: Diagnosis not present

## 2024-09-08 DIAGNOSIS — I70221 Atherosclerosis of native arteries of extremities with rest pain, right leg: Secondary | ICD-10-CM | POA: Diagnosis not present

## 2024-09-08 DIAGNOSIS — R208 Other disturbances of skin sensation: Secondary | ICD-10-CM | POA: Diagnosis not present

## 2024-09-08 DIAGNOSIS — M79A21 Nontraumatic compartment syndrome of right lower extremity: Secondary | ICD-10-CM | POA: Diagnosis not present

## 2024-09-08 DIAGNOSIS — Z87892 Personal history of anaphylaxis: Secondary | ICD-10-CM

## 2024-09-08 DIAGNOSIS — E66811 Obesity, class 1: Secondary | ICD-10-CM | POA: Diagnosis present

## 2024-09-08 DIAGNOSIS — M47816 Spondylosis without myelopathy or radiculopathy, lumbar region: Secondary | ICD-10-CM | POA: Diagnosis not present

## 2024-09-08 DIAGNOSIS — Z9889 Other specified postprocedural states: Secondary | ICD-10-CM | POA: Diagnosis not present

## 2024-09-08 DIAGNOSIS — I639 Cerebral infarction, unspecified: Secondary | ICD-10-CM | POA: Diagnosis not present

## 2024-09-08 DIAGNOSIS — Y848 Other medical procedures as the cause of abnormal reaction of the patient, or of later complication, without mention of misadventure at the time of the procedure: Secondary | ICD-10-CM | POA: Diagnosis not present

## 2024-09-08 DIAGNOSIS — R918 Other nonspecific abnormal finding of lung field: Secondary | ICD-10-CM | POA: Diagnosis not present

## 2024-09-08 DIAGNOSIS — Z981 Arthrodesis status: Secondary | ICD-10-CM | POA: Diagnosis not present

## 2024-09-08 DIAGNOSIS — T361X5A Adverse effect of cephalosporins and other beta-lactam antibiotics, initial encounter: Secondary | ICD-10-CM | POA: Diagnosis not present

## 2024-09-08 DIAGNOSIS — M79661 Pain in right lower leg: Secondary | ICD-10-CM | POA: Diagnosis not present

## 2024-09-08 DIAGNOSIS — J449 Chronic obstructive pulmonary disease, unspecified: Secondary | ICD-10-CM | POA: Diagnosis not present

## 2024-09-08 DIAGNOSIS — I998 Other disorder of circulatory system: Secondary | ICD-10-CM | POA: Diagnosis not present

## 2024-09-08 DIAGNOSIS — G061 Intraspinal abscess and granuloma: Secondary | ICD-10-CM | POA: Diagnosis not present

## 2024-09-08 DIAGNOSIS — M5136 Other intervertebral disc degeneration, lumbar region with discogenic back pain only: Secondary | ICD-10-CM | POA: Diagnosis not present

## 2024-09-08 DIAGNOSIS — I7409 Other arterial embolism and thrombosis of abdominal aorta: Secondary | ICD-10-CM | POA: Diagnosis not present

## 2024-09-08 DIAGNOSIS — R200 Anesthesia of skin: Secondary | ICD-10-CM | POA: Diagnosis not present

## 2024-09-08 DIAGNOSIS — M4807 Spinal stenosis, lumbosacral region: Secondary | ICD-10-CM | POA: Diagnosis not present

## 2024-09-08 DIAGNOSIS — I10 Essential (primary) hypertension: Secondary | ICD-10-CM | POA: Diagnosis present

## 2024-09-08 DIAGNOSIS — Z961 Presence of intraocular lens: Secondary | ICD-10-CM | POA: Diagnosis present

## 2024-09-08 DIAGNOSIS — R7989 Other specified abnormal findings of blood chemistry: Secondary | ICD-10-CM | POA: Diagnosis not present

## 2024-09-08 DIAGNOSIS — M47814 Spondylosis without myelopathy or radiculopathy, thoracic region: Secondary | ICD-10-CM | POA: Diagnosis not present

## 2024-09-08 DIAGNOSIS — M5416 Radiculopathy, lumbar region: Secondary | ICD-10-CM | POA: Diagnosis not present

## 2024-09-08 DIAGNOSIS — R531 Weakness: Secondary | ICD-10-CM | POA: Diagnosis not present

## 2024-09-08 DIAGNOSIS — Z79899 Other long term (current) drug therapy: Secondary | ICD-10-CM

## 2024-09-08 DIAGNOSIS — Z9079 Acquired absence of other genital organ(s): Secondary | ICD-10-CM | POA: Diagnosis not present

## 2024-09-08 DIAGNOSIS — I739 Peripheral vascular disease, unspecified: Secondary | ICD-10-CM | POA: Diagnosis not present

## 2024-09-08 DIAGNOSIS — M48061 Spinal stenosis, lumbar region without neurogenic claudication: Secondary | ICD-10-CM | POA: Diagnosis present

## 2024-09-08 DIAGNOSIS — I2510 Atherosclerotic heart disease of native coronary artery without angina pectoris: Secondary | ICD-10-CM | POA: Diagnosis not present

## 2024-09-08 DIAGNOSIS — E785 Hyperlipidemia, unspecified: Secondary | ICD-10-CM | POA: Diagnosis not present

## 2024-09-08 DIAGNOSIS — R29898 Other symptoms and signs involving the musculoskeletal system: Secondary | ICD-10-CM | POA: Diagnosis not present

## 2024-09-08 DIAGNOSIS — M25579 Pain in unspecified ankle and joints of unspecified foot: Secondary | ICD-10-CM | POA: Diagnosis not present

## 2024-09-08 DIAGNOSIS — K5900 Constipation, unspecified: Secondary | ICD-10-CM | POA: Diagnosis present

## 2024-09-08 DIAGNOSIS — M7989 Other specified soft tissue disorders: Secondary | ICD-10-CM | POA: Diagnosis not present

## 2024-09-08 DIAGNOSIS — D696 Thrombocytopenia, unspecified: Secondary | ICD-10-CM | POA: Diagnosis not present

## 2024-09-08 DIAGNOSIS — I714 Abdominal aortic aneurysm, without rupture, unspecified: Secondary | ICD-10-CM | POA: Diagnosis not present

## 2024-09-08 DIAGNOSIS — L89616 Pressure-induced deep tissue damage of right heel: Secondary | ICD-10-CM | POA: Diagnosis not present

## 2024-09-08 DIAGNOSIS — M79A29 Nontraumatic compartment syndrome of unspecified lower extremity: Secondary | ICD-10-CM | POA: Diagnosis not present

## 2024-09-08 DIAGNOSIS — I9789 Other postprocedural complications and disorders of the circulatory system, not elsewhere classified: Secondary | ICD-10-CM | POA: Diagnosis not present

## 2024-09-08 DIAGNOSIS — I70201 Unspecified atherosclerosis of native arteries of extremities, right leg: Secondary | ICD-10-CM | POA: Diagnosis not present

## 2024-09-08 DIAGNOSIS — D72829 Elevated white blood cell count, unspecified: Secondary | ICD-10-CM | POA: Diagnosis not present

## 2024-09-08 DIAGNOSIS — Z781 Physical restraint status: Secondary | ICD-10-CM

## 2024-09-08 DIAGNOSIS — J95851 Ventilator associated pneumonia: Secondary | ICD-10-CM | POA: Diagnosis present

## 2024-09-08 DIAGNOSIS — R0689 Other abnormalities of breathing: Secondary | ICD-10-CM | POA: Diagnosis not present

## 2024-09-08 DIAGNOSIS — M5106 Intervertebral disc disorders with myelopathy, lumbar region: Principal | ICD-10-CM | POA: Diagnosis present

## 2024-09-08 DIAGNOSIS — Z87891 Personal history of nicotine dependence: Secondary | ICD-10-CM

## 2024-09-08 DIAGNOSIS — M4804 Spinal stenosis, thoracic region: Secondary | ICD-10-CM | POA: Diagnosis not present

## 2024-09-08 DIAGNOSIS — Z95820 Peripheral vascular angioplasty status with implants and grafts: Secondary | ICD-10-CM | POA: Diagnosis not present

## 2024-09-08 DIAGNOSIS — M79604 Pain in right leg: Secondary | ICD-10-CM | POA: Diagnosis not present

## 2024-09-08 DIAGNOSIS — R0989 Other specified symptoms and signs involving the circulatory and respiratory systems: Secondary | ICD-10-CM | POA: Diagnosis not present

## 2024-09-08 DIAGNOSIS — E871 Hypo-osmolality and hyponatremia: Secondary | ICD-10-CM | POA: Diagnosis not present

## 2024-09-08 DIAGNOSIS — T782XXA Anaphylactic shock, unspecified, initial encounter: Secondary | ICD-10-CM | POA: Diagnosis not present

## 2024-09-08 DIAGNOSIS — S81801A Unspecified open wound, right lower leg, initial encounter: Secondary | ICD-10-CM | POA: Diagnosis not present

## 2024-09-08 DIAGNOSIS — Z4682 Encounter for fitting and adjustment of non-vascular catheter: Secondary | ICD-10-CM | POA: Diagnosis not present

## 2024-09-08 DIAGNOSIS — I743 Embolism and thrombosis of arteries of the lower extremities: Secondary | ICD-10-CM | POA: Diagnosis not present

## 2024-09-08 DIAGNOSIS — R29890 Loss of height: Secondary | ICD-10-CM | POA: Diagnosis not present

## 2024-09-08 DIAGNOSIS — Z95828 Presence of other vascular implants and grafts: Secondary | ICD-10-CM | POA: Diagnosis not present

## 2024-09-08 DIAGNOSIS — T819XXA Unspecified complication of procedure, initial encounter: Secondary | ICD-10-CM | POA: Diagnosis not present

## 2024-09-08 HISTORY — PX: TRANSFORAMINAL LUMBAR INTERBODY FUSION (TLIF) WITH PEDICLE SCREW FIXATION 1 LEVEL: SHX6141

## 2024-09-08 LAB — POCT I-STAT 7, (LYTES, BLD GAS, ICA,H+H)
Acid-base deficit: 3 mmol/L — ABNORMAL HIGH (ref 0.0–2.0)
Bicarbonate: 22.9 mmol/L (ref 20.0–28.0)
Calcium, Ion: 1.18 mmol/L (ref 1.15–1.40)
HCT: 40 % (ref 39.0–52.0)
Hemoglobin: 13.6 g/dL (ref 13.0–17.0)
O2 Saturation: 97 %
Patient temperature: 94.1
Potassium: 4.1 mmol/L (ref 3.5–5.1)
Sodium: 138 mmol/L (ref 135–145)
TCO2: 24 mmol/L (ref 22–32)
pCO2 arterial: 40.2 mmHg (ref 32–48)
pH, Arterial: 7.351 (ref 7.35–7.45)
pO2, Arterial: 89 mmHg (ref 83–108)

## 2024-09-08 LAB — COMPREHENSIVE METABOLIC PANEL WITH GFR
ALT: 34 U/L (ref 0–44)
AST: 28 U/L (ref 15–41)
Albumin: 3.2 g/dL — ABNORMAL LOW (ref 3.5–5.0)
Alkaline Phosphatase: 32 U/L — ABNORMAL LOW (ref 38–126)
Anion gap: 8 (ref 5–15)
BUN: 13 mg/dL (ref 8–23)
CO2: 22 mmol/L (ref 22–32)
Calcium: 8.3 mg/dL — ABNORMAL LOW (ref 8.9–10.3)
Chloride: 106 mmol/L (ref 98–111)
Creatinine, Ser: 1.03 mg/dL (ref 0.61–1.24)
GFR, Estimated: >60 mL/min (ref >60)
Glucose, Bld: 161 mg/dL — ABNORMAL HIGH (ref 70–99)
Potassium: 4 mmol/L (ref 3.5–5.1)
Sodium: 136 mmol/L (ref 135–145)
Total Bilirubin: 0.7 mg/dL (ref 0.0–1.2)
Total Protein: 5.5 g/dL — ABNORMAL LOW (ref 6.5–8.1)

## 2024-09-08 LAB — MRSA NEXT GEN BY PCR, NASAL: MRSA by PCR Next Gen: NOT DETECTED

## 2024-09-08 LAB — GLUCOSE, CAPILLARY
Glucose-Capillary: 142 mg/dL — ABNORMAL HIGH (ref 70–99)
Glucose-Capillary: 157 mg/dL — ABNORMAL HIGH (ref 70–99)
Glucose-Capillary: 171 mg/dL — ABNORMAL HIGH (ref 70–99)
Glucose-Capillary: 194 mg/dL — ABNORMAL HIGH (ref 70–99)

## 2024-09-08 LAB — CBC
HCT: 40.7 % (ref 39.0–52.0)
Hemoglobin: 13.8 g/dL (ref 13.0–17.0)
MCH: 33 pg (ref 26.0–34.0)
MCHC: 33.9 g/dL (ref 30.0–36.0)
MCV: 97.4 fL (ref 80.0–100.0)
Platelets: 155 K/uL (ref 150–400)
RBC: 4.18 MIL/uL — ABNORMAL LOW (ref 4.22–5.81)
RDW: 13.7 % (ref 11.5–15.5)
WBC: 13 K/uL — ABNORMAL HIGH (ref 4.0–10.5)
nRBC: 0 % (ref 0.0–0.2)

## 2024-09-08 SURGERY — TRANSFORAMINAL LUMBAR INTERBODY FUSION (TLIF) WITH PEDICLE SCREW FIXATION 1 LEVEL
Anesthesia: General | Site: Spine Lumbar | Laterality: Left

## 2024-09-08 MED ORDER — DOCUSATE SODIUM 50 MG/5ML PO LIQD
100.0000 mg | Freq: Two times a day (BID) | ORAL | Status: DC
  Administered 2024-09-09: 100 mg
  Filled 2024-09-08: qty 10

## 2024-09-08 MED ORDER — BUPIVACAINE LIPOSOME 1.3 % IJ SUSP
INTRAMUSCULAR | Status: AC
  Filled 2024-09-08: qty 20

## 2024-09-08 MED ORDER — MIDAZOLAM HCL 2 MG/2ML IJ SOLN
INTRAMUSCULAR | Status: DC | PRN
  Administered 2024-09-08: 1 mg via INTRAVENOUS

## 2024-09-08 MED ORDER — CHLORHEXIDINE GLUCONATE 0.12 % MT SOLN
15.0000 mL | Freq: Once | OROMUCOSAL | Status: AC
  Administered 2024-09-08: 15 mL via OROMUCOSAL
  Filled 2024-09-08: qty 15

## 2024-09-08 MED ORDER — METHOCARBAMOL 500 MG PO TABS
500.0000 mg | ORAL_TABLET | Freq: Four times a day (QID) | ORAL | Status: DC | PRN
  Administered 2024-09-10: 1000 mg via ORAL
  Administered 2024-09-12: 500 mg via ORAL
  Administered 2024-09-12 (×2): 1000 mg via ORAL
  Administered 2024-09-13: 500 mg via ORAL
  Administered 2024-09-13 (×2): 1000 mg via ORAL
  Filled 2024-09-08 (×4): qty 2
  Filled 2024-09-08: qty 1
  Filled 2024-09-08: qty 2
  Filled 2024-09-08: qty 1

## 2024-09-08 MED ORDER — METHOCARBAMOL 1000 MG/10ML IJ SOLN: 500.0000 mg | Freq: Four times a day (QID) | INTRAMUSCULAR | Status: DC | PRN

## 2024-09-08 MED ORDER — LACTATED RINGERS IV BOLUS
1000.0000 mL | Freq: Once | INTRAVENOUS | Status: AC
  Administered 2024-09-08: 1000 mL via INTRAVENOUS

## 2024-09-08 MED ORDER — DIPHENHYDRAMINE HCL 50 MG/ML IJ SOLN
INTRAMUSCULAR | Status: AC
  Filled 2024-09-08: qty 1

## 2024-09-08 MED ORDER — FENTANYL CITRATE PF 50 MCG/ML IJ SOSY
50.0000 ug | PREFILLED_SYRINGE | INTRAMUSCULAR | Status: AC | PRN
  Administered 2024-09-09 (×3): 50 ug via INTRAVENOUS
  Filled 2024-09-08: qty 1

## 2024-09-08 MED ORDER — DOCUSATE SODIUM 100 MG PO CAPS: 100.0000 mg | ORAL_CAPSULE | Freq: Two times a day (BID) | ORAL | Status: DC

## 2024-09-08 MED ORDER — SENNOSIDES-DOCUSATE SODIUM 8.6-50 MG PO TABS: 1.0000 | ORAL_TABLET | Freq: Every evening | ORAL | Status: DC | PRN

## 2024-09-08 MED ORDER — DEXAMETHASONE SODIUM PHOSPHATE 10 MG/ML IJ SOLN
INTRAMUSCULAR | Status: AC
  Filled 2024-09-08: qty 1

## 2024-09-08 MED ORDER — ORAL CARE MOUTH RINSE: 15.0000 mL | OROMUCOSAL | Status: DC | PRN

## 2024-09-08 MED ORDER — INSULIN ASPART 100 UNIT/ML IJ SOLN
0.0000 [IU] | INTRAMUSCULAR | Status: DC
  Administered 2024-09-08: 1 [IU] via SUBCUTANEOUS
  Administered 2024-09-08: 2 [IU] via SUBCUTANEOUS
  Administered 2024-09-09 – 2024-09-11 (×4): 1 [IU] via SUBCUTANEOUS

## 2024-09-08 MED ORDER — VANCOMYCIN HCL IN DEXTROSE 1-5 GM/200ML-% IV SOLN
1000.0000 mg | Freq: Once | INTRAVENOUS | Status: AC
Start: 2024-09-08 — End: 2024-09-08
  Administered 2024-09-08: 1000 mg via INTRAVENOUS
  Filled 2024-09-08: qty 200

## 2024-09-08 MED ORDER — LACTATED RINGERS IV SOLN: INTRAVENOUS | Status: AC

## 2024-09-08 MED ORDER — POLYETHYLENE GLYCOL 3350 17 G PO PACK: 17.0000 g | PACK | Freq: Every day | ORAL | Status: DC

## 2024-09-08 MED ORDER — EPHEDRINE 5 MG/ML INJ
INTRAVENOUS | Status: AC
  Filled 2024-09-08: qty 5

## 2024-09-08 MED ORDER — ZOLPIDEM TARTRATE 5 MG PO TABS
5.0000 mg | ORAL_TABLET | Freq: Every evening | ORAL | Status: DC | PRN
  Administered 2024-09-10 – 2024-09-14 (×3): 5 mg via ORAL
  Filled 2024-09-08 (×3): qty 1

## 2024-09-08 MED ORDER — LACTATED RINGERS IV SOLN: INTRAVENOUS | Status: DC | PRN

## 2024-09-08 MED ORDER — DIPHENHYDRAMINE HCL 50 MG/ML IJ SOLN
50.0000 mg | Freq: Three times a day (TID) | INTRAMUSCULAR | Status: AC
  Administered 2024-09-08 – 2024-09-09 (×4): 50 mg via INTRAVENOUS
  Filled 2024-09-08 (×4): qty 1

## 2024-09-08 MED ORDER — ARFORMOTEROL TARTRATE 15 MCG/2ML IN NEBU
15.0000 ug | INHALATION_SOLUTION | Freq: Two times a day (BID) | RESPIRATORY_TRACT | Status: DC
  Administered 2024-09-08 – 2024-09-13 (×6): 15 ug via RESPIRATORY_TRACT
  Filled 2024-09-08 (×13): qty 2

## 2024-09-08 MED ORDER — PHENYLEPHRINE 80 MCG/ML (10ML) SYRINGE FOR IV PUSH (FOR BLOOD PRESSURE SUPPORT)
PREFILLED_SYRINGE | INTRAVENOUS | Status: AC
  Filled 2024-09-08: qty 10

## 2024-09-08 MED ORDER — METHYLPREDNISOLONE SODIUM SUCC 125 MG IJ SOLR
60.0000 mg | Freq: Every day | INTRAMUSCULAR | Status: AC
  Administered 2024-09-08 – 2024-09-09 (×2): 60 mg via INTRAVENOUS
  Filled 2024-09-08 (×2): qty 2

## 2024-09-08 MED ORDER — ALBUMIN HUMAN 5 % IV SOLN: INTRAVENOUS | Status: DC | PRN

## 2024-09-08 MED ORDER — MENTHOL 3 MG MT LOZG: 1.0000 | LOZENGE | OROMUCOSAL | Status: DC | PRN

## 2024-09-08 MED ORDER — CEFAZOLIN SODIUM-DEXTROSE 2-4 GM/100ML-% IV SOLN: 2.0000 g | Freq: Three times a day (TID) | INTRAVENOUS | Status: DC

## 2024-09-08 MED ORDER — FAMOTIDINE IN NACL 20-0.9 MG/50ML-% IV SOLN
20.0000 mg | Freq: Two times a day (BID) | INTRAVENOUS | Status: DC
  Administered 2024-09-08 – 2024-09-09 (×3): 20 mg via INTRAVENOUS
  Filled 2024-09-08 (×3): qty 50

## 2024-09-08 MED ORDER — POVIDONE-IODINE 7.5 % EX SOLN: Freq: Once | CUTANEOUS | Status: DC

## 2024-09-08 MED ORDER — EPHEDRINE SULFATE-NACL 50-0.9 MG/10ML-% IV SOSY
PREFILLED_SYRINGE | INTRAVENOUS | Status: DC | PRN
  Administered 2024-09-08 (×2): 5 mg via INTRAVENOUS

## 2024-09-08 MED ORDER — MIDAZOLAM HCL 2 MG/2ML IJ SOLN
INTRAMUSCULAR | Status: AC
  Filled 2024-09-08: qty 2

## 2024-09-08 MED ORDER — MORPHINE SULFATE (PF) 2 MG/ML IV SOLN: 2.0000 mg | INTRAVENOUS | Status: DC | PRN

## 2024-09-08 MED ORDER — 0.9 % SODIUM CHLORIDE (POUR BTL) OPTIME
TOPICAL | Status: DC | PRN
  Administered 2024-09-08: 1000 mL

## 2024-09-08 MED ORDER — PRAVASTATIN SODIUM 40 MG PO TABS
20.0000 mg | ORAL_TABLET | Freq: Every day | ORAL | Status: DC
Start: 2024-09-09 — End: 2024-09-15
  Administered 2024-09-10 – 2024-09-15 (×6): 20 mg via ORAL
  Filled 2024-09-08 (×6): qty 1

## 2024-09-08 MED ORDER — FLEET ENEMA RE ENEM: 1.0000 | ENEMA | Freq: Once | RECTAL | Status: DC | PRN

## 2024-09-08 MED ORDER — PROPOFOL 1000 MG/100ML IV EMUL
0.0000 ug/kg/min | INTRAVENOUS | Status: DC
  Administered 2024-09-08: 50 ug/kg/min via INTRAVENOUS
  Administered 2024-09-08 – 2024-09-09 (×4): 40 ug/kg/min via INTRAVENOUS
  Filled 2024-09-08 (×4): qty 100

## 2024-09-08 MED ORDER — BUPIVACAINE-EPINEPHRINE 0.25% -1:200000 IJ SOLN
INTRAMUSCULAR | Status: DC | PRN
  Administered 2024-09-08: 40 mL via INTRAMUSCULAR

## 2024-09-08 MED ORDER — PHENYLEPHRINE HCL-NACL 20-0.9 MG/250ML-% IV SOLN
INTRAVENOUS | Status: DC | PRN
  Administered 2024-09-08: 40 ug/min via INTRAVENOUS

## 2024-09-08 MED ORDER — REVEFENACIN 175 MCG/3ML IN SOLN
175.0000 ug | Freq: Every day | RESPIRATORY_TRACT | Status: DC
  Administered 2024-09-09 – 2024-09-13 (×3): 175 ug via RESPIRATORY_TRACT
  Filled 2024-09-08 (×7): qty 3

## 2024-09-08 MED ORDER — CHLORHEXIDINE GLUCONATE CLOTH 2 % EX PADS
6.0000 | MEDICATED_PAD | Freq: Every day | CUTANEOUS | Status: DC
  Administered 2024-09-08 – 2024-09-15 (×5): 6 via TOPICAL

## 2024-09-08 MED ORDER — FENTANYL CITRATE (PF) 250 MCG/5ML IJ SOLN
INTRAMUSCULAR | Status: AC
  Filled 2024-09-08: qty 5

## 2024-09-08 MED ORDER — BISACODYL 5 MG PO TBEC: 5.0000 mg | DELAYED_RELEASE_TABLET | Freq: Every day | ORAL | Status: DC | PRN

## 2024-09-08 MED ORDER — BUPIVACAINE-EPINEPHRINE 0.25% -1:200000 IJ SOLN
INTRAMUSCULAR | Status: DC | PRN
  Administered 2024-09-08: 30 mL

## 2024-09-08 MED ORDER — EPINEPHRINE HCL 5 MG/250ML IV SOLN IN NS
INTRAVENOUS | Status: DC | PRN
  Administered 2024-09-08: 2 ug/min via INTRAVENOUS

## 2024-09-08 MED ORDER — FAMOTIDINE 20 MG PO TABS
20.0000 mg | ORAL_TABLET | Freq: Two times a day (BID) | ORAL | Status: DC
Start: 2024-09-08 — End: 2024-09-08

## 2024-09-08 MED ORDER — PROPOFOL 500 MG/50ML IV EMUL
INTRAVENOUS | Status: DC | PRN
  Administered 2024-09-08: 25 ug/kg/min via INTRAVENOUS

## 2024-09-08 MED ORDER — SODIUM CHLORIDE 0.9% FLUSH
3.0000 mL | Freq: Two times a day (BID) | INTRAVENOUS | Status: DC
  Administered 2024-09-08 – 2024-09-15 (×12): 3 mL via INTRAVENOUS

## 2024-09-08 MED ORDER — ACETAMINOPHEN 325 MG PO TABS
650.0000 mg | ORAL_TABLET | ORAL | Status: DC | PRN
  Administered 2024-09-09 – 2024-09-15 (×2): 650 mg via ORAL
  Filled 2024-09-08 (×2): qty 2

## 2024-09-08 MED ORDER — ONDANSETRON HCL 4 MG/2ML IJ SOLN: 4.0000 mg | Freq: Four times a day (QID) | INTRAMUSCULAR | Status: DC | PRN

## 2024-09-08 MED ORDER — GABAPENTIN 300 MG PO CAPS
600.0000 mg | ORAL_CAPSULE | Freq: Two times a day (BID) | ORAL | Status: DC
Start: 2024-09-08 — End: 2024-09-12
  Administered 2024-09-09 – 2024-09-12 (×6): 600 mg via ORAL
  Filled 2024-09-08 (×6): qty 2

## 2024-09-08 MED ORDER — LIDOCAINE 2% (20 MG/ML) 5 ML SYRINGE
INTRAMUSCULAR | Status: DC | PRN
  Administered 2024-09-08: 100 mg via INTRAVENOUS

## 2024-09-08 MED ORDER — BUPIVACAINE-EPINEPHRINE (PF) 0.25% -1:200000 IJ SOLN
INTRAMUSCULAR | Status: AC
  Filled 2024-09-08: qty 30

## 2024-09-08 MED ORDER — PHENYLEPHRINE HCL-NACL 20-0.9 MG/250ML-% IV SOLN: 25.0000 ug/min | INTRAVENOUS | Status: DC

## 2024-09-08 MED ORDER — VASOPRESSIN 20 UNIT/ML IV SOLN
INTRAVENOUS | Status: DC | PRN
  Administered 2024-09-08 (×6): 1 [IU] via INTRAVENOUS
  Administered 2024-09-08: 2 [IU] via INTRAVENOUS

## 2024-09-08 MED ORDER — ROCURONIUM BROMIDE 10 MG/ML (PF) SYRINGE
PREFILLED_SYRINGE | INTRAVENOUS | Status: AC
  Filled 2024-09-08: qty 10

## 2024-09-08 MED ORDER — ORAL CARE MOUTH RINSE: 15.0000 mL | OROMUCOSAL | Status: DC

## 2024-09-08 MED ORDER — BUDESONIDE 0.5 MG/2ML IN SUSP
0.5000 mg | Freq: Two times a day (BID) | RESPIRATORY_TRACT | Status: DC
  Administered 2024-09-08 – 2024-09-13 (×7): 0.5 mg via RESPIRATORY_TRACT
  Filled 2024-09-08 (×14): qty 2

## 2024-09-08 MED ORDER — IPRATROPIUM BROMIDE 0.03 % NA SOLN
2.0000 | Freq: Two times a day (BID) | NASAL | Status: DC
  Filled 2024-09-08 (×2): qty 30

## 2024-09-08 MED ORDER — ONDANSETRON HCL 4 MG PO TABS: 4.0000 mg | ORAL_TABLET | Freq: Four times a day (QID) | ORAL | Status: DC | PRN

## 2024-09-08 MED ORDER — ALBUTEROL SULFATE (2.5 MG/3ML) 0.083% IN NEBU: 3.0000 mL | INHALATION_SOLUTION | Freq: Four times a day (QID) | RESPIRATORY_TRACT | Status: DC | PRN

## 2024-09-08 MED ORDER — FENTANYL CITRATE PF 50 MCG/ML IJ SOSY
50.0000 ug | PREFILLED_SYRINGE | INTRAMUSCULAR | Status: DC | PRN
  Administered 2024-09-08 (×5): 100 ug via INTRAVENOUS
  Administered 2024-09-08: 200 ug via INTRAVENOUS
  Administered 2024-09-09 (×3): 150 ug via INTRAVENOUS
  Filled 2024-09-08: qty 2
  Filled 2024-09-08: qty 4
  Filled 2024-09-08: qty 3
  Filled 2024-09-08 (×3): qty 2
  Filled 2024-09-08: qty 3
  Filled 2024-09-08: qty 2
  Filled 2024-09-08: qty 3
  Filled 2024-09-08: qty 2

## 2024-09-08 MED ORDER — PHENOL 1.4 % MT LIQD: 1.0000 | OROMUCOSAL | Status: DC | PRN

## 2024-09-08 MED ORDER — OXYCODONE-ACETAMINOPHEN 5-325 MG PO TABS: 1.0000 | ORAL_TABLET | ORAL | Status: DC | PRN

## 2024-09-08 MED ORDER — DIPHENHYDRAMINE HCL 50 MG/ML IJ SOLN
INTRAMUSCULAR | Status: DC | PRN
  Administered 2024-09-08: 50 mg via INTRAVENOUS

## 2024-09-08 MED ORDER — LIDOCAINE 2% (20 MG/ML) 5 ML SYRINGE
INTRAMUSCULAR | Status: AC
  Filled 2024-09-08: qty 5

## 2024-09-08 MED ORDER — CEFAZOLIN SODIUM 1 G IJ SOLR
INTRAMUSCULAR | Status: AC
  Filled 2024-09-08: qty 20

## 2024-09-08 MED ORDER — ORAL CARE MOUTH RINSE: 15.0000 mL | Freq: Once | OROMUCOSAL | Status: AC

## 2024-09-08 MED ORDER — ROCURONIUM BROMIDE 10 MG/ML (PF) SYRINGE
PREFILLED_SYRINGE | INTRAVENOUS | Status: DC | PRN
  Administered 2024-09-08: 90 mg via INTRAVENOUS

## 2024-09-08 MED ORDER — CEFAZOLIN SODIUM-DEXTROSE 2-4 GM/100ML-% IV SOLN
2.0000 g | INTRAVENOUS | Status: AC
  Administered 2024-09-08: 2 g via INTRAVENOUS
  Filled 2024-09-08: qty 100

## 2024-09-08 MED ORDER — PHENYLEPHRINE 80 MCG/ML (10ML) SYRINGE FOR IV PUSH (FOR BLOOD PRESSURE SUPPORT)
PREFILLED_SYRINGE | INTRAVENOUS | Status: DC | PRN
  Administered 2024-09-08 (×3): 160 ug via INTRAVENOUS

## 2024-09-08 MED ORDER — PROPOFOL 10 MG/ML IV BOLUS
INTRAVENOUS | Status: AC
  Filled 2024-09-08: qty 20

## 2024-09-08 MED ORDER — SODIUM CHLORIDE 0.9 % IV SOLN: 250.0000 mL | INTRAVENOUS | Status: AC

## 2024-09-08 MED ORDER — BUDESON-GLYCOPYRROL-FORMOTEROL 160-9-4.8 MCG/ACT IN AERO
2.0000 | INHALATION_SPRAY | Freq: Two times a day (BID) | RESPIRATORY_TRACT | Status: DC
  Filled 2024-09-08: qty 5.9

## 2024-09-08 MED ORDER — STERILE WATER FOR IRRIGATION IR SOLN
Status: DC | PRN
  Administered 2024-09-08: 1000 mL

## 2024-09-08 MED ORDER — POTASSIUM CHLORIDE IN NACL 20-0.9 MEQ/L-% IV SOLN
INTRAVENOUS | Status: DC
  Filled 2024-09-08: qty 1000

## 2024-09-08 MED ORDER — THROMBIN 20000 UNITS EX SOLR
CUTANEOUS | Status: AC
  Filled 2024-09-08: qty 20000

## 2024-09-08 MED ORDER — EPINEPHRINE 1 MG/10ML IJ SOSY
PREFILLED_SYRINGE | INTRAMUSCULAR | Status: AC
  Filled 2024-09-08: qty 10

## 2024-09-08 MED ORDER — ORAL CARE MOUTH RINSE
15.0000 mL | OROMUCOSAL | Status: DC
  Administered 2024-09-08 – 2024-09-09 (×11): 15 mL via OROMUCOSAL

## 2024-09-08 MED ORDER — EPINEPHRINE 1 MG/10ML IJ SOSY
PREFILLED_SYRINGE | INTRAMUSCULAR | Status: DC | PRN
  Administered 2024-09-08 (×2): 20 ug via INTRAVENOUS
  Administered 2024-09-08: 10 ug via INTRAVENOUS

## 2024-09-08 MED ORDER — FENTANYL CITRATE (PF) 250 MCG/5ML IJ SOLN
INTRAMUSCULAR | Status: DC | PRN
  Administered 2024-09-08: 100 ug via INTRAVENOUS
  Administered 2024-09-08 (×3): 50 ug via INTRAVENOUS

## 2024-09-08 MED ORDER — SODIUM CHLORIDE 0.9% FLUSH: 3.0000 mL | INTRAVENOUS | Status: DC | PRN

## 2024-09-08 MED ORDER — HYDROCODONE-ACETAMINOPHEN 5-325 MG PO TABS: 1.0000 | ORAL_TABLET | ORAL | Status: DC | PRN

## 2024-09-08 MED ORDER — VASOPRESSIN 20 UNIT/ML IV SOLN
INTRAVENOUS | Status: AC
  Filled 2024-09-08: qty 1

## 2024-09-08 MED ORDER — SODIUM CHLORIDE 0.9 % IV SOLN
250.0000 mL | INTRAVENOUS | Status: AC
Start: 2024-09-08 — End: 2024-09-09

## 2024-09-08 MED ORDER — ACETAMINOPHEN 650 MG RE SUPP: 650.0000 mg | RECTAL | Status: DC | PRN

## 2024-09-08 MED ORDER — LACTATED RINGERS IV SOLN: INTRAVENOUS | Status: DC

## 2024-09-08 MED ORDER — FAMOTIDINE IN NACL 20-0.9 MG/50ML-% IV SOLN
INTRAVENOUS | Status: DC | PRN
  Administered 2024-09-08: 20 mg via INTRAVENOUS

## 2024-09-08 MED ORDER — PROPOFOL 10 MG/ML IV BOLUS
INTRAVENOUS | Status: DC | PRN
  Administered 2024-09-08: 30 mg via INTRAVENOUS
  Administered 2024-09-08: 150 mg via INTRAVENOUS

## 2024-09-08 MED ORDER — DEXAMETHASONE SODIUM PHOSPHATE 10 MG/ML IJ SOLN
INTRAMUSCULAR | Status: DC | PRN
  Administered 2024-09-08: 10 mg via INTRAVENOUS

## 2024-09-08 MED ORDER — ALUM & MAG HYDROXIDE-SIMETH 200-200-20 MG/5ML PO SUSP: 30.0000 mL | Freq: Four times a day (QID) | ORAL | Status: DC | PRN

## 2024-09-08 SURGICAL SUPPLY — 77 items
BAG COUNTER SPONGE SURGICOUNT (BAG) ×2 IMPLANT
BENZOIN TINCTURE PRP APPL 2/3 (GAUZE/BANDAGES/DRESSINGS) ×2 IMPLANT
BLADE CLIPPER SURG (BLADE) IMPLANT
BUR PRECISION FLUTE 5.0 (BURR) ×4 IMPLANT
BUR PRESCISION 1.7 ELITE (BURR) ×2 IMPLANT
BUR ROUND PRECISION 4.0 (BURR) IMPLANT
BUR SABER RD CUTTING 3.0 (BURR) IMPLANT
CAGE SABLE 10X26 6-12 0D (Cage) IMPLANT
CNTNR URN SCR LID CUP LEK RST (MISCELLANEOUS) ×2 IMPLANT
COVER BACK TABLE 60X90IN (DRAPES) ×2 IMPLANT
COVER MAYO STAND STRL (DRAPES) ×4 IMPLANT
COVER SURGICAL LIGHT HANDLE (MISCELLANEOUS) ×2 IMPLANT
DRAPE C-ARM 42X72 X-RAY (DRAPES) ×2 IMPLANT
DRAPE C-ARMOR (DRAPES) IMPLANT
DRAPE POUCH INSTRU U-SHP 10X18 (DRAPES) ×2 IMPLANT
DRAPE SURG 17X23 STRL (DRAPES) ×8 IMPLANT
DURAPREP 26ML APPLICATOR (WOUND CARE) ×2 IMPLANT
ELECT CAUTERY BLADE 6.4 (BLADE) ×2 IMPLANT
ELECTRODE BLDE 4.0 EZ CLN MEGD (MISCELLANEOUS) ×2 IMPLANT
ELECTRODE REM PT RTRN 9FT ADLT (ELECTROSURGICAL) ×2 IMPLANT
EVACUATOR SILICONE 100CC (DRAIN) IMPLANT
FILTER STRAW FLUID ASPIR (MISCELLANEOUS) ×2 IMPLANT
GAUZE 4X4 16PLY ~~LOC~~+RFID DBL (SPONGE) ×2 IMPLANT
GAUZE SPONGE 4X4 12PLY STRL (GAUZE/BANDAGES/DRESSINGS) ×2 IMPLANT
GLOVE BIO SURGEON STRL SZ 6.5 (GLOVE) ×2 IMPLANT
GLOVE BIO SURGEON STRL SZ8 (GLOVE) ×2 IMPLANT
GLOVE BIOGEL PI IND STRL 7.0 (GLOVE) ×2 IMPLANT
GLOVE BIOGEL PI IND STRL 8 (GLOVE) ×2 IMPLANT
GLOVE SURG ENC MOIS LTX SZ6.5 (GLOVE) ×2 IMPLANT
GOWN STRL REUS W/ TWL LRG LVL3 (GOWN DISPOSABLE) ×4 IMPLANT
GOWN STRL REUS W/ TWL XL LVL3 (GOWN DISPOSABLE) ×2 IMPLANT
IV CATH 14GX2 1/4 (CATHETERS) ×2 IMPLANT
KIT BASIN OR (CUSTOM PROCEDURE TRAY) ×2 IMPLANT
KIT POSITIONER JACKSON TABLE (MISCELLANEOUS) ×2 IMPLANT
KIT TURNOVER KIT B (KITS) ×2 IMPLANT
MARKER SKIN DUAL TIP RULER LAB (MISCELLANEOUS) ×4 IMPLANT
NDL 18GX1X1/2 (RX/OR ONLY) (NEEDLE) ×2 IMPLANT
NDL 22X1.5 STRL (OR ONLY) (MISCELLANEOUS) ×4 IMPLANT
NDL HYPO 25GX1X1/2 BEV (NEEDLE) ×2 IMPLANT
NDL SPNL 18GX3.5 QUINCKE PK (NEEDLE) ×4 IMPLANT
NEEDLE 18GX1X1/2 (RX/OR ONLY) (NEEDLE) ×1 IMPLANT
NEEDLE 22X1.5 STRL (OR ONLY) (MISCELLANEOUS) ×2 IMPLANT
NEEDLE HYPO 25GX1X1/2 BEV (NEEDLE) ×1 IMPLANT
NEEDLE SPNL 18GX3.5 QUINCKE PK (NEEDLE) ×2 IMPLANT
NS IRRIG 1000ML POUR BTL (IV SOLUTION) ×2 IMPLANT
PACK LAMINECTOMY ORTHO (CUSTOM PROCEDURE TRAY) ×2 IMPLANT
PACK UNIVERSAL I (CUSTOM PROCEDURE TRAY) ×2 IMPLANT
PAD ARMBOARD POSITIONER FOAM (MISCELLANEOUS) ×4 IMPLANT
PATTIES SURGICAL .5 X.5 (GAUZE/BANDAGES/DRESSINGS) ×2 IMPLANT
PATTIES SURGICAL .5 X1 (DISPOSABLE) IMPLANT
PATTIES SURGICAL .5X1.5 (GAUZE/BANDAGES/DRESSINGS) IMPLANT
PUTTY DBX 2.5CC DEPUY (Putty) IMPLANT
ROD PRE BENT EXP 40MM (Rod) IMPLANT
ROD PRE BENT EXPEDIUM 35MM (Rod) IMPLANT
SCREW CORT FIX FEN 5.5X7X30MM (Screw) IMPLANT
SCREW CORTICAL VIPER 7X35 (Screw) IMPLANT
SCREW SET SINGLE INNER (Screw) IMPLANT
SPONGE INTESTINAL PEANUT (DISPOSABLE) ×2 IMPLANT
SPONGE SURGIFOAM ABS GEL 100 (HEMOSTASIS) ×2 IMPLANT
STRIP CLOSURE SKIN 1/2X4 (GAUZE/BANDAGES/DRESSINGS) ×4 IMPLANT
SURGIFLO W/THROMBIN 8M KIT (HEMOSTASIS) IMPLANT
SUT MNCRL AB 4-0 PS2 18 (SUTURE) ×2 IMPLANT
SUT VIC AB 0 CT1 18XCR BRD 8 (SUTURE) ×2 IMPLANT
SUT VIC AB 1 CT1 18XCR BRD 8 (SUTURE) ×2 IMPLANT
SUT VIC AB 2-0 CT2 18 VCP726D (SUTURE) ×2 IMPLANT
SYR 20ML LL LF (SYRINGE) ×4 IMPLANT
SYR BULB IRRIG 60ML STRL (SYRINGE) ×2 IMPLANT
SYR CONTROL 10ML LL (SYRINGE) ×4 IMPLANT
SYR TB 1ML LUER SLIP (SYRINGE) ×2 IMPLANT
TAP EXPEDIUM DL 4.35 (INSTRUMENTS) IMPLANT
TAP EXPEDIUM DL 5.0 (INSTRUMENTS) IMPLANT
TAP EXPEDIUM DL 6.0 (INSTRUMENTS) IMPLANT
TAP EXPEDIUM DL 7X2 (INSTRUMENTS) IMPLANT
TRAY FOLEY MTR SLVR 16FR STAT (SET/KITS/TRAYS/PACK) ×2 IMPLANT
TUBE FUNNEL GL DISP (ORTHOPEDIC DISPOSABLE SUPPLIES) IMPLANT
WATER STERILE IRR 1000ML POUR (IV SOLUTION) ×2 IMPLANT
YANKAUER SUCT BULB TIP NO VENT (SUCTIONS) ×2 IMPLANT

## 2024-09-08 NOTE — Transfer of Care (Signed)
 Immediate Anesthesia Transfer of Care Note  Patient: David King  Procedure(s) Performed: LEFT-SIDED LUMBAR 4- LUMBAR 5 TRANSFORAMINAL LUMBAR INTERBODY FUSION AND DECOMPRESSION WITH INSTRUMENTATION AND ALLOGRAFT (Left: Spine Lumbar)  Patient Location: ICU  Anesthesia Type:General  Level of Consciousness: Patient remains intubated per anesthesia plan  Airway & Oxygen  Therapy: Patient remains intubated per anesthesia plan  Post-op Assessment: Report given to RN and Post -op Vital signs reviewed and stable  Post vital signs: Reviewed and stable  Last Vitals:  Vitals Value Taken Time  BP    Temp    Pulse    Resp    SpO2      Last Pain:  Vitals:   09/08/24 0713  TempSrc:   PainSc: 2          Complications: No notable events documented.

## 2024-09-08 NOTE — Op Note (Signed)
 PATIENT NAME: David King   MEDICAL RECORD NO.:   [id number]   DATE OF BIRTH: 1956/05/18    DATE OF PROCEDURE: 09/08/2024                               OPERATIVE REPORT     PREOPERATIVE DIAGNOSES: 1. Left-sided lumbar radiculopathy 2. L4-5 spinal stenosis 3. Status post previous L4-5 decompression   POSTOPERATIVE DIAGNOSES: 1. Left-sided lumbar radiculopathy 2. L4-5 spinal stenosis 3. Status post previous L4-5 decompression   PROCEDURES: 1. Revision L4/5 decompression 2. Left-sided L4-5 transforaminal lumbar interbody fusion 3. Right-sided L4-5 posterolateral fusion 4. Insertion of interbody device x1 (Globus expandable intervertebral spacer) 5. Placement of segmental posterior instrumentation L4, L5 bilaterally  6. Use of local autograft 7. Use of morselized allograft - DBX mix 8. Intraoperative use of fluoroscopy 9. Placement of arterial line to right radial artery   SURGEON:  Oneil Priestly, MD   ASSISTANT:  Ileana Clara, PA-C   ANESTHESIA:  General endotracheal anesthesia.   COMPLICATIONS:  None   DISPOSITION:  Stable   ESTIMATED BLOOD LOSS:  100cc   INDICATIONS FOR SURGERY:  Briefly,  Mr. Brookens is a pleasant 68 year old male who did present to me with severe and ongoing pain and weakness in the left leg. I did feel that the symptoms were secondary to the findings noted above. The patient failed conservative care and did wish to proceed with the procedure  noted above.   OPERATIVE DETAILS:  On 09/08/2024, the patient was brought to surgery and general endotracheal anesthesia was administered.  The patient was placed prone on a well-padded flat Jackson bed with a spinal frame.  Antibiotics were given and a time-out procedure was performed. The back was prepped and draped in the usual fashion.  A midline incision was made overlying the L4-5 intervertebral spaces.  The fascia was incised at the midline.  The paraspinal musculature was bluntly swept laterally.   Anatomic landmarks for the pedicles were exposed. Using fluoroscopy, I did cannulate the L4 and L5 pedicles bilaterally, using a medial to lateral cortical trajectory technique.  Of note, the patient's bone was noted to be osteoporotic.  At this point, 6 x 30 mm screws were placed into the right pedicles, and a 40 mm rod was placed into the tulip heads of the screw, and caps were also placed and provisionally tightened.    At this point, I did note that the patient's blood pressure decreased substantially to approximately 50s over 20s.  This was sustained over the next few minutes.  I then communicated with the anesthesia team, and we did make a decision to roll the patient supine.  At this point, sterile 4 x 4's were packed into the wound, and Ioban was placed over the 4 x 4's and wound and lower back.  The patient was then rolled supine.  It was clearly evident that the patient's face was swollen, and his abdomen was bright red.  It was felt by myself and the anesthesia team that the patient had an anaphylactic reaction to some unknown medication. The pressure increased slightly, but was still low.  I continued to communicate with the anesthesia team.  They did take various methods to maintain a blood pressure, provided various different medications.  Anesthesiologist did attempt to place an arterial line into the left radial artery, but those attempts were unsuccessful.  I therefore obtained an arterial line set up, and  placed an arterial line into the right radial artery, in order to closely  monitor the patient's blood pressure. Eventually, the blood pressure did increase to normal and stabilized.  I again communicated with the anesthesiologist, Dr. Cleotilde.  He did feel that the patient's pressure was stabilized, and that it was safe to proceed with the remainder of the surgery.  I then contacted the patient's wife and did discuss the situation with her.  At this point, the patient was rolled prone back  onto the spinal frame and the back was prepped and draped in the usual sterile fashion.  Sterility of the lumbar wound was maintained.  The gauze previously packed into the wound was removed and then I proceeded with the procedure as usual.  Of note, the patient's blood pressure continued to remain stable for the duration of the procedure.  At this point, bone wax was placed into the cannulated pedicle holes on the left side. I then proceeded with the decompressive aspect of the procedure at the L4-5 level.  A full facetectomy was performed on the left at L4-5.  There was abundant granulation tissue noted from the prior decompression.  As such, this was a rather meticulous portion of the procedure, however, I was able to develop a plane between the lamina and dura and traversing left L5 nerve, and exiting left L4 nerve.  With gentle medial retraction of the left L5 nerve, I was able to identify the prominent herniated disc fragment ventral to it.  It was removed in multiple fragments.  These disc fragments were located in the foraminal region, and in the lateral recess.  The left L4 and L5 nerves were entirely decompressed after removal of the fragments. With an assistant holding medial retraction of the traversing left L5 nerve, I did perform an annulotomy at the posterolateral aspect of the L4-5 intervertebral space.  I then used a series of curettes and pituitary rongeurs to perform a thorough and complete intervertebral diskectomy.  The intervertebral space was then liberally packed with autograft as well as allograft in the form of DBX mix, as was the appropriate-sized intervertebral spacer.  The spacer was then tamped into position in the usual fashion, and expanded to 8.7 mm in height. I was very pleased with the press-fit of the spacer.  I then placed 7 mm screws on the left at L4 and L5. A 40-mm rod was then placed and caps were placed. The distraction was then released on the contralateral side.   All caps were then locked.    I then turned my attention to the posterolateral fusion portion of the procedure.  On the right side, a high-speed bur was used to decorticate the right L4-5 facet joint and posterolateral gutter.  Additional autograft from the decompression, and allograft was packed into the the right L4-5 facet joint and posterolateral gutter.  The wound was copiously irrigated with a total of approximately 3 L prior to placing the bone graft.  The wound was explored for any undue bleeding and there was no substantial bleeding encountered.  Gel-Foam was placed over the laminectomy site.  The wound was then closed in layers using #1 Vicryl followed by 2-0 Vicryl, followed by 4-0 Monocryl.  Benzoin and Steri-Strips were applied followed by sterile dressing.     Of note, Ileana Clara was my assistant throughout surgery, and did aid in retraction, suctioning, the decompression, placement of the hardware, and closure.  Dr. Cleotilde did elect to keep the patient intubated, and  did recommend transfer to the ICU setting.  I then met and spoke with the wife to apprise her of the situation.     Oneil Priestly, MD

## 2024-09-08 NOTE — Anesthesia Procedure Notes (Signed)
 Procedure Name: Intubation Date/Time: 09/08/2024 8:36 AM  Performed by: Jolynn Mage, CRNAPre-anesthesia Checklist: Patient identified, Patient being monitored, Timeout performed, Emergency Drugs available and Suction available Patient Re-evaluated:Patient Re-evaluated prior to induction Oxygen  Delivery Method: Circle System Utilized Preoxygenation: Pre-oxygenation with 100% oxygen  Induction Type: IV induction Ventilation: Mask ventilation without difficulty Laryngoscope Size: Miller and 2 Grade View: Grade I Tube type: Oral Tube size: 7.5 mm Number of attempts: 1 Airway Equipment and Method: Stylet Placement Confirmation: ETT inserted through vocal cords under direct vision, positive ETCO2 and breath sounds checked- equal and bilateral Secured at: 22 cm Tube secured with: Tape Dental Injury: Teeth and Oropharynx as per pre-operative assessment

## 2024-09-08 NOTE — H&P (Signed)
 PREOPERATIVE H&P  Chief Complaint: Left leg pain and weakness  HPI: David King is a 68 y.o. male who presents with ongoing pain in the left leg  MRI reveals severe stenosis on the left at L4-5, due to a large disc herniation.  The patient is status post a previous decompression at L4-5.  Patient has failed multiple forms of conservative care and continues to have pain (see office notes for additional details regarding the patient's full course of treatment)  Past Medical History:  Diagnosis Date   Anxiety    new dx   Arthritis    lumbar   Atherosclerotic vascular disease    Cataract    COPD (chronic obstructive pulmonary disease) (HCC)    Diverticulosis    Elevated PSA    Headache(784.0)    MIGRAINES   Hypertension 2021   Iritis    CHRONIC IN LEFT EYE - SOME VISIAL IMPAIRMENT IN LEFT EYE   Neuromuscular disorder (HCC)    left leg/foot,pinched siactic nerve   Pain    PAIN LEFT HIP AND DOWN LT LEG WITH NUMBNESS IN LEFT LEG--PT STATES SCIATIC NERVE IMPINGEMENT - PT PLANS BACK IN THE NEAR SURGERY.   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   Renal cysts, acquired, bilateral 03/19/2013   several simple , CT   Urinary frequency    AND NOCTURIA   Past Surgical History:  Procedure Laterality Date   BACK SURGERY     CARDIAC CATHETERIZATION  03/10/2018   CATARACT EXTRACTION W/ INTRAOCULAR LENS IMPLANT Left    EYE SURGERY     RETINAL SURGERY LEFT EYE   HYDROCELE EXCISION Left 03/05/2013   Procedure: HYDROCELECTOMY ADULT;  Surgeon: Donnice Gwenyth Brooks, MD;  Location: [facility];  Service: Urology;  Laterality: Left;   INCISIONAL HERNIA REPAIR N/A 06/18/2022   Procedure: REPAIR OF INTERNAL HERNIA;  Surgeon: Rubin Calamity, MD;  Location: [facility] OR;  Service: General;  Laterality: N/A;   INGUINAL HERNIA REPAIR Bilateral AGE 21   INSERTION OF MESH N/A 06/11/2022   Procedure: INSERTION OF MESH;  Surgeon:  Rubin Calamity, MD;  Location: [facility] OR;  Service: General;  Laterality: N/A;   INSERTION OF MESH N/A 06/18/2022   Procedure: INSERTION OF MESH;  Surgeon: Rubin Calamity, MD;  Location: [facility] OR;  Service: General;  Laterality: N/A;   KYPHOPLASTY N/A 05/08/2023   Procedure: THORACIC SEVEN KYPHOPLASTY;  Surgeon: Beuford Anes, MD;  Location: [facility] OR;  Service: Orthopedics;  Laterality: N/A;   LAPAROSCOPY N/A 06/18/2022   Procedure: LAPAROSCOPY DIAGNOSTIC;  Surgeon: Rubin Calamity, MD;  Location: [facility] OR;  Service: General;  Laterality: N/A;   LYMPHADENECTOMY Bilateral 05/06/2013   Procedure: REDGIE;  Surgeon: Noretta Ferrara, MD;  Location: [facility];  Service: Urology;  Laterality: Bilateral;   PROSTATE BIOPSY N/A 03/05/2013   Procedure: PROSTATE BIOPSY and ultrasound;  Surgeon: Donnice Gwenyth Brooks, MD;  Location: [facility];  Service: Urology;  Laterality: N/A;   REMOVAL BURSA SAC, LEFT ELBOW  1996   ROBOT ASSISTED LAPAROSCOPIC RADICAL PROSTATECTOMY N/A 05/06/2013   Procedure: ROBOTIC ASSISTED LAPAROSCOPIC RADICAL PROSTATECTOMY LEVEL 3;  Surgeon: Noretta Ferrara, MD;  Location: [facility];  Service: Urology;  Laterality: N/A;   TONSILLECTOMY     as achild   XI ROBOTIC ASSISTED VENTRAL HERNIA N/A 06/11/2022   Procedure: ROBOTIC INCISIONAL HERNIA REPAIR WITH MESH;  Surgeon: Rubin Calamity, MD;  Location: [facility] OR;  Service: General;  Laterality:  N/A;   Social History   Socioeconomic History   Marital status: Married    Spouse name: Not on file   Number of children: Not on file   Years of education: Not on file   Highest education level: Not on file  Occupational History   Not on file  Tobacco Use   Smoking status: Former    Current packs/day: 0.00    Average packs/day: 2.0 packs/day for 47.0 years (94.0 ttl pk-yrs)    Types: Cigarettes    Start date: 12/31/1969    Quit date: 12/31/2016    Years since quitting: 7.6   Smokeless tobacco: Never   Tobacco comments:    Patient is  currently smoke free since January 2018  Vaping Use   Vaping status: Never Used  Substance and Sexual Activity   Alcohol use: Yes    Alcohol/week: 14.0 standard drinks of alcohol    Types: 14 Shots of liquor per week    Comment: couple of drinks a night   Drug use: No   Sexual activity: Never  Other Topics Concern   Not on file  Social History Narrative   Not on file   Social Drivers of Health   Financial Resource Strain: Low Risk  (05/05/2023)   Received from [facility]   Overall Financial Resource Strain (CARDIA)    Difficulty of Paying Living Expenses: Not hard at all  Food Insecurity: No Food Insecurity (05/05/2023)   Received from [facility]   Hunger Vital Sign    Within the past 12 months, you worried that your food would run out before you got the money to buy more.: Never true    Within the past 12 months, the food you bought just didn't last and you didn't have money to get more.: Never true  Transportation Needs: No Transportation Needs (05/05/2023)   Received from [facility] - Transportation    Lack of Transportation (Medical): No    Lack of Transportation (Non-Medical): No  Physical Activity: Unknown (05/05/2023)   Received from [facility]   Exercise Vital Sign    On average, how many days per week do you engage in moderate to strenuous exercise (like a brisk walk)?: 0 days    Minutes of Exercise per Session: Not on file  Stress: No Stress Concern Present (05/05/2023)   Received from [facility] Occupational [facility] - Occupational Stress Questionnaire    Feeling of Stress : Only a little  Social Connections: Socially Integrated (05/05/2023)   Received from [facility]   Social Network    How would you rate your social network (family, work, friends)?: Good participation with social networks   Family History  Problem Relation Age of Onset   Cancer Father        prostate cancer   Cancer Paternal Grandfather         prostate   Colon cancer Neg Hx    Colon polyps Neg Hx    Esophageal cancer Neg Hx    Rectal cancer Neg Hx    Stomach cancer Neg Hx    No Known Allergies Prior to Admission medications   Medication Sig Start Date End Date Taking? Authorizing Provider  cholecalciferol (VITAMIN D3) 25 MCG (1000 UNIT) tablet Take 1,000 Units by mouth daily.   Yes [provider]  diphenhydramine -acetaminophen  (TYLENOL  PM EXTRA STRENGTH) 25-500 MG TABS tablet Take 2 tablets by mouth at bedtime.   Yes [provider]  gabapentin  (NEURONTIN ) 300 MG capsule Take 600 mg by mouth 2 (two) times daily.   Yes [provider]  ipratropium (ATROVENT ) 0.03 % nasal spray Place 2 sprays into both nostrils 2 (two) times daily. 02/10/24  Yes [provider]  pravastatin  (PRAVACHOL ) 20 MG tablet Take 20 mg by mouth in the morning. 04/24/22  Yes [provider]  TRELEGY ELLIPTA  100-62.5-25 MCG/ACT AEPB USE 1 INHALATION BY MOUTH INTO  THE LUNGS ONCE DAILY AT THE SAME TIME EACH DAY 06/09/23  Yes Mannam, Praveen, MD  albuterol  (VENTOLIN  HFA) 108 (90 Base) MCG/ACT inhaler USE 1-2 PUFFS EVERY 6 HOURS AS NEEDED FOR WHEEZING OR SHORTNESS OF BREATH. 01/03/22   Mannam, Praveen, MD  Coal Tar Extract [phone] PSORIASIS MEDICATED EX) Apply 1 Application topically 3 (three) times daily as needed (psoriasis).    [provider]     All other systems have been reviewed and were otherwise negative with the exception of those mentioned in the HPI and as above.  Physical Exam: Vitals:   09/08/24 0647  BP: (!) 141/86  Pulse: 68  Resp: 17  Temp: 97.9 F (36.6 C)  SpO2: 98%    Body mass index is 29.53 kg/m.  General: Alert, no acute distress Cardiovascular: No pedal edema Respiratory: No cyanosis, no use of accessory musculature Skin: No lesions in the area of chief complaint Neurologic: Sensation intact distally Psychiatric: Patient is competent for consent with normal mood and  affect Lymphatic: No axillary or cervical lymphadenopathy   Assessment/Plan: DISC HERNIATION Plan for Procedure(s): TRANSFORAMINAL LUMBAR INTERBODY FUSION (TLIF) WITH PEDICLE SCREW FIXATION 1 LEVEL, L4/5   Oneil LITTIE Priestly, MD 09/08/2024 8:06 AM

## 2024-09-08 NOTE — OR Nursing (Signed)
 1022 PATIENTS WIFE GINNY CALLED  ON CELL AT [phone] PER DR DUMONSKI REQUESTED TO INFORM PATIENT IS STABLE AND THAT HE MIGHT REMAIN INTUBATED.  WE KEEP HER UPDATED..... Rondale Nies, RN

## 2024-09-08 NOTE — Anesthesia Procedure Notes (Addendum)
 Arterial Line Insertion Start/End9/09/2024 9:52 AM, 09/08/2024 9:57 AM Performed by: Cleotilde Butler Dade, MD, attending  Patient location: OR. Preanesthetic checklist: patient identified, IV checked, site marked, risks and benefits discussed, surgical consent, monitors and equipment checked, pre-op evaluation, timeout performed and anesthesia consent Right, radial was placed Catheter size: 20 G Hand hygiene performed  and maximum sterile barriers used   Attempts: 2 Procedure performed without using ultrasound guided technique. Following insertion, dressing applied and Biopatch. Post procedure assessment: normal and unchanged  Additional procedure comments: Patient was under anesthesia .

## 2024-09-08 NOTE — Progress Notes (Signed)
    Patient underwent L4-5 TLIF this morning with anesthesia complication of anaphylactic reaction to unknown medication approx 1hr into surgery. Pt had extreme hypotension and was subsequently stabilized. Anesthesia is unsure as to which medication may have caused the allergic reaction. Anesthesia recommended pt maintain intubation and go to ICU post-op. We counseled with anesthesia regarding PO antibiotics and they did a test dose of 2cc ancef  in OR without adverse reaction and rec we maintain standard PO protocol of ancef  x2 doses. Anesthesia to consult with critical care team.   Pt to be discharged to [facility] from ICU when felt stable. OK to remove foley upon extubation prior to ICU D/C. Standard back precautions in place and PT/OT to be started upon D/C from ICU in anticipation of D/C home in 1-2 days   Ileana Clara, PA-C Orthopaedic Spine Surgery [phone]

## 2024-09-08 NOTE — Anesthesia Postprocedure Evaluation (Signed)
 Anesthesia Post Note  Patient: David King  Procedure(s) Performed: LEFT-SIDED LUMBAR 4- LUMBAR 5 TRANSFORAMINAL LUMBAR INTERBODY FUSION AND DECOMPRESSION WITH INSTRUMENTATION AND ALLOGRAFT (Left: Spine Lumbar)     Patient location during evaluation: ICU Anesthesia Type: General Level of consciousness: patient remains intubated per anesthesia plan Pain management: satisfactory to patient Vital Signs Assessment: post-procedure vital signs reviewed and stable Respiratory status: patient on ventilator - see flowsheet for VS Cardiovascular status: blood pressure returned to baseline and stable Postop Assessment: no apparent nausea or vomiting Anesthetic complications: yes Comments: Patient with probable anaphylactic reaction with unknown trigger.  Patient was prone and surgical procedure had been started when sudden significant drop in blood pressure occurred.  No obvious medication administration coincided with this event.  Pressure did not change significantly with numerous administrations of Ephedrine , Phenylephrine  and Vasopressin .  Decision was made to flip patient supine and start an arterial line.  Patient was noted to have red skin and rash over most of his body surface as well as angioedema.  Meanwhile several non code doses of epinephrine  occurred and an epinephrine  infusion was started.  Blood pressure responded to the epinephrine  and was kept in a normal range throughout the rest of the case.  The epinephrine  infusion was able to be weaned off by the end of the case but patient was still requiring a phenylephrine  infusion.  Decision was made to transfer the patient to the ICU intubated due to the severe reaction.    No notable events documented.  Last Vitals:  Vitals:   09/08/24 1244 09/08/24 1300  BP:    Pulse: 71   Resp: 16   Temp:  (!) 33.3 C  SpO2: 96%     Last Pain:  Vitals:   09/08/24 1300  TempSrc: Axillary  PainSc:    Pain Goal:                    David King

## 2024-09-08 NOTE — Consult Note (Addendum)
 NAME:  David King, MRN:  [id number], DOB:  1956/04/05, LOS: 0 ADMISSION DATE:  09/08/2024, CONSULTATION DATE:  09/18/24 REFERRING MD:  Beuford , CHIEF COMPLAINT:  endotracheally intubated    History of Present Illness:  68 yo M w lumbar radiculopathy presented 9/10 for elective L4-5 decompression. Case c/b hypotensive episode felt possibly anaphylactic shock (hypotensive, turned red, possible tongue swelling) and was started on epi gtt + given decadron  pepcid  and benadryl . Het subsequently weaned off epi and pt remained stable facilitating the rest of the case. He was kept intubated and was taken to the ICU post op in this setting.   In chart review-- PMH COPD, prior smoking, CAD, PAD, detatched retina L eye    Pertinent  Medical History  COPD CAD PAD Prior tobacco use  Lumbar radiculopathy  Significant Hospital Events: Including procedures, antibiotic start and stop dates in addition to other pertinent events   9/10 possible anaphylactic shock during L4-5 decompression. Admitted to ICU post op, intubated   Interim History / Subjective:  The patient arrived to the ICU intubated and on pressors   Objective    Blood pressure (!) 141/86, pulse 71, temperature (!) 92 F (33.3 C), temperature source Axillary, resp. rate 16, height 5' 9 (1.753 m), weight 93.3 kg, SpO2 96%.    Vent Mode: PRVC FiO2 (%):  [40 %] 40 % Set Rate:  [16 bmp] 16 bmp Vt Set:  [560 mL] 560 mL PEEP:  [5 cmH20] 5 cmH20   Intake/Output Summary (Last 24 hours) at 09/08/2024 1442 Last data filed at 09/08/2024 1245 Gross per 24 hour  Intake 3650 ml  Output 100 ml  Net 3550 ml   Filed Weights   09/08/24 0647 09/08/24 1300  Weight: 90.7 kg 93.3 kg    Examination: General: critically ill appearing older adult M intubated sedated  Neuro: sedated  HENT: L pupillary defect ETT secure. Eyes and lips are swollen  Lungs: mechanically ventilated  Cardiovascular: rr Abdomen: soft round  GU: foley Extremities:  no obvious acute joint deformity  Skin: hives on abdomen and both groins. Scattered ecchymosis on arms   Resolved problem list   Assessment and Plan    S/p L4-5 decompression  P -per ortho  -I am discontinuing the ancef  ppx and ordering vanc - I have paged out to Dr beuford to discuss this, await call back    Endotracheally intubated  COPD P -CXR ABG -VAP, pulm hygiene -RASS -1 -to remain intubated 9/10 with ongoing swelling  -triple therapy   Anaphylaxis, improving anaphylactic shock  -not sure what the catalyst is, largely because the timing of the ancef  is unclear to me -it was signed out to our team that ancef  not favored since he did ok w test dose, but on my review of anesthetic record it looks like this event occurred before ancef  test dose. However, when I look at past meds, it looks like he the 0645 pre of ancef  at 845 which timeline wise would align with this anaphylaxis  P -Ancef  listed as allergy  -1L LR -wean off neo. If we need add;l hemodynamic support would favor epi + fluids  -BID pepcid , q8 benadryl , solumedrol  -will send CBC BMP & follow CBGs    I spoke w pt wife and updated her on plan    Labs   CBC: Recent Labs  Lab 09/06/24 1100  WBC 6.0  HGB 14.9  HCT 45.4  MCV 100.4*  PLT 165    Basic Metabolic  Panel: Recent Labs  Lab 09/06/24 1100  NA 138  K 4.3  CL 103  CO2 27  GLUCOSE 97  BUN 14  CREATININE 1.18  CALCIUM 9.3   GFR: Estimated Creatinine Clearance: 65.7 mL/min (by C-G formula based on SCr of 1.18 mg/dL). Recent Labs  Lab 09/06/24 1100  WBC 6.0    Liver Function Tests: Recent Labs  Lab 09/06/24 1100  AST 27  ALT 44  ALKPHOS 45  BILITOT 0.8  PROT 7.0  ALBUMIN  3.8   No results for input(s): LIPASE, AMYLASE in the last 168 hours. No results for input(s): AMMONIA in the last 168 hours.  ABG No results found for: PHART, PCO2ART, PO2ART, HCO3, TCO2, ACIDBASEDEF, O2SAT   Coagulation  Profile: No results for input(s): INR, PROTIME in the last 168 hours.  Cardiac Enzymes: No results for input(s): CKTOTAL, CKMB, CKMBINDEX, TROPONINI in the last 168 hours.  HbA1C: Hgb A1c MFr Bld  Date/Time Value Ref Range Status  06/17/2022 03:13 AM 5.3 4.8 - 5.6 % Final    Comment:    (NOTE) Pre diabetes:          5.7%-6.4%  Diabetes:              >6.4%  Glycemic control for   <7.0% adults with diabetes     CBG: Recent Labs  Lab 09/08/24 1302  GLUCAP 171*    Review of Systems:   Unable to obtain, intubated sedated   Past Medical History:  He,  has a past medical history of Anxiety, Arthritis, Atherosclerotic vascular disease, Cataract, COPD (chronic obstructive pulmonary disease) (HCC), Diverticulosis, Elevated PSA, Headache(784.0), Hypertension (2021), Iritis, Neuromuscular disorder (HCC), Pain, Peripheral vascular disease (HCC), Prostate cancer (HCC) (03/05/2013), Renal cysts, acquired, bilateral (03/19/2013), and Urinary frequency.   Surgical History:   Past Surgical History:  Procedure Laterality Date   BACK SURGERY     CARDIAC CATHETERIZATION  03/10/2018   CATARACT EXTRACTION W/ INTRAOCULAR LENS IMPLANT Left    EYE SURGERY     RETINAL SURGERY LEFT EYE   HYDROCELE EXCISION Left 03/05/2013   Procedure: HYDROCELECTOMY ADULT;  Surgeon: Donnice Gwenyth Brooks, MD;  Location[facility];  Service: Urology;  Laterality: Left;   INCISIONAL HERNIA REPAIR N/A 06/18/2022   Procedure: REPAIR OF INTERNAL HERNIA;  Surgeon: Rubin Calamity, MD;  Location[facility] OR;  Service: General;  Laterality: N/A;   INGUINAL HERNIA REPAIR Bilateral AGE 27   INSERTION OF MESH N/A 06/11/2022   Procedure: INSERTION OF MESH;  Surgeon: Rubin Calamity, MD;  Location: [facility] OR;  Service: General;  Laterality: N/A;   INSERTION OF MESH N/A 06/18/2022   Procedure: INSERTION OF MESH;  Surgeon: Rubin Calamity, MD;  Location: [facility] OR;  Service: General;  Laterality: N/A;    KYPHOPLASTY N/A 05/08/2023   Procedure: THORACIC SEVEN KYPHOPLASTY;  Surgeon: Beuford Anes, MD;  Location: [facility] OR;  Service: Orthopedics;  Laterality: N/A;   LAPAROSCOPY N/A 06/18/2022   Procedure: LAPAROSCOPY DIAGNOSTIC;  Surgeon: Rubin Calamity, MD;  Location[facility] OR;  Service: General;  Laterality: N/A;   LYMPHADENECTOMY Bilateral 05/06/2013   Procedure: REDGIE;  Surgeon: Noretta Ferrara, MD;  Location: [facility];  Service: Urology;  Laterality: Bilateral;   PROSTATE BIOPSY N/A 03/05/2013   Procedure: PROSTATE BIOPSY and ultrasound;  Surgeon: Donnice Gwenyth Brooks, MD;  Location: [facility];  Service: Urology;  Laterality: N/A;   REMOVAL BURSA SAC, LEFT ELBOW  1996   ROBOT ASSISTED LAPAROSCOPIC RADICAL PROSTATECTOMY N/A 05/06/2013   Procedure:  ROBOTIC ASSISTED LAPAROSCOPIC RADICAL PROSTATECTOMY LEVEL 3;  Surgeon: Noretta Ferrara, MD;  Location: [facility];  Service: Urology;  Laterality: N/A;   TONSILLECTOMY     as achild   XI ROBOTIC ASSISTED VENTRAL HERNIA N/A 06/11/2022   Procedure: ROBOTIC INCISIONAL HERNIA REPAIR WITH MESH;  Surgeon: Rubin Calamity, MD;  Location: [facility] OR;  Service: General;  Laterality: N/A;     Social History:   reports that he quit smoking about 7 years ago. His smoking use included cigarettes. He started smoking about 54 years ago. He has a 94 pack-year smoking history. He has never used smokeless tobacco. He reports current alcohol use of about 14.0 standard drinks of alcohol per week. He reports that he does not use drugs.   Family History:  His family history includes Cancer in his father and paternal grandfather. There is no history of Colon cancer, Colon polyps, Esophageal cancer, Rectal cancer, or Stomach cancer.   Allergies No Known Allergies   Home Medications  Prior to Admission medications   Medication Sig Start Date End Date Taking? Authorizing Provider  cholecalciferol (VITAMIN D3) 25 MCG (1000 UNIT) tablet Take 1,000 Units by mouth  daily.   Yes [provider]  diphenhydramine -acetaminophen  (TYLENOL  PM EXTRA STRENGTH) 25-500 MG TABS tablet Take 2 tablets by mouth at bedtime.   Yes [provider]  gabapentin  (NEURONTIN ) 300 MG capsule Take 600 mg by mouth 2 (two) times daily.   Yes [provider]  ipratropium (ATROVENT ) 0.03 % nasal spray Place 2 sprays into both nostrils 2 (two) times daily. 02/10/24  Yes [provider]  pravastatin  (PRAVACHOL ) 20 MG tablet Take 20 mg by mouth in the morning. 04/24/22  Yes [provider]  TRELEGY ELLIPTA  100-62.5-25 MCG/ACT AEPB USE 1 INHALATION BY MOUTH INTO  THE LUNGS ONCE DAILY AT THE SAME TIME EACH DAY 06/09/23  Yes Mannam, Praveen, MD  albuterol  (VENTOLIN  HFA) 108 (90 Base) MCG/ACT inhaler USE 1-2 PUFFS EVERY 6 HOURS AS NEEDED FOR WHEEZING OR SHORTNESS OF BREATH. 01/03/22   Mannam, Praveen, MD  Coal Tar Extract [phone] PSORIASIS MEDICATED EX) Apply 1 Application topically 3 (three) times daily as needed (psoriasis).    [provider]     Critical care time: 36 min      CRITICAL CARE Performed by: Ronnald FORBES Gave   Total critical care time: 36 minutes  Critical care time was exclusive of separately billable procedures and treating other patients.  Critical care was necessary to treat or prevent imminent or life-threatening deterioration.  Critical care was time spent personally by me on the following activities: development of treatment plan with patient and/or surrogate as well as nursing, discussions with consultants, evaluation of patient's response to treatment, examination of patient, obtaining history from patient or surrogate, ordering and performing treatments and interventions, ordering and review of laboratory studies, ordering and review of radiographic studies, pulse oximetry and re-evaluation of patient's condition.  Ronnald Gave MSN, AGACNP-BC [place] Pulmonary/Critical Care Medicine Amion for pager  09/08/2024,  2:42 PM

## 2024-09-09 DIAGNOSIS — J449 Chronic obstructive pulmonary disease, unspecified: Secondary | ICD-10-CM | POA: Diagnosis not present

## 2024-09-09 DIAGNOSIS — Z981 Arthrodesis status: Secondary | ICD-10-CM | POA: Diagnosis not present

## 2024-09-09 LAB — GLUCOSE, CAPILLARY
Glucose-Capillary: 134 mg/dL — ABNORMAL HIGH (ref 70–99)
Glucose-Capillary: 108 mg/dL — ABNORMAL HIGH (ref 70–99)
Glucose-Capillary: 107 mg/dL — ABNORMAL HIGH (ref 70–99)
Glucose-Capillary: 104 mg/dL — ABNORMAL HIGH (ref 70–99)
Glucose-Capillary: 110 mg/dL — ABNORMAL HIGH (ref 70–99)

## 2024-09-09 LAB — CBC
HCT: 37.7 % — ABNORMAL LOW (ref 39.0–52.0)
Hemoglobin: 12.8 g/dL — ABNORMAL LOW (ref 13.0–17.0)
MCH: 33.2 pg (ref 26.0–34.0)
MCHC: 34 g/dL (ref 30.0–36.0)
MCV: 97.9 fL (ref 80.0–100.0)
Platelets: 124 K/uL — ABNORMAL LOW (ref 150–400)
RBC: 3.85 MIL/uL — ABNORMAL LOW (ref 4.22–5.81)
RDW: 13.8 % (ref 11.5–15.5)
WBC: 8.5 K/uL (ref 4.0–10.5)
nRBC: 0 % (ref 0.0–0.2)

## 2024-09-09 LAB — HEMOGLOBIN A1C
Hgb A1c MFr Bld: 5.2 % (ref 4.8–5.6)
Mean Plasma Glucose: 102.54 mg/dL

## 2024-09-09 LAB — BASIC METABOLIC PANEL WITH GFR
Anion gap: 11 (ref 5–15)
BUN: 10 mg/dL (ref 8–23)
CO2: 22 mmol/L (ref 22–32)
Calcium: 8.5 mg/dL — ABNORMAL LOW (ref 8.9–10.3)
Chloride: 103 mmol/L (ref 98–111)
Creatinine, Ser: 0.98 mg/dL (ref 0.61–1.24)
GFR, Estimated: >60 mL/min (ref >60)
Glucose, Bld: 134 mg/dL — ABNORMAL HIGH (ref 70–99)
Potassium: 4 mmol/L (ref 3.5–5.1)
Sodium: 136 mmol/L (ref 135–145)

## 2024-09-09 LAB — MAGNESIUM: Magnesium: 1.7 mg/dL (ref 1.7–2.4)

## 2024-09-09 LAB — PHOSPHORUS: Phosphorus: 4 mg/dL (ref 2.5–4.6)

## 2024-09-09 LAB — TRIGLYCERIDES: Triglycerides: 101 mg/dL (ref <150)

## 2024-09-09 MED ORDER — FENTANYL CITRATE PF 50 MCG/ML IJ SOSY: 25.0000 ug | PREFILLED_SYRINGE | INTRAMUSCULAR | Status: DC | PRN

## 2024-09-09 MED ORDER — OXYCODONE-ACETAMINOPHEN 5-325 MG PO TABS
1.0000 | ORAL_TABLET | ORAL | Status: DC | PRN
  Administered 2024-09-10 – 2024-09-13 (×17): 2 via ORAL
  Administered 2024-09-13 (×2): 1 via ORAL
  Administered 2024-09-13 – 2024-09-15 (×4): 2 via ORAL
  Filled 2024-09-09 (×23): qty 2

## 2024-09-09 MED ORDER — LABETALOL HCL 5 MG/ML IV SOLN: 10.0000 mg | INTRAVENOUS | Status: DC | PRN

## 2024-09-09 MED ORDER — DEXMEDETOMIDINE HCL IN NACL 400 MCG/100ML IV SOLN
INTRAVENOUS | Status: AC
  Administered 2024-09-09: 0.4 ug/kg/h via INTRAVENOUS
  Filled 2024-09-09: qty 100

## 2024-09-09 MED ORDER — ORAL CARE MOUTH RINSE: 15.0000 mL | OROMUCOSAL | Status: DC | PRN

## 2024-09-09 MED ORDER — MAGNESIUM SULFATE 2 GM/50ML IV SOLN
2.0000 g | Freq: Once | INTRAVENOUS | Status: AC
  Administered 2024-09-09: 2 g via INTRAVENOUS
  Filled 2024-09-09: qty 50

## 2024-09-09 MED ORDER — ORAL CARE MOUTH RINSE
15.0000 mL | OROMUCOSAL | Status: DC
  Administered 2024-09-09 – 2024-09-15 (×23): 15 mL via OROMUCOSAL

## 2024-09-09 MED ORDER — DEXMEDETOMIDINE HCL IN NACL 400 MCG/100ML IV SOLN: 0.0000 ug/kg/h | INTRAVENOUS | Status: DC

## 2024-09-09 MED ORDER — HYDROCODONE-ACETAMINOPHEN 5-325 MG PO TABS
1.0000 | ORAL_TABLET | Freq: Four times a day (QID) | ORAL | Status: DC | PRN
  Administered 2024-09-09 – 2024-09-14 (×4): 1 via ORAL
  Filled 2024-09-09 (×4): qty 1

## 2024-09-09 NOTE — Progress Notes (Signed)
 PT Cancellation Note  Patient Details Name: David King MRN: [id number] DOB: [birth year]   Cancelled Treatment:    Reason Eval/Treat Not Completed: (P) Patient not medically ready Pt remains intubated. Plan for extubation today. Will follow back as able for Evaluation after extubation today.  David King PT, DPT Acute Rehabilitation Services Please use secure chat or  Call Office [phone]     David King [facility] 09/09/2024, 9:08 AM

## 2024-09-09 NOTE — Progress Notes (Addendum)
  D/w Dr. Beuford, he has seen/eval pt re numb foot  Pending txf out of unit and confirmed ok to change attending back over to Dr Beuford at that time  ___________________________________  Seen in follow up several times today  Has a numb R foot. Pt/RN report ortho is aware.  He is doing well from resp perspective and is stable to txf our to ICU I have been trying to reach ortho team to discuss his foot and to discuss transfer out of ICU/coordinate taking over as primary, have not heard back.    In meantime, orders placed for txf to progressive.     Ronnald Gave MSN, AGACNP-BC [facility] Pulmonary/Critical Care Medicine 09/09/2024, 2:55 PM

## 2024-09-09 NOTE — Plan of Care (Signed)
 Received from 66M.  Oriented to room and surroundings.  [place] he is just happy to be here , as he went through an ordeal during his surgery.  Back brace brought with him.  Said his right foot is still totally numb from mid foot to tip of toes.     Problem: Education: Goal: Knowledge of General Education information will improve Description: Including pain rating scale, medication(s)/side effects and non-pharmacologic comfort measures Outcome: Progressing   Problem: Health Behavior/Discharge Planning: Goal: Ability to manage health-related needs will improve Outcome: Progressing   Problem: Clinical Measurements: Goal: Ability to maintain clinical measurements within normal limits will improve Outcome: Progressing Goal: Will remain free from infection Outcome: Progressing Goal: Diagnostic test results will improve Outcome: Progressing Goal: Respiratory complications will improve Outcome: Progressing Goal: Cardiovascular complication will be avoided Outcome: Progressing   Problem: Activity: Goal: Risk for activity intolerance will decrease Outcome: Progressing   Problem: Nutrition: Goal: Adequate nutrition will be maintained Outcome: Progressing   Problem: Coping: Goal: Level of anxiety will decrease Outcome: Progressing   Problem: Elimination: Goal: Will not experience complications related to bowel motility Outcome: Progressing Goal: Will not experience complications related to urinary retention Outcome: Progressing   Problem: Pain Managment: Goal: General experience of comfort will improve and/or be controlled Outcome: Progressing   Problem: Safety: Goal: Ability to remain free from injury will improve Outcome: Progressing   Problem: Skin Integrity: Goal: Risk for impaired skin integrity will decrease Outcome: Progressing   Problem: Education: Goal: Ability to verbalize activity precautions or restrictions will improve Outcome: Progressing Goal: Knowledge of the  prescribed therapeutic regimen will improve Outcome: Progressing Goal: Understanding of discharge needs will improve Outcome: Progressing   Problem: Activity: Goal: Ability to avoid complications of mobility impairment will improve Outcome: Progressing Goal: Ability to tolerate increased activity will improve Outcome: Progressing Goal: Will remain free from falls Outcome: Progressing   Problem: Bowel/Gastric: Goal: Gastrointestinal status for postoperative course will improve Outcome: Progressing   Problem: Clinical Measurements: Goal: Ability to maintain clinical measurements within normal limits will improve Outcome: Progressing Goal: Postoperative complications will be avoided or minimized Outcome: Progressing Goal: Diagnostic test results will improve Outcome: Progressing   Problem: Pain Management: Goal: Pain level will decrease Outcome: Progressing   Problem: Skin Integrity: Goal: Will show signs of wound healing Outcome: Progressing   Problem: Health Behavior/Discharge Planning: Goal: Identification of resources available to assist in meeting health care needs will improve Outcome: Progressing   Problem: Bladder/Genitourinary: Goal: Urinary functional status for postoperative course will improve Outcome: Progressing   Problem: Activity: Goal: Ability to tolerate increased activity will improve Outcome: Progressing   Problem: Respiratory: Goal: Ability to maintain a clear airway and adequate ventilation will improve Outcome: Progressing   Problem: Role Relationship: Goal: Method of communication will improve Outcome: Progressing   Problem: Safety: Goal: Non-violent Restraint(s) Outcome: Progressing   Problem: Education: Goal: Ability to describe self-care measures that may prevent or decrease complications (Diabetes Survival Skills Education) will improve Outcome: Progressing Goal: Individualized Educational Video(s) Outcome: Progressing   Problem:  Coping: Goal: Ability to adjust to condition or change in health will improve Outcome: Progressing   Problem: Fluid Volume: Goal: Ability to maintain a balanced intake and output will improve Outcome: Progressing   Problem: Health Behavior/Discharge Planning: Goal: Ability to identify and utilize available resources and services will improve Outcome: Progressing Goal: Ability to manage health-related needs will improve Outcome: Progressing   Problem: Metabolic: Goal: Ability to maintain appropriate glucose levels will improve  Outcome: Progressing   Problem: Nutritional: Goal: Maintenance of adequate nutrition will improve Outcome: Progressing Goal: Progress toward achieving an optimal weight will improve Outcome: Progressing   Problem: Skin Integrity: Goal: Risk for impaired skin integrity will decrease Outcome: Progressing   Problem: Tissue Perfusion: Goal: Adequacy of tissue perfusion will improve Outcome: Progressing

## 2024-09-09 NOTE — Progress Notes (Signed)
 [facility] ADULT ICU REPLACEMENT PROTOCOL   The patient does apply for the [facility] Adult ICU Electrolyte Replacment Protocol based on the criteria listed below:   1.Exclusion criteria: TCTS, ECMO, Dialysis, and Myasthenia Gravis patients 2. Is GFR >/= 30 ml/min? Yes.    Patient's GFR today is >60 3. Is SCr </= 2? Yes.   Patient's SCr is 0.98 mg/dL 4. Did SCr increase >/= 0.5 in 24 hours? No. 5.Pt's weight >40kg  Yes.   6. Abnormal electrolyte(s): Magnesium   7. Electrolytes replaced per protocol 8.  Call MD STAT for K+ </= 2.5, Phos </= 1, or Mag </= 1 Physician:  Dr. Epimenio Medico A Elby Blackwelder 09/09/2024 6:36 AM

## 2024-09-09 NOTE — Progress Notes (Signed)
    Patient remained intubated overnight CC team concerned about giving ancef  and is giving vancomycin  instead   Physical Exam: Vitals:   09/09/24 0600 09/09/24 0615  BP:    Pulse: 78 83  Resp: 16 16  Temp:    SpO2: 98% 99%    Dressing in place Patient intubated  POD #1 s/p L4/5 decompression and fusion  - hopefully patient extubated today - up with PT/OT after extubation - Percocet for pain, Robaxin  for muscle spasms - d/c home once patient is extubated and felt to be stable - spoke with cc team, hope to revaluate after extubation

## 2024-09-09 NOTE — Progress Notes (Signed)
 Patient extubated per MD order with RN and family member at bedside. Positive cuff leak before extubation. Patient placed on 4L nasal cannula. Vitals stable. Able to speak.

## 2024-09-09 NOTE — TOC CM/SW Note (Signed)
 Transition of Care [facility]) - Inpatient Brief Assessment   Patient Details  Name: David King MRN: [id number] Date of Birth: July 19, 1956  Transition of Care [facility]) CM/SW Contact:    Tom-Johnson, Harvest Muskrat, RN Phone Number: 09/09/2024, 12:50 PM   Clinical Narrative:   Patient presented to the hospital with Lt Leg pain and Weakness.  MRI showed severe Lt L4-5 Stenosis d/t a large Disc Herniation.  Patient underwent Transforaminal Lumbar Interbody Fusion (TLIF) yesterday 09/08/24 by Dr. Beuford with Orthopedics, remained intubated and transferred to ICU. Patient was extubated today 09/09/24 and on 4L O2. Patient has hx of previous L4-f Decompression.     From home with wife, has five adopted children. Retired, independent with care and drive self prior to admit. Has a cane, walker, rollator and shower seat at home.  PCP is [name], MD and uses [facility] on [address]  Awaiting PT/OT eval for disposition. Patient not Medically ready for discharge.  CM will continue to follow as patient progresses with care towards discharge.       Transition of Care Asessment: Insurance and Status: Insurance coverage has been reviewed Patient has primary care physician: Yes Home environment has been reviewed: Yes Prior level of function:: Modified Independent Prior/Current Home Services: No current home services Social Drivers of Health Review: SDOH reviewed no interventions necessary Readmission risk has been reviewed: Yes Transition of care needs: transition of care needs identified, TOC will continue to follow

## 2024-09-09 NOTE — Progress Notes (Signed)
 NAME:  David King, MRN:  [id number], DOB:  [birth year], LOS: 1 ADMISSION DATE:  09/08/2024, CONSULTATION DATE:  09/18/24 REFERRING MD:  [place] , CHIEF COMPLAINT:  endotracheally intubated    History of Present Illness:  68 yo M w lumbar radiculopathy presented 9/10 for elective L4-5 decompression. Case c/b hypotensive episode felt possibly anaphylactic shock (hypotensive, turned red, possible tongue swelling) and was started on epi gtt + given decadron  pepcid  and benadryl . Het subsequently weaned off epi and pt remained stable facilitating the rest of the case. He was kept intubated and was taken to the ICU post op in this setting.   In chart review-- PMH COPD, prior smoking, CAD, PAD, detatched retina L eye    Pertinent  Medical History  COPD CAD PAD Prior tobacco use  Lumbar radiculopathy  Significant Hospital Events: Including procedures, antibiotic start and stop dates in addition to other pertinent events   9/10 possible anaphylactic shock during L4-5 decompression. Admitted to ICU post op, intubated  9/11 improved swelling   Interim History / Subjective:  Agitated   Failed SBT   Objective    Blood pressure (!) 149/86, pulse 86, temperature 98.9 F (37.2 C), temperature source Axillary, resp. rate 16, height 5' 9 (1.753 m), weight 93.3 kg, SpO2 95%.    Vent Mode: CPAP;PSV FiO2 (%):  [40 %] 40 % Set Rate:  [16 bmp] 16 bmp Vt Set:  [560 mL] 560 mL PEEP:  [5 cmH20] 5 cmH20 Pressure Support:  [5 cmH20] 5 cmH20 Plateau Pressure:  [12 cmH20-19 cmH20] 14 cmH20   Intake/Output Summary (Last 24 hours) at 09/09/2024 1005 Last data filed at 09/09/2024 0900 Gross per 24 hour  Intake 5430.83 ml  Output 3900 ml  Net 1530.83 ml   Filed Weights   09/08/24 0647 09/08/24 1300  Weight: 90.7 kg 93.3 kg    Examination: General: critically ill older M agitated  Neuro: awake following commands HENT: decreasing orbital swelling, no tongue swelling, improved lower lip swelling,  ongoing upper lip swelling. ETT secure  Lungs: CTA  Cardiovascular: rrr  Abdomen: round soft  GU: foley Extremities: no obvious acute joint deformity Rrad art line  Skin: resolved hives   Resolved problem list  Anaphylactic shock  Assessment and Plan    S/p L4-5 decompression  Post op pain  P -per ortho   Endotracheally intubated  COPD P -wean MV as able  -triple therapy  -hopefully extubate 9/11 -- failed SBT this morning w no pt effort.  -bilat wrist restraints -- patient has expressed that is confident he will not resist pulling out his ETT if not restrained.  -derlirium precautions, writing to communicate etc   Anaphylaxis Presumed ancef  allergy   P -Ancef  listed as allergy  -BID pepcid  (will continue for duration of MV), q8 benadryl  (should complete 9/11) -rcvd decadron  w anesthesia then solumedrol x2 doses 9/10 afternoon and 9/11     Labs   CBC: Recent Labs  Lab 09/06/24 1100 09/08/24 1434 09/08/24 1455 09/09/24 0530  WBC 6.0 13.0*  --  8.5  HGB 14.9 13.8 13.6 12.8*  HCT 45.4 40.7 40.0 37.7*  MCV 100.4* 97.4  --  97.9  PLT 165 155  --  124*    Basic Metabolic Panel: Recent Labs  Lab 09/06/24 1100 09/08/24 1434 09/08/24 1455 09/09/24 0530  NA 138 136 138 136  K 4.3 4.0 4.1 4.0  CL 103 106  --  103  CO2 27 22  --  22  GLUCOSE 97  161*  --  134*  BUN 14 13  --  10  CREATININE 1.18 1.03  --  0.98  CALCIUM 9.3 8.3*  --  8.5*  MG  --   --   --  1.7  PHOS  --   --   --  4.0   GFR: Estimated Creatinine Clearance: 79.1 mL/min (by C-G formula based on SCr of 0.98 mg/dL). Recent Labs  Lab 09/06/24 1100 09/08/24 1434 09/09/24 0530  WBC 6.0 13.0* 8.5    Liver Function Tests: Recent Labs  Lab 09/06/24 1100 09/08/24 1434  AST 27 28  ALT 44 34  ALKPHOS 45 32*  BILITOT 0.8 0.7  PROT 7.0 5.5*  ALBUMIN  3.8 3.2*   No results for input(s): LIPASE, AMYLASE in the last 168 hours. No results for input(s): AMMONIA in the last 168  hours.  ABG    Component Value Date/Time   PHART 7.351 09/08/2024 1455   PCO2ART 40.2 09/08/2024 1455   PO2ART 89 09/08/2024 1455   HCO3 22.9 09/08/2024 1455   TCO2 24 09/08/2024 1455   ACIDBASEDEF 3.0 (H) 09/08/2024 1455   O2SAT 97 09/08/2024 1455     Coagulation Profile: No results for input(s): INR, PROTIME in the last 168 hours.  Cardiac Enzymes: No results for input(s): CKTOTAL, CKMB, CKMBINDEX, TROPONINI in the last 168 hours.  HbA1C: Hgb A1c MFr Bld  Date/Time Value Ref Range Status  09/09/2024 05:30 AM 5.2 4.8 - 5.6 % Final    Comment:    (NOTE) Diagnosis of Diabetes The following HbA1c ranges recommended by the American Diabetes Association (ADA) may be used as an aid in the diagnosis of diabetes mellitus.  Hemoglobin             Suggested A1C NGSP%              Diagnosis  <5.7                   Non Diabetic  5.7-6.4                Pre-Diabetic  >6.4                   Diabetic  <7.0                   Glycemic control for                       adults with diabetes.    06/17/2022 03:13 AM 5.3 4.8 - 5.6 % Final    Comment:    (NOTE) Pre diabetes:          5.7%-6.4%  Diabetes:              >6.4%  Glycemic control for   <7.0% adults with diabetes     CBG: Recent Labs  Lab 09/08/24 1525 09/08/24 1958 09/08/24 2334 09/09/24 0306 09/09/24 0739  GLUCAP 157* 194* 142* 134* 108*   CRITICAL CARE Performed by: Ronnald FORBES Gave   Total critical care time: 38 minutes  Critical care time was exclusive of separately billable procedures and treating other patients. Critical care was necessary to treat or prevent imminent or life-threatening deterioration.  Critical care was time spent personally by me on the following activities: development of treatment plan with patient and/or surrogate as well as nursing, discussions with consultants, evaluation of patient's response to treatment, examination of patient, obtaining history from patient or  surrogate, ordering and  performing treatments and interventions, ordering and review of laboratory studies, ordering and review of radiographic studies, pulse oximetry and re-evaluation of patient's condition.  Ronnald Gave MSN, AGACNP-BC [facility] Amion for pager 09/09/2024, 10:05 AM

## 2024-09-09 NOTE — Progress Notes (Signed)
 I evaluated the patient earlier today, and he did discuss the feeling of numbness in his right foot.  I have been in surgery and just got out of surgery, which is why I was not able to be contacted.  I have no concerns from the standpoint of his surgery.  Will continue to follow this however.

## 2024-09-09 NOTE — Progress Notes (Signed)
 PT Cancellation Note  Patient Details Name: David King MRN: [id number] DOB: September 25, 1956   Cancelled Treatment:    Reason Eval/Treat Not Completed: (P) Medical issues which prohibited therapy Pt with no feeling and active ROM in R foot and ankle. Critical Care PA, to contact Orthopedic Surgeon and is awaiting response. PT will follow back tomorrow for Eval.   Almarie B. Fleeta Lapidus PT, DPT Acute Rehabilitation Services Please use secure chat or  Call Office [phone]    Almarie KATHEE Fleeta [facility] 09/09/2024, 2:21 PM

## 2024-09-10 ENCOUNTER — Encounter (HOSPITAL_COMMUNITY): Payer: Self-pay | Admitting: Orthopedic Surgery

## 2024-09-10 DIAGNOSIS — Z981 Arthrodesis status: Secondary | ICD-10-CM | POA: Diagnosis not present

## 2024-09-10 LAB — GLUCOSE, CAPILLARY
Glucose-Capillary: 100 mg/dL — ABNORMAL HIGH (ref 70–99)
Glucose-Capillary: 105 mg/dL — ABNORMAL HIGH (ref 70–99)
Glucose-Capillary: 106 mg/dL — ABNORMAL HIGH (ref 70–99)
Glucose-Capillary: 117 mg/dL — ABNORMAL HIGH (ref 70–99)
Glucose-Capillary: 150 mg/dL — ABNORMAL HIGH (ref 70–99)
Glucose-Capillary: 97 mg/dL (ref 70–99)
Glucose-Capillary: 97 mg/dL (ref 70–99)

## 2024-09-10 MED ORDER — POLYETHYLENE GLYCOL 3350 17 G PO PACK
17.0000 g | PACK | Freq: Every day | ORAL | Status: DC
  Administered 2024-09-14 – 2024-09-15 (×2): 17 g via ORAL
  Filled 2024-09-10 (×3): qty 1

## 2024-09-10 MED ORDER — BOOST PLUS PO LIQD
237.0000 mL | Freq: Three times a day (TID) | ORAL | Status: DC
  Administered 2024-09-11 – 2024-09-12 (×4): 237 mL via ORAL
  Filled 2024-09-10 (×17): qty 237

## 2024-09-10 MED ORDER — DOCUSATE SODIUM 100 MG PO CAPS
100.0000 mg | ORAL_CAPSULE | Freq: Two times a day (BID) | ORAL | Status: DC
  Administered 2024-09-10 – 2024-09-15 (×9): 100 mg via ORAL
  Filled 2024-09-10 (×10): qty 1

## 2024-09-10 MED ORDER — TAMSULOSIN HCL 0.4 MG PO CAPS
0.4000 mg | ORAL_CAPSULE | Freq: Every day | ORAL | Status: DC
  Administered 2024-09-10 – 2024-09-15 (×6): 0.4 mg via ORAL
  Filled 2024-09-10 (×6): qty 1

## 2024-09-10 NOTE — Evaluation (Signed)
 Occupational Therapy Evaluation Patient Details Name: David King MRN: [id number] DOB: 1956-07-26 Today's Date: 09/10/2024   History of Present Illness   68 y.o. male presents to [facility]/08/24 for elective L4-L5 decompression surgery complicated with anaphylactic shock likely 2/2 cephalosporins. Intubated and ICU stay from 9/10-9/11. PMHx: COPD, CAD, PAD, tobacco use, lumbar radiculopathy, detached retina L eye     Clinical Impressions Pt admitted for above, PTA pt reports being mod I using hurrycane and ind with ADLs. Pt currently presenting with R foot numbness and generalized weakness throughout. Pt needing mod A +2 to assist with OOB mobility to pivot in room and max A to CGA for ADLs. Increased difficulty offloading his LLE but progressed throughout session. OT to continue following pt to progress as able. Patient has the potential to reach Mod I and demos the ability to tolerate 3 hours of therapy. Pt would benefit from an intensive rehab program to help maximize functional independence.      If plan is discharge home, recommend the following:   A lot of help with walking and/or transfers;A lot of help with bathing/dressing/bathroom;Assistance with cooking/housework;Assist for transportation     Functional Status Assessment   Patient has had a recent decline in their functional status and demonstrates the ability to make significant improvements in function in a reasonable and predictable amount of time.     Equipment Recommendations   BSC/3in1;Other (comment) (RW)     Recommendations for Other Services   Rehab consult     Precautions/Restrictions   Precautions Precautions: Fall;Back Precaution Booklet Issued: Yes (comment) Recall of Precautions/Restrictions: Intact Required Braces or Orthoses: Spinal Brace Spinal Brace: Thoracolumbosacral orthotic;Applied in sitting position Restrictions Weight Bearing Restrictions Per Provider Order: No     Mobility Bed  Mobility Overal bed mobility: Needs Assistance Bed Mobility: Rolling, Sidelying to Sit Rolling: Contact guard assist Sidelying to sit: Mod assist, Used rails       General bed mobility comments: able to demonstrate log roll technique with no cueing, ModA to assist with trunk elevation    Transfers Overall transfer level: Needs assistance Equipment used: Rolling walker (2 wheels) Transfers: Sit to/from Stand, Bed to chair/wheelchair/BSC Sit to Stand: Mod assist, +2 physical assistance, +2 safety/equipment     Step pivot transfers: Mod assist, +2 physical assistance, +2 safety/equipment     General transfer comment: ModAx2 for boost-up and steadying assist. Pt initially keeps knees flexed with forward flexed posture. With time and cues, able to correct. Initially had difficulty stepping with LLE with need to weight-shift to offload. Progressed to being able to take steps without having to weight-shift      Balance Overall balance assessment: Mild deficits observed, not formally tested                                         ADL either performed or assessed with clinical judgement   ADL Overall ADL's : Needs assistance/impaired Eating/Feeding: Sitting;Set up   Grooming: Sitting;Wash/dry face;Set up   Upper Body Bathing: Sitting;Set up   Lower Body Bathing: Moderate assistance;Sitting/lateral leans   Upper Body Dressing : Sitting;Minimal assistance   Lower Body Dressing: Maximal assistance;Sitting/lateral leans   Toilet Transfer: Stand-pivot;BSC/3in1;Rolling walker (2 wheels);+2 for physical assistance;+2 for safety/equipment;Moderate assistance   Toileting- Clothing Manipulation and Hygiene: Moderate assistance;+2 for safety/equipment       Functional mobility during ADLs: +2 for safety/equipment;+2 for physical  assistance;Moderate assistance;Rolling walker (2 wheels)       Vision Baseline Vision/History: 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     Perception         Praxis         Pertinent Vitals/Pain Pain Assessment Pain Assessment: Faces Faces Pain Scale: Hurts even more Pain Location: low back Pain Descriptors / Indicators: Aching, Discomfort Pain Intervention(s): Monitored during session, Limited activity within patient's tolerance, Repositioned     Extremity/Trunk Assessment Upper Extremity Assessment Upper Extremity Assessment: Generalized weakness;RUE deficits/detail;LUE deficits/detail RUE Deficits / Details: weak grip strength, denies sensation changes. LUE Deficits / Details: weak grip strength, denies sensation changes.   Lower Extremity Assessment Lower Extremity Assessment: RLE deficits/detail;LLE deficits/detail RLE Deficits / Details: Hip flexion 3/5, Knee ext 4+/5, Limited ankle DF ROM- 3/5 RLE Sensation: decreased light touch (decreased sensation on plantar surface of foot) LLE Deficits / Details: Hip flexion 3/5, Knee ext 4+/5, Ankle DF 4+/5 LLE Sensation: WNL   Cervical / Trunk Assessment Cervical / Trunk Assessment: Back Surgery   Communication Communication Communication: No apparent difficulties   Cognition Arousal: Alert Behavior During Therapy: WFL for tasks assessed/performed                                 Following commands: Impaired Following commands impaired: Only follows one step commands consistently, Follows multi-step commands inconsistently     Cueing  General Comments   Cueing Techniques: Verbal cues;Tactile cues  Pt c/o dizziness with increased OOB activity   Exercises     Shoulder Instructions      Home Living Family/patient expects to be discharged to:: Private residence Living Arrangements: Spouse/significant other Available Help at Discharge: Family;Available 24 hours/day Type of Home: House Home Access:  Stairs to enter [employer] of Steps: 4 Entrance Stairs-Rails: Can reach both Home Layout: One level     Bathroom Shower/Tub: Producer, television/film/video: Handicapped height     Home Equipment: Agricultural consultant (2 wheels);Shower seat;Grab bars - tub/shower;Cane - single point;Adaptive equipment;Hand held shower head;Other (comment) (hurry cane) Adaptive Equipment: Reacher        Prior Functioning/Environment Prior Level of Function : Independent/Modified Independent;Driving             Mobility Comments: mod I with hurry cane ADLs Comments: ind    OT Problem List: Pain;Impaired balance (sitting and/or standing);Decreased activity tolerance;Decreased strength   OT Treatment/Interventions: Self-care/ADL training;Therapeutic exercise;Patient/family education;Balance training;Therapeutic activities;DME and/or AE instruction      OT Goals(Current goals can be found in the care plan section)   Acute Rehab OT Goals Patient Stated Goal: To get better OT Goal Formulation: With patient Time For Goal Achievement: 09/24/24 Potential to Achieve Goals: Good ADL Goals Pt Will Perform Lower Body Bathing: sitting/lateral leans;with set-up;with adaptive equipment Pt Will Perform Lower Body Dressing: with min assist;sit to/from stand Pt Will Transfer to Toilet: with contact guard assist;bedside commode;ambulating Additional ADL Goal #1: Pt will verbalize understanding of 3/3 back precautions.   OT Frequency:  Min 2X/week    Co-evaluation PT/OT/SLP Co-Evaluation/Treatment: Yes Reason for Co-Treatment: For patient/therapist safety;To address functional/ADL transfers PT goals addressed during session: Mobility/safety with mobility;Balance;Proper use of DME OT goals addressed during session: ADL's and self-care      AM-PAC OT 6 Clicks Daily Activity     Outcome  Measure Help from another person eating meals?: A Little Help from another person taking care of  personal grooming?: A Little Help from another person toileting, which includes using toliet, bedpan, or urinal?: A Lot Help from another person bathing (including washing, rinsing, drying)?: A Lot Help from another person to put on and taking off regular upper body clothing?: A Little Help from another person to put on and taking off regular lower body clothing?: A Lot 6 Click Score: 15   End of Session Equipment Utilized During Treatment: Gait belt;Rolling walker (2 wheels);Back brace Nurse Communication: Mobility status;Precautions (back)  Activity Tolerance: Patient tolerated treatment well Patient left: in chair;with call bell/phone within reach;with chair alarm set  OT Visit Diagnosis: Unsteadiness on feet (R26.81);Other abnormalities of gait and mobility (R26.89);Muscle weakness (generalized) (M62.81);Pain Pain - Right/Left: Right Pain - part of body: Ankle and joints of foot                Time: 9057-8971 OT Time Calculation (min): 46 min Charges:  OT General Charges $OT Visit: 1 Visit OT Evaluation $OT Eval Moderate Complexity: 1 Mod OT Treatments $Self Care/Home Management : 8-22 mins  09/10/2024  AB, OTR/L  Acute Rehabilitation Services  Office: [phone]   Curtistine JONETTA Das 09/10/2024, 12:11 PM

## 2024-09-10 NOTE — Progress Notes (Signed)
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
## 2024-09-10 NOTE — Progress Notes (Signed)
 Inpatient Rehab Admissions Coordinator:    I met with Pt. To discuss potential CIR admit. He is interested. States that his wife can provide 24/7 min assist. I will follow and send case to insurance once rehab MD note is in.   Leita Kleine, MS, CCC-SLP Rehab Admissions Coordinator  [phone] (celll) [phone] (office)

## 2024-09-10 NOTE — Evaluation (Signed)
 Physical Therapy Evaluation Patient Details Name: David King MRN: [id number] DOB: 1956/07/05 Today's Date: 09/10/2024  History of Present Illness  68 y.o. male presents to [facility] 09/08/24 for elective L4-L5 decompression surgery complicated with anaphylactic shock likely 2/2 cephalosporins. Intubated and ICU stay from 9/10-9/11. PMHx: COPD, CAD, PAD, tobacco use, lumbar radiculopathy, detached retina L eye   Clinical Impression  Pt in bed upon arrival and agreeable to PT eval. PTA, pt was ModI with hurrycane. Pt presents with decreased sensation in R foot, decreased R ankle AROM, R>L LE weakness, back pain, decreased activity tolerance, and impaired balance/gait pattern. Educated pt on spinal precautions and how to don/doff brace with assist needed. Pt was able to stand with ModAx2 and use of RW. Pt initially had difficulty stepping with L LE, however, with repetition was able to perform step-pivot transfer and take lateral steps with ModAx2. Pt has 24/7 level of assist available at home. Recommending post-acute rehab >3hrs to maximize rehab potential and decrease caregiver burden. Pt would benefit from acute skilled PT with current functional limitations listed below (see PT Problem List). Acute PT to follow.         If plan is discharge home, recommend the following: A lot of help with walking and/or transfers;A lot of help with bathing/dressing/bathroom;Assistance with cooking/housework;Assist for transportation;Help with stairs or ramp for entrance   Can travel by private vehicle    No    Equipment Recommendations Wheelchair (measurements PT);Wheelchair cushion (measurements PT);BSC/3in1     Functional Status Assessment Patient has had a recent decline in their functional status and demonstrates the ability to make significant improvements in function in a reasonable and predictable amount of time.     Precautions / Restrictions Precautions Precautions: Fall;Back Precaution Booklet  Issued: Yes (comment) Recall of Precautions/Restrictions: Intact Required Braces or Orthoses: Spinal Brace Spinal Brace: Thoracolumbosacral orthotic;Applied in sitting position Restrictions Weight Bearing Restrictions Per Provider Order: No      Mobility  Bed Mobility Overal bed mobility: Needs Assistance Bed Mobility: Rolling, Sidelying to Sit Rolling: Contact guard assist Sidelying to sit: Mod assist, Used rails      General bed mobility comments: able to demonstrate log roll technique with no cueing, ModA to assist with trunk elevation    Transfers Overall transfer level: Needs assistance Equipment used: Rolling walker (2 wheels) Transfers: Sit to/from Stand, Bed to chair/wheelchair/BSC Sit to Stand: Mod assist, +2 physical assistance, +2 safety/equipment   Step pivot transfers: Mod assist, +2 physical assistance, +2 safety/equipment      General transfer comment: ModAx2 for boost-up and steadying assist. Pt initially keeps knees flexed with forward flexed posture. With time and cues, able to correct. Initially had difficulty stepping with LLE with need to weight-shift to offload. Progressed to being able to take steps without having to weight-shift    Ambulation/Gait Ambulation/Gait assistance: Mod assist, +2 physical assistance, +2 safety/equipment Gait Distance (Feet): 4 Feet Assistive device: Rolling walker (2 wheels) Gait Pattern/deviations: Step-through pattern, Decreased stride length, Trunk flexed, Decreased dorsiflexion - right Gait velocity: decr     General Gait Details: took lateral steps with ModAx2 and use of RW. Increased difficulty stepping with LLE     Balance Overall balance assessment: Mild deficits observed, not formally tested        Pertinent Vitals/Pain Pain Assessment Pain Assessment: Faces Faces Pain Scale: Hurts even more Pain Location: low back Pain Descriptors / Indicators: Aching, Discomfort Pain Intervention(s): Limited activity  within patient's tolerance, Monitored during session, Repositioned  Home Living Family/patient expects to be discharged to:: Private residence Living Arrangements: Spouse/significant other Available Help at Discharge: Family;Available 24 hours/day Type of Home: House Home Access: Stairs to enter Entrance Stairs-Rails: Can reach both Entrance Stairs-Number of Steps: 4   Home Layout: One level Home Equipment: Agricultural consultant (2 wheels);Shower seat;Grab bars - tub/shower;Cane - single point;Adaptive equipment;Hand held shower head;Other (comment) (hurry cane)      Prior Function Prior Level of Function : Independent/Modified Independent;Driving        Mobility Comments: mod I with hurry cane ADLs Comments: ind     Extremity/Trunk Assessment   Upper Extremity Assessment Upper Extremity Assessment: Defer to OT evaluation    Lower Extremity Assessment Lower Extremity Assessment: RLE deficits/detail;LLE deficits/detail RLE Deficits / Details: Hip flexion 3/5, Knee ext 4+/5, Limited ankle DF ROM- 3/5 RLE Sensation: decreased light touch (decreased sensation on plantar surface of foot) LLE Deficits / Details: Hip flexion 3/5, Knee ext 4+/5, Ankle DF 4+/5 LLE Sensation: WNL    Cervical / Trunk Assessment Cervical / Trunk Assessment: Back Surgery  Communication   Communication Communication: No apparent difficulties    Cognition Arousal: Alert Behavior During Therapy: WFL for tasks assessed/performed   PT - Cognitive impairments: No apparent impairments    Following commands: Impaired Following commands impaired: Only follows one step commands consistently, Follows multi-step commands inconsistently     Cueing Cueing Techniques: Verbal cues, Tactile cues      PT Assessment Patient needs continued PT services  PT Problem List Decreased strength;Decreased range of motion;Decreased activity tolerance;Decreased balance;Decreased mobility       PT Treatment  Interventions Gait training;DME instruction;Functional mobility training;Therapeutic activities;Therapeutic exercise;Stair training;Balance training;Neuromuscular re-education;Patient/family education    PT Goals (Current goals can be found in the Care Plan section)  Acute Rehab PT Goals Patient Stated Goal: to gain independence with mobility PT Goal Formulation: With patient Time For Goal Achievement: 09/24/24 Potential to Achieve Goals: Good    Frequency Min 3X/week     Co-evaluation   Reason for Co-Treatment: For patient/therapist safety;To address functional/ADL transfers PT goals addressed during session: Mobility/safety with mobility;Balance;Proper use of DME         AM-PAC PT 6 Clicks Mobility  Outcome Measure Help needed turning from your back to your side while in a flat bed without using bedrails?: A Little Help needed moving from lying on your back to sitting on the side of a flat bed without using bedrails?: A Lot Help needed moving to and from a bed to a chair (including a wheelchair)?: Total Help needed standing up from a chair using your arms (e.g., wheelchair or bedside chair)?: Total Help needed to walk in hospital room?: Total Help needed climbing 3-5 steps with a railing? : Total 6 Click Score: 9    End of Session Equipment Utilized During Treatment: Gait belt;Back brace Activity Tolerance: Patient tolerated treatment well Patient left: in chair;with call bell/phone within reach;with chair alarm set Nurse Communication: Mobility status PT Visit Diagnosis: Unsteadiness on feet (R26.81);Other abnormalities of gait and mobility (R26.89);Muscle weakness (generalized) (M62.81);Difficulty in walking, not elsewhere classified (R26.2)    Time: 9055-8971 PT Time Calculation (min) (ACUTE ONLY): 44 min   Charges:   PT Evaluation $PT Eval Moderate Complexity: 1 Mod   PT General Charges $$ ACUTE PT VISIT: 1 Visit       Kate ORN, PT, DPT Secure Chat Preferred   Rehab Office [phone]   Kate BRAVO Wendolyn 09/10/2024, 11:43 AM

## 2024-09-10 NOTE — Progress Notes (Signed)
 Patient transferred to chair from bed. 2 person assist with walker. Patient stated  Once my feet were on the floor and I was sitting up the nerve pain was gone Encouraged patient to ask staff  to help transfer patient to chair as often as possible.

## 2024-09-10 NOTE — Progress Notes (Signed)
    Patient is now transferred to 3 west and out of the ICU Unfortunately, the patient still has not been out of bed Patient complains of pain in his bilateral heels, and numbness in his bilateral feet Patient did have a bowel movement yesterday.  He was able to sense the ability to move his bowels, and he states it was normal bowel movement.   Physical Exam: Vitals:   09/10/24 0005 09/10/24 0730  BP: 134/77 (!) 145/78  Pulse: 95 78  Resp: 18 19  Temp: 98 F (36.7 C) 97.9 F (36.6 C)  SpO2: 95% 100%    Dressing in place Positive sensation in the bilateral thighs and legs, with decreased sensation in the bilateral feet Patient is able to exhibit dorsiflexion and plantarflexion, although this is weak on the right side A rectal examination was performed.  The patient does have full sensation in the perirectal area  POD #3 s/p lumbar decompression and fusion.  As previously documented, the patient did have an anaphylactic reaction intraoperatively. His pressure is now stabilized.  His bilateral foot numbness is quite curious and not readily explained.  His right foot weakness is unexplained as well, particularly since his discectomy and fusion was from the left side.  - up with PT/OT, encourage ambulation - I did speak with patient's nurse, Thais, in order to ensure patient is out of bed into a chair at least twice per day - Percocet for pain, Robaxin  for muscle spasms - Will continue to follow closely -Patient's Foley will be removed.  I did prescribe him Flomax .

## 2024-09-11 LAB — GLUCOSE, CAPILLARY
Glucose-Capillary: 122 mg/dL — ABNORMAL HIGH (ref 70–99)
Glucose-Capillary: 116 mg/dL — ABNORMAL HIGH (ref 70–99)
Glucose-Capillary: 128 mg/dL — ABNORMAL HIGH (ref 70–99)
Glucose-Capillary: 126 mg/dL — ABNORMAL HIGH (ref 70–99)
Glucose-Capillary: 159 mg/dL — ABNORMAL HIGH (ref 70–99)

## 2024-09-11 NOTE — Plan of Care (Signed)
  Problem: Education: Goal: Knowledge of General Education information will improve Description: Including pain rating scale, medication(s)/side effects and non-pharmacologic comfort measures Outcome: Progressing   Problem: Health Behavior/Discharge Planning: Goal: Ability to manage health-related needs will improve Outcome: Progressing   Problem: Clinical Measurements: Goal: Ability to maintain clinical measurements within normal limits will improve Outcome: Progressing Goal: Will remain free from infection Outcome: Progressing Goal: Diagnostic test results will improve Outcome: Progressing Goal: Respiratory complications will improve Outcome: Progressing Goal: Cardiovascular complication will be avoided Outcome: Progressing   Problem: Nutrition: Goal: Adequate nutrition will be maintained Outcome: Progressing   Problem: Coping: Goal: Level of anxiety will decrease Outcome: Progressing   Problem: Elimination: Goal: Will not experience complications related to bowel motility Outcome: Progressing Goal: Will not experience complications related to urinary retention Outcome: Progressing   Problem: Safety: Goal: Ability to remain free from injury will improve Outcome: Progressing   Problem: Skin Integrity: Goal: Risk for impaired skin integrity will decrease Outcome: Progressing   Problem: Education: Goal: Ability to verbalize activity precautions or restrictions will improve Outcome: Progressing Goal: Knowledge of the prescribed therapeutic regimen will improve Outcome: Progressing Goal: Understanding of discharge needs will improve Outcome: Progressing   Problem: Activity: Goal: Ability to avoid complications of mobility impairment will improve Outcome: Progressing Goal: Ability to tolerate increased activity will improve Outcome: Progressing Goal: Will remain free from falls Outcome: Progressing   Problem: Bowel/Gastric: Goal: Gastrointestinal status for  postoperative course will improve Outcome: Progressing   Problem: Clinical Measurements: Goal: Ability to maintain clinical measurements within normal limits will improve Outcome: Progressing Goal: Postoperative complications will be avoided or minimized Outcome: Progressing Goal: Diagnostic test results will improve Outcome: Progressing   Problem: Pain Management: Goal: Pain level will decrease Outcome: Progressing   Problem: Skin Integrity: Goal: Will show signs of wound healing Outcome: Progressing   Problem: Health Behavior/Discharge Planning: Goal: Identification of resources available to assist in meeting health care needs will improve Outcome: Progressing   Problem: Bladder/Genitourinary: Goal: Urinary functional status for postoperative course will improve Outcome: Progressing   Problem: Activity: Goal: Ability to tolerate increased activity will improve Outcome: Progressing   Problem: Respiratory: Goal: Ability to maintain a clear airway and adequate ventilation will improve Outcome: Progressing   Problem: Role Relationship: Goal: Method of communication will improve Outcome: Progressing   Problem: Safety: Goal: Non-violent Restraint(s) Outcome: Progressing   Problem: Education: Goal: Ability to describe self-care measures that may prevent or decrease complications (Diabetes Survival Skills Education) will improve Outcome: Progressing Goal: Individualized Educational Video(s) Outcome: Progressing   Problem: Coping: Goal: Ability to adjust to condition or change in health will improve Outcome: Progressing   Problem: Fluid Volume: Goal: Ability to maintain a balanced intake and output will improve Outcome: Progressing   Problem: Health Behavior/Discharge Planning: Goal: Ability to identify and utilize available resources and services will improve Outcome: Progressing Goal: Ability to manage health-related needs will improve Outcome: Progressing    Problem: Metabolic: Goal: Ability to maintain appropriate glucose levels will improve Outcome: Progressing   Problem: Nutritional: Goal: Maintenance of adequate nutrition will improve Outcome: Progressing Goal: Progress toward achieving an optimal weight will improve Outcome: Progressing   Problem: Skin Integrity: Goal: Risk for impaired skin integrity will decrease Outcome: Progressing   Problem: Tissue Perfusion: Goal: Adequacy of tissue perfusion will improve Outcome: Progressing

## 2024-09-11 NOTE — Progress Notes (Signed)
.  Subjective: 3 Days Post-Op Procedure(s) (LRB): LEFT-SIDED LUMBAR 4- LUMBAR 5 TRANSFORAMINAL LUMBAR INTERBODY FUSION AND DECOMPRESSION WITH INSTRUMENTATION AND ALLOGRAFT (Left)  Patient still complaining of pain and numbness in bilateral feet, worse on the right. Unable to urinate overnight requiring in/out cath x2 and foley placement.  Objective: Vital signs in last 24 hours: Temp:  [97.4 F (36.3 C)-98.4 F (36.9 C)] 98.4 F (36.9 C) (09/13 0800) Pulse Rate:  [90-109] 90 (09/13 0800) Resp:  [17-20] 20 (09/13 0800) BP: (103-154)/(77-94) 154/94 (09/13 0800) SpO2:  [93 %-100 %] 95 % (09/13 0800)  Labs: Recent Labs    09/08/24 1434 09/08/24 1455 09/09/24 0530  HGB 13.8 13.6 12.8*   Recent Labs    09/08/24 1434 09/08/24 1455 09/09/24 0530  WBC 13.0*  --  8.5  RBC 4.18*  --  3.85*  HCT 40.7 40.0 37.7*  PLT 155  --  124*   Recent Labs    09/08/24 1434 09/08/24 1455 09/09/24 0530  NA 136 138 136  K 4.0 4.1 4.0  CL 106  --  103  CO2 22  --  22  BUN 13  --  10  CREATININE 1.03  --  0.98  GLUCOSE 161*  --  134*  CALCIUM 8.3*  --  8.5*   No results for input(s): LABPT, INR in the last 72 hours.  Physical Exam:  Dressing in place Positive sensation in the bilateral thighs and legs, with decreased sensation in the bilateral feet, worse on the right Patient is able to exhibit dorsiflexion and plantarflexion, although this is minimal on the right side  Assessment/Plan:  3 Days Post-Op Procedure(s) (LRB): LEFT-SIDED LUMBAR 4- LUMBAR 5 TRANSFORAMINAL LUMBAR INTERBODY FUSION AND DECOMPRESSION WITH INSTRUMENTATION AND ALLOGRAFT (Left)  As previously documented, the patient did have an anaphylactic reaction intraoperatively. His pressure is now stabilized.  His bilateral foot numbness is quite curious and not readily explained.  His right foot weakness is unexplained as well, particularly since his discectomy and fusion was from the left side.   -PT/OT, encourage  ambulation -Up to chair at least 2x daily -Continue pain control - DC foley tomorrow with NORVELL Adine JAYSON Sherida 09/11/2024, 11:53 AM

## 2024-09-11 NOTE — Plan of Care (Signed)
   Problem: Education: Goal: Knowledge of General Education information will improve Description: Including pain rating scale, medication(s)/side effects and non-pharmacologic comfort measures Outcome: Progressing   Problem: Clinical Measurements: Goal: Ability to maintain clinical measurements within normal limits will improve Outcome: Progressing Goal: Will remain free from infection Outcome: Progressing

## 2024-09-11 NOTE — Progress Notes (Signed)
 Patient with acute urinary retention after foley removal, has had I&O x2. Contacted MD on call as bladder scan shows 763, patient unable to void. Received orders to insert indwelling foley. Inserted foley with Havy, RN.

## 2024-09-12 LAB — GLUCOSE, CAPILLARY
Glucose-Capillary: 107 mg/dL — ABNORMAL HIGH (ref 70–99)
Glucose-Capillary: 109 mg/dL — ABNORMAL HIGH (ref 70–99)
Glucose-Capillary: 110 mg/dL — ABNORMAL HIGH (ref 70–99)
Glucose-Capillary: 112 mg/dL — ABNORMAL HIGH (ref 70–99)
Glucose-Capillary: 113 mg/dL — ABNORMAL HIGH (ref 70–99)
Glucose-Capillary: 117 mg/dL — ABNORMAL HIGH (ref 70–99)

## 2024-09-12 MED ORDER — GABAPENTIN 300 MG PO CAPS
600.0000 mg | ORAL_CAPSULE | Freq: Three times a day (TID) | ORAL | Status: DC
  Administered 2024-09-12 – 2024-09-15 (×10): 600 mg via ORAL
  Filled 2024-09-12 (×10): qty 2

## 2024-09-12 NOTE — Plan of Care (Signed)
  Problem: Education: Goal: Knowledge of General Education information will improve Description: Including pain rating scale, medication(s)/side effects and non-pharmacologic comfort measures Outcome: Progressing   Problem: Health Behavior/Discharge Planning: Goal: Ability to manage health-related needs will improve Outcome: Progressing   Problem: Clinical Measurements: Goal: Ability to maintain clinical measurements within normal limits will improve Outcome: Progressing Goal: Will remain free from infection Outcome: Progressing Goal: Diagnostic test results will improve Outcome: Progressing Goal: Respiratory complications will improve Outcome: Progressing Goal: Cardiovascular complication will be avoided Outcome: Progressing   Problem: Activity: Goal: Risk for activity intolerance will decrease Outcome: Progressing   Problem: Coping: Goal: Level of anxiety will decrease Outcome: Progressing   Problem: Elimination: Goal: Will not experience complications related to bowel motility Outcome: Progressing Goal: Will not experience complications related to urinary retention Outcome: Progressing   Problem: Pain Managment: Goal: General experience of comfort will improve and/or be controlled Outcome: Progressing   Problem: Safety: Goal: Ability to remain free from injury will improve Outcome: Progressing   Problem: Skin Integrity: Goal: Risk for impaired skin integrity will decrease Outcome: Progressing   Problem: Education: Goal: Ability to verbalize activity precautions or restrictions will improve Outcome: Progressing Goal: Knowledge of the prescribed therapeutic regimen will improve Outcome: Progressing Goal: Understanding of discharge needs will improve Outcome: Progressing   Problem: Activity: Goal: Ability to avoid complications of mobility impairment will improve Outcome: Progressing Goal: Ability to tolerate increased activity will improve Outcome:  Progressing Goal: Will remain free from falls Outcome: Progressing   Problem: Bowel/Gastric: Goal: Gastrointestinal status for postoperative course will improve Outcome: Progressing   Problem: Clinical Measurements: Goal: Ability to maintain clinical measurements within normal limits will improve Outcome: Progressing Goal: Postoperative complications will be avoided or minimized Outcome: Progressing Goal: Diagnostic test results will improve Outcome: Progressing   Problem: Skin Integrity: Goal: Will show signs of wound healing Outcome: Progressing   Problem: Health Behavior/Discharge Planning: Goal: Identification of resources available to assist in meeting health care needs will improve Outcome: Progressing   Problem: Bladder/Genitourinary: Goal: Urinary functional status for postoperative course will improve Outcome: Progressing   Problem: Activity: Goal: Ability to tolerate increased activity will improve Outcome: Progressing   Problem: Respiratory: Goal: Ability to maintain a clear airway and adequate ventilation will improve Outcome: Progressing   Problem: Role Relationship: Goal: Method of communication will improve Outcome: Progressing   Problem: Safety: Goal: Non-violent Restraint(s) Outcome: Progressing   Problem: Education: Goal: Ability to describe self-care measures that may prevent or decrease complications (Diabetes Survival Skills Education) will improve Outcome: Progressing Goal: Individualized Educational Video(s) Outcome: Progressing   Problem: Coping: Goal: Ability to adjust to condition or change in health will improve Outcome: Progressing   Problem: Fluid Volume: Goal: Ability to maintain a balanced intake and output will improve Outcome: Progressing   Problem: Health Behavior/Discharge Planning: Goal: Ability to identify and utilize available resources and services will improve Outcome: Progressing Goal: Ability to manage  health-related needs will improve Outcome: Progressing   Problem: Metabolic: Goal: Ability to maintain appropriate glucose levels will improve Outcome: Progressing   Problem: Nutritional: Goal: Maintenance of adequate nutrition will improve Outcome: Progressing Goal: Progress toward achieving an optimal weight will improve Outcome: Progressing   Problem: Skin Integrity: Goal: Risk for impaired skin integrity will decrease Outcome: Progressing   Problem: Tissue Perfusion: Goal: Adequacy of tissue perfusion will improve Outcome: Progressing

## 2024-09-12 NOTE — Progress Notes (Signed)
.  Subjective: 4 Days Post-Op Procedure(s) (LRB): LEFT-SIDED LUMBAR 4- LUMBAR 5 TRANSFORAMINAL LUMBAR INTERBODY FUSION AND DECOMPRESSION WITH INSTRUMENTATION AND ALLOGRAFT (Left)  Patient still complaining of pain and numbness in bilateral feet, worse on the right. No change in reported pain level  Objective: Vital signs in last 24 hours: Temp:  [98 F (36.7 C)-98.5 F (36.9 C)] 98 F (36.7 C) (09/14 0739) Pulse Rate:  [88-106] 95 (09/14 0739) Resp:  [16-20] 16 (09/14 0739) BP: (119-142)/(77-96) 119/82 (09/14 0739) SpO2:  [92 %-100 %] 93 % (09/14 0746)  Labs: No results for input(s): HGB in the last 72 hours. No results for input(s): WBC, RBC, HCT, PLT in the last 72 hours. No results for input(s): NA, K, CL, CO2, BUN, CREATININE, GLUCOSE, CALCIUM in the last 72 hours. No results for input(s): LABPT, INR in the last 72 hours.  Physical Exam:   Dressing in place Positive sensation in the bilateral thighs and legs, with decreased sensation in the bilateral feet, worse on the right Patient is able to exhibit dorsiflexion and plantarflexion, although this is minimal on the right side  Assessment/Plan:  4 Days Post-Op Procedure(s) (LRB): LEFT-SIDED LUMBAR 4- LUMBAR 5 TRANSFORAMINAL LUMBAR INTERBODY FUSION AND DECOMPRESSION WITH INSTRUMENTATION AND ALLOGRAFT (Left) As previously documented, the patient did have an anaphylactic reaction intraoperatively. His pressure is now stabilized.  His bilateral foot numbness is quite curious and not readily explained.  His right foot weakness is unexplained as well, particularly since his discectomy and fusion was from the left side.    -PT/OT, encourage ambulation -Up to chair at least 2x daily -Continue pain control - DC foley for TOV   David King David King 09/12/2024, 11:22 AM

## 2024-09-12 NOTE — PMR Pre-admission (Signed)
 PMR Admission Coordinator Pre-Admission Assessment  Patient: David King is an 68 y.o., male MRN: [id number] DOB: 1956/10/08 Height: 5' 9 (175.3 cm) Weight: 93.3 kg  Insurance Information HMO:  yes   PPO:      PCP:      IPA:      80/20:      OTHER:  PRIMARY: UHC Medicare      Policy#: [id number], Medicare: [id number]     Subscriber: pt CM Name: UM dept      Phone#: [phone], option 8     Fax#:  [phone]  I received auth for CIR from Jon with [facility] for admit 9/16 through 9/22.  Updates due to UM dept  at fax listed above. Pre-Cert#: [id number]       Employer:  Benefits:  Phone #:      Name:  Eustacio Date: 12/31/2023 - still active Deductible: $0 (does not have deductible) OOP Max: $3,800 ($788.11 met)  CIR: $355/day co-pay for days 1-5, $0/day co-pay for days 6+  SNF: $0.00 Copayment per day for days 1-20; $203 Copayment per day for days 21-100 for Medicare-covered care/maximum 100 days/benefit period  Outpatient: $20 copay/visit Home Health:  100% coverage, 0% co-insurance; limited by medical necessity  DME: 80% coverage; 20% co-insurance Providers: in network    SECONDARY:       Policy#:      Phone#:   Artist:       Phone#:   The Data processing manager" for patients in Inpatient Rehabilitation Facilities with attached "Privacy Act Statement-Health Care Records" was provided and verbally reviewed with: Patient  Emergency Contact Information Contact Information     Name Relation Home Work Mobile   [place]) Spouse [phone]  [phone]      Other Contacts   None on File     Current Medical History  Patient Admitting Diagnosis: Lumbar Myelopathy History of Present Illness:  David King is a 68 y.o. male with PMHx of  has a past medical history of Anxiety, Arthritis, Atherosclerotic vascular disease, Cataract, COPD (chronic obstructive pulmonary disease) (HCC), Diverticulosis, Elevated PSA, Headache(784.0),  Hypertension (2021), Iritis, Neuromuscular disorder (HCC), Pain, Peripheral vascular disease (HCC), Prostate cancer (HCC) (03/05/2013), Renal cysts, acquired, bilateral (03/19/2013), and Urinary frequency. . They were admitted to [facility] on 09/08/2024 for lumbar stenosis with left radiculopathy, status post prior L4-5 decompression, presenting for planned TLIF with pedicle screw at L4-5.  Procedure was performed by Dr. Beuford on 9-10, and was complicated by significant hypotension 50s over 20s secondary to anaphylactic reaction to anesthesia, was eventually stabilized after several doses of epinephrine  and epinephrine  infusion, Ortho was able to complete the surgery without further incident.  He remained intubated postop due to concern for angioedema, and was admitted to the ICU.  Patient was extubated 9-11, successfully transition to 4 L nasal cannula.  Of note, had tingling/numbness in his bilateral feet and a right foot drop on awakening, which has not been worked up.  Hospitalization otherwise complicated by constipation, urinary retention with Foley, and for pain control with Percocet and Robaxin .  PM&R was consulted to evaluate appropriateness for IPR admission.     Patient's medical record from [facility] has been reviewed by the rehabilitation admission coordinator and physician.  Past Medical History  Past Medical History:  Diagnosis Date   Anxiety    new dx   Arthritis    lumbar   Atherosclerotic vascular disease    Cataract  COPD (chronic obstructive pulmonary disease) (HCC)    Diverticulosis    Elevated PSA    Headache(784.0)    MIGRAINES   Hypertension 2021   Iritis    CHRONIC IN LEFT EYE - SOME VISIAL IMPAIRMENT IN LEFT EYE   Neuromuscular disorder (HCC)    left leg/foot,pinched siactic nerve   Pain    PAIN LEFT HIP AND DOWN LT LEG WITH NUMBNESS IN LEFT LEG--PT STATES SCIATIC NERVE IMPINGEMENT - PT PLANS BACK IN THE NEAR SURGERY.   Peripheral  vascular disease (HCC)    Prostate cancer (HCC) 03/05/2013   Adenocarcinoma   Renal cysts, acquired, bilateral 03/19/2013   several simple , CT   Urinary frequency    AND NOCTURIA    Has the patient had major surgery during 100 days prior to admission? Yes  Family History   family history includes Cancer in his father and paternal grandfather.  Current Medications  Current Facility-Administered Medications:    acetaminophen  (TYLENOL ) tablet 650 mg, 650 mg, Oral, Q4H PRN, 650 mg at 09/09/24 1540 **OR** acetaminophen  (TYLENOL ) suppository 650 mg, 650 mg, Rectal, Q4H PRN, McKenzie, Kayla J, PA-C   albuterol  (PROVENTIL ) (2.5 MG/3ML) 0.083% nebulizer solution 3 mL, 3 mL, Inhalation, Q6H PRN, McKenzie, Kayla J, PA-C   alum & mag hydroxide-simeth (MAALOX/MYLANTA) 200-200-20 MG/5ML suspension 30 mL, 30 mL, Oral, Q6H PRN, McKenzie, Kayla J, PA-C   arformoterol  (BROVANA ) nebulizer solution 15 mcg, 15 mcg, Nebulization, BID, Bowser, Grace E, NP, 15 mcg at 09/12/24 0746   bisacodyl  (DULCOLAX) EC tablet 5 mg, 5 mg, Oral, Daily PRN, McKenzie, Kayla J, PA-C   budesonide  (PULMICORT ) nebulizer solution 0.5 mg, 0.5 mg, Nebulization, BID, Bowser, Grace E, NP, 0.5 mg at 09/12/24 9256   Chlorhexidine  Gluconate Cloth 2 % PADS 6 each, 6 each, Topical, Daily, Dewald, Jonathan B, MD, 6 each at 09/12/24 1001   docusate sodium  (COLACE) capsule 100 mg, 100 mg, Oral, BID, Paytes, Austin A, RPH, 100 mg at 09/12/24 1001   gabapentin  (NEURONTIN ) capsule 600 mg, 600 mg, Oral, TID, Wham, Adine BROCKS, MD   HYDROcodone -acetaminophen  (NORCO/VICODIN) 5-325 MG per tablet 1 tablet, 1 tablet, Oral, Q6H PRN, Bowser, Ronnald BRAVO, NP, 1 tablet at 09/12/24 0013   insulin  aspart (novoLOG ) injection 0-9 Units, 0-9 Units, Subcutaneous, Q4H, Ogan, Okoronkwo U, MD, 1 Units at 09/11/24 1741   labetalol  (NORMODYNE ) injection 10 mg, 10 mg, Intravenous, Q2H PRN, Ogan, Okoronkwo U, MD   lactose free nutrition (BOOST PLUS) liquid 237 mL, 237 mL,  Oral, TID WC, Dumonski, Oneil, MD, 237 mL at 09/11/24 1742   menthol -cetylpyridinium (CEPACOL) lozenge 3 mg, 1 lozenge, Oral, PRN **OR** phenol (CHLORASEPTIC) mouth spray 1 spray, 1 spray, Mouth/Throat, PRN, McKenzie, Kayla J, PA-C   methocarbamol  (ROBAXIN ) tablet 500-1,000 mg, 500-1,000 mg, Oral, Q6H PRN, 1,000 mg at 09/12/24 0013 **OR** methocarbamol  (ROBAXIN ) injection 500 mg, 500 mg, Intravenous, Q6H PRN, McKenzie, Kayla J, PA-C   ondansetron  (ZOFRAN ) tablet 4 mg, 4 mg, Oral, Q6H PRN **OR** ondansetron  (ZOFRAN ) injection 4 mg, 4 mg, Intravenous, Q6H PRN, McKenzie, Kayla J, PA-C   Oral care mouth rinse, 15 mL, Mouth Rinse, PRN, Bowser, Ronnald BRAVO, NP   Oral care mouth rinse, 15 mL, Mouth Rinse, PRN, Dewald, Jonathan B, MD   Oral care mouth rinse, 15 mL, Mouth Rinse, 4 times per day, Harold Scholz, MD, 15 mL at 09/12/24 0800   Oral care mouth rinse, 15 mL, Mouth Rinse, PRN, Chand, Sudham, MD   oxyCODONE -acetaminophen  (PERCOCET/ROXICET) 5-325 MG per tablet 1-2  tablet, 1-2 tablet, Oral, Q4H PRN, McKenzie, Kayla J, PA-C, 2 tablet at 09/12/24 1216   polyethylene glycol (MIRALAX  / GLYCOLAX ) packet 17 g, 17 g, Oral, Daily, Paytes, Austin A, RPH   pravastatin  (PRAVACHOL ) tablet 20 mg, 20 mg, Oral, Daily, McKenzie, Kayla J, PA-C, 20 mg at 09/12/24 1001   revefenacin  (YUPELRI ) nebulizer solution 175 mcg, 175 mcg, Nebulization, Daily, Bowser, Grace E, NP, 175 mcg at 09/12/24 0745   senna-docusate (Senokot-S) tablet 1 tablet, 1 tablet, Oral, QHS PRN, McKenzie, Kayla J, PA-C   sodium chloride  flush (NS) 0.9 % injection 3 mL, 3 mL, Intravenous, Q12H, McKenzie, Kayla J, PA-C, 3 mL at 09/12/24 1002   sodium chloride  flush (NS) 0.9 % injection 3 mL, 3 mL, Intravenous, PRN, McKenzie, Kayla J, PA-C   sodium phosphate  (FLEET) enema 1 enema, 1 enema, Rectal, Once PRN, McKenzie, Kayla J, PA-C   tamsulosin  (FLOMAX ) capsule 0.4 mg, 0.4 mg, Oral, Daily, Dumonski, Mark, MD, 0.4 mg at 09/12/24 1001   zolpidem  (AMBIEN ) tablet 5  mg, 5 mg, Oral, QHS PRN, McKenzie, Kayla J, PA-C, 5 mg at 09/11/24 2154  Patients Current Diet:  Diet Order             Diet regular Room service appropriate? Yes; Fluid consistency: Thin  Diet effective now                   Precautions / Restrictions Precautions Precautions: Fall, Back Precaution Booklet Issued: Yes (comment) Spinal Brace: Thoracolumbosacral orthotic, Applied in sitting position Restrictions Weight Bearing Restrictions Per Provider Order: (P) No   Has the patient had 2 or more falls or a fall with injury in the past year? No  Prior Activity Level Community (5-7x/wk): Pt. active in the community PTA  Prior Functional Level Self Care: Did the patient need help bathing, dressing, using the toilet or eating? Independent  Indoor Mobility: Did the patient need assistance with walking from room to room (with or without device)? Independent  Stairs: Did the patient need assistance with internal or external stairs (with or without device)? Independent  Functional Cognition: Did the patient need help planning regular tasks such as shopping or remembering to take medications? Independent  Patient Information Are you of Hispanic, Latino/a,or Spanish origin?: A. No, not of Hispanic, Latino/a, or Spanish origin What is your race?: A. White Do you need or want an interpreter to communicate with a doctor or health care staff?: 0. No  Patient's Response To:  Health Literacy and Transportation Is the patient able to respond to health literacy and transportation needs?: Yes Health Literacy - How often do you need to have someone help you when you read instructions, pamphlets, or other written material from your doctor or pharmacy?: Never In the past 12 months, has lack of transportation kept you from medical appointments or from getting medications?: No In the past 12 months, has lack of transportation kept you from meetings, work, or from getting things needed for  daily living?: No  Home Assistive Devices / Equipment Home Equipment: Agricultural consultant (2 wheels), Shower seat, Grab bars - tub/shower, Medical laboratory scientific officer - single point, Adaptive equipment, Hand held shower head, Other (comment) (hurry cane)  Prior Device Use: Indicate devices/aids used by the patient prior to current illness, exacerbation or injury? None of the above  Current Functional Level Cognition  Orientation Level: (P) Oriented X4    Extremity Assessment (includes Sensation/Coordination)  Upper Extremity Assessment: Generalized weakness, RUE deficits/detail, LUE deficits/detail RUE Deficits / Details: weak grip  strength, denies sensation changes. LUE Deficits / Details: weak grip strength, denies sensation changes.  Lower Extremity Assessment: RLE deficits/detail, LLE deficits/detail RLE Deficits / Details: Hip flexion 3/5, Knee ext 4+/5, Limited ankle DF ROM- 3/5 RLE Sensation: decreased light touch (decreased sensation on plantar surface of foot) LLE Deficits / Details: Hip flexion 3/5, Knee ext 4+/5, Ankle DF 4+/5 LLE Sensation: WNL    ADLs  Overall ADL's : Needs assistance/impaired Eating/Feeding: Sitting, Set up Grooming: Sitting, Wash/dry face, Set up Upper Body Bathing: Sitting, Set up Lower Body Bathing: Moderate assistance, Sitting/lateral leans Upper Body Dressing : Sitting, Minimal assistance Lower Body Dressing: Maximal assistance, Sitting/lateral leans Toilet Transfer: Stand-pivot, BSC/3in1, Rolling walker (2 wheels), +2 for physical assistance, +2 for safety/equipment, Moderate assistance Toileting- Clothing Manipulation and Hygiene: Moderate assistance, +2 for safety/equipment Functional mobility during ADLs: +2 for safety/equipment, +2 for physical assistance, Moderate assistance, Rolling walker (2 wheels)    Mobility  Overal bed mobility: Needs Assistance Bed Mobility: Rolling, Sidelying to Sit Rolling: Contact guard assist Sidelying to sit: Mod assist, Used  rails General bed mobility comments: able to demonstrate log roll technique with no cueing, ModA to assist with trunk elevation    Transfers  Overall transfer level: Needs assistance Equipment used: Rolling walker (2 wheels) Transfers: Sit to/from Stand, Bed to chair/wheelchair/BSC Sit to Stand: Mod assist, +2 physical assistance, +2 safety/equipment Bed to/from chair/wheelchair/BSC transfer type:: Step pivot Step pivot transfers: Mod assist, +2 physical assistance, +2 safety/equipment General transfer comment: ModAx2 for boost-up and steadying assist. Pt initially keeps knees flexed with forward flexed posture. With time and cues, able to correct. Initially had difficulty stepping with LLE with need to weight-shift to offload. Progressed to being able to take steps without having to weight-shift    Ambulation / Gait / Stairs / Wheelchair Mobility  Ambulation/Gait Ambulation/Gait assistance: Mod assist, +2 physical assistance, +2 safety/equipment Gait Distance (Feet): 4 Feet Assistive device: Rolling walker (2 wheels) Gait Pattern/deviations: Step-through pattern, Decreased stride length, Trunk flexed, Decreased dorsiflexion - right General Gait Details: took lateral steps with ModAx2 and use of RW. Increased difficulty stepping with LLE Gait velocity: decr    Posture / Balance Balance Overall balance assessment: Mild deficits observed, not formally tested    Special considerations/life events  Skin surgical incision and Special service needs TSLO brace   Previous Home Environment (from acute therapy documentation) Living Arrangements: Spouse/significant other  Lives With: Spouse Available Help at Discharge: Family, Available 24 hours/day Type of Home: House Home Layout: One level Home Access: Stairs to enter Entrance Stairs-Rails: Can reach both Entrance Stairs-Number of Steps: 4 Bathroom Shower/Tub: Health visitor: Handicapped height Bathroom Accessibility:  Yes How Accessible: Accessible via walker  Discharge Living Setting Plans for Discharge Living Setting: Patient's home Type of Home at Discharge: House Discharge Home Layout: One level Discharge Home Access: Stairs to enter Entrance Stairs-Rails: Can reach both Entrance Stairs-Number of Steps: 4 Discharge Bathroom Shower/Tub: Walk-in shower Discharge Bathroom Toilet: Handicapped height Discharge Bathroom Accessibility: Yes How Accessible: Accessible via walker  Social/Family/Support Systems Patient Roles: Spouse Contact Information: Bailen Geffre Anticipated Caregiver: [phone] Ability/Limitations of Caregiver: 24/7 min A Caregiver Availability: 24/7 Discharge Plan Discussed with Primary Caregiver: Yes Is Caregiver In Agreement with Plan?: Yes Does Caregiver/Family have Issues with Lodging/Transportation while Pt is in Rehab?: No  Goals Patient/Family Goal for Rehab: PT/OT supervision Expected length of stay: 10-14 days Pt/Family Agrees to Admission and willing to participate: Yes Program Orientation Provided & Reviewed with Pt/Caregiver Including  Roles  & Responsibilities: Yes  Decrease burden of Care through IP rehab admission: Not anticipated  Possible need for SNF placement upon discharge: not anticipated  Patient Condition: This patient's medical and functional status has changed since the consult dated: 08/30/24 in which the Rehabilitation Physician determined and documented that the patient's condition is appropriate for intensive rehabilitative care in an inpatient rehabilitation facility. See History of Present Illness (above) for medical update. Functional changes are: Pt. Min-mod A. Patient's medical and functional status update has been discussed with the Rehabilitation physician and patient remains appropriate for inpatient rehabilitation. Will admit to inpatient rehab today.  Preadmission Screen Completed By:  Leita KATHEE Kleine, 09/12/2024 2:19  PM ______________________________________________________________________   Discussed status with Dr. Lorilee on 09/15/24 at 1039 and received approval for admission today.  Admission Coordinator:  Leita KATHEE Kleine, CCC-SLP, time 1039/Date 09/15/24

## 2024-09-13 ENCOUNTER — Inpatient Hospital Stay (HOSPITAL_COMMUNITY)

## 2024-09-13 DIAGNOSIS — G061 Intraspinal abscess and granuloma: Secondary | ICD-10-CM

## 2024-09-13 DIAGNOSIS — M4804 Spinal stenosis, thoracic region: Secondary | ICD-10-CM | POA: Diagnosis not present

## 2024-09-13 DIAGNOSIS — M4854XA Collapsed vertebra, not elsewhere classified, thoracic region, initial encounter for fracture: Secondary | ICD-10-CM | POA: Diagnosis not present

## 2024-09-13 DIAGNOSIS — S3421XA Injury of nerve root of lumbar spine, initial encounter: Secondary | ICD-10-CM

## 2024-09-13 DIAGNOSIS — I639 Cerebral infarction, unspecified: Secondary | ICD-10-CM | POA: Diagnosis not present

## 2024-09-13 DIAGNOSIS — Z981 Arthrodesis status: Secondary | ICD-10-CM | POA: Diagnosis not present

## 2024-09-13 DIAGNOSIS — R29890 Loss of height: Secondary | ICD-10-CM | POA: Diagnosis not present

## 2024-09-13 DIAGNOSIS — M47814 Spondylosis without myelopathy or radiculopathy, thoracic region: Secondary | ICD-10-CM | POA: Diagnosis not present

## 2024-09-13 LAB — GLUCOSE, CAPILLARY
Glucose-Capillary: 111 mg/dL — ABNORMAL HIGH (ref 70–99)
Glucose-Capillary: 111 mg/dL — ABNORMAL HIGH (ref 70–99)
Glucose-Capillary: 149 mg/dL — ABNORMAL HIGH (ref 70–99)
Glucose-Capillary: 149 mg/dL — ABNORMAL HIGH (ref 70–99)

## 2024-09-13 MED ORDER — GADOBUTROL 1 MMOL/ML IV SOLN
9.0000 mL | Freq: Once | INTRAVENOUS | Status: AC | PRN
  Administered 2024-09-13: 9 mL via INTRAVENOUS

## 2024-09-13 MED ORDER — DEXAMETHASONE SODIUM PHOSPHATE 10 MG/ML IJ SOLN
10.0000 mg | Freq: Three times a day (TID) | INTRAMUSCULAR | Status: DC
  Administered 2024-09-13 – 2024-09-15 (×6): 10 mg via INTRAVENOUS
  Filled 2024-09-13 (×6): qty 1

## 2024-09-13 NOTE — Consult Note (Signed)
 NEUROLOGY CONSULT NOTE   Date of service: September 13, 2024 Patient Name: David King MRN:  [id number] DOB:  [birth year] Chief Complaint: Lower  Requesting Provider: Beuford Anes, MD  History of Present Illness  David King is a 68 y.o. male with hx of anxiety, COPD, headaches, HTN, PVD, prostate cancer who is s/p TLIF with pedicle screw fixation L4/5 on 9/10 and his operative case was complicated by a possible anaphylaxis   with hypotension, tongue swelling and turned red, and was subsequently put  on an epi gtt and was given decadron , pepcid  and benadryl . During the case he was weaned off the epi gtt, left intubated and was sent to the ICU. He was extubated on 9/11. He had MRI T spine with no acute findings. He was unable to tolerate MRI L spine due to pain and inability to lay flat.  Since surgery he has had ongoing right leg weakness and numbness. Neurology consulted   Patient is laying in bed in NAD. He states prior to surgery he was having left leg pain and weakness. Since the surgery he states he has been having right leg weakness, can not walk, can not wiggle his toes, his right bottom foot is numb and barely has any feeling and his right leg is very sensitive to touch, with right leg swelling  On exam he is awake and alert, oriented, bilateral uppers 5/5, left leg 4+/5, right leg unable to lift right heel off the bed but can bend knee and pull foot up the bed, left knee flexion 4/5, left knee extension 4/5, bilateral hip abduction 4/5,  bilateral hip adduction 4/5, left hip flexors 4+/5, right hip flexor 3/5, right foot plantar flexion 1/5, right dorsiflexion 1/5, can very slightly wiggle right toes, left dorsiflexion/plantar flexion 4/5. Decreased sensation on bottom of right foot, and severe allodynia of right calf   ROS   Comprehensive ROS performed and pertinent positives documented in HPI    Past History   Past Medical History:  Diagnosis Date   Anxiety    new dx    Arthritis    lumbar   Atherosclerotic vascular disease    Cataract    COPD (chronic obstructive pulmonary disease) (HCC)    Diverticulosis    Elevated PSA    Headache(784.0)    MIGRAINES   Hypertension 2021   Iritis    CHRONIC IN LEFT EYE - SOME VISIAL IMPAIRMENT IN LEFT EYE   Neuromuscular disorder (HCC)    left leg/foot,pinched siactic nerve   Pain    PAIN LEFT HIP AND DOWN LT LEG WITH NUMBNESS IN LEFT LEG--PT STATES SCIATIC NERVE IMPINGEMENT - PT PLANS BACK IN THE NEAR SURGERY.   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   Renal cysts, acquired, bilateral 03/19/2013   several simple , CT   Urinary frequency    AND NOCTURIA    Past Surgical History:  Procedure Laterality Date   BACK SURGERY     CARDIAC CATHETERIZATION  03/10/2018   CATARACT EXTRACTION W/ INTRAOCULAR LENS IMPLANT Left    EYE SURGERY     RETINAL SURGERY LEFT EYE   HYDROCELE EXCISION Left 03/05/2013   Procedure: HYDROCELECTOMY ADULT;  Surgeon: Donnice Gwenyth Brooks, MD;  Location: [facility];  Service: Urology;  Laterality: Left;   INCISIONAL HERNIA REPAIR N/A 06/18/2022   Procedure: REPAIR OF INTERNAL HERNIA;  Surgeon: Rubin Calamity, MD;  Location: [facility];  Service: General;  Laterality:  N/A;   INGUINAL HERNIA REPAIR Bilateral AGE 80   INSERTION OF MESH N/A 06/11/2022   Procedure: INSERTION OF MESH;  Surgeon: Rubin Calamity, MD;  Location: [facility] OR;  Service: General;  Laterality: N/A;   INSERTION OF MESH N/A 06/18/2022   Procedure: INSERTION OF MESH;  Surgeon: Rubin Calamity, MD;  Location: [facility] OR;  Service: General;  Laterality: N/A;   KYPHOPLASTY N/A 05/08/2023   Procedure: THORACIC SEVEN KYPHOPLASTY;  Surgeon: Beuford Anes, MD;  Location: [facility] OR;  Service: Orthopedics;  Laterality: N/A;   LAPAROSCOPY N/A 06/18/2022   Procedure: LAPAROSCOPY DIAGNOSTIC;  Surgeon: Rubin Calamity, MD;  Location: [facility] OR;  Service: General;  Laterality: N/A;    LYMPHADENECTOMY Bilateral 05/06/2013   Procedure: REDGIE;  Surgeon: Noretta Ferrara, MD;  Location: [facility];  Service: Urology;  Laterality: Bilateral;   PROSTATE BIOPSY N/A 03/05/2013   Procedure: PROSTATE BIOPSY and ultrasound;  Surgeon: Donnice Gwenyth Brooks, MD;  Location: [facility];  Service: Urology;  Laterality: N/A;   REMOVAL BURSA SAC, LEFT ELBOW  1996   ROBOT ASSISTED LAPAROSCOPIC RADICAL PROSTATECTOMY N/A 05/06/2013   Procedure: ROBOTIC ASSISTED LAPAROSCOPIC RADICAL PROSTATECTOMY LEVEL 3;  Surgeon: Noretta Ferrara, MD;  Location: [facility];  Service: Urology;  Laterality: N/A;   TONSILLECTOMY     as achild   TRANSFORAMINAL LUMBAR INTERBODY FUSION (TLIF) WITH PEDICLE SCREW FIXATION 1 LEVEL Left 09/08/2024   Procedure: LEFT-SIDED LUMBAR 4- LUMBAR 5 TRANSFORAMINAL LUMBAR INTERBODY FUSION AND DECOMPRESSION WITH INSTRUMENTATION AND ALLOGRAFT;  Surgeon: Beuford Anes, MD;  Location: [facility] OR;  Service: Orthopedics;  Laterality: Left;  LEFT-SIDED LUMBAR 4- LUMBAR 5 TRANSFORAMINAL LUMBAR INTERBODY FUSION AND DECOMPRESSION WITH INSTRUMENTATION AND ALLOGRAFT   XI ROBOTIC ASSISTED VENTRAL HERNIA N/A 06/11/2022   Procedure: ROBOTIC INCISIONAL HERNIA REPAIR WITH MESH;  Surgeon: Rubin Calamity, MD;  Location: [facility] OR;  Service: General;  Laterality: N/A;    Family History: Family History  Problem Relation Age of Onset   Cancer Father        prostate cancer   Cancer Paternal Grandfather        prostate   Colon cancer Neg Hx    Colon polyps Neg Hx    Esophageal cancer Neg Hx    Rectal cancer Neg Hx    Stomach cancer Neg Hx     Social History  reports that he quit smoking about 7 years ago. His smoking use included cigarettes. He started smoking about 54 years ago. He has a 94 pack-year smoking history. He has never used smokeless tobacco. He reports current alcohol use of about 14.0 standard drinks of alcohol per week. He reports that he does not use drugs.  Allergies   Allergen Reactions   Ancef  [Cefazolin ] Anaphylaxis    Medications   Current Facility-Administered Medications:    acetaminophen  (TYLENOL ) tablet 650 mg, 650 mg, Oral, Q4H PRN, 650 mg at 09/09/24 1540 **OR** acetaminophen  (TYLENOL ) suppository 650 mg, 650 mg, Rectal, Q4H PRN, McKenzie, Kayla J, PA-C   albuterol  (PROVENTIL ) (2.5 MG/3ML) 0.083% nebulizer solution 3 mL, 3 mL, Inhalation, Q6H PRN, McKenzie, Kayla J, PA-C   alum & mag hydroxide-simeth (MAALOX/MYLANTA) 200-200-20 MG/5ML suspension 30 mL, 30 mL, Oral, Q6H PRN, McKenzie, Kayla J, PA-C   arformoterol  (BROVANA ) nebulizer solution 15 mcg, 15 mcg, Nebulization, BID, Bowser, Grace E, NP, 15 mcg at 09/13/24 0827   bisacodyl  (DULCOLAX) EC tablet 5 mg, 5 mg, Oral, Daily PRN, McKenzie, Kayla J, PA-C   budesonide  (PULMICORT ) nebulizer solution 0.5 mg, 0.5 mg, Nebulization,  BID, Bowser, Grace E, NP, 0.5 mg at 09/13/24 9171   Chlorhexidine  Gluconate Cloth 2 % PADS 6 each, 6 each, Topical, Daily, Kara Dorn NOVAK, MD, 6 each at 09/12/24 1001   dexamethasone  (DECADRON ) injection 10 mg, 10 mg, Intravenous, Q8H, Dumonski, Oneil, MD, 10 mg at 09/13/24 1508   docusate sodium  (COLACE) capsule 100 mg, 100 mg, Oral, BID, Paytes, Austin A, RPH, 100 mg at 09/13/24 1109   gabapentin  (NEURONTIN ) capsule 600 mg, 600 mg, Oral, TID, Sherida Adine BROCKS, MD, 600 mg at 09/13/24 1109   HYDROcodone -acetaminophen  (NORCO/VICODIN) 5-325 MG per tablet 1 tablet, 1 tablet, Oral, Q6H PRN, Daren Ronnald BRAVO, NP, 1 tablet at 09/12/24 2040   insulin  aspart (novoLOG ) injection 0-9 Units, 0-9 Units, Subcutaneous, Q4H, Ogan, Okoronkwo U, MD, 1 Units at 09/11/24 1741   labetalol  (NORMODYNE ) injection 10 mg, 10 mg, Intravenous, Q2H PRN, Ogan, Okoronkwo U, MD   lactose free nutrition (BOOST PLUS) liquid 237 mL, 237 mL, Oral, TID WC, Dumonski, Oneil, MD, 237 mL at 09/12/24 1635   menthol -cetylpyridinium (CEPACOL) lozenge 3 mg, 1 lozenge, Oral, PRN **OR** phenol (CHLORASEPTIC) mouth spray 1  spray, 1 spray, Mouth/Throat, PRN, McKenzie, Kayla J, PA-C   methocarbamol  (ROBAXIN ) tablet 500-1,000 mg, 500-1,000 mg, Oral, Q6H PRN, 500 mg at 09/13/24 1107 **OR** methocarbamol  (ROBAXIN ) injection 500 mg, 500 mg, Intravenous, Q6H PRN, McKenzie, Kayla J, PA-C   ondansetron  (ZOFRAN ) tablet 4 mg, 4 mg, Oral, Q6H PRN **OR** ondansetron  (ZOFRAN ) injection 4 mg, 4 mg, Intravenous, Q6H PRN, McKenzie, Kayla J, PA-C   Oral care mouth rinse, 15 mL, Mouth Rinse, PRN, Bowser, Ronnald BRAVO, NP   Oral care mouth rinse, 15 mL, Mouth Rinse, PRN, Dewald, Jonathan B, MD   Oral care mouth rinse, 15 mL, Mouth Rinse, 4 times per day, Harold Scholz, MD, 15 mL at 09/13/24 1110   Oral care mouth rinse, 15 mL, Mouth Rinse, PRN, Chand, Sudham, MD   oxyCODONE -acetaminophen  (PERCOCET/ROXICET) 5-325 MG per tablet 1-2 tablet, 1-2 tablet, Oral, Q4H PRN, McKenzie, Kayla J, PA-C, 1 tablet at 09/13/24 1508   polyethylene glycol (MIRALAX  / GLYCOLAX ) packet 17 g, 17 g, Oral, Daily, Paytes, Austin A, RPH   pravastatin  (PRAVACHOL ) tablet 20 mg, 20 mg, Oral, Daily, McKenzie, Kayla J, PA-C, 20 mg at 09/13/24 1110   revefenacin  (YUPELRI ) nebulizer solution 175 mcg, 175 mcg, Nebulization, Daily, Bowser, Grace E, NP, 175 mcg at 09/13/24 0827   senna-docusate (Senokot-S) tablet 1 tablet, 1 tablet, Oral, QHS PRN, McKenzie, Kayla J, PA-C   sodium chloride  flush (NS) 0.9 % injection 3 mL, 3 mL, Intravenous, Q12H, McKenzie, Kayla J, PA-C, 3 mL at 09/13/24 1110   sodium chloride  flush (NS) 0.9 % injection 3 mL, 3 mL, Intravenous, PRN, McKenzie, Kayla J, PA-C   sodium phosphate  (FLEET) enema 1 enema, 1 enema, Rectal, Once PRN, McKenzie, Kayla J, PA-C   tamsulosin  (FLOMAX ) capsule 0.4 mg, 0.4 mg, Oral, Daily, Dumonski, Mark, MD, 0.4 mg at 09/13/24 1110   zolpidem  (AMBIEN ) tablet 5 mg, 5 mg, Oral, QHS PRN, McKenzie, Kayla J, PA-C, 5 mg at 09/11/24 2154  Vitals   Vitals:   09/13/24 0827 09/13/24 0828 09/13/24 1149 09/13/24 1628  BP:   131/77 124/82   Pulse:   86 95  Resp:   18 18  Temp:   98.1 F (36.7 C) 98.2 F (36.8 C)  TempSrc:   Oral Oral  SpO2: 96% 97% 96% 94%  Weight:      Height:  Body mass index is 30.38 kg/m.   Physical Exam   Constitutional: Appears well-developed and well-nourished.  Psych: Affect appropriate to situation.  Eyes: No scleral injection.  HENT: No OP obstruction.  Head: Normocephalic.  Cardiovascular: Normal rate and regular rhythm.  Respiratory: Effort normal, non-labored breathing.  GI: Soft.  No distension. There is no tenderness.  Skin: WDI.   Neurologic Examination   Mental Status -  Level of arousal and orientation to time, place, and person were intact. Language including expression, naming, repetition, comprehension was assessed and found intact. Attention span and concentration were normal. Recent and remote memory were intact. Fund of Knowledge was assessed and was intact.  Cranial Nerves II - XII - II - Visual field intact OU. III, IV, VI - Extraocular movements intact. V - Facial sensation intact bilaterally. VII - Facial movement intact bilaterally. VIII - Hearing & vestibular intact bilaterally. X - Palate elevates symmetrically. XI - Chin turning & shoulder shrug intact bilaterally. XII - Tongue protrusion intact.  Motor Strength - bilateral uppers 5/5, left leg 4+/5, right leg unable to lift right heel off the bed but can bend knee and pull foot up the bed, left knee flexion 4/5, left knee extension 4/5, bilateral hip abduction 4/5,  bilateral hip adduction 4/5, left hip flexors 4+/5, right hip flexor 3/5, right foot plantar flexion 1/5, right dorsiflexion 1/5, can very slightly wiggle right toes, left dorsiflexion/plantar flexion 4/5. Sensory - Decreased sensation on bottom of right foot, and severe allodynia of right calf Coordination - no ataxia in uppers   Gait and Station - deferred.  Labs/Imaging/Neurodiagnostic studies   CBC:  Recent Labs  Lab  09/08/24 1434 09/08/24 1455 09/09/24 0530  WBC 13.0*  --  8.5  HGB 13.8 13.6 12.8*  HCT 40.7 40.0 37.7*  MCV 97.4  --  97.9  PLT 155  --  124*   Basic Metabolic Panel:  Lab Results  Component Value Date   NA 136 09/09/2024   K 4.0 09/09/2024   CO2 22 09/09/2024   GLUCOSE 134 (H) 09/09/2024   BUN 10 09/09/2024   CREATININE 0.98 09/09/2024   CALCIUM 8.5 (L) 09/09/2024   GFRNONAA >60 09/09/2024   GFRAA 84 10/10/2020   Lipid Panel:  Lab Results  Component Value Date   LDLCALC 143 (H) 10/10/2020   HgbA1c:  Lab Results  Component Value Date   HGBA1C 5.2 09/09/2024   INR  Lab Results  Component Value Date   INR 1.2 08/22/2022     MRI Thoracic Spine  1. No acute findings. 2. 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 3. Mild anterior wedging of the T11 and T12 vertebral bodies without edema, which may be physiologic versus sequelae of mild chronic compression deformity. 4. Degenerative changes as above. Mild foraminal stenosis on the left at T6-7 and moderate right and mild left foraminal stenosis at T10-11.   ASSESSMENT   David King is a 68 y.o. male who is s/p TLIF with pedicle screw fixation L4/5 on 9/10 and his operative case was complicated by a possible anaphylaxis   with hypotension, tongue swelling and turned red, and was subsequently put  on an epi gtt and was given decadron , pepcid  and benadryl . During the case he was weaned off the epi gtt, left intubated and was sent to the ICU. Since surgery he has had ongoing right leg weakness and numbness.  Unclear etiology.  Localization somewhere between L2/L3 to  S1 levels. Possibilities include postsurgical fluid collections compressing the nerve roots versus abscess versus hematoma.  Other differentials such as stroke should be evaluated.  Thankfully there is no evidence of spinal cord infarct on thoracic spine MRI  RECOMMENDATIONS  MRI L spine when able MRI  brain PT/OT as able  ______________________________________________________________________    Signed, Karna DELENA Geralds, NP [facility]   Attending Neurohospitalist Addendum Patient seen and examined with APP/Resident. Agree with the history and physical as documented above. Agree with the plan as documented, which I helped formulate. I have independently reviewed the chart, obtained history, review of systems and examined the patient.I have personally reviewed pertinent head/neck/spine imaging (CT/MRI).   Only addition, I was able to elicit at least 1+ knee jerks. Still the weakness localizes somewhere between L2-S1. MRI of the L-spine would be good but also would be beneficial to get MRI of the brain to make sure there was no stroke that caused this since he was hypotensive during the procedure. Reports some improvement with Decadron  that was since given in the morning. Will follow-up Plan relayed to attending orthopedic surgeon.  Please feel free to call with any questions.  -- Eligio Lav, MD Neurologist [facility] Pager: [phone]

## 2024-09-13 NOTE — Progress Notes (Signed)
 Inpatient Rehab Admissions Coordinator:    CIR following. Await decision from insurance. I will update once I hear.   Leita Kleine, MS, CCC-SLP Rehab Admissions Coordinator  [phone] (celll) [phone] (office)

## 2024-09-13 NOTE — Progress Notes (Signed)
 Patient evaluated at his bedside.  He continues to have pain primarily in his right foot and right shin.  Patient has no pain in his back.Curiously, he states that this pain is entirely alleviated when he sits up in a chair. He does continue to have 1 out of 5 strength to EHL and dorsiflexion on the right side, with 1 out of 5 plantar flexion on the right as well.  I did let him know that I do have concerns about spinal cord ischemia from his hypotension, from his anaphylactic reaction.  I did call and discuss this possibility with neurology, Dr. Arora.  He does agree with proceeding with a thoracic and lumbar MRI.  The patient is currently in the scanner. Will review his MRIs once they are complete and offer additional recommendations as indicated per his MRI.

## 2024-09-13 NOTE — Progress Notes (Signed)
 Occupational Therapy Treatment Patient Details Name: David King MRN: [id number] DOB: [birth year] Today's Date: 09/13/2024   History of present illness 68 y.o. male presents to [facility] 09/08/24 for elective L4-L5 decompression surgery complicated with anaphylactic shock likely 2/2 cephalosporins. Intubated and ICU stay from 9/10-9/11. PMHx: COPD, CAD, PAD, tobacco use, lumbar radiculopathy, detached retina L eye   OT comments  Pt supine in bed and agreeable to participate in OT/PT session.  Pt reports relief for R LE/back pain in sitting.  Transitions to EOB with contact guard assist, sitting EOB engaged in ADLs with setup for grooming and total assist for LB Adls, min assist for donning brace/UB Adls.  Pt stood at EOB with min-mod assist +2, steps to [facility] with min assist +2 using RW.  Pt dizzy with prolonged upright sitting, increasing in standing.  See below for BP (orthostatic).  Pt voices frustration with situation.  Continue to recommend >3hrs/day inpatient setting at dc.  Will follow acutely.   BP sitting EOB: 113/78 Standing: 99/72 Sitting: 115/75 Sitting after 2nd standing trial and lateral steps: 106/79      If plan is discharge home, recommend the following:  A lot of help with walking and/or transfers;A lot of help with bathing/dressing/bathroom;Assistance with cooking/housework;Assist for transportation   Equipment Recommendations  BSC/3in1;Other (comment) (RW)    Recommendations for Other Services Rehab consult    Precautions / Restrictions Precautions Precautions: Fall;Back Precaution Booklet Issued: Yes (comment) Recall of Precautions/Restrictions: Intact Precaution/Restrictions Comments: watch BP (orthostatic) Required Braces or Orthoses: Spinal Brace Spinal Brace: Thoracolumbosacral orthotic;Applied in sitting position Restrictions Weight Bearing Restrictions Per Provider Order: No       Mobility Bed Mobility Overal bed mobility: Needs Assistance Bed Mobility:  Rolling, Sidelying to Sit, Sit to Sidelying Rolling: Contact guard assist Sidelying to sit: Used rails, Contact guard assist     Sit to sidelying: Min assist, Used rails, HOB elevated General bed mobility comments: increased time, cueing for technique to follow back precautions.  min assist for R LE back to sidelyine    Transfers Overall transfer level: Needs assistance Equipment used: Rolling walker (2 wheels) Transfers: Sit to/from Stand Sit to Stand: Mod assist, +2 physical assistance, +2 safety/equipment, Min assist     Step pivot transfers: Min assist, +2 physical assistance     General transfer comment: STS from EOB x2 with mod A +2 progressing to min A +2 for power up. Pt able to take lateral steps along EOB with assist for RW management and initially for RLE advancement. R knee flexed and pt reports pain with assist to extend     Balance Overall balance assessment: Needs assistance Sitting-balance support: Bilateral upper extremity supported, No upper extremity supported, Feet supported Sitting balance-Leahy Scale: Fair Sitting balance - Comments: sitting EOB   Standing balance support: Bilateral upper extremity supported, During functional activity, Reliant on assistive device for balance Standing balance-Leahy Scale: Poor Standing balance comment: reliant on RW                           ADL either performed or assessed with clinical judgement   ADL Overall ADL's : Needs assistance/impaired     Grooming: Sitting;Set up;Oral care           Upper Body Dressing : Sitting;Minimal assistance   Lower Body Dressing: Total assistance;+2 for physical assistance;Sit to/from stand   Toilet Transfer: Moderate assistance;+2 for physical assistance;Ambulation;Rolling walker (2 wheels) Toilet Transfer Details (indicate cue type  and reason): simulated side stepping at EOB         Functional mobility during ADLs: Moderate assistance;Minimal assistance;+2 for  physical assistance      Extremity/Trunk Assessment              Vision   Additional Comments: L eye blurry vision, R eye normal   Perception     Praxis     Communication Communication Communication: No apparent difficulties   Cognition Arousal: Alert Behavior During Therapy: WFL for tasks assessed/performed Cognition: No apparent impairments             OT - Cognition Comments: labile at times, frustrated with situation                 Following commands: Impaired Following commands impaired: Follows one step commands with increased time, Follows multi-step commands inconsistently      Cueing   Cueing Techniques: Verbal cues, Tactile cues  Exercises      Shoulder Instructions       General Comments + orthostatic at EOB    Pertinent Vitals/ Pain       Pain Assessment Pain Assessment: Faces Faces Pain Scale: Hurts whole lot Pain Location: low back, RLE Pain Descriptors / Indicators: Aching, Discomfort Pain Intervention(s): Limited activity within patient's tolerance, Monitored during session, Repositioned  Home Living                                          Prior Functioning/Environment              Frequency  Min 2X/week        Progress Toward Goals  OT Goals(current goals can now be found in the care plan section)  Progress towards OT goals: Progressing toward goals  Acute Rehab OT Goals Patient Stated Goal: get better OT Goal Formulation: With patient Time For Goal Achievement: 09/24/24 Potential to Achieve Goals: Good  Plan      Co-evaluation    PT/OT/SLP Co-Evaluation/Treatment: Yes Reason for Co-Treatment: For patient/therapist safety;To address functional/ADL transfers PT goals addressed during session: Mobility/safety with mobility;Balance;Proper use of DME OT goals addressed during session: ADL's and self-care      AM-PAC OT 6 Clicks Daily Activity     Outcome Measure   Help from  another person eating meals?: A Little Help from another person taking care of personal grooming?: A Little Help from another person toileting, which includes using toliet, bedpan, or urinal?: A Lot Help from another person bathing (including washing, rinsing, drying)?: A Lot Help from another person to put on and taking off regular upper body clothing?: A Little Help from another person to put on and taking off regular lower body clothing?: A Lot 6 Click Score: 15    End of Session Equipment Utilized During Treatment: Gait belt;Rolling walker (2 wheels);Back brace  OT Visit Diagnosis: Unsteadiness on feet (R26.81);Other abnormalities of gait and mobility (R26.89);Muscle weakness (generalized) (M62.81);Pain Pain - Right/Left: Right Pain - part of body: Ankle and joints of foot   Activity Tolerance Patient tolerated treatment well   Patient Left in chair;with call bell/phone within reach;with chair alarm set   Nurse Communication Mobility status;Precautions        Time: 9049-8976 OT Time Calculation (min): 33 min  Charges: OT General Charges $OT Visit: 1 Visit OT Treatments $Self Care/Home Management : 8-22 mins  Etta NOVAK, OT Acute Rehabilitation  Services Office [phone] Secure Chat Preferred    Etta GORMAN [place] 09/13/2024, 11:29 AM

## 2024-09-13 NOTE — Plan of Care (Signed)
 Problem: Education: Goal: Knowledge of General Education information will improve Description: Including pain rating scale, medication(s)/side effects and non-pharmacologic comfort measures Outcome: Progressing   Problem: Health Behavior/Discharge Planning: Goal: Ability to manage health-related needs will improve Outcome: Progressing   Problem: Clinical Measurements: Goal: Ability to maintain clinical measurements within normal limits will improve Outcome: Progressing Goal: Will remain free from infection Outcome: Progressing Goal: Diagnostic test results will improve Outcome: Progressing Goal: Respiratory complications will improve Outcome: Progressing Goal: Cardiovascular complication will be avoided Outcome: Progressing   Problem: Activity: Goal: Risk for activity intolerance will decrease Outcome: Progressing   Problem: Nutrition: Goal: Adequate nutrition will be maintained Outcome: Progressing   Problem: Coping: Goal: Level of anxiety will decrease Outcome: Progressing   Problem: Elimination: Goal: Will not experience complications related to bowel motility Outcome: Progressing Goal: Will not experience complications related to urinary retention Outcome: Progressing   Problem: Pain Managment: Goal: General experience of comfort will improve and/or be controlled Outcome: Progressing   Problem: Safety: Goal: Ability to remain free from injury will improve Outcome: Progressing   Problem: Skin Integrity: Goal: Risk for impaired skin integrity will decrease Outcome: Progressing   Problem: Education: Goal: Ability to verbalize activity precautions or restrictions will improve Outcome: Progressing Goal: Knowledge of the prescribed therapeutic regimen will improve Outcome: Progressing Goal: Understanding of discharge needs will improve Outcome: Progressing   Problem: Activity: Goal: Ability to avoid complications of mobility impairment will  improve Outcome: Progressing Goal: Ability to tolerate increased activity will improve Outcome: Progressing Goal: Will remain free from falls Outcome: Progressing   Problem: Bowel/Gastric: Goal: Gastrointestinal status for postoperative course will improve Outcome: Progressing   Problem: Clinical Measurements: Goal: Ability to maintain clinical measurements within normal limits will improve Outcome: Progressing Goal: Postoperative complications will be avoided or minimized Outcome: Progressing Goal: Diagnostic test results will improve Outcome: Progressing   Problem: Pain Management: Goal: Pain level will decrease Outcome: Progressing   Problem: Skin Integrity: Goal: Will show signs of wound healing Outcome: Progressing   Problem: Health Behavior/Discharge Planning: Goal: Identification of resources available to assist in meeting health care needs will improve Outcome: Progressing   Problem: Bladder/Genitourinary: Goal: Urinary functional status for postoperative course will improve Outcome: Progressing   Problem: Activity: Goal: Ability to tolerate increased activity will improve Outcome: Progressing   Problem: Respiratory: Goal: Ability to maintain a clear airway and adequate ventilation will improve Outcome: Progressing   Problem: Role Relationship: Goal: Method of communication will improve Outcome: Progressing   Problem: Safety: Goal: Non-violent Restraint(s) Outcome: Progressing   Problem: Education: Goal: Ability to describe self-care measures that may prevent or decrease complications (Diabetes Survival Skills Education) will improve Outcome: Progressing Goal: Individualized Educational Video(s) Outcome: Progressing   Problem: Coping: Goal: Ability to adjust to condition or change in health will improve Outcome: Progressing   Problem: Fluid Volume: Goal: Ability to maintain a balanced intake and output will improve Outcome: Progressing    Problem: Health Behavior/Discharge Planning: Goal: Ability to identify and utilize available resources and services will improve Outcome: Progressing Goal: Ability to manage health-related needs will improve Outcome: Progressing   Problem: Metabolic: Goal: Ability to maintain appropriate glucose levels will improve Outcome: Progressing   Problem: Nutritional: Goal: Maintenance of adequate nutrition will improve Outcome: Progressing Goal: Progress toward achieving an optimal weight will improve Outcome: Progressing   Problem: Skin Integrity: Goal: Risk for impaired skin integrity will decrease Outcome: Progressing   Problem: Tissue Perfusion: Goal: Adequacy of tissue perfusion will improve Outcome:  Progressing

## 2024-09-13 NOTE — Care Management Important Message (Signed)
 Important Message  Patient Details  Name: David King MRN: [id number] Date of Birth: [birth year]   Important Message Given:  Yes - Medicare IM     Claretta Deed 09/13/2024, 4:08 PM

## 2024-09-13 NOTE — Progress Notes (Signed)
  Just spoke with patient's nurse,Ty.  Patient continues to be unable to avoid on his own.  He continues to complain of pain in his right foot and shin. He has no pain in his back.  He continue to have weakness in his right foot. He has been stating that his leg pain is improved with sitting up in a chair.  Patient's constellation of symptoms continue to be curious.  I do wonder as to whether he may have sustained an ischemic injury to the thoracic spine. His rectal examination was normal postoperatively.  At this point, would like to proceed with an MRI of his thoracic and lumbar spine with contrast to see if we can get some sense as to the origin of his symptoms.

## 2024-09-13 NOTE — Progress Notes (Signed)
 Called and spoke with wife to share with her my thoughts as outlined in my note from earlier today.

## 2024-09-13 NOTE — Progress Notes (Signed)
 Physical Therapy Treatment Patient Details Name: David King MRN: [id number] DOB: Apr 01, 1956 Today's Date: 09/13/2024   History of Present Illness 68 y.o. male presents to [facility] 09/08/24 for elective L4-L5 decompression surgery complicated with anaphylactic shock likely 2/2 cephalosporins. Intubated and ICU stay from 9/10-9/11. PMHx: COPD, CAD, PAD, tobacco use, lumbar radiculopathy, detached retina L eye    PT Comments  Pt received in supine and agreeable to session. Pt reports improved pain sitting EOB, however has symptomatic orthostatic hypotension. Pt able to tolerate 2 stands and lateral steps towards [facility] with reports of increased RLE pain with knee extension and touch. Pt limited by pain and low BP. Pt continues to benefit from PT services to progress toward functional mobility goals.    BP sitting EOB: 113/78 Standing: 99/72 Sitting: 115/75 Sitting after 2nd standing trial and lateral steps: 106/79   If plan is discharge home, recommend the following: A lot of help with walking and/or transfers;A lot of help with bathing/dressing/bathroom;Assistance with cooking/housework;Assist for transportation;Help with stairs or ramp for entrance   Can travel by private vehicle        Equipment Recommendations  Wheelchair (measurements PT);Wheelchair cushion (measurements PT);BSC/3in1    Recommendations for Other Services       Precautions / Restrictions Precautions Precautions: Fall;Back Precaution/Restrictions Comments: watch BP (orthostatic) Required Braces or Orthoses: Spinal Brace Spinal Brace: Thoracolumbosacral orthotic;Applied in sitting position Restrictions Weight Bearing Restrictions Per Provider Order: No     Mobility  Bed Mobility Overal bed mobility: Needs Assistance Bed Mobility: Rolling, Sidelying to Sit Rolling: Contact guard assist Sidelying to sit: Used rails, Contact guard assist       General bed mobility comments: increased time, cues for technique     Transfers Overall transfer level: Needs assistance Equipment used: Rolling walker (2 wheels) Transfers: Sit to/from Stand Sit to Stand: Mod assist, +2 physical assistance, +2 safety/equipment, Min assist   Step pivot transfers: Min assist, +2 physical assistance       General transfer comment: STS from EOB x2 with mod A +2 progressing to min A +2 for power up. Pt able to take lateral steps along EOB with assist for RW management and initially for RLE advancement. R knee flexed and pt reports pain with assist to extend    Ambulation/Gait                   Stairs             Wheelchair Mobility     Tilt Bed    Modified Rankin (Stroke Patients Only)       Balance Overall balance assessment: Needs assistance Sitting-balance support: Bilateral upper extremity supported, No upper extremity supported, Feet supported Sitting balance-Leahy Scale: Fair Sitting balance - Comments: sitting EOB   Standing balance support: Bilateral upper extremity supported, During functional activity, Reliant on assistive device for balance Standing balance-Leahy Scale: Poor Standing balance comment: reliant on RW                            Communication Communication Communication: No apparent difficulties  Cognition Arousal: Alert Behavior During Therapy: WFL for tasks assessed/performed   PT - Cognitive impairments: No apparent impairments                         Following commands: Impaired Following commands impaired: Only follows one step commands consistently, Follows multi-step commands inconsistently    Cueing Cueing  Techniques: Verbal cues, Tactile cues  Exercises      General Comments General comments (skin integrity, edema, etc.): Pt reports dizziness with position changes and BP is orthostatic      Pertinent Vitals/Pain Pain Assessment Pain Assessment: Faces Faces Pain Scale: Hurts whole lot Pain Location: low back, RLE Pain  Descriptors / Indicators: Aching, Discomfort Pain Intervention(s): Limited activity within patient's tolerance, Monitored during session, Repositioned     PT Goals (current goals can now be found in the care plan section) Acute Rehab PT Goals Patient Stated Goal: to gain independence with mobility PT Goal Formulation: With patient Time For Goal Achievement: 09/24/24 Progress towards PT goals: Progressing toward goals    Frequency    Min 3X/week      PT Plan      Co-evaluation PT/OT/SLP Co-Evaluation/Treatment: Yes Reason for Co-Treatment: For patient/therapist safety;To address functional/ADL transfers PT goals addressed during session: Mobility/safety with mobility;Balance;Proper use of DME        AM-PAC PT 6 Clicks Mobility   Outcome Measure  Help needed turning from your back to your side while in a flat bed without using bedrails?: A Little Help needed moving from lying on your back to sitting on the side of a flat bed without using bedrails?: A Little Help needed moving to and from a bed to a chair (including a wheelchair)?: Total Help needed standing up from a chair using your arms (e.g., wheelchair or bedside chair)?: A Lot Help needed to walk in hospital room?: Total Help needed climbing 3-5 steps with a railing? : Total 6 Click Score: 11    End of Session Equipment Utilized During Treatment: Gait belt;Back brace Activity Tolerance: Patient tolerated treatment well Patient left: in bed;with call bell/phone within reach Nurse Communication: Mobility status PT Visit Diagnosis: Unsteadiness on feet (R26.81);Other abnormalities of gait and mobility (R26.89);Muscle weakness (generalized) (M62.81);Difficulty in walking, not elsewhere classified (R26.2)     Time: 9049-8976 PT Time Calculation (min) (ACUTE ONLY): 33 min  Charges:    $Therapeutic Activity: 8-22 mins PT General Charges $$ ACUTE PT VISIT: 1 Visit                     Darryle George, PTA Acute  Rehabilitation Services Secure Chat Preferred  [phone]    Darryle George 09/13/2024, 11:01 AM

## 2024-09-14 ENCOUNTER — Inpatient Hospital Stay (HOSPITAL_COMMUNITY)

## 2024-09-14 DIAGNOSIS — R200 Anesthesia of skin: Secondary | ICD-10-CM

## 2024-09-14 DIAGNOSIS — R531 Weakness: Secondary | ICD-10-CM

## 2024-09-14 DIAGNOSIS — R208 Other disturbances of skin sensation: Secondary | ICD-10-CM | POA: Diagnosis not present

## 2024-09-14 DIAGNOSIS — R29898 Other symptoms and signs involving the musculoskeletal system: Secondary | ICD-10-CM

## 2024-09-14 DIAGNOSIS — Z981 Arthrodesis status: Secondary | ICD-10-CM | POA: Diagnosis not present

## 2024-09-14 NOTE — Progress Notes (Signed)
 Physical Therapy Treatment Patient Details Name: David King MRN: [id number] DOB: 03/10/1956 Today's Date: 09/14/2024   History of Present Illness 68 y.o. male presents to [facility] 09/08/24 for elective L4-L5 decompression surgery complicated with anaphylactic shock likely 2/2 cephalosporins. Intubated and ICU stay from 9/10-9/11. PMHx: COPD, CAD, PAD, tobacco use, lumbar radiculopathy, detached retina L eye    PT Comments  Pt received in supine and agreeable to session. Pt reports improved pain today and demonstrates increased activity tolerance. Pt able to tolerate multiple standing trials and pivot to the recliner with up to mod A. Pt continues to demonstrate RLE weakness and no dorsiflexion. Pt reports increased dizziness in sitting and standing, but no significant BP drop noted this session. Pt continues to benefit from PT services to progress toward functional mobility goals.   BP Sitting EOB: 151/88       Standing: 157/82       Sitting in recliner: 139/86   If plan is discharge home, recommend the following: A lot of help with walking and/or transfers;A lot of help with bathing/dressing/bathroom;Assistance with cooking/housework;Assist for transportation;Help with stairs or ramp for entrance   Can travel by private vehicle        Equipment Recommendations  Wheelchair (measurements PT);Wheelchair cushion (measurements PT);BSC/3in1    Recommendations for Other Services       Precautions / Restrictions Precautions Precautions: Fall;Back Recall of Precautions/Restrictions: Intact Precaution/Restrictions Comments: watch BP (orthostatic) Required Braces or Orthoses: Spinal Brace Spinal Brace: Thoracolumbosacral orthotic;Applied in sitting position Restrictions Weight Bearing Restrictions Per Provider Order: No     Mobility  Bed Mobility Overal bed mobility: Needs Assistance Bed Mobility: Sidelying to Sit   Sidelying to sit: Used rails, Contact guard assist       General bed  mobility comments: increased time    Transfers Overall transfer level: Needs assistance Equipment used: Rolling walker (2 wheels) Transfers: Sit to/from Stand, Bed to chair/wheelchair/BSC Sit to Stand: Mod assist   Step pivot transfers: Min assist       General transfer comment: STS from EOB and recliner with mod A for power up and anterior weight shift. Cues for hand placement. Slow steps with pt sliding RLE due to foot drop and weakness    Ambulation/Gait               General Gait Details: unable to progress due to dizziness   Stairs             Wheelchair Mobility     Tilt Bed    Modified Rankin (Stroke Patients Only)       Balance Overall balance assessment: Needs assistance Sitting-balance support: Bilateral upper extremity supported, No upper extremity supported, Feet supported Sitting balance-Leahy Scale: Fair Sitting balance - Comments: sitting EOB   Standing balance support: Bilateral upper extremity supported, During functional activity, Reliant on assistive device for balance Standing balance-Leahy Scale: Poor Standing balance comment: reliant on RW                            Communication Communication Communication: No apparent difficulties  Cognition Arousal: Alert Behavior During Therapy: WFL for tasks assessed/performed   PT - Cognitive impairments: No apparent impairments                         Following commands: Impaired Following commands impaired: Follows one step commands with increased time, Follows multi-step commands inconsistently    Cueing  Cueing Techniques: Verbal cues, Tactile cues  Exercises      General Comments General comments (skin integrity, edema, etc.): Pt reports dizziness with position changes, but no BP drop noted      Pertinent Vitals/Pain Pain Assessment Pain Assessment: Faces Faces Pain Scale: Hurts even more Pain Location: low back, RLE Pain Descriptors / Indicators:  Aching, Discomfort, Sore Pain Intervention(s): Limited activity within patient's tolerance, Monitored during session, Repositioned     PT Goals (current goals can now be found in the care plan section) Acute Rehab PT Goals Patient Stated Goal: to gain independence with mobility PT Goal Formulation: With patient Time For Goal Achievement: 09/24/24 Progress towards PT goals: Progressing toward goals    Frequency    Min 3X/week       AM-PAC PT 6 Clicks Mobility   Outcome Measure  Help needed turning from your back to your side while in a flat bed without using bedrails?: A Little Help needed moving from lying on your back to sitting on the side of a flat bed without using bedrails?: A Little Help needed moving to and from a bed to a chair (including a wheelchair)?: A Lot Help needed standing up from a chair using your arms (e.g., wheelchair or bedside chair)?: A Lot Help needed to walk in hospital room?: Total Help needed climbing 3-5 steps with a railing? : Total 6 Click Score: 12    End of Session Equipment Utilized During Treatment: Gait belt;Back brace Activity Tolerance: Patient limited by fatigue;Patient tolerated treatment well;Other (comment) (dizziness) Patient left: in bed;with call bell/phone within reach Nurse Communication: Mobility status PT Visit Diagnosis: Unsteadiness on feet (R26.81);Other abnormalities of gait and mobility (R26.89);Muscle weakness (generalized) (M62.81);Difficulty in walking, not elsewhere classified (R26.2)     Time: 8886-8856 PT Time Calculation (min) (ACUTE ONLY): 30 min  Charges:    $Therapeutic Activity: 23-37 mins PT General Charges $$ ACUTE PT VISIT: 1 Visit                     Darryle George, PTA Acute Rehabilitation Services Secure Chat Preferred  [phone]    Darryle George 09/14/2024, 12:41 PM

## 2024-09-14 NOTE — Progress Notes (Signed)
    Patient much improved with decadron . His appetite has returned and he is voiding on his own without difficulty at this venture. Mild improvement in right foot pain and hypersensitivity. He is eager to progress to CIR and has been cleared to by neurology which he is pleased about. Awaiting insurance auth. Denies LBP   Physical Exam: Vitals:   09/13/24 1628 09/13/24 1941  BP: 124/82 132/76  Pulse: 95 96  Resp: 18 19  Temp: 98.2 F (36.8 C) 98.1 F (36.7 C)  SpO2: 94% 93%    Sitting up in chair with TLSO brace on eating dinner. Appears comfortable. Prominent hypersensitivity R foot and ankle. 2+ DPP + BIL.PFLX and DFLX 2/5 RLE  POD #6 s/p left lumbar decompression and fusion complicated by anaphylactic reaction with hypotension. We are very reassured by his exam today as he has improved energy/appetite and returned bladder function. Pain is mildly improved in RLE. Minimal PO LBP  - Pt cleared by us  and neurology for CIR transfer and pt eager to proceed with this - From the standpoint of his lumbar fusion he is doing excellent with improved LLE symptoms and minimal well controlled PO LBP - R foot and calf pain and weakness, unknown etiology at this venture with negative neurology workup, unexplained from surgical procedure and benign PO MRI imaging thoracic/lumbar/brain. Neurology with concerns for hypersensitivity vs non-organic origins  - Bladder function has returned and pt urinating with no difficulty and strong stream. - Continue decadron  through tomorrow and then can D/C at discretion of CIR team  - up with PT/OT, encourage ambulation  -TLSO brace at all time when OOB/upright with straps UNDER the arms  - f/u in office 2 weeks PO

## 2024-09-14 NOTE — Plan of Care (Signed)
 Down for MRI today. OOB to chair.     Problem: Education: Goal: Knowledge of General Education information will improve Description: Including pain rating scale, medication(s)/side effects and non-pharmacologic comfort measures Outcome: Progressing   Problem: Activity: Goal: Risk for activity intolerance will decrease Outcome: Progressing   Problem: Safety: Goal: Ability to remain free from injury will improve Outcome: Progressing   Problem: Skin Integrity: Goal: Risk for impaired skin integrity will decrease Outcome: Progressing

## 2024-09-14 NOTE — Progress Notes (Addendum)
 NEUROLOGY CONSULT FOLLOW UP NOTE   Date of service: September 14, 2024 Patient Name: David King MRN:  [id number] DOB:  11-28-1956  Interval Hx/subjective  Seen and examined Right leg strength appears to be better subjectively and also objectively on exam today. L-spine MRI completed-no concerning findings-see full read below  Vitals   Vitals:   09/13/24 0828 09/13/24 1149 09/13/24 1628 09/13/24 1941  BP:  131/77 124/82 132/76  Pulse:  86 95 96  Resp:  18 18 19   Temp:  98.1 F (36.7 C) 98.2 F (36.8 C) 98.1 F (36.7 C)  TempSrc:  Oral Oral Oral  SpO2: 97% 96% 94% 93%  Weight:      Height:         Body mass index is 30.38 kg/m.  Physical Exam  General: Awake alert in no distress HEENT: Normocephalic/atraumatic Lungs: Clear Cardiovascular: Regular rhythm Neurological exam Awake alert oriented x 3 No dysarthria No aphasia Cranial nerves: Pupils equal round react light, has left ptosis that is baseline, no restriction in extraocular movements, visual fields full, face appears symmetric, facial sensation intact, tongue and palate midline. Motor examination with antigravity full strength in the upper extremities bilaterally.  Lower extremity examination today reveals right hip flexors at least 4 -/5 with some component of pain and effort colluding the exam, knee extension on the right is also 4/5 with coaching, plantarflexion and dorsiflexion are weak 2/5.  Left lower extremity is much stronger again with some limitation from pain but at least 4+/5 all over with dorsi and plantarflexion are being 5/5. Sensation is intact to light touch in upper extremities.  Sensation in the right lower extremity is diminished on the right sole and has allodynia with excessive grimacing and groaning with minimal touch on the right lateral aspect of the calf. Coordination examination reveals no dysmetria  Medications  Current Facility-Administered Medications:    acetaminophen  (TYLENOL )  tablet 650 mg, 650 mg, Oral, Q4H PRN, 650 mg at 09/09/24 1540 **OR** acetaminophen  (TYLENOL ) suppository 650 mg, 650 mg, Rectal, Q4H PRN, McKenzie, Kayla J, PA-C   albuterol  (PROVENTIL ) (2.5 MG/3ML) 0.083% nebulizer solution 3 mL, 3 mL, Inhalation, Q6H PRN, McKenzie, Kayla J, PA-C   alum & mag hydroxide-simeth (MAALOX/MYLANTA) 200-200-20 MG/5ML suspension 30 mL, 30 mL, Oral, Q6H PRN, McKenzie, Kayla J, PA-C   arformoterol  (BROVANA ) nebulizer solution 15 mcg, 15 mcg, Nebulization, BID, Bowser, Grace E, NP, 15 mcg at 09/13/24 0827   bisacodyl  (DULCOLAX) EC tablet 5 mg, 5 mg, Oral, Daily PRN, McKenzie, Kayla J, PA-C   budesonide  (PULMICORT ) nebulizer solution 0.5 mg, 0.5 mg, Nebulization, BID, Bowser, Grace E, NP, 0.5 mg at 09/13/24 9171   Chlorhexidine  Gluconate Cloth 2 % PADS 6 each, 6 each, Topical, Daily, Dewald, Jonathan B, MD, 6 each at 09/12/24 1001   dexamethasone  (DECADRON ) injection 10 mg, 10 mg, Intravenous, Q8H, Dumonski, Mark, MD, 10 mg at 09/14/24 0800   docusate sodium  (COLACE) capsule 100 mg, 100 mg, Oral, BID, Paytes, Austin A, RPH, 100 mg at 09/13/24 1109   gabapentin  (NEURONTIN ) capsule 600 mg, 600 mg, Oral, TID, Sherida Adine BROCKS, MD, 600 mg at 09/13/24 2239   HYDROcodone -acetaminophen  (NORCO/VICODIN) 5-325 MG per tablet 1 tablet, 1 tablet, Oral, Q6H PRN, Bowser, Ronnald BRAVO, NP, 1 tablet at 09/12/24 2040   insulin  aspart (novoLOG ) injection 0-9 Units, 0-9 Units, Subcutaneous, Q4H, Ogan, Okoronkwo U, MD, 1 Units at 09/11/24 1741   labetalol  (NORMODYNE ) injection 10 mg, 10 mg, Intravenous, Q2H PRN, Ogan, Okoronkwo U, MD  lactose free nutrition (BOOST PLUS) liquid 237 mL, 237 mL, Oral, TID WC, Dumonski, Oneil, MD, 237 mL at 09/12/24 1635   menthol -cetylpyridinium (CEPACOL) lozenge 3 mg, 1 lozenge, Oral, PRN **OR** phenol (CHLORASEPTIC) mouth spray 1 spray, 1 spray, Mouth/Throat, PRN, McKenzie, Kayla J, PA-C   methocarbamol  (ROBAXIN ) tablet 500-1,000 mg, 500-1,000 mg, Oral, Q6H PRN, 1,000 mg  at 09/13/24 2235 **OR** methocarbamol  (ROBAXIN ) injection 500 mg, 500 mg, Intravenous, Q6H PRN, McKenzie, Kayla J, PA-C   ondansetron  (ZOFRAN ) tablet 4 mg, 4 mg, Oral, Q6H PRN **OR** ondansetron  (ZOFRAN ) injection 4 mg, 4 mg, Intravenous, Q6H PRN, McKenzie, Kayla J, PA-C   Oral care mouth rinse, 15 mL, Mouth Rinse, PRN, Bowser, Ronnald BRAVO, NP   Oral care mouth rinse, 15 mL, Mouth Rinse, PRN, Kara Dorn NOVAK, MD   Oral care mouth rinse, 15 mL, Mouth Rinse, 4 times per day, Harold Scholz, MD, 15 mL at 09/13/24 1700   Oral care mouth rinse, 15 mL, Mouth Rinse, PRN, Harold Scholz, MD   oxyCODONE -acetaminophen  (PERCOCET/ROXICET) 5-325 MG per tablet 1-2 tablet, 1-2 tablet, Oral, Q4H PRN, McKenzie, Kayla J, PA-C, 2 tablet at 09/14/24 0815   polyethylene glycol (MIRALAX  / GLYCOLAX ) packet 17 g, 17 g, Oral, Daily, Paytes, Austin A, RPH   pravastatin  (PRAVACHOL ) tablet 20 mg, 20 mg, Oral, Daily, McKenzie, Kayla J, PA-C, 20 mg at 09/13/24 1110   revefenacin  (YUPELRI ) nebulizer solution 175 mcg, 175 mcg, Nebulization, Daily, Bowser, Ronnald BRAVO, NP, 175 mcg at 09/13/24 0827   senna-docusate (Senokot-S) tablet 1 tablet, 1 tablet, Oral, QHS PRN, McKenzie, Kayla J, PA-C   sodium chloride  flush (NS) 0.9 % injection 3 mL, 3 mL, Intravenous, Q12H, McKenzie, Kayla J, PA-C, 3 mL at 09/13/24 1110   sodium chloride  flush (NS) 0.9 % injection 3 mL, 3 mL, Intravenous, PRN, McKenzie, Kayla J, PA-C   sodium phosphate  (FLEET) enema 1 enema, 1 enema, Rectal, Once PRN, McKenzie, Kayla J, PA-C   tamsulosin  (FLOMAX ) capsule 0.4 mg, 0.4 mg, Oral, Daily, Dumonski, Mark, MD, 0.4 mg at 09/13/24 1110   zolpidem  (AMBIEN ) tablet 5 mg, 5 mg, Oral, QHS PRN, McKenzie, Kayla J, PA-C, 5 mg at 09/14/24 0004  Labs and Diagnostic Imaging   CBC:  Recent Labs  Lab 09/08/24 1434 09/08/24 1455 09/09/24 0530  WBC 13.0*  --  8.5  HGB 13.8 13.6 12.8*  HCT 40.7 40.0 37.7*  MCV 97.4  --  97.9  PLT 155  --  124*    Basic Metabolic Panel:  Lab  Results  Component Value Date   NA 136 09/09/2024   K 4.0 09/09/2024   CO2 22 09/09/2024   GLUCOSE 134 (H) 09/09/2024   BUN 10 09/09/2024   CREATININE 0.98 09/09/2024   CALCIUM 8.5 (L) 09/09/2024   GFRNONAA >60 09/09/2024   GFRAA 84 10/10/2020   Lipid Panel:  Lab Results  Component Value Date   LDLCALC 143 (H) 10/10/2020   HgbA1c:  Lab Results  Component Value Date   HGBA1C 5.2 09/09/2024   INR  Lab Results  Component Value Date   INR 1.2 08/22/2022  MRI of the thoracic spine IMPRESSION: 1. No acute findings. 2. 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 3. Mild anterior wedging of the T11 and T12 vertebral bodies without edema, which may be physiologic versus sequelae of mild chronic compression deformity. 4. Degenerative changes as above. Mild foraminal stenosis on the left at T6-7 and moderate right and mild left foraminal  stenosis at T10-11.  MRI of the lumbar spine with and without contrast IMPRESSION: 1. At L4-L5,PLIF with metallic artifact that limits assessment but there appears to be improved left foraminal stenosis with probably at least moderate residual foraminal stenosis. 2. At L5-S1, similar moderate left and mild right foraminal stenosis.  MR brain-no acute findings  Assessment   JAREL CUADRA is a 68 y.o. male postop day 6 status post lumbar decompression and fusion, complicated by anaphylactic reaction during the procedure with hypotension, who has since been complaining of right foot weakness and right foot numbness as well as allodynia in the right calf. His exam unfortunately does not fit very clearly to a myotomal or dermatomal distribution. Some of his exam findings could be consistent either with just hypersensitivity to pain or some component of nonorganic findings. He would benefit from extensive rehabilitation. Spine imaging-thoracic and lumbar as well as brain imaging are unremarkable for acute  process including cord infarction or stroke, which is reassuring   Recommendations  Therapy assessments and continuing therapy Outpatient follow-up with orthopedics If numbness of the foot persist, consider EMG/nerve conduction studies in about 6 weeks-in that case, referred to [facility] neurology, Dr. Tonita Blanch or Dr. Venetia HHill-referral has been placed in the chart. Plan discussed with Dr. Beuford ______________________________________________________________________   Signed, Eligio Lav, MD [facility]

## 2024-09-15 ENCOUNTER — Other Ambulatory Visit: Payer: Self-pay

## 2024-09-15 ENCOUNTER — Encounter (HOSPITAL_COMMUNITY): Payer: Self-pay | Admitting: Physical Medicine and Rehabilitation

## 2024-09-15 ENCOUNTER — Inpatient Hospital Stay (HOSPITAL_COMMUNITY)
Admission: AD | Admit: 2024-09-15 | Discharge: 2024-09-24 | DRG: 940 | Disposition: A | Discharge status: Other Acute Inpatient Hospital | Source: Intra-hospital | Attending: Physical Medicine and Rehabilitation | Admitting: Physical Medicine and Rehabilitation

## 2024-09-15 DIAGNOSIS — K5900 Constipation, unspecified: Secondary | ICD-10-CM | POA: Diagnosis not present

## 2024-09-15 DIAGNOSIS — N289 Disorder of kidney and ureter, unspecified: Secondary | ICD-10-CM | POA: Diagnosis not present

## 2024-09-15 DIAGNOSIS — Z79899 Other long term (current) drug therapy: Secondary | ICD-10-CM

## 2024-09-15 DIAGNOSIS — I70203 Unspecified atherosclerosis of native arteries of extremities, bilateral legs: Secondary | ICD-10-CM | POA: Diagnosis present

## 2024-09-15 DIAGNOSIS — I2510 Atherosclerotic heart disease of native coronary artery without angina pectoris: Secondary | ICD-10-CM | POA: Diagnosis present

## 2024-09-15 DIAGNOSIS — Z8546 Personal history of malignant neoplasm of prostate: Secondary | ICD-10-CM

## 2024-09-15 DIAGNOSIS — M79661 Pain in right lower leg: Secondary | ICD-10-CM | POA: Diagnosis not present

## 2024-09-15 DIAGNOSIS — T782XXD Anaphylactic shock, unspecified, subsequent encounter: Secondary | ICD-10-CM

## 2024-09-15 DIAGNOSIS — R7401 Elevation of levels of liver transaminase levels: Secondary | ICD-10-CM | POA: Diagnosis present

## 2024-09-15 DIAGNOSIS — K7689 Other specified diseases of liver: Secondary | ICD-10-CM | POA: Diagnosis not present

## 2024-09-15 DIAGNOSIS — I70201 Unspecified atherosclerosis of native arteries of extremities, right leg: Secondary | ICD-10-CM | POA: Diagnosis not present

## 2024-09-15 DIAGNOSIS — Z981 Arthrodesis status: Secondary | ICD-10-CM | POA: Diagnosis not present

## 2024-09-15 DIAGNOSIS — Z48811 Encounter for surgical aftercare following surgery on the nervous system: Principal | ICD-10-CM

## 2024-09-15 DIAGNOSIS — Z9842 Cataract extraction status, left eye: Secondary | ICD-10-CM

## 2024-09-15 DIAGNOSIS — F419 Anxiety disorder, unspecified: Secondary | ICD-10-CM | POA: Diagnosis present

## 2024-09-15 DIAGNOSIS — I743 Embolism and thrombosis of arteries of the lower extremities: Secondary | ICD-10-CM | POA: Diagnosis present

## 2024-09-15 DIAGNOSIS — M7989 Other specified soft tissue disorders: Secondary | ICD-10-CM | POA: Diagnosis not present

## 2024-09-15 DIAGNOSIS — L89316 Pressure-induced deep tissue damage of right buttock: Secondary | ICD-10-CM | POA: Diagnosis present

## 2024-09-15 DIAGNOSIS — I10 Essential (primary) hypertension: Secondary | ICD-10-CM | POA: Diagnosis present

## 2024-09-15 DIAGNOSIS — Z95820 Peripheral vascular angioplasty status with implants and grafts: Secondary | ICD-10-CM | POA: Diagnosis not present

## 2024-09-15 DIAGNOSIS — M79604 Pain in right leg: Secondary | ICD-10-CM | POA: Diagnosis not present

## 2024-09-15 DIAGNOSIS — L89326 Pressure-induced deep tissue damage of left buttock: Secondary | ICD-10-CM | POA: Diagnosis present

## 2024-09-15 DIAGNOSIS — I7409 Other arterial embolism and thrombosis of abdominal aorta: Secondary | ICD-10-CM | POA: Diagnosis present

## 2024-09-15 DIAGNOSIS — L89616 Pressure-induced deep tissue damage of right heel: Secondary | ICD-10-CM | POA: Diagnosis present

## 2024-09-15 DIAGNOSIS — Z8669 Personal history of other diseases of the nervous system and sense organs: Secondary | ICD-10-CM

## 2024-09-15 DIAGNOSIS — M25579 Pain in unspecified ankle and joints of unspecified foot: Secondary | ICD-10-CM | POA: Diagnosis not present

## 2024-09-15 DIAGNOSIS — D72829 Elevated white blood cell count, unspecified: Secondary | ICD-10-CM | POA: Diagnosis not present

## 2024-09-15 DIAGNOSIS — Z87891 Personal history of nicotine dependence: Secondary | ICD-10-CM

## 2024-09-15 DIAGNOSIS — R339 Retention of urine, unspecified: Secondary | ICD-10-CM | POA: Diagnosis present

## 2024-09-15 DIAGNOSIS — M5416 Radiculopathy, lumbar region: Secondary | ICD-10-CM | POA: Diagnosis not present

## 2024-09-15 DIAGNOSIS — Z961 Presence of intraocular lens: Secondary | ICD-10-CM | POA: Diagnosis present

## 2024-09-15 DIAGNOSIS — I998 Other disorder of circulatory system: Secondary | ICD-10-CM | POA: Diagnosis not present

## 2024-09-15 DIAGNOSIS — J449 Chronic obstructive pulmonary disease, unspecified: Secondary | ICD-10-CM | POA: Diagnosis present

## 2024-09-15 DIAGNOSIS — R000 Tachycardia, unspecified: Secondary | ICD-10-CM | POA: Diagnosis present

## 2024-09-15 DIAGNOSIS — E785 Hyperlipidemia, unspecified: Secondary | ICD-10-CM | POA: Diagnosis present

## 2024-09-15 DIAGNOSIS — Z9079 Acquired absence of other genital organ(s): Secondary | ICD-10-CM | POA: Diagnosis not present

## 2024-09-15 DIAGNOSIS — R748 Abnormal levels of other serum enzymes: Secondary | ICD-10-CM | POA: Diagnosis not present

## 2024-09-15 DIAGNOSIS — I714 Abdominal aortic aneurysm, without rupture, unspecified: Secondary | ICD-10-CM | POA: Diagnosis not present

## 2024-09-15 DIAGNOSIS — K7460 Unspecified cirrhosis of liver: Secondary | ICD-10-CM | POA: Diagnosis not present

## 2024-09-15 DIAGNOSIS — Y838 Other surgical procedures as the cause of abnormal reaction of the patient, or of later complication, without mention of misadventure at the time of the procedure: Secondary | ICD-10-CM | POA: Diagnosis present

## 2024-09-15 DIAGNOSIS — F4320 Adjustment disorder, unspecified: Secondary | ICD-10-CM | POA: Diagnosis not present

## 2024-09-15 DIAGNOSIS — K219 Gastro-esophageal reflux disease without esophagitis: Secondary | ICD-10-CM | POA: Diagnosis present

## 2024-09-15 DIAGNOSIS — Z8042 Family history of malignant neoplasm of prostate: Secondary | ICD-10-CM | POA: Diagnosis not present

## 2024-09-15 DIAGNOSIS — M79671 Pain in right foot: Secondary | ICD-10-CM | POA: Diagnosis present

## 2024-09-15 DIAGNOSIS — M1990 Unspecified osteoarthritis, unspecified site: Secondary | ICD-10-CM | POA: Diagnosis present

## 2024-09-15 DIAGNOSIS — R7989 Other specified abnormal findings of blood chemistry: Secondary | ICD-10-CM | POA: Diagnosis not present

## 2024-09-15 DIAGNOSIS — E871 Hypo-osmolality and hyponatremia: Secondary | ICD-10-CM | POA: Diagnosis not present

## 2024-09-15 DIAGNOSIS — M79A21 Nontraumatic compartment syndrome of right lower extremity: Secondary | ICD-10-CM | POA: Diagnosis not present

## 2024-09-15 DIAGNOSIS — I7143 Infrarenal abdominal aortic aneurysm, without rupture: Secondary | ICD-10-CM | POA: Diagnosis not present

## 2024-09-15 DIAGNOSIS — S90821A Blister (nonthermal), right foot, initial encounter: Secondary | ICD-10-CM | POA: Diagnosis not present

## 2024-09-15 MED ORDER — TAMSULOSIN HCL 0.4 MG PO CAPS
0.4000 mg | ORAL_CAPSULE | Freq: Every day | ORAL | Status: DC
  Administered 2024-09-15 – 2024-09-20 (×6): 0.4 mg via ORAL
  Filled 2024-09-15 (×6): qty 1

## 2024-09-15 MED ORDER — PRAVASTATIN SODIUM 40 MG PO TABS
20.0000 mg | ORAL_TABLET | Freq: Every day | ORAL | Status: DC
  Administered 2024-09-16: 20 mg via ORAL
  Filled 2024-09-15: qty 1

## 2024-09-15 MED ORDER — OXYCODONE-ACETAMINOPHEN 5-325 MG PO TABS
1.0000 | ORAL_TABLET | ORAL | Status: DC | PRN
  Administered 2024-09-15 – 2024-09-16 (×3): 2 via ORAL
  Filled 2024-09-15 (×4): qty 2

## 2024-09-15 MED ORDER — GABAPENTIN 300 MG PO CAPS
600.0000 mg | ORAL_CAPSULE | Freq: Three times a day (TID) | ORAL | Status: DC
  Administered 2024-09-15: 600 mg via ORAL
  Filled 2024-09-15 (×2): qty 2

## 2024-09-15 MED ORDER — ALBUTEROL SULFATE (2.5 MG/3ML) 0.083% IN NEBU: 3.0000 mL | INHALATION_SOLUTION | Freq: Four times a day (QID) | RESPIRATORY_TRACT | Status: DC | PRN

## 2024-09-15 MED ORDER — REVEFENACIN 175 MCG/3ML IN SOLN
175.0000 ug | Freq: Every day | RESPIRATORY_TRACT | Status: DC
  Administered 2024-09-17: 175 ug via RESPIRATORY_TRACT
  Filled 2024-09-15 (×5): qty 3

## 2024-09-15 MED ORDER — METHOCARBAMOL 1000 MG/10ML IJ SOLN: 500.0000 mg | Freq: Four times a day (QID) | INTRAMUSCULAR | Status: DC | PRN

## 2024-09-15 MED ORDER — MAGNESIUM GLUCONATE 500 (27 MG) MG PO TABS
250.0000 mg | ORAL_TABLET | Freq: Every day | ORAL | Status: DC
  Administered 2024-09-15 – 2024-09-23 (×9): 250 mg via ORAL
  Filled 2024-09-15 (×9): qty 1

## 2024-09-15 MED ORDER — ZOLPIDEM TARTRATE 5 MG PO TABS
5.0000 mg | ORAL_TABLET | Freq: Every evening | ORAL | Status: DC | PRN
  Administered 2024-09-15 – 2024-09-20 (×5): 5 mg via ORAL
  Filled 2024-09-15 (×6): qty 1

## 2024-09-15 MED ORDER — ACETAMINOPHEN 650 MG RE SUPP: 650.0000 mg | RECTAL | Status: DC | PRN

## 2024-09-15 MED ORDER — ARFORMOTEROL TARTRATE 15 MCG/2ML IN NEBU
15.0000 ug | INHALATION_SOLUTION | Freq: Two times a day (BID) | RESPIRATORY_TRACT | Status: DC
  Administered 2024-09-15 – 2024-09-17 (×2): 15 ug via RESPIRATORY_TRACT
  Filled 2024-09-15 (×8): qty 2

## 2024-09-15 MED ORDER — DOCUSATE SODIUM 100 MG PO CAPS
100.0000 mg | ORAL_CAPSULE | Freq: Two times a day (BID) | ORAL | Status: DC
  Administered 2024-09-15 – 2024-09-23 (×13): 100 mg via ORAL
  Filled 2024-09-15 (×18): qty 1

## 2024-09-15 MED ORDER — SENNOSIDES-DOCUSATE SODIUM 8.6-50 MG PO TABS: 1.0000 | ORAL_TABLET | Freq: Every evening | ORAL | Status: DC | PRN

## 2024-09-15 MED ORDER — BISACODYL 5 MG PO TBEC: 5.0000 mg | DELAYED_RELEASE_TABLET | Freq: Every day | ORAL | Status: DC | PRN

## 2024-09-15 MED ORDER — BUDESONIDE 0.5 MG/2ML IN SUSP
0.5000 mg | Freq: Two times a day (BID) | RESPIRATORY_TRACT | Status: DC
  Administered 2024-09-15 – 2024-09-18 (×4): 0.5 mg via RESPIRATORY_TRACT
  Filled 2024-09-15 (×10): qty 2

## 2024-09-15 MED ORDER — ACETAMINOPHEN 325 MG PO TABS: 650.0000 mg | ORAL_TABLET | ORAL | Status: DC | PRN

## 2024-09-15 MED ORDER — METHOCARBAMOL 500 MG PO TABS: 500.0000 mg | ORAL_TABLET | Freq: Four times a day (QID) | ORAL | Status: DC | PRN

## 2024-09-15 MED ORDER — ONDANSETRON HCL 4 MG PO TABS: 4.0000 mg | ORAL_TABLET | Freq: Four times a day (QID) | ORAL | Status: DC | PRN

## 2024-09-15 MED ORDER — ONDANSETRON HCL 4 MG/2ML IJ SOLN: 4.0000 mg | Freq: Four times a day (QID) | INTRAMUSCULAR | Status: DC | PRN

## 2024-09-15 MED ORDER — POLYETHYLENE GLYCOL 3350 17 G PO PACK
17.0000 g | PACK | Freq: Every day | ORAL | Status: DC
  Administered 2024-09-15 – 2024-09-17 (×3): 17 g via ORAL
  Filled 2024-09-15 (×4): qty 1

## 2024-09-15 NOTE — Discharge Summary (Signed)
 Physician Discharge Summary  Patient ID: [name] MRN: [id number] DOB/AGE: Jan 14, 1956 68 y.o.  Admit date: 09/15/2024 Discharge date: 09/24/2024  Discharge Diagnoses:  Principal Problem:   Lumbar radiculopathy Pain management Anaphylactic reaction during lumbar fusion with hypotension Hyperlipidemia COPD History of prostate cancer History of detached retina left eye Peripheral vascular disease with stenting of bilateral common iliac arteries   Discharged Condition: Stable  Significant Diagnostic Studies: VAS US  LOWER EXTREMITY ARTERIAL DUPLEX Result Date: 09/21/2024 LOWER EXTREMITY ARTERIAL DUPLEX STUDY Patient Name:  David King  Date of Exam:   09/20/2024 Medical Rec #: [id number]      Accession #:    [id number] Date of Birth: 1956/07/26      Patient Gender: M Patient Age:   35 years Exam Location:  [facility] Procedure:      VAS US  LOWER EXTREMITY ARTERIAL DUPLEX Referring Phys: [facility] --------------------------------------------------------------------------------  Indications: Rest pain, and peripheral artery disease. High Risk Factors: Hypertension, past history of smoking, coronary artery                    disease. Other Factors: PAD.  Vascular Interventions: Hx of bilateral CIA stenting, left EIA and CFA stenting                         at [facility] 02/26/2022. Current ABI:            unable to obtain Comparison Study: Previous arterial duplex on 07/05/2024 at [facility] was limited,                   however CFA, SFA, and PFA showed triphasic waveforms. RLE ABI                   on 05/05/2024 was 1.08 Performing Technologist: Ezzie Potters RVT, RDMS  Examination Guidelines: A complete evaluation includes B-mode imaging, spectral Doppler, color Doppler, and power Doppler as needed of all accessible portions of each vessel. Bilateral testing is considered an integral part of a complete examination. Limited examinations for reoccurring indications may be performed as noted.   +-----------+--------+-----+--------+-------------------+--------+ RIGHT      PSV cm/sRatioStenosisWaveform           Comments +-----------+--------+-----+--------+-------------------+--------+ CIA Prox   49                   dampened monophasic         +-----------+--------+-----+--------+-------------------+--------+ EIA Mid    19                   dampened monophasic         +-----------+--------+-----+--------+-------------------+--------+ CFA Mid    29                   dampened monophasic         +-----------+--------+-----+--------+-------------------+--------+ DFA        44                                               +-----------+--------+-----+--------+-------------------+--------+ SFA Prox   17                   dampened monophasic         +-----------+--------+-----+--------+-------------------+--------+ SFA Mid    16  dampened monophasic         +-----------+--------+-----+--------+-------------------+--------+ SFA Distal              occluded                            +-----------+--------+-----+--------+-------------------+--------+ POP Prox                occluded                            +-----------+--------+-----+--------+-------------------+--------+ POP Mid                 occluded                            +-----------+--------+-----+--------+-------------------+--------+ POP Distal 10                   dampened monophasic         +-----------+--------+-----+--------+-------------------+--------+ TP Trunk   6                    dampened monophasic         +-----------+--------+-----+--------+-------------------+--------+ ATA Prox   6                    dampened monophasic         +-----------+--------+-----+--------+-------------------+--------+ ATA Mid    4                    dampened monophasic         +-----------+--------+-----+--------+-------------------+--------+ ATA  Distal              occluded                            +-----------+--------+-----+--------+-------------------+--------+ PTA Prox   4                    dampened monophasic         +-----------+--------+-----+--------+-------------------+--------+ PTA Mid                 occluded                            +-----------+--------+-----+--------+-------------------+--------+ PTA Distal              occluded                            +-----------+--------+-----+--------+-------------------+--------+ PERO Prox               occluded                            +-----------+--------+-----+--------+-------------------+--------+ PERO Mid                occluded                            +-----------+--------+-----+--------+-------------------+--------+ PERO Distal             occluded                            +-----------+--------+-----+--------+-------------------+--------+ DP  occluded                            +-----------+--------+-----+--------+-------------------+--------+  Summary: Right: Dampened monophasic waveforms of common iliac artery suggestive of more proximal disease. Unable to visualize aorta due to patient movement and discomfort. Total occlusion noted in the distal superficial femoral artery. Total occlusion noted in the proximal and mid popliteal artery (reconstitutes in distal popliteal). Total occlusion noted in the distal anterior tibial artery. Total occlusion noted in the peroneal artery. Total occlusion noted in the dorsal pedis artery. Arterial wall calcifications seen throughout lower extremity.  See table(s) above for measurements and observations. Electronically signed by Debby Robertson on 09/21/2024 at 5:12:33 PM.    Final    PERIPHERAL VASCULAR CATHETERIZATION Result Date: 09/21/2024 Images from the original result were not included. Patient name: David King MRN: [id number] DOB: 1956/09/06 Sex: male 09/21/2024  Pre-operative Diagnosis: Right leg ischemia Post-operative diagnosis:  Same Surgeon:  [name] Procedure Performed:  1.  Ultrasound-guided access, left common femoral artery  2.  Selective injection with catheter in the common iliac artery  3.  Ultrasound-guided access, left brachial artery  4.  Catheter placement into abdominal aorta  5.  Abdominal aortogram  6.  Bilateral leg angiogram  7.  Conscious sedation, 15 minutes Indications: This is a 68 year old gentleman with history of vascular surgery with endarterectomy and iliac stenting and back surgery from a anterior and posterior approach.  He has been complaining of right leg pain.  He is here for further evaluation Procedure:  The patient was identified in the holding area and taken to room 8.  The patient was then placed supine on the table and prepped and draped in the usual sterile fashion.  A time out was called.  Conscious sedation was administered with the use of IV fentanyl  and Versed  under continuous physician and nurse monitoring.  Heart rate, blood pressure, and oxygen  saturation were continuously monitored.  Total sedation time was 50 minutes.  Ultrasound was used to evaluate the left common femoral artery.  It was patent, however there did appear to be thrombus within the common femoral artery.  A digital ultrasound image was acquired.  A micropuncture needle was used to access the left common femoral artery under ultrasound guidance.  An 018 wire was advanced without resistance and a micropuncture sheath was placed.  I then inserted a Bentson wire but met resistance.  I removed the micropuncture sheath and used a Berenstein 2 catheter.  I was confident the wire was in the iliac stents on the left but was concerned that I may be in a dissection within the aorta.  I shot a contrast injection that showed that there was opacification of his stents.  I then shot an aortogram to look like I was in a dissection.  Because the patient did not have  palpable femoral pulses I elected to stop.  I held pressure for 10 minutes. Attention was turned towards the left arm.  The left brachial artery was evaluated with ultrasound which was widely patent.  1% lidocaine  was used local anesthesia.  The left brachial artery was then cannulated under ultrasound guidance with a micropuncture needle.  An 018 wire was inserted followed by placement of micropuncture sheath.  I then placed a 5 French sheath over a Bentson wire.  Heparin  and nitroglycerin  were given through the sheath.  I then advanced a pigtail catheter into the abdominal aorta and aortogram  with bilateral runoff was performed.  Aortogram: The suprarenal abdominal aorta is patent.  The visualized portions of the superior mesenteric artery and bilateral renal arteries are widely patent.  There is occlusion of the infrarenal abdominal aorta at the level of the renal arteries.  There does appear to be reconstitution of the common femoral arteries bilaterally.  Right Lower Extremity: The right common femoral profundofemoral artery appeared to be patent.  The superficial femoral artery is patent down to the adductor canal where it occludes.  Distal evaluation was not possible given proximal obstruction  Left Lower Extremity: The left common femoral profundofemoral artery appeared to be patent.  The superficial femoral artery is patent down to the adductor canal where it occludes.  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 [name], M.D., [facility] [place] Office: [phone] Pager:  [phone]   VAS US  LOWER EXTREMITY VENOUS (DVT) Result Date: 09/16/2024  Lower Venous DVT Study Patient Name:  CHAO BLAZEJEWSKI  Date of Exam:   09/16/2024 Medical Rec #: [id number]      Accession #:     [id number] Date of Birth: 09/03/1956      Patient Gender: M Patient Age:   68 years Exam Location:  [facility] Procedure:      VAS US  LOWER EXTREMITY VENOUS (DVT) Referring Phys: TORIBIO PITCH --------------------------------------------------------------------------------  Indications: Swelling, Edema, and Pain.  Comparison Study: No prior exam. Performing Technologist: Edilia Elden Appl  Examination Guidelines: A complete evaluation includes B-mode imaging, spectral Doppler, color Doppler, and power Doppler as needed of all accessible portions of each vessel. Bilateral testing is considered an integral part of a complete examination. Limited examinations for reoccurring indications may be performed as noted. The reflux portion of the exam is performed with the patient in reverse Trendelenburg.  +---------+---------------+---------+-----------+----------+--------------+ RIGHT    CompressibilityPhasicitySpontaneityPropertiesThrombus Aging +---------+---------------+---------+-----------+----------+--------------+ CFV      Full           Yes      Yes                                 +---------+---------------+---------+-----------+----------+--------------+ SFJ      Full           Yes      Yes                                 +---------+---------------+---------+-----------+----------+--------------+ FV Prox  Full                                                        +---------+---------------+---------+-----------+----------+--------------+ FV Mid   Full                                                        +---------+---------------+---------+-----------+----------+--------------+ FV DistalFull                                                        +---------+---------------+---------+-----------+----------+--------------+  PFV      Full                                                         +---------+---------------+---------+-----------+----------+--------------+ POP      Full           Yes      Yes                                 +---------+---------------+---------+-----------+----------+--------------+ PTV      Full                                                        +---------+---------------+---------+-----------+----------+--------------+ PERO     Full                                                        +---------+---------------+---------+-----------+----------+--------------+   +---------+---------------+---------+-----------+----------+--------------+ LEFT     CompressibilityPhasicitySpontaneityPropertiesThrombus Aging +---------+---------------+---------+-----------+----------+--------------+ CFV      Full           Yes      Yes                                 +---------+---------------+---------+-----------+----------+--------------+ SFJ      Full           Yes      Yes                                 +---------+---------------+---------+-----------+----------+--------------+ FV Prox  Full                                                        +---------+---------------+---------+-----------+----------+--------------+ FV Mid   Full                                                        +---------+---------------+---------+-----------+----------+--------------+ FV DistalFull                                                        +---------+---------------+---------+-----------+----------+--------------+ PFV      Full                                                        +---------+---------------+---------+-----------+----------+--------------+  POP      Full           Yes      Yes                                 +---------+---------------+---------+-----------+----------+--------------+ PTV      Full                                                         +---------+---------------+---------+-----------+----------+--------------+ PERO     Full                                                        +---------+---------------+---------+-----------+----------+--------------+     Summary: BILATERAL: - No evidence of deep vein thrombosis seen in the lower extremities, bilaterally. -No evidence of popliteal cyst, bilaterally.   *See table(s) above for measurements and observations. Electronically signed by Debby Robertson on 09/16/2024 at 4:04:02 PM.    Final    MR BRAIN WO CONTRAST Result Date: 09/14/2024 CLINICAL DATA:  Right leg weakness and numbness EXAM: MRI HEAD WITHOUT CONTRAST TECHNIQUE: Multiplanar, multiecho pulse sequences of the brain and surrounding structures were obtained without intravenous contrast. COMPARISON:  None Available. FINDINGS: MRI brain: The brain volume is normal. The signal in the brain parenchyma is normal. There is no acute or chronic infarct. The ventricles are normal. No mass lesion. There are normal flow signals in the carotid arteries and basilar artery. No significant bone marrow signal abnormality. Incidental left mastoid effusion. IMPRESSION: No acute infarct or other significant abnormality Electronically Signed   By: Nancyann Burns M.D.   On: 09/14/2024 10:33   MR Lumbar Spine W Wo Contrast Result Date: 09/14/2024 CLINICAL DATA:  Low back pain, prior surgery, new symptoms EXAM: MRI LUMBAR SPINE WITHOUT AND WITH CONTRAST TECHNIQUE: Multiplanar and multiecho pulse sequences of the lumbar spine were obtained without and with intravenous contrast. CONTRAST:  9mL GADAVIST  GADOBUTROL  1 MMOL/ML IV SOLN COMPARISON:  None Available. FINDINGS: Segmentation:  Standard. Alignment:  Normal. Vertebrae: No fracture, evidence of discitis, or bone lesion. L5-S1 interbody fusion. L4-L5 PLIF. Conus medullaris and cauda equina: Conus extends to the L1-L2 level. Conus and cauda equina appear normal. Paraspinal and other soft tissues: Partially  imaged left renal cyst. Disc levels: T12-L1: No significant disc protrusion, foraminal stenosis, or canal stenosis. L1-L2: No significant disc protrusion, foraminal stenosis, or canal stenosis. L2-L3: Right eccentric disc bulging. Mild right foraminal stenosis. Mild right subarticular recess stenosis without significant central canal stenosis. L3-L4: Disc bulging, facet arthropathy and ligamentum thickening. Resulting mild canal stenosis and mild bilateral subarticular recess stenosis. Moderate left and mild right foraminal stenosis. L4-L5: PLIF with metallic artifact that limits assessment but there appears to be improved left foraminal stenosis with some enhancing granulation tissue in the left foramen and probably at least moderate foraminal stenosis. Right foramen and canal are patent. L5-S1: Interbody fusion. Endplate spurring contributes to moderate left and mild right foraminal stenosis. Patent canal. IMPRESSION: 1. At L4-L5,PLIF with metallic artifact that limits assessment but there appears to be improved left foraminal stenosis with probably at least  moderate residual foraminal stenosis. 2. At L5-S1, similar moderate left and mild right foraminal stenosis. Electronically Signed   By: Gilmore GORMAN Molt M.D.   On: 09/14/2024 00:52   MR THORACIC SPINE WO CONTRAST Result Date: 09/13/2024 EXAM: MRI THORACIC SPINE WITHOUT INTRAVENOUS CONTRAST 09/13/2024 01:54:07 PM TECHNIQUE: Multiplanar multisequence MRI of the thoracic spine was performed without the administration of intravenous contrast. COMPARISON: None available. CLINICAL HISTORY: Mid-back pain. FINDINGS: BONES AND ALIGNMENT: There is straightening of the normal thoracic kyphosis. Chronic compression deformity of T7 with up to 50% height loss in the anterior and central aspect of the vertebral body with findings suggestive of kyphoplasty. There is mild anterior wedging of the T11 and T12 vertebral bodies without edema, which may be physiologic versus  sequelae of mild trauma. Chronic compression deformity. The vertebral body heights are otherwise maintained. There is no evidence of acute fracture in the thoracic spine. SPINAL CORD: Normal spinal cord volume. No cord compression or cord signal abnormality. SOFT TISSUES: Unremarkable. DEGENERATIVE CHANGES: There are degenerative changes at the costovertebral articulations at multiple levels. There is no evidence of large disc herniation or high-grade spinal canal stenosis in the thoracic spine. There is facet arthrosis at multiple levels in the thoracic spine. Mild foraminal stenosis on the left at T6-7. Moderate right and mild left foraminal stenosis at T10-11. IMPRESSION: 1. No acute findings. 2. 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 3. Mild anterior wedging of the T11 and T12 vertebral bodies without edema, which may be physiologic versus sequelae of mild chronic compression deformity. 4. Degenerative changes as above. Mild foraminal stenosis on the left at T6-7 and moderate right and mild left foraminal stenosis at T10-11. Electronically signed by: Donnice Mania MD 09/13/2024 02:39 PM EDT RP Workstation: [id number]   DG CHEST PORT 1 VIEW Result Date: 09/08/2024 CLINICAL DATA:  [id number] Endotracheally intubated [id number]. EXAM: PORTABLE CHEST 1 VIEW COMPARISON:  06/12/2023. FINDINGS: Low lung volume. Mild diffuse pulmonary vascular congestion, likely accentuated by low lung volume. No frank pulmonary edema. Bilateral lung fields are clear. There is blunting of left lateral costophrenic angle, concerning for layering trace left pleural effusion. Right lateral costophrenic angle is clear. No pneumothorax on either side. Normal cardio-mediastinal silhouette. No acute osseous abnormalities. The soft tissues are within normal limits. Endotracheal tube is seen with its tip approximately 3-3.5 cm above the carina. IMPRESSION: Blunting of left lateral costophrenic angle,  concerning for layering trace left pleural effusion. Electronically Signed   By: Ree Molt M.D.   On: 09/08/2024 14:34   DG Lumbar Spine 2-3 Views Result Date: 09/08/2024 CLINICAL DATA:  Spinal fixation intraoperative images EXAM: LUMBAR SPINE - 2-3 VIEW COMPARISON:  Same day lumbar spine radiographs FLUOROSCOPY: Exposure Index (as provided by the fluoroscopic device): 26.5 mGy Kerma Fluoroscopy time: 30 seconds FINDINGS: Intraoperative images following L4-5 posterior spinal fixation with transpedicular screws bilaterally and interconnecting rods with L4-5 interbody spacer in place. Unchanged previously identified L5-S1 interbody spacer. Vascular stents. IMPRESSION: Intraoperative images demonstrating sequelae of recent L4-5 posterior spinal fixation. Please see dedicated operative report for further details. Electronically Signed   By: Michaeline Blanch M.D.   On: 09/08/2024 14:00   DG C-Arm 1-60 Min-No Report Result Date: 09/08/2024 Fluoroscopy was utilized by the requesting physician.  No radiographic interpretation.   DG C-Arm 1-60 Min-No Report Result Date: 09/08/2024 Fluoroscopy was utilized by the requesting physician.  No radiographic interpretation.   DG C-Arm 1-60 Min-No Report Result Date: 09/08/2024  Fluoroscopy was utilized by the requesting physician.  No radiographic interpretation.   DG Lumbar Spine 1 View Result Date: 09/08/2024 CLINICAL DATA:  Elective surgery. EXAM: LUMBAR SPINE - 1 VIEW COMPARISON:  MRI of the lumbar spine dated 06/26/2024. FINDINGS: Intraoperative localization image with probes overlying the posterior spinous processes at the L4-L5 level. Prior anterior fusion at L5-S1. Multilevel degenerative changes of the lumbar spine. Aortic atherosclerosis. Bilateral common iliac artery stents are noted. IMPRESSION: Intraoperative localization image with probes overlying the posterior spinous processes at the L4-L5 level. Electronically Signed   By: Harrietta Sherry M.D.   On:  09/08/2024 10:51    Labs:  Basic Metabolic Panel: Recent Labs  Lab 09/16/24 0557 09/17/24 0507 09/18/24 1233 09/19/24 1312  NA 134* 133* 128* 130*  K 3.8 3.8 4.0 4.0  CL 97* 100 95* 95*  CO2 23 24 20* 25  GLUCOSE 93 106* 104* 139*  BUN 26* 20 19 18   CREATININE 0.83 0.98 0.72 0.87  CALCIUM  8.8* 8.9 8.8* 9.0    CBC: Recent Labs  Lab 09/16/24 0557 09/17/24 0507 09/18/24 1603 09/19/24 1312  WBC 11.6* 12.3* 11.7* 11.1*  NEUTROABS 8.0* 8.5*  --   --   HGB 12.8* 13.7 13.0 12.5*  HCT 37.6* 40.5 37.7* 37.0*  MCV 97.4 97.1 96.2 96.6  PLT 236 310 290 262    CBG: No results for input(s): GLUCAP in the last 168 hours.   Brief HPI:   David King is a 68 y.o. right-handed male with history significant for anxiety migraine headaches detached retina left eye hypertension prostate cancer status post radical prostatectomy 2014, COPD quit smoking 7 years ago as well as history of peripheral vascular disease with stenting of his bilateral common iliac arteries.  Per chart review lives with spouse.  Modified independent prior to admission.  Presented 09/08/2024 with low back pain radiating to the left lower extremity.  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  Underwent left side L4-5 transforaminal lumbar interbody fusion posterior lateral fusion insertion of interbody device revision L4-5 decompression 09/08/2024 per Dr. Beuford.  Noted intraoperatively patient had developed hives swelling of tongue and lips along with hypotension with critical care medicine consulted possible anaphylactic shock but started on epi drip and given Decadron .  He did require short-term intubation and transferred to the ICU.  He was extubated 9/11.  MRI thoracic spine showed no acute findings.  MRI lumbar spine showed L4-5 PLIF moderate residual foraminal stenosis.  Since his surgery he had ongoing right leg weakness and numbness neurology  consulted for recommendations.  MRI of the brain showed no acute changes.  Neurology felt exam findings consistent with either hypersensitivity to pain or mild component of nonorganic findings.  Recommendations outpatient EMG if right lower extremity numbness persist.  Therapy evaluations completed due to patient decreased functional mobility was admitted for a comprehensive rehab program.   Hospital Course: POOKELA SELLIN was admitted to rehab 09/15/2024 for inpatient therapies to consist of PT, ST and OT at least three hours five days a week. Past admission physiatrist, therapy team and rehab RN have worked together to provide customized collaborative inpatient rehab.  Pertaining to patient's lumbar radiculopathy large disc herniation with spinal stenosis.  Status post L4-5 transforaminal interbody fusion posterior lateral fusion insertion interbody device 09/08/2024 per Dr. Beuford.  Surgical site clean and dry back brace when out of bed.  Patient did have some right lower extremity weakness no findings  on imaging consideration for outpatient EMG.  Patient did undergo aortogram/angiography per vascular surgery Dr. 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  Plan was for patient to be discharged to acute care services for surgical intervention.  SCDs for DVT prophylaxis venous Doppler studies negative.  Pain managed with use of Neurontin  600 mg 3 times daily as well as Robaxin  and oxycodone  as needed.  Hospital course patient did have anaphylactic reaction during lumbar fusion with hypotension responded to epi drip and Decadron  no further incidents noted.  Pravachol  ongoing for hyperlipidemia.  History of COPD remote tobacco abuse inhalers as directed oxygen  saturations maintained greater than 90%.  History of prostate cancer status post prostatectomy Flomax  as indicated  no dysuria or hematuria.  History of detached retina left eye follow-up outpatient ophthalmology.   Blood pressures were monitored on TID basis and remained controlled and monitored     Rehab course: During patient's stay in rehab weekly team conferences were held to monitor patient's progress, set goals and discuss barriers to discharge. At admission, patient required moderate assist for feet rolling walker minimal assist step pivot transfers  He/She  has had improvement in activity tolerance, balance, postural control as well as ability to compensate for deficits. He/She has had improvement in functional use RUE/LUE  and RLE/LLE as well as improvement in awareness.  Patient could wheel to the rehab gym with supervision.  Perform standing weight shifting/balance exercises generally walker for support.  He did have limited standing tolerance.  Gait with rolling walker contact-guard 30 feet step to pattern.  Bed mobility standby assist using bed features.  Toilet transfers ambulating from bed using rolling walker minimal assist to the toilet.  Toilet hygiene complete with moderate assist patient able to pull pants up over bottom standing with rolling walker.  He did need assist for donning and doffing TLSO brace.  He did need frequent rest breaks for endurance.       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 1.  Tylenol  as needed 2.  Colace 100 mg p.o. twice daily 3.  Neurontin  600 mg p.o. 3 times daily 4.  Robaxin  1000 mg 4 times daily 5.  MiraLAX  daily hold for loose stools 6.  Pravachol  20 mg p.o. daily 7.  Flomax  0.4 mg daily 8.  Vitamin D 1000 units daily 9.  Atrovent  nasal spray  0.03% 2 sprays into both nostrils twice daily 10.  Trelegy Ellipta  100-60 2.5-25 mcg 1 inhalation by mouth into the lungs daily 11.  Albuterol  inhaler 1 to 2 puffs every 6 hours as needed shortness of breath 12.  Vitamin C  1000 mg daily 13.  Aspirin  81 mg daily 14.  Dulcolax suppository daily as needed 15.  Cymbalta  30 mg nightly 16.  Lidoderm  patch 2 patches changes directed 17.  Magnesium  gluconate 250 mg bedtime 18.  Oxycodone  10 mg every 4 as needed moderate pain 19.  Oxycodone  15 mg every 4 hours as needed severe pain 20.  OxyContin  sustained-release 10 mg every 12 hours 21.  Lyrica  150 mg 3 times daily 22.  Sodium chloride  tablet 1 g p.o. twice daily 23.  Zinc  sulfate 220 mg p.o. daily       Follow-up Information     Lovorn, Megan, MD Follow up.   Specialty: Physical Medicine and Rehabilitation Why: No formal follow-up needed Contact information: [address] [place] [address] [phone]         [name], MD Follow up.   Specialty: Family Medicine Why: Call for appointment Contact information: [address] [place] [address] [phone]         Beuford Anes, MD Follow up.   Specialty: Orthopedic Surgery Why: Call for appointment Contact information: [address] [place] [address] [phone]         Sheree Penne Bruckner, MD Follow up.   Specialties: Vascular Surgery, Cardiology Why: Call for appointment Contact information: [address] [place] [address] [phone]                 Signed: Toribio JINNY Pitch 09/22/2024, 4:38 AM

## 2024-09-15 NOTE — Plan of Care (Signed)
  Problem: Education: Goal: Knowledge of General Education information will improve Description: Including pain rating scale, medication(s)/side effects and non-pharmacologic comfort measures Outcome: Progressing   Problem: Health Behavior/Discharge Planning: Goal: Ability to manage health-related needs will improve Outcome: Progressing   Problem: Clinical Measurements: Goal: Ability to maintain clinical measurements within normal limits will improve Outcome: Progressing Goal: Will remain free from infection Outcome: Progressing Goal: Diagnostic test results will improve Outcome: Progressing Goal: Respiratory complications will improve Outcome: Progressing Goal: Cardiovascular complication will be avoided Outcome: Progressing   Problem: Activity: Goal: Risk for activity intolerance will decrease Outcome: Progressing   Problem: Nutrition: Goal: Adequate nutrition will be maintained Outcome: Progressing   Problem: Coping: Goal: Level of anxiety will decrease Outcome: Progressing   Problem: Elimination: Goal: Will not experience complications related to bowel motility Outcome: Progressing Goal: Will not experience complications related to urinary retention Outcome: Progressing   Problem: Safety: Goal: Ability to remain free from injury will improve Outcome: Progressing   Problem: Pain Managment: Goal: General experience of comfort will improve and/or be controlled Outcome: Progressing   Problem: Skin Integrity: Goal: Risk for impaired skin integrity will decrease Outcome: Progressing   Problem: Education: Goal: Ability to verbalize activity precautions or restrictions will improve Outcome: Progressing Goal: Knowledge of the prescribed therapeutic regimen will improve Outcome: Progressing Goal: Understanding of discharge needs will improve Outcome: Progressing

## 2024-09-15 NOTE — Progress Notes (Signed)
    Patient is now voiding spontaneously I much appreciate the input of Dr. Voncile.  He has not been able to identify any specific pathology that would explain the patient's symptoms, nor  have I. Patient states he continues to have numbness throughout the right foot, with pain involving the anterior shin and posterior calf on the right. Patient has been ambulating. Patient was teary-eyed during portions of his examination, stating that he needed to get his  head right.   Physical Exam: Vitals:   09/15/24 0550 09/15/24 0828  BP: (!) 142/74 (!) 141/91  Pulse: 77 (!) 101  Resp: 19 19  Temp: 97.6 F (36.4 C) 97.6 F (36.4 C)  SpO2: 100% 95%   Patient continues to have decreased sensation to light touch and deep palpation throughout the right foot.  He reports dysesthesias to light touch at the anterior shin and calf on the right.  The skin is intact throughout these regions.  Today, he exhibits 0 out of 5 strength to dorsiflexion or plantarflexion of the right.   Pt status post L4-5 decompression and fusion, with an intraoperative anaphylactic reaction, and postoperative numbness and weakness, not explained after extensive workup.  The patient's lumbar MRI appears pristine, and neurology has not been able to identify any other pathology.  The patient's preoperative left leg pain is resolved.   - I continue to be unable to explain the patient's right lower extremity symptoms.  His extensive workup continues to be negative.  As noted above, his lumbar MRI appears pristine.  - up with PT/OT, encourage ambulation - Percocet for pain, Robaxin  for muscle spasms - Transfer to rehab today.  It is my current understanding that the bed is available to him. - I would kindly ask the PM&R team to consider an additional diagnosis of depression, and possibly treat accordingly.  As noted above, the patient was teary-eyed during portions of my evaluation with him today.

## 2024-09-15 NOTE — Discharge Instructions (Signed)
 Inpatient Rehab Discharge Instructions  David King Discharge date and time: No discharge date for patient encounter.   Activities/Precautions/ Functional Status: Activity: Back brace as directed Diet: Regular Wound Care: Routine skin checks Functional status:  ___ No restrictions     ___ Walk up steps independently ___ 24/7 supervision/assistance   ___ Walk up steps with assistance ___ Intermittent supervision/assistance  ___ Bathe/dress independently ___ Walk with walker     _x__ Bathe/dress with assistance ___ Walk Independently    ___ Shower independently ___ Walk with assistance    ___ Shower with assistance ___ No alcohol     ___ Return to work/school ________  Special Instructions: No driving smoking or alcohol   My questions have been answered and I understand these instructions. I will adhere to these goals and the provided educational materials after my discharge from the hospital.  Patient/Caregiver Signature _______________________________ Date __________  Clinician Signature _______________________________________ Date __________  Please bring this form and your medication list with you to all your follow-up doctor's appointments.

## 2024-09-15 NOTE — Progress Notes (Signed)
 Occupational Therapy Treatment Patient Details Name: David King MRN: [id number] DOB: [birth year] Today's Date: 09/15/2024   History of present illness 68 y.o. male presents to [facility] 09/08/24 for elective L4-L5 decompression surgery complicated with anaphylactic shock likely 2/2 cephalosporins. Intubated and ICU stay from 9/10-9/11. PMHx: COPD, CAD, PAD, tobacco use, lumbar radiculopathy, detached retina L eye   OT comments  Pt agreeable to OT/PT session.  Pt requires min-mod assist +2 for transfers, improved ease from recliner vs EOB.  Stood at sink during oral care with min assist, cueing for back precaution adherence.  Min assist to don brace, total assist for LB dressing. Will follow acutely, continue to recommend >3hrs/day inpatient setting at dc.       If plan is discharge home, recommend the following:  A lot of help with walking and/or transfers;A lot of help with bathing/dressing/bathroom;Assistance with cooking/housework;Assist for transportation   Equipment Recommendations  BSC/3in1;Other (comment) (RW)    Recommendations for Other Services Rehab consult    Precautions / Restrictions Precautions Precautions: Fall;Back Precaution Booklet Issued: Yes (comment) Recall of Precautions/Restrictions: Intact Required Braces or Orthoses: Spinal Brace Spinal Brace: Thoracolumbosacral orthotic;Applied in sitting position Restrictions Weight Bearing Restrictions Per Provider Order: No       Mobility Bed Mobility Overal bed mobility: Needs Assistance Bed Mobility: Rolling, Sidelying to Sit Rolling: Contact guard assist Sidelying to sit: Contact guard assist       General bed mobility comments: cueing for log roll technique    Transfers Overall transfer level: Needs assistance Equipment used: Rolling walker (2 wheels) Transfers: Sit to/from Stand Sit to Stand: Mod assist, Min assist, +2 physical assistance           General transfer comment: increased assist from EOB,  min assist +2 from recliner. cueing for hand placement and technique, posture.     Balance Overall balance assessment: Needs assistance Sitting-balance support: Bilateral upper extremity supported, No upper extremity supported, Feet supported Sitting balance-Leahy Scale: Fair Sitting balance - Comments: sitting EOB preference to BUE support but able to sit without UE support   Standing balance support: Bilateral upper extremity supported, During functional activity, Reliant on assistive device for balance, No upper extremity supported Standing balance-Leahy Scale: Poor Standing balance comment: reliant on RW, able to stand statically during grooming at sink with min posterior sway at times                           ADL either performed or assessed with clinical judgement   ADL Overall ADL's : Needs assistance/impaired     Grooming: Minimal assistance;Oral care;Standing           Upper Body Dressing : Minimal assistance;Sitting   Lower Body Dressing: Total assistance;+2 for physical assistance;Sit to/from stand   Toilet Transfer: Moderate assistance;+2 for physical assistance;Ambulation;Rolling walker (2 wheels)           Functional mobility during ADLs: Minimal assistance;Moderate assistance;+2 for physical assistance;Rolling walker (2 wheels)      Extremity/Trunk Assessment              Vision       Perception     Praxis     Communication Communication Communication: No apparent difficulties   Cognition Arousal: Alert Behavior During Therapy: WFL for tasks assessed/performed Cognition: No apparent impairments  Following commands: Intact        Cueing   Cueing Techniques: Verbal cues  Exercises      Shoulder Instructions       General Comments      Pertinent Vitals/ Pain       Pain Assessment Pain Assessment: Faces Faces Pain Scale: Hurts even more Pain Location: low back, RLE Pain  Descriptors / Indicators: Aching, Discomfort, Sore Pain Intervention(s): Limited activity within patient's tolerance, Monitored during session, Repositioned  Home Living                                          Prior Functioning/Environment              Frequency  Min 2X/week        Progress Toward Goals  OT Goals(current goals can now be found in the care plan section)  Progress towards OT goals: Progressing toward goals  Acute Rehab OT Goals Patient Stated Goal: rehab OT Goal Formulation: With patient Time For Goal Achievement: 09/24/24 Potential to Achieve Goals: Good  Plan      Co-evaluation    PT/OT/SLP Co-Evaluation/Treatment: Yes Reason for Co-Treatment: For patient/therapist safety;To address functional/ADL transfers   OT goals addressed during session: ADL's and self-care      AM-PAC OT 6 Clicks Daily Activity     Outcome Measure   Help from another person eating meals?: A Little Help from another person taking care of personal grooming?: A Little Help from another person toileting, which includes using toliet, bedpan, or urinal?: A Lot Help from another person bathing (including washing, rinsing, drying)?: A Lot Help from another person to put on and taking off regular upper body clothing?: A Little Help from another person to put on and taking off regular lower body clothing?: A Lot 6 Click Score: 15    End of Session Equipment Utilized During Treatment: Gait belt;Rolling walker (2 wheels);Back brace  OT Visit Diagnosis: Unsteadiness on feet (R26.81);Other abnormalities of gait and mobility (R26.89);Muscle weakness (generalized) (M62.81);Pain Pain - Right/Left: Right Pain - part of body: Ankle and joints of foot   Activity Tolerance Patient tolerated treatment well   Patient Left in chair;with call bell/phone within reach;with chair alarm set;with family/visitor present   Nurse Communication Mobility status;Precautions         Time: 8957-8891 OT Time Calculation (min): 26 min  Charges: OT General Charges $OT Visit: 1 Visit OT Treatments $Self Care/Home Management : 8-22 mins  Etta NOVAK, OT Acute Rehabilitation Services Office [phone] Secure Chat Preferred    Etta GORMAN Hope 09/15/2024, 12:47 PM

## 2024-09-15 NOTE — Progress Notes (Signed)
 Inpatient Rehab Admissions Coordinator:    I have a CIR bed for this Pt., if attending will d/c her.   Leita Kleine, MS, CCC-SLP Rehab Admissions Coordinator  [phone] (celll) [phone] (office)

## 2024-09-15 NOTE — H&P (Signed)
 Physical Medicine and Rehabilitation Admission H&P     HPI: David King is a 68 year old right handed male with history significant for anxiety, migraine headaches, detached retina left eye, hypertension, prostate cancer status post radical prostatectomy 2014, COPD/quit smoking 7 years ago.  Per chart review patient lives with spouse.  1 level home 4 steps to entry.  Modified independent with cane prior to admission.  Presented 09/08/2024 with low back pain radiating to the left lower extremity.  X-rays and imaging revealed left-sided lumbar radiculopathy with large disc herniation/L4-5 spinal stenosis.  Underwent left-sided L4-5 transforaminal lumbar interbody fusion/posterior lateral fusion insertion of interbody device revision L4-5 decompression 09/08/2024 per Dr. Beuford.  Noted intraoperatively patient had developed hives swelling of tongue and lips along with hypotension critical care medicine consulted possible anaphylactic shock was started on epi drip and given Decadron .  He did require short-term intubation and transferred to the ICU.  He was extubated 9/11.  MRI T-spine with no acute findings.  MRI lumbar spine showed L4-5 PLIF moderate residual foraminal stenosis..  Since his surgery he had ongoing right leg weakness and numbness neurology was consulted for recommendations.  MRI of the brain showed no acute changes.  Neurology felt that exam findings consistent with either hypersensitivity to pain or mild component of nonorganic findings.  Recommendations were for outpatient EMG if right lower extremity numbness persists.  Therapy evaluations completed with brace when out of bed.  Therapy evaluations completed due to patient decreased functional mobility was admitted for a comprehensive rehab program. Patient is currently tearful about his current condition.   Review of Systems  Constitutional:  Negative for fever.  HENT:  Negative for hearing loss.   Eyes:        Visual impairment left  eye  Respiratory:  Negative for cough, shortness of breath and wheezing.   Cardiovascular:  Negative for chest pain, palpitations and leg swelling.  Gastrointestinal:  Positive for constipation. Negative for heartburn and nausea.  Genitourinary:  Positive for urgency. Negative for dysuria, flank pain and hematuria.       Nocturia  Musculoskeletal:  Positive for back pain and myalgias.  Skin:  Negative for rash.  Neurological:  Positive for sensory change, weakness and headaches.  Psychiatric/Behavioral:  Positive for depression.        Anxiety  All other systems reviewed and are negative.  Past Medical History:  Diagnosis Date   Anxiety    new dx   Arthritis    lumbar   Atherosclerotic vascular disease    Cataract    COPD (chronic obstructive pulmonary disease) (HCC)    Diverticulosis    Elevated PSA    Headache(784.0)    MIGRAINES   Hypertension 2021   Iritis    CHRONIC IN LEFT EYE - SOME VISIAL IMPAIRMENT IN LEFT EYE   Neuromuscular disorder (HCC)    left leg/foot,pinched siactic nerve   Pain    PAIN LEFT HIP AND DOWN LT LEG WITH NUMBNESS IN LEFT LEG--PT STATES SCIATIC NERVE IMPINGEMENT - PT PLANS BACK IN THE NEAR SURGERY.   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   Renal cysts, acquired, bilateral 03/19/2013   several simple , CT   Urinary frequency    AND NOCTURIA   Past Surgical History:  Procedure Laterality Date   BACK SURGERY     CARDIAC CATHETERIZATION  03/10/2018   CATARACT EXTRACTION W/ INTRAOCULAR LENS IMPLANT Left    EYE SURGERY  RETINAL SURGERY LEFT EYE   HYDROCELE EXCISION Left 03/05/2013   Procedure: HYDROCELECTOMY ADULT;  Surgeon: Donnice Gwenyth Brooks, MD;  Location: [facility];  Service: Urology;  Laterality: Left;   INCISIONAL HERNIA REPAIR N/A 06/18/2022   Procedure: REPAIR OF INTERNAL HERNIA;  Surgeon: Rubin Calamity, MD;  Location: [facility] OR;  Service: General;  Laterality: N/A;    INGUINAL HERNIA REPAIR Bilateral AGE 51   INSERTION OF MESH N/A 06/11/2022   Procedure: INSERTION OF MESH;  Surgeon: Rubin Calamity, MD;  Location: [facility] OR;  Service: General;  Laterality: N/A;   INSERTION OF MESH N/A 06/18/2022   Procedure: INSERTION OF MESH;  Surgeon: Rubin Calamity, MD;  Location: [facility] OR;  Service: General;  Laterality: N/A;   KYPHOPLASTY N/A 05/08/2023   Procedure: THORACIC SEVEN KYPHOPLASTY;  Surgeon: Beuford Anes, MD;  Location: [facility] OR;  Service: Orthopedics;  Laterality: N/A;   LAPAROSCOPY N/A 06/18/2022   Procedure: LAPAROSCOPY DIAGNOSTIC;  Surgeon: Rubin Calamity, MD;  Location: [facility] OR;  Service: General;  Laterality: N/A;   LYMPHADENECTOMY Bilateral 05/06/2013   Procedure: REDGIE;  Surgeon: Noretta Ferrara, MD;  Location: [facility];  Service: Urology;  Laterality: Bilateral;   PROSTATE BIOPSY N/A 03/05/2013   Procedure: PROSTATE BIOPSY and ultrasound;  Surgeon: Donnice Gwenyth Brooks, MD;  Location: [facility];  Service: Urology;  Laterality: N/A;   REMOVAL BURSA SAC, LEFT ELBOW  1996   ROBOT ASSISTED LAPAROSCOPIC RADICAL PROSTATECTOMY N/A 05/06/2013   Procedure: ROBOTIC ASSISTED LAPAROSCOPIC RADICAL PROSTATECTOMY LEVEL 3;  Surgeon: Noretta Ferrara, MD;  Location: [facility];  Service: Urology;  Laterality: N/A;   TONSILLECTOMY     as achild   TRANSFORAMINAL LUMBAR INTERBODY FUSION (TLIF) WITH PEDICLE SCREW FIXATION 1 LEVEL Left 09/08/2024   Procedure: LEFT-SIDED LUMBAR 4- LUMBAR 5 TRANSFORAMINAL LUMBAR INTERBODY FUSION AND DECOMPRESSION WITH INSTRUMENTATION AND ALLOGRAFT;  Surgeon: Beuford Anes, MD;  Location: [facility] OR;  Service: Orthopedics;  Laterality: Left;  LEFT-SIDED LUMBAR 4- LUMBAR 5 TRANSFORAMINAL LUMBAR INTERBODY FUSION AND DECOMPRESSION WITH INSTRUMENTATION AND ALLOGRAFT   XI ROBOTIC ASSISTED VENTRAL HERNIA N/A 06/11/2022   Procedure: ROBOTIC INCISIONAL HERNIA REPAIR WITH MESH;  Surgeon: Rubin Calamity, MD;  Location: [facility] OR;  Service: General;   Laterality: N/A;   Family History  Problem Relation Age of Onset   Cancer Father        prostate cancer   Cancer Paternal Grandfather        prostate   Colon cancer Neg Hx    Colon polyps Neg Hx    Esophageal cancer Neg Hx    Rectal cancer Neg Hx    Stomach cancer Neg Hx    Social History:  reports that he quit smoking about 7 years ago. His smoking use included cigarettes. He started smoking about 54 years ago. He has a 94 pack-year smoking history. He has never used smokeless tobacco. He reports current alcohol use of about 14.0 standard drinks of alcohol per week. He reports that he does not use drugs. Allergies:  Allergies  Allergen Reactions   Ancef  [Cefazolin ] Anaphylaxis   Medications Prior to Admission  Medication Sig Dispense Refill   albuterol  (VENTOLIN  HFA) 108 (90 Base) MCG/ACT inhaler USE 1-2 PUFFS EVERY 6 HOURS AS NEEDED FOR WHEEZING OR SHORTNESS OF BREATH. 8.5 g 2   cholecalciferol (VITAMIN D3) 25 MCG (1000 UNIT) tablet Take 1,000 Units by mouth daily.     Coal Tar Extract [phone] PSORIASIS MEDICATED EX) Apply 1 Application topically 3 (three) times daily as  needed (psoriasis).     diphenhydramine -acetaminophen  (TYLENOL  PM EXTRA STRENGTH) 25-500 MG TABS tablet Take 2 tablets by mouth at bedtime.     gabapentin  (NEURONTIN ) 300 MG capsule Take 600 mg by mouth 2 (two) times daily.     ipratropium (ATROVENT ) 0.03 % nasal spray Place 2 sprays into both nostrils 2 (two) times daily.     pravastatin  (PRAVACHOL ) 20 MG tablet Take 20 mg by mouth in the morning.     TRELEGY ELLIPTA  100-62.5-25 MCG/ACT AEPB USE 1 INHALATION BY MOUTH INTO  THE LUNGS ONCE DAILY AT THE SAME TIME EACH DAY 180 each 3              Home: Home Living Family/patient expects to be discharged to:: Private residence Living Arrangements: Spouse/significant other Available Help at Discharge: Family, Available 24 hours/day Type of Home: House Home Access: Stairs to enter [employer] of  Steps: 4 Entrance Stairs-Rails: Can reach both Home Layout: One level Bathroom Shower/Tub: Health visitor: Handicapped height Bathroom Accessibility: Yes Home Equipment: Agricultural consultant (2 wheels), Shower seat, Grab bars - tub/shower, Medical laboratory scientific officer - single point, Adaptive equipment, Hand held shower head, Other (comment) (hurry cane) Adaptive Equipment: Reacher  Lives With: Spouse   Functional History: Prior Function Prior Level of Function : Independent/Modified Independent, Driving Mobility Comments: mod I with hurry cane ADLs Comments: ind   Functional Status:  Mobility: Bed Mobility Overal bed mobility: Needs Assistance Bed Mobility: Sidelying to Sit Rolling: Contact guard assist Sidelying to sit: Used rails, Contact guard assist Sit to sidelying: Min assist, Used rails, HOB elevated General bed mobility comments: increased time Transfers Overall transfer level: Needs assistance Equipment used: Rolling walker (2 wheels) Transfers: Sit to/from Stand, Bed to chair/wheelchair/BSC Sit to Stand: Mod assist Bed to/from chair/wheelchair/BSC transfer type:: Step pivot Step pivot transfers: Min assist General transfer comment: STS from EOB and recliner with mod A for power up and anterior weight shift. Cues for hand placement. Slow steps with pt sliding RLE due to foot drop and weakness Ambulation/Gait Ambulation/Gait assistance: Mod assist, +2 physical assistance, +2 safety/equipment Gait Distance (Feet): 4 Feet Assistive device: Rolling walker (2 wheels) Gait Pattern/deviations: Step-through pattern, Decreased stride length, Trunk flexed, Decreased dorsiflexion - right General Gait Details: unable to progress due to dizziness Gait velocity: decr   ADL: ADL Overall ADL's : Needs assistance/impaired Eating/Feeding: Sitting, Set up Grooming: Sitting, Set up, Oral care Upper Body Bathing: Sitting, Set up Lower Body Bathing: Moderate assistance, Sitting/lateral  leans Upper Body Dressing : Sitting, Minimal assistance Lower Body Dressing: Total assistance, +2 for physical assistance, Sit to/from stand Toilet Transfer: Moderate assistance, +2 for physical assistance, Ambulation, Rolling walker (2 wheels) Toilet Transfer Details (indicate cue type and reason): simulated side stepping at EOB Toileting- Clothing Manipulation and Hygiene: Moderate assistance, +2 for safety/equipment Functional mobility during ADLs: Moderate assistance, Minimal assistance, +2 for physical assistance   Cognition: Cognition Orientation Level: Oriented X4 Cognition Arousal: Alert Behavior During Therapy: WFL for tasks assessed/performed  Physical Exam: Blood pressure 137/79, pulse 68, temperature 98.6 F (37 C), resp. rate 18, height 5' 9 (1.753 m), weight 83.2 kg, SpO2 98%.  Physical Exam Gen: no distress, normal appearing, BMI 30.38 HEENT: oral mucosa pink and moist, NCAT Cardio: Tachycardia Chest: normal effort, normal rate of breathing Abd: soft, non-distended Ext: no edema Psych: tearful regarding his current situation Skin: intact Neurological:     Comments: Patient is alert oriented x 3 following commands. Right lower extremity strength is 4/5 proximally  and 2/5 distally, LLE 4/5 throughout, decreased sensation in right foot Results for orders placed or performed during the hospital encounter of 09/08/24 (from the past 48 hours)  Glucose, capillary     Status: Abnormal   Collection Time: 09/13/24  9:26 PM  Result Value Ref Range   Glucose-Capillary 149 (H) 70 - 99 mg/dL    Comment: Glucose reference range applies only to samples taken after fasting for at least 8 hours.   MR BRAIN WO CONTRAST Result Date: 09/14/2024 CLINICAL DATA:  Right leg weakness and numbness EXAM: MRI HEAD WITHOUT CONTRAST TECHNIQUE: Multiplanar, multiecho pulse sequences of the brain and surrounding structures were obtained without intravenous contrast. COMPARISON:  None Available.  FINDINGS: MRI brain: The brain volume is normal. The signal in the brain parenchyma is normal. There is no acute or chronic infarct. The ventricles are normal. No mass lesion. There are normal flow signals in the carotid arteries and basilar artery. No significant bone marrow signal abnormality. Incidental left mastoid effusion. IMPRESSION: No acute infarct or other significant abnormality Electronically Signed   By: Nancyann Burns M.D.   On: 09/14/2024 10:33   MR Lumbar Spine W Wo Contrast Result Date: 09/14/2024 CLINICAL DATA:  Low back pain, prior surgery, new symptoms EXAM: MRI LUMBAR SPINE WITHOUT AND WITH CONTRAST TECHNIQUE: Multiplanar and multiecho pulse sequences of the lumbar spine were obtained without and with intravenous contrast. CONTRAST:  9mL GADAVIST  GADOBUTROL  1 MMOL/ML IV SOLN COMPARISON:  None Available. FINDINGS: Segmentation:  Standard. Alignment:  Normal. Vertebrae: No fracture, evidence of discitis, or bone lesion. L5-S1 interbody fusion. L4-L5 PLIF. Conus medullaris and cauda equina: Conus extends to the L1-L2 level. Conus and cauda equina appear normal. Paraspinal and other soft tissues: Partially imaged left renal cyst. Disc levels: T12-L1: No significant disc protrusion, foraminal stenosis, or canal stenosis. L1-L2: No significant disc protrusion, foraminal stenosis, or canal stenosis. L2-L3: Right eccentric disc bulging. Mild right foraminal stenosis. Mild right subarticular recess stenosis without significant central canal stenosis. L3-L4: Disc bulging, facet arthropathy and ligamentum thickening. Resulting mild canal stenosis and mild bilateral subarticular recess stenosis. Moderate left and mild right foraminal stenosis. L4-L5: PLIF with metallic artifact that limits assessment but there appears to be improved left foraminal stenosis with some enhancing granulation tissue in the left foramen and probably at least moderate foraminal stenosis. Right foramen and canal are patent. L5-S1:  Interbody fusion. Endplate spurring contributes to moderate left and mild right foraminal stenosis. Patent canal. IMPRESSION: 1. At L4-L5,PLIF with metallic artifact that limits assessment but there appears to be improved left foraminal stenosis with probably at least moderate residual foraminal stenosis. 2. At L5-S1, similar moderate left and mild right foraminal stenosis. Electronically Signed   By: Gilmore GORMAN Molt M.D.   On: 09/14/2024 00:52      Blood pressure 137/79, pulse 68, temperature 98.6 F (37 C), resp. rate 18, height 5' 9 (1.753 m), weight 83.2 kg, SpO2 98%.  Medical Problem List and Plan: 1. 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  Status post L4-5 transforaminal interbody fusion/posterior lateral fusion insertion interbody device L4-5 decompression 09/08/2024 per Dr. Beuford.  Back brace when out of bed  -patient may shower  -ELOS/Goals: 10-14 days   Admit to CIR  2.  Antithrombotics: -DVT/anticoagulation:  Mechanical: Antiembolism stockings, thigh (TED hose) Bilateral lower extremities.  Check vascular study  -antiplatelet therapy: N/A 3. Pain Management: Neurontin  600 mg 3 times daily, Robaxin  and oxycodone  as needed 4. Anxiety: would  benefit from neuropsych eval, magnesium  supplement started. Ambien  as needed  -antipsychotic agents: N/A 5. Neuropsych/cognition: This patient is capable of making decisions on his own behalf. 6. Tachycardia: magnesium  supplement started  7. Fluids/Electrolytes/Nutrition: Routine in and outs with follow-up chemistries.  8.  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  Patient responded to epi drip and Decadron .  9.  Hyperlipidemia.  Pravachol   10.  COPD.  Remote tobacco use.  Continue inhalers as directed.  Check oxygen  saturations every shift  11.  History of prostate cancer.  Status post prostatectomy.  Continue Flomax  0.4 mg daily.  12.  History of  detached retina left eye.  Follow-up outpatient ophthalmology  I have personally performed a face to face diagnostic evaluation, including, but not limited to relevant history and physical exam findings, of this patient and developed relevant assessment and plan.  Additionally, I have reviewed and concur with the physician assistant's documentation above.   Toribio PARAS Angiulli, PA-C   Earmon Sherrow P Johnsie Moscoso, MD 09/15/2024

## 2024-09-15 NOTE — Plan of Care (Signed)
  Problem: Health Behavior/Discharge Planning: Goal: Ability to manage health-related needs will improve Outcome: Progressing   Problem: Clinical Measurements: Goal: Will remain free from infection Outcome: Progressing   Problem: Clinical Measurements: Goal: Ability to maintain clinical measurements within normal limits will improve Outcome: Progressing   Problem: Activity: Goal: Risk for activity intolerance will decrease Outcome: Progressing   Problem: Elimination: Goal: Will not experience complications related to bowel motility Outcome: Progressing   Problem: Elimination: Goal: Will not experience complications related to urinary retention Outcome: Progressing   Problem: Pain Managment: Goal: General experience of comfort will improve and/or be controlled Outcome: Progressing   Problem: Safety: Goal: Ability to remain free from injury will improve Outcome: Progressing   Problem: Skin Integrity: Goal: Risk for impaired skin integrity will decrease Outcome: Progressing
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
## 2024-09-15 NOTE — H&P (Addendum)
 Physical Medicine and Rehabilitation Admission H&P     HPI: David King is a 68 year old right handed male with history significant for anxiety, migraine headaches, detached retina left eye, hypertension, prostate cancer status post radical prostatectomy 2014, COPD/quit smoking 7 years ago.  Per chart review patient lives with spouse.  1 level home 4 steps to entry.  Modified independent with cane prior to admission.  Presented 09/08/2024 with low back pain radiating to the left lower extremity.  X-rays and imaging revealed left-sided lumbar radiculopathy with large disc herniation/L4-5 spinal stenosis.  Underwent left-sided L4-5 transforaminal lumbar interbody fusion/posterior lateral fusion insertion of interbody device revision L4-5 decompression 09/08/2024 per Dr. Beuford.  Noted intraoperatively patient had developed hives swelling of tongue and lips along with hypotension critical care medicine consulted possible anaphylactic shock was started on epi drip and given Decadron .  He did require short-term intubation and transferred to the ICU.  He was extubated 9/11.  MRI T-spine with no acute findings.  MRI lumbar spine showed L4-5 PLIF moderate residual foraminal stenosis..  Since his surgery he had ongoing right leg weakness and numbness neurology was consulted for recommendations.  MRI of the brain showed no acute changes.  Neurology felt that exam findings consistent with either hypersensitivity to pain or mild component of nonorganic findings.  Recommendations were for outpatient EMG if right lower extremity numbness persists.  Therapy evaluations completed with brace when out of bed.  Therapy evaluations completed due to patient decreased functional mobility was admitted for a comprehensive rehab program. Patient is currently tearful about his current condition.   Review of Systems  Constitutional:  Negative for fever.  HENT:  Negative for hearing loss.   Eyes:        Visual impairment left  eye  Respiratory:  Negative for cough, shortness of breath and wheezing.   Cardiovascular:  Negative for chest pain, palpitations and leg swelling.  Gastrointestinal:  Positive for constipation. Negative for heartburn and nausea.  Genitourinary:  Positive for urgency. Negative for dysuria, flank pain and hematuria.       Nocturia  Musculoskeletal:  Positive for back pain and myalgias.  Skin:  Negative for rash.  Neurological:  Positive for sensory change, weakness and headaches.  Psychiatric/Behavioral:  Positive for depression.        Anxiety  All other systems reviewed and are negative.  Past Medical History:  Diagnosis Date   Anxiety    new dx   Arthritis    lumbar   Atherosclerotic vascular disease    Cataract    COPD (chronic obstructive pulmonary disease) (HCC)    Diverticulosis    Elevated PSA    Headache(784.0)    MIGRAINES   Hypertension 2021   Iritis    CHRONIC IN LEFT EYE - SOME VISIAL IMPAIRMENT IN LEFT EYE   Neuromuscular disorder (HCC)    left leg/foot,pinched siactic nerve   Pain    PAIN LEFT HIP AND DOWN LT LEG WITH NUMBNESS IN LEFT LEG--PT STATES SCIATIC NERVE IMPINGEMENT - PT PLANS BACK IN THE NEAR SURGERY.   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   Renal cysts, acquired, bilateral 03/19/2013   several simple , CT   Urinary frequency    AND NOCTURIA   Past Surgical History:  Procedure Laterality Date   BACK SURGERY     CARDIAC CATHETERIZATION  03/10/2018   CATARACT EXTRACTION W/ INTRAOCULAR LENS IMPLANT Left    EYE SURGERY  RETINAL SURGERY LEFT EYE   HYDROCELE EXCISION Left 03/05/2013   Procedure: HYDROCELECTOMY ADULT;  Surgeon: Donnice Gwenyth Brooks, MD;  Location: [facility];  Service: Urology;  Laterality: Left;   INCISIONAL HERNIA REPAIR N/A 06/18/2022   Procedure: REPAIR OF INTERNAL HERNIA;  Surgeon: Rubin Calamity, MD;  Location: [facility] OR;  Service: General;  Laterality: N/A;    INGUINAL HERNIA REPAIR Bilateral AGE 80   INSERTION OF MESH N/A 06/11/2022   Procedure: INSERTION OF MESH;  Surgeon: Rubin Calamity, MD;  Location: [facility] OR;  Service: General;  Laterality: N/A;   INSERTION OF MESH N/A 06/18/2022   Procedure: INSERTION OF MESH;  Surgeon: Rubin Calamity, MD;  Location: [facility] OR;  Service: General;  Laterality: N/A;   KYPHOPLASTY N/A 05/08/2023   Procedure: THORACIC SEVEN KYPHOPLASTY;  Surgeon: Beuford Anes, MD;  Location: [facility] OR;  Service: Orthopedics;  Laterality: N/A;   LAPAROSCOPY N/A 06/18/2022   Procedure: LAPAROSCOPY DIAGNOSTIC;  Surgeon: Rubin Calamity, MD;  Location: [facility] OR;  Service: General;  Laterality: N/A;   LYMPHADENECTOMY Bilateral 05/06/2013   Procedure: REDGIE;  Surgeon: Noretta Ferrara, MD;  Location: [facility];  Service: Urology;  Laterality: Bilateral;   PROSTATE BIOPSY N/A 03/05/2013   Procedure: PROSTATE BIOPSY and ultrasound;  Surgeon: Donnice Gwenyth Brooks, MD;  Location: [facility];  Service: Urology;  Laterality: N/A;   REMOVAL BURSA SAC, LEFT ELBOW  1996   ROBOT ASSISTED LAPAROSCOPIC RADICAL PROSTATECTOMY N/A 05/06/2013   Procedure: ROBOTIC ASSISTED LAPAROSCOPIC RADICAL PROSTATECTOMY LEVEL 3;  Surgeon: Noretta Ferrara, MD;  Location: [facility];  Service: Urology;  Laterality: N/A;   TONSILLECTOMY     as achild   TRANSFORAMINAL LUMBAR INTERBODY FUSION (TLIF) WITH PEDICLE SCREW FIXATION 1 LEVEL Left 09/08/2024   Procedure: LEFT-SIDED LUMBAR 4- LUMBAR 5 TRANSFORAMINAL LUMBAR INTERBODY FUSION AND DECOMPRESSION WITH INSTRUMENTATION AND ALLOGRAFT;  Surgeon: Beuford Anes, MD;  Location: [facility] OR;  Service: Orthopedics;  Laterality: Left;  LEFT-SIDED LUMBAR 4- LUMBAR 5 TRANSFORAMINAL LUMBAR INTERBODY FUSION AND DECOMPRESSION WITH INSTRUMENTATION AND ALLOGRAFT   XI ROBOTIC ASSISTED VENTRAL HERNIA N/A 06/11/2022   Procedure: ROBOTIC INCISIONAL HERNIA REPAIR WITH MESH;  Surgeon: Rubin Calamity, MD;  Location: [facility] OR;  Service: General;   Laterality: N/A;   Family History  Problem Relation Age of Onset   Cancer Father        prostate cancer   Cancer Paternal Grandfather        prostate   Colon cancer Neg Hx    Colon polyps Neg Hx    Esophageal cancer Neg Hx    Rectal cancer Neg Hx    Stomach cancer Neg Hx    Social History:  reports that he quit smoking about 7 years ago. His smoking use included cigarettes. He started smoking about 54 years ago. He has a 94 pack-year smoking history. He has never used smokeless tobacco. He reports current alcohol use of about 14.0 standard drinks of alcohol per week. He reports that he does not use drugs. Allergies:  Allergies  Allergen Reactions   Ancef  [Cefazolin ] Anaphylaxis   Medications Prior to Admission  Medication Sig Dispense Refill   cholecalciferol (VITAMIN D3) 25 MCG (1000 UNIT) tablet Take 1,000 Units by mouth daily.     diphenhydramine -acetaminophen  (TYLENOL  PM EXTRA STRENGTH) 25-500 MG TABS tablet Take 2 tablets by mouth at bedtime.     gabapentin  (NEURONTIN ) 300 MG capsule Take 600 mg by mouth 2 (two) times daily.     ipratropium (ATROVENT ) 0.03 % nasal  spray Place 2 sprays into both nostrils 2 (two) times daily.     pravastatin  (PRAVACHOL ) 20 MG tablet Take 20 mg by mouth in the morning.     TRELEGY ELLIPTA  100-62.5-25 MCG/ACT AEPB USE 1 INHALATION BY MOUTH INTO  THE LUNGS ONCE DAILY AT THE SAME TIME EACH DAY 180 each 3   albuterol  (VENTOLIN  HFA) 108 (90 Base) MCG/ACT inhaler USE 1-2 PUFFS EVERY 6 HOURS AS NEEDED FOR WHEEZING OR SHORTNESS OF BREATH. 8.5 g 2   Coal Tar Extract [phone] PSORIASIS MEDICATED EX) Apply 1 Application topically 3 (three) times daily as needed (psoriasis).        Home: Home Living Family/patient expects to be discharged to:: Private residence Living Arrangements: Spouse/significant other Available Help at Discharge: Family, Available 24 hours/day Type of Home: House Home Access: Stairs to enter [employer] of Steps:  4 Entrance Stairs-Rails: Can reach both Home Layout: One level Bathroom Shower/Tub: Health visitor: Handicapped height Bathroom Accessibility: Yes Home Equipment: Agricultural consultant (2 wheels), Shower seat, Grab bars - tub/shower, Medical laboratory scientific officer - single point, Adaptive equipment, Hand held shower head, Other (comment) (hurry cane) Adaptive Equipment: Reacher  Lives With: Spouse   Functional History: Prior Function Prior Level of Function : Independent/Modified Independent, Driving Mobility Comments: mod I with hurry cane ADLs Comments: ind  Functional Status:  Mobility: Bed Mobility Overal bed mobility: Needs Assistance Bed Mobility: Sidelying to Sit Rolling: Contact guard assist Sidelying to sit: Used rails, Contact guard assist Sit to sidelying: Min assist, Used rails, HOB elevated General bed mobility comments: increased time Transfers Overall transfer level: Needs assistance Equipment used: Rolling walker (2 wheels) Transfers: Sit to/from Stand, Bed to chair/wheelchair/BSC Sit to Stand: Mod assist Bed to/from chair/wheelchair/BSC transfer type:: Step pivot Step pivot transfers: Min assist General transfer comment: STS from EOB and recliner with mod A for power up and anterior weight shift. Cues for hand placement. Slow steps with pt sliding RLE due to foot drop and weakness Ambulation/Gait Ambulation/Gait assistance: Mod assist, +2 physical assistance, +2 safety/equipment Gait Distance (Feet): 4 Feet Assistive device: Rolling walker (2 wheels) Gait Pattern/deviations: Step-through pattern, Decreased stride length, Trunk flexed, Decreased dorsiflexion - right General Gait Details: unable to progress due to dizziness Gait velocity: decr    ADL: ADL Overall ADL's : Needs assistance/impaired Eating/Feeding: Sitting, Set up Grooming: Sitting, Set up, Oral care Upper Body Bathing: Sitting, Set up Lower Body Bathing: Moderate assistance, Sitting/lateral leans Upper  Body Dressing : Sitting, Minimal assistance Lower Body Dressing: Total assistance, +2 for physical assistance, Sit to/from stand Toilet Transfer: Moderate assistance, +2 for physical assistance, Ambulation, Rolling walker (2 wheels) Toilet Transfer Details (indicate cue type and reason): simulated side stepping at EOB Toileting- Clothing Manipulation and Hygiene: Moderate assistance, +2 for safety/equipment Functional mobility during ADLs: Moderate assistance, Minimal assistance, +2 for physical assistance  Cognition: Cognition Orientation Level: Oriented X4 Cognition Arousal: Alert Behavior During Therapy: WFL for tasks assessed/performed  Physical Exam: Blood pressure (!) 141/91, pulse (!) 101, temperature 97.6 F (36.4 C), temperature source Oral, resp. rate 19, height 5' 9 (1.753 m), weight 93.3 kg, SpO2 95%.  Physical Exam Gen: no distress, normal appearing, BMI 30.38 HEENT: oral mucosa pink and moist, NCAT Cardio: Tachycardia Chest: normal effort, normal rate of breathing Abd: soft, non-distended Ext: no edema Psych: tearful regarding his current situation Skin: intact Neurological:     Comments: Patient is alert oriented x 3 following commands. Right lower extremity strength is 4/5 proximally and 2/5 distally, LLE  4/5 throughout, decreased sensation in right foot Results for orders placed or performed during the hospital encounter of 09/08/24 (from the past 48 hours)  Glucose, capillary     Status: Abnormal   Collection Time: 09/13/24 11:47 AM  Result Value Ref Range   Glucose-Capillary 111 (H) 70 - 99 mg/dL    Comment: Glucose reference range applies only to samples taken after fasting for at least 8 hours.   Comment 1 Notify RN   Glucose, capillary     Status: Abnormal   Collection Time: 09/13/24  4:28 PM  Result Value Ref Range   Glucose-Capillary 149 (H) 70 - 99 mg/dL    Comment: Glucose reference range applies only to samples taken after fasting for at least 8  hours.   Comment 1 Notify RN   Glucose, capillary     Status: Abnormal   Collection Time: 09/13/24  9:26 PM  Result Value Ref Range   Glucose-Capillary 149 (H) 70 - 99 mg/dL    Comment: Glucose reference range applies only to samples taken after fasting for at least 8 hours.   MR BRAIN WO CONTRAST Result Date: 09/14/2024 CLINICAL DATA:  Right leg weakness and numbness EXAM: MRI HEAD WITHOUT CONTRAST TECHNIQUE: Multiplanar, multiecho pulse sequences of the brain and surrounding structures were obtained without intravenous contrast. COMPARISON:  None Available. FINDINGS: MRI brain: The brain volume is normal. The signal in the brain parenchyma is normal. There is no acute or chronic infarct. The ventricles are normal. No mass lesion. There are normal flow signals in the carotid arteries and basilar artery. No significant bone marrow signal abnormality. Incidental left mastoid effusion. IMPRESSION: No acute infarct or other significant abnormality Electronically Signed   By: Nancyann Burns M.D.   On: 09/14/2024 10:33   MR Lumbar Spine W Wo Contrast Result Date: 09/14/2024 CLINICAL DATA:  Low back pain, prior surgery, new symptoms EXAM: MRI LUMBAR SPINE WITHOUT AND WITH CONTRAST TECHNIQUE: Multiplanar and multiecho pulse sequences of the lumbar spine were obtained without and with intravenous contrast. CONTRAST:  9mL GADAVIST  GADOBUTROL  1 MMOL/ML IV SOLN COMPARISON:  None Available. FINDINGS: Segmentation:  Standard. Alignment:  Normal. Vertebrae: No fracture, evidence of discitis, or bone lesion. L5-S1 interbody fusion. L4-L5 PLIF. Conus medullaris and cauda equina: Conus extends to the L1-L2 level. Conus and cauda equina appear normal. Paraspinal and other soft tissues: Partially imaged left renal cyst. Disc levels: T12-L1: No significant disc protrusion, foraminal stenosis, or canal stenosis. L1-L2: No significant disc protrusion, foraminal stenosis, or canal stenosis. L2-L3: Right eccentric disc bulging.  Mild right foraminal stenosis. Mild right subarticular recess stenosis without significant central canal stenosis. L3-L4: Disc bulging, facet arthropathy and ligamentum thickening. Resulting mild canal stenosis and mild bilateral subarticular recess stenosis. Moderate left and mild right foraminal stenosis. L4-L5: PLIF with metallic artifact that limits assessment but there appears to be improved left foraminal stenosis with some enhancing granulation tissue in the left foramen and probably at least moderate foraminal stenosis. Right foramen and canal are patent. L5-S1: Interbody fusion. Endplate spurring contributes to moderate left and mild right foraminal stenosis. Patent canal. IMPRESSION: 1. At L4-L5,PLIF with metallic artifact that limits assessment but there appears to be improved left foraminal stenosis with probably at least moderate residual foraminal stenosis. 2. At L5-S1, similar moderate left and mild right foraminal stenosis. Electronically Signed   By: Gilmore GORMAN Molt M.D.   On: 09/14/2024 00:52   MR THORACIC SPINE WO CONTRAST Result Date: 09/13/2024 EXAM: MRI THORACIC SPINE WITHOUT  INTRAVENOUS CONTRAST 09/13/2024 01:54:07 PM TECHNIQUE: Multiplanar multisequence MRI of the thoracic spine was performed without the administration of intravenous contrast. COMPARISON: None available. CLINICAL HISTORY: Mid-back pain. FINDINGS: BONES AND ALIGNMENT: There is straightening of the normal thoracic kyphosis. Chronic compression deformity of T7 with up to 50% height loss in the anterior and central aspect of the vertebral body with findings suggestive of kyphoplasty. There is mild anterior wedging of the T11 and T12 vertebral bodies without edema, which may be physiologic versus sequelae of mild trauma. Chronic compression deformity. The vertebral body heights are otherwise maintained. There is no evidence of acute fracture in the thoracic spine. SPINAL CORD: Normal spinal cord volume. No cord compression or  cord signal abnormality. SOFT TISSUES: Unremarkable. DEGENERATIVE CHANGES: There are degenerative changes at the costovertebral articulations at multiple levels. There is no evidence of large disc herniation or high-grade spinal canal stenosis in the thoracic spine. There is facet arthrosis at multiple levels in the thoracic spine. Mild foraminal stenosis on the left at T6-7. Moderate right and mild left foraminal stenosis at T10-11. IMPRESSION: 1. No acute findings. 2. 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 3. Mild anterior wedging of the T11 and T12 vertebral bodies without edema, which may be physiologic versus sequelae of mild chronic compression deformity. 4. Degenerative changes as above. Mild foraminal stenosis on the left at T6-7 and moderate right and mild left foraminal stenosis at T10-11. Electronically signed by: Donnice Mania MD 09/13/2024 02:39 PM EDT RP Workstation: [id number]      Blood pressure (!) 141/91, pulse (!) 101, temperature 97.6 F (36.4 C), temperature source Oral, resp. rate 19, height 5' 9 (1.753 m), weight 93.3 kg, SpO2 95%.  Medical Problem List and Plan: 1. 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  Status post L4-5 transforaminal interbody fusion/posterior lateral fusion insertion interbody device L4-5 decompression 09/08/2024 per Dr. Beuford.  Back brace when out of bed  -patient may shower  -ELOS/Goals: 10-14 days   Admit to CIR  2.  Antithrombotics: -DVT/anticoagulation:  Mechanical: Antiembolism stockings, thigh (TED hose) Bilateral lower extremities.  Check vascular study  -antiplatelet therapy: N/A 3. Pain Management: Neurontin  600 mg 3 times daily, Robaxin  and oxycodone  as needed 4. Anxiety: would benefit from neuropsych eval, magnesium  supplement started. Ambien  as needed  -antipsychotic agents: N/A 5. Neuropsych/cognition: This patient is capable of  making decisions on his own behalf. 6. Tachycardia: magnesium  supplement started  7. Fluids/Electrolytes/Nutrition: Routine in and outs with follow-up chemistries.  8.  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  Patient responded to epi drip and Decadron .  9.  Hyperlipidemia.  Pravachol   10.  COPD.  Remote tobacco use.  Continue inhalers as directed.  Check oxygen  saturations every shift  11.  History of prostate cancer.  Status post prostatectomy.  Continue Flomax  0.4 mg daily.  12.  History of detached retina left eye.  Follow-up outpatient ophthalmology  I have personally performed a face to face diagnostic evaluation, including, but not limited to relevant history and physical exam findings, of this patient and developed relevant assessment and plan.  Additionally, I have reviewed and concur with the physician assistant's documentation above.   Toribio PARAS Angiulli, PA-C   Amiliana Foutz P Rees Santistevan, MD 09/15/2024

## 2024-09-15 NOTE — Progress Notes (Addendum)
 PMR Admission Coordinator Pre-Admission Assessment   Patient: David King is an 68 y.o., male MRN: [id number] DOB: 1956-10-27 Height: 5' 9 (175.3 cm) Weight: 93.3 kg   Insurance Information HMO:  yes   PPO:      PCP:      IPA:      80/20:      OTHER:  PRIMARY: UHC Medicare      Policy#: [id number], Medicare: [id number]     Subscriber: pt CM Name: UM dept      Phone#: [phone], option 8     Fax#:  [phone]  I received auth for CIR from Jon with [facility] for admit 9/16 through 9/22.  Updates due to UM dept  at fax listed above. Pre-Cert#: [id number]     Employer:  Benefits:  Phone #:      Name:  Eustacio Date: 12/31/2023 - still active Deductible: $0 (does not have deductible) OOP Max: $3,800 ($788.11 met)  CIR: $355/day co-pay for days 1-5, $0/day co-pay for days 6+  SNF: $0.00 Copayment per day for days 1-20; $203 Copayment per day for days 21-100 for Medicare-covered care/maximum 100 days/benefit period  Outpatient: $20 copay/visit Home Health:  100% coverage, 0% co-insurance; limited by medical necessity  DME: 80% coverage; 20% co-insurance Providers: in network      SECONDARY:       Policy#:      Phone#:    Artist:       Phone#:    The Data processing manager" for patients in Inpatient Rehabilitation Facilities with attached "Privacy Act Statement-Health Care Records" was provided and verbally reviewed with: Patient   Emergency Contact Information Contact Information       Name Relation Home Work Mobile    David King,David King  [place]) Spouse [phone]   [phone]         Other Contacts   None on File        Current Medical History  Patient Admitting Diagnosis: Lumbar Myelopathy History of Present Illness:  David King is a 68 y.o. male with PMHx of  has a past medical history of Anxiety, Arthritis, Atherosclerotic vascular disease, Cataract, COPD (chronic obstructive pulmonary disease) (HCC), Diverticulosis, Elevated PSA,  Headache(784.0), Hypertension (2021), Iritis, Neuromuscular disorder (HCC), Pain, Peripheral vascular disease (HCC), Prostate cancer (HCC) (03/05/2013), Renal cysts, acquired, bilateral (03/19/2013), and Urinary frequency. . They were admitted to [facility] on 09/08/2024 for lumbar stenosis with left radiculopathy, status post prior L4-5 decompression, presenting for planned TLIF with pedicle screw at L4-5.  Procedure was performed by Dr. Beuford on 9-10, and was complicated by significant hypotension 50s over 20s secondary to anaphylactic reaction to anesthesia, was eventually stabilized after several doses of epinephrine  and epinephrine  infusion, Ortho was able to complete the surgery without further incident.  He remained intubated postop due to concern for angioedema, and was admitted to the ICU.  Patient was extubated 9-11, successfully transition to 4 L nasal cannula.  Of note, had tingling/numbness in his bilateral feet and a right foot drop on awakening, which has not been worked up.  Hospitalization otherwise complicated by constipation, urinary retention with Foley, and for pain control with Percocet and Robaxin .  PM&R was consulted to evaluate appropriateness for IPR admission.    Patient's medical record from [facility] has been reviewed by the rehabilitation admission coordinator and physician.   Past Medical History      Past Medical History:  Diagnosis Date   Anxiety  new dx   Arthritis      lumbar   Atherosclerotic vascular disease     Cataract     COPD (chronic obstructive pulmonary disease) (HCC)     Diverticulosis     Elevated PSA     Headache(784.0)      MIGRAINES   Hypertension 2021   Iritis      CHRONIC IN LEFT EYE - SOME VISIAL IMPAIRMENT IN LEFT EYE   Neuromuscular disorder (HCC)      left leg/foot,pinched siactic nerve   Pain      PAIN LEFT HIP AND DOWN LT LEG WITH NUMBNESS IN LEFT LEG--PT STATES SCIATIC NERVE IMPINGEMENT - PT PLANS  BACK IN THE NEAR SURGERY.   Peripheral vascular disease (HCC)     Prostate cancer (HCC) 03/05/2013    Adenocarcinoma   Renal cysts, acquired, bilateral 03/19/2013    several simple , CT   Urinary frequency      AND NOCTURIA          Has the patient had major surgery during 100 days prior to admission? Yes   Family History   family history includes Cancer in his father and paternal grandfather.   Current Medications  Current Medications    Current Facility-Administered Medications:    acetaminophen  (TYLENOL ) tablet 650 mg, 650 mg, Oral, Q4H PRN, 650 mg at 09/09/24 1540 **OR** acetaminophen  (TYLENOL ) suppository 650 mg, 650 mg, Rectal, Q4H PRN, McKenzie, Kayla J, PA-C   albuterol  (PROVENTIL ) (2.5 MG/3ML) 0.083% nebulizer solution 3 mL, 3 mL, Inhalation, Q6H PRN, McKenzie, Kayla J, PA-C   alum & mag hydroxide-simeth (MAALOX/MYLANTA) 200-200-20 MG/5ML suspension 30 mL, 30 mL, Oral, Q6H PRN, McKenzie, Kayla J, PA-C   arformoterol  (BROVANA ) nebulizer solution 15 mcg, 15 mcg, Nebulization, BID, Bowser, Grace E, NP, 15 mcg at 09/12/24 0746   bisacodyl  (DULCOLAX) EC tablet 5 mg, 5 mg, Oral, Daily PRN, McKenzie, Kayla J, PA-C   budesonide  (PULMICORT ) nebulizer solution 0.5 mg, 0.5 mg, Nebulization, BID, Bowser, Grace E, NP, 0.5 mg at 09/12/24 9256   Chlorhexidine  Gluconate Cloth 2 % PADS 6 each, 6 each, Topical, Daily, Dewald, Jonathan B, MD, 6 each at 09/12/24 1001   docusate sodium  (COLACE) capsule 100 mg, 100 mg, Oral, BID, Paytes, Austin A, RPH, 100 mg at 09/12/24 1001   gabapentin  (NEURONTIN ) capsule 600 mg, 600 mg, Oral, TID, Wham, Adine BROCKS, MD   HYDROcodone -acetaminophen  (NORCO/VICODIN) 5-325 MG per tablet 1 tablet, 1 tablet, Oral, Q6H PRN, Bowser, Ronnald BRAVO, NP, 1 tablet at 09/12/24 0013   insulin  aspart (novoLOG ) injection 0-9 Units, 0-9 Units, Subcutaneous, Q4H, Ogan, Okoronkwo U, MD, 1 Units at 09/11/24 1741   labetalol  (NORMODYNE ) injection 10 mg, 10 mg, Intravenous, Q2H PRN, Ogan,  Okoronkwo U, MD   lactose free nutrition (BOOST PLUS) liquid 237 mL, 237 mL, Oral, TID WC, Dumonski, Oneil, MD, 237 mL at 09/11/24 1742   menthol -cetylpyridinium (CEPACOL) lozenge 3 mg, 1 lozenge, Oral, PRN **OR** phenol (CHLORASEPTIC) mouth spray 1 spray, 1 spray, Mouth/Throat, PRN, McKenzie, Kayla J, PA-C   methocarbamol  (ROBAXIN ) tablet 500-1,000 mg, 500-1,000 mg, Oral, Q6H PRN, 1,000 mg at 09/12/24 0013 **OR** methocarbamol  (ROBAXIN ) injection 500 mg, 500 mg, Intravenous, Q6H PRN, McKenzie, Kayla J, PA-C   ondansetron  (ZOFRAN ) tablet 4 mg, 4 mg, Oral, Q6H PRN **OR** ondansetron  (ZOFRAN ) injection 4 mg, 4 mg, Intravenous, Q6H PRN, McKenzie, Kayla J, PA-C   Oral care mouth rinse, 15 mL, Mouth Rinse, PRN, Bowser, Grace E, NP   Oral care mouth rinse,  15 mL, Mouth Rinse, PRN, Dewald, Jonathan B, MD   Oral care mouth rinse, 15 mL, Mouth Rinse, 4 times per day, Harold Scholz, MD, 15 mL at 09/12/24 0800   Oral care mouth rinse, 15 mL, Mouth Rinse, PRN, Chand, Sudham, MD   oxyCODONE -acetaminophen  (PERCOCET/ROXICET) 5-325 MG per tablet 1-2 tablet, 1-2 tablet, Oral, Q4H PRN, McKenzie, Kayla J, PA-C, 2 tablet at 09/12/24 1216   polyethylene glycol (MIRALAX  / GLYCOLAX ) packet 17 g, 17 g, Oral, Daily, Paytes, Austin A, RPH   pravastatin  (PRAVACHOL ) tablet 20 mg, 20 mg, Oral, Daily, McKenzie, Kayla J, PA-C, 20 mg at 09/12/24 1001   revefenacin  (YUPELRI ) nebulizer solution 175 mcg, 175 mcg, Nebulization, Daily, Bowser, Grace E, NP, 175 mcg at 09/12/24 0745   senna-docusate (Senokot-S) tablet 1 tablet, 1 tablet, Oral, QHS PRN, McKenzie, Kayla J, PA-C   sodium chloride  flush (NS) 0.9 % injection 3 mL, 3 mL, Intravenous, Q12H, McKenzie, Kayla J, PA-C, 3 mL at 09/12/24 1002   sodium chloride  flush (NS) 0.9 % injection 3 mL, 3 mL, Intravenous, PRN, McKenzie, Kayla J, PA-C   sodium phosphate  (FLEET) enema 1 enema, 1 enema, Rectal, Once PRN, McKenzie, Kayla J, PA-C   tamsulosin  (FLOMAX ) capsule 0.4 mg, 0.4 mg, Oral,  Daily, Dumonski, Mark, MD, 0.4 mg at 09/12/24 1001   zolpidem  (AMBIEN ) tablet 5 mg, 5 mg, Oral, QHS PRN, McKenzie, Kayla J, PA-C, 5 mg at 09/11/24 2154     Patients Current Diet:  Diet Order                  Diet regular Room service appropriate? Yes; Fluid consistency: Thin  Diet effective now                         Precautions / Restrictions Precautions Precautions: Fall, Back Precaution Booklet Issued: Yes (comment) Spinal Brace: Thoracolumbosacral orthotic, Applied in sitting position Restrictions Weight Bearing Restrictions Per Provider Order: (P) No    Has the patient had 2 or more falls or a fall with injury in the past year? No   Prior Activity Level Community (5-7x/wk): Pt. active in the community PTA   Prior Functional Level Self Care: Did the patient need help bathing, dressing, using the toilet or eating? Independent   Indoor Mobility: Did the patient need assistance with walking from room to room (with or without device)? Independent   Stairs: Did the patient need assistance with internal or external stairs (with or without device)? Independent   Functional Cognition: Did the patient need help planning regular tasks such as shopping or remembering to take medications? Independent   Patient Information Are you of Hispanic, Latino/a,or Spanish origin?: A. No, not of Hispanic, Latino/a, or Spanish origin What is your race?: A. White Do you need or want an interpreter to communicate with a doctor or health care staff?: 0. No   Patient's Response To:  Health Literacy and Transportation Is the patient able to respond to health literacy and transportation needs?: Yes Health Literacy - How often do you need to have someone help you when you read instructions, pamphlets, or other written material from your doctor or pharmacy?: Never In the past 12 months, has lack of transportation kept you from medical appointments or from getting medications?: No In the past 12  months, has lack of transportation kept you from meetings, work, or from getting things needed for daily living?: No   Home Assistive Devices / Equipment Home Equipment: Agricultural consultant (  2 wheels), Shower seat, Grab bars - tub/shower, Cane - single point, Adaptive equipment, Hand held shower head, Other (comment) (hurry cane)   Prior Device Use: Indicate devices/aids used by the patient prior to current illness, exacerbation or injury? None of the above   Current Functional Level Cognition   Orientation Level: (P) Oriented X4    Extremity Assessment (includes Sensation/Coordination)   Upper Extremity Assessment: Generalized weakness, RUE deficits/detail, LUE deficits/detail RUE Deficits / Details: weak grip strength, denies sensation changes. LUE Deficits / Details: weak grip strength, denies sensation changes.  Lower Extremity Assessment: RLE deficits/detail, LLE deficits/detail RLE Deficits / Details: Hip flexion 3/5, Knee ext 4+/5, Limited ankle DF ROM- 3/5 RLE Sensation: decreased light touch (decreased sensation on plantar surface of foot) LLE Deficits / Details: Hip flexion 3/5, Knee ext 4+/5, Ankle DF 4+/5 LLE Sensation: WNL     ADLs   Overall ADL's : Needs assistance/impaired Eating/Feeding: Sitting, Set up Grooming: Sitting, Wash/dry face, Set up Upper Body Bathing: Sitting, Set up Lower Body Bathing: Moderate assistance, Sitting/lateral leans Upper Body Dressing : Sitting, Minimal assistance Lower Body Dressing: Maximal assistance, Sitting/lateral leans Toilet Transfer: Stand-pivot, BSC/3in1, Rolling walker (2 wheels), +2 for physical assistance, +2 for safety/equipment, Moderate assistance Toileting- Clothing Manipulation and Hygiene: Moderate assistance, +2 for safety/equipment Functional mobility during ADLs: +2 for safety/equipment, +2 for physical assistance, Moderate assistance, Rolling walker (2 wheels)     Mobility   Overal bed mobility: Needs Assistance Bed  Mobility: Rolling, Sidelying to Sit Rolling: Contact guard assist Sidelying to sit: Mod assist, Used rails General bed mobility comments: able to demonstrate log roll technique with no cueing, ModA to assist with trunk elevation     Transfers   Overall transfer level: Needs assistance Equipment used: Rolling walker (2 wheels) Transfers: Sit to/from Stand, Bed to chair/wheelchair/BSC Sit to Stand: Mod assist, +2 physical assistance, +2 safety/equipment Bed to/from chair/wheelchair/BSC transfer type:: Step pivot Step pivot transfers: Mod assist, +2 physical assistance, +2 safety/equipment General transfer comment: ModAx2 for boost-up and steadying assist. Pt initially keeps knees flexed with forward flexed posture. With time and cues, able to correct. Initially had difficulty stepping with LLE with need to weight-shift to offload. Progressed to being able to take steps without having to weight-shift     Ambulation / Gait / Stairs / Wheelchair Mobility   Ambulation/Gait Ambulation/Gait assistance: Mod assist, +2 physical assistance, +2 safety/equipment Gait Distance (Feet): 4 Feet Assistive device: Rolling walker (2 wheels) Gait Pattern/deviations: Step-through pattern, Decreased stride length, Trunk flexed, Decreased dorsiflexion - right General Gait Details: took lateral steps with ModAx2 and use of RW. Increased difficulty stepping with LLE Gait velocity: decr     Posture / Balance Balance Overall balance assessment: Mild deficits observed, not formally tested     Special considerations/life events  Skin surgical incision and Special service needs TSLO brace    Previous Home Environment (from acute therapy documentation) Living Arrangements: Spouse/significant other  Lives With: Spouse Available Help at Discharge: Family, Available 24 hours/day Type of Home: House Home Layout: One level Home Access: Stairs to enter Entrance Stairs-Rails: Can reach both Entrance Stairs-Number of  Steps: 4 Bathroom Shower/Tub: Health visitor: Handicapped height Bathroom Accessibility: Yes How Accessible: Accessible via walker   Discharge Living Setting Plans for Discharge Living Setting: Patient's home Type of Home at Discharge: House Discharge Home Layout: One level Discharge Home Access: Stairs to enter Entrance Stairs-Rails: Can reach both Entrance Stairs-Number of Steps: 4 Discharge Bathroom Shower/Tub: Walk-in shower  Discharge Bathroom Toilet: Handicapped height Discharge Bathroom Accessibility: Yes How Accessible: Accessible via walker   Social/Family/Support Systems Patient Roles: Spouse Contact Information: David King Anticipated Caregiver: [phone] Ability/Limitations of Caregiver: 24/7 min A Caregiver Availability: 24/7 Discharge Plan Discussed with Primary Caregiver: Yes Is Caregiver In Agreement with Plan?: Yes Does Caregiver/Family have Issues with Lodging/Transportation while Pt is in Rehab?: No   Goals Patient/Family Goal for Rehab: PT/OT supervision Expected length of stay: 10-14 days Pt/Family Agrees to Admission and willing to participate: Yes Program Orientation Provided & Reviewed with Pt/Caregiver Including Roles  & Responsibilities: Yes   Decrease burden of Care through IP rehab admission: Not anticipated   Possible need for SNF placement upon discharge: not anticipated   Patient Condition: This patient's medical and functional status has changed since the consult dated: 08/30/24 in which the Rehabilitation Physician determined and documented that the patient's condition is appropriate for intensive rehabilitative care in an inpatient rehabilitation facility. See History of Present Illness (above) for medical update. Functional changes are: Pt. Min-mod A. Patient's medical and functional status update has been discussed with the Rehabilitation physician and patient remains appropriate for inpatient rehabilitation. Will admit to  inpatient rehab today.   Preadmission Screen Completed By:  Leita KATHEE Kleine, 09/12/2024 2:19 PM ______________________________________________________________________   Discussed status with Dr. Lorilee on 09/15/24 at 1039 and received approval for admission today.   Admission Coordinator:  Leita KATHEE Kleine, CCC-SLP, time 1039/Date 09/15/24

## 2024-09-15 NOTE — H&P (Shared)
 Physical Medicine and Rehabilitation Admission H&P     HPI: David King is a 68 year old right handed male with history significant for anxiety, migraine headaches, detached retina left eye, hypertension, prostate cancer status post radical prostatectomy 2014, COPD/quit smoking 7 years ago.  Per chart review patient lives with spouse.  1 level home 4 steps to entry.  Modified independent with cane prior to admission.  Presented 09/08/2024 with low back pain radiating to the left lower extremity.  X-rays and imaging revealed left-sided lumbar radiculopathy with large disc herniation/L4-5 spinal stenosis.  Underwent left-sided L4-5 transforaminal lumbar interbody fusion/posterior lateral fusion insertion of interbody device revision L4-5 decompression 09/08/2024 per Dr. Beuford.  Noted intraoperatively patient had developed hives swelling of tongue and lips along with hypotension critical care medicine consulted possible anaphylactic shock was started on epi drip and given Decadron .  He did require short-term intubation and transferred to the ICU.  He was extubated 9/11.  MRI T-spine with no acute findings.  MRI lumbar spine showed L4-5 PLIF moderate residual foraminal stenosis..  Since his surgery he had ongoing right leg weakness and numbness neurology was consulted for recommendations.  MRI of the brain showed no acute changes.  Neurology felt that exam findings consistent with either hypersensitivity to pain or mild component of nonorganic findings.  Recommendations were for outpatient EMG if right lower extremity numbness persists.  Therapy evaluations completed with brace when out of bed.  Therapy evaluations completed due to patient decreased functional mobility was admitted for a comprehensive rehab program.  Review of Systems  Constitutional:  Negative for fever.  HENT:  Negative for hearing loss.   Eyes:        Visual impairment left eye  Respiratory:  Negative for cough, shortness of  breath and wheezing.   Cardiovascular:  Negative for chest pain, palpitations and leg swelling.  Gastrointestinal:  Positive for constipation. Negative for heartburn and nausea.  Genitourinary:  Positive for urgency. Negative for dysuria, flank pain and hematuria.       Nocturia  Musculoskeletal:  Positive for back pain and myalgias.  Skin:  Negative for rash.  Neurological:  Positive for sensory change, weakness and headaches.  Psychiatric/Behavioral:         Anxiety  All other systems reviewed and are negative.  Past Medical History:  Diagnosis Date   Anxiety    new dx   Arthritis    lumbar   Atherosclerotic vascular disease    Cataract    COPD (chronic obstructive pulmonary disease) (HCC)    Diverticulosis    Elevated PSA    Headache(784.0)    MIGRAINES   Hypertension 2021   Iritis    CHRONIC IN LEFT EYE - SOME VISIAL IMPAIRMENT IN LEFT EYE   Neuromuscular disorder (HCC)    left leg/foot,pinched siactic nerve   Pain    PAIN LEFT HIP AND DOWN LT LEG WITH NUMBNESS IN LEFT LEG--PT STATES SCIATIC NERVE IMPINGEMENT - PT PLANS BACK IN THE NEAR SURGERY.   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   Renal cysts, acquired, bilateral 03/19/2013   several simple , CT   Urinary frequency    AND NOCTURIA   Past Surgical History:  Procedure Laterality Date   BACK SURGERY     CARDIAC CATHETERIZATION  03/10/2018   CATARACT EXTRACTION W/ INTRAOCULAR LENS IMPLANT Left    EYE SURGERY     RETINAL SURGERY LEFT EYE   HYDROCELE EXCISION  Left 03/05/2013   Procedure: HYDROCELECTOMY ADULT;  Surgeon: Donnice Gwenyth Brooks, MD;  Location: [facility];  Service: Urology;  Laterality: Left;   INCISIONAL HERNIA REPAIR N/A 06/18/2022   Procedure: REPAIR OF INTERNAL HERNIA;  Surgeon: Rubin Calamity, MD;  Location: [facility] OR;  Service: General;  Laterality: N/A;   INGUINAL HERNIA REPAIR Bilateral AGE 7   INSERTION OF MESH N/A 06/11/2022    Procedure: INSERTION OF MESH;  Surgeon: Rubin Calamity, MD;  Location: [facility] OR;  Service: General;  Laterality: N/A;   INSERTION OF MESH N/A 06/18/2022   Procedure: INSERTION OF MESH;  Surgeon: Rubin Calamity, MD;  Location: [facility] OR;  Service: General;  Laterality: N/A;   KYPHOPLASTY N/A 05/08/2023   Procedure: THORACIC SEVEN KYPHOPLASTY;  Surgeon: Beuford Anes, MD;  Location: [facility] OR;  Service: Orthopedics;  Laterality: N/A;   LAPAROSCOPY N/A 06/18/2022   Procedure: LAPAROSCOPY DIAGNOSTIC;  Surgeon: Rubin Calamity, MD;  Location: [facility] OR;  Service: General;  Laterality: N/A;   LYMPHADENECTOMY Bilateral 05/06/2013   Procedure: REDGIE;  Surgeon: Noretta Ferrara, MD;  Location: [facility] ORS;  Service: Urology;  Laterality: Bilateral;   PROSTATE BIOPSY N/A 03/05/2013   Procedure: PROSTATE BIOPSY and ultrasound;  Surgeon: Donnice Gwenyth Brooks, MD;  Location: [facility];  Service: Urology;  Laterality: N/A;   REMOVAL BURSA SAC, LEFT ELBOW  1996   ROBOT ASSISTED LAPAROSCOPIC RADICAL PROSTATECTOMY N/A 05/06/2013   Procedure: ROBOTIC ASSISTED LAPAROSCOPIC RADICAL PROSTATECTOMY LEVEL 3;  Surgeon: Noretta Ferrara, MD;  Location: [facility] OR[facility];  Service: Urology;  Laterality: N/A;   TONSILLECTOMY     as achild   TRANSFORAMINAL LUMBAR INTERBODY FUSION (TLIF) WITH PEDICLE SCREW FIXATION 1 LEVEL Left 09/08/2024   Procedure: LEFT-SIDED LUMBAR 4- LUMBAR 5 TRANSFORAMINAL LUMBAR INTERBODY FUSION AND DECOMPRESSION WITH INSTRUMENTATION AND ALLOGRAFT;  Surgeon: Beuford Anes, MD;  Location: [facility] OR;  Service: Orthopedics;  Laterality: Left;  LEFT-SIDED LUMBAR 4- LUMBAR 5 TRANSFORAMINAL LUMBAR INTERBODY FUSION AND DECOMPRESSION WITH INSTRUMENTATION AND ALLOGRAFT   XI ROBOTIC ASSISTED VENTRAL HERNIA N/A 06/11/2022   Procedure: ROBOTIC INCISIONAL HERNIA REPAIR WITH MESH;  Surgeon: Rubin Calamity, MD;  Location: [facility] OR;  Service: General;  Laterality: N/A;   Family History  Problem Relation Age of Onset    Cancer Father        prostate cancer   Cancer Paternal Grandfather        prostate   Colon cancer Neg Hx    Colon polyps Neg Hx    Esophageal cancer Neg Hx    Rectal cancer Neg Hx    Stomach cancer Neg Hx    Social History:  reports that he quit smoking about 7 years ago. His smoking use included cigarettes. He started smoking about 54 years ago. He has a 94 pack-year smoking history. He has never used smokeless tobacco. He reports current alcohol use of about 14.0 standard drinks of alcohol per week. He reports that he does not use drugs. Allergies:  Allergies  Allergen Reactions   Ancef  [Cefazolin ] Anaphylaxis   Medications Prior to Admission  Medication Sig Dispense Refill   cholecalciferol (VITAMIN D3) 25 MCG (1000 UNIT) tablet Take 1,000 Units by mouth daily.     diphenhydramine -acetaminophen  (TYLENOL  PM EXTRA STRENGTH) 25-500 MG TABS tablet Take 2 tablets by mouth at bedtime.     gabapentin  (NEURONTIN ) 300 MG capsule Take 600 mg by mouth 2 (two) times daily.     ipratropium (ATROVENT ) 0.03 % nasal spray Place 2 sprays into both nostrils 2 (  two) times daily.     pravastatin  (PRAVACHOL ) 20 MG tablet Take 20 mg by mouth in the morning.     TRELEGY ELLIPTA  100-62.5-25 MCG/ACT AEPB USE 1 INHALATION BY MOUTH INTO  THE LUNGS ONCE DAILY AT THE SAME TIME EACH DAY 180 each 3   albuterol  (VENTOLIN  HFA) 108 (90 Base) MCG/ACT inhaler USE 1-2 PUFFS EVERY 6 HOURS AS NEEDED FOR WHEEZING OR SHORTNESS OF BREATH. 8.5 g 2   Coal Tar Extract [phone] PSORIASIS MEDICATED EX) Apply 1 Application topically 3 (three) times daily as needed (psoriasis).        Home: Home Living Family/patient expects to be discharged to:: Private residence Living Arrangements: Spouse/significant other Available Help at Discharge: Family, Available 24 hours/day Type of Home: House Home Access: Stairs to enter [employer] of Steps: 4 Entrance Stairs-Rails: Can reach both Home Layout: One level Bathroom  Shower/Tub: Health visitor: Handicapped height Bathroom Accessibility: Yes Home Equipment: Agricultural consultant (2 wheels), Shower seat, Grab bars - tub/shower, Medical laboratory scientific officer - single point, Adaptive equipment, Hand held shower head, Other (comment) (hurry cane) Adaptive Equipment: Reacher  Lives With: Spouse   Functional History: Prior Function Prior Level of Function : Independent/Modified Independent, Driving Mobility Comments: mod I with hurry cane ADLs Comments: ind  Functional Status:  Mobility: Bed Mobility Overal bed mobility: Needs Assistance Bed Mobility: Sidelying to Sit Rolling: Contact guard assist Sidelying to sit: Used rails, Contact guard assist Sit to sidelying: Min assist, Used rails, HOB elevated General bed mobility comments: increased time Transfers Overall transfer level: Needs assistance Equipment used: Rolling walker (2 wheels) Transfers: Sit to/from Stand, Bed to chair/wheelchair/BSC Sit to Stand: Mod assist Bed to/from chair/wheelchair/BSC transfer type:: Step pivot Step pivot transfers: Min assist General transfer comment: STS from EOB and recliner with mod A for power up and anterior weight shift. Cues for hand placement. Slow steps with pt sliding RLE due to foot drop and weakness Ambulation/Gait Ambulation/Gait assistance: Mod assist, +2 physical assistance, +2 safety/equipment Gait Distance (Feet): 4 Feet Assistive device: Rolling walker (2 wheels) Gait Pattern/deviations: Step-through pattern, Decreased stride length, Trunk flexed, Decreased dorsiflexion - right General Gait Details: unable to progress due to dizziness Gait velocity: decr    ADL: ADL Overall ADL's : Needs assistance/impaired Eating/Feeding: Sitting, Set up Grooming: Sitting, Set up, Oral care Upper Body Bathing: Sitting, Set up Lower Body Bathing: Moderate assistance, Sitting/lateral leans Upper Body Dressing : Sitting, Minimal assistance Lower Body Dressing: Total  assistance, +2 for physical assistance, Sit to/from stand Toilet Transfer: Moderate assistance, +2 for physical assistance, Ambulation, Rolling walker (2 wheels) Toilet Transfer Details (indicate cue type and reason): simulated side stepping at EOB Toileting- Clothing Manipulation and Hygiene: Moderate assistance, +2 for safety/equipment Functional mobility during ADLs: Moderate assistance, Minimal assistance, +2 for physical assistance  Cognition: Cognition Orientation Level: Oriented X4 Cognition Arousal: Alert Behavior During Therapy: WFL for tasks assessed/performed  Physical Exam: Blood pressure (!) 141/91, pulse (!) 101, temperature 97.6 F (36.4 C), temperature source Oral, resp. rate 19, height 5' 9 (1.753 m), weight 93.3 kg, SpO2 95%. Physical Exam Neurological:     Comments: Patient is alert oriented x 3 following commands.     Results for orders placed or performed during the hospital encounter of 09/08/24 (from the past 48 hours)  Glucose, capillary     Status: Abnormal   Collection Time: 09/13/24 11:47 AM  Result Value Ref Range   Glucose-Capillary 111 (H) 70 - 99 mg/dL    Comment: Glucose reference  range applies only to samples taken after fasting for at least 8 hours.   Comment 1 Notify RN   Glucose, capillary     Status: Abnormal   Collection Time: 09/13/24  4:28 PM  Result Value Ref Range   Glucose-Capillary 149 (H) 70 - 99 mg/dL    Comment: Glucose reference range applies only to samples taken after fasting for at least 8 hours.   Comment 1 Notify RN   Glucose, capillary     Status: Abnormal   Collection Time: 09/13/24  9:26 PM  Result Value Ref Range   Glucose-Capillary 149 (H) 70 - 99 mg/dL    Comment: Glucose reference range applies only to samples taken after fasting for at least 8 hours.   MR BRAIN WO CONTRAST Result Date: 09/14/2024 CLINICAL DATA:  Right leg weakness and numbness EXAM: MRI HEAD WITHOUT CONTRAST TECHNIQUE: Multiplanar, multiecho pulse  sequences of the brain and surrounding structures were obtained without intravenous contrast. COMPARISON:  None Available. FINDINGS: MRI brain: The brain volume is normal. The signal in the brain parenchyma is normal. There is no acute or chronic infarct. The ventricles are normal. No mass lesion. There are normal flow signals in the carotid arteries and basilar artery. No significant bone marrow signal abnormality. Incidental left mastoid effusion. IMPRESSION: No acute infarct or other significant abnormality Electronically Signed   By: Nancyann Burns M.D.   On: 09/14/2024 10:33   MR Lumbar Spine W Wo Contrast Result Date: 09/14/2024 CLINICAL DATA:  Low back pain, prior surgery, new symptoms EXAM: MRI LUMBAR SPINE WITHOUT AND WITH CONTRAST TECHNIQUE: Multiplanar and multiecho pulse sequences of the lumbar spine were obtained without and with intravenous contrast. CONTRAST:  9mL GADAVIST  GADOBUTROL  1 MMOL/ML IV SOLN COMPARISON:  None Available. FINDINGS: Segmentation:  Standard. Alignment:  Normal. Vertebrae: No fracture, evidence of discitis, or bone lesion. L5-S1 interbody fusion. L4-L5 PLIF. Conus medullaris and cauda equina: Conus extends to the L1-L2 level. Conus and cauda equina appear normal. Paraspinal and other soft tissues: Partially imaged left renal cyst. Disc levels: T12-L1: No significant disc protrusion, foraminal stenosis, or canal stenosis. L1-L2: No significant disc protrusion, foraminal stenosis, or canal stenosis. L2-L3: Right eccentric disc bulging. Mild right foraminal stenosis. Mild right subarticular recess stenosis without significant central canal stenosis. L3-L4: Disc bulging, facet arthropathy and ligamentum thickening. Resulting mild canal stenosis and mild bilateral subarticular recess stenosis. Moderate left and mild right foraminal stenosis. L4-L5: PLIF with metallic artifact that limits assessment but there appears to be improved left foraminal stenosis with some enhancing  granulation tissue in the left foramen and probably at least moderate foraminal stenosis. Right foramen and canal are patent. L5-S1: Interbody fusion. Endplate spurring contributes to moderate left and mild right foraminal stenosis. Patent canal. IMPRESSION: 1. At L4-L5,PLIF with metallic artifact that limits assessment but there appears to be improved left foraminal stenosis with probably at least moderate residual foraminal stenosis. 2. At L5-S1, similar moderate left and mild right foraminal stenosis. Electronically Signed   By: Gilmore GORMAN Molt M.D.   On: 09/14/2024 00:52   MR THORACIC SPINE WO CONTRAST Result Date: 09/13/2024 EXAM: MRI THORACIC SPINE WITHOUT INTRAVENOUS CONTRAST 09/13/2024 01:54:07 PM TECHNIQUE: Multiplanar multisequence MRI of the thoracic spine was performed without the administration of intravenous contrast. COMPARISON: None available. CLINICAL HISTORY: Mid-back pain. FINDINGS: BONES AND ALIGNMENT: There is straightening of the normal thoracic kyphosis. Chronic compression deformity of T7 with up to 50% height loss in the anterior and central aspect of the vertebral  body with findings suggestive of kyphoplasty. There is mild anterior wedging of the T11 and T12 vertebral bodies without edema, which may be physiologic versus sequelae of mild trauma. Chronic compression deformity. The vertebral body heights are otherwise maintained. There is no evidence of acute fracture in the thoracic spine. SPINAL CORD: Normal spinal cord volume. No cord compression or cord signal abnormality. SOFT TISSUES: Unremarkable. DEGENERATIVE CHANGES: There are degenerative changes at the costovertebral articulations at multiple levels. There is no evidence of large disc herniation or high-grade spinal canal stenosis in the thoracic spine. There is facet arthrosis at multiple levels in the thoracic spine. Mild foraminal stenosis on the left at T6-7. Moderate right and mild left foraminal stenosis at T10-11.  IMPRESSION: 1. No acute findings. 2. 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 3. Mild anterior wedging of the T11 and T12 vertebral bodies without edema, which may be physiologic versus sequelae of mild chronic compression deformity. 4. Degenerative changes as above. Mild foraminal stenosis on the left at T6-7 and moderate right and mild left foraminal stenosis at T10-11. Electronically signed by: Donnice Mania MD 09/13/2024 02:39 PM EDT RP Workstation: [id number]      Blood pressure (!) 141/91, pulse (!) 101, temperature 97.6 F (36.4 C), temperature source Oral, resp. rate 19, height 5' 9 (1.753 m), weight 93.3 kg, SpO2 95%.  Medical Problem List and Plan: 1. 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  Status post L4-5 transforaminal interbody fusion/posterior lateral fusion insertion interbody device L4-5 decompression 09/08/2024 per Dr. Beuford.  Back brace when out of bed  -patient may *** shower  -ELOS/Goals: *** 2.  Antithrombotics: -DVT/anticoagulation:  Mechanical: Antiembolism stockings, thigh (TED hose) Bilateral lower extremities.  Check vascular study  -antiplatelet therapy: N/A 3. Pain Management: Neurontin  600 mg 3 times daily, Robaxin  and oxycodone  as needed 4. Mood/Behavior/Sleep: Ambien  as needed  -antipsychotic agents: N/A 5. Neuropsych/cognition: This patient is capable of making decisions on his own behalf. 6. Skin/Wound Care: Routine skin checks 7. Fluids/Electrolytes/Nutrition: Routine in and outs with follow-up chemistries. 8.  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  Patient responded to epi drip and Decadron . 9.  Hyperlipidemia.  Pravachol  10.  COPD.  Remote tobacco use.  Continue inhalers as directed.  Check oxygen  saturations every shift 11.  History of prostate cancer.  Status post prostatectomy.  Flomax  0.4 mg daily. 12.  History of detached  retina left eye.  Follow-up outpatient ophthalmology  Toribio JINNY Pitch, PA-C 09/15/2024

## 2024-09-15 NOTE — Progress Notes (Signed)
 Physical Therapy Treatment Patient Details Name: David King MRN: [id number] DOB: [birth year] Today's Date: 09/15/2024   History of Present Illness 68 y.o. male presents to [facility] 09/08/24 for elective L4-L5 decompression surgery complicated with anaphylactic shock likely 2/2 cephalosporins. Intubated and ICU stay from 9/10-9/11. PMHx: COPD, CAD, PAD, tobacco use, lumbar radiculopathy, detached retina L eye   PT Comments  Pt able to recall all spinal precautions with MinA needed to don spinal brace. Able to progress in today's session to ambulating 15 ft with MinA +2 for safety with use of RW. No R ankle AROM noted during gait with pt swinging R LE forwards to advance. After a short seated rest break, pt was then able to stand for a few minutes to perform grooming tasks with MinA for slight posterior sway. Pt is looking forward to continuing to work with therapy and to go to rehab. Acute PT to follow.     If plan is discharge home, recommend the following: A lot of help with walking and/or transfers;A lot of help with bathing/dressing/bathroom;Assistance with cooking/housework;Assist for transportation;Help with stairs or ramp for entrance   Can travel by private vehicle      No  Equipment Recommendations  Wheelchair (measurements PT);Wheelchair cushion (measurements PT);BSC/3in1       Precautions / Restrictions Precautions Precautions: Fall;Back Precaution Booklet Issued: Yes (comment) Recall of Precautions/Restrictions: Intact Required Braces or Orthoses: Spinal Brace Spinal Brace: Thoracolumbosacral orthotic;Applied in sitting position Restrictions Weight Bearing Restrictions Per Provider Order: No     Mobility  Bed Mobility Overal bed mobility: Needs Assistance Bed Mobility: Rolling, Sidelying to Sit Rolling: Contact guard assist Sidelying to sit: Contact guard assist       General bed mobility comments: cueing for log roll technique    Transfers Overall transfer level:  Needs assistance Equipment used: Rolling walker (2 wheels) Transfers: Sit to/from Stand Sit to Stand: Mod assist, Min assist, +2 physical assistance    General transfer comment: increased assist from EOB, min assist +2 from recliner. cueing for hand placement and technique, posture.    Ambulation/Gait Ambulation/Gait assistance: Min assist, +2 physical assistance, +2 safety/equipment Gait Distance (Feet): 16 Feet Assistive device: Rolling walker (2 wheels) Gait Pattern/deviations: Step-through pattern, Decreased stride length, Trunk flexed, Decreased dorsiflexion - right Gait velocity: decr     General Gait Details: no R ankle AROM with pt sliding R LE forwards to advance, MinA for stability with +2 for safety    Balance Overall balance assessment: Needs assistance Sitting-balance support: Bilateral upper extremity supported, No upper extremity supported, Feet supported Sitting balance-Leahy Scale: Fair Sitting balance - Comments: sitting EOB preference to BUE support but able to sit without UE support   Standing balance support: Bilateral upper extremity supported, During functional activity, Reliant on assistive device for balance, No upper extremity supported Standing balance-Leahy Scale: Poor Standing balance comment: reliant on RW, able to stand statically during grooming at sink with min posterior sway at times       Communication Communication Communication: No apparent difficulties  Cognition Arousal: Alert Behavior During Therapy: WFL for tasks assessed/performed   PT - Cognitive impairments: No apparent impairments    Following commands: Intact      Cueing Cueing Techniques: Verbal cues     General Comments General comments (skin integrity, edema, etc.): VSS on RA      Pertinent Vitals/Pain Pain Assessment Pain Assessment: Faces Faces Pain Scale: Hurts even more Pain Location: low back, RLE Pain Descriptors / Indicators: Aching, Discomfort, Sore  Pain  Intervention(s): Limited activity within patient's tolerance, Monitored during session, Repositioned     PT Goals (current goals can now be found in the care plan section) Acute Rehab PT Goals Patient Stated Goal: to gain independence with mobility PT Goal Formulation: With patient Time For Goal Achievement: 09/24/24 Potential to Achieve Goals: Good Progress towards PT goals: Progressing toward goals    Frequency    Min 3X/week           Co-evaluation   Reason for Co-Treatment: For patient/therapist safety;To address functional/ADL transfers PT goals addressed during session: Mobility/safety with mobility;Balance;Proper use of DME OT goals addressed during session: ADL's and self-care      AM-PAC PT 6 Clicks Mobility   Outcome Measure  Help needed turning from your back to your side while in a flat bed without using bedrails?: A Little Help needed moving from lying on your back to sitting on the side of a flat bed without using bedrails?: A Little Help needed moving to and from a bed to a chair (including a wheelchair)?: Total Help needed standing up from a chair using your arms (e.g., wheelchair or bedside chair)?: Total Help needed to walk in hospital room?: Total Help needed climbing 3-5 steps with a railing? : Total 6 Click Score: 10    End of Session Equipment Utilized During Treatment: Gait belt;Back brace Activity Tolerance: Patient tolerated treatment well Patient left: in chair;with call bell/phone within reach;with chair alarm set;with family/visitor present Nurse Communication: Mobility status PT Visit Diagnosis: Unsteadiness on feet (R26.81);Other abnormalities of gait and mobility (R26.89);Muscle weakness (generalized) (M62.81);Difficulty in walking, not elsewhere classified (R26.2)     Time: 8957-8892 PT Time Calculation (min) (ACUTE ONLY): 25 min  Charges:    $Gait Training: 8-22 mins PT General Charges $$ ACUTE PT VISIT: 1 Visit                     Kate ORN, PT, DPT Secure Chat Preferred  Rehab Office [phone]   Kate BRAVO Wendolyn 09/15/2024, 1:07 PM

## 2024-09-16 ENCOUNTER — Inpatient Hospital Stay (HOSPITAL_COMMUNITY)

## 2024-09-16 DIAGNOSIS — M7989 Other specified soft tissue disorders: Secondary | ICD-10-CM | POA: Diagnosis not present

## 2024-09-16 DIAGNOSIS — M5416 Radiculopathy, lumbar region: Secondary | ICD-10-CM | POA: Diagnosis not present

## 2024-09-16 LAB — COMPREHENSIVE METABOLIC PANEL WITH GFR
ALT: 161 U/L — ABNORMAL HIGH (ref 0–44)
AST: 125 U/L — ABNORMAL HIGH (ref 15–41)
Albumin: 3.1 g/dL — ABNORMAL LOW (ref 3.5–5.0)
Alkaline Phosphatase: 59 U/L (ref 38–126)
Anion gap: 14 (ref 5–15)
BUN: 26 mg/dL — ABNORMAL HIGH (ref 8–23)
CO2: 23 mmol/L (ref 22–32)
Calcium: 8.8 mg/dL — ABNORMAL LOW (ref 8.9–10.3)
Chloride: 97 mmol/L — ABNORMAL LOW (ref 98–111)
Creatinine, Ser: 0.83 mg/dL (ref 0.61–1.24)
GFR, Estimated: >60 mL/min (ref >60)
Glucose, Bld: 93 mg/dL (ref 70–99)
Potassium: 3.8 mmol/L (ref 3.5–5.1)
Sodium: 134 mmol/L — ABNORMAL LOW (ref 135–145)
Total Bilirubin: 1.1 mg/dL (ref 0.0–1.2)
Total Protein: 6.5 g/dL (ref 6.5–8.1)

## 2024-09-16 LAB — CBC WITH DIFFERENTIAL/PLATELET
Abs Immature Granulocytes: 0.28 K/uL — ABNORMAL HIGH (ref 0.00–0.07)
Basophils Absolute: 0 K/uL (ref 0.0–0.1)
Basophils Relative: 0 %
Eosinophils Absolute: 0 K/uL (ref 0.0–0.5)
Eosinophils Relative: 0 %
HCT: 37.6 % — ABNORMAL LOW (ref 39.0–52.0)
Hemoglobin: 12.8 g/dL — ABNORMAL LOW (ref 13.0–17.0)
Immature Granulocytes: 2 %
Lymphocytes Relative: 19 %
Lymphs Abs: 2.2 K/uL (ref 0.7–4.0)
MCH: 33.2 pg (ref 26.0–34.0)
MCHC: 34 g/dL (ref 30.0–36.0)
MCV: 97.4 fL (ref 80.0–100.0)
Monocytes Absolute: 1.1 K/uL — ABNORMAL HIGH (ref 0.1–1.0)
Monocytes Relative: 10 %
Neutro Abs: 8 K/uL — ABNORMAL HIGH (ref 1.7–7.7)
Neutrophils Relative %: 69 %
Platelets: 236 K/uL (ref 150–400)
RBC: 3.86 MIL/uL — ABNORMAL LOW (ref 4.22–5.81)
RDW: 13.4 % (ref 11.5–15.5)
WBC: 11.6 K/uL — ABNORMAL HIGH (ref 4.0–10.5)
nRBC: 0 % (ref 0.0–0.2)

## 2024-09-16 MED ORDER — LIDOCAINE HCL URETHRAL/MUCOSAL 2 % EX GEL
1.0000 | CUTANEOUS | Status: DC | PRN
  Filled 2024-09-16: qty 6

## 2024-09-16 MED ORDER — GABAPENTIN 300 MG PO CAPS
900.0000 mg | ORAL_CAPSULE | Freq: Three times a day (TID) | ORAL | Status: DC
Start: 2024-09-16 — End: 2024-09-18
  Administered 2024-09-16 – 2024-09-18 (×7): 900 mg via ORAL
  Filled 2024-09-16 (×8): qty 3

## 2024-09-16 MED ORDER — ACETAMINOPHEN 325 MG PO TABS: 325.0000 mg | ORAL_TABLET | ORAL | Status: DC | PRN

## 2024-09-16 MED ORDER — VITAMIN C 500 MG PO TABS
1000.0000 mg | ORAL_TABLET | Freq: Every day | ORAL | Status: DC
  Administered 2024-09-16 – 2024-09-23 (×8): 1000 mg via ORAL
  Filled 2024-09-16 (×10): qty 2

## 2024-09-16 MED ORDER — ZINC SULFATE 220 (50 ZN) MG PO CAPS
220.0000 mg | ORAL_CAPSULE | Freq: Every day | ORAL | Status: DC
  Administered 2024-09-16 – 2024-09-23 (×8): 220 mg via ORAL
  Filled 2024-09-16 (×10): qty 1

## 2024-09-16 MED ORDER — METHOCARBAMOL 1000 MG/10ML IJ SOLN
500.0000 mg | Freq: Four times a day (QID) | INTRAMUSCULAR | Status: DC
  Filled 2024-09-16 (×36): qty 5

## 2024-09-16 MED ORDER — OXYCODONE HCL 5 MG PO TABS
15.0000 mg | ORAL_TABLET | ORAL | Status: DC | PRN
  Administered 2024-09-17 – 2024-09-23 (×16): 15 mg via ORAL
  Filled 2024-09-16 (×17): qty 3

## 2024-09-16 MED ORDER — ENSURE PLUS HIGH PROTEIN PO LIQD: 237.0000 mL | Freq: Two times a day (BID) | ORAL | Status: DC

## 2024-09-16 MED ORDER — ACETAMINOPHEN 650 MG RE SUPP: 650.0000 mg | RECTAL | Status: DC | PRN

## 2024-09-16 MED ORDER — LIDOCAINE 5 % EX PTCH
2.0000 | MEDICATED_PATCH | CUTANEOUS | Status: DC
  Administered 2024-09-16 – 2024-09-22 (×4): 2 via TRANSDERMAL
  Filled 2024-09-16 (×8): qty 2

## 2024-09-16 MED ORDER — METHOCARBAMOL 500 MG PO TABS
1000.0000 mg | ORAL_TABLET | Freq: Four times a day (QID) | ORAL | Status: DC
  Administered 2024-09-16 – 2024-09-23 (×31): 1000 mg via ORAL
  Filled 2024-09-16 (×34): qty 2

## 2024-09-16 MED ORDER — DULOXETINE HCL 30 MG PO CPEP
30.0000 mg | ORAL_CAPSULE | Freq: Every day | ORAL | Status: DC
  Administered 2024-09-16 – 2024-09-23 (×8): 30 mg via ORAL
  Filled 2024-09-16 (×8): qty 1

## 2024-09-16 MED ORDER — OXYCODONE HCL 5 MG PO TABS
15.0000 mg | ORAL_TABLET | Freq: Once | ORAL | Status: AC
  Administered 2024-09-16: 15 mg via ORAL
  Filled 2024-09-16: qty 3

## 2024-09-16 MED ORDER — OXYCODONE HCL 5 MG PO TABS
10.0000 mg | ORAL_TABLET | ORAL | Status: DC | PRN
  Administered 2024-09-16 – 2024-09-23 (×3): 10 mg via ORAL
  Filled 2024-09-16 (×2): qty 2

## 2024-09-16 NOTE — Progress Notes (Signed)
 Wound Plan  Wounds present:  1. Pressure Injury Heel Right Deep Tissue Pressure Injury - Purple or maroon localized area of discolored intact skin or blood-filled blister due to damage of underlying soft tissue from pressure and/or shear.  2.Pressure Injury Buttocks Bilateral;Right Deep Tissue Pressure Injury - Purple or maroon localized area of discolored intact skin or blood-filled blister due to damage of underlying soft tissue from pressure and/or shear.   Interventions:   WOC  Consult Dietician consult Regular diet 100 % Step skin care regimen: Cleaner, Moisturizer, Skin Protectant Routine, Daily, Mattress replacement Pressure redistribution chair pad if out of bed and has any type of wound or MASD on the sacrum/coccyx/buttocks, or if patient will be elevating the feet while in the chair  Prevalon boot  Wound care Routine, Until discontinued, Starting on Thu 09/16/24 at 1154, Until Specified Gluteal cleft and R heel: Cleanse with warm water  or saline, pat dry. Apply foam dressing, change every 3 days or PRN.  Braden Score: ***  Sensory: ***  Moisture: ***  Activity: ***  Mobility: ***  Nutrition: ***  Friction: ***

## 2024-09-16 NOTE — Progress Notes (Signed)
 Inpatient Rehabilitation Admission Medication Review by a Pharmacist  A complete drug regimen review was completed for this patient to identify any potential clinically significant medication issues.  High Risk Drug Classes Is patient taking? Indication by Medication  Antipsychotic No   Anticoagulant No   Antibiotic No   Opioid Yes Percocet prn pain  Antiplatelet No   Hypoglycemics/insulin  No   Vasoactive Medication Yes Tamsulosin  - BPH  Chemotherapy No   Other Yes Methocarbamol  prn spasms Ondansetron  prn N/V Zolpidem  prn sleep  Albuterol , arformoterol , budesonide , revefenacin  - COPD  Gabapentin  - neuropathy Pravastatin  - HLD     Type of Medication Issue Identified Description of Issue Recommendation(s)  Drug Interaction(s) (clinically significant)     Duplicate Therapy     Allergy      No Medication Administration End Date     Incorrect Dose     Additional Drug Therapy Needed     Significant med changes from prior encounter (inform family/care partners about these prior to discharge).    Other       Clinically significant medication issues were identified that warrant physician communication and completion of prescribed/recommended actions by midnight of the next day:  No  Name of provider notified for urgent issues identified:   Provider Method of Notification:     Pharmacist comments: None  Time spent performing this drug regimen review (minutes):  20 minutes  Thank you. Olam Monte, PharmD

## 2024-09-16 NOTE — Progress Notes (Signed)
 Inpatient Rehabilitation Center Individual Statement of Services  Patient Name:  David King  Date:  09/16/2024  Welcome to the Inpatient Rehabilitation Center.  Our goal is to provide you with an individualized program based on your diagnosis and situation, designed to meet your specific needs.  With this comprehensive rehabilitation program, you will be expected to participate in at least 3 hours of rehabilitation therapies Monday-Friday, with modified therapy programming on the weekends.  Your rehabilitation program will include the following services:  Physical Therapy (PT), Occupational Therapy (OT), 24 hour per day rehabilitation nursing, Therapeutic Recreaction (TR), Neuropsychology, Care Coordinator, Rehabilitation Medicine, Nutrition Services, and Pharmacy Services  Weekly team conferences will be held on Tuesday to discuss your progress.  Your Inpatient Rehabilitation Care Coordinator will talk with you frequently to get your input and to update you on team discussions.  Team conferences with you and your family in attendance may also be held.  Expected length of stay: 10-12 days  Overall anticipated outcome: Independent -supervision with tolieting  Depending on your progress and recovery, your program may change. Your Inpatient Rehabilitation Care Coordinator will coordinate services and will keep you informed of any changes. Your Inpatient Rehabilitation Care Coordinator's name and contact numbers are listed  below.  The following services may also be recommended but are not provided by the Inpatient Rehabilitation Center:  Driving Evaluations Home Health Rehabiltiation Services Outpatient Rehabilitation Services    Arrangements will be made to provide these services after discharge if needed.  Arrangements include referral to agencies that provide these services.  Your insurance has been verified to be:  [facility] medicare Your primary doctor is:  Carlin Gull  Pertinent  information will be shared with your doctor and your insurance company.  Inpatient Rehabilitation Care Coordinator:  Rhoda Clement, KEN [phone] or ELIGAH BASQUES  Information discussed with and copy given to patient by: Clement Asberry MATSU, 09/16/2024, 10:53 AM

## 2024-09-16 NOTE — Progress Notes (Addendum)
 PROGRESS NOTE   Subjective/Complaints:    Pt reports pain is 11/10- his R foot feels like it's a piece of coal on fire-  He reports hx of 13 surgeries but this one is the worst he's had.  Pain meds only last 15-30 minutes'- and wants IV pain meds- I explained that we don't do IV pain meds here in CIR.    LBM x2 yesterday.. Voiding OK Ate 100% tray.    ROS: Per HPI Pt denies SOB, abd pain, CP, N/V/C/D, and vision changes  Pain uncontrolled (+)  Objective:   MR BRAIN WO CONTRAST Result Date: 09/14/2024 CLINICAL DATA:  Right leg weakness and numbness EXAM: MRI HEAD WITHOUT CONTRAST TECHNIQUE: Multiplanar, multiecho pulse sequences of the brain and surrounding structures were obtained without intravenous contrast. COMPARISON:  None Available. FINDINGS: MRI brain: The brain volume is normal. The signal in the brain parenchyma is normal. There is no acute or chronic infarct. The ventricles are normal. No mass lesion. There are normal flow signals in the carotid arteries and basilar artery. No significant bone marrow signal abnormality. Incidental left mastoid effusion. IMPRESSION: No acute infarct or other significant abnormality Electronically Signed   By: Nancyann Burns M.D.   On: 09/14/2024 10:33   Recent Labs    09/16/24 0557  WBC 11.6*  HGB 12.8*  HCT 37.6*  PLT 236   Recent Labs    09/16/24 0557  NA 134*  K 3.8  CL 97*  CO2 23  GLUCOSE 93  BUN 26*  CREATININE 0.83  CALCIUM 8.8*    Intake/Output Summary (Last 24 hours) at 09/16/2024 0844 Last data filed at 09/16/2024 0757 Gross per 24 hour  Intake --  Output 1725 ml  Net -1725 ml        Physical Exam: Vital Signs Blood pressure 130/81, pulse 73, temperature 97.9 F (36.6 C), resp. rate 18, height 5' 9 (1.753 m), weight 83.2 kg, SpO2 92%.    General: awake, alert, screaming in pain; NAD HENT: conjugate gaze; oropharynx moist CV: regular rate and  rhythm; no JVD Pulmonary: CTA B/L; no W/R/R- good air movement GI: soft, NT, ND, (+)BS Psychiatric: inappropriate, screaming help me, help me and no one is trying to help me.  Neurological: Ox3 Neurological:     Comments: Patient is alert oriented x 3 following commands. Right lower extremity strength is 4/5 proximally and 2/5 distally, LLE 4/5 throughout, decreased sensation in right foot   Assessment/Plan: 1. Functional deficits which require 3+ hours per day of interdisciplinary therapy in a comprehensive inpatient rehab setting. Physiatrist is providing close team supervision and 24 hour management of active medical problems listed below. Physiatrist and rehab team continue to assess barriers to discharge/monitor patient progress toward functional and medical goals  Care Tool:  Bathing              Bathing assist       Upper Body Dressing/Undressing Upper body dressing        Upper body assist      Lower Body Dressing/Undressing Lower body dressing            Lower body assist  Toileting Toileting    Toileting assist       Transfers Chair/bed transfer  Transfers assist           Locomotion Ambulation   Ambulation assist              Walk 10 feet activity   Assist           Walk 50 feet activity   Assist           Walk 150 feet activity   Assist           Walk 10 feet on uneven surface  activity   Assist           Wheelchair     Assist               Wheelchair 50 feet with 2 turns activity    Assist            Wheelchair 150 feet activity     Assist          Blood pressure 130/81, pulse 73, temperature 97.9 F (36.6 C), resp. rate 18, height 5' 9 (1.753 m), weight 83.2 kg, SpO2 92%.  Medical Problem List and Plan: 1. 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  Status post L4-5 transforaminal interbody  fusion/posterior lateral fusion insertion interbody device L4-5 decompression 09/08/2024 per Dr. Beuford.  Back brace when out of bed             -patient may shower             -ELOS/Goals: 10-14 days              First day of evaluations- con't CIR PT and OT 2.  Antithrombotics: -DVT/anticoagulation:  Mechanical: Antiembolism stockings, thigh (TED hose) Bilateral lower extremities.  Check vascular study             -antiplatelet therapy: N/A 3. Pain Management: Neurontin  600 mg 3 times daily, Robaxin  and oxycodone  as needed  9/18- Gave a 1x dose of Oxy 15 mg, Increased his Oxy to 10-15 mg q4 hours prn for pain; changed Robaxin  to 1000 mg QID, increased Gabapentin  to 900 mg TID.Also added Lidoderm   for nerve pain on RLE.   4. Anxiety: would benefit from neuropsych eval, magnesium  supplement started.  Ambien  as needed  9/18- Will start Duloxetine  for nerve pain 30 mg QHS             -antipsychotic agents: N/A 5. Neuropsych/cognition: This patient is capable of making decisions on his own behalf. 6. Tachycardia: magnesium  supplement started   7. Fluids/Electrolytes/Nutrition: Routine in and outs with follow-up chemistries.   8.  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  Patient responded to epi drip and Decadron .   9.  Hyperlipidemia.  Pravachol    10.  COPD.  Remote tobacco use.  Continue inhalers as directed.  Check oxygen  saturations every shift   11.  History of prostate cancer.  Status post prostatectomy.  Continue Flomax  0.4 mg daily.   12.  History of detached retina left eye.  Follow-up outpatient ophthalmology   13. Transaminitis  9/18- AST 125 and ALT 161- will stop Pravachol  20 mg daily and decrease Tylenol  to 325 mg q6 hours prn from 650 mg. Last LFTs were normal 7-8 days ago.   14. Azotemia  9/18- BUN up to 26 from 12- will push fluids and recheck in AM.   15. Mild Leukocytosis  9/18- will recheck  in AM- has had decadron - no Sx's of UTI/URI.  16. R  heel DTI  9/18- Prevalon ordered and pt has WOC consult ordered   I spent a total of 54   minutes on total care today- >50% coordination of care- due to  D/w  pt at length about pain and making multiple changes- also called nursing and spoke to them at length. Also spoke with team about need for neuropsych- addendum- also spoke to Nursing coordinator about DTI  LOS: 1 days A FACE TO FACE EVALUATION WAS PERFORMED  Corynne Scibilia 09/16/2024, 8:44 AM

## 2024-09-16 NOTE — Plan of Care (Signed)
  Problem: Consults Goal: RH SPINAL CORD INJURY PATIENT EDUCATION Description:  See Patient Education module for education specifics.  Outcome: Progressing   Problem: SCI BOWEL ELIMINATION Goal: RH STG MANAGE BOWEL WITH ASSISTANCE Description: STG Manage Bowel with supervision Assistance. Outcome: Progressing   Problem: SCI BLADDER ELIMINATION Goal: RH STG MANAGE BLADDER WITH ASSISTANCE Description: STG Manage Bladder With supervision Assistance Outcome: Progressing   Problem: RH SKIN INTEGRITY Goal: RH STG SKIN FREE OF INFECTION/BREAKDOWN Description: Manage skin free of infection/breakdown with supervision assistance Outcome: Progressing   Problem: RH SAFETY Goal: RH STG ADHERE TO SAFETY PRECAUTIONS W/ASSISTANCE/DEVICE Description: STG Adhere to Safety Precautions With supervision Assistance/Device. Outcome: Progressing   Problem: RH PAIN MANAGEMENT Goal: RH STG PAIN MANAGED AT OR BELOW PT'S PAIN GOAL Description: <4 w/ prns Outcome: Progressing   Problem: RH KNOWLEDGE DEFICIT SCI Goal: RH STG INCREASE KNOWLEDGE OF SELF CARE AFTER SCI Description: Manage increase knowledge of self care after  SCI with supervision assistance from spouse using educational materials provided Outcome: Progressing

## 2024-09-16 NOTE — Progress Notes (Signed)
 Inpatient Rehabilitation  Patient information reviewed and entered into eRehab system by [facility]. Karen Kays., CCC/SLP, PPS Coordinator.  Information including medical coding, functional ability and quality indicators will be reviewed and updated through discharge.

## 2024-09-16 NOTE — Progress Notes (Signed)
 Inpatient Rehabilitation Care Coordinator Assessment and Plan Patient Details  Name: David King MRN: [id number] Date of Birth: [birth year]  Today's Date: 09/16/2024  Hospital Problems: Principal Problem:   Lumbar radiculopathy  Past Medical History:  Past Medical History:  Diagnosis Date   Anxiety    new dx   Arthritis    lumbar   Atherosclerotic vascular disease    Cataract    COPD (chronic obstructive pulmonary disease) (HCC)    Diverticulosis    Elevated PSA    Headache(784.0)    MIGRAINES   Hypertension 2021   Iritis    CHRONIC IN LEFT EYE - SOME VISIAL IMPAIRMENT IN LEFT EYE   Neuromuscular disorder (HCC)    left leg/foot,pinched siactic nerve   Pain    PAIN LEFT HIP AND DOWN LT LEG WITH NUMBNESS IN LEFT LEG--PT STATES SCIATIC NERVE IMPINGEMENT - PT PLANS BACK IN THE NEAR SURGERY.   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   Renal cysts, acquired, bilateral 03/19/2013   several simple , CT   Urinary frequency    AND NOCTURIA   Past Surgical History:  Past Surgical History:  Procedure Laterality Date   BACK SURGERY     CARDIAC CATHETERIZATION  03/10/2018   CATARACT EXTRACTION W/ INTRAOCULAR LENS IMPLANT Left    EYE SURGERY     RETINAL SURGERY LEFT EYE   HYDROCELE EXCISION Left 03/05/2013   Procedure: HYDROCELECTOMY ADULT;  Surgeon: Donnice Gwenyth Brooks, MD;  Location: [facility];  Service: Urology;  Laterality: Left;   INCISIONAL HERNIA REPAIR N/A 06/18/2022   Procedure: REPAIR OF INTERNAL HERNIA;  Surgeon: Rubin Calamity, MD;  Location: [facility] OR;  Service: General;  Laterality: N/A;   INGUINAL HERNIA REPAIR Bilateral AGE 68   INSERTION OF MESH N/A 06/11/2022   Procedure: INSERTION OF MESH;  Surgeon: Rubin Calamity, MD;  Location: [facility] OR;  Service: General;  Laterality: N/A;   INSERTION OF MESH N/A 06/18/2022   Procedure: INSERTION OF MESH;  Surgeon: Rubin Calamity, MD;  Location: [facility] OR;   Service: General;  Laterality: N/A;   KYPHOPLASTY N/A 05/08/2023   Procedure: THORACIC SEVEN KYPHOPLASTY;  Surgeon: Beuford Anes, MD;  Location: [facility] OR;  Service: Orthopedics;  Laterality: N/A;   LAPAROSCOPY N/A 06/18/2022   Procedure: LAPAROSCOPY DIAGNOSTIC;  Surgeon: Rubin Calamity, MD;  Location: [facility] OR;  Service: General;  Laterality: N/A;   LYMPHADENECTOMY Bilateral 05/06/2013   Procedure: REDGIE;  Surgeon: Noretta Ferrara, MD;  Location: [facility] ORS;  Service: Urology;  Laterality: Bilateral;   PROSTATE BIOPSY N/A 03/05/2013   Procedure: PROSTATE BIOPSY and ultrasound;  Surgeon: Donnice Gwenyth Brooks, MD;  Location: [facility];  Service: Urology;  Laterality: N/A;   REMOVAL BURSA SAC, LEFT ELBOW  1996   ROBOT ASSISTED LAPAROSCOPIC RADICAL PROSTATECTOMY N/A 05/06/2013   Procedure: ROBOTIC ASSISTED LAPAROSCOPIC RADICAL PROSTATECTOMY LEVEL 3;  Surgeon: Noretta Ferrara, MD;  Location: [facility];  Service: Urology;  Laterality: N/A;   TONSILLECTOMY     as 68   TRANSFORAMINAL LUMBAR INTERBODY FUSION (TLIF) WITH PEDICLE SCREW FIXATION 1 LEVEL Left 09/08/2024   Procedure: LEFT-SIDED LUMBAR 4- LUMBAR 5 TRANSFORAMINAL LUMBAR INTERBODY FUSION AND DECOMPRESSION WITH INSTRUMENTATION AND ALLOGRAFT;  Surgeon: Beuford Anes, MD;  Location: [facility] OR;  Service: Orthopedics;  Laterality: Left;  LEFT-SIDED LUMBAR 4- LUMBAR 5 TRANSFORAMINAL LUMBAR INTERBODY FUSION AND DECOMPRESSION WITH INSTRUMENTATION AND ALLOGRAFT   XI ROBOTIC ASSISTED VENTRAL HERNIA N/A 06/11/2022   Procedure: ROBOTIC  INCISIONAL HERNIA REPAIR WITH MESH;  Surgeon: Rubin Calamity, MD;  Location: [facility] OR;  Service: General;  Laterality: N/A;   Social History:  reports that he quit smoking about 7 years ago. His smoking use included cigarettes. He started smoking about 54 years ago. He has a 94 pack-year smoking history. He has never used smokeless tobacco. He reports current alcohol use of about 14.0 standard drinks of alcohol per  week. He reports that he does not use drugs.  Family / Support Systems Marital Status: Married How Long?: 42 years Patient Roles: Spouse, Other (Comment) (friends) Spouse/Significant Other: Cheron [phone] Other Supports: Friends Anticipated Caregiver: Wife Ability/Limitations of Caregiver: Wife has been assisting pt for the past few months, she also assists a Network engineer and friend with grocery shopping, etc Caregiver Availability: 24/7 Family Dynamics: Close with wife and friends, he feels bad his wife has to assist him so much. He hopes he will be able to do more for himself to lessen the burden on her  Social History Preferred language: English Religion: Non-Denominational Cultural Background: NA Education: HS Health Literacy - How often do you need to have someone help you when you read instructions, pamphlets, or other written material from your doctor or pharmacy?: Never Writes: Yes Employment Status: Retired Marine scientist Issues: no issues Guardian/Conservator: None-according to MD pt is capable of making his own decisions while here   Abuse/Neglect Abuse/Neglect Assessment Can Be Completed: Yes Physical Abuse: Denies Verbal Abuse: Denies Sexual Abuse: Denies Exploitation of patient/patient's resources: Denies Self-Neglect: Denies  Patient response to: Social Isolation - How often do you feel lonely or isolated from those around you?: Never  Emotional Status Pt's affect, behavior and adjustment status: Pt is motivated to do well here, but is having BP and other issues today. He is not feeling well and feels it will be a wasted day due to can not push himself if BP is crashing when he tries to get up. He was mobile prior to admission and did need help with his ADL's prior to admission Recent Psychosocial Issues: other health issues Psychiatric History: History of anxiety takes medications for this but is having much more pain issues now. Have placed on the  neuro-psych list to be seen atleast by Monday Substance Abuse History: NA-remote smoking quit years ago  Patient / Family Perceptions, Expectations & Goals Pt/Family understanding of illness & functional limitations: Pt is able to explain his surgery and issues as a result. He does talk with the MDs involved and hopes they can work on managing his pain, so he is able to participate in therapies while here. MD is aware of this and working on this Premorbid pt/family roles/activities: husband, friend, neighbor, etc Anticipated changes in roles/activities/participation: resume Pt/family expectations/goals: Pt states:  I feel awful I know I can't do a lot today with my blood pressure issues.  Community [employer] Agencies: None Premorbid Home Care/DME Agencies: Other (Comment) (hurry cane, tub seat, cane, rw) Transportation available at discharge: self and wife Is the patient able to respond to transportation needs?: Yes In the past 12 months, has lack of transportation kept you from medical appointments or from getting medications?: No In the past 12 months, has lack of transportation kept you from meetings, work, or from getting things needed for daily living?: No Resource referrals recommended: Neuropsychology  Discharge Planning Living Arrangements: Spouse/significant other Support Systems: Spouse/significant other, Friends/neighbors Type of Residence: Private residence Insurance Resources: Media planner (specify) ([employer] Medicare) Surveyor, quantity Resources: Tree surgeon,  Family Support Financial Screen Referred: No Living Expenses: Own Money Management: Patient, Spouse Does the patient have any problems obtaining your medications?: No Home Management: wife Patient/Family Preliminary Plans: Return home with wife who has been assisting him prior to admission. He is hoping he will do well here and make good progress so he will not be a burden to her. She is in good health but he  worries about her and the amount of care he needs. Aware being evaluated today and goals being set if able due to BP issues. Care Coordinator Barriers to Discharge: Neurogenic Bowel & Bladder, Insurance for SNF coverage Care Coordinator Anticipated Follow Up Needs: HH/OP  Clinical Impression Pleasant gentleman who is motivated to do well when he feels better and his BP is better controlled. MD is working on pain management. Have placed on neuro-psych list to be seen. Wife is involved and supportive was assisting prior to admission.   Raymonde Asberry MATSU 09/16/2024, 10:52 AM

## 2024-09-16 NOTE — Evaluation (Signed)
 Occupational Therapy Assessment and Plan  Patient Details  Name: David King MRN: [id number] Date of Birth: 1956-06-03  OT Diagnosis: abnormal posture, disturbance of vision, and muscle weakness (generalized) Rehab Potential: Rehab Potential (ACUTE ONLY): Good ELOS: 10-14 days   Today's Date: 09/16/2024 OT Individual Time: 1100-1200 OT Individual Time Calculation (min): 60 min     Hospital Problem: Principal Problem:   Lumbar radiculopathy   Past Medical History:  Past Medical History:  Diagnosis Date   Anxiety    new dx   Arthritis    lumbar   Atherosclerotic vascular disease    Cataract    COPD (chronic obstructive pulmonary disease) (HCC)    Diverticulosis    Elevated PSA    Headache(784.0)    MIGRAINES   Hypertension 2021   Iritis    CHRONIC IN LEFT EYE - SOME VISIAL IMPAIRMENT IN LEFT EYE   Neuromuscular disorder (HCC)    left leg/foot,pinched siactic nerve   Pain    PAIN LEFT HIP AND DOWN LT LEG WITH NUMBNESS IN LEFT LEG--PT STATES SCIATIC NERVE IMPINGEMENT - PT PLANS BACK IN THE NEAR SURGERY.   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   Renal cysts, acquired, bilateral 03/19/2013   several simple , CT   Urinary frequency    AND NOCTURIA   Past Surgical History:  Past Surgical History:  Procedure Laterality Date   BACK SURGERY     CARDIAC CATHETERIZATION  03/10/2018   CATARACT EXTRACTION W/ INTRAOCULAR LENS IMPLANT Left    EYE SURGERY     RETINAL SURGERY LEFT EYE   HYDROCELE EXCISION Left 03/05/2013   Procedure: HYDROCELECTOMY ADULT;  Surgeon: Donnice Gwenyth Brooks, MD;  Location: [facility];  Service: Urology;  Laterality: Left;   INCISIONAL HERNIA REPAIR N/A 06/18/2022   Procedure: REPAIR OF INTERNAL HERNIA;  Surgeon: Rubin Calamity, MD;  Location: [facility] OR;  Service: General;  Laterality: N/A;   INGUINAL HERNIA REPAIR Bilateral AGE 55   INSERTION OF MESH N/A 06/11/2022   Procedure:  INSERTION OF MESH;  Surgeon: Rubin Calamity, MD;  Location: [facility] OR;  Service: General;  Laterality: N/A;   INSERTION OF MESH N/A 06/18/2022   Procedure: INSERTION OF MESH;  Surgeon: Rubin Calamity, MD;  Location: [facility] OR;  Service: General;  Laterality: N/A;   KYPHOPLASTY N/A 05/08/2023   Procedure: THORACIC SEVEN KYPHOPLASTY;  Surgeon: Beuford Anes, MD;  Location: [facility] OR;  Service: Orthopedics;  Laterality: N/A;   LAPAROSCOPY N/A 06/18/2022   Procedure: LAPAROSCOPY DIAGNOSTIC;  Surgeon: Rubin Calamity, MD;  Location: [facility] OR;  Service: General;  Laterality: N/A;   LYMPHADENECTOMY Bilateral 05/06/2013   Procedure: REDGIE;  Surgeon: Noretta Ferrara, MD;  Location: [facility];  Service: Urology;  Laterality: Bilateral;   PROSTATE BIOPSY N/A 03/05/2013   Procedure: PROSTATE BIOPSY and ultrasound;  Surgeon: Donnice Gwenyth Brooks, MD;  Location: [facility];  Service: Urology;  Laterality: N/A;   REMOVAL BURSA SAC, LEFT ELBOW  1996   ROBOT ASSISTED LAPAROSCOPIC RADICAL PROSTATECTOMY N/A 05/06/2013   Procedure: ROBOTIC ASSISTED LAPAROSCOPIC RADICAL PROSTATECTOMY LEVEL 3;  Surgeon: Noretta Ferrara, MD;  Location: [facility];  Service: Urology;  Laterality: N/A;   TONSILLECTOMY     as achild   TRANSFORAMINAL LUMBAR INTERBODY FUSION (TLIF) WITH PEDICLE SCREW FIXATION 1 LEVEL Left 09/08/2024   Procedure: LEFT-SIDED LUMBAR 4- LUMBAR 5 TRANSFORAMINAL LUMBAR INTERBODY FUSION AND DECOMPRESSION WITH INSTRUMENTATION AND ALLOGRAFT;  Surgeon: Beuford Anes, MD;  Location:  [facility] OR;  Service: Orthopedics;  Laterality: Left;  LEFT-SIDED LUMBAR 4- LUMBAR 5 TRANSFORAMINAL LUMBAR INTERBODY FUSION AND DECOMPRESSION WITH INSTRUMENTATION AND ALLOGRAFT   XI ROBOTIC ASSISTED VENTRAL HERNIA N/A 06/11/2022   Procedure: ROBOTIC INCISIONAL HERNIA REPAIR WITH MESH;  Surgeon: Rubin Calamity, MD;  Location: [facility] OR;  Service: General;  Laterality: N/A;    Assessment & Plan Clinical Impression: David King is a  68 year old right handed male with history significant for anxiety, migraine headaches, detached retina left eye, hypertension, prostate cancer status post radical prostatectomy 2014, COPD/quit smoking 7 years ago.  Per chart review patient lives with spouse.  1 level home 4 steps to entry.  Modified independent with cane prior to admission.  Presented 09/08/2024 with low back pain radiating to the left lower extremity.  X-rays and imaging revealed left-sided lumbar radiculopathy with large disc herniation/L4-5 spinal stenosis.  Underwent left-sided L4-5 transforaminal lumbar interbody fusion/posterior lateral fusion insertion of interbody device revision L4-5 decompression 09/08/2024 per Dr. Beuford.  Noted intraoperatively patient had developed hives swelling of tongue and lips along with hypotension critical care medicine consulted possible anaphylactic shock was started on epi drip and given Decadron .  He did require short-term intubation and transferred to the ICU.  He was extubated 9/11.  MRI T-spine with no acute findings.  MRI lumbar spine showed L4-5 PLIF moderate residual foraminal stenosis..  Since his surgery he had ongoing right leg weakness and numbness neurology was consulted for recommendations.  MRI of the brain showed no acute changes.  Neurology felt that exam findings consistent with either hypersensitivity to pain or mild component of nonorganic findings.  Recommendations were for outpatient EMG if right lower extremity numbness persists.  Therapy evaluations completed with brace when out of bed.  Therapy evaluations completed due to patient decreased functional mobility was admitted for a comprehensive rehab program. Patient is currently tearful about his current condition. Patient transferred to CIR on 09/15/2024 .    Patient currently requires mod with basic self-care skills and IADL secondary to muscle weakness, decreased coordination, and decreased sitting balance, decreased standing  balance, decreased postural control, and difficulty maintaining precautions.  Prior to hospitalization, patient could complete ADLs/IADLs with modified independent , pt reports use of single point cane and BSC in the home.  Patient will benefit from skilled intervention to increase independence with basic self-care skills prior to discharge home with care partner.  Anticipate patient will require intermittent supervision and follow up home health.  OT - End of Session Activity Tolerance: Tolerates 30+ min activity with multiple rests Endurance Deficit: Yes OT Assessment Rehab Potential (ACUTE ONLY): Good OT Barriers to Discharge: Neurogenic Bowel & Bladder OT Patient demonstrates impairments in the following area(s): Balance;Endurance;Motor;Pain OT Basic ADL's Functional Problem(s): Bathing;Dressing;Toileting OT Advanced ADL's Functional Problem(s): None OT Transfers Functional Problem(s): Toilet OT Additional Impairment(s): None OT Plan OT Intensity: Minimum of 1-2 x/day, 45 to 90 minutes OT Frequency: 5 out of 7 days OT Duration/Estimated Length of Stay: 10-14 days OT Treatment/Interventions: Balance/vestibular training;Discharge planning;Pain management;Functional electrical stimulation;Self Care/advanced ADL retraining;Therapeutic Activities;Skin care/wound managment;Patient/family education;Functional mobility training;Disease mangement/prevention;Therapeutic Exercise;DME/adaptive equipment instruction;UE/LE Strength taining/ROM;Wheelchair propulsion/positioning;UE/LE Coordination activities;Visual/perceptual remediation/compensation;Psychosocial support;Neuromuscular re-education;Community reintegration OT Self Feeding Anticipated Outcome(s): Mod-I OT Basic Self-Care Anticipated Outcome(s): Supervision OT Toileting Anticipated Outcome(s): Supervision OT Bathroom Transfers Anticipated Outcome(s): Supervision OT Recommendation Recommendations for Other Services: Neuropsych  consult;Therapeutic Recreation consult Therapeutic Recreation Interventions: Pet therapy;Stress management Patient destination: Home Follow Up Recommendations: Home health OT Equipment Recommended: 3 in 1 bedside comode  OT Evaluation Precautions/Restrictions  Precautions Precautions: None;Back Precaution Booklet Issued: Yes (comment) Recall of Precautions/Restrictions: Intact Precaution/Restrictions Comments: watch BP (orthostatic) Required Braces or Orthoses: Spinal Brace Spinal Brace: Thoracolumbosacral orthotic;Applied in sitting position Restrictions Weight Bearing Restrictions Per Provider Order: No Home Living/Prior Functioning Home Living Family/patient expects to be discharged to:: Private residence Living Arrangements: Spouse/significant other Available Help at Discharge: Family, Available 24 hours/day Type of Home: House Home Access: Stairs to enter Secretary/administrator of Steps: 4 Entrance Stairs-Rails: Can reach both Home Layout: Two level Alternate Level Stairs-Number of Steps: pt does no access second level of home Bathroom Shower/Tub: Health visitor: Handicapped height Bathroom Accessibility: Yes Additional Comments: bench in shower HH showerhead, grab bars in shower  Lives With: Spouse IADL History Homemaking Responsibilities: No Current License: Yes Mode of Transportation: Merchant navy officer Occupation: Retired Prior Function Level of Independence: Independent with basic ADLs, Independent with transfers, Independent with homemaking with ambulation  Able to Take Stairs?: Yes Driving: Yes Vocation: Retired Administrator, sports Baseline Vision/History: 1 Wears glasses Ability to See in Adequate Light: 0 Adequate Vision Assessment?: Vision impaired- to be further tested in functional context Additional Comments: L eye detached retina, blurry vision, R eye normal vision Perception  Perception: Within Functional Limits Praxis Praxis:  WFL Cognition Cognition Overall Cognitive Status: Within Functional Limits for tasks assessed Arousal/Alertness: Awake/alert Orientation Level: Person;Place Memory: Appears intact Awareness: Appears intact Problem Solving: Appears intact Safety/Judgment: Appears intact Brief Interview for Mental Status (BIMS) Repetition of Three Words (First Attempt): 3 Temporal Orientation: Year: Correct Temporal Orientation: Month: Accurate within 5 days Temporal Orientation: Day: Correct Recall: Sock: Yes, no cue required Recall: Blue: Yes, no cue required Recall: Bed: Yes, no cue required BIMS Summary Score: 15 Sensation Sensation Light Touch: Impaired by gross assessment Hot/Cold: Appears Intact Proprioception: Impaired by gross assessment Stereognosis: Not tested Additional Comments: right drop foot Coordination Gross Motor Movements are Fluid and Coordinated: No Fine Motor Movements are Fluid and Coordinated: Yes Finger Nose Finger Test: 10/10 Motor  Motor Motor: Abnormal tone Motor - Skilled Clinical Observations: unable to DF right foot  Trunk/Postural Assessment  Cervical Assessment Cervical Assessment: Exceptions to [facility] (right deviation) Thoracic Assessment Thoracic Assessment: Within Functional Limits Lumbar Assessment Lumbar Assessment: Exceptions to WFL (decreased lordosis) Postural Control Postural Control: Deficits on evaluation  Balance Balance Balance Assessed: Yes Static Sitting Balance Static Sitting - Balance Support: Bilateral upper extremity supported;Feet supported Static Sitting - Level of Assistance: 5: Stand by assistance Static Sitting - Comment/# of Minutes: R Lateral lean Dynamic Sitting Balance Dynamic Sitting - Balance Support: Feet supported;Bilateral upper extremity supported Dynamic Sitting - Level of Assistance: 5: Stand by assistance Dynamic Sitting - Balance Activities: Lateral lean/weight shifting Sitting balance - Comments: EOB with  UE support to prevent posterior leaning Static Standing Balance Static Standing - Balance Support: Bilateral upper extremity supported Static Standing - Level of Assistance: 4: Min assist Static Standing - Comment/# of Minutes: 1 minute with LOB noted Extremity/Trunk Assessment RUE Assessment RUE Assessment: Within Functional Limits General Strength Comments: 5/5 LUE Assessment LUE Assessment: Within Functional Limits General Strength Comments: 5/5  Care Tool Care Tool Self Care Eating   Eating Assist Level: Set up assist    Oral Care    Oral Care Assist Level: Set up assist    Bathing   Body parts bathed by patient: Right arm;Left arm;Chest;Abdomen;Front perineal area;Right upper leg;Left upper leg;Face Body parts bathed by helper: Buttocks;Left lower leg;Right lower leg   Assist Level: Minimal Assistance -  Patient > 75%    Upper Body Dressing(including orthotics)       Assist Level: Set up assist    Lower Body Dressing (excluding footwear)     Assist for lower body dressing: Moderate Assistance - Patient 50 - 74%    Putting on/Taking off footwear     Assist for footwear: Maximal Assistance - Patient 25 - 49%       Care Tool Toileting Toileting activity   Assist for toileting: Moderate Assistance - Patient 50 - 74%     Care Tool Bed Mobility Roll left and right activity   Roll left and right assist level: Minimal Assistance - Patient > 75%    Sit to lying activity   Sit to lying assist level: Minimal Assistance - Patient > 75%    Lying to sitting on side of bed activity   Lying to sitting on side of bed assist level: the ability to move from lying on the back to sitting on the side of the bed with no back support.: Moderate Assistance - Patient 50 - 74%     Care Tool Transfers Sit to stand transfer   Sit to stand assist level: Moderate Assistance - Patient 50 - 74%    Chair/bed transfer   Chair/bed transfer assist level: Minimal Assistance - Patient >  75%     Toilet transfer   Assist Level: Minimal Assistance - Patient > 75%     Care Tool Cognition  Expression of Ideas and Wants Expression of Ideas and Wants: 4. Without difficulty (complex and basic) - expresses complex messages without difficulty and with speech that is clear and easy to understand  Understanding Verbal and Non-Verbal Content Understanding Verbal and Non-Verbal Content: 4. Understands (complex and basic) - clear comprehension without cues or repetitions   Memory/Recall Ability Memory/Recall Ability : Current season;Location of own room;Staff names and faces;That he or she is in a hospital/hospital unit   Refer to Care Plan for Long Term Goals  SHORT TERM GOAL WEEK 1 OT Short Term Goal 1 (Week 1): Pt will complete LB dressing with Min A OT Short Term Goal 2 (Week 1): Pt will complete toileting with Min A OT Short Term Goal 3 (Week 1): Pt will complete functional transfers with Min A OT Short Term Goal 4 (Week 1): Pt will don/doff TLSO brace with Mod A  Recommendations for other services: Neuropsych and Therapeutic Recreation  Pet therapy and Stress management   Skilled Therapeutic Intervention ADL ADL Eating: Set up Where Assessed-Eating: Bed level Where Assessed-Grooming: Sitting at sink Upper Body Bathing: Minimal assistance Where Assessed-Upper Body Bathing: Edge of bed Lower Body Bathing: Moderate assistance Where Assessed-Lower Body Bathing: Edge of bed Upper Body Dressing: Setup Where Assessed-Upper Body Dressing: Edge of bed Lower Body Dressing: Moderate assistance Where Assessed-Lower Body Dressing: Edge of bed Toileting: Moderate assistance Where Assessed-Toileting: Bedside Commode Toilet Transfer: Minimal assistance Toilet Transfer Method: Sit pivot Toilet Transfer Equipment: Bedside commode Mobility  Bed Mobility Bed Mobility: Rolling Right;Rolling Left;Supine to Sit Rolling Right: Moderate Assistance - Patient 50-74% Rolling Left: Moderate  Assistance - Patient 50-74% Supine to Sit: Moderate Assistance - Patient 50-74% Transfers Sit to Stand: Moderate Assistance - Patient 50-74% Stand to Sit: Minimal Assistance - Patient > 75%  1:1 evaluation and treatment session initiated this date. OT roles, goals and purpose discussed with pt as well as therapy schedule. ADL completed this date with levels of assist listed above. Pt requires TLSO brace out of bed, Max  A to don. OT demo'd reacher for LB dressing, Pt completed with Mod A. Will benefit from continued AE training for LB dressings/footwear. With position changes pt reports dizziness, BP monitored throughout session. Per PT, pt experiencing incontinence when standing of bladder. BSC moved to EOB to encourage releasing bladder before activity. Wife present and reports able to assist with care in the home. Pt able to recall back precautions with independence, supervision for adhering to precautions during activity. Pt would benefit from skilled OT in IPR setting in order to maximize independence with ADLs upon D/C.    Discharge Criteria: Patient will be discharged from OT if patient refuses treatment 3 consecutive times without medical reason, if treatment goals not met, if there is a change in medical status, if patient makes no progress towards goals or if patient is discharged from hospital.  The above assessment, treatment plan, treatment alternatives and goals were discussed and mutually agreed upon: by patient and by family  Yeshaya Vath Woods-Chance, MS, OTR/L 09/16/2024, 3:14 PM

## 2024-09-16 NOTE — Progress Notes (Signed)
 Physical Therapy Assessment and Plan  Patient Details  Name: David King MRN: [id number] Date of Birth: 02/08/1956  PT Diagnosis: Abnormal posture, Abnormality of gait, Difficulty walking, Hypotonia, Impaired sensation, Muscle spasms, Muscle weakness, Pain in right LE, and Paralysis Rehab Potential: Good ELOS: 10-14 days   Today's Date: 09/16/2024 PT Individual Time: 830-930 PT Individual Time Calculation (min): 60 min    PT Individual Time: 1300-1353 PT Individual Time Calculation (min): 53 min    Hospital Problem: Principal Problem:   Lumbar radiculopathy   Past Medical History:  Past Medical History:  Diagnosis Date   Anxiety    new dx   Arthritis    lumbar   Atherosclerotic vascular disease    Cataract    COPD (chronic obstructive pulmonary disease) (HCC)    Diverticulosis    Elevated PSA    Headache(784.0)    MIGRAINES   Hypertension 2021   Iritis    CHRONIC IN LEFT EYE - SOME VISIAL IMPAIRMENT IN LEFT EYE   Neuromuscular disorder (HCC)    left leg/foot,pinched siactic nerve   Pain    PAIN LEFT HIP AND DOWN LT LEG WITH NUMBNESS IN LEFT LEG--PT STATES SCIATIC NERVE IMPINGEMENT - PT PLANS BACK IN THE NEAR SURGERY.   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   Renal cysts, acquired, bilateral 03/19/2013   several simple , CT   Urinary frequency    AND NOCTURIA   Past Surgical History:  Past Surgical History:  Procedure Laterality Date   BACK SURGERY     CARDIAC CATHETERIZATION  03/10/2018   CATARACT EXTRACTION W/ INTRAOCULAR LENS IMPLANT Left    EYE SURGERY     RETINAL SURGERY LEFT EYE   HYDROCELE EXCISION Left 03/05/2013   Procedure: HYDROCELECTOMY ADULT;  Surgeon: Donnice Gwenyth Brooks, MD;  Location: [facility];  Service: Urology;  Laterality: Left;   INCISIONAL HERNIA REPAIR N/A 06/18/2022   Procedure: REPAIR OF INTERNAL HERNIA;  Surgeon: Rubin Calamity, MD;  Location: [facility] OR;  Service:  General;  Laterality: N/A;   INGUINAL HERNIA REPAIR Bilateral AGE 29   INSERTION OF MESH N/A 06/11/2022   Procedure: INSERTION OF MESH;  Surgeon: Rubin Calamity, MD;  Location: [facility] OR;  Service: General;  Laterality: N/A;   INSERTION OF MESH N/A 06/18/2022   Procedure: INSERTION OF MESH;  Surgeon: Rubin Calamity, MD;  Location: [facility] OR;  Service: General;  Laterality: N/A;   KYPHOPLASTY N/A 05/08/2023   Procedure: THORACIC SEVEN KYPHOPLASTY;  Surgeon: Beuford Anes, MD;  Location: [facility] OR;  Service: Orthopedics;  Laterality: N/A;   LAPAROSCOPY N/A 06/18/2022   Procedure: LAPAROSCOPY DIAGNOSTIC;  Surgeon: Rubin Calamity, MD;  Location: [facility] OR;  Service: General;  Laterality: N/A;   LYMPHADENECTOMY Bilateral 05/06/2013   Procedure: REDGIE;  Surgeon: Noretta Ferrara, MD;  Location: [facility] ORS;  Service: Urology;  Laterality: Bilateral;   PROSTATE BIOPSY N/A 03/05/2013   Procedure: PROSTATE BIOPSY and ultrasound;  Surgeon: Donnice Gwenyth Brooks, MD;  Location: [facility];  Service: Urology;  Laterality: N/A;   REMOVAL BURSA SAC, LEFT ELBOW  1996   ROBOT ASSISTED LAPAROSCOPIC RADICAL PROSTATECTOMY N/A 05/06/2013   Procedure: ROBOTIC ASSISTED LAPAROSCOPIC RADICAL PROSTATECTOMY LEVEL 3;  Surgeon: Noretta Ferrara, MD;  Location: [facility];  Service: Urology;  Laterality: N/A;   TONSILLECTOMY     as achild   TRANSFORAMINAL LUMBAR INTERBODY FUSION (TLIF) WITH PEDICLE SCREW FIXATION 1 LEVEL Left 09/08/2024   Procedure: LEFT-SIDED LUMBAR  4- LUMBAR 5 TRANSFORAMINAL LUMBAR INTERBODY FUSION AND DECOMPRESSION WITH INSTRUMENTATION AND ALLOGRAFT;  Surgeon: Beuford Anes, MD;  Location: [facility] OR;  Service: Orthopedics;  Laterality: Left;  LEFT-SIDED LUMBAR 4- LUMBAR 5 TRANSFORAMINAL LUMBAR INTERBODY FUSION AND DECOMPRESSION WITH INSTRUMENTATION AND ALLOGRAFT   XI ROBOTIC ASSISTED VENTRAL HERNIA N/A 06/11/2022   Procedure: ROBOTIC INCISIONAL HERNIA REPAIR WITH MESH;  Surgeon: Rubin Calamity, MD;   Location: [facility] OR;  Service: General;  Laterality: N/A;    Assessment & Plan Clinical Impression: Patient is a 68 year old right handed male with history significant for anxiety, migraine headaches, detached retina left eye, hypertension, prostate cancer status post radical prostatectomy 2014, COPD/quit smoking 7 years ago. Per chart review patient lives with spouse. 1 level home 4 steps to entry. Modified independent with cane prior to admission. Presented 09/08/2024 with low back pain radiating to the left lower extremity. X-rays and imaging revealed left-sided lumbar radiculopathy with large disc herniation/L4-5 spinal stenosis. Underwent left-sided L4-5 transforaminal lumbar interbody fusion/posterior lateral fusion insertion of interbody device revision L4-5 decompression 09/08/2024 per Dr. Beuford. Noted intraoperatively patient had developed hives swelling of tongue and lips along with hypotension critical care medicine consulted possible anaphylactic shock was started on epi drip and given Decadron . He did require short-term intubation and transferred to the ICU. He was extubated 9/11. MRI T-spine with no acute findings. MRI lumbar spine showed L4-5 PLIF moderate residual foraminal stenosis.. Since his surgery he had ongoing right leg weakness and numbness neurology was consulted for recommendations. MRI of the brain showed no acute changes. Neurology felt that exam findings consistent with either hypersensitivity to pain or mild component of nonorganic findings. Recommendations were for outpatient EMG if right lower extremity numbness persists. Therapy evaluations completed with brace when out of bed. Therapy evaluations completed due to patient decreased functional mobility was admitted for a comprehensive rehab program. Patient is currently tearful about his current condition. Patient transferred to CIR on 09/15/2024 .   Patient currently requires mod with mobility secondary to muscle weakness and  muscle paralysis, decreased cardiorespiratoy endurance, impaired timing and sequencing, abnormal tone, unbalanced muscle activation, and decreased coordination, and decreased standing balance, decreased postural control, and decreased balance strategies.  Prior to hospitalization, patient was independent  with mobility and lived with Spouse in a House home.  Home access is 4Stairs to enter.  Patient will benefit from skilled PT intervention to maximize safe functional mobility, minimize fall risk, and decrease caregiver burden for planned discharge home with 24 hour supervision.  Anticipate patient will benefit from follow up Outpatient PT at discharge.  PT - End of Session Activity Tolerance: Decreased this session;Tolerates < 10 min activity, no significant change in vital signs Endurance Deficit: Yes PT Assessment Rehab Potential (ACUTE/IP ONLY): Good PT Barriers to Discharge: None PT Patient demonstrates impairments in the following area(s): Balance;Edema;Endurance;Motor;Pain;Safety;Sensory;Skin Integrity PT Transfers Functional Problem(s): Bed Mobility;Bed to Chair;Car PT Locomotion Functional Problem(s): Ambulation;Wheelchair Mobility PT Plan PT Intensity: Minimum of 1-2 x/day ,45 to 90 minutes PT Frequency: 5 out of 7 days PT Duration Estimated Length of Stay: 10-14 days PT Treatment/Interventions: Ambulation/gait training;Discharge planning;Functional mobility training;Psychosocial support;Therapeutic Activities;Wheelchair propulsion/positioning;Therapeutic Exercise;Skin care/wound management;Neuromuscular re-education;Disease management/prevention;Balance/vestibular training;DME/adaptive equipment instruction;Pain management;Splinting/orthotics;Community reintegration;Functional electrical stimulation;UE/LE Strength taining/ROM;Patient/family education;Stair training;UE/LE Coordination activities PT Transfers Anticipated Outcome(s): mod i PT Locomotion Anticipated Outcome(s): mod i PT  Recommendation Recommendations for Other Services: Neuropsych consult;Therapeutic Recreation consult Therapeutic Recreation Interventions: Stress management;Outing/community reintergration Follow Up Recommendations: Outpatient PT Patient destination: Home Equipment Recommended: To be determined  PT Evaluation Precautions/Restrictions Precautions Precautions: None;Back Precaution Booklet Issued: Yes (comment) Recall of Precautions/Restrictions: Intact Precaution/Restrictions Comments: watch BP (orthostatic) Required Braces or Orthoses: Spinal Brace Spinal Brace: Thoracolumbosacral orthotic;Applied in sitting position Restrictions Weight Bearing Restrictions Per Provider Order: No118/75; [birth year] EOB [birth year] 113 General PT Amount of Missed Time (min): 22 Minutes PT Missed Treatment Reason: Patient fatigue Vital Signs  Pain Pain Assessment Pain Scale: 0-10 Pain Score: 8 Pain Type: Acute pain Pain Location: right shin/Leg Pain Orientation: Right;Anterior Pain Descriptors / Indicators: Burning Pain Onset: Gradual Pain Interference Pain Interference Pain Effect on Sleep: 3. Frequently Pain Interference with Therapy Activities: 1. Rarely or not at all Pain Interference with Day-to-Day Activities: 4. Almost constantly Home Living/Prior Functioning Home Living Living Arrangements: Spouse/significant other Available Help at Discharge: Family;Available 24 hours/day Type of Home: House Home Access: Stairs to enter [employer] of Steps: 4 Entrance Stairs-Rails: Can reach both Home Layout: Two level Alternate Level Stairs-Number of Steps: pt does no access second level of home Bathroom Shower/Tub: Health visitor: Handicapped height Bathroom Accessibility: Yes Additional Comments: bench in shower HH showerhead, grab bars in shower  Lives With: Spouse Prior Function Level of Independence: Independent with basic ADLs;Independent with transfers;Independent with  homemaking with ambulation  Able to Take Stairs?: Yes Driving: Yes Vocation: Retired Vision/Perception  Vision - History Ability to See in Adequate Light: 0 Adequate Vision - Assessment Additional Comments: L eye detached retina, blurry vision, R eye normal vision Perception Perception: Within Functional Limits Praxis Praxis: WFL  Cognition Overall Cognitive Status: Within Functional Limits for tasks assessed Arousal/Alertness: Awake/alert Orientation Level: Oriented X4 Memory: Appears intact Awareness: Appears intact Problem Solving: Appears intact Safety/Judgment: Appears intact Sensation Sensation Light Touch: Impaired by gross assessment Hot/Cold: Appears Intact Proprioception: Impaired by gross assessment Stereognosis: Not tested Additional Comments: right drop foot Coordination Gross Motor Movements are Fluid and Coordinated: No Fine Motor Movements are Fluid and Coordinated: Yes Finger Nose Finger Test: 10/10 Motor  Motor Motor: Abnormal tone Motor - Skilled Clinical Observations: unable to DF right foot   Trunk/Postural Assessment  Cervical Assessment Cervical Assessment: Exceptions to [facility] (right deviation) Thoracic Assessment Thoracic Assessment: Within Functional Limits Lumbar Assessment Lumbar Assessment: Exceptions to WFL (decreased lordosis) Postural Control Postural Control: Deficits on evaluation  Balance Balance Balance Assessed: Yes Static Sitting Balance Static Sitting - Balance Support: Bilateral upper extremity supported;Feet supported Static Sitting - Level of Assistance: 5: Stand by assistance Static Sitting - Comment/# of Minutes: R Lateral lean Dynamic Sitting Balance Dynamic Sitting - Balance Support: Feet supported;Bilateral upper extremity supported Dynamic Sitting - Level of Assistance: 5: Stand by assistance Dynamic Sitting - Balance Activities: Lateral lean/weight shifting Sitting balance - Comments: EOB with UE support to prevent  posterior leaning Static Standing Balance Static Standing - Balance Support: Bilateral upper extremity supported Static Standing - Level of Assistance: 4: Min assist Static Standing - Comment/# of Minutes: 1 minute with LOB noted Extremity Assessment  RUE Assessment RUE Assessment: Within Functional Limits General Strength Comments: 5/5 LUE Assessment LUE Assessment: Within Functional Limits General Strength Comments: 5/5 RLE Assessment RLE Assessment: Exceptions to [facility] RLE Strength Right Hip Flexion: 4-/5 Right Hip Extension: 4-/5 Right Hip ABduction: 4/5 Right Hip ADduction: 4/5 Right Knee Flexion: 3/5 Right Knee Extension: 3-/5 Right Ankle Dorsiflexion: 0/5 Right Ankle Plantar Flexion: 2-/5 LLE Assessment LLE Assessment: Within Functional Limits  Care Tool Care Tool Bed Mobility Roll left and right activity   Roll left and right assist level: Minimal Assistance -  Patient > 75%    Sit to lying activity   Sit to lying assist level: Minimal Assistance - Patient > 75%    Lying to sitting on side of bed activity   Lying to sitting on side of bed assist level: the ability to move from lying on the back to sitting on the side of the bed with no back support.: Moderate Assistance - Patient 50 - 74%     Care Tool Transfers Sit to stand transfer   Sit to stand assist level: Moderate Assistance - Patient 50 - 74%    Chair/bed transfer   Chair/bed transfer assist level: Minimal Assistance - Patient > 75%    Car transfer Car transfer activity did not occur: Safety/medical concerns (fatigue, unsafe)        Care Tool Locomotion Ambulation   Assist level: Contact Guard/Touching assist Assistive device: Walker-rolling Max distance: 20'  Walk 10 feet activity   Assist level: Contact Guard/Touching assist Assistive device: Walker-rolling   Walk 50 feet with 2 turns activity Walk 50 feet with 2 turns activity did not occur: Safety/medical concerns (fatigue, unsafe)       Walk 150 feet activity Walk 150 feet activity did not occur: Safety/medical concerns (fatigue, unsafe)      Walk 10 feet on uneven surfaces activity Walk 10 feet on uneven surfaces activity did not occur: Safety/medical concerns (fatigue, unsafe)      Stairs Stair activity did not occur: Safety/medical concerns (fatigue, unsafe)        Walk up/down 1 step activity Walk up/down 1 step or curb (drop down) activity did not occur: Safety/medical concerns (fatigue, unsafe)      Walk up/down 4 steps activity Walk up/down 4 steps activity did not occur: Safety/medical concerns (fatigue, unsafe)      Walk up/down 12 steps activity Walk up/down 12 steps activity did not occur: Safety/medical concerns (fatigue, unsafe)      Pick up small objects from floor Pick up small object from the floor (from standing position) activity did not occur: Safety/medical concerns (fatigue, unsafe)      Wheelchair Is the patient using a wheelchair?: Yes Type of Wheelchair: Manual   Wheelchair assist level: Total Assistance - Patient < 25% Max wheelchair distance: 200'  Wheel 50 feet with 2 turns activity   Assist Level: Total Assistance - Patient < 25%  Wheel 150 feet activity   Assist Level: Total Assistance - Patient < 25%    Refer to Care Plan for Long Term Goals  SHORT TERM GOAL WEEK 1 PT Short Term Goal 1 (Week 1): Pt will perform w/c transfers with CGA PT Short Term Goal 2 (Week 1): Pt will amb x25' with RW CGA without rest PT Short Term Goal 3 (Week 1): Pt will perform supine<->sit transfers with CGA  Recommendations for other services: Neuropsych and Therapeutic Recreation  Stress management and Outing/community reintegration  Skilled Therapeutic Intervention Mobility Bed Mobility Bed Mobility: Rolling Right;Rolling Left;Supine to Sit Rolling Right: Moderate Assistance - Patient 50-74% Rolling Left: Moderate Assistance - Patient 50-74% Supine to Sit: Moderate Assistance - Patient  50-74% Transfers Transfers: Sit to Stand;Stand to Sit;Stand Pivot Transfers Sit to Stand: Moderate Assistance - Patient 50-74% Stand to Sit: Minimal Assistance - Patient > 75% Stand Pivot Transfers: Contact Guard/Touching assist Squat Pivot Transfers: Minimal Assistance - Patient > 75% Transfer (Assistive device): Rolling walker Locomotion  Gait Ambulation: Yes Gait Assistance: Minimal Assistance - Patient > 75% Gait Distance (Feet): 20 Feet Assistive device: Rolling  walker Gait Assistance Details: Tactile cues for posture;Verbal cues for sequencing;Verbal cues for gait pattern;Verbal cues for safe use of DME/AE;Visual cues/gestures for precautions/safety;Visual cues/gestures for sequencing;Verbal cues for precautions/safety;Verbal cues for technique Gait Assistance Details: lack DF Gait Gait: Yes Gait Pattern: Impaired Gait Pattern: Step-to pattern;Decreased stride length;Decreased dorsiflexion - right;Right hip hike Gait velocity: decreased Stairs / Additional Locomotion Stairs: No (fatigue, unsafe) Naval architect Mobility: Yes Wheelchair Assistance: Total Assistance - Patient <25%  Evaluation completed (see details above and below) with education on PT POC and goals and individual treatment initiated with focus on bed mobility, balance, transfers, car transfers, WC mobility, and ambulation. Pt positioned in bed, left side lying screaming/crying in pain 9/10. Pt unable to get comfortable.  Pt completes supine to sit @ EOB with bed features and cues for positioning with mod assist.  Pt reports pain 4/10 in sitting and is able to relax and converse with PT.  Sit to stand from bed to RW with mod assist, amb to w/c ~x15'.  Gait is antalgic, short stride, steppage RLE.  Pt started to dribble urine as he started to walk, when given urinal, pt was unable to void. Pt requesting to return back to bed, unable to perform ramp and stair negotiation, car transfers.   Mod assist  sit->supine, with all needs within reach.   2nd Session: Pt received in w/c, with pain 1/10, pt agrees to therapy. Pt lethargic, difficulty keeping eyes open initially. Pt wheeled to main gym for // bars work.  Intermittent rest breaks provided. Pt performs standing RLE hip flexion 2x10, amb to end of // bars, side stepping back to w/c with LLE leading. Pt states he is too fatigued to continue and requesting to return to room.  Pt apologetic. PT dicussed energy conservation, healing process, goals with pt. Pt transferred in bed, Nsg T assist to turn on air mattress, with all needs within reach.    Discharge Criteria: Patient will be discharged from PT if patient refuses treatment 3 consecutive times without medical reason, if treatment goals not met, if there is a change in medical status, if patient makes no progress towards goals or if patient is discharged from hospital.  The above assessment, treatment plan, treatment alternatives and goals were discussed and mutually agreed upon: by patient  [name] Fast 09/16/2024, 2:14 PM

## 2024-09-16 NOTE — Plan of Care (Signed)
  Problem: RH Eating Goal: LTG Patient will perform eating w/assist, cues/equip (OT) Description: LTG: Patient will perform eating with assist, with/without cues using equipment (OT) Flowsheets (Taken 09/16/2024 1534) LTG: Pt will perform eating with assistance level of: Independent with assistive device    Problem: RH Grooming Goal: LTG Patient will perform grooming w/assist,cues/equip (OT) Description: LTG: Patient will perform grooming with assist, with/without cues using equipment (OT) Flowsheets (Taken 09/16/2024 1534) LTG: Pt will perform grooming with assistance level of: Independent with assistive device    Problem: RH Bathing Goal: LTG Patient will bathe all body parts with assist levels (OT) Description: LTG: Patient will bathe all body parts with assist levels (OT) Flowsheets (Taken 09/16/2024 1534) LTG: Pt will perform bathing with assistance level/cueing: Set up assist    Problem: RH Dressing Goal: LTG Patient will perform upper body dressing (OT) Description: LTG Patient will perform upper body dressing with assist, with/without cues (OT). Flowsheets (Taken 09/16/2024 1534) LTG: Pt will perform upper body dressing with assistance level of: Independent with assistive device Goal: LTG Patient will perform lower body dressing w/assist (OT) Description: LTG: Patient will perform lower body dressing with assist, with/without cues in positioning using equipment (OT) Flowsheets (Taken 09/16/2024 1534) LTG: Pt will perform lower body dressing with assistance level of: Independent with assistive device   Problem: RH Toileting Goal: LTG Patient will perform toileting task (3/3 steps) with assistance level (OT) Description: LTG: Patient will perform toileting task (3/3 steps) with assistance level (OT)  Flowsheets (Taken 09/16/2024 1534) LTG: Pt will perform toileting task (3/3 steps) with assistance level: Independent with assistive device   Problem: RH Toilet Transfers Goal: LTG  Patient will perform toilet transfers w/assist (OT) Description: LTG: Patient will perform toilet transfers with assist, with/without cues using equipment (OT) Flowsheets (Taken 09/16/2024 1534) LTG: Pt will perform toilet transfers with assistance level of: Supervision/Verbal cueing   Problem: RH Tub/Shower Transfers Goal: LTG Patient will perform tub/shower transfers w/assist (OT) Description: LTG: Patient will perform tub/shower transfers with assist, with/without cues using equipment (OT) Flowsheets (Taken 09/16/2024 1534) LTG: Pt will perform tub/shower stall transfers with assistance level of: Supervision/Verbal cueing   Problem: RH Memory Goal: LTG Patient will demonstrate ability for day to day recall/carry over during activities of daily living with assistance level (OT) Description: LTG:  Patient will demonstrate ability for day to day recall/carry over during activities of daily living with assistance level (OT). Flowsheets (Taken 09/16/2024 1534) LTG:  Patient will demonstrate ability for day to day recall/carry over during activities of daily living with assistance level (OT): Independent

## 2024-09-16 NOTE — Progress Notes (Signed)
    Patient is PO day 8 and has been discharged to CIR and is happy to be in rehab. He states it has been a very busy day for him, however, he is feeling better now than he did this morning. It appears they have added cymbalta  and increased his neurontin , robaxin , and percocet. He states he has been on neurontin  for many years but had been tapering off. His pre-op left left pain continues to be resolved and his low back pain continues to be minimal/well controlled. His primary pain source continues to be his RLE/foot which is new since surgery and felt to be secondary to his anaphylactic reaction with hypotension intra-operatively. He is hopeful this will improve. He states since discontinuing the decadron  he does not feel as well but feels overall OK. He continues to urinate without difficulty now and has had return of his appetite and BM's. He had venous doppler today of LE's that was negative and was evaluated for some DTPI's on his sacrum and right heel. He states these started to bother him 2 days ago. WOC has been consulted and recs applied. He is overall feeling more optimistic now but still struggling with RLL/foot pain/hypersensitivity  BP 130/81 (BP Location: Left Arm)   Pulse 73   Temp 97.9 F (36.6 C)   Resp 18   Ht 5' 9 (1.753 m)   Wt 83.2 kg   SpO2 92%   BMI 27.09 kg/m    Laying in bed with sacral pad and heel pads, I added a pillow under his calves to elevate heels off of bed. He is comfortable and eating/drinking throughout visit  POD #8 s/p left lumbar decompression and fusion complicated by anaphylactic reaction with hypotension. Resolved pre-op Left leg pain. New RLL/foor pain PO felt to be secondary to anaphylaxis and ischemia. Pt transferred to CIR 09/15/24  - From the standpoint of his lumbar fusion he is doing excellent with improved LLE symptoms and minimal well controlled PO LBP - R foot and calf pain and weakness, unknown etiology at this venture with negative neurology  workup, unexplained from surgical procedure and benign PO MRI imaging thoracic/lumbar/brain. Neurology with concerns for hypersensitivity vs non-organic origins   -BIL LE dopplers negative - Bladder function has returned and pt urinating with no difficulty - Appetite has returned with normal BM's - DTPI's sacrum and R>L heel developing over past 3 days, now followed by WOC nurses  - PT/OT, encourage ambulation             -TLSO brace at all time when OOB/upright with straps UNDER arms  - f/u in office 2 weeks PO  We appreciate CIR's excellent care and guidance   Ileana Clara, PA-C Orthopaedic Spine Surgery

## 2024-09-16 NOTE — Consult Note (Signed)
 WOC Nurse Consult Note: Reason for Consult: Requested to assess a DTPI on Sacrum and R heel. Pics in chart. Performed remotely evaluating photos and notes. Wound type: Deep pressure injury Pressure Injury POA: Yes  Gluteal cleft, both sides, shear associated. Measurement: 7 cm x 5 cm (see flowsheet). Purple/red skin. Wound bed: no skin breakdown. Drainage (amount, consistency, odor) NONE Periwound: intact. Dressing procedure/placement/frequency: Cleanse with warm water , pat dry, apply foam dressing, change every 3 days or PRN.  Right heel Measurement: 5.6 cm x 4.3 cm (see flowsheet). Purple skin. Wound bed: no skin breakdown. Drainage (amount, consistency, odor) NONE Periwound: intact. Dressing procedure/placement/frequency: Cleanse with warm water , pat dry, apply foam dressing, change every 3 days or PRN.  Bed nurse recommendations: - Turn and repositioning per hospital policy - If the pt is able to go in the chair, add a chair pressure retribution pad.  - Prevalon boot bilateral heels.  WOC team will not plan to follow further. Please reconsult if further assistance is needed. Thank-you,  Lela Holm RN, CNS, [employer], MSN.  (Phone [phone])

## 2024-09-16 NOTE — Progress Notes (Signed)
 Bilateral lower extremity venous duplex has been completed.  Results can be found in chart review under CV Proc.  09/16/2024 11:00 AM  Ojani Berenson Elden Appl, RVT.

## 2024-09-17 DIAGNOSIS — M25579 Pain in unspecified ankle and joints of unspecified foot: Secondary | ICD-10-CM

## 2024-09-17 DIAGNOSIS — D72829 Elevated white blood cell count, unspecified: Secondary | ICD-10-CM | POA: Diagnosis not present

## 2024-09-17 DIAGNOSIS — R7989 Other specified abnormal findings of blood chemistry: Secondary | ICD-10-CM | POA: Diagnosis not present

## 2024-09-17 DIAGNOSIS — L89616 Pressure-induced deep tissue damage of right heel: Secondary | ICD-10-CM | POA: Diagnosis not present

## 2024-09-17 DIAGNOSIS — M5416 Radiculopathy, lumbar region: Secondary | ICD-10-CM | POA: Diagnosis not present

## 2024-09-17 LAB — CBC WITH DIFFERENTIAL/PLATELET
Abs Immature Granulocytes: 0.72 K/uL — ABNORMAL HIGH (ref 0.00–0.07)
Basophils Absolute: 0.1 K/uL (ref 0.0–0.1)
Basophils Relative: 1 %
Eosinophils Absolute: 0.1 K/uL (ref 0.0–0.5)
Eosinophils Relative: 1 %
HCT: 40.5 % (ref 39.0–52.0)
Hemoglobin: 13.7 g/dL (ref 13.0–17.0)
Immature Granulocytes: 6 %
Lymphocytes Relative: 16 %
Lymphs Abs: 1.9 K/uL (ref 0.7–4.0)
MCH: 32.9 pg (ref 26.0–34.0)
MCHC: 33.8 g/dL (ref 30.0–36.0)
MCV: 97.1 fL (ref 80.0–100.0)
Monocytes Absolute: 0.9 K/uL (ref 0.1–1.0)
Monocytes Relative: 8 %
Neutro Abs: 8.5 K/uL — ABNORMAL HIGH (ref 1.7–7.7)
Neutrophils Relative %: 68 %
Platelets: 310 K/uL (ref 150–400)
RBC: 4.17 MIL/uL — ABNORMAL LOW (ref 4.22–5.81)
RDW: 13.2 % (ref 11.5–15.5)
Smear Review: NORMAL
WBC: 12.3 K/uL — ABNORMAL HIGH (ref 4.0–10.5)
nRBC: 0 % (ref 0.0–0.2)

## 2024-09-17 LAB — COMPREHENSIVE METABOLIC PANEL WITH GFR
ALT: 160 U/L — ABNORMAL HIGH (ref 0–44)
AST: 123 U/L — ABNORMAL HIGH (ref 15–41)
Albumin: 3.3 g/dL — ABNORMAL LOW (ref 3.5–5.0)
Alkaline Phosphatase: 69 U/L (ref 38–126)
Anion gap: 9 (ref 5–15)
BUN: 20 mg/dL (ref 8–23)
CO2: 24 mmol/L (ref 22–32)
Calcium: 8.9 mg/dL (ref 8.9–10.3)
Chloride: 100 mmol/L (ref 98–111)
Creatinine, Ser: 0.98 mg/dL (ref 0.61–1.24)
GFR, Estimated: >60 mL/min (ref >60)
Glucose, Bld: 106 mg/dL — ABNORMAL HIGH (ref 70–99)
Potassium: 3.8 mmol/L (ref 3.5–5.1)
Sodium: 133 mmol/L — ABNORMAL LOW (ref 135–145)
Total Bilirubin: 1 mg/dL (ref 0.0–1.2)
Total Protein: 7.2 g/dL (ref 6.5–8.1)

## 2024-09-17 MED ORDER — JUVEN PO PACK
1.0000 | PACK | Freq: Two times a day (BID) | ORAL | Status: DC
  Administered 2024-09-17 – 2024-09-23 (×8): 1 via ORAL
  Filled 2024-09-17 (×9): qty 1

## 2024-09-17 NOTE — Progress Notes (Signed)
 Occupational Therapy Session Note  Patient Details  Name: David King MRN: [id number] Date of Birth: 1956-04-02  Today's Date: 09/17/2024 OT Individual Time: 1400-1500 OT Individual Time Calculation (min): 60 min    Short Term Goals: Week 1:  OT Short Term Goal 1 (Week 1): Pt will complete LB dressing with Min A OT Short Term Goal 2 (Week 1): Pt will complete toileting with Min A OT Short Term Goal 3 (Week 1): Pt will complete functional transfers with Min A OT Short Term Goal 4 (Week 1): Pt will don/doff TLSO brace with Mod A  Skilled Therapeutic Interventions/Progress Updates:  Skilled OT session completed to address ADL retraining and pain. Pt received EOB with wife assisting, agreeable to participate in therapy. Pt reports 8/10 pain in RLE, OT provided pain intervention by repositioning and including rest breaks, at end of session pt reports decrease in pain.   Pt seated EOB, donning TLSO brace, required Min A to don BUE straps. Pt c/o burning pain in RLE, reports pain subsides when seated in WC. BP monitored prior to transfer: 118/77. EOB>WC lateral scoot transfer CGA to manage back precautions. Pt requested to void, WC><BSC stand pivot transfer with RW CGA. Toileting hygiene completed with Mod A, pt able to pull pants up over bottom standing at RW. Output: 30cc of urine. Pt requests to sit for extended time to empty bladder, reports in/out cath every 6 hours. OT encourages to continue to toilet OOB to empty bladder independently. Pt and wife verbalize understanding. Pt notes increased swelling in BLE, TED hose donned dependently.   Pt self-propels WC 267ft+, OT introduces the following AE for LB dressing, dressing stick, LH sponge and sock aid to increase independence with dressing and adherence to back precautions. Pt able to recall back precautions with independence. 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 OT  provides compensatory strategy to begin task in field of vision, displays understanding and completes return demo with CGA. Pt self-propels back to room seated in [facility] with wife present, chair alarm on, and all needs met. OT encouraged hydration to manage BP, pt displayed understanding by requesting water  at conclusion of session.  Therapy Documentation Precautions:  Precautions Precautions: None, Back Precaution Booklet Issued: Yes (comment) Recall of Precautions/Restrictions: Intact Precaution/Restrictions Comments: watch BP (orthostatic) Required Braces or Orthoses: Spinal Brace Spinal Brace: Thoracolumbosacral orthotic, Applied in sitting position Restrictions Weight Bearing Restrictions Per Provider Order: No   Therapy/Group: Individual Therapy  Nilda Keathley Woods-Chance, MS, OTR/L 09/17/2024, 8:01 AM

## 2024-09-17 NOTE — Progress Notes (Signed)
 Physical Therapy Session Note  Patient Details  Name: David King MRN: [id number] Date of Birth: 1956-09-10  Today's Date: 09/17/2024 PT Individual Time: 834-905 PT Individual Time Calculation (min): 31 min  Missed 14 minutes of therapy  PT Individual Time: 1115-1200 PT Individual Time Calculation (min): 45 min   Short Term Goals: Week 1:  PT Short Term Goal 1 (Week 1): Pt will perform w/c transfers with CGA PT Short Term Goal 2 (Week 1): Pt will amb x25' with RW CGA without rest PT Short Term Goal 3 (Week 1): Pt will perform supine<->sit transfers with CGA  Skilled Therapeutic Interventions/Progress Updates:    Pt on commode with Nsg Tech in room when PT arrived. Pt agreeable for session. Pt did not sleep well last night and is tired this morning.  Pt wheeled self to day room using UE. Pt c/o w/c seating-cushion discomfort, PT provided a new cushion to pt's liking. Pt performed sit->stand with mod assist for ant wt shifting, c/o feeling dizzy and needing to sit.  BP 131/84, HR 106 in sitting; 96/81 HR 115 in standing with RW.  Pt reports he needs to urinate, PT wheeled pt back to room, assist with doffing/donning of pants in standing.  Pt unsuccessful and c/o feeling tired and limited participation.  Pt unable to continue and apologetic. Pt left in w/c with all items within reach. Missed 14 minutes of therapy.  Second session: Pt in w/c and agreeable for session.  Pt wheeled self to main gym.  Session focusing on balance, NMR, gait tx, strengthening.  Sit to stand in // bars, CGA, once standing pt c/o dizziness.  BP 114/74; 97/82 standing.  Pt performed standing RLE hip abd/flexion 3x5 with 4# ankle wt, resting as needed. Seated LAQ RLE 4# ankle wt x10.  Standing wt shifting diagonals with assisting pelvic movement.  Pt ambulated x 35' with RW and w/c follow with supervision/CGA.  Pt wheeled self back to room, up in w/c with all items within reach.    Therapy Documentation Precautions:   Precautions Precautions: None, Back Precaution Booklet Issued: Yes (comment) Recall of Precautions/Restrictions: Intact Precaution/Restrictions Comments: watch BP (orthostatic) Required Braces or Orthoses: Spinal Brace Spinal Brace: Thoracolumbosacral orthotic, Applied in sitting position Restrictions Weight Bearing Restrictions Per Provider Order: No  Therapy Vitals Pulse Rate: 90 Resp: 18 Patient Position (if appropriate): Sitting Oxygen  Therapy O2 Device: Room Air Pain:    Therapy/Group: Individual Therapy  [name] Fast 09/17/2024, 12:39 PM

## 2024-09-17 NOTE — Progress Notes (Addendum)
 PROGRESS NOTE   Subjective/Complaints: Continues to report severe pain in his foot. Reports its improves when he sits up for a few minutes. Cymbalta  started yesterday.   Reports last bowel movement yesterday.  Reports he is emptying his bladder okay.   ROS: Per HPI Pt denies fever, chills, SOB, abd pain, CP, N/V/C/D, and vision changes  Pain uncontrolled (+)  Objective:   VAS US  LOWER EXTREMITY VENOUS (DVT) Result Date: 09/16/2024  Lower Venous DVT Study Patient Name:  David King  Date of Exam:   09/16/2024 Medical Rec #: [id number]      Accession #:    [id number] Date of Birth: Jan 01, 1956      Patient Gender: M Patient Age:   68 years Exam Location:  [facility] Procedure:      VAS US  LOWER EXTREMITY VENOUS (DVT) Referring Phys: TORIBIO PITCH --------------------------------------------------------------------------------  Indications: Swelling, Edema, and Pain.  Comparison Study: No prior exam. Performing Technologist: Edilia Elden Appl  Examination Guidelines: A complete evaluation includes B-mode imaging, spectral Doppler, color Doppler, and power Doppler as needed of all accessible portions of each vessel. Bilateral testing is considered an integral part of a complete examination. Limited examinations for reoccurring indications may be performed as noted. The reflux portion of the exam is performed with the patient in reverse Trendelenburg.  +---------+---------------+---------+-----------+----------+--------------+ RIGHT    CompressibilityPhasicitySpontaneityPropertiesThrombus Aging +---------+---------------+---------+-----------+----------+--------------+ CFV      Full           Yes      Yes                                 +---------+---------------+---------+-----------+----------+--------------+ SFJ      Full           Yes      Yes                                  +---------+---------------+---------+-----------+----------+--------------+ FV Prox  Full                                                        +---------+---------------+---------+-----------+----------+--------------+ FV Mid   Full                                                        +---------+---------------+---------+-----------+----------+--------------+ FV DistalFull                                                        +---------+---------------+---------+-----------+----------+--------------+ PFV      Full                                                        +---------+---------------+---------+-----------+----------+--------------+  POP      Full           Yes      Yes                                 +---------+---------------+---------+-----------+----------+--------------+ PTV      Full                                                        +---------+---------------+---------+-----------+----------+--------------+ PERO     Full                                                        +---------+---------------+---------+-----------+----------+--------------+   +---------+---------------+---------+-----------+----------+--------------+ LEFT     CompressibilityPhasicitySpontaneityPropertiesThrombus Aging +---------+---------------+---------+-----------+----------+--------------+ CFV      Full           Yes      Yes                                 +---------+---------------+---------+-----------+----------+--------------+ SFJ      Full           Yes      Yes                                 +---------+---------------+---------+-----------+----------+--------------+ FV Prox  Full                                                        +---------+---------------+---------+-----------+----------+--------------+ FV Mid   Full                                                         +---------+---------------+---------+-----------+----------+--------------+ FV DistalFull                                                        +---------+---------------+---------+-----------+----------+--------------+ PFV      Full                                                        +---------+---------------+---------+-----------+----------+--------------+ POP      Full           Yes      Yes                                 +---------+---------------+---------+-----------+----------+--------------+  PTV      Full                                                        +---------+---------------+---------+-----------+----------+--------------+ PERO     Full                                                        +---------+---------------+---------+-----------+----------+--------------+     Summary: BILATERAL: - No evidence of deep vein thrombosis seen in the lower extremities, bilaterally. -No evidence of popliteal cyst, bilaterally.   *See table(s) above for measurements and observations. Electronically signed by Debby Robertson on 09/16/2024 at 4:04:02 PM.    Final    Recent Labs    09/16/24 0557 09/17/24 0507  WBC 11.6* 12.3*  HGB 12.8* 13.7  HCT 37.6* 40.5  PLT 236 310   Recent Labs    09/16/24 0557 09/17/24 0507  NA 134* 133*  K 3.8 3.8  CL 97* 100  CO2 23 24  GLUCOSE 93 106*  BUN 26* 20  CREATININE 0.83 0.98  CALCIUM 8.8* 8.9    Intake/Output Summary (Last 24 hours) at 09/17/2024 1032 Last data filed at 09/17/2024 0956 Gross per 24 hour  Intake 959 ml  Output 1050 ml  Net -91 ml     Wound 09/15/24 2032 Pressure Injury Buttocks Bilateral;Right Deep Tissue Pressure Injury - Purple or maroon localized area of discolored intact skin or blood-filled blister due to damage of underlying soft tissue from pressure and/or shear. (Active)     Wound 09/15/24 1848 Pressure Injury Heel Right Deep Tissue Pressure Injury - Purple or maroon localized area of  discolored intact skin or blood-filled blister due to damage of underlying soft tissue from pressure and/or shear. (Active)    Physical Exam: Vital Signs Blood pressure 129/76, pulse 90, temperature 98 F (36.7 C), resp. rate 18, height 5' 9 (1.753 m), weight 83.2 kg, SpO2 91%.    General: awake, alert, NAD, appears fairly comfortable this morning HENT: conjugate gaze; oropharynx moist CV: regular rate and rhythm; no JVD Pulmonary: CTA B/L; nonlabored breathing GI: soft, NT, ND, (+)BS Psychiatric: Flat, appropriate and cooperative otherwise  Neurological:     Comments: Patient is alert oriented x 4 following commands. Right lower extremity strength is 4/5 proximally and 2/5 distally, LLE 4/5 throughout, decreased sensation in right foot   Assessment/Plan: 1. Functional deficits which require 3+ hours per day of interdisciplinary therapy in a comprehensive inpatient rehab setting. Physiatrist is providing close team supervision and 24 hour management of active medical problems listed below. Physiatrist and rehab team continue to assess barriers to discharge/monitor patient progress toward functional and medical goals  Care Tool:  Bathing    Body parts bathed by patient: Right arm, Left arm, Chest, Abdomen, Front perineal area, Right upper leg, Left upper leg, Face   Body parts bathed by helper: Buttocks, Left lower leg, Right lower leg     Bathing assist Assist Level: Minimal Assistance - Patient > 75%     Upper Body Dressing/Undressing Upper body dressing        Upper body assist Assist Level: Set up assist  Lower Body Dressing/Undressing Lower body dressing            Lower body assist Assist for lower body dressing: Moderate Assistance - Patient 50 - 74%     Toileting Toileting    Toileting assist Assist for toileting: Moderate Assistance - Patient 50 - 74%     Transfers Chair/bed transfer  Transfers assist  Chair/bed transfer activity did not  occur: Safety/medical concerns  Chair/bed transfer assist level: Minimal Assistance - Patient > 75%     Locomotion Ambulation   Ambulation assist      Assist level: Contact Guard/Touching assist Assistive device: Parallel bars Max distance: 8'   Walk 10 feet activity   Assist     Assist level: Contact Guard/Touching assist Assistive device: Walker-rolling   Walk 50 feet activity   Assist Walk 50 feet with 2 turns activity did not occur: Safety/medical concerns (fatigue, unsafe)         Walk 150 feet activity   Assist Walk 150 feet activity did not occur: Safety/medical concerns (fatigue, unsafe)         Walk 10 feet on uneven surface  activity   Assist Walk 10 feet on uneven surfaces activity did not occur: Safety/medical concerns (fatigue, unsafe)         Wheelchair     Assist Is the patient using a wheelchair?: Yes Type of Wheelchair: Manual    Wheelchair assist level: Total Assistance - Patient < 25% Max wheelchair distance: 400'    Wheelchair 50 feet with 2 turns activity    Assist        Assist Level: Total Assistance - Patient < 25%   Wheelchair 150 feet activity     Assist      Assist Level: Total Assistance - Patient < 25%   Blood pressure 129/76, pulse 90, temperature 98 F (36.7 C), resp. rate 18, height 5' 9 (1.753 m), weight 83.2 kg, SpO2 91%.  Medical Problem List and Plan: 1. 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  Status post L4-5 transforaminal interbody fusion/posterior lateral fusion insertion interbody device L4-5 decompression 09/08/2024 per Dr. Beuford.  Back brace when out of bed             -patient may shower             -ELOS/Goals: 10-14 days              -Continue CIR PT and OT 2.  Antithrombotics: -DVT/anticoagulation:  Mechanical: Antiembolism stockings, thigh (TED hose) Bilateral lower extremities.  Check vascular study              -antiplatelet therapy: N/A 3. Pain Management: Neurontin  600 mg 3 times daily, Robaxin  and oxycodone  as needed  9/18- Gave a 1x dose of Oxy 15 mg, Increased his Oxy to 10-15 mg q4 hours prn for pain; changed Robaxin  to 1000 mg QID, increased Gabapentin  to 900 mg TID.Also added Lidoderm   for nerve pain on RLE.    -9/19 patient reports continued pain in his leg, improved with getting up.  Does not appear to be getting oxycodone  frequently today.  Continue current regimen and monitor.  Appears more comfortable today compared to description yesterday 4. Anxiety: would benefit from neuropsych eval, magnesium  supplement started.  Ambien  as needed  9/18- Will start Duloxetine  for nerve pain 30 mg at bedtime  9/19 may take several days for duloxetine  to take effect             -  antipsychotic agents: N/A 5. Neuropsych/cognition: This patient is capable of making decisions on his own behalf. 6. Tachycardia: magnesium  supplement started      09/17/2024    3:28 PM 09/17/2024    9:22 AM 09/17/2024    3:31 AM  Vitals with BMI  Systolic 105  129  Diastolic 70  76  Pulse 99 90 91    7. Fluids/Electrolytes/Nutrition: Routine in and outs with follow-up chemistries.   8.  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  Patient responded to epi drip and Decadron .   9.  Hyperlipidemia.  Pravachol    10.  COPD.  Remote tobacco use.  Continue inhalers as directed.  Check oxygen  saturations every shift   11.  History of prostate cancer.  Status post prostatectomy.  Continue Flomax  0.4 mg daily.   12.  History of detached retina left eye.  Follow-up outpatient ophthalmology   13. Transaminitis  9/18- AST 125 and ALT 161- will stop Pravachol  20 mg daily and decrease Tylenol  to 325 mg q6 hours prn from 650 mg. Last LFTs were normal 7-8 days ago.   14. Azotemia  9/18- BUN up to 26 from 12- will push fluids and recheck in AM.   9/19 BUN decreased to 20, continue encourage oral fluids, recheck  tomorrow  15. Mild Leukocytosis  9/18- will recheck in AM- has had decadron - no Sx's of UTI/URI.   9/19 WBC a little higher today, may be due to recent Decadron , no signs of infection  16. R heel DTI  9/18- Prevalon ordered and pt has WOC consult ordered  Patient was seen by wound VAC for deep pressure injury, appreciate assistance and recommendations   LOS: 2 days A FACE TO FACE EVALUATION WAS PERFORMED  Murray Collier 09/17/2024, 10:32 AM

## 2024-09-17 NOTE — Progress Notes (Signed)
 Met with patient to review current situation, team conference and plan of care. Reviewed plan for skin, bowel program, bladder program, pain, sleep. Prevalon boots, air mattress ordered. Reviewed use of brace , incision care. Continue to follow along to provide educational needs to facilitate preparation for discharge.

## 2024-09-17 NOTE — Plan of Care (Signed)
 Nutrition Education Note: Wound Healing  RD consulted to discuss nutrition therapy for wound healing. Provided Pressure Injury Nutrition Therapy handout from the [employer]. Discussed importance of adequate intake to help meet increased calorie and protein needs for wound healing. Encouraged eating a balanced diet containing all food groups with a focus on whole grains, fruits, vegetables, and protein. Discussed importance of vitamins and minerals like vitamin C  and zinc .   Discussed different sources of protein ranging from milk, yogurt, cottage cheese, meat, beans, nuts, and peanut butter. Pt reports usual diet consists of beef, chicken, and other meat sources. Pt endorses eating dairy at most breakfasts. Pt reports PTA, he was eating 2-3 x per day depending on appetite. Pt would usually drink coffee and have fruit at breakfast then have a sandwich for lunch and a meat, vegetable and side for dinner. Pt reports if he eats out for lunch and has a big meal, he may skip dinner. Discussed importance of continued adequate intake to aid in wound healing and encouraged protein intake at every meal. Encouraged pt to continue recommended vitamin and mineral supplementation, pt also open to trying Juven while admitted. Pt asked about arginine supplementation after discharge, encouraged pt to discuss with PCP prior to taking.   Pt has no history of diabetes and recent A1c 5.2% and blood sugars well managed. Discussed importance of blood sugar management for wound healing.   Pt currently on regular diet and eating 100% of meals. Reviewed medications and labs. No further nutrition interventions planned at this time. Please re-consult if needed.   Josette Glance, MS, RDN, LDN Clinical Dietitian I Please reach out via secure chat

## 2024-09-17 NOTE — Progress Notes (Signed)
 Occupational Therapy Session Note  Patient Details  Name: David King MRN: [id number] Date of Birth: 03/24/1956  Today's Date: 09/17/2024 OT Individual Time: 9052-8955 OT Individual Time Calculation (min): 57 min    Short Term Goals: Week 1:  OT Short Term Goal 1 (Week 1): Pt will complete LB dressing with Min A OT Short Term Goal 2 (Week 1): Pt will complete toileting with Min A OT Short Term Goal 3 (Week 1): Pt will complete functional transfers with Min A OT Short Term Goal 4 (Week 1): Pt will don/doff TLSO brace with Mod A   Skilled Therapeutic Interventions/Progress Updates:    Pt bed level at time of session, very verbose and recounting for therapist pain in RLE, recounting back surgeries, etc and attempted to redirect to tasks. Pt reports roughly 7/10 and reduced to 3-4/10 with movement. Attempted different positional changes to alleviate pain, RLE abd/add, bringing to chest, AAROM, etc with no change. Pt did need to use the bathroom and supine > sit with MOD A for trunk, donning TLSO MIN A and cues for technique. BP checked seated at 124/81, and after all activity at end of session 116/80. Minimal change with activity. Stand pivot bed > wheelchair <> toilet with RW initially and grab bar for second transfer, MIN A overall consistently. Note continent void of urine, NT notified, and OT assisting with clothing management 2/2 fatigue and difficulty standing for long periods of time. Pt feeling dizzy at this time and checked BP, see above. Pt wanting to sit up at this time for next PT session in 30 mins. Alarm on call bell in reach all needs met.   Therapy Documentation Precautions:  Precautions Precautions: None, Back Precaution Booklet Issued: Yes (comment) Recall of Precautions/Restrictions: Intact Precaution/Restrictions Comments: watch BP (orthostatic) Required Braces or Orthoses: Spinal Brace Spinal Brace: Thoracolumbosacral orthotic, Applied in sitting  position Restrictions Weight Bearing Restrictions Per Provider Order: No    Therapy/Group: Individual Therapy  David King 09/17/2024, 7:27 AM

## 2024-09-18 DIAGNOSIS — M5416 Radiculopathy, lumbar region: Secondary | ICD-10-CM | POA: Diagnosis not present

## 2024-09-18 DIAGNOSIS — D72829 Elevated white blood cell count, unspecified: Secondary | ICD-10-CM | POA: Diagnosis not present

## 2024-09-18 DIAGNOSIS — R7989 Other specified abnormal findings of blood chemistry: Secondary | ICD-10-CM | POA: Diagnosis not present

## 2024-09-18 LAB — BASIC METABOLIC PANEL WITH GFR
Anion gap: 13 (ref 5–15)
BUN: 19 mg/dL (ref 8–23)
CO2: 20 mmol/L — ABNORMAL LOW (ref 22–32)
Calcium: 8.8 mg/dL — ABNORMAL LOW (ref 8.9–10.3)
Chloride: 95 mmol/L — ABNORMAL LOW (ref 98–111)
Creatinine, Ser: 0.72 mg/dL (ref 0.61–1.24)
GFR, Estimated: >60 mL/min (ref >60)
Glucose, Bld: 104 mg/dL — ABNORMAL HIGH (ref 70–99)
Potassium: 4 mmol/L (ref 3.5–5.1)
Sodium: 128 mmol/L — ABNORMAL LOW (ref 135–145)

## 2024-09-18 LAB — CBC
HCT: 37.7 % — ABNORMAL LOW (ref 39.0–52.0)
Hemoglobin: 13 g/dL (ref 13.0–17.0)
MCH: 33.2 pg (ref 26.0–34.0)
MCHC: 34.5 g/dL (ref 30.0–36.0)
MCV: 96.2 fL (ref 80.0–100.0)
Platelets: 290 K/uL (ref 150–400)
RBC: 3.92 MIL/uL — ABNORMAL LOW (ref 4.22–5.81)
RDW: 13.2 % (ref 11.5–15.5)
WBC: 11.7 K/uL — ABNORMAL HIGH (ref 4.0–10.5)
nRBC: 0 % (ref 0.0–0.2)

## 2024-09-18 MED ORDER — PREGABALIN 75 MG PO CAPS
150.0000 mg | ORAL_CAPSULE | Freq: Three times a day (TID) | ORAL | Status: DC
  Administered 2024-09-18 – 2024-09-23 (×17): 150 mg via ORAL
  Filled 2024-09-18 (×18): qty 2

## 2024-09-18 NOTE — IPOC Note (Signed)
 Overall Plan of Care [facility]) Patient Details Name: David King MRN: [id number] DOB: 25-Nov-1956  Admitting Diagnosis: Lumbar radiculopathy  Hospital Problems: Principal Problem:   Lumbar radiculopathy     Functional Problem List: Nursing Bladder, Bowel, Edema, Endurance, Medication Management, Pain, Safety, Skin Integrity  PT Balance, Edema, Endurance, Motor, Pain, Safety, Sensory, Skin Integrity  OT Balance, Endurance, Motor, Pain  SLP    TR         Basic ADL's: OT Bathing, Dressing, Toileting     Advanced  ADL's: OT None     Transfers: PT Bed Mobility, Bed to Chair, Car  OT Toilet     Locomotion: PT Ambulation, Wheelchair Mobility     Additional Impairments: OT None  SLP        TR      Anticipated Outcomes Item Anticipated Outcome  Self Feeding Mod-I  Swallowing      Basic self-care  Supervision  Toileting  Supervision   Bathroom Transfers Supervision  Bowel/Bladder  manage bowels with medications/ manage bladder with medications  Transfers  mod i  Locomotion  mod i  Communication     Cognition     Pain  <4 w/ prns  Safety/Judgment  manage safety with supervision   Therapy Plan: PT Intensity: Minimum of 1-2 x/day ,45 to 90 minutes PT Frequency: 5 out of 7 days PT Duration Estimated Length of Stay: 10-14 days OT Intensity: Minimum of 1-2 x/day, 45 to 90 minutes OT Frequency: 5 out of 7 days OT Duration/Estimated Length of Stay: 10-14 days     Team Interventions: Nursing Interventions Patient/Family Education, Bladder Management, Bowel Management, Disease Management/Prevention, Pain Management, Discharge Planning, Skin Care/Wound Management  PT interventions Ambulation/gait training, Discharge planning, Functional mobility training, Psychosocial support, Therapeutic Activities, Wheelchair propulsion/positioning, Therapeutic Exercise, Skin care/wound management, Neuromuscular re-education, Disease management/prevention, Designer, jewellery, DME/adaptive equipment instruction, Pain management, Splinting/orthotics, Community reintegration, Functional electrical stimulation, UE/LE Strength taining/ROM, Patient/family education, Stair training, UE/LE Coordination activities  OT Interventions Balance/vestibular training, Discharge planning, Pain management, Functional electrical stimulation, Self Care/advanced ADL retraining, Therapeutic Activities, Skin care/wound managment, Patient/family education, Functional mobility training, Disease mangement/prevention, Therapeutic Exercise, DME/adaptive equipment instruction, UE/LE Strength taining/ROM, Wheelchair propulsion/positioning, UE/LE Coordination activities, Visual/perceptual remediation/compensation, Psychosocial support, Neuromuscular re-education, Community reintegration  SLP Interventions    TR Interventions    SW/CM Interventions Discharge Planning, Psychosocial Support, Patient/Family Education   Barriers to Discharge MD  Medical stability and Wound care  Nursing Decreased caregiver support, Home environment access/layout, Neurogenic Bowel & Bladder Discharge: House  Discharge Home Layout: One level  Discharge Home Access: Stairs to enter  Entrance Stairs-Rails: Can reach both  Entrance Stairs-Number of Steps: 4  PT None    OT Neurogenic Bowel & Bladder    SLP      SW Neurogenic Bowel & Bladder, Insurance for SNF coverage     Team Discharge Planning: Destination: PT-Home ,OT- Home , SLP-  Projected Follow-up: PT-Outpatient PT, OT-  Home health OT, SLP-  Projected Equipment Needs: PT-To be determined, OT- 3 in 1 bedside comode, SLP-  Equipment Details: PT- , OT-  Patient/family involved in discharge planning: PT- Patient,  OT-Patient, Family member/caregiver, SLP-   MD ELOS: 10-14 Medical Rehab Prognosis:  Excellent Assessment: The patient has been admitted for CIR therapies with the diagnosis of lumbar radiculopathy/large disc herniation with spinal stenosis.  Status post L4-5 transforaminal interbody fusion/posterior lateral fusion insertion interbody device L4-5 decompression . The team will be addressing functional mobility, strength, stamina, balance, safety, adaptive  techniques and equipment, self-care, bowel and bladder mgt, patient and caregiver education. Goals have been set at sup. Anticipated discharge destination is home.        See Team Conference Notes for weekly updates to the plan of care

## 2024-09-18 NOTE — Progress Notes (Addendum)
 PROGRESS NOTE   Subjective/Complaints: Continues to have pain in his foot.  Improved with sitting.  Oxycodone  helps a little he will use it very frequently.  LBM today- a little loose.   ROS: Per HPI Pt denies fever, chills, SOB, abd pain, CP, N/V/C/D, and vision changes.  Denies new motor or sensory changes  Pain uncontrolled (+)  Objective:   No results found.  Recent Labs    09/16/24 0557 09/17/24 0507  WBC 11.6* 12.3*  HGB 12.8* 13.7  HCT 37.6* 40.5  PLT 236 310   Recent Labs    09/17/24 0507 09/18/24 1233  NA 133* 128*  K 3.8 4.0  CL 100 95*  CO2 24 20*  GLUCOSE 106* 104*  BUN 20 19  CREATININE 0.98 0.72  CALCIUM  8.9 8.8*    Intake/Output Summary (Last 24 hours) at 09/18/2024 1407 Last data filed at 09/18/2024 0759 Gross per 24 hour  Intake 356 ml  Output 1250 ml  Net -894 ml     Wound 09/15/24 2032 Pressure Injury Buttocks Bilateral;Right Deep Tissue Pressure Injury - Purple or maroon localized area of discolored intact skin or blood-filled blister due to damage of underlying soft tissue from pressure and/or shear. (Active)     Wound 09/15/24 1848 Pressure Injury Heel Right Deep Tissue Pressure Injury - Purple or maroon localized area of discolored intact skin or blood-filled blister due to damage of underlying soft tissue from pressure and/or shear. (Active)    Physical Exam: Vital Signs Blood pressure 124/73, pulse 87, temperature 98.9 F (37.2 C), temperature source Oral, resp. rate 18, height 5' 9 (1.753 m), weight 83.2 kg, SpO2 97%.    General: awake, alert, NAD, sitting in wheelchair in his room HENT: conjugate gaze; oropharynx moist CV: regular rate and rhythm; no JVD Pulmonary: CTA B/L; nonlabored breathing GI: soft, NT, ND, (+)BS Psychiatric: Flat, appropriate and cooperative otherwise  Neurological:     Comments: Patient is alert oriented x 4 following commands. Right lower  extremity strength is 4/5 proximally and 2/5 distally, LLE 4/5 throughout, decreased sensation in right foot  Prior neuro assessment is c/w today's exam 09/18/2024.    Assessment/Plan: 1. Functional deficits which require 3+ hours per day of interdisciplinary therapy in a comprehensive inpatient rehab setting. Physiatrist is providing close team supervision and 24 hour management of active medical problems listed below. Physiatrist and rehab team continue to assess barriers to discharge/monitor patient progress toward functional and medical goals  Care Tool:  Bathing    Body parts bathed by patient: Right arm, Left arm, Chest, Abdomen, Front perineal area, Right upper leg, Left upper leg, Face   Body parts bathed by helper: Buttocks, Left lower leg, Right lower leg     Bathing assist Assist Level: Minimal Assistance - Patient > 75%     Upper Body Dressing/Undressing Upper body dressing        Upper body assist Assist Level: Set up assist    Lower Body Dressing/Undressing Lower body dressing            Lower body assist Assist for lower body dressing: Moderate Assistance - Patient 50 - 74%     Toileting Toileting  Toileting assist Assist for toileting: Moderate Assistance - Patient 50 - 74%     Transfers Chair/bed transfer  Transfers assist  Chair/bed transfer activity did not occur: Safety/medical concerns  Chair/bed transfer assist level: Minimal Assistance - Patient > 75%     Locomotion Ambulation   Ambulation assist      Assist level: 2 helpers Assistive device: Walker-rolling Max distance: 13'   Walk 10 feet activity   Assist     Assist level: 2 helpers Assistive device: Walker-rolling   Walk 50 feet activity   Assist Walk 50 feet with 2 turns activity did not occur: Safety/medical concerns (fatigue, unsafe)         Walk 150 feet activity   Assist Walk 150 feet activity did not occur: Safety/medical concerns (fatigue,  unsafe)         Walk 10 feet on uneven surface  activity   Assist Walk 10 feet on uneven surfaces activity did not occur: Safety/medical concerns (fatigue, unsafe)         Wheelchair     Assist Is the patient using a wheelchair?: Yes Type of Wheelchair: Manual    Wheelchair assist level: Supervision/Verbal cueing Max wheelchair distance: 440'    Wheelchair 50 feet with 2 turns activity    Assist        Assist Level: Supervision/Verbal cueing   Wheelchair 150 feet activity     Assist      Assist Level: Supervision/Verbal cueing   Blood pressure 124/73, pulse 87, temperature 98.9 F (37.2 C), temperature source Oral, resp. rate 18, height 5' 9 (1.753 m), weight 83.2 kg, SpO2 97%.  Medical Problem List and Plan: 1. 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  Status post L4-5 transforaminal interbody fusion/posterior lateral fusion insertion interbody device L4-5 decompression 09/08/2024 per Dr. Beuford.  Back brace when out of bed             -patient may shower             -ELOS/Goals: 10-14 days              -Continue CIR PT and OT 2.  Antithrombotics: -DVT/anticoagulation:  Mechanical: Antiembolism stockings, thigh (TED hose) Bilateral lower extremities.  Check vascular study             -antiplatelet therapy: N/A 3. Pain Management: Neurontin  600 mg 3 times daily, Robaxin  and oxycodone  as needed  9/18- Gave a 1x dose of Oxy 15 mg, Increased his Oxy to 10-15 mg q4 hours prn for pain; changed Robaxin  to 1000 mg QID, increased Gabapentin  to 900 mg TID.Also added Lidoderm   for nerve pain on RLE.    -9/19 patient reports continued pain in his leg, improved with getting up.  Does not appear to be getting oxycodone  frequently today.  Continue current regimen and monitor.  Appears more comfortable today compared to description yesterday  9/20 will change gabapentin  to Lyrica  3 times daily 150 mg for  neuropathic pain 4. Anxiety: would benefit from neuropsych eval, magnesium  supplement started.  Ambien  as needed  9/18- Will start Duloxetine  for nerve pain 30 mg at bedtime  9/19 may take several days for duloxetine  to take effect             -antipsychotic agents: N/A 5. Neuropsych/cognition: This patient is capable of making decisions on his own behalf. 6. Tachycardia: magnesium  supplement started   -9/20 HR in the 80s and 90s continue to monitor  09/18/2024    5:17 AM 09/17/2024    7:09 PM 09/17/2024    3:28 PM  Vitals with BMI  Systolic 124 119 894  Diastolic 73 77 70  Pulse 87 99 99    7. Fluids/Electrolytes/Nutrition: Routine in and outs with follow-up chemistries.   8.  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  Patient responded to epi drip and Decadron .   9.  Hyperlipidemia.  Pravachol    10.  COPD.  Remote tobacco use.  Continue inhalers as directed.  Check oxygen  saturations every shift   11.  History of prostate cancer.  Status post prostatectomy.  Continue Flomax  0.4 mg daily.   12.  History of detached retina left eye.  Follow-up outpatient ophthalmology   13. Transaminitis  9/18- AST 125 and ALT 161- will stop Pravachol  20 mg daily and decrease Tylenol  to 325 mg q6 hours prn from 650 mg. Last LFTs were normal 7-8 days ago.   14. Azotemia  9/18- BUN up to 26 from 12- will push fluids and recheck in AM.   9/19 BUN decreased to 20, continue encourage oral fluids, recheck tomorrow  9/20 BUN and creatinine improved today, continue current  15. Mild Leukocytosis  9/18- will recheck in AM- has had decadron - no Sx's of UTI/URI.   9/19 WBC a little higher today, may be due to recent Decadron , no signs of infection  9/20 no signs infection noted continue to monitor  16. R heel DTI  9/18- Prevalon ordered and pt has WOC consult ordered  Patient was seen by wound VAC for deep pressure injury, appreciate assistance and recommendations 17. Loose  Bms  -hold miralax   LOS: 3 days A FACE TO FACE EVALUATION WAS PERFORMED  Murray Collier 09/18/2024, 2:07 PM

## 2024-09-18 NOTE — Progress Notes (Signed)
 Physical Therapy Session Note  Patient Details  Name: David King MRN: [id number] Date of Birth: [birth year]  Today's Date: 09/18/2024 PT Individual Time: 1400-1445 PT Individual Time Calculation (min): 45 min   Short Term Goals: Week 1:  PT Short Term Goal 1 (Week 1): Pt will perform w/c transfers with CGA PT Short Term Goal 2 (Week 1): Pt will amb x25' with RW CGA without rest PT Short Term Goal 3 (Week 1): Pt will perform supine<->sit transfers with CGA  Skilled Therapeutic Interventions/Progress Updates:  Pt in w/c when PT arrived. Pt agreeable for session.  Pt verbalizes uncertainty of progress, PT encouraged pt throughout session, focusing on his progress. Pt wheeled self to day gym.  Gait tx with RW with w/c following, pt amb x46' with x1 rest break.  Pt very pleased.  Pt c/o dizziness in standing, however, not limiting pt's performance with PT.  Pt reports fatigue and required extended rest break.  Pt performed seated BLE ex's with 1.5# ankle wts: LAQ, marching 2x10.  Pt wheeled self back to room, needed to use BSC. Notified Nsg.   Therapy Documentation Precautions:  Precautions Precautions: None, Back Precaution Booklet Issued: Yes (comment) Recall of Precautions/Restrictions: Intact Precaution/Restrictions Comments: watch BP (orthostatic) Required Braces or Orthoses: Spinal Brace Spinal Brace: Thoracolumbosacral orthotic, Applied in sitting position Restrictions Weight Bearing Restrictions Per Provider Order: No General:    Pain: 7/10 RLE      Therapy/Group: Individual Therapy  David King Fast 09/18/2024, 4:36 PM

## 2024-09-19 DIAGNOSIS — D72829 Elevated white blood cell count, unspecified: Secondary | ICD-10-CM | POA: Diagnosis not present

## 2024-09-19 DIAGNOSIS — R7989 Other specified abnormal findings of blood chemistry: Secondary | ICD-10-CM | POA: Diagnosis not present

## 2024-09-19 DIAGNOSIS — M5416 Radiculopathy, lumbar region: Secondary | ICD-10-CM | POA: Diagnosis not present

## 2024-09-19 DIAGNOSIS — E871 Hypo-osmolality and hyponatremia: Secondary | ICD-10-CM

## 2024-09-19 DIAGNOSIS — I70201 Unspecified atherosclerosis of native arteries of extremities, right leg: Secondary | ICD-10-CM

## 2024-09-19 DIAGNOSIS — M79661 Pain in right lower leg: Secondary | ICD-10-CM

## 2024-09-19 DIAGNOSIS — M79604 Pain in right leg: Secondary | ICD-10-CM

## 2024-09-19 DIAGNOSIS — I714 Abdominal aortic aneurysm, without rupture, unspecified: Secondary | ICD-10-CM

## 2024-09-19 LAB — CBC
HCT: 37 % — ABNORMAL LOW (ref 39.0–52.0)
Hemoglobin: 12.5 g/dL — ABNORMAL LOW (ref 13.0–17.0)
MCH: 32.6 pg (ref 26.0–34.0)
MCHC: 33.8 g/dL (ref 30.0–36.0)
MCV: 96.6 fL (ref 80.0–100.0)
Platelets: 262 K/uL (ref 150–400)
RBC: 3.83 MIL/uL — ABNORMAL LOW (ref 4.22–5.81)
RDW: 13.2 % (ref 11.5–15.5)
WBC: 11.1 K/uL — ABNORMAL HIGH (ref 4.0–10.5)
nRBC: 0 % (ref 0.0–0.2)

## 2024-09-19 LAB — BASIC METABOLIC PANEL WITH GFR
Anion gap: 10 (ref 5–15)
BUN: 18 mg/dL (ref 8–23)
CO2: 25 mmol/L (ref 22–32)
Calcium: 9 mg/dL (ref 8.9–10.3)
Chloride: 95 mmol/L — ABNORMAL LOW (ref 98–111)
Creatinine, Ser: 0.87 mg/dL (ref 0.61–1.24)
GFR, Estimated: >60 mL/min (ref >60)
Glucose, Bld: 139 mg/dL — ABNORMAL HIGH (ref 70–99)
Potassium: 4 mmol/L (ref 3.5–5.1)
Sodium: 130 mmol/L — ABNORMAL LOW (ref 135–145)

## 2024-09-19 MED ORDER — SODIUM CHLORIDE 1 G PO TABS
1.0000 g | ORAL_TABLET | Freq: Two times a day (BID) | ORAL | Status: DC
  Administered 2024-09-19 – 2024-09-23 (×9): 1 g via ORAL
  Filled 2024-09-19 (×10): qty 1

## 2024-09-19 NOTE — Consult Note (Signed)
 Hospital Consult  Reason for Consult: Right lower extremity pain Referring Physician: Dr. Urbano MRN #:  [id number]  History of Present Illness: This is a 68 y.o. male with vascular history of initial ALIF exposure for L5-S1 interbody fusion for left leg pain.  This was performed in 2023.  After the procedure he continued to have cramping of the left lower extremity was found to have occluded left common iliac and left common femoral arteries with diminutive right external iliac artery.  He underwent left common femoral endarterectomy with bilateral common iliac artery stenting and left external iliac artery stenting in November 2023.  All of this performed at [facility].  Most recently he has undergone L4-5 decompression from posterior approach on September 10 less than 2 weeks ago performed for persistent left lower extremity pain and weakness.  This was complicated by anaphylaxis secondary to unknown medication.  Ultimately the surgery was completed.  Since that time he states that he has had severe pain with associated numbness of the right lower extremity from the knee caudally and has foot drop with numbness of the plantar aspect of the foot.  He has severe pain with even light touch.  He does have skin changes of the right great toe and heel although I was unable to evaluate this today secondary to pain with examination.  Pain is associated with edema he has been evaluated for DVT and was ruled out.  Currently not on any antiplatelet medications.  He has been followed closely by Dr. Georgina at [facility] as recently as May of this year where he had 2+ bilateral common femoral pulses and palpable dorsalis pedis and posterior tibials on the right although nonpalpable on the left with stable ABIs greater than 1 on the right and 0.8 on the left.  Past Medical History:  Diagnosis Date   Anxiety    new dx   Arthritis    lumbar   Atherosclerotic vascular disease    Cataract    COPD (chronic obstructive  pulmonary disease) (HCC)    Diverticulosis    Elevated PSA    Headache(784.0)    MIGRAINES   Hypertension 2021   Iritis    CHRONIC IN LEFT EYE - SOME VISIAL IMPAIRMENT IN LEFT EYE   Neuromuscular disorder (HCC)    left leg/foot,pinched siactic nerve   Pain    PAIN LEFT HIP AND DOWN LT LEG WITH NUMBNESS IN LEFT LEG--PT STATES SCIATIC NERVE IMPINGEMENT - PT PLANS BACK IN THE NEAR SURGERY.   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   Renal cysts, acquired, bilateral 03/19/2013   several simple , CT   Urinary frequency    AND NOCTURIA    Past Surgical History:  Procedure Laterality Date   BACK SURGERY     CARDIAC CATHETERIZATION  03/10/2018   CATARACT EXTRACTION W/ INTRAOCULAR LENS IMPLANT Left    EYE SURGERY     RETINAL SURGERY LEFT EYE   HYDROCELE EXCISION Left 03/05/2013   Procedure: HYDROCELECTOMY ADULT;  Surgeon: Donnice Gwenyth Brooks, MD;  Location: [facility];  Service: Urology;  Laterality: Left;   INCISIONAL HERNIA REPAIR N/A 06/18/2022   Procedure: REPAIR OF INTERNAL HERNIA;  Surgeon: Rubin Calamity, MD;  Location: [facility] OR;  Service: General;  Laterality: N/A;   INGUINAL HERNIA REPAIR Bilateral AGE 68   INSERTION OF MESH N/A 06/11/2022   Procedure: INSERTION OF MESH;  Surgeon: Rubin Calamity, MD;  Location: [facility] OR;  Service: General;  Laterality: N/A;   INSERTION OF MESH N/A 06/18/2022   Procedure: INSERTION OF MESH;  Surgeon: Rubin Calamity, MD;  Location: [facility] OR;  Service: General;  Laterality: N/A;   KYPHOPLASTY N/A 05/08/2023   Procedure: THORACIC SEVEN KYPHOPLASTY;  Surgeon: Beuford Anes, MD;  Location: [facility] OR;  Service: Orthopedics;  Laterality: N/A;   LAPAROSCOPY N/A 06/18/2022   Procedure: LAPAROSCOPY DIAGNOSTIC;  Surgeon: Rubin Calamity, MD;  Location: [facility] OR;  Service: General;  Laterality: N/A;   LYMPHADENECTOMY Bilateral 05/06/2013   Procedure: REDGIE;  Surgeon: Noretta Ferrara, MD;   Location: [facility];  Service: Urology;  Laterality: Bilateral;   PROSTATE BIOPSY N/A 03/05/2013   Procedure: PROSTATE BIOPSY and ultrasound;  Surgeon: Donnice Gwenyth Brooks, MD;  Location: [facility]  Service: Urology;  Laterality: N/A;   REMOVAL BURSA SAC, LEFT ELBOW  1996   ROBOT ASSISTED LAPAROSCOPIC RADICAL PROSTATECTOMY N/A 05/06/2013   Procedure: ROBOTIC ASSISTED LAPAROSCOPIC RADICAL PROSTATECTOMY LEVEL 3;  Surgeon: Noretta Ferrara, MD;  Location: [facility];  Service: Urology;  Laterality: N/A;   TONSILLECTOMY     as achild   TRANSFORAMINAL LUMBAR INTERBODY FUSION (TLIF) WITH PEDICLE SCREW FIXATION 1 LEVEL Left 09/08/2024   Procedure: LEFT-SIDED LUMBAR 4- LUMBAR 5 TRANSFORAMINAL LUMBAR INTERBODY FUSION AND DECOMPRESSION WITH INSTRUMENTATION AND ALLOGRAFT;  Surgeon: Beuford Anes, MD;  Location: [facility] OR;  Service: Orthopedics;  Laterality: Left;  LEFT-SIDED LUMBAR 4- LUMBAR 5 TRANSFORAMINAL LUMBAR INTERBODY FUSION AND DECOMPRESSION WITH INSTRUMENTATION AND ALLOGRAFT   XI ROBOTIC ASSISTED VENTRAL HERNIA N/A 06/11/2022   Procedure: ROBOTIC INCISIONAL HERNIA REPAIR WITH MESH;  Surgeon: Rubin Calamity, MD;  Location: [facility] OR;  Service: General;  Laterality: N/A;    Allergies  Allergen Reactions   Ancef  [Cefazolin ] Anaphylaxis    Prior to Admission medications   Medication Sig Start Date End Date Taking? Authorizing Provider  albuterol  (VENTOLIN  HFA) 108 (90 Base) MCG/ACT inhaler USE 1-2 PUFFS EVERY 6 HOURS AS NEEDED FOR WHEEZING OR SHORTNESS OF BREATH. 01/03/22   Mannam, Praveen, MD  cholecalciferol (VITAMIN D3) 25 MCG (1000 UNIT) tablet Take 1,000 Units by mouth daily.    [provider]  [employer] Extract [phone] PSORIASIS MEDICATED EX) Apply 1 Application topically 3 (three) times daily as needed (psoriasis).    [provider]  diphenhydramine -acetaminophen  (TYLENOL  PM EXTRA STRENGTH) 25-500 MG TABS tablet Take 2 tablets by mouth at bedtime.    [provider]  gabapentin  (NEURONTIN ) 300 MG capsule Take 600 mg by mouth 2 (two) times daily.    [provider]  ipratropium (ATROVENT ) 0.03 % nasal spray Place 2 sprays into both nostrils 2 (two) times daily. 02/10/24   [provider]  pravastatin  (PRAVACHOL ) 20 MG tablet Take 20 mg by mouth in the morning. 04/24/22   [provider]  TRELEGY ELLIPTA  100-62.5-25 MCG/ACT AEPB USE 1 INHALATION BY MOUTH INTO  THE LUNGS ONCE DAILY AT THE SAME TIME EACH DAY 06/09/23   Mannam, Praveen, MD    Social History   Socioeconomic History   Marital status: Married    Spouse name: Not on file   Number of children: Not on file   Years of education: Not on file   Highest education level: Not on file  Occupational History   Not on file  Tobacco Use   Smoking status: Former    Current packs/day: 0.00    Average packs/day: 2.0 packs/day for 47.0 years (94.0 ttl pk-yrs)    Types: Cigarettes    Start date: 12/31/1969  Quit date: 12/31/2016    Years since quitting: 7.7   Smokeless tobacco: Never   Tobacco comments:    Patient is currently smoke free since January 2018  Vaping Use   Vaping status: Never Used  Substance and Sexual Activity   Alcohol use: Yes    Alcohol/week: 14.0 standard drinks of alcohol    Types: 14 Shots of liquor per week    Comment: couple of drinks a night   Drug use: No   Sexual activity: Never  Other Topics Concern   Not on file  Social History Narrative   Not on file   Social Drivers of Health   Financial Resource Strain: Low Risk  (05/05/2023)   Received from [facility]   Overall Financial Resource Strain (CARDIA)    Difficulty of Paying Living Expenses: Not hard at all  Food Insecurity: Patient Unable To Answer (09/08/2024)   Hunger Vital Sign    Worried About Programme researcher, broadcasting/film/video in the Last Year: Patient unable to answer    Ran Out of Food in the Last Year: Patient unable to answer  Transportation Needs: Patient Unable To Answer (09/08/2024)    PRAPARE - Transportation    Lack of Transportation (Medical): Patient unable to answer    Lack of Transportation (Non-Medical): Patient unable to answer  Physical Activity: Unknown (05/05/2023)   Received from [facility]   Exercise Vital Sign    On average, how many days per week do you engage in moderate to strenuous exercise (like a brisk walk)?: 0 days    Minutes of Exercise per Session: Not on file  Stress: No Stress Concern Present (05/05/2023)   Received from [facility] - Occupational Stress Questionnaire    Feeling of Stress : Only a little  Social Connections: Patient Unable To Answer (09/08/2024)   Social Connection and Isolation Panel    Frequency of Communication with Friends and Family: Patient unable to answer    Frequency of Social Gatherings with Friends and Family: Patient unable to answer    Attends Religious Services: Patient unable to answer    Active Member of Clubs or Organizations: Patient unable to answer    Attends Banker Meetings: Patient unable to answer    Marital Status: Patient unable to answer  Intimate Partner Violence: Patient Unable To Answer (09/08/2024)   Humiliation, Afraid, Rape, and Kick questionnaire    Fear of Current or Ex-Partner: Patient unable to answer    Emotionally Abused: Patient unable to answer    Physically Abused: Patient unable to answer    Sexually Abused: Patient unable to answer     Family History  Problem Relation Age of Onset   Cancer Father        prostate cancer   Cancer Paternal Grandfather        prostate   Colon cancer Neg Hx    Colon polyps Neg Hx    Esophageal cancer Neg Hx    Rectal cancer Neg Hx    Stomach cancer Neg Hx     Review of Systems  Constitutional: Negative.   HENT: Negative.    Eyes: Negative.   Cardiovascular:  Positive for leg swelling.  Gastrointestinal: Negative.   Musculoskeletal:        Severe leg pain as above  Skin: Negative.    Neurological:  Positive for sensory change and focal weakness.  Psychiatric/Behavioral: Negative.        Physical Examination  Vitals:   09/19/24 0315 09/19/24 1651  BP: (!) 126/56 138/77  Pulse: 84 87  Resp: 16 18  Temp: 98.3 F (36.8 C) 98.7 F (37.1 C)  SpO2: 96% 97%   Body mass index is 27.09 kg/m.  Physical Exam HENT:     Head: Normocephalic.     Nose: Nose normal.  Eyes:     Pupils: Pupils are equal, round, and reactive to light.  Cardiovascular:     Pulses:          Femoral pulses are 0 on the right side and 0 on the left side. Pulmonary:     Effort: Pulmonary effort is normal.  Abdominal:     General: Abdomen is flat.     Palpations: Abdomen is soft.  Musculoskeletal:     Right lower leg: Edema present.     Left lower leg: No edema.     Comments: Patient with severe pain even to light touch around the ankle and foot  Skin:    Comments: His right leg is cool to the touch at the level of the ankle  Neurological:     Mental Status: He is alert.     Sensory: Sensory deficit present.     Motor: Weakness present.  Psychiatric:        Behavior: Behavior normal.      CBC    Component Value Date/Time   WBC 11.1 (H) 09/19/2024 1312   RBC 3.83 (L) 09/19/2024 1312   HGB 12.5 (L) 09/19/2024 1312   HCT 37.0 (L) 09/19/2024 1312   PLT 262 09/19/2024 1312   MCV 96.6 09/19/2024 1312   MCH 32.6 09/19/2024 1312   MCHC 33.8 09/19/2024 1312   RDW 13.2 09/19/2024 1312   LYMPHSABS 1.9 09/17/2024 0507   MONOABS 0.9 09/17/2024 0507   EOSABS 0.1 09/17/2024 0507   BASOSABS 0.1 09/17/2024 0507    BMET    Component Value Date/Time   NA 130 (L) 09/19/2024 1312   K 4.0 09/19/2024 1312   CL 95 (L) 09/19/2024 1312   CO2 25 09/19/2024 1312   GLUCOSE 139 (H) 09/19/2024 1312   BUN 18 09/19/2024 1312   CREATININE 0.87 09/19/2024 1312   CREATININE 1.06 10/10/2020 0915   CALCIUM  9.0 09/19/2024 1312   GFRNONAA >60 09/19/2024 1312   GFRNONAA 73 10/10/2020 0915    GFRAA 84 10/10/2020 0915    COAGS: Lab Results  Component Value Date   INR 1.2 08/22/2022   INR 1.1 06/13/2022     Non-Invasive Vascular Imaging:   RIGHT    CompressibilityPhasicitySpontaneityPropertiesThrombus  Aging  +---------+---------------+---------+-----------+----------+--------------+   CFV     Full           Yes      Yes                                   +---------+---------------+---------+-----------+----------+--------------+   SFJ     Full           Yes      Yes                                   +---------+---------------+---------+-----------+----------+--------------+   FV Prox  Full                                                          +---------+---------------+---------+-----------+----------+--------------+  FV Mid   Full                                                          +---------+---------------+---------+-----------+----------+--------------+   FV DistalFull                                                          +---------+---------------+---------+-----------+----------+--------------+   PFV     Full                                                          +---------+---------------+---------+-----------+----------+--------------+   POP     Full           Yes      Yes                                   +---------+---------------+---------+-----------+----------+--------------+   PTV     Full                                                          +---------+---------------+---------+-----------+----------+--------------+   PERO    Full                                                          +---------+---------------+---------+-----------+----------+--------------+          +---------+---------------+---------+-----------+----------+--------------+   LEFT    CompressibilityPhasicitySpontaneityPropertiesThrombus  Aging   +---------+---------------+---------+-----------+----------+--------------+   CFV     Full           Yes      Yes                                   +---------+---------------+---------+-----------+----------+--------------+   SFJ     Full           Yes      Yes                                   +---------+---------------+---------+-----------+----------+--------------+   FV Prox  Full                                                          +---------+---------------+---------+-----------+----------+--------------+   FV Mid   Full                                                          +---------+---------------+---------+-----------+----------+--------------+  FV DistalFull                                                          +---------+---------------+---------+-----------+----------+--------------+   PFV     Full                                                          +---------+---------------+---------+-----------+----------+--------------+   POP     Full           Yes      Yes                                   +---------+---------------+---------+-----------+----------+--------------+   PTV     Full                                                          +---------+---------------+---------+-----------+----------+--------------+   PERO    Full                                                          +---------+---------------+---------+-----------+----------+--------------+       Summary:  BILATERAL:  - No evidence of deep vein thrombosis seen in the lower extremities,  bilaterally.  -No evidence of popliteal cyst, bilaterally.   Right lower extremity arterial duplex and ABIs ordered   ASSESSMENT/PLAN: This is a 68 y.o. male with history as above.  Does appear to have a different pulse exam than his most recent examination with Dr. Georgina at [facility] in May as I cannot appreciate femoral  pulses on either side.  I was unable to evaluate below the knee secondary to excruciating pain.  He would not be able to tolerate ABIs we will attempt right lower extremity arterial duplex.  Ultimately may require CTA of the abdomen and pelvis to evaluate his known small aneurysm as well as aortoiliac occlusive disease and evaluate the right lower extremity where previously he was noted to have palpable pulses although the foot.  I do not think arterial insufficiency explains all of his symptoms but may be exacerbating.  This was discussed with the patient he demonstrates good understanding.  He should be on aspirin  and a statin due to underlying vascular disease.  Anaelle Dunton C. Sheree, MD [facility] Office: [phone] Pager: [phone]

## 2024-09-19 NOTE — Progress Notes (Signed)
 Physical Therapy Session Note  Patient Details  Name: David King MRN: [id number] Date of Birth: 1956/10/21  Today's Date: 09/19/2024 PT Individual Time: 0930-1040 + 1445-1525 PT Individual Time Calculation (min): 70 min +  Short Term Goals: Week 1:  PT Short Term Goal 1 (Week 1): Pt will perform w/c transfers with CGA PT Short Term Goal 2 (Week 1): Pt will amb x25' with RW CGA without rest PT Short Term Goal 3 (Week 1): Pt will perform supine<->sit transfers with CGA  Skilled Therapeutic Interventions/Progress Updates:  Session 1: Chart reviewed and pt agreeable to therapy. Pt received seated in WC with 6/10 c/o pain in RLE with activity. Also of note, pt R toe tip noted to have purple discoloration. RN in room and notified MD who agreed to follow up. Session focused on functional transfers, balance, and activity tolerance to promote functional recovery for out of bed mobility and safe home mobility and access. Pt also noted to c/o dizziness with standing t/o session, so due to pt need for MD follow up and pt c/o dizziness, PT session remained in room close to bedside. Pt initiated session with sit to stand using minA + RW. Pt BP assessed in sitting both before and after stand, with readings of 123/32mmHg before stand and 106/41mmHg after stand. Pt then completed transferred to sit EOB using minA + RW. Pt completed series of sit to stand + standing marches to promote activity tolerance. Pt BP assessed after every round. Pt able to complete stand + standing marches using CGA + RW and tolerating 10-45 secs of marching t/o session. BP remained in 120s/70s mmHg t/o activity. Pt also completed multiple rounds of neutral stance using CGA + no UE support for 5-15 secs per round.  Pt required minA for LE management during sit>side.  At end of session, pt was left semi-reclined in bed with all four rails lifted, nurse call bell and all needs in reach.  Session 2: Chart reviewed and pt agreeable to  therapy. Pt received semi-reclined in bed with 6/10 c/o pain in R hip and LE. Also of note, pt requesting toileting at start of session. Session focused on functional transfers and ambulation to promote functional recovery for out of bed mobility and safe home mobility and access. Pt initiated session with amb to toilet using CGA + RW. Pt required minA for LB undressing. Pt required modA for pericare after attempted BM. Pt then completed balance exercises of neutral stance with S + no UE support or rounds of 10-25 secs before needed sitting break, improving from previous session. Pt also completed slow high-knee marching with S + RW.  At end of session, pt was left semi-reclined in bed with floor mat and tray at bedside, nurse call bell and all needs in reach.       Therapy Documentation Precautions:  Precautions Precautions: None, Back Precaution Booklet Issued: Yes (comment) Recall of Precautions/Restrictions: Intact Precaution/Restrictions Comments: watch BP (orthostatic) Required Braces or Orthoses: Spinal Brace Spinal Brace: Thoracolumbosacral orthotic, Applied in sitting position Restrictions Weight Bearing Restrictions Per Provider Order: No General:      Therapy/Group: Individual Therapy  Warrick KANDICE Raspberry 09/19/2024, 12:22 PM

## 2024-09-19 NOTE — Progress Notes (Signed)
 Occupational Therapy Session Note  Patient Details  Name: David King MRN: [id number] Date of Birth: 10/22/1956  Today's Date: 09/19/2024 OT Individual Time: 9199-9154 & 1300-1345 OT Individual Time Calculation (min): 45 min & 45 min    Short Term Goals: Week 1:  OT Short Term Goal 1 (Week 1): Pt will complete LB dressing with Min A OT Short Term Goal 2 (Week 1): Pt will complete toileting with Min A OT Short Term Goal 3 (Week 1): Pt will complete functional transfers with Min A OT Short Term Goal 4 (Week 1): Pt will don/doff TLSO brace with Mod A  Skilled Therapeutic Interventions/Progress Updates:      Therapy Documentation Precautions:  Precautions Precautions: None, Back Precaution Booklet Issued: Yes (comment) Recall of Precautions/Restrictions: Intact Precaution/Restrictions Comments: watch BP (orthostatic) Required Braces or Orthoses: Spinal Brace Spinal Brace: Thoracolumbosacral orthotic, Applied in sitting position Restrictions Weight Bearing Restrictions Per Provider Order: No Session 1 General: Pt supine in bed upon OT arrival, agreeable to OT session.  Pain: 5/10 pain reported in back, activity, intermittent rest breaks, distractions provided for pain management, pt reports tolerable to proceed.   ADL: OT providing skilled intervention on ADL retraining in order to increase independence with tasks and increase activity tolerance. Pt completed the following tasks at the current level of assist: Bed mobility: SBA using bed features, pt excited about this d/t just being ale to complete for first time last night Grooming/oral hygiene: seated in W/C with  Toilet transfer: pt ambulating from bed using RW at Min A, increased difficulty getting over slight incline into bathroom, heavy UE  reliance on RW, pt noting some dizziness upon standing and resolving with ambulating Toileting: able to complete steps of toileting, no void noted even with increased time on commode, pt  able to stabilize with unilateral UE on RW while other UE managing pants without LOB UB dressing: SBA with donning/doffing overhead shirt seated in chair    Pt supine in bed with bed alarm activated, 4 bed rails up for pt preference, BLE elevated for BP management, call light within reach and 4Ps assessed.   Session 2 General:  Pt supine in bed upon OT arrival, agreeable to OT session. Just got labs drawn. Pt session held outdoors for increased mood.  Pain:  7/10 pain reported in back, reported just received medication, activity, intermittent rest breaks, distractions provided for pain management, pt reports tolerable to proceed.   ADL:  Balance: OT providing skilled intervention with ambulation and dynamic balance. Pt completing multiple trials of ambulation with RW, increasing pace with each trial. Pt initially CGA with less UE reliance, although decreasing to Min A with increased UE reliance for gait d/t fatigue  Exercises: Pt completed the following exercise circuit in order to improve functional activity, strength and endurance to prepare for ADLs such as bathing. Pt completed the following exercises in seated position with no noted LOB/SOB and various repetitions on each exercise: - ankle pumps with mental imagery d/t decreased movement on RUE (OT educating on mental imagery) -gentle stretching with RLE hamstring -seated marches   Other Treatments: Pt reporting W/C cushion uncomfortable, OT changing out cushion for increased comfort. Pt reporting increased comfort. Ideally would need ROHO for pressure relied and    Pt seated in W/C at end of session with W/C alarm donned, call light within reach and 4Ps assessed.    Therapy/Group: Individual Therapy  Camie Hoe, OTD, OTR/L 09/19/2024, 3:58 PM

## 2024-09-19 NOTE — Progress Notes (Addendum)
 PROGRESS NOTE   Subjective/Complaints: Continues to have pain in his RLE, overall unchanged from yesterday.    LBM today- a little loose.   ROS: Per HPI Pt denies fever, chills, SOB, abd pain, CP, N/V/C/D, and vision changes.  Denies new motor or sensory changes  Pain uncontrolled (+) R leg   Objective:   No results found.  Recent Labs    09/17/24 0507 09/18/24 1603  WBC 12.3* 11.7*  HGB 13.7 13.0  HCT 40.5 37.7*  PLT 310 290   Recent Labs    09/17/24 0507 09/18/24 1233  NA 133* 128*  K 3.8 4.0  CL 100 95*  CO2 24 20*  GLUCOSE 106* 104*  BUN 20 19  CREATININE 0.98 0.72  CALCIUM  8.9 8.8*    Intake/Output Summary (Last 24 hours) at 09/19/2024 1137 Last data filed at 09/19/2024 9247 Gross per 24 hour  Intake 830 ml  Output 2300 ml  Net -1470 ml     Wound 09/15/24 2032 Pressure Injury Buttocks Bilateral;Right Deep Tissue Pressure Injury - Purple or maroon localized area of discolored intact skin or blood-filled blister due to damage of underlying soft tissue from pressure and/or shear. (Active)     Wound 09/15/24 1848 Pressure Injury Heel Right Deep Tissue Pressure Injury - Purple or maroon localized area of discolored intact skin or blood-filled blister due to damage of underlying soft tissue from pressure and/or shear. (Active)    Physical Exam: Vital Signs Blood pressure (!) 126/56, pulse 84, temperature 98.3 F (36.8 C), temperature source Oral, resp. rate 16, height 5' 9 (1.753 m), weight 83.2 kg, SpO2 96%.    General: awake, alert, NAD, sitting in bed  HENT: conjugate gaze; oropharynx moist CV: regular rate and rhythm; no JVD Pulmonary: CTA B/L; nonlabored breathing GI: soft, NT, ND, (+)BS Psychiatric: Flat, appropriate and cooperative otherwise Extremities: RLE edema, toe discoloration- see image, appears similar to image several days ago TTP R shin region  Neurological:     Comments:  Patient is alert oriented x 4 following commands. Right lower extremity strength is 4/5 proximally and 2/5 distally, LLE 4/5 throughout, decreased sensation in right foot  Prior neuro assessment is c/w today's exam 09/19/2024.  9/17 image   Image 9/21   Assessment/Plan: 1. Functional deficits which require 3+ hours per day of interdisciplinary therapy in a comprehensive inpatient rehab setting. Physiatrist is providing close team supervision and 24 hour management of active medical problems listed below. Physiatrist and rehab team continue to assess barriers to discharge/monitor patient progress toward functional and medical goals  Care Tool:  Bathing    Body parts bathed by patient: Right arm, Left arm, Chest, Abdomen, Front perineal area, Right upper leg, Left upper leg, Face   Body parts bathed by helper: Buttocks, Left lower leg, Right lower leg     Bathing assist Assist Level: Minimal Assistance - Patient > 75%     Upper Body Dressing/Undressing Upper body dressing        Upper body assist Assist Level: Set up assist    Lower Body Dressing/Undressing Lower body dressing            Lower body assist Assist  for lower body dressing: Moderate Assistance - Patient 50 - 74%     Toileting Toileting    Toileting assist Assist for toileting: Moderate Assistance - Patient 50 - 74%     Transfers Chair/bed transfer  Transfers assist  Chair/bed transfer activity did not occur: Safety/medical concerns  Chair/bed transfer assist level: Contact Guard/Touching assist     Locomotion Ambulation   Ambulation assist      Assist level: 2 helpers Assistive device: Walker-rolling Max distance: 46'   Walk 10 feet activity   Assist     Assist level: 2 helpers Assistive device: Walker-rolling   Walk 50 feet activity   Assist Walk 50 feet with 2 turns activity did not occur: Safety/medical concerns (fatigue, unsafe)         Walk 150 feet  activity   Assist Walk 150 feet activity did not occur: Safety/medical concerns (fatigue, unsafe)         Walk 10 feet on uneven surface  activity   Assist Walk 10 feet on uneven surfaces activity did not occur: Safety/medical concerns (fatigue, unsafe)         Wheelchair     Assist Is the patient using a wheelchair?: Yes Type of Wheelchair: Manual    Wheelchair assist level: Supervision/Verbal cueing Max wheelchair distance: 240'    Wheelchair 50 feet with 2 turns activity    Assist        Assist Level: Supervision/Verbal cueing   Wheelchair 150 feet activity     Assist      Assist Level: Supervision/Verbal cueing   Blood pressure (!) 126/56, pulse 84, temperature 98.3 F (36.8 C), temperature source Oral, resp. rate 16, height 5' 9 (1.753 m), weight 83.2 kg, SpO2 96%.  Medical Problem List and Plan: 1. 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  Status post L4-5 transforaminal interbody fusion/posterior lateral fusion insertion interbody device L4-5 decompression 09/08/2024 per Dr. Beuford.  Back brace when out of bed             -patient may shower             -ELOS/Goals: 10-14 days              -Continue CIR PT and OT 2.  Antithrombotics: -DVT/anticoagulation:  Mechanical: Antiembolism stockings, thigh (TED hose) Bilateral lower extremities.  Check vascular study             -antiplatelet therapy: N/A 3. Pain Management: Neurontin  600 mg 3 times daily, Robaxin  and oxycodone  as needed  9/18- Gave a 1x dose of Oxy 15 mg, Increased his Oxy to 10-15 mg q4 hours prn for pain; changed Robaxin  to 1000 mg QID, increased Gabapentin  to 900 mg TID.Also added Lidoderm   for nerve pain on RLE.    -9/19 patient reports continued pain in his leg, improved with getting up.  Does not appear to be getting oxycodone  frequently today.  Continue current regimen and monitor.  Appears more comfortable today  compared to description yesterday  9/20 will change gabapentin  to Lyrica  3 times daily 150 mg for neuropathic pain  9/21  lidocaine  patch R skin, occasional use of oxycodone   4. Anxiety: would benefit from neuropsych eval, magnesium  supplement started.  Ambien  as needed  9/18- Will start Duloxetine  for nerve pain 30 mg at bedtime  9/19 may take several days for duloxetine  to take effect             -antipsychotic agents: N/A 5. Neuropsych/cognition: This  patient is capable of making decisions on his own behalf. 6. Tachycardia: magnesium  supplement started   -9/20-21 HR in the 80s and 90s continue to monitor    09/19/2024    3:15 AM 09/18/2024    9:31 PM 09/18/2024    7:39 PM  Vitals with BMI  Systolic 126  122  Diastolic 56  93  Pulse 84 91 99    7. Fluids/Electrolytes/Nutrition: Routine in and outs with follow-up chemistries.   8.  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  Patient responded to epi drip and Decadron .   9.  Hyperlipidemia.  Pravachol    10.  COPD.  Remote tobacco use.  Continue inhalers as directed.  Check oxygen  saturations every shift   11.  History of prostate cancer.  Status post prostatectomy.  Continue Flomax  0.4 mg daily.   12.  History of detached retina left eye.  Follow-up outpatient ophthalmology   13. Transaminitis  9/18- AST 125 and ALT 161- will stop Pravachol  20 mg daily and decrease Tylenol  to 325 mg q6 hours prn from 650 mg. Last LFTs were normal 7-8 days ago.   14. Azotemia  9/18- BUN up to 26 from 12- will push fluids and recheck in AM.   9/19 BUN decreased to 20, continue encourage oral fluids, recheck tomorrow  9/20 BUN and creatinine improved today, continue current  15. Mild Leukocytosis  9/18- will recheck in AM- has had decadron - no Sx's of UTI/URI.   9/19 WBC a little higher today, may be due to recent Decadron , no signs of infection  9/20 no signs infection noted continue to monitor  16. R heel DTI  9/18-  Prevalon ordered and pt has WOC consult ordered  Patient was seen by wound VAC for deep pressure injury, appreciate assistance and recommendations  9/21 also has some brusing on R toe, appears similar/ a little improve to prior  image. RLE edema VAS US  neg couple days ago . Spoke with vascular, will order ABI and VAS U/S R, appreciate assistance 17. Loose Bms  -hold miralax   -9/21 LBM yesterday, monitor   18. Hyponatremia  -9/21 130 today, add salt tabs  LOS: 4 days A FACE TO FACE EVALUATION WAS PERFORMED  Murray Collier 09/19/2024, 11:37 AM

## 2024-09-20 ENCOUNTER — Observation Stay (HOSPITAL_BASED_OUTPATIENT_CLINIC_OR_DEPARTMENT_OTHER)

## 2024-09-20 ENCOUNTER — Ambulatory Visit (HOSPITAL_COMMUNITY)

## 2024-09-20 DIAGNOSIS — Z95820 Peripheral vascular angioplasty status with implants and grafts: Secondary | ICD-10-CM

## 2024-09-20 DIAGNOSIS — M5416 Radiculopathy, lumbar region: Secondary | ICD-10-CM | POA: Diagnosis not present

## 2024-09-20 DIAGNOSIS — M79604 Pain in right leg: Secondary | ICD-10-CM | POA: Diagnosis not present

## 2024-09-20 MED ORDER — TAMSULOSIN HCL 0.4 MG PO CAPS
0.4000 mg | ORAL_CAPSULE | Freq: Every day | ORAL | Status: DC
  Administered 2024-09-20 – 2024-09-23 (×4): 0.4 mg via ORAL
  Filled 2024-09-20 (×4): qty 1

## 2024-09-20 MED ORDER — CHLORHEXIDINE GLUCONATE CLOTH 2 % EX PADS
6.0000 | MEDICATED_PAD | Freq: Every day | CUTANEOUS | Status: DC
  Administered 2024-09-20: 6 via TOPICAL

## 2024-09-20 MED ORDER — CHLORHEXIDINE GLUCONATE CLOTH 2 % EX PADS
6.0000 | MEDICATED_PAD | Freq: Two times a day (BID) | CUTANEOUS | Status: DC
  Administered 2024-09-20 – 2024-09-21 (×2): 6 via TOPICAL

## 2024-09-20 MED ORDER — ASPIRIN 81 MG PO TBEC
81.0000 mg | DELAYED_RELEASE_TABLET | Freq: Every day | ORAL | Status: DC
  Administered 2024-09-20 – 2024-09-23 (×4): 81 mg via ORAL
  Filled 2024-09-20 (×4): qty 1

## 2024-09-20 MED ORDER — OXYCODONE HCL ER 10 MG PO T12A
10.0000 mg | EXTENDED_RELEASE_TABLET | Freq: Two times a day (BID) | ORAL | Status: DC
  Administered 2024-09-20 – 2024-09-23 (×8): 10 mg via ORAL
  Filled 2024-09-20 (×9): qty 1

## 2024-09-20 NOTE — Progress Notes (Signed)
 Occupational Therapy Session Note  Patient Details  Name: David King MRN: [id number] Date of Birth: 1956-04-13  Today's Date: 09/20/2024 OT Individual Time: 1115-1200 & 1300-1330 OT Individual Time Calculation (min): 45 min & 30 min    Short Term Goals: Week 1:  OT Short Term Goal 1 (Week 1): Pt will complete LB dressing with Min A OT Short Term Goal 2 (Week 1): Pt will complete toileting with Min A OT Short Term Goal 3 (Week 1): Pt will complete functional transfers with Min A OT Short Term Goal 4 (Week 1): Pt will don/doff TLSO brace with Mod A  Skilled Therapeutic Interventions/Progress Updates:      Therapy Documentation Precautions:  Precautions Precautions: None, Back Precaution Booklet Issued: Yes (comment) Recall of Precautions/Restrictions: Intact Precaution/Restrictions Comments: watch BP (orthostatic) Required Braces or Orthoses: Spinal Brace Spinal Brace: Thoracolumbosacral orthotic, Applied in sitting position Restrictions Weight Bearing Restrictions Per Provider Order: No Session 1 General: Pt supine in bed upon OT arrival, agreeable to OT session. Pt saying needing to sit up in order to decrease pain and sit on the edge of the bed. OT noting new RLE redness and cold to touch. Team notified.   Pain: initially 9/10 pain reported in RLE, activity, intermittent rest breaks, distractions provided for pain management, pt reports tolerable to proceed. Pt at end of session reporting 5/10.   Exercises: Pt completed the following exercise circuit in order to improve functional activity, strength and endurance to prepare for ADLs such as bathing. Pt completed the following exercises in seated position with no noted LOB/SOB and 3x10 repetitions on each exercise: -resisted clam shell  -knee flexion with resistance, OT providing AAROM for RLE  Other Treatments: OT swapping out regular W/C leg rests for decreasing edema in RLE, although pt reporting increased hip pain with  elevation, so leg rests lowered.      Pt seated in W/C at end of session with call light within reach and 4Ps assessed.    Session 2 General: Pt seated in W/C upon OT arrival, agreeable to OT session.  Pain: initially upon arrival pt reporting 9/10 pain reported in B hips, activity, intermittent rest breaks, distractions provided for pain management, pt reports tolerable to proceed. Pt reporting decreased pain at end of session.   Other Treatments: OT providing skilled intervention with positioning for pain relief. OT providing pt with kpad for pain management. OT providing pt with 20' W/C and ROHO cushion for skin breakdown prevention. OT educating pt on purpose on ROHO.    Pt supine in bed with bed alarm activated, 2 bed rails up, call light within reach and 4Ps assessed.   Therapy/Group: Individual Therapy  Camie Hoe, OTD, OTR/L 09/20/2024, 12:38 PM

## 2024-09-20 NOTE — Progress Notes (Signed)
 Patient was again evaluated at his bedside.  He is sitting up in his wheelchair.  He continues to have swelling involving the right lower extremity.  He continues to lack any apparent strength to dorsiflexion or plantar flexion at the right ankle.  The etiology of this continues to be unclear, from my perspective, and from the perspective of the neurologist that evaluated him.  I was glad to see that he was able to be evaluated by Dr. Sheree yesterday, as it appears he may have some ideas as to vascular etiologies to at least some portion of his symptoms.  It appears additional investigations regarding this are pending.  For now, he will continue with his therapy, and I do look forward to any additional input Dr. Sheree may be able to provide.

## 2024-09-20 NOTE — Progress Notes (Signed)
 Notified on call Md -Dr. Silver patient refused ABI.   Geni Armor, LPN

## 2024-09-20 NOTE — Progress Notes (Signed)
 Physical Therapy Session Note  Patient Details  Name: David King MRN: [id number] Date of Birth: 1956/06/01  Today's Date: 09/20/2024 PT Individual Time: 0915-1000 PT Individual Time Calculation (min): 45 min  PT Individual Time: 1415-1530 PT Individual Time Calculation (min): 45 min   Short Term Goals: Week 1:  PT Short Term Goal 1 (Week 1): Pt will perform w/c transfers with CGA PT Short Term Goal 2 (Week 1): Pt will amb x25' with RW CGA without rest PT Short Term Goal 3 (Week 1): Pt will perform supine<->sit transfers with CGA  Skilled Therapeutic Interventions/Progress Updates:    Pt in w/c when PT arrived, pt agreeable for session.  Reports 6/10 pain to RLE, Nsg entered for med pass.  Pt wheeled self to day gym with Supervision.   Pt performed standing weight shifting/balance ex's using RW for support, right knee support needed from PT.  Pt limited with standing tolerance ~x30 before resting in w/c.  Sit<->stand transfers are Supervision initially, Min/Mod A when fatigues.  Pt performed sit to standing ex's x8 reps total.  Seated RLE LAQs, HS curls with red TB x12 reps. Pt c/o intermittent right hip pain with RLE LAQs.  Gait with RW CGA x30' step to pattern, cueing for right hip flexor facilitation to clear foot. Pt requesting to return to room. Pt wheeled self back to room, up in w/c with all items within reach.   Second session: Pt in bed when PT arrived, moaning in pain; pt agreeable for session. Pt states he is scheduled for CT scan for RLE and concerned he would miss it.  Notified Nsg of rehab session.  Reports RLE 8/10, just had pain medication.  Pt transferred bed->EOB with using bed CGA railing, log roll technique, donned brace with set up. Pt wheeled self to main gym for // bar activity.  Sit<->stand from w/c with Supervision, standing with UE support on // bar: wt shifting-emphasis on RLE weight acceptance side to side, front to back.  Standing on non compliant surface:  heel  raises 2x10, marching 2x10, mini squats 3x10; staggered standing LLE forward: mini squats x10; rest break provided as needed.  Gait with EVA with w/c follow: x25' with x2 rest breaks. PT wheeled back to room, transferred to bed with Supervision using RW.  Pt seated at EOB resting UE on RW, wife present in room.     Therapy Documentation Precautions:  Precautions Precautions: None, Back Precaution Booklet Issued: Yes (comment) Recall of Precautions/Restrictions: Intact Precaution/Restrictions Comments: watch BP (orthostatic) Required Braces or Orthoses: Spinal Brace Spinal Brace: Thoracolumbosacral orthotic, Applied in sitting position Restrictions Weight Bearing Restrictions Per Provider Order: No    Pain: 6/10 RLE     Therapy/Group: Individual Therapy  [name] Fast 09/20/2024, 12:44 PM

## 2024-09-20 NOTE — Progress Notes (Signed)
 Patient has refused breathing treatments six scheduled times. RT discontinued. RT will continue to monitor as needed.

## 2024-09-20 NOTE — Progress Notes (Signed)
 PROGRESS NOTE   Subjective/Complaints: Pt reports cannot tolerate the in/out caths- 2-3/xday.  Not voiding a lot- small amounts and pushing a lot to go- willing to place foley back since in/out caths very painful.   Said Lyrica  and lidocaine  patches and oxycodone  increase (I increased Thursday)  had no significant improvement. Not taking as much as could per chart documentation.    ROS: Per HPI   Pt denies SOB, abd pain, CP, N/V/C/D, and vision changes  (+) RLE uncontrolled pain  Objective:   No results found.  Recent Labs    09/18/24 1603 09/19/24 1312  WBC 11.7* 11.1*  HGB 13.0 12.5*  HCT 37.7* 37.0*  PLT 290 262   Recent Labs    09/18/24 1233 09/19/24 1312  NA 128* 130*  K 4.0 4.0  CL 95* 95*  CO2 20* 25  GLUCOSE 104* 139*  BUN 19 18  CREATININE 0.72 0.87  CALCIUM  8.8* 9.0    Intake/Output Summary (Last 24 hours) at 09/20/2024 0818 Last data filed at 09/20/2024 0800 Gross per 24 hour  Intake 240 ml  Output 2675 ml  Net -2435 ml     Wound 09/15/24 2032 Pressure Injury Buttocks Bilateral;Right Deep Tissue Pressure Injury - Purple or maroon localized area of discolored intact skin or blood-filled blister due to damage of underlying soft tissue from pressure and/or shear. (Active)     Wound 09/15/24 1848 Pressure Injury Heel Right Deep Tissue Pressure Injury - Purple or maroon localized area of discolored intact skin or blood-filled blister due to damage of underlying soft tissue from pressure and/or shear. (Active)    Physical Exam: Vital Signs Blood pressure (!) 128/91, pulse 83, temperature 98.6 F (37 C), temperature source Oral, resp. rate 16, height 5' 9 (1.753 m), weight 83.2 kg, SpO2 95%.     General: awake, alert, appropriate, sitting on toilet trying to void; nursing in room; NAD HENT: conjugate gaze; oropharynx moist CV: regular rate and rhythm; no JVD Pulmonary: CTA B/L; no W/R/R-  good air movement GI: soft, NT, ND, (+)BS Psychiatric: appropriate- irritable about caths. Neurological: Ox3  TTP R shin and R foot region  Neurological:     Comments: Patient is alert oriented x 4 following commands. Right lower extremity strength is 4/5 proximally and 2/5 distally, LLE 4/5 throughout, decreased sensation in right foot  Prior neuro assessment is c/w today's exam 09/20/2024.  9/17 image   Image 9/21   Assessment/Plan: 1. Functional deficits which require 3+ hours per day of interdisciplinary therapy in a comprehensive inpatient rehab setting. Physiatrist is providing close team supervision and 24 hour management of active medical problems listed below. Physiatrist and rehab team continue to assess barriers to discharge/monitor patient progress toward functional and medical goals  Care Tool:  Bathing    Body parts bathed by patient: Right arm, Left arm, Chest, Abdomen, Front perineal area, Right upper leg, Left upper leg, Face   Body parts bathed by helper: Buttocks, Left lower leg, Right lower leg     Bathing assist Assist Level: Minimal Assistance - Patient > 75%     Upper Body Dressing/Undressing Upper body dressing  Upper body assist Assist Level: Set up assist    Lower Body Dressing/Undressing Lower body dressing            Lower body assist Assist for lower body dressing: Moderate Assistance - Patient 50 - 74%     Toileting Toileting    Toileting assist Assist for toileting: Moderate Assistance - Patient 50 - 74%     Transfers Chair/bed transfer  Transfers assist  Chair/bed transfer activity did not occur: Safety/medical concerns  Chair/bed transfer assist level: Contact Guard/Touching assist     Locomotion Ambulation   Ambulation assist      Assist level: 2 helpers Assistive device: Walker-rolling Max distance: 46'   Walk 10 feet activity   Assist     Assist level: 2 helpers Assistive device:  Walker-rolling   Walk 50 feet activity   Assist Walk 50 feet with 2 turns activity did not occur: Safety/medical concerns (fatigue, unsafe)         Walk 150 feet activity   Assist Walk 150 feet activity did not occur: Safety/medical concerns (fatigue, unsafe)         Walk 10 feet on uneven surface  activity   Assist Walk 10 feet on uneven surfaces activity did not occur: Safety/medical concerns (fatigue, unsafe)         Wheelchair     Assist Is the patient using a wheelchair?: Yes Type of Wheelchair: Manual    Wheelchair assist level: Supervision/Verbal cueing Max wheelchair distance: 240'    Wheelchair 50 feet with 2 turns activity    Assist        Assist Level: Supervision/Verbal cueing   Wheelchair 150 feet activity     Assist      Assist Level: Supervision/Verbal cueing   Blood pressure (!) 128/91, pulse 83, temperature 98.6 F (37 C), temperature source Oral, resp. rate 16, height 5' 9 (1.753 m), weight 83.2 kg, SpO2 95%.  Medical Problem List and Plan: 1. 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  Status post L4-5 transforaminal interbody fusion/posterior lateral fusion insertion interbody device L4-5 decompression 09/08/2024 per Dr. Beuford.  Back brace when out of bed             -patient may shower             -ELOS/Goals: 10-14 days              Con't CIR PT and OT 2.  Antithrombotics: -DVT/anticoagulation:  Mechanical: Antiembolism stockings, thigh (TED hose) Bilateral lower extremities.  Check vascular study             -antiplatelet therapy: N/A 3. Pain Management: Neurontin  600 mg 3 times daily, Robaxin  and oxycodone  as needed  9/18- Gave a 1x dose of Oxy 15 mg, Increased his Oxy to 10-15 mg q4 hours prn for pain; changed Robaxin  to 1000 mg QID, increased Gabapentin  to 900 mg TID.Also added Lidoderm   for nerve pain on RLE.    -9/19 patient reports continued pain in his  leg, improved with getting up.  Does not appear to be getting oxycodone  frequently today.  Continue current regimen and monitor.  Appears more comfortable today compared to description yesterday  9/20 will change gabapentin  to Lyrica  3 times daily 150 mg for neuropathic pain  9/21  lidocaine  patch R skin, occasional use of oxycodone    9/22- will start Oxycontin  10 mg BID for the next 1 week to see if that will help pain.  4. Anxiety:  would benefit from neuropsych eval, magnesium  supplement started.  Ambien  as needed  9/18- Will start Duloxetine  for nerve pain 30 mg at bedtime  9/19 may take several days for duloxetine  to take effect             -antipsychotic agents: N/A 5. Neuropsych/cognition: This patient is capable of making decisions on his own behalf. 6. Tachycardia: magnesium  supplement started   -9/20-21 HR in the 80s and 90s continue to monitor    09/20/2024    4:13 AM 09/19/2024    7:33 PM 09/19/2024    4:51 PM  Vitals with BMI  Systolic 128 119 861  Diastolic 91 79 77  Pulse 83 75 87    7. Fluids/Electrolytes/Nutrition: Routine in and outs with follow-up chemistries.   8.  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  Patient responded to epi drip and Decadron .   9.  Hyperlipidemia.  Pravachol    10.  COPD.  Remote tobacco use.  Continue inhalers as directed.  Check oxygen  saturations every shift   11.  History of prostate cancer.  Status post prostatectomy.  Continue Flomax  0.4 mg daily.   12.  History of detached retina left eye.  Follow-up outpatient ophthalmology   13. Transaminitis  9/18- AST 125 and ALT 161- will stop Pravachol  20 mg daily and decrease Tylenol  to 325 mg q6 hours prn from 650 mg. Last LFTs were normal 7-8 days ago.   14. Azotemia  9/18- BUN up to 26 from 12- will push fluids and recheck in AM.   9/19 BUN decreased to 20, continue encourage oral fluids, recheck tomorrow  9/20 BUN and creatinine improved today, continue  current  9/22- Cr 0.87 and BUN 18- doing better 15. Mild Leukocytosis  9/18- will recheck in AM- has had decadron - no Sx's of UTI/URI.   9/19 WBC a little higher today, may be due to recent Decadron , no signs of infection  9/20 no signs infection noted continue to monitor  9/22- No signs of infection- WBC stable  16. R heel DTI  9/18- Prevalon ordered and pt has WOC consult ordered  Patient was seen by wound VAC for deep pressure injury, appreciate assistance and recommendations  9/21 also has some brusing on R toe, appears similar/ a little improve to prior  image. RLE edema VAS US  neg couple days ago . Spoke with vascular, will order ABI and VAS U/S R, appreciate assistance  9/22- Arterial duplex ordered for RLE this weekend- is pending- ordered ASA 81 mg per vascular- cannot order Statin because pt's LFTs significantly elevated- will try to restart if LFT's have improved this week 17. Loose Bms  -hold miralax   -9/21 LBM yesterday, monitor   18. Hyponatremia  -9/21 130 today, add salt tabs  9/22- Na 132 19. Urinary retention  9/22- started Flomax  last week, but changed to q supper- also will replace Foley- he's having a lot of pain with In/out caths and asked for foley to be replaced.    I spent a total of 51   minutes on total care today- >50% coordination of care- due to  D/w pt at length about catheter- nursing about foley and pain; and made multiple changes to meds- also reviewed notes from weekend and testing done- also reviewed labs.    LOS: 5 days A FACE TO FACE EVALUATION WAS PERFORMED  Kailin Principato 09/20/2024, 8:18 AM

## 2024-09-20 NOTE — Progress Notes (Signed)
  Progress Note    09/20/2024 1:24 PM * No surgery found *  Subjective: Still having via right lower extremity pain  Vitals:   09/19/24 1933 09/20/24 0413  BP: 119/79 (!) 128/91  Pulse: 75 83  Resp: 16 16  Temp: 98.6 F (37 C) 98.6 F (37 C)  SpO2: 96% 95%    Physical Exam: Awake alert and oriented Sitting in wheelchair Patient refuses evaluation of right leg secondary to pain  CBC    Component Value Date/Time   WBC 11.1 (H) 09/19/2024 1312   RBC 3.83 (L) 09/19/2024 1312   HGB 12.5 (L) 09/19/2024 1312   HCT 37.0 (L) 09/19/2024 1312   PLT 262 09/19/2024 1312   MCV 96.6 09/19/2024 1312   MCH 32.6 09/19/2024 1312   MCHC 33.8 09/19/2024 1312   RDW 13.2 09/19/2024 1312   LYMPHSABS 1.9 09/17/2024 0507   MONOABS 0.9 09/17/2024 0507   EOSABS 0.1 09/17/2024 0507   BASOSABS 0.1 09/17/2024 0507    BMET    Component Value Date/Time   NA 130 (L) 09/19/2024 1312   K 4.0 09/19/2024 1312   CL 95 (L) 09/19/2024 1312   CO2 25 09/19/2024 1312   GLUCOSE 139 (H) 09/19/2024 1312   BUN 18 09/19/2024 1312   CREATININE 0.87 09/19/2024 1312   CREATININE 1.06 10/10/2020 0915   CALCIUM  9.0 09/19/2024 1312   GFRNONAA >60 09/19/2024 1312   GFRNONAA 73 10/10/2020 0915   GFRAA 84 10/10/2020 0915    INR    Component Value Date/Time   INR 1.2 08/22/2022 0833     Intake/Output Summary (Last 24 hours) at 09/20/2024 1324 Last data filed at 09/20/2024 1049 Gross per 24 hour  Intake 240 ml  Output 2600 ml  Net -2360 ml     Assessment/plan:  68 y.o. male in rehab with severe pain right lower extremity does have a history of peripheral vascular disease with stenting of his bilateral common iliac arteries.  He was hypotensive during his procedure which gives rise to some concern for occlusion of his stents.  I do not think he has pure vascular etiology of pain but certainly may be 1 component.  I discussed the case with his primary surgeon Dr. Beuford he agrees with proceeding with  vascular workup.  CTA runoff ordered.    Myalee Stengel C. Sheree, MD [facility] Office: [phone] Pager: [phone]  09/20/2024 1:24 PM

## 2024-09-21 ENCOUNTER — Encounter (HOSPITAL_COMMUNITY)
Admission: AD | Disposition: A | Discharge status: Other Acute Inpatient Hospital | Payer: Self-pay | Source: Intra-hospital | Attending: Physical Medicine and Rehabilitation

## 2024-09-21 DIAGNOSIS — I743 Embolism and thrombosis of arteries of the lower extremities: Secondary | ICD-10-CM

## 2024-09-21 DIAGNOSIS — M5416 Radiculopathy, lumbar region: Secondary | ICD-10-CM | POA: Diagnosis not present

## 2024-09-21 DIAGNOSIS — I7409 Other arterial embolism and thrombosis of abdominal aorta: Secondary | ICD-10-CM

## 2024-09-21 HISTORY — PX: ABDOMINAL AORTOGRAM W/LOWER EXTREMITY: CATH118223

## 2024-09-21 LAB — POCT ACTIVATED CLOTTING TIME: Activated Clotting Time: 164 s

## 2024-09-21 LAB — CK: Total CK: 3886 U/L — ABNORMAL HIGH (ref 49–397)

## 2024-09-21 SURGERY — ABDOMINAL AORTOGRAM W/LOWER EXTREMITY
Anesthesia: LOCAL

## 2024-09-21 MED ORDER — SODIUM CHLORIDE 0.9% FLUSH: 3.0000 mL | INTRAVENOUS | Status: DC | PRN

## 2024-09-21 MED ORDER — HEPARIN SODIUM (PORCINE) 1000 UNIT/ML IJ SOLN
INTRAMUSCULAR | Status: AC
  Filled 2024-09-21: qty 10

## 2024-09-21 MED ORDER — PRAVASTATIN SODIUM 10 MG PO TABS
10.0000 mg | ORAL_TABLET | Freq: Every day | ORAL | Status: DC
  Administered 2024-09-21: 10 mg via ORAL
  Filled 2024-09-21 (×2): qty 1

## 2024-09-21 MED ORDER — MIDAZOLAM HCL 2 MG/2ML IJ SOLN
INTRAMUSCULAR | Status: AC
  Filled 2024-09-21: qty 2

## 2024-09-21 MED ORDER — FENTANYL CITRATE (PF) 100 MCG/2ML IJ SOLN
INTRAMUSCULAR | Status: DC | PRN
  Administered 2024-09-21: 50 ug via INTRAVENOUS

## 2024-09-21 MED ORDER — HEPARIN (PORCINE) IN NACL 1000-0.9 UT/500ML-% IV SOLN
INTRAVENOUS | Status: DC | PRN
  Administered 2024-09-21 (×2): 500 mL

## 2024-09-21 MED ORDER — SODIUM CHLORIDE 0.9 % WEIGHT BASED INFUSION
1.0000 mL/kg/h | INTRAVENOUS | Status: AC
  Administered 2024-09-21: 1 mL/kg/h via INTRAVENOUS

## 2024-09-21 MED ORDER — SORBITOL 70 % SOLN: 30.0000 mL | Freq: Once | Status: DC

## 2024-09-21 MED ORDER — IODIXANOL 320 MG/ML IV SOLN
INTRAVENOUS | Status: DC | PRN
  Administered 2024-09-21: 100 mL

## 2024-09-21 MED ORDER — FENTANYL CITRATE (PF) 100 MCG/2ML IJ SOLN
INTRAMUSCULAR | Status: AC
  Filled 2024-09-21: qty 2

## 2024-09-21 MED ORDER — NITROGLYCERIN 1 MG/10 ML FOR IR/CATH LAB
INTRA_ARTERIAL | Status: DC | PRN
  Administered 2024-09-21: 200 ug via INTRA_ARTERIAL

## 2024-09-21 MED ORDER — MIDAZOLAM HCL 2 MG/2ML IJ SOLN
INTRAMUSCULAR | Status: DC | PRN
  Administered 2024-09-21: 2 mg via INTRAVENOUS

## 2024-09-21 MED ORDER — NITROGLYCERIN 1 MG/10 ML FOR IR/CATH LAB
INTRA_ARTERIAL | Status: AC
  Filled 2024-09-21: qty 10

## 2024-09-21 MED ORDER — LIDOCAINE HCL (PF) 1 % IJ SOLN
INTRAMUSCULAR | Status: AC
Start: 2024-09-21 — End: 2024-09-21
  Filled 2024-09-21: qty 30

## 2024-09-21 MED ORDER — SODIUM CHLORIDE 0.9% FLUSH
3.0000 mL | Freq: Two times a day (BID) | INTRAVENOUS | Status: DC
  Administered 2024-09-21 – 2024-09-23 (×3): 3 mL via INTRAVENOUS

## 2024-09-21 MED ORDER — HEPARIN SODIUM (PORCINE) 1000 UNIT/ML IJ SOLN
INTRAMUSCULAR | Status: DC | PRN
  Administered 2024-09-21: 2000 [IU] via INTRAVENOUS

## 2024-09-21 MED ORDER — SODIUM CHLORIDE 0.9 % IV SOLN: 250.0000 mL | INTRAVENOUS | Status: AC | PRN

## 2024-09-21 MED ORDER — LIDOCAINE HCL (PF) 1 % IJ SOLN
INTRAMUSCULAR | Status: DC | PRN
  Administered 2024-09-21: 15 mL

## 2024-09-21 MED ORDER — CHLORHEXIDINE GLUCONATE CLOTH 2 % EX PADS
6.0000 | MEDICATED_PAD | Freq: Two times a day (BID) | CUTANEOUS | Status: DC
  Administered 2024-09-21 – 2024-09-24 (×6): 6 via TOPICAL

## 2024-09-21 MED ORDER — HYDRALAZINE HCL 20 MG/ML IJ SOLN: 5.0000 mg | INTRAMUSCULAR | Status: DC | PRN

## 2024-09-21 SURGICAL SUPPLY — 13 items
BAG SNAP BAND KOVER 36X36 (MISCELLANEOUS) IMPLANT
CATH ANGIO 5F BER2 65CM (CATHETERS) IMPLANT
CATH ANGIO 5F PIGTAIL 100CM (CATHETERS) IMPLANT
CATH OMNI FLUSH 5F 65CM (CATHETERS) IMPLANT
GLIDEWIRE ADV .035X260CM (WIRE) IMPLANT
KIT MICROPUNCTURE NIT STIFF (SHEATH) IMPLANT
KIT SINGLE USE MANIFOLD (KITS) IMPLANT
SET ATX-X65L (MISCELLANEOUS) IMPLANT
SET MICROPUNCTURE 5F STIFF (MISCELLANEOUS) IMPLANT
SHEATH PINNACLE 5F 10CM (SHEATH) IMPLANT
SHEATH PROBE COVER 6X72 (BAG) IMPLANT
TRAY PV CATH (CUSTOM PROCEDURE TRAY) ×2 IMPLANT
WIRE BENTSON .035X145CM (WIRE) IMPLANT

## 2024-09-21 NOTE — Progress Notes (Signed)
  Progress Note    09/21/2024 7:47 AM * No surgery found *  Subjective:  persistent right leg pain  Vitals:   09/20/24 2059 09/21/24 0500  BP: 120/72 135/81  Pulse: 87 81  Resp: 17 17  Temp: 98.3 F (36.8 C) 98.5 F (36.9 C)  SpO2: 98% 98%    Physical Exam: Aaox3 Non labored respirations no right lower extremity leg evaluation performed secondary to patient pain  CBC    Component Value Date/Time   WBC 11.1 (H) 09/19/2024 1312   RBC 3.83 (L) 09/19/2024 1312   HGB 12.5 (L) 09/19/2024 1312   HCT 37.0 (L) 09/19/2024 1312   PLT 262 09/19/2024 1312   MCV 96.6 09/19/2024 1312   MCH 32.6 09/19/2024 1312   MCHC 33.8 09/19/2024 1312   RDW 13.2 09/19/2024 1312   LYMPHSABS 1.9 09/17/2024 0507   MONOABS 0.9 09/17/2024 0507   EOSABS 0.1 09/17/2024 0507   BASOSABS 0.1 09/17/2024 0507    BMET    Component Value Date/Time   NA 130 (L) 09/19/2024 1312   K 4.0 09/19/2024 1312   CL 95 (L) 09/19/2024 1312   CO2 25 09/19/2024 1312   GLUCOSE 139 (H) 09/19/2024 1312   BUN 18 09/19/2024 1312   CREATININE 0.87 09/19/2024 1312   CREATININE 1.06 10/10/2020 0915   CALCIUM  9.0 09/19/2024 1312   GFRNONAA >60 09/19/2024 1312   GFRNONAA 73 10/10/2020 0915   GFRAA 84 10/10/2020 0915    INR    Component Value Date/Time   INR 1.2 08/22/2022 0833     Intake/Output Summary (Last 24 hours) at 09/21/2024 0747 Last data filed at 09/21/2024 0539 Gross per 24 hour  Intake 903 ml  Output 3725 ml  Net -2822 ml     Assessment:  68 y.o. male is status post TLIF with pedicle screw fixation L4/5, duplex demonstrates monophasic flow below the common iliacs on the right where he is stented and he has an occluded SFA and popliteal segment with absent flow in his tibial arteries.  Plan: Plan for angiogram today from left common femoral approach with Dr. Serene in the Cath Lab.  He will be n.p.o. after breakfast.  We discussed that this is a limb threatening situation he demonstrated good  understanding.  We also discussed that he will possibly require surgical intervention.  CK was ordered.   Ethyn Schetter C. Sheree, MD [facility] [place] Office: [phone] Pager: [phone]  09/21/2024 7:47 AM

## 2024-09-21 NOTE — Op Note (Signed)
 Patient name: David King MRN: [id number] DOB: 03/09/1956 Sex: male  09/21/2024 Pre-operative Diagnosis: Right leg ischemia Post-operative diagnosis:  Same Surgeon:  [name] Procedure Performed:  1.  Ultrasound-guided access, left common femoral artery  2.  Selective injection with catheter in the common iliac artery  3.  Ultrasound-guided access, left brachial artery  4.  Catheter placement into abdominal aorta  5.  Abdominal aortogram  6.  Bilateral leg angiogram  7.  Conscious sedation, 15 minutes   Indications: This is a 68 year old gentleman with history of vascular surgery with endarterectomy and iliac stenting and back surgery from a anterior and posterior approach.  He has been complaining of right leg pain.  He is here for further evaluation  Procedure:  The patient was identified in the holding area and taken to room 8.  The patient was then placed supine on the table and prepped and draped in the usual sterile fashion.  A time out was called.  Conscious sedation was administered with the use of IV fentanyl  and Versed  under continuous physician and nurse monitoring.  Heart rate, blood pressure, and oxygen  saturation were continuously monitored.  Total sedation time was 50 minutes.  Ultrasound was used to evaluate the left common femoral artery.  It was patent, however there did appear to be thrombus within the common femoral artery.  A digital ultrasound image was acquired.  A micropuncture needle was used to access the left common femoral artery under ultrasound guidance.  An 018 wire was advanced without resistance and a micropuncture sheath was placed.  I then inserted a Bentson wire but met resistance.  I removed the micropuncture sheath and used a Berenstein 2 catheter.  I was confident the wire was in the iliac stents on the left but was concerned that I may be in a dissection within the aorta.  I shot a contrast injection that showed that there was opacification of his  stents.  I then shot an aortogram to look like I was in a dissection.  Because the patient did not have palpable femoral pulses I elected to stop.  I held pressure for 10 minutes.  Attention was turned towards the left arm.  The left brachial artery was evaluated with ultrasound which was widely patent.  1% lidocaine  was used local anesthesia.  The left brachial artery was then cannulated under ultrasound guidance with a micropuncture needle.  An 018 wire was inserted followed by placement of micropuncture sheath.  I then placed a 5 French sheath over a Bentson wire.  Heparin  and nitroglycerin  were given through the sheath.  I then advanced a pigtail catheter into the abdominal aorta and aortogram with bilateral runoff was performed.     Aortogram: The suprarenal abdominal aorta is patent.  The visualized portions of the superior mesenteric artery and bilateral renal arteries are widely patent.  There is occlusion of the infrarenal abdominal aorta at the level of the renal arteries.  There does appear to be reconstitution of the common femoral arteries bilaterally.   Right Lower Extremity: The right common femoral profundofemoral artery appeared to be patent.  The superficial femoral artery is patent down to the adductor canal where it occludes.  Distal evaluation was not possible given proximal obstruction  Left Lower Extremity: The left common femoral profundofemoral artery appeared to be patent.  The superficial femoral artery is patent down to the adductor canal where it occludes.  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 [name], M.D., [facility] [place] Office: [phone] Pager:  [phone]

## 2024-09-21 NOTE — Plan of Care (Signed)
  Problem: Consults Goal: RH SPINAL CORD INJURY PATIENT EDUCATION Description:  See Patient Education module for education specifics.  Outcome: Progressing   Problem: SCI BOWEL ELIMINATION Goal: RH STG MANAGE BOWEL WITH ASSISTANCE Description: STG Manage Bowel with supervision Assistance. Outcome: Progressing   Problem: SCI BLADDER ELIMINATION Goal: RH STG MANAGE BLADDER WITH ASSISTANCE Description: STG Manage Bladder With supervision  Assistance Outcome: Progressing   Problem: RH SKIN INTEGRITY Goal: RH STG SKIN FREE OF INFECTION/BREAKDOWN Description: Manage skin free of infection/breakdown with supervision assistance Outcome: Progressing   Problem: RH SAFETY Goal: RH STG ADHERE TO SAFETY PRECAUTIONS W/ASSISTANCE/DEVICE Description: STG Adhere to Safety Precautions With supervision Assistance/Device. Outcome: Progressing   Problem: RH PAIN MANAGEMENT Goal: RH STG PAIN MANAGED AT OR BELOW PT'S PAIN GOAL Description: <4 w/ prns Outcome: Progressing

## 2024-09-21 NOTE — Progress Notes (Signed)
 PROGRESS NOTE   Subjective/Complaints: Pt reports cannot tolerate anyone examining his RLE.   Pain a little better with Oxycontin -  Tiny BM last 2 days- but feeling constipated- feels like needs to go more.   Pt reports before surgery on his back pain was 5-6/10 on average- but could spike- pain mainly RLE>LLE- not much in back- has the same quality of being on fire, but much worse since back surgery.     ROS: Per HPI   Pt denies SOB, abd pain, CP, N/V/ (+)C/D, and vision changes   (+) RLE uncontrolled pain- slightly improved with Oxycontin   Objective:   VAS US  LOWER EXTREMITY ARTERIAL DUPLEX Result Date: 09/20/2024 LOWER EXTREMITY ARTERIAL DUPLEX STUDY Patient Name:  ZACCAI CHAVARIN  Date of Exam:   09/20/2024 Medical Rec #: [id number]      Accession #:    [id number] Date of Birth: May 29, 1956      Patient Gender: M Patient Age:   68 years Exam Location:  [facility] Procedure:      VAS US  LOWER EXTREMITY ARTERIAL DUPLEX Referring Phys: MURRAY COLLIER --------------------------------------------------------------------------------  Indications: Rest pain, and peripheral artery disease. High Risk Factors: Hypertension, past history of smoking, coronary artery                    disease. Other Factors: PAD.  Vascular Interventions: Hx of bilateral CIA stenting, left EIA and CFA stenting                         at [facility] 02/26/2022. Current ABI:            unable to obtain Comparison Study: Previous arterial duplex on 07/05/2024 at [facility] was limited,                   however CFA, SFA, and PFA showed triphasic waveforms. RLE ABI                   on 05/05/2024 was 1.08 Performing Technologist: Ezzie Potters RVT, RDMS  Examination Guidelines: A complete evaluation includes B-mode imaging, spectral Doppler, color Doppler, and power Doppler as needed of all accessible portions of each vessel. Bilateral testing is considered an integral part of  a complete examination. Limited examinations for reoccurring indications may be performed as noted.  +-----------+--------+-----+--------+-------------------+--------+ RIGHT      PSV cm/sRatioStenosisWaveform           Comments +-----------+--------+-----+--------+-------------------+--------+ CIA Prox   49                   dampened monophasic         +-----------+--------+-----+--------+-------------------+--------+ EIA Mid    19                   dampened monophasic         +-----------+--------+-----+--------+-------------------+--------+ CFA Mid    29                   dampened monophasic         +-----------+--------+-----+--------+-------------------+--------+ DFA        44                                               +-----------+--------+-----+--------+-------------------+--------+  SFA Prox   17                   dampened monophasic         +-----------+--------+-----+--------+-------------------+--------+ SFA Mid    16                   dampened monophasic         +-----------+--------+-----+--------+-------------------+--------+ SFA Distal              occluded                            +-----------+--------+-----+--------+-------------------+--------+ POP Prox                occluded                            +-----------+--------+-----+--------+-------------------+--------+ POP Mid                 occluded                            +-----------+--------+-----+--------+-------------------+--------+ POP Distal 10                   dampened monophasic         +-----------+--------+-----+--------+-------------------+--------+ TP Trunk   6                    dampened monophasic         +-----------+--------+-----+--------+-------------------+--------+ ATA Prox   6                    dampened monophasic         +-----------+--------+-----+--------+-------------------+--------+ ATA Mid    4                     dampened monophasic         +-----------+--------+-----+--------+-------------------+--------+ ATA Distal              occluded                            +-----------+--------+-----+--------+-------------------+--------+ PTA Prox   4                    dampened monophasic         +-----------+--------+-----+--------+-------------------+--------+ PTA Mid                 occluded                            +-----------+--------+-----+--------+-------------------+--------+ PTA Distal              occluded                            +-----------+--------+-----+--------+-------------------+--------+ PERO Prox               occluded                            +-----------+--------+-----+--------+-------------------+--------+ PERO Mid                occluded                            +-----------+--------+-----+--------+-------------------+--------+  PERO Distal             occluded                            +-----------+--------+-----+--------+-------------------+--------+ DP                      occluded                            +-----------+--------+-----+--------+-------------------+--------+  Summary: Right: Dampened monophasic waveforms of common iliac artery suggestive of more proximal disease. Unable to visualize aorta due to patient movement and discomfort. Total occlusion noted in the distal superficial femoral artery. Total occlusion noted in the proximal and mid popliteal artery (reconstitutes in distal popliteal). Total occlusion noted in the distal anterior tibial artery. Total occlusion noted in the peroneal artery. Total occlusion noted in the dorsal pedis artery. Arterial wall calcifications seen throughout lower extremity.  See table(s) above for measurements and observations.    Preliminary     Recent Labs    09/18/24 1603 09/19/24 1312  WBC 11.7* 11.1*  HGB 13.0 12.5*  HCT 37.7* 37.0*  PLT 290 262   Recent Labs    09/18/24 1233  09/19/24 1312  NA 128* 130*  K 4.0 4.0  CL 95* 95*  CO2 20* 25  GLUCOSE 104* 139*  BUN 19 18  CREATININE 0.72 0.87  CALCIUM  8.8* 9.0    Intake/Output Summary (Last 24 hours) at 09/21/2024 0839 Last data filed at 09/21/2024 0800 Gross per 24 hour  Intake 903 ml  Output 3725 ml  Net -2822 ml     Wound 09/15/24 2032 Pressure Injury Buttocks Bilateral;Right Deep Tissue Pressure Injury - Purple or maroon localized area of discolored intact skin or blood-filled blister due to damage of underlying soft tissue from pressure and/or shear. (Active)     Wound 09/15/24 1848 Pressure Injury Heel Right Deep Tissue Pressure Injury - Purple or maroon localized area of discolored intact skin or blood-filled blister due to damage of underlying soft tissue from pressure and/or shear. (Active)    Physical Exam: Vital Signs Blood pressure 135/81, pulse 81, temperature 98.5 F (36.9 C), temperature source Oral, resp. rate 17, height 5' 9 (1.753 m), weight 83.2 kg, SpO2 98%.      General: awake, alert, appropriate, sitting up in bed; c/o worse pain when leg in bed- better with leg dangling; NAD HENT: conjugate gaze; oropharynx moist CV: regular rate and rhythm; no JVD Pulmonary: CTA B/L; no W/R/R- good air movement GI: soft, NT, ND, (+) hypoactive BS Psychiatric: appropriate- just frustrated about pain  MSK- unable to let me examine RLE due to pain TTP R shin and R foot region  Neurological:     Comments: Patient is alert oriented x 4 following commands. Right lower extremity strength is 4/5 proximally and 2/5 distally, LLE 4/5 throughout, decreased sensation in right foot  Prior neuro assessment is c/w today's exam 09/21/2024.  9/17 image   Image 9/21   Assessment/Plan: 1. Functional deficits which require 3+ hours per day of interdisciplinary therapy in a comprehensive inpatient rehab setting. Physiatrist is providing close team supervision and 24 hour management of active medical  problems listed below. Physiatrist and rehab team continue to assess barriers to discharge/monitor patient progress toward functional and medical goals  Care Tool:  Bathing    Body parts bathed by patient: Right arm, Left  arm, Chest, Abdomen, Front perineal area, Right upper leg, Left upper leg, Face   Body parts bathed by helper: Buttocks, Left lower leg, Right lower leg     Bathing assist Assist Level: Minimal Assistance - Patient > 75%     Upper Body Dressing/Undressing Upper body dressing        Upper body assist Assist Level: Set up assist    Lower Body Dressing/Undressing Lower body dressing            Lower body assist Assist for lower body dressing: Moderate Assistance - Patient 50 - 74%     Toileting Toileting Toileting Activity did not occur Press photographer and hygiene only): Refused  Toileting assist Assist for toileting: Moderate Assistance - Patient 50 - 74%     Transfers Chair/bed transfer  Transfers assist  Chair/bed transfer activity did not occur: Safety/medical concerns  Chair/bed transfer assist level: Supervision/Verbal cueing     Locomotion Ambulation   Ambulation assist      Assist level: 2 helpers Assistive device: Gillie Max distance: 25'   Walk 10 feet activity   Assist     Assist level: 2 helpers Assistive device: Walker-Eva   Walk 50 feet activity   Assist Walk 50 feet with 2 turns activity did not occur: Safety/medical concerns (fatigue, unsafe)         Walk 150 feet activity   Assist Walk 150 feet activity did not occur: Safety/medical concerns (fatigue, unsafe)         Walk 10 feet on uneven surface  activity   Assist Walk 10 feet on uneven surfaces activity did not occur: Safety/medical concerns (fatigue, unsafe)         Wheelchair     Assist Is the patient using a wheelchair?: Yes Type of Wheelchair: Manual    Wheelchair assist level: Supervision/Verbal cueing Max  wheelchair distance: 230'    Wheelchair 50 feet with 2 turns activity    Assist        Assist Level: Supervision/Verbal cueing   Wheelchair 150 feet activity     Assist      Assist Level: Supervision/Verbal cueing   Blood pressure 135/81, pulse 81, temperature 98.5 F (36.9 C), temperature source Oral, resp. rate 17, height 5' 9 (1.753 m), weight 83.2 kg, SpO2 98%.  Medical Problem List and Plan: 1. 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  Status post L4-5 transforaminal interbody fusion/posterior lateral fusion insertion interbody device L4-5 decompression 09/08/2024 per Dr. Beuford.  Back brace when out of bed             -patient may shower             -ELOS/Goals: 10-14 days              Con't CIR PT and OT  Vascular involved -CTA today  Team conference today to determine length of stay vs going back to Acute for possible surgical intervention 2.  Antithrombotics: -DVT/anticoagulation:  Mechanical: Antiembolism stockings, thigh (TED hose) Bilateral lower extremities.  Check vascular study 9/23- might be put on Heparin  gtt today- so wait to start Lovenox              -antiplatelet therapy: N/A 3. Pain Management: Neurontin  600 mg 3 times daily, Robaxin  and oxycodone  as needed  9/18- Gave a 1x dose of Oxy 15 mg, Increased his Oxy to 10-15 mg q4 hours prn for pain; changed Robaxin  to 1000 mg QID, increased Gabapentin  to 900  mg TID.Also added Lidoderm   for nerve pain on RLE.    -9/19 patient reports continued pain in his leg, improved with getting up.  Does not appear to be getting oxycodone  frequently today.  Continue current regimen and monitor.  Appears more comfortable today compared to description yesterday  9/20 will change gabapentin  to Lyrica  3 times daily 150 mg for neuropathic pain  9/21  lidocaine  patch R skin, occasional use of oxycodone    9/22- will start Oxycontin  10 mg BID for the next 1 week to see  if that will help pain.   9/23- pain slightly better, but still a major issue 4. Anxiety: would benefit from neuropsych eval, magnesium  supplement started.  Ambien  as needed  9/18- Will start Duloxetine  for nerve pain 30 mg at bedtime  9/19 may take several days for duloxetine  to take effect             -antipsychotic agents: N/A 5. Neuropsych/cognition: This patient is capable of making decisions on his own behalf. 6. Tachycardia: magnesium  supplement started   -9/20-21 HR in the 80s and 90s continue to monitor    09/21/2024    5:00 AM 09/20/2024    8:59 PM 09/20/2024    3:29 PM  Vitals with BMI  Systolic 135 120 884  Diastolic 81 72 77  Pulse 81 87 89    7. Fluids/Electrolytes/Nutrition: Routine in and outs with follow-up chemistries.   8.  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  Patient responded to epi drip and Decadron .   9.  Hyperlipidemia.  Pravachol    10.  COPD.  Remote tobacco use.  Continue inhalers as directed.  Check oxygen  saturations every shift   11.  History of prostate cancer.  Status post prostatectomy.  Continue Flomax  0.4 mg daily.   12.  History of detached retina left eye.  Follow-up outpatient ophthalmology   13. Transaminitis  9/18- AST 125 and ALT 161- will stop Pravachol  20 mg daily and decrease Tylenol  to 325 mg q6 hours prn from 650 mg. Last LFTs were normal 7-8 days ago.   9/23- will restart Pravachol  at lower dose- 10 mg daily and recheck labs in AM 14. Azotemia  9/18- BUN up to 26 from 12- will push fluids and recheck in AM.   9/19 BUN decreased to 20, continue encourage oral fluids, recheck tomorrow  9/20 BUN and creatinine improved today, continue current  9/22- Cr 0.87 and BUN 18- doing better 15. Mild Leukocytosis  9/18- will recheck in AM- has had decadron - no Sx's of UTI/URI.   9/19 WBC a little higher today, may be due to recent Decadron , no signs of infection  9/20 no signs infection noted continue to  monitor  9/22- No signs of infection- WBC stable  16. R heel DTI  9/18- Prevalon ordered and pt has WOC consult ordered  Patient was seen by wound VAC for deep pressure injury, appreciate assistance and recommendations  9/21 also has some brusing on R toe, appears similar/ a little improve to prior  image. RLE edema VAS US  neg couple days ago . Spoke with vascular, will order ABI and VAS U/S R, appreciate assistance  9/22- Arterial duplex ordered for RLE this weekend- is pending- ordered ASA 81 mg per vascular- cannot order Statin because pt's LFTs significantly elevated- will try to restart if LFT's have improved this week  9/23- restarted low dose Pravachol   -CTA today- Vascular involved 17. Loose Bms  -hold miralax   -9/21 LBM yesterday, monitor  9/23- having tiny Bms per pt- will order Sorbitol  today at 1pm- if has CTA by then- if not, will give sorbitol  tomorrow 18. Hyponatremia  -9/21 130 today, add salt tabs  9/22- Na 132 19. Urinary retention  9/22- started Flomax  last week, but changed to q supper- also will replace Foley- he's having a lot of pain with In/out caths and asked for foley to be replaced.   9/23- foley back in-   I spent a total of  55  minutes on total care today- >50% coordination of care- due to  D/w vascular surgery and reviewed note- getting CTA today- also d/w PA at length and nursing. Also team conference to determine length of stay  LOS: 6 days A FACE TO FACE EVALUATION WAS PERFORMED  Haylo Fake 09/21/2024, 8:39 AM

## 2024-09-21 NOTE — H&P (View-Only) (Signed)
  Progress Note    09/21/2024 7:47 AM * No surgery found *  Subjective:  persistent right leg pain  Vitals:   09/20/24 2059 09/21/24 0500  BP: 120/72 135/81  Pulse: 87 81  Resp: 17 17  Temp: 98.3 F (36.8 C) 98.5 F (36.9 C)  SpO2: 98% 98%    Physical Exam: Aaox3 Non labored respirations no right lower extremity leg evaluation performed secondary to patient pain  CBC    Component Value Date/Time   WBC 11.1 (H) 09/19/2024 1312   RBC 3.83 (L) 09/19/2024 1312   HGB 12.5 (L) 09/19/2024 1312   HCT 37.0 (L) 09/19/2024 1312   PLT 262 09/19/2024 1312   MCV 96.6 09/19/2024 1312   MCH 32.6 09/19/2024 1312   MCHC 33.8 09/19/2024 1312   RDW 13.2 09/19/2024 1312   LYMPHSABS 1.9 09/17/2024 0507   MONOABS 0.9 09/17/2024 0507   EOSABS 0.1 09/17/2024 0507   BASOSABS 0.1 09/17/2024 0507    BMET    Component Value Date/Time   NA 130 (L) 09/19/2024 1312   K 4.0 09/19/2024 1312   CL 95 (L) 09/19/2024 1312   CO2 25 09/19/2024 1312   GLUCOSE 139 (H) 09/19/2024 1312   BUN 18 09/19/2024 1312   CREATININE 0.87 09/19/2024 1312   CREATININE 1.06 10/10/2020 0915   CALCIUM  9.0 09/19/2024 1312   GFRNONAA >60 09/19/2024 1312   GFRNONAA 73 10/10/2020 0915   GFRAA 84 10/10/2020 0915    INR    Component Value Date/Time   INR 1.2 08/22/2022 0833     Intake/Output Summary (Last 24 hours) at 09/21/2024 0747 Last data filed at 09/21/2024 0539 Gross per 24 hour  Intake 903 ml  Output 3725 ml  Net -2822 ml     Assessment:  68 y.o. male is status post TLIF with pedicle screw fixation L4/5, duplex demonstrates monophasic flow below the common iliacs on the right where he is stented and he has an occluded SFA and popliteal segment with absent flow in his tibial arteries.  Plan: Plan for angiogram today from left common femoral approach with Dr. Serene in the Cath Lab.  He will be n.p.o. after breakfast.  We discussed that this is a limb threatening situation he demonstrated good  understanding.  We also discussed that he will possibly require surgical intervention.  CK was ordered.   Ethyn Schetter C. Sheree, MD [facility] Office: [phone] Pager: [phone]  09/21/2024 7:47 AM

## 2024-09-21 NOTE — Progress Notes (Signed)
 Arrived back to CIR.   Geni Armor, LPN

## 2024-09-21 NOTE — Progress Notes (Signed)
 During assessment patients right foot is noticeably colder than the left foot. PA notified. All other needs met at this time.

## 2024-09-21 NOTE — Interval H&P Note (Signed)
 History and Physical Interval Note:  09/21/2024 11:23 AM  David King  has presented today for surgery, with the diagnosis of PAD.  The various methods of treatment have been discussed with the patient and family. After consideration of risks, benefits and other options for treatment, the patient has consented to  Procedure(s): ABDOMINAL AORTOGRAM W/LOWER EXTREMITY (N/A) as a surgical intervention.  The patient's history has been reviewed, patient examined, no change in status, stable for surgery.  I have reviewed the patient's chart and labs.  Questions were answered to the patient's satisfaction.     [name]

## 2024-09-21 NOTE — Progress Notes (Signed)
 Occupational Therapy Session Note  Patient Details  Name: David King MRN: [id number] Date of Birth: 1956/02/23  Today's Date: 09/21/2024 OT Individual Time: 0915-1010 OT Individual Time Calculation (min): 55 min    Short Term Goals: Week 1:  OT Short Term Goal 1 (Week 1): Pt will complete LB dressing with Min A OT Short Term Goal 2 (Week 1): Pt will complete toileting with Min A OT Short Term Goal 3 (Week 1): Pt will complete functional transfers with Min A OT Short Term Goal 4 (Week 1): Pt will don/doff TLSO brace with Mod A  Skilled Therapeutic Interventions/Progress Updates:      Therapy Documentation Precautions:  Precautions Precautions: None, Back Precaution Booklet Issued: Yes (comment) Recall of Precautions/Restrictions: Intact Precaution/Restrictions Comments: watch BP (orthostatic) Required Braces or Orthoses: Spinal Brace Spinal Brace: Thoracolumbosacral orthotic, Applied in sitting position Restrictions Weight Bearing Restrictions Per Provider Order: No General:  Pt supine in bed upon OT arrival, agreeable to OT session. Nsg present assisting pt with foley d/t pain.   Pain:  5/10 pain reported in back and RLE, activity, intermittent rest breaks, distractions provided for pain management, pt reports tolerable to proceed.   ADL: OT providing skilled intervention on ADL retraining in order to increase independence with tasks and increase activity tolerance. Pt completed the following tasks at the current level of assist: Bed mobility: SBA supine><EOB and scooting up in bed using bed rails Toilet transfer: CGA with RW ambulating from bed><standard toilet, TLSO donned before ambulation Toileting: Min A, assistance with peri hygiene, (+) for small BM, able to manage pants UB dressing: SBA doffing overhead shirt and donning hospital gown   Pt supine in bed with direct handoff to transport taking pt to procedure.    Therapy/Group: Individual Therapy  Camie Hoe,  OTD, OTR/L 09/21/2024, 3:56 PM

## 2024-09-21 NOTE — Patient Care Conference (Signed)
 Inpatient RehabilitationTeam Conference and Plan of Care Update Date: 09/21/2024   Time: 1136 am    Patient Name: David King      Medical Record Number: [id number]  Date of Birth: Sep 23, 1956 Sex: Male         Room/Bed: 4W25C/4W25C-01 Payor Info: Payor: [employer] MEDICARE / Plan: [facility] MEDICARE / Product Type: *No Product type* /    Admit Date/Time:  09/15/2024  6:27 PM  Primary Diagnosis:  Lumbar radiculopathy  Hospital Problems: Principal Problem:   Lumbar radiculopathy    Expected Discharge Date: Expected Discharge Date: 09/30/24  Team Members Present: Physician leading conference: Dr. Duwaine Barrs Social Worker Present: Rhoda Clement, LCSW Nurse Present: Eulalio Falls, RN PT Present: Other (comment) Lessie Fast, PT) OT Present: Camie Hoe, OT SLP Present: Recardo Mole, SLP PPS Coordinator present : Eleanor Colon, SLP     Current Status/Progress Goal Weekly Team Focus  Bowel/Bladder   Patient has a foley catheter in place.   Patient able to void without any difficulty.        Swallow/Nutrition/ Hydration               ADL's   CGA transfers, Min-Mod A LB ADLs, SBA UB ADLs, Min A TLSO   mod I   balance, pain management    Mobility   Pt amb max 46' with RW, CGA/Supervision.  RLE pain, sensitivity, foot drop.  Transfers Supervision/CGA.  Requires moderate encouragement during sessions.   Mod I  NMR, strengthening/endurance tx, balance/proprioception, fall prevention, pt education    Communication                Safety/Cognition/ Behavioral Observations               Pain   Patient continues complaining of pain.   Patient is pain free.   Assess pain q shift and as needed.    Skin    Wound right anterior foot DTI bilateral buttocks DTI right heel Right toe wound Surgical incision back   Wounds will heal discharge    Assess wounds q shift    Discharge Planning:  Home with wife who can assist but does have knee issues. Wife is here  daily to provide support. Await team's recommendations for DME and follow up.    Team Discussion: Patient was admitted post lumbar decompression due to lumbar radiculopathy. Patient with pain, anxiety/tachycardia/urinary retention: medication adjusted by MD. Patient limited by right foot drop, leg sensitivity, self limiting behaviors and poor endurance.  Patient on target to meet rehab goals: Currently patient needs min-mod assist with ADLs. Patient needs supervision/CGA with transfers. Patient was able to ambulate up to 3' with max assistance using a rolling walker. Patient refuses I and O catheterization. Overall goals at discharge are set for mod I assistance.   *See Care Plan and progress notes for long and short-term goals.   Revisions to Treatment Plan:  Prevalon boots WOC consult TLSO brace  Foley catheter insertion AFO RLE  Teaching Needs: Safety, medications, transfers, TLSO brace education, toileting, etc.   Current Barriers to Discharge: Decreased caregiver support, Home enviroment access/layout, and Wound care  Possible Resolutions to Barriers: Family Education      Medical Summary Current Status: pt refused in/out caths, so foley placed- has Absent pulses and some reduced flow for RLE- vascular pain  Barriers to Discharge: Behavior/Mood;Uncontrolled Pain;Self-care education;Weight bearing restrictions;Medical stability;Neurogenic Bowel & Bladder;Pending surgery/plan  Barriers to Discharge Comments: limited by RLE vascular pain- self limiting somewhat-  cannot examine RLE- Neurogenic bladder since surgery- pain really limiting; R foot drop-need AFO RLE, poor endruance Possible Resolutions to [employer] Focus: pt doesn't want to believe is doing better- is groggy, falls asleep-  d/c- 10/2 if doesn't go for surgery   Continued Need for Acute Rehabilitation Level of Care: The patient requires daily medical management by a physician with specialized training in  physical medicine and rehabilitation for the following reasons: Direction of a multidisciplinary physical rehabilitation program to maximize functional independence : Yes Medical management of patient stability for increased activity during participation in an intensive rehabilitation regime.: Yes Analysis of laboratory values and/or radiology reports with any subsequent need for medication adjustment and/or medical intervention. : Yes   I attest that I was present, lead the team conference, and concur with the assessment and plan of the team.   Cynthya Yam Gayo 09/21/2024, 1136 am

## 2024-09-21 NOTE — Progress Notes (Signed)
 Notified Dan Angiulli, PA of redness, cold ness to lower extremities. PA made face to face visit. New orders placed.   Geni Armor, LPN

## 2024-09-22 ENCOUNTER — Inpatient Hospital Stay (HOSPITAL_COMMUNITY)

## 2024-09-22 ENCOUNTER — Other Ambulatory Visit (HOSPITAL_COMMUNITY): Payer: Self-pay

## 2024-09-22 ENCOUNTER — Encounter (HOSPITAL_COMMUNITY): Payer: Self-pay | Admitting: Surgery

## 2024-09-22 DIAGNOSIS — M5416 Radiculopathy, lumbar region: Secondary | ICD-10-CM | POA: Diagnosis not present

## 2024-09-22 LAB — LIPID PANEL
Cholesterol: 133 mg/dL (ref 0–200)
HDL: 26 mg/dL — ABNORMAL LOW (ref >40)
LDL Cholesterol: 95 mg/dL (ref 0–99)
Total CHOL/HDL Ratio: 5.1 ratio
Triglycerides: 60 mg/dL (ref <150)
VLDL: 12 mg/dL (ref 0–40)

## 2024-09-22 LAB — COMPREHENSIVE METABOLIC PANEL WITH GFR
ALT: 87 U/L — ABNORMAL HIGH (ref 0–44)
AST: 147 U/L — ABNORMAL HIGH (ref 15–41)
Albumin: 2.7 g/dL — ABNORMAL LOW (ref 3.5–5.0)
Alkaline Phosphatase: 69 U/L (ref 38–126)
Anion gap: 10 (ref 5–15)
BUN: 14 mg/dL (ref 8–23)
CO2: 24 mmol/L (ref 22–32)
Calcium: 8.5 mg/dL — ABNORMAL LOW (ref 8.9–10.3)
Chloride: 97 mmol/L — ABNORMAL LOW (ref 98–111)
Creatinine, Ser: 0.88 mg/dL (ref 0.61–1.24)
GFR, Estimated: >60 mL/min (ref >60)
Glucose, Bld: 104 mg/dL — ABNORMAL HIGH (ref 70–99)
Potassium: 3.6 mmol/L (ref 3.5–5.1)
Sodium: 131 mmol/L — ABNORMAL LOW (ref 135–145)
Total Bilirubin: 1.2 mg/dL (ref 0.0–1.2)
Total Protein: 6.2 g/dL — ABNORMAL LOW (ref 6.5–8.1)

## 2024-09-22 LAB — HEPARIN LEVEL (UNFRACTIONATED): Heparin Unfractionated: 0.12 [IU]/mL — ABNORMAL LOW (ref 0.30–0.70)

## 2024-09-22 MED ORDER — HEPARIN (PORCINE) 25000 UT/250ML-% IV SOLN
1550.0000 [IU]/h | INTRAVENOUS | Status: DC
  Administered 2024-09-22: 1300 [IU]/h via INTRAVENOUS
  Administered 2024-09-23: 1500 [IU]/h via INTRAVENOUS
  Filled 2024-09-22 (×3): qty 250

## 2024-09-22 MED ORDER — ZINC SULFATE 220 (50 ZN) MG PO CAPS: 220.0000 mg | ORAL_CAPSULE | Freq: Every day | ORAL | Status: DC

## 2024-09-22 MED ORDER — PRAVASTATIN SODIUM 10 MG PO TABS: 10.0000 mg | ORAL_TABLET | Freq: Every day | ORAL | Status: DC

## 2024-09-22 MED ORDER — HEPARIN BOLUS VIA INFUSION
4000.0000 [IU] | Freq: Once | INTRAVENOUS | Status: DC
  Filled 2024-09-22: qty 4000

## 2024-09-22 MED ORDER — MAGNESIUM GLUCONATE 500 (27 MG) MG PO TABS: 250.0000 mg | ORAL_TABLET | Freq: Every day | ORAL | Status: DC

## 2024-09-22 MED ORDER — ALBUTEROL SULFATE (2.5 MG/3ML) 0.083% IN NEBU: 3.0000 mL | INHALATION_SOLUTION | Freq: Four times a day (QID) | RESPIRATORY_TRACT | Status: DC | PRN

## 2024-09-22 MED ORDER — PREGABALIN 150 MG PO CAPS: 150.0000 mg | ORAL_CAPSULE | Freq: Three times a day (TID) | ORAL | Status: DC

## 2024-09-22 MED ORDER — DOCUSATE SODIUM 100 MG PO CAPS: 100.0000 mg | ORAL_CAPSULE | Freq: Two times a day (BID) | ORAL | Status: DC

## 2024-09-22 MED ORDER — CITALOPRAM HYDROBROMIDE 20 MG PO TABS
20.0000 mg | ORAL_TABLET | Freq: Every day | ORAL | Status: DC
  Administered 2024-09-22 – 2024-09-23 (×2): 20 mg via ORAL
  Filled 2024-09-22 (×3): qty 1

## 2024-09-22 MED ORDER — ONDANSETRON HCL 4 MG PO TABS: 4.0000 mg | ORAL_TABLET | Freq: Four times a day (QID) | ORAL | Status: DC | PRN

## 2024-09-22 MED ORDER — LIDOCAINE 5 % EX PTCH: 2.0000 | MEDICATED_PATCH | CUTANEOUS | Status: DC

## 2024-09-22 MED ORDER — ACETAMINOPHEN 325 MG PO TABS: 325.0000 mg | ORAL_TABLET | ORAL | Status: DC | PRN

## 2024-09-22 MED ORDER — ZOLPIDEM TARTRATE 5 MG PO TABS: 5.0000 mg | ORAL_TABLET | Freq: Every evening | ORAL | Status: DC | PRN

## 2024-09-22 MED ORDER — SORBITOL 70 % SOLN: 30.0000 mL | Status: DC | PRN

## 2024-09-22 MED ORDER — OXYCODONE HCL 15 MG PO TABS: 15.0000 mg | ORAL_TABLET | ORAL | Status: DC | PRN

## 2024-09-22 MED ORDER — DULOXETINE HCL 30 MG PO CPEP: 30.0000 mg | ORAL_CAPSULE | Freq: Every day | ORAL | Status: DC

## 2024-09-22 MED ORDER — SODIUM CHLORIDE 1 G PO TABS: 1.0000 g | ORAL_TABLET | Freq: Two times a day (BID) | ORAL | Status: DC

## 2024-09-22 MED ORDER — SENNOSIDES-DOCUSATE SODIUM 8.6-50 MG PO TABS: 1.0000 | ORAL_TABLET | Freq: Every evening | ORAL | Status: DC | PRN

## 2024-09-22 MED ORDER — METHOCARBAMOL 1000 MG PO TABS: 1000.0000 mg | ORAL_TABLET | Freq: Four times a day (QID) | ORAL | Status: DC

## 2024-09-22 MED ORDER — TAMSULOSIN HCL 0.4 MG PO CAPS: 0.4000 mg | ORAL_CAPSULE | Freq: Every day | ORAL | Status: DC

## 2024-09-22 MED ORDER — OXYCODONE HCL ER 10 MG PO T12A: 10.0000 mg | EXTENDED_RELEASE_TABLET | Freq: Two times a day (BID) | ORAL | Status: DC

## 2024-09-22 MED ORDER — IOHEXOL 350 MG/ML SOLN
75.0000 mL | Freq: Once | INTRAVENOUS | Status: AC | PRN
  Administered 2024-09-22: 75 mL via INTRAVENOUS

## 2024-09-22 MED ORDER — BISACODYL 5 MG PO TBEC: 5.0000 mg | DELAYED_RELEASE_TABLET | Freq: Every day | ORAL | Status: DC | PRN

## 2024-09-22 MED ORDER — ASCORBIC ACID 1000 MG PO TABS: 1000.0000 mg | ORAL_TABLET | Freq: Every day | ORAL | Status: DC

## 2024-09-22 MED ORDER — ROSUVASTATIN CALCIUM 20 MG PO TABS
20.0000 mg | ORAL_TABLET | Freq: Every day | ORAL | Status: DC
  Administered 2024-09-22 – 2024-09-23 (×2): 20 mg via ORAL
  Filled 2024-09-22 (×3): qty 1

## 2024-09-22 MED ORDER — ASPIRIN 81 MG PO TBEC: 81.0000 mg | DELAYED_RELEASE_TABLET | Freq: Every day | ORAL | Status: AC

## 2024-09-22 MED ORDER — OXYCODONE HCL 10 MG PO TABS
10.0000 mg | ORAL_TABLET | ORAL | 0 refills | Status: DC | PRN
  Filled 2024-09-22: qty 30, 5d supply, fill #0

## 2024-09-22 NOTE — Progress Notes (Signed)
 Physical Therapy Session Note  Patient Details  Name: David King MRN: [id number] Date of Birth: 1956/10/25  Today's Date: 09/22/2024 PT Individual Time: 1015-1115 PT Individual Time Calculation (min): 60 min   Short Term Goals: Week 1:  PT Short Term Goal 1 (Week 1): Pt will perform w/c transfers with CGA PT Short Term Goal 2 (Week 1): Pt will amb x25' with RW CGA without rest PT Short Term Goal 3 (Week 1): Pt will perform supine<->sit transfers with CGA  Skilled Therapeutic Interventions/Progress Updates:    Pt up in bed, semi reclined position.  Pt agreeable for session. Pt states he has a blockage in his artery and will having another surgery Friday.  Pt states pain is 7/10 to RLE.  Pt transferred from supine->sit->pivot transfer->w/c with CGA. SABRA  Pt's brace donned EOB set up assist.  Pt self propelled to gym. Pt performed mat ex's: sit to stand x10 with proper technique. Balance/endurance tx standing with RW playing horseshoes x2, sit to stand x10. Standing with RW: heel raises x10. Pt fatigued throughout session and took rest breaks to recover as needed.  Pt able to tolerate standing with no LOB, HHA x1. Pt requires cueing to maneuver w/c to wheel linear path. Pt requesting to return to bed.  Pt is CGA for transfers; met STG's. Pt refusing to ambulate today.      Therapy Documentation Precautions:  Precautions Precautions: None, Back Precaution Booklet Issued: Yes (comment) Recall of Precautions/Restrictions: Intact Precaution/Restrictions Comments: watch BP (orthostatic) Required Braces or Orthoses: Spinal Brace Spinal Brace: Thoracolumbosacral orthotic, Applied in sitting position Restrictions Weight Bearing Restrictions Per Provider Order: No     Pain Assessment Pain Scale: 0-10 Pain Score: 7  Pain Location: Generalized Pain Intervention(s): Medication (See eMAR)     Therapy/Group: Individual Therapy  [name] Fast 09/22/2024, 4:38 PM

## 2024-09-22 NOTE — Progress Notes (Signed)
 Occupational Therapy Session Note  Patient Details  Name: David King MRN: [id number] Date of Birth: 12/07/1956  Today's Date: 09/22/2024 OT Individual Time: 1300-1330 OT Individual Time Calculation (min): 30 min    Short Term Goals: Week 1:  OT Short Term Goal 1 (Week 1): Pt will complete LB dressing with Min A OT Short Term Goal 2 (Week 1): Pt will complete toileting with Min A OT Short Term Goal 3 (Week 1): Pt will complete functional transfers with Min A OT Short Term Goal 4 (Week 1): Pt will don/doff TLSO brace with Mod A  Skilled Therapeutic Interventions/Progress Updates:      Therapy Documentation Precautions:  Precautions Precautions: None, Back Precaution Booklet Issued: Yes (comment) Recall of Precautions/Restrictions: Intact Precaution/Restrictions Comments: watch BP (orthostatic) Required Braces or Orthoses: Spinal Brace Spinal Brace: Thoracolumbosacral orthotic, Applied in sitting position Restrictions Weight Bearing Restrictions Per Provider Order: No Session 1 General: "subjective***" Pt supine in bed upon OT arrival, agreeable to OT session. "subjective***" Pt seated in W/C upon OT arrival, agreeable to OT.  Vital Signs:  Pain:  ADL:  Balance  Exercises:  Other Treatments:    ***Pt seated in W/C at end of session with W/C alarm donned, call light within reach and 4Ps assessed.  ***Pt supine in bed with bed alarm activated, 2 bed rails up, call light within reach and 4Ps assessed.   Session 2 General: "subjective***" Pt supine in bed upon OT arrival, agreeable to OT session. "subjective***" Pt seated in W/C upon OT arrival, agreeable to OT.  Vital Signs:  Pain:  ADL:  Balance  Exercises:  Other Treatments:    ***Pt seated in W/C at end of session with W/C alarm donned, call light within reach and 4Ps assessed.  ***Pt supine in bed with bed alarm activated, 2 bed rails up, call light within reach and 4Ps  assessed.    Therapy/Group: Individual Therapy  Camie Hoe, OTD, OTR/L 09/22/2024, 4:30 PM

## 2024-09-22 NOTE — Progress Notes (Signed)
 Physical Therapy Note  Patient Details  Name: David King MRN: [id number] Date of Birth: 1956-06-10 Today's Date: 09/21/2024    Pt having surgery today and was not seen for PT today.   Arland RAMAN Harla Mensch 09/22/2024, 7:49 AM

## 2024-09-22 NOTE — Progress Notes (Signed)
 Patient again evaluated at his bedside today.  He continues to have pain in the right lower extremity.  I very much appreciate the input from the vascular team.  I did discuss his situation with Dr. Serene yesterday, and I did review Dr. Claretta note from today.  It appears the current plan is to proceed with some additional imaging today, and an axillary bifemoral bypass procedure to follow, possibly also including a right common femoral to popliteal bypass.  Otherwise, patient continues to ambulate daily, and does continue to report resolution of his preoperative left leg pain, which was the indication for his lumbar surgery.  With regards to his vascular occlusion, he does understand that the vascular team will be on the front line managing this, however I will continue to follow him closely throughout that management and throughout the procedures he has upcoming.

## 2024-09-22 NOTE — Progress Notes (Signed)
 PHARMACIST LIPID MONITORING   David King is a 68 y.o. male admitted on 09/15/2024 with severe vascular insufficiency who is tentatively scheduled for right axillary bifemoral bypass w/ possible right common femoral to popliteal bypass.  Pharmacy has been consulted to optimize lipid-lowering therapy with the indication of secondary prevention for clinical ASCVD.  Recent Labs:  Lipid Panel (last 6 months):   Lab Results  Component Value Date   CHOL 133 09/22/2024   TRIG 60 09/22/2024   HDL 26 (L) 09/22/2024   CHOLHDL 5.1 09/22/2024   VLDL 12 09/22/2024   LDLCALC 95 09/22/2024    Hepatic function panel (last 6 months):   Lab Results  Component Value Date   AST 147 (H) 09/22/2024   ALT 87 (H) 09/22/2024   ALKPHOS 69 09/22/2024   BILITOT 1.2 09/22/2024    SCr (since admission):   Serum creatinine: 0.88 mg/dL 90/75/74 9448 Estimated creatinine clearance: 78.1 mL/min  Current therapy and lipid therapy tolerance Current lipid-lowering therapy: pravachol  Previous lipid-lowering therapies (if applicable): pravachol  only noted through search of EMR Documented or reported allergies or intolerances to lipid-lowering therapies (if applicable): none documented  Assessment:   Patient agrees with changes to lipid-lowering therapy DC pravachol  Start Crestor  20 mg po Qday  Plan:    1.Statin intensity (high intensity recommended for all patients regardless of the LDL):  Add or increase statin to high intensity.  2.Add ezetimibe (if any one of the following):   Not indicated at this time.  3.Refer to lipid clinic:   No  4.Follow-up with:  PCP   5.Follow-up labs after discharge:  Changes in lipid therapy were made. Check a lipid panel in 8-12 weeks then annually.       Benedetta Heath BS, PharmD, BCPS Clinical Pharmacist 09/22/2024 9:54 AM  Contact: [phone] after 3 PM

## 2024-09-22 NOTE — Progress Notes (Signed)
 PHARMACY - ANTICOAGULATION CONSULT NOTE  Pharmacy Consult for heparin  infusion Indication: arterial insufficiency / occlusion  Allergies  Allergen Reactions   Ancef  [Cefazolin ] Anaphylaxis    Patient Measurements: Height: 5' 9 (175.3 cm) Weight: 83.2 kg (183 lb 6.8 oz) IBW/kg (Calculated) : 70.7 HEPARIN  DW (KG): 83.2  Vital Signs: Temp: 99.4 F (37.4 C) (09/24 1417) Temp Source: Oral (09/24 1417) BP: 118/59 (09/24 1417) Pulse Rate: 102 (09/24 1417)  Labs: Recent Labs    09/21/24 0920 09/22/24 0551 09/22/24 1741  HEPARINUNFRC  --   --  0.12*  CREATININE  --  0.88  --   CKTOTAL 3,886*  --   --     Estimated Creatinine Clearance: 78.1 mL/min (by C-G formula based on SCr of 0.88 mg/dL).  Assessment: 3 YOM admitted w/ severe vascular insufficiency who is tentatively scheduled for right axillary bifemoral bypass w/ possible right common femoral to popliteal bypass. No PTA AC noted through a search of his EMR. Of note, he had recent lumbar fusion on 09/08/2024. Consult for heparin  therapy received.   9/24 Rph visited patient at bedside. He has a bruise that encompasses the entire backside of his left hand from a blood draw this morning. Patient stated the bruise developed to that size within 20 minutes of draw. I will target a lower goal for heparin  and omit the load dose given recent surgery and the above observation.  PM update: no further issues with bleeding or worsening of bruise per RN. No interruptions or issues with heparin  infusion noted.  Goal of Therapy:  Heparin  level 0.3-0.5 units/ml Monitor platelets by anticoagulation protocol: Yes   Plan:   NO BOLUS Increase heparin  infusion to 1500 units/hr Check heparin  level in 8 hours and daily while on heparin  Continue to monitor H&H and platelets  Thank you for allowing pharmacy to be a part of this patient's care.  Shelba Collier, PharmD, BCPS Clinical PharmacistContact: [phone] after 3 PM

## 2024-09-22 NOTE — Plan of Care (Signed)
  Problem: Consults Goal: RH SPINAL CORD INJURY PATIENT EDUCATION Description:  See Patient Education module for education specifics.  Outcome: Progressing   Problem: SCI BOWEL ELIMINATION Goal: RH STG MANAGE BOWEL WITH ASSISTANCE Description: STG Manage Bowel with supervision Assistance. Outcome: Progressing   Problem: SCI BLADDER ELIMINATION Goal: RH STG MANAGE BLADDER WITH ASSISTANCE Description: STG Manage Bladder With supervision Assistance Outcome: Progressing   Problem: RH SKIN INTEGRITY Goal: RH STG SKIN FREE OF INFECTION/BREAKDOWN Description: Manage skin free of infection/breakdown with supervision assistance Outcome: Progressing   Problem: RH SAFETY Goal: RH STG ADHERE TO SAFETY PRECAUTIONS W/ASSISTANCE/DEVICE Description: STG Adhere to Safety Precautions With supervision Assistance/Device. Outcome: Progressing   Problem: RH PAIN MANAGEMENT Goal: RH STG PAIN MANAGED AT OR BELOW PT'S PAIN GOAL Description: <4 w/ prns Outcome: Progressing   Problem: RH KNOWLEDGE DEFICIT SCI Goal: RH STG INCREASE KNOWLEDGE OF SELF CARE AFTER SCI Description: Manage increase knowledge of self care after  SCI with supervision assistance from spouse using educational materials provided Outcome: Progressing   Problem: Education: Goal: Understanding of CV disease, CV risk reduction, and recovery process will improve Outcome: Progressing Goal: Individualized Educational Video(s) Outcome: Progressing   Problem: Activity: Goal: Ability to return to baseline activity level will improve Outcome: Progressing   Problem: Cardiovascular: Goal: Ability to achieve and maintain adequate cardiovascular perfusion will improve Outcome: Progressing Goal: Vascular access site(s) Level 0-1 will be maintained Outcome: Progressing   Problem: Health Behavior/Discharge Planning: Goal: Ability to safely manage health-related needs after discharge will improve Outcome: Progressing

## 2024-09-22 NOTE — Evaluation (Signed)
 Recreational Therapy Assessment and Plan  Patient Details  Name: David King MRN: [id number] Date of Birth: April 19, 1956 Today's Date: 09/22/2024  Rehab Potential:  Good ELOS:   anticipated d/c to acute Fri 9/26  Assessment Hospital Problem: Principal Problem:   Lumbar radiculopathy     Past Medical History:      Past Medical History:  Diagnosis Date   Anxiety      new dx   Arthritis      lumbar   Atherosclerotic vascular disease     Cataract     COPD (chronic obstructive pulmonary disease) (HCC)     Diverticulosis     Elevated PSA     Headache(784.0)      MIGRAINES   Hypertension 2021   Iritis      CHRONIC IN LEFT EYE - SOME VISIAL IMPAIRMENT IN LEFT EYE   Neuromuscular disorder (HCC)      left leg/foot,pinched siactic nerve   Pain      PAIN LEFT HIP AND DOWN LT LEG WITH NUMBNESS IN LEFT LEG--PT STATES SCIATIC NERVE IMPINGEMENT - PT PLANS BACK IN THE NEAR SURGERY.   Peripheral vascular disease (HCC)     Prostate cancer (HCC) 03/05/2013    Adenocarcinoma   Renal cysts, acquired, bilateral 03/19/2013    several simple , CT   Urinary frequency      AND NOCTURIA        Past Surgical History:       Past Surgical History:  Procedure Laterality Date   BACK SURGERY       CARDIAC CATHETERIZATION   03/10/2018   CATARACT EXTRACTION W/ INTRAOCULAR LENS IMPLANT Left     EYE SURGERY        RETINAL SURGERY LEFT EYE   HYDROCELE EXCISION Left 03/05/2013    Procedure: HYDROCELECTOMY ADULT;  Surgeon: Donnice Gwenyth Brooks, MD;  Location: [facility];  Service: Urology;  Laterality: Left;   INCISIONAL HERNIA REPAIR N/A 06/18/2022    Procedure: REPAIR OF INTERNAL HERNIA;  Surgeon: Rubin Calamity, MD;  Location: [facility] OR;  Service: General;  Laterality: N/A;   INGUINAL HERNIA REPAIR Bilateral AGE 49   INSERTION OF MESH N/A 06/11/2022    Procedure: INSERTION OF MESH;  Surgeon: Rubin Calamity, MD;  Location: [facility] OR;  Service: General;  Laterality: N/A;    INSERTION OF MESH N/A 06/18/2022    Procedure: INSERTION OF MESH;  Surgeon: Rubin Calamity, MD;  Location: [facility] OR;  Service: General;  Laterality: N/A;   KYPHOPLASTY N/A 05/08/2023    Procedure: THORACIC SEVEN KYPHOPLASTY;  Surgeon: Beuford Anes, MD;  Location: [facility] OR;  Service: Orthopedics;  Laterality: N/A;   LAPAROSCOPY N/A 06/18/2022    Procedure: LAPAROSCOPY DIAGNOSTIC;  Surgeon: Rubin Calamity, MD;  Location: [facility] OR;  Service: General;  Laterality: N/A;   LYMPHADENECTOMY Bilateral 05/06/2013    Procedure: REDGIE;  Surgeon: Noretta Ferrara, MD;  Location: [facility];  Service: Urology;  Laterality: Bilateral;   PROSTATE BIOPSY N/A 03/05/2013    Procedure: PROSTATE BIOPSY and ultrasound;  Surgeon: Donnice Gwenyth Brooks, MD;  Location[facility];  Service: Urology;  Laterality: N/A;   REMOVAL BURSA SAC, LEFT ELBOW   1996   ROBOT ASSISTED LAPAROSCOPIC RADICAL PROSTATECTOMY N/A 05/06/2013    Procedure: ROBOTIC ASSISTED LAPAROSCOPIC RADICAL PROSTATECTOMY LEVEL 3;  Surgeon: Noretta Ferrara, MD;  Location: [facility];  Service: Urology;  Laterality: N/A;   TONSILLECTOMY        as achild   TRANSFORAMINAL LUMBAR INTERBODY  FUSION (TLIF) WITH PEDICLE SCREW FIXATION 1 LEVEL Left 09/08/2024    Procedure: LEFT-SIDED LUMBAR 4- LUMBAR 5 TRANSFORAMINAL LUMBAR INTERBODY FUSION AND DECOMPRESSION WITH INSTRUMENTATION AND ALLOGRAFT;  Surgeon: Beuford Anes, MD;  Location: [facility] OR;  Service: Orthopedics;  Laterality: Left;  LEFT-SIDED LUMBAR 4- LUMBAR 5 TRANSFORAMINAL LUMBAR INTERBODY FUSION AND DECOMPRESSION WITH INSTRUMENTATION AND ALLOGRAFT   XI ROBOTIC ASSISTED VENTRAL HERNIA N/A 06/11/2022    Procedure: ROBOTIC INCISIONAL HERNIA REPAIR WITH MESH;  Surgeon: Rubin Calamity, MD;  Location: [facility];  Service: General;  Laterality: N/A;          Assessment & Plan Clinical Impression: David King is a 68 year old right handed male with history significant for anxiety, migraine headaches, detached  retina left eye, hypertension, prostate cancer status post radical prostatectomy 2014, COPD/quit smoking 7 years ago.  Per chart review patient lives with spouse.  1 level home 4 steps to entry.  Modified independent with cane prior to admission.  Presented 09/08/2024 with low back pain radiating to the left lower extremity.  X-rays and imaging revealed left-sided lumbar radiculopathy with large disc herniation/L4-5 spinal stenosis.  Underwent left-sided L4-5 transforaminal lumbar interbody fusion/posterior lateral fusion insertion of interbody device revision L4-5 decompression 09/08/2024 per Dr. Beuford.  Noted intraoperatively patient had developed hives swelling of tongue and lips along with hypotension critical care medicine consulted possible anaphylactic shock was started on epi drip and given Decadron .  He did require short-term intubation and transferred to the ICU.  He was extubated 9/11.  MRI T-spine with no acute findings.  MRI lumbar spine showed L4-5 PLIF moderate residual foraminal stenosis..  Since his surgery he had ongoing right leg weakness and numbness neurology was consulted for recommendations.  MRI of the brain showed no acute changes.  Neurology felt that exam findings consistent with either hypersensitivity to pain or mild component of nonorganic findings.  Recommendations were for outpatient EMG if right lower extremity numbness persists.  Therapy evaluations completed with brace when out of bed.  Therapy evaluations completed due to patient decreased functional mobility was admitted for a comprehensive rehab program. Patient is currently tearful about his current condition. Patient transferred to CIR on 09/15/2024 .     Pt presents with decreased activity tolerance, decreased functional mobility, decreased balance, decreased coordination, difficulty maintaining precautions, feelings of stress Limiting pt's independence with leisure/community pursuits.  Met with pt briefly today to  discuss TR services.  Transport arrived to take pt down for CT scan.  Plan  No further TR as pt is scheduled for transfer to acute 9/26  Recommendations for other services: Neuropsych  Discharge Criteria: Patient will be discharged from TR if patient refuses treatment 3 consecutive times without medical reason.  If treatment goals not met, if there is a change in medical status, if patient makes no progress towards goals or if patient is discharged from hospital.  The above assessment, treatment plan, treatment alternatives and goals were discussed and mutually agreed upon: by patient  Jamilia Jacques 09/22/2024, 4:10 PM

## 2024-09-22 NOTE — Consult Note (Signed)
  Progress Note    09/22/2024 8:18 AM 1 Day Post-Op  Subjective: Unchanged pain right lower extremity, no pain left lower extremity  Vitals:   09/21/24 2005 09/22/24 0544  BP: (!) 144/88 132/75  Pulse: 90 81  Resp: 16 16  Temp: 98.3 F (36.8 C)   SpO2: 94% 92%    Physical Exam: Awake alert and oriented Nonlabored respirations Left upper extremity without hematoma left hand is warm well-perfused Right radial pulses 2+ No femoral pulses bilateral, no hematoma in the groins  Right foot appears stable is not cold to touch does have early ischemic of the right great toe and right heel and remains exquisitely tender to palpation throughout the right lower extremity below the knee with mild edema right relative to the left  CBC    Component Value Date/Time   WBC 11.1 (H) 09/19/2024 1312   RBC 3.83 (L) 09/19/2024 1312   HGB 12.5 (L) 09/19/2024 1312   HCT 37.0 (L) 09/19/2024 1312   PLT 262 09/19/2024 1312   MCV 96.6 09/19/2024 1312   MCH 32.6 09/19/2024 1312   MCHC 33.8 09/19/2024 1312   RDW 13.2 09/19/2024 1312   LYMPHSABS 1.9 09/17/2024 0507   MONOABS 0.9 09/17/2024 0507   EOSABS 0.1 09/17/2024 0507   BASOSABS 0.1 09/17/2024 0507    BMET    Component Value Date/Time   NA 131 (L) 09/22/2024 0551   K 3.6 09/22/2024 0551   CL 97 (L) 09/22/2024 0551   CO2 24 09/22/2024 0551   GLUCOSE 104 (H) 09/22/2024 0551   BUN 14 09/22/2024 0551   CREATININE 0.88 09/22/2024 0551   CREATININE 1.06 10/10/2020 0915   CALCIUM  8.5 (L) 09/22/2024 0551   GFRNONAA >60 09/22/2024 0551   GFRNONAA 73 10/10/2020 0915   GFRAA 84 10/10/2020 0915    INR    Component Value Date/Time   INR 1.2 08/22/2022 0833     Intake/Output Summary (Last 24 hours) at 09/22/2024 0818 Last data filed at 09/22/2024 0530 Gross per 24 hour  Intake 1673.33 ml  Output 1825 ml  Net -151.67 ml     Assessment/plan:  68 y.o. male is s/p: Diagnostic angiography from attempted left common femoral access  ultimately from left brachial access.  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  We have discussed that although he does appear to have severe vascular insufficiency his left leg is asymptomatic and his right hip and thigh are also asymptomatic making me believe that his pain in the right lower extremity is not entirely ischemic in nature and likely multifactorial.  ABIs understandably not performed given tenderness in the right lower extremity duplex demonstrates monophasic flow throughout with possibly occluded tibials.  Plan will be to obtain CT angio chest abdomen pelvis today for surgical planning.  He is tentatively scheduled for right axillary bifemoral bypass with possible right common femoral to popliteal bypass.  We have discussed that he is at some risk of right lower extremity amputation and he demonstrates good understanding of this.  I will also initiate heparin  drip to prevent further ischemic insult.   Solenne Manwarren C. Sheree, MD [facility] Office: [phone] Pager: [phone]  09/22/2024 8:18 AM

## 2024-09-22 NOTE — Progress Notes (Signed)
 PROGRESS NOTE   Subjective/Complaints: Pt reports pain is barely tolerable He cannot sit up any longer because back and buttock pain.   He admits mood is pretty low and feels like a rabbit in a hole- and barely sees the light.   ROS: Per HPI   Pt denies SOB, abd pain, CP, N/V/C/D, and vision changes   (+) RLE uncontrolled pain- slightly improved with Oxycontin   Objective:   VAS US  LOWER EXTREMITY ARTERIAL DUPLEX Result Date: 09/21/2024 LOWER EXTREMITY ARTERIAL DUPLEX STUDY Patient Name:  David King  Date of Exam:   09/20/2024 Medical Rec #: [id number]      Accession #:    [id number] Date of Birth: [birth year]      Patient Gender: M Patient Age:   68 years Exam Location:  [facility] Procedure:      VAS US  LOWER EXTREMITY ARTERIAL DUPLEX Referring Phys: MURRAY COLLIER --------------------------------------------------------------------------------  Indications: Rest pain, and peripheral artery disease. High Risk Factors: Hypertension, past history of smoking, coronary artery                    disease. Other Factors: PAD.  Vascular Interventions: Hx of bilateral CIA stenting, left EIA and CFA stenting                         at [facility] 02/26/2022. Current ABI:            unable to obtain Comparison Study: Previous arterial duplex on 07/05/2024 at [facility] was limited,                   however CFA, SFA, and PFA showed triphasic waveforms. RLE ABI                   on 05/05/2024 was 1.08 Performing Technologist: Ezzie Potters RVT, RDMS  Examination Guidelines: A complete evaluation includes B-mode imaging, spectral Doppler, color Doppler, and power Doppler as needed of all accessible portions of each vessel. Bilateral testing is considered an integral part of a complete examination. Limited examinations for reoccurring indications may be performed as noted.  +-----------+--------+-----+--------+-------------------+--------+ RIGHT      PSV  cm/sRatioStenosisWaveform           Comments +-----------+--------+-----+--------+-------------------+--------+ CIA Prox   49                   dampened monophasic         +-----------+--------+-----+--------+-------------------+--------+ EIA Mid    19                   dampened monophasic         +-----------+--------+-----+--------+-------------------+--------+ CFA Mid    29                   dampened monophasic         +-----------+--------+-----+--------+-------------------+--------+ DFA        44                                               +-----------+--------+-----+--------+-------------------+--------+  SFA Prox   17                   dampened monophasic         +-----------+--------+-----+--------+-------------------+--------+ SFA Mid    16                   dampened monophasic         +-----------+--------+-----+--------+-------------------+--------+ SFA Distal              occluded                            +-----------+--------+-----+--------+-------------------+--------+ POP Prox                occluded                            +-----------+--------+-----+--------+-------------------+--------+ POP Mid                 occluded                            +-----------+--------+-----+--------+-------------------+--------+ POP Distal 10                   dampened monophasic         +-----------+--------+-----+--------+-------------------+--------+ TP Trunk   6                    dampened monophasic         +-----------+--------+-----+--------+-------------------+--------+ ATA Prox   6                    dampened monophasic         +-----------+--------+-----+--------+-------------------+--------+ ATA Mid    4                    dampened monophasic         +-----------+--------+-----+--------+-------------------+--------+ ATA Distal              occluded                             +-----------+--------+-----+--------+-------------------+--------+ PTA Prox   4                    dampened monophasic         +-----------+--------+-----+--------+-------------------+--------+ PTA Mid                 occluded                            +-----------+--------+-----+--------+-------------------+--------+ PTA Distal              occluded                            +-----------+--------+-----+--------+-------------------+--------+ PERO Prox               occluded                            +-----------+--------+-----+--------+-------------------+--------+ PERO Mid                occluded                            +-----------+--------+-----+--------+-------------------+--------+  PERO Distal             occluded                            +-----------+--------+-----+--------+-------------------+--------+ DP                      occluded                            +-----------+--------+-----+--------+-------------------+--------+  Summary: Right: Dampened monophasic waveforms of common iliac artery suggestive of more proximal disease. Unable to visualize aorta due to patient movement and discomfort. Total occlusion noted in the distal superficial femoral artery. Total occlusion noted in the proximal and mid popliteal artery (reconstitutes in distal popliteal). Total occlusion noted in the distal anterior tibial artery. Total occlusion noted in the peroneal artery. Total occlusion noted in the dorsal pedis artery. Arterial wall calcifications seen throughout lower extremity.  See table(s) above for measurements and observations. Electronically signed by Debby Robertson on 09/21/2024 at 5:12:33 PM.    Final    PERIPHERAL VASCULAR CATHETERIZATION Result Date: 09/21/2024 Images from the original result were not included. Patient name: David King MRN: [id number] DOB: 1956-01-28 Sex: male 09/21/2024 Pre-operative Diagnosis: Right leg ischemia Post-operative  diagnosis:  Same Surgeon:  [name] Procedure Performed:  1.  Ultrasound-guided access, left common femoral artery  2.  Selective injection with catheter in the common iliac artery  3.  Ultrasound-guided access, left brachial artery  4.  Catheter placement into abdominal aorta  5.  Abdominal aortogram  6.  Bilateral leg angiogram  7.  Conscious sedation, 15 minutes Indications: This is a 68 year old gentleman with history of vascular surgery with endarterectomy and iliac stenting and back surgery from a anterior and posterior approach.  He has been complaining of right leg pain.  He is here for further evaluation Procedure:  The patient was identified in the holding area and taken to room 8.  The patient was then placed supine on the table and prepped and draped in the usual sterile fashion.  A time out was called.  Conscious sedation was administered with the use of IV fentanyl  and Versed  under continuous physician and nurse monitoring.  Heart rate, blood pressure, and oxygen  saturation were continuously monitored.  Total sedation time was 50 minutes.  Ultrasound was used to evaluate the left common femoral artery.  It was patent, however there did appear to be thrombus within the common femoral artery.  A digital ultrasound image was acquired.  A micropuncture needle was used to access the left common femoral artery under ultrasound guidance.  An 018 wire was advanced without resistance and a micropuncture sheath was placed.  I then inserted a Bentson wire but met resistance.  I removed the micropuncture sheath and used a Berenstein 2 catheter.  I was confident the wire was in the iliac stents on the left but was concerned that I may be in a dissection within the aorta.  I shot a contrast injection that showed that there was opacification of his stents.  I then shot an aortogram to look like I was in a dissection.  Because the patient did not have palpable femoral pulses I elected to stop.  I held pressure  for 10 minutes. Attention was turned towards the left arm.  The left brachial artery was evaluated with ultrasound which was widely patent.  1% lidocaine   was used local anesthesia.  The left brachial artery was then cannulated under ultrasound guidance with a micropuncture needle.  An 018 wire was inserted followed by placement of micropuncture sheath.  I then placed a 5 French sheath over a Bentson wire.  Heparin  and nitroglycerin  were given through the sheath.  I then advanced a pigtail catheter into the abdominal aorta and aortogram with bilateral runoff was performed.  Aortogram: The suprarenal abdominal aorta is patent.  The visualized portions of the superior mesenteric artery and bilateral renal arteries are widely patent.  There is occlusion of the infrarenal abdominal aorta at the level of the renal arteries.  There does appear to be reconstitution of the common femoral arteries bilaterally.  Right Lower Extremity: The right common femoral profundofemoral artery appeared to be patent.  The superficial femoral artery is patent down to the adductor canal where it occludes.  Distal evaluation was not possible given proximal obstruction  Left Lower Extremity: The left common femoral profundofemoral artery appeared to be patent.  The superficial femoral artery is patent down to the adductor canal where it occludes.  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 [name], M.D., FACS [facility] Office: [phone] Pager:  [phone]    Recent Labs    09/19/24 1312  WBC 11.1*  HGB 12.5*  HCT 37.0*  PLT 262   Recent Labs    09/19/24 1312 09/22/24 0551  NA 130* 131*  K 4.0 3.6  CL 95* 97*  CO2 25 24  GLUCOSE 139* 104*  BUN 18 14  CREATININE 0.87 0.88  CALCIUM   9.0 8.5*    Intake/Output Summary (Last 24 hours) at 09/22/2024 0810 Last data filed at 09/22/2024 0530 Gross per 24 hour  Intake 1673.33 ml  Output 1825 ml  Net -151.67 ml     Wound 09/15/24 2032 Pressure Injury Buttocks Bilateral;Right Deep Tissue Pressure Injury - Purple or maroon localized area of discolored intact skin or blood-filled blister due to damage of underlying soft tissue from pressure and/or shear. (Active)     Wound 09/15/24 1848 Pressure Injury Heel Right Deep Tissue Pressure Injury - Purple or maroon localized area of discolored intact skin or blood-filled blister due to damage of underlying soft tissue from pressure and/or shear. (Active)    Physical Exam: Vital Signs Blood pressure 132/75, pulse 81, temperature 98.3 F (36.8 C), resp. rate 16, height 5' 9 (1.753 m), weight 83.2 kg, SpO2 92%.       General: awake, alert, appropriate, sad affect; laying supine in bed; NAD HENT: conjugate gaze; oropharynx moist CV: regular rate and rhythm; no JVD Pulmonary: CTA B/L; no W/R/R- good air movement GI: soft, NT, ND, (+)BS Psychiatric: appropriate, but appears more depressed Neurological: Ox3  MSK- unable to let me examine RLE due to pain TTP R shin and R foot region Extremities: legs more pale than expected- no pulses Neurological:     Comments: Patient is alert oriented x 4 following commands. Right lower extremity strength is 4/5 proximally and 2/5 distally, LLE 4/5 throughout, decreased sensation in right foot  Prior neuro assessment is c/w today's exam 09/22/2024.  9/17 image   Image 9/21   Assessment/Plan: 1. Functional deficits which require 3+ hours per day of interdisciplinary therapy in a comprehensive inpatient rehab setting. Physiatrist is  providing close team supervision and 24 hour management of active medical problems listed below. Physiatrist and rehab team continue to assess barriers to discharge/monitor patient progress toward functional  and medical goals  Care Tool:  Bathing    Body parts bathed by patient: Right arm, Left arm, Chest, Abdomen, Front perineal area, Right upper leg, Left upper leg, Face   Body parts bathed by helper: Buttocks, Left lower leg, Right lower leg     Bathing assist Assist Level: Minimal Assistance - Patient > 75%     Upper Body Dressing/Undressing Upper body dressing        Upper body assist Assist Level: Set up assist    Lower Body Dressing/Undressing Lower body dressing            Lower body assist Assist for lower body dressing: Moderate Assistance - Patient 50 - 74%     Toileting Toileting Toileting Activity did not occur Press photographer and hygiene only): Refused  Toileting assist Assist for toileting: Moderate Assistance - Patient 50 - 74%     Transfers Chair/bed transfer  Transfers assist  Chair/bed transfer activity did not occur: Safety/medical concerns  Chair/bed transfer assist level: Supervision/Verbal cueing     Locomotion Ambulation   Ambulation assist      Assist level: 2 helpers Assistive device: Gillie Max distance: 25'   Walk 10 feet activity   Assist     Assist level: 2 helpers Assistive device: Walker-Eva   Walk 50 feet activity   Assist Walk 50 feet with 2 turns activity did not occur: Safety/medical concerns (fatigue, unsafe)         Walk 150 feet activity   Assist Walk 150 feet activity did not occur: Safety/medical concerns (fatigue, unsafe)         Walk 10 feet on uneven surface  activity   Assist Walk 10 feet on uneven surfaces activity did not occur: Safety/medical concerns (fatigue, unsafe)         Wheelchair     Assist Is the patient using a wheelchair?: Yes Type of Wheelchair: Manual    Wheelchair assist level: Supervision/Verbal cueing Max wheelchair distance: 230'    Wheelchair 50 feet with 2 turns activity    Assist        Assist Level: Supervision/Verbal cueing    Wheelchair 150 feet activity     Assist      Assist Level: Supervision/Verbal cueing   Blood pressure 132/75, pulse 81, temperature 98.3 F (36.8 C), resp. rate 16, height 5' 9 (1.753 m), weight 83.2 kg, SpO2 92%.  Medical Problem List and Plan: 1. 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  Status post L4-5 transforaminal interbody fusion/posterior lateral fusion insertion interbody device L4-5 decompression 09/08/2024 per Dr. Beuford.  Back brace when out of bed             -patient may shower             -ELOS/Goals: 10-14 days              Con't CIR PT and OT  D/c to acute Friday AM  Vascular getting CT today as well  as starting on heparin  gtt 2.  Antithrombotics: -DVT/anticoagulation:  Mechanical: Antiembolism stockings, thigh (TED hose) Bilateral lower extremities.  Check vascular study 9/23- might be put on Heparin  gtt today- so wait to start Lovenox              -antiplatelet therapy: N/A 3. Pain Management: Neurontin   600 mg 3 times daily, Robaxin  and oxycodone  as needed  9/18- Gave a 1x dose of Oxy 15 mg, Increased his Oxy to 10-15 mg q4 hours prn for pain; changed Robaxin  to 1000 mg QID, increased Gabapentin  to 900 mg TID.Also added Lidoderm   for nerve pain on RLE.    -9/19 patient reports continued pain in his leg, improved with getting up.  Does not appear to be getting oxycodone  frequently today.  Continue current regimen and monitor.  Appears more comfortable today compared to description yesterday  9/20 will change gabapentin  to Lyrica  3 times daily 150 mg for neuropathic pain  9/21  lidocaine  patch R skin, occasional use of oxycodone    9/22- will start Oxycontin  10 mg BID for the next 1 week to see if that will help pain.   9/23- pain slightly better, but still a major issue  9/24- pt reports pain is barely tolerable but that' sbetter than when we started- d/w pt 4. Anxiety: would benefit from neuropsych  eval, magnesium  supplement started.  Ambien  as needed  9/18- Will start Duloxetine  for nerve pain 30 mg at bedtime  9/19 may take several days for duloxetine  to take effect  9/24- will add Celexa - I know he's on Duloxetine  for nerve pain, but not as good of SNRI for mood- so will combo them- also obviously he's having grief reaction ot possibly losing RLE             -antipsychotic agents: N/A 5. Neuropsych/cognition: This patient is capable of making decisions on his own behalf. 6. Tachycardia: magnesium  supplement started   -9/20-21 HR in the 80s and 90s continue to monitor  9/24- doing well    09/22/2024    5:44 AM 09/21/2024    8:05 PM 09/21/2024    2:07 PM  Vitals with BMI  Systolic 132 144 848  Diastolic 75 88 79  Pulse 81 90 85    7. Fluids/Electrolytes/Nutrition: Routine in and outs with follow-up chemistries.   8.  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  Patient responded to epi drip and Decadron .   9.  Hyperlipidemia.  Pravachol    10.  COPD.  Remote tobacco use.  Continue inhalers as directed.  Check oxygen  saturations every shift   11.  History of prostate cancer.  Status post prostatectomy.  Continue Flomax  0.4 mg daily.   12.  History of detached retina left eye.  Follow-up outpatient ophthalmology   13. Transaminitis  9/18- AST 125 and ALT 161- will stop Pravachol  20 mg daily and decrease Tylenol  to 325 mg q6 hours prn from 650 mg. Last LFTs were normal 7-8 days ago.   9/23- will restart Pravachol  at lower dose- 10 mg daily and recheck labs in AM  9/24- AST 147 and ALT down to 87- so AST went up and ALT went down some- could be lack of blood flow? 14. Azotemia  9/18- BUN up to 26 from 12- will push fluids and recheck in AM.   9/19 BUN decreased to 20, continue encourage oral fluids, recheck tomorrow  9/20 BUN and creatinine improved today, continue current  9/22- Cr 0.87 and BUN 18- doing better 15. Mild Leukocytosis  9/18- will recheck in  AM- has had decadron - no Sx's of UTI/URI.   9/19 WBC a little higher today, may be due to recent Decadron , no signs of infection  9/20 no signs infection noted continue to monitor  9/22- No signs of infection- WBC stable  16. R heel DTI  9/18- Prevalon ordered and pt has WOC consult ordered  Patient was seen by wound VAC for deep pressure injury, appreciate assistance and recommendations  9/21 also has some brusing on R toe, appears similar/ a little improve to prior  image. RLE edema VAS US  neg couple days ago . Spoke with vascular, will order ABI and VAS U/S R, appreciate assistance  9/22- Arterial duplex ordered for RLE this weekend- is pending- ordered ASA 81 mg per vascular- cannot order Statin because pt's LFTs significantly elevated- will try to restart if LFT's have improved this week  9/23- restarted low dose Pravachol   -CTA today- Vascular involved  9/24- is blocked from mid aorta down per vascular- and needs surgery to fix- is leaving for Bifemoral aorto bypass with fem-pop on RLE-getting CT today per vascular and putting on Heparin  gtt 17. Loose Bms  -hold miralax   -9/21 LBM yesterday, monitor   9/23- having tiny Bms per pt- will order Sorbitol  today at 1pm- if has CTA by then- if not, will give sorbitol  tomorrow 18. Hyponatremia  -9/21 130 today, add salt tabs  9/22- Na 132 19. Urinary retention  9/22- started Flomax  last week, but changed to q supper- also will replace Foley- he's having a lot of pain with In/out caths and asked for foley to be replaced.   9/23- foley back in-   I spent a total of 53   minutes on total care today- >50% coordination of care- due to  D/w pt, d/w Dr Sheree about plan for stents and fixing lack of flow- might lose RLE-- also d/w pt again about his mood- started meds  LOS: 7 days A FACE TO FACE EVALUATION WAS PERFORMED  Orin Eberwein 09/22/2024, 8:10 AM

## 2024-09-22 NOTE — Progress Notes (Addendum)
 PHARMACY - ANTICOAGULATION CONSULT NOTE  Pharmacy Consult for heparin  infusion Indication: arterial insufficiency / occlusion  Allergies  Allergen Reactions   Ancef  [Cefazolin ] Anaphylaxis    Patient Measurements: Height: 5' 9 (175.3 cm) Weight: 83.2 kg (183 lb 6.8 oz) IBW/kg (Calculated) : 70.7 HEPARIN  DW (KG): 83.2  Vital Signs: BP: 132/75 (09/24 0544) Pulse Rate: 81 (09/24 0544)  Labs: Recent Labs    09/19/24 1312 09/21/24 0920 09/22/24 0551  HGB 12.5*  --   --   HCT 37.0*  --   --   PLT 262  --   --   CREATININE 0.87  --  0.88  CKTOTAL  --  3,886*  --     Estimated Creatinine Clearance: 78.1 mL/min (by C-G formula based on SCr of 0.88 mg/dL).   Medical History: Past Medical History:  Diagnosis Date   Anxiety    new dx   Arthritis    lumbar   Atherosclerotic vascular disease    Cataract    COPD (chronic obstructive pulmonary disease) (HCC)    Diverticulosis    Elevated PSA    Headache(784.0)    MIGRAINES   Hypertension 2021   Iritis    CHRONIC IN LEFT EYE - SOME VISIAL IMPAIRMENT IN LEFT EYE   Neuromuscular disorder (HCC)    left leg/foot,pinched siactic nerve   Pain    PAIN LEFT HIP AND DOWN LT LEG WITH NUMBNESS IN LEFT LEG--PT STATES SCIATIC NERVE IMPINGEMENT - PT PLANS BACK IN THE NEAR SURGERY.   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 8.5 g 2    cholecalciferol (VITAMIN D3) 25 MCG (1000 UNIT) tablet Take 1,000 Units by mouth daily.      Coal Tar Extract [phone] PSORIASIS MEDICATED EX) Apply 1 Application topically 3 (three) times daily as needed (psoriasis).       diphenhydramine -acetaminophen  (TYLENOL  PM EXTRA STRENGTH) 25-500 MG TABS tablet Take 2 tablets by mouth at bedtime.      gabapentin  (NEURONTIN ) 300 MG capsule Take 600 mg by mouth 2 (two) times daily.      ipratropium (ATROVENT ) 0.03 % nasal spray Place 2 sprays into both nostrils 2 (two) times daily.      pravastatin  (PRAVACHOL ) 20 MG tablet Take 20 mg by mouth in the morning.      TRELEGY ELLIPTA  100-62.5-25 MCG/ACT AEPB USE 1 INHALATION BY MOUTH INTO  THE LUNGS ONCE DAILY AT THE SAME TIME EACH DAY 180 each 3     Assessment: 29 YOM admitted w/ severe vascular insufficiency who is tentatively scheduled for right axillary bifemoral bypass w/ possible right common femoral to popliteal bypass. No PTA AC noted through a search of his EMR. Of note, he had recent lumbar fusion on 09/08/2024. Consult for heparin  therapy received. Visited patient at bedside. He has a bruise that encompasses the entire backside of his left hand from a blood draw this morning. Patient stated the bruise developed to that size within 20 minutes of draw. I will target a lower goal for heparin  and omit the load dose given recent surgery and the above observation  Goal  of Therapy:  Heparin  level 0.3-0.5 units/ml Monitor platelets by anticoagulation protocol: Yes   Plan:  NO BOLUS Start heparin  infusion at 1300 units/hr Check anti-Xa level in 8 hours and daily while on heparin  Continue to monitor H&H and platelets    Frisco Cordts BS, PharmD, BCPS Clinical Pharmacist 09/22/2024 9:10 AM  Contact: [phone] after 3 PM

## 2024-09-22 NOTE — Discharge Summary (Cosign Needed Addendum)
 Patient ID: David King MRN: [id number] DOB/AGE: Oct 10, 1956 68 y.o.  Admit date: 09/08/2024 Discharge date: 09/15/2024  Admission Diagnoses:  Principal Problem:   S/P lumbar fusion Active Problems:   Radiculopathy due to lumbar intervertebral disc disorder   Discharge Diagnoses:  Same  Past Medical History:  Diagnosis Date   Anxiety    new dx   Arthritis    lumbar   Atherosclerotic vascular disease    Cataract    COPD (chronic obstructive pulmonary disease) (HCC)    Diverticulosis    Elevated PSA    Headache(784.0)    MIGRAINES   Hypertension 2021   Iritis    CHRONIC IN LEFT EYE - SOME VISIAL IMPAIRMENT IN LEFT EYE   Neuromuscular disorder (HCC)    left leg/foot,pinched siactic nerve   Pain    PAIN LEFT HIP AND DOWN LT LEG WITH NUMBNESS IN LEFT LEG--PT STATES SCIATIC NERVE IMPINGEMENT - PT PLANS BACK IN THE NEAR SURGERY.   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 male who was admitted 09/08/2024 for operative treatment of left radiculopathy Patient has severe unremitting pain that affects sleep, daily activities, and work/hobbies. After pre-op clearance the patient was taken to the operating room on 09/08/2024 and underwent  Procedure(s): LEFT-SIDED LUMBAR 4- LUMBAR 5 TRANSFORAMINAL LUMBAR INTERBODY FUSION AND DECOMPRESSION WITH INSTRUMENTATION AND ALLOGRAFT.    Patient was given perioperative antibiotics:  Anti-infectives (From admission, onward)    Start     Dose/Rate Route Frequency Ordered Stop   09/08/24 1645  ceFAZolin  (ANCEF ) IVPB 2g/100 mL  premix  Status:  Discontinued        2 g 200 mL/hr over 30 Minutes Intravenous Every 8 hours 09/08/24 1256 09/08/24 1452   09/08/24 1600  vancomycin  (VANCOCIN ) IVPB 1000 mg/200 mL premix        1,000 mg 200 mL/hr over 60 Minutes Intravenous  Once 09/08/24 1510 09/08/24 1734   09/08/24 0645  ceFAZolin  (ANCEF ) IVPB 2g/100 mL premix        2 g 200 mL/hr over 30 Minutes Intravenous On call to O.R. 09/08/24 9365 09/08/24 0915        Patient was given sequential compression devices, early ambulation to prevent DVT.  Patient benefited maximally from hospital stay and there was complication of intraoperative anaphylactic reaction to unknown agent resulting in hypotensive event    Recent vital signs: BP (!) 145/90 (BP Location: Right Arm)   Pulse 87   Temp 98.5 F (36.9 C) (Oral)   Resp 19   Ht 5' 9 (1.753 m)   Wt 93.3 kg   SpO2 98%   BMI 30.38 kg/m    Discharge Medications:   Allergies as of 09/15/2024       Reactions   Ancef  [cefazolin ] Anaphylaxis        Medication List     ASK your doctor about these medications    albuterol  108 (90 Base) MCG/ACT inhaler Commonly known as: VENTOLIN  HFA USE 1-2 PUFFS EVERY 6 HOURS AS NEEDED FOR WHEEZING OR SHORTNESS OF BREATH.   cholecalciferol 25 MCG (1000 UNIT) tablet  Commonly known as: VITAMIN D3 Take 1,000 Units by mouth daily.   gabapentin  300 MG capsule Commonly known as: NEURONTIN  Take 600 mg by mouth 2 (two) times daily.   ipratropium 0.03 % nasal spray Commonly known as: ATROVENT  Place 2 sprays into both nostrils 2 (two) times daily.   FH782 PSORIASIS MEDICATED EX Apply 1 Application topically 3 (three) times daily as needed (psoriasis).   pravastatin  20 MG tablet Commonly known as: PRAVACHOL  Take 20 mg by mouth in the morning.   Trelegy Ellipta  100-62.5-25 MCG/ACT Aepb Generic drug: Fluticasone -Umeclidin-Vilant USE 1 INHALATION BY MOUTH INTO  THE LUNGS ONCE DAILY AT THE SAME TIME EACH DAY   Tylenol  PM Extra  Strength 500-25 MG Tabs tablet Generic drug: diphenhydramine -acetaminophen  Take 2 tablets by mouth at bedtime.        Diagnostic Studies: VAS US  LOWER EXTREMITY ARTERIAL DUPLEX Result Date: 09/21/2024 LOWER EXTREMITY ARTERIAL DUPLEX STUDY Patient Name:  David King  Date of Exam:   09/20/2024 Medical Rec #: [id number]      Accession #:    [id number] Date of Birth: [birth year]      Patient Gender: M Patient Age:   47 years Exam Location:  [facility] Procedure:      VAS US  LOWER EXTREMITY ARTERIAL DUPLEX Referring Phys: MURRAY [facility] --------------------------------------------------------------------------------  Indications: Rest pain, and peripheral artery disease. High Risk Factors: Hypertension, past history of smoking, coronary artery                    disease. Other Factors: PAD.  Vascular Interventions: Hx of bilateral CIA stenting, left EIA and CFA stenting                         at [facility] 02/26/2022. Current ABI:            unable to obtain Comparison Study: Previous arterial duplex on 07/05/2024 at [facility] was limited,                   however CFA, SFA, and PFA showed triphasic waveforms. RLE ABI                   on 05/05/2024 was 1.08 Performing Technologist: Ezzie Potters RVT, RDMS  Examination Guidelines: A complete evaluation includes B-mode imaging, spectral Doppler, color Doppler, and power Doppler as needed of all accessible portions of each vessel. Bilateral testing is considered an integral part of a complete examination. Limited examinations for reoccurring indications may be performed as noted.  +-----------+--------+-----+--------+-------------------+--------+ RIGHT      PSV cm/sRatioStenosisWaveform           Comments +-----------+--------+-----+--------+-------------------+--------+ CIA Prox   49                   dampened monophasic         +-----------+--------+-----+--------+-------------------+--------+ EIA Mid    19                   dampened monophasic          +-----------+--------+-----+--------+-------------------+--------+ CFA Mid    29                   dampened monophasic         +-----------+--------+-----+--------+-------------------+--------+ DFA        44                                               +-----------+--------+-----+--------+-------------------+--------+  SFA Prox   17                   dampened monophasic         +-----------+--------+-----+--------+-------------------+--------+ SFA Mid    16                   dampened monophasic         +-----------+--------+-----+--------+-------------------+--------+ SFA Distal              occluded                            +-----------+--------+-----+--------+-------------------+--------+ POP Prox                occluded                            +-----------+--------+-----+--------+-------------------+--------+ POP Mid                 occluded                            +-----------+--------+-----+--------+-------------------+--------+ POP Distal 10                   dampened monophasic         +-----------+--------+-----+--------+-------------------+--------+ TP Trunk   6                    dampened monophasic         +-----------+--------+-----+--------+-------------------+--------+ ATA Prox   6                    dampened monophasic         +-----------+--------+-----+--------+-------------------+--------+ ATA Mid    4                    dampened monophasic         +-----------+--------+-----+--------+-------------------+--------+ ATA Distal              occluded                            +-----------+--------+-----+--------+-------------------+--------+ PTA Prox   4                    dampened monophasic         +-----------+--------+-----+--------+-------------------+--------+ PTA Mid                 occluded                            +-----------+--------+-----+--------+-------------------+--------+  PTA Distal              occluded                            +-----------+--------+-----+--------+-------------------+--------+ PERO Prox               occluded                            +-----------+--------+-----+--------+-------------------+--------+ PERO Mid                occluded                            +-----------+--------+-----+--------+-------------------+--------+  PERO Distal             occluded                            +-----------+--------+-----+--------+-------------------+--------+ DP                      occluded                            +-----------+--------+-----+--------+-------------------+--------+  Summary: Right: Dampened monophasic waveforms of common iliac artery suggestive of more proximal disease. Unable to visualize aorta due to patient movement and discomfort. Total occlusion noted in the distal superficial femoral artery. Total occlusion noted in the proximal and mid popliteal artery (reconstitutes in distal popliteal). Total occlusion noted in the distal anterior tibial artery. Total occlusion noted in the peroneal artery. Total occlusion noted in the dorsal pedis artery. Arterial wall calcifications seen throughout lower extremity.  See table(s) above for measurements and observations. Electronically signed by Debby Robertson on 09/21/2024 at 5:12:33 PM.    Final    PERIPHERAL VASCULAR CATHETERIZATION Result Date: 09/21/2024 Images from the original result were not included. Patient name: David King MRN: [id number] DOB: 1956/12/24 Sex: male 09/21/2024 Pre-operative Diagnosis: Right leg ischemia Post-operative diagnosis:  Same Surgeon:  [name] Procedure Performed:  1.  Ultrasound-guided access, left common femoral artery  2.  Selective injection with catheter in the common iliac artery  3.  Ultrasound-guided access, left brachial artery  4.  Catheter placement into abdominal aorta  5.  Abdominal aortogram  6.  Bilateral leg angiogram   7.  Conscious sedation, 15 minutes Indications: This is a 68 year old gentleman with history of vascular surgery with endarterectomy and iliac stenting and back surgery from a anterior and posterior approach.  He has been complaining of right leg pain.  He is here for further evaluation Procedure:  The patient was identified in the holding area and taken to room 8.  The patient was then placed supine on the table and prepped and draped in the usual sterile fashion.  A time out was called.  Conscious sedation was administered with the use of IV fentanyl  and Versed  under continuous physician and nurse monitoring.  Heart rate, blood pressure, and oxygen  saturation were continuously monitored.  Total sedation time was 50 minutes.  Ultrasound was used to evaluate the left common femoral artery.  It was patent, however there did appear to be thrombus within the common femoral artery.  A digital ultrasound image was acquired.  A micropuncture needle was used to access the left common femoral artery under ultrasound guidance.  An 018 wire was advanced without resistance and a micropuncture sheath was placed.  I then inserted a Bentson wire but met resistance.  I removed the micropuncture sheath and used a Berenstein 2 catheter.  I was confident the wire was in the iliac stents on the left but was concerned that I may be in a dissection within the aorta.  I shot a contrast injection that showed that there was opacification of his stents.  I then shot an aortogram to look like I was in a dissection.  Because the patient did not have palpable femoral pulses I elected to stop.  I held pressure for 10 minutes. Attention was turned towards the left arm.  The left brachial artery was evaluated with ultrasound which was widely patent.  1% lidocaine  was  used local anesthesia.  The left brachial artery was then cannulated under ultrasound guidance with a micropuncture needle.  An 018 wire was inserted followed by placement of  micropuncture sheath.  I then placed a 5 French sheath over a Bentson wire.  Heparin  and nitroglycerin  were given through the sheath.  I then advanced a pigtail catheter into the abdominal aorta and aortogram with bilateral runoff was performed.  Aortogram: The suprarenal abdominal aorta is patent.  The visualized portions of the superior mesenteric artery and bilateral renal arteries are widely patent.  There is occlusion of the infrarenal abdominal aorta at the level of the renal arteries.  There does appear to be reconstitution of the common femoral arteries bilaterally.  Right Lower Extremity: The right common femoral profundofemoral artery appeared to be patent.  The superficial femoral artery is patent down to the adductor canal where it occludes.  Distal evaluation was not possible given proximal obstruction  Left Lower Extremity: The left common femoral profundofemoral artery appeared to be patent.  The superficial femoral artery is patent down to the adductor canal where it occludes.  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 [name], M.D., [facility] [place] Office: [phone] Pager:  [phone]   VAS US  LOWER EXTREMITY VENOUS (DVT) Result Date: 09/16/2024  Lower Venous DVT Study Patient Name:  David King  Date of Exam:   09/16/2024 Medical Rec #: [id number]      Accession #:    [id number] Date of Birth: February 17, 1956      Patient Gender: M Patient Age:   63 years Exam Location:  [facility] Procedure:      VAS US  LOWER EXTREMITY VENOUS (DVT) Referring Phys: TORIBIO PITCH --------------------------------------------------------------------------------  Indications: Swelling, Edema, and Pain.  Comparison Study: No prior exam. Performing Technologist: Edilia Elden Appl  Examination Guidelines: A complete evaluation includes B-mode imaging, spectral Doppler, color Doppler, and power Doppler as needed of all accessible portions of each vessel. Bilateral testing is considered an integral part of a complete examination. Limited examinations for reoccurring indications may be performed as noted. The reflux portion of the exam is performed with the patient in reverse Trendelenburg.  +---------+---------------+---------+-----------+----------+--------------+ RIGHT    CompressibilityPhasicitySpontaneityPropertiesThrombus Aging +---------+---------------+---------+-----------+----------+--------------+ CFV      Full           Yes      Yes                                 +---------+---------------+---------+-----------+----------+--------------+ SFJ      Full           Yes      Yes                                 +---------+---------------+---------+-----------+----------+--------------+ FV Prox  Full                                                        +---------+---------------+---------+-----------+----------+--------------+ FV Mid   Full                                                        +---------+---------------+---------+-----------+----------+--------------+  FV DistalFull                                                        +---------+---------------+---------+-----------+----------+--------------+ PFV      Full                                                        +---------+---------------+---------+-----------+----------+--------------+ POP      Full           Yes      Yes                                 +---------+---------------+---------+-----------+----------+--------------+ PTV      Full                                                        +---------+---------------+---------+-----------+----------+--------------+ PERO     Full                                                         +---------+---------------+---------+-----------+----------+--------------+   +---------+---------------+---------+-----------+----------+--------------+ LEFT     CompressibilityPhasicitySpontaneityPropertiesThrombus Aging +---------+---------------+---------+-----------+----------+--------------+ CFV      Full           Yes      Yes                                 +---------+---------------+---------+-----------+----------+--------------+ SFJ      Full           Yes      Yes                                 +---------+---------------+---------+-----------+----------+--------------+ FV Prox  Full                                                        +---------+---------------+---------+-----------+----------+--------------+ FV Mid   Full                                                        +---------+---------------+---------+-----------+----------+--------------+ FV DistalFull                                                        +---------+---------------+---------+-----------+----------+--------------+  PFV      Full                                                        +---------+---------------+---------+-----------+----------+--------------+ POP      Full           Yes      Yes                                 +---------+---------------+---------+-----------+----------+--------------+ PTV      Full                                                        +---------+---------------+---------+-----------+----------+--------------+ PERO     Full                                                        +---------+---------------+---------+-----------+----------+--------------+     Summary: BILATERAL: - No evidence of deep vein thrombosis seen in the lower extremities, bilaterally. -No evidence of popliteal cyst, bilaterally.   *See table(s) above for measurements and observations. Electronically signed by Debby Robertson on 09/16/2024 at 4:04:02 PM.     Final    MR BRAIN WO CONTRAST Result Date: 09/14/2024 CLINICAL DATA:  Right leg weakness and numbness EXAM: MRI HEAD WITHOUT CONTRAST TECHNIQUE: Multiplanar, multiecho pulse sequences of the brain and surrounding structures were obtained without intravenous contrast. COMPARISON:  None Available. FINDINGS: MRI brain: The brain volume is normal. The signal in the brain parenchyma is normal. There is no acute or chronic infarct. The ventricles are normal. No mass lesion. There are normal flow signals in the carotid arteries and basilar artery. No significant bone marrow signal abnormality. Incidental left mastoid effusion. IMPRESSION: No acute infarct or other significant abnormality Electronically Signed   By: Nancyann Burns M.D.   On: 09/14/2024 10:33   MR Lumbar Spine W Wo Contrast Result Date: 09/14/2024 CLINICAL DATA:  Low back pain, prior surgery, new symptoms EXAM: MRI LUMBAR SPINE WITHOUT AND WITH CONTRAST TECHNIQUE: Multiplanar and multiecho pulse sequences of the lumbar spine were obtained without and with intravenous contrast. CONTRAST:  9mL GADAVIST  GADOBUTROL  1 MMOL/ML IV SOLN COMPARISON:  None Available. FINDINGS: Segmentation:  Standard. Alignment:  Normal. Vertebrae: No fracture, evidence of discitis, or bone lesion. L5-S1 interbody fusion. L4-L5 PLIF. Conus medullaris and cauda equina: Conus extends to the L1-L2 level. Conus and cauda equina appear normal. Paraspinal and other soft tissues: Partially imaged left renal cyst. Disc levels: T12-L1: No significant disc protrusion, foraminal stenosis, or canal stenosis. L1-L2: No significant disc protrusion, foraminal stenosis, or canal stenosis. L2-L3: Right eccentric disc bulging. Mild right foraminal stenosis. Mild right subarticular recess stenosis without significant central canal stenosis. L3-L4: Disc bulging, facet arthropathy and ligamentum thickening. Resulting mild canal stenosis and mild bilateral subarticular recess stenosis. Moderate left  and mild right foraminal stenosis. L4-L5: PLIF with metallic artifact that limits assessment but there appears to be improved left  foraminal stenosis with some enhancing granulation tissue in the left foramen and probably at least moderate foraminal stenosis. Right foramen and canal are patent. L5-S1: Interbody fusion. Endplate spurring contributes to moderate left and mild right foraminal stenosis. Patent canal. IMPRESSION: 1. At L4-L5,PLIF with metallic artifact that limits assessment but there appears to be improved left foraminal stenosis with probably at least moderate residual foraminal stenosis. 2. At L5-S1, similar moderate left and mild right foraminal stenosis. Electronically Signed   By: Gilmore GORMAN Molt M.D.   On: 09/14/2024 00:52   MR THORACIC SPINE WO CONTRAST Result Date: 09/13/2024 EXAM: MRI THORACIC SPINE WITHOUT INTRAVENOUS CONTRAST 09/13/2024 01:54:07 PM TECHNIQUE: Multiplanar multisequence MRI of the thoracic spine was performed without the administration of intravenous contrast. COMPARISON: None available. CLINICAL HISTORY: Mid-back pain. FINDINGS: BONES AND ALIGNMENT: There is straightening of the normal thoracic kyphosis. Chronic compression deformity of T7 with up to 50% height loss in the anterior and central aspect of the vertebral body with findings suggestive of kyphoplasty. There is mild anterior wedging of the T11 and T12 vertebral bodies without edema, which may be physiologic versus sequelae of mild trauma. Chronic compression deformity. The vertebral body heights are otherwise maintained. There is no evidence of acute fracture in the thoracic spine. SPINAL CORD: Normal spinal cord volume. No cord compression or cord signal abnormality. SOFT TISSUES: Unremarkable. DEGENERATIVE CHANGES: There are degenerative changes at the costovertebral articulations at multiple levels. There is no evidence of large disc herniation or high-grade spinal canal stenosis in the thoracic spine. There  is facet arthrosis at multiple levels in the thoracic spine. Mild foraminal stenosis on the left at T6-7. Moderate right and mild left foraminal stenosis at T10-11. IMPRESSION: 1. No acute findings. 2. 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 3. Mild anterior wedging of the T11 and T12 vertebral bodies without edema, which may be physiologic versus sequelae of mild chronic compression deformity. 4. Degenerative changes as above. Mild foraminal stenosis on the left at T6-7 and moderate right and mild left foraminal stenosis at T10-11. Electronically signed by: Donnice Mania MD 09/13/2024 02:39 PM EDT RP Workstation: [id number]   DG CHEST PORT 1 VIEW Result Date: 09/08/2024 CLINICAL DATA:  [id number] Endotracheally intubated [id number]. EXAM: PORTABLE CHEST 1 VIEW COMPARISON:  06/12/2023. FINDINGS: Low lung volume. Mild diffuse pulmonary vascular congestion, likely accentuated by low lung volume. No frank pulmonary edema. Bilateral lung fields are clear. There is blunting of left lateral costophrenic angle, concerning for layering trace left pleural effusion. Right lateral costophrenic angle is clear. No pneumothorax on either side. Normal cardio-mediastinal silhouette. No acute osseous abnormalities. The soft tissues are within normal limits. Endotracheal tube is seen with its tip approximately 3-3.5 cm above the carina. IMPRESSION: Blunting of left lateral costophrenic angle, concerning for layering trace left pleural effusion. Electronically Signed   By: Ree Molt M.D.   On: 09/08/2024 14:34   DG Lumbar Spine 2-3 Views Result Date: 09/08/2024 CLINICAL DATA:  Spinal fixation intraoperative images EXAM: LUMBAR SPINE - 2-3 VIEW COMPARISON:  Same day lumbar spine radiographs FLUOROSCOPY: Exposure Index (as provided by the fluoroscopic device): 26.5 mGy Kerma Fluoroscopy time: 30 seconds FINDINGS: Intraoperative images following L4-5 posterior spinal fixation with  transpedicular screws bilaterally and interconnecting rods with L4-5 interbody spacer in place. Unchanged previously identified L5-S1 interbody spacer. Vascular stents. IMPRESSION: Intraoperative images demonstrating sequelae of recent L4-5 posterior spinal fixation. Please see dedicated operative report for further details. Electronically Signed  By: Michaeline Blanch M.D.   On: 09/08/2024 14:00   DG C-Arm 1-60 Min-No Report Result Date: 09/08/2024 Fluoroscopy was utilized by the requesting physician.  No radiographic interpretation.   DG C-Arm 1-60 Min-No Report Result Date: 09/08/2024 Fluoroscopy was utilized by the requesting physician.  No radiographic interpretation.   DG C-Arm 1-60 Min-No Report Result Date: 09/08/2024 Fluoroscopy was utilized by the requesting physician.  No radiographic interpretation.   DG Lumbar Spine 1 View Result Date: 09/08/2024 CLINICAL DATA:  Elective surgery. EXAM: LUMBAR SPINE - 1 VIEW COMPARISON:  MRI of the lumbar spine dated 06/26/2024. FINDINGS: Intraoperative localization image with probes overlying the posterior spinous processes at the L4-L5 level. Prior anterior fusion at L5-S1. Multilevel degenerative changes of the lumbar spine. Aortic atherosclerosis. Bilateral common iliac artery stents are noted. IMPRESSION: Intraoperative localization image with probes overlying the posterior spinous processes at the L4-L5 level. Electronically Signed   By: Harrietta Sherry M.D.   On: 09/08/2024 10:51    Disposition:   Discharge Instructions     Ambulatory referral to Neurology   Complete by: As directed    Ref to Dr. Blanch or [facility] for clinical eval and possible need for EMG/NCS for right foot numbness and leg weakness s/p L-spine surgery with no clear dermatomal/myotomal localization at bedside. Thanks      Pt status post L4-5 decompression and fusion, with an intraoperative anaphylactic reaction, and postoperative numbness and weakness, not explained after  extensive workup.  The patient's lumbar MRI appears pristine, and neurology has not been able to identify any other pathology.  The patient's preoperative left leg pain is resolved.     - I continue to be unable to explain the patient's right lower extremity symptoms.  His extensive workup continues to be negative.  As noted above, his lumbar MRI appears pristine.  - up with PT/OT, encourage ambulation - Percocet for pain, Robaxin  for muscle spasms - Transfer to rehab today.  It is my current understanding that the bed is available to him. - I would kindly ask the PM&R team to consider an additional diagnosis of depression, and possibly treat accordingly.  As noted above, the patient was teary-eyed during portions of my evaluation with him today. -Scripts for pain sent to pharmacy electronically  -D/C instructions sheet printed in chart -D/C to CIR today  -F/U in office 2 weeks   Signed: Ileana JINNY Clara 09/22/2024, 10:47 AM

## 2024-09-23 ENCOUNTER — Other Ambulatory Visit (HOSPITAL_COMMUNITY): Payer: Self-pay

## 2024-09-23 DIAGNOSIS — I998 Other disorder of circulatory system: Secondary | ICD-10-CM

## 2024-09-23 DIAGNOSIS — M5416 Radiculopathy, lumbar region: Secondary | ICD-10-CM | POA: Diagnosis not present

## 2024-09-23 LAB — CBC WITH DIFFERENTIAL/PLATELET
Abs Immature Granulocytes: 0.12 K/uL — ABNORMAL HIGH (ref 0.00–0.07)
Basophils Absolute: 0 K/uL (ref 0.0–0.1)
Basophils Relative: 0 %
Eosinophils Absolute: 0.1 K/uL (ref 0.0–0.5)
Eosinophils Relative: 1 %
HCT: 32.4 % — ABNORMAL LOW (ref 39.0–52.0)
Hemoglobin: 10.8 g/dL — ABNORMAL LOW (ref 13.0–17.0)
Immature Granulocytes: 1 %
Lymphocytes Relative: 13 %
Lymphs Abs: 1.1 K/uL (ref 0.7–4.0)
MCH: 32.7 pg (ref 26.0–34.0)
MCHC: 33.3 g/dL (ref 30.0–36.0)
MCV: 98.2 fL (ref 80.0–100.0)
Monocytes Absolute: 0.8 K/uL (ref 0.1–1.0)
Monocytes Relative: 9 %
Neutro Abs: 6.8 K/uL (ref 1.7–7.7)
Neutrophils Relative %: 76 %
Platelets: 164 K/uL (ref 150–400)
RBC: 3.3 MIL/uL — ABNORMAL LOW (ref 4.22–5.81)
RDW: 13.2 % (ref 11.5–15.5)
WBC: 9.1 K/uL (ref 4.0–10.5)
nRBC: 0 % (ref 0.0–0.2)

## 2024-09-23 LAB — HEPARIN LEVEL (UNFRACTIONATED)
Heparin Unfractionated: 0.28 [IU]/mL — ABNORMAL LOW (ref 0.30–0.70)
Heparin Unfractionated: 0.41 [IU]/mL (ref 0.30–0.70)

## 2024-09-23 MED ORDER — MAGNESIUM CITRATE PO SOLN
1.0000 | Freq: Once | ORAL | Status: AC
  Administered 2024-09-23: 1 via ORAL
  Filled 2024-09-23: qty 296

## 2024-09-23 MED ORDER — CITALOPRAM HYDROBROMIDE 20 MG PO TABS: 20.0000 mg | ORAL_TABLET | Freq: Every day | ORAL | Status: DC

## 2024-09-23 MED ORDER — SORBITOL 70 % SOLN: 30.0000 mL | Status: DC | PRN

## 2024-09-23 MED ORDER — HEPARIN (PORCINE) 25000 UT/250ML-% IV SOLN: 1500.0000 [IU]/h | INTRAVENOUS | Status: DC

## 2024-09-23 MED ORDER — SODIUM CHLORIDE 0.9% FLUSH
10.0000 mL | Freq: Two times a day (BID) | INTRAVENOUS | Status: DC
  Administered 2024-09-24: 10 mL

## 2024-09-23 MED ORDER — ROSUVASTATIN CALCIUM 20 MG PO TABS
20.0000 mg | ORAL_TABLET | Freq: Every day | ORAL | 0 refills | Status: DC
  Filled 2024-09-23: qty 30, 30d supply, fill #0

## 2024-09-23 NOTE — Progress Notes (Addendum)
 Patient ID: David King, male   DOB: [birth year], 68 y.o.   MRN: [id number] According to MD pt is transferring to acute tomorrow for surgery and will not return to rehab. Will need to once medically stable due to has not finished his rehab or met his goals

## 2024-09-23 NOTE — Progress Notes (Signed)
 PHARMACY - ANTICOAGULATION CONSULT NOTE  Pharmacy Consult for Heparin  Indication:  arterial insufficiency / occlusion  Allergies  Allergen Reactions   Ancef  [Cefazolin ] Anaphylaxis    Patient Measurements: Height: 5' 9 (175.3 cm) Weight: 83.2 kg (183 lb 6.8 oz) IBW/kg (Calculated) : 70.7 HEPARIN  DW (KG): 83.2  Vital Signs: Temp: 98.3 F (36.8 C) (09/25 1938) Temp Source: Oral (09/25 1607) BP: 127/72 (09/25 1938) Pulse Rate: 91 (09/25 1938)  Labs: Recent Labs    09/21/24 0920 09/22/24 0551 09/22/24 1741 09/23/24 0507 09/23/24 2045  HGB  --   --   --  10.8*  --   HCT  --   --   --  32.4*  --   PLT  --   --   --  164  --   HEPARINUNFRC  --   --  0.12* 0.28* 0.41  CREATININE  --  0.88  --   --   --   CKTOTAL 3,886*  --   --   --   --     Estimated Creatinine Clearance: 78.1 mL/min (by C-G formula based on SCr of 0.88 mg/dL).  Assessment: 55 YOM admitted w/ severe vascular insufficiency who is tentatively scheduled for right axillary bifemoral bypass w/ possible right common femoral to popliteal bypass. No PTA AC noted through a search of his EMR. Of note, he had recent lumbar fusion on 09/08/2024. Consult for heparin  therapy received 09/22/24/   9/24 RPh visited patient at bedside. He has a bruise that encompasses the entire backside of his left hand from a blood draw this morning. Patient stated the bruise developed to that size within 20 minutes of draw. I will target a lower goal for heparin  and omit the load dose given recent surgery and the above observation.   PM update: no further issues with bleeding or worsening of bruise per RN. No interruptions or issues with heparin  infusion noted.  No bleeding noted; RN reports left hand bruise as noted 9/24. No issues with heparin  infusion.  Goal of Therapy:  Heparin  level 0.3-0.5 units/ml Monitor platelets by anticoagulation protocol: Yes   Plan:  Continue heparin  infusion at 1550 units/hr Check heparin  level in 8  hours with AM labs and daily while on heparin  Continue to monitor H&H and platelets  Thank you for allowing pharmacy to be a part of this patient's care.  Shelba Collier, PharmD, BCPS Clinical Pharmacist

## 2024-09-23 NOTE — Progress Notes (Signed)
 Occupational Therapy Session Note  Patient Details  Name: David King MRN: [id number] Date of Birth: Jun 04, 1956  Today's Date: 09/23/2024 OT Individual Time: 0902-1005 & 1415-1530 OT Individual Time Calculation (min): 60 min & 75 min    Short Term Goals: Week 1:  OT Short Term Goal 1 (Week 1): Pt will complete LB dressing with Min A OT Short Term Goal 2 (Week 1): Pt will complete toileting with Min A OT Short Term Goal 3 (Week 1): Pt will complete functional transfers with Min A OT Short Term Goal 4 (Week 1): Pt will don/doff TLSO brace with Mod A  Skilled Therapeutic Interventions/Progress Updates:      Therapy Documentation Precautions:  Precautions Precautions: None, Back Precaution Booklet Issued: Yes (comment) Recall of Precautions/Restrictions: Intact Precaution/Restrictions Comments: watch BP (orthostatic) Required Braces or Orthoses: Spinal Brace Spinal Brace: Thoracolumbosacral orthotic, Applied in sitting position Restrictions Weight Bearing Restrictions Per Provider Order: No Session 1 General: Pt supine in bed upon OT arrival, agreeable to OT session. IV in place.  Pain:  5/10 pain reported in RLE, activity, intermittent rest breaks, distractions provided for pain management, pt reports tolerable to proceed.   ADL: OT providing skilled intervention on ADL retraining in order to increase independence with tasks and increase activity tolerance. Pt completed the following tasks at the current level of assist: Bed mobility: SBA supine>EOB with bed rails Grooming/oral hygiene: SBA seated at sink to brush partials, teeth, shave and mouth wash. Pt able to reach to back of sink to get items UB dressing: SBA seated EOB fro overhead shirt and TLSO Footwear: total A to don socks Transfers: CGA bed>W/C with RW, no LOB/SOB, reported dizziness upon standing  Exercises: Pt completed 10 minutes of arm bike on hills mode in order to increase BUE/BLEstrength and endurance in  preparation for increased independence in ADLs such as functional mobility and transfers. No rest break required, resistance load between 4-10 depending on ascending/descending hill.    Pt seated in W/C at end of session with call light within reach and 4Ps assessed.    Session 2 General:  Pt supine in bed upon OT arrival, agreeable to OT session. Pt able to don TLSO seated EOB at SBA. Pt propelling self to therapy gym with occasional rest breaks for UE. IV in place.  Pain:5/10 pain reported in RLE, activity, intermittent rest breaks, distractions provided for pain management, pt reports tolerable to proceed.   Balance: Pt completed a variety of seated balance activities in order to promote increased balance strategies with ADL participation. Pt completed all activities at seated level with intermittent unsupported UE. Pt completed various dynamic and static balance activities on BITS balance system with VC for directions. Pt able to demonstrate fair+ balance within back precautions.   Pt supine in bed with bed alarm activated, 2 bed rails up, call light within reach and 4Ps assessed. Pt wife and sister present.    Therapy/Group: Individual Therapy Camie Hoe, OTD, OTR/L 09/23/2024, 4:21 PM

## 2024-09-23 NOTE — Progress Notes (Signed)
 Physical Therapy Session Note  Patient Details  Name: David King MRN: [id number] Date of Birth: 07/14/1956  Today's Date: 09/23/2024 PT Individual Time: 1015-1115 PT Individual Time Calculation (min): 60 min   Short Term Goals: Week 1:  PT Short Term Goal 1 (Week 1): Pt will perform w/c transfers with CGA PT Short Term Goal 2 (Week 1): Pt will amb x25' with RW CGA without rest PT Short Term Goal 3 (Week 1): Pt will perform supine<->sit transfers with CGA Week 2:     Skilled Therapeutic Interventions/Progress Updates:    Pt in w/c when PT arrived, pt agreeable for session.  Pt wheeled to main gym with supervision/CGA for directional assist. Pt amb x49' x2 helpers for w/c and IV pole follow.  Pt demonstrates slow cadence, with improved RLE hip flexion to clear right drop foot. Pt fatigued post activity and requires extended rest break for recovery. Nsg joined for med pass. Standing RW heel raises x10, rest. Pt fatigued and needed to rest. Pt wheeled self back to room, transferred into bed with Supervision. Surgeon entered to speak with pt. Pt's sister present in room.     Therapy Documentation Precautions:  Precautions Precautions: None, Back Precaution Booklet Issued: Yes (comment) Recall of Precautions/Restrictions: Intact Precaution/Restrictions Comments: watch BP (orthostatic) Required Braces or Orthoses: Spinal Brace Spinal Brace: Thoracolumbosacral orthotic, Applied in sitting position Restrictions Weight Bearing Restrictions Per Provider Order: No  Pain: Pain Assessment Pain Scale: 0-10 Pain Score: 4  Pain Location: Generalized Pain Intervention(s): Medication (See eMAR) Multiple Pain Sites: No     Therapy/Group: Individual Therapy  [name] Fast 09/23/2024, 2:12 PM

## 2024-09-23 NOTE — Progress Notes (Signed)
 Patient again evaluated at his bedside, accompanied by his sister.  His symptoms are unchanged.  His wound is healing well, with no signs of erythema or drainage.  He continues to report resolution of his preoperative left leg pain, with ongoing hypersensitivity, pain, and weakness involving the right leg.  As previously documented, his lumbar MRI appears pristine, and his CAT scan does reveal excellent positioning of his hardware, and intervertebral spacer.  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  Current plan is for him to proceed to surgery tomorrow for bypass procedure, as previously documented.  He does understand that his care in that regard will be overseen by the vascular team, and that I will continue to follow him throughout that recovery.

## 2024-09-23 NOTE — Progress Notes (Signed)
  Progress Note    09/23/2024 7:39 AM 2 Days Post-Op  Subjective: Pain in the right leg is the same  Vitals:   09/22/24 2035 09/23/24 0422  BP: 123/70 129/73  Pulse: 89 78  Resp: 18 18  Temp: 99 F (37.2 C) 99 F (37.2 C)  SpO2: 94% 95%    Physical Exam: Awake alert and oriented Nonlabored respirations 2+ palpable right radial pulse Right lower extremity was not examined secondary to pain does not appear grossly ischemic  CBC    Component Value Date/Time   WBC 9.1 09/23/2024 0507   RBC 3.30 (L) 09/23/2024 0507   HGB 10.8 (L) 09/23/2024 0507   HCT 32.4 (L) 09/23/2024 0507   PLT 164 09/23/2024 0507   MCV 98.2 09/23/2024 0507   MCH 32.7 09/23/2024 0507   MCHC 33.3 09/23/2024 0507   RDW 13.2 09/23/2024 0507   LYMPHSABS 1.1 09/23/2024 0507   MONOABS 0.8 09/23/2024 0507   EOSABS 0.1 09/23/2024 0507   BASOSABS 0.0 09/23/2024 0507    BMET    Component Value Date/Time   NA 131 (L) 09/22/2024 0551   K 3.6 09/22/2024 0551   CL 97 (L) 09/22/2024 0551   CO2 24 09/22/2024 0551   GLUCOSE 104 (H) 09/22/2024 0551   BUN 14 09/22/2024 0551   CREATININE 0.88 09/22/2024 0551   CREATININE 1.06 10/10/2020 0915   CALCIUM  8.5 (L) 09/22/2024 0551   GFRNONAA >60 09/22/2024 0551   GFRNONAA 73 10/10/2020 0915   GFRAA 84 10/10/2020 0915    INR    Component Value Date/Time   INR 1.2 08/22/2022 0833     Intake/Output Summary (Last 24 hours) at 09/23/2024 0739 Last data filed at 09/23/2024 0135 Gross per 24 hour  Intake 680 ml  Output 2500 ml  Net -1820 ml     Assessment/plan: 68 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  Left leg remains asymptomatic although I discussed with the patient that I assume he would be more symptomatic as his activity level increases.  Right lower extremity with continued pain from the knee down does not appear grossly ischemic but did have elevated CK the other day.  I will recheck a  CK level tomorrow.  CT results discussed with the patient.  Will plan for right axillary to bifemoral bypass tomorrow with possible right femoral to popliteal artery bypass pending exposure of the muscle.  We again discussed that his pain is likely multifactorial with likely a significant component secondary to ischemia although his leg does not appear grossly ischemic.  He is likely at risk for amputation pending outcomes of tomorrow surgery.  He is demonstrates very good understanding of our conversation and is amenable to proceed.   Taige Housman C. Sheree, MD [facility] Office: [phone] Pager: [phone]  09/23/2024 7:39 AM

## 2024-09-23 NOTE — Progress Notes (Signed)
 PROGRESS NOTE   Subjective/Complaints: Pt reports on Heparin  gtt-  Hurting really badly this AM- last got pain meds at 6am, but hasn't gotten long acting pain meds yet.   [place] has a blister on R foot/ankle LBM 3-4 days ago- feels constipated- had Sorbitol  yesterday- no results.     ROS: Per HPI   Pt denies SOB, abd pain, CP, N/V/ (+)C/D, and vision changes   (+) RLE uncontrolled pain- slightly improved with Oxycontin   Objective:   CT Angio Chest/Abd/Pel for Dissection W and/or W/WO Result Date: 09/22/2024 CLINICAL DATA:  Aortic aneurysm suspected EXAM: CT ANGIOGRAPHY CHEST, ABDOMEN AND PELVIS TECHNIQUE: Non-contrast CT of the chest was initially obtained. Multidetector CT imaging through the chest, abdomen and pelvis was performed using the standard protocol during bolus administration of intravenous contrast. Multiplanar reconstructed images and MIPs were obtained and reviewed to evaluate the vascular anatomy. RADIATION DOSE REDUCTION: This exam was performed according to the departmental dose-optimization program which includes automated exposure control, adjustment of the mA and/or kV according to patient size and/or use of iterative reconstruction technique. CONTRAST:  75mL OMNIPAQUE  IOHEXOL  350 MG/ML SOLN COMPARISON:  Chest CT February 25, 2024 CT abdomen November 19, 2022 FINDINGS: CTA CHEST FINDINGS Cardiovascular: The heart size is normal. Ascending aorta measures 3.5 cm. Pulmonary artery is normal. Atherosclerotic calcifications of coronary arteries and aorta. No pericardial fluid. Mediastinum/Nodes: No suspicious lymphadenopathy. Hiatal hernia. Normal thyroid. Lungs/Pleura: Upper lobe predominant centrilobular emphysematous changes. Lower lobe subpleural fibrotic changes and interlobular septal thickening. 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 Partially calcified left upper lobe  micronodule (7/17), stable to prior. No new nodules. Peri osteophyte fibrosis in posteromedial right lower lobe. (7/85). No pleural effusion. Musculoskeletal: Degenerative changes of the spine. Review of the MIP images confirms the above findings. CTA ABDOMEN AND PELVIS FINDINGS VASCULAR Aorta: Severe atherosclerotic calcifications. Infrarenal aneurysmal dilation of aorta measuring 3.2 x 3.7 x 6.8 cm with mural thrombus, stable to prior. Celiac: Patent without evidence of aneurysm, dissection, vasculitis or significant stenosis. SMA: Patent without evidence of aneurysm, dissection, vasculitis or significant stenosis. Renals: Both renal arteries are patent without evidence of aneurysm, dissection, vasculitis, fibromuscular dysplasia or significant stenosis. IMA: Patent without evidence of aneurysm, dissection, vasculitis or significant stenosis.Aorto bi-iliac stent graft identified. Inflow: Patent without evidence of dissection, vasculitis or significant stenosis. Veins: No obvious venous abnormality within the limitations of this arterial phase study. Review of the MIP images confirms the above findings. NON-VASCULAR Hepatobiliary: Nodular liver contour. Mild hepatic steatosis. No focal liver lesion. Gallbladder is unremarkable. No biliary dilatation. Pancreas: Unremarkable. No pancreatic ductal dilatation or surrounding inflammatory changes. Spleen: Scattered calcifications of the spleen suggestive of prior granulomatous disease. Adrenals/Urinary Tract: Multiple bilateral renal cortical nonenhancing lesions most consistent with simple cysts which does not require imaging follow-up. Few of the renal cortical lesions demonstrate calcification versus enhancing component in right inferolateral cortex (5/124) Nonobstructive nephrolithiasis measuring 1.7 cm in left kidney interpolar region, stable. Previously seen left renal pelvis nonobstructive nephrolithiasis is resolved. Bladder is decompressed and contains Foley  catheter balloon and foci of air likely due to instrumentation. Stomach/Bowel: Diverticulosis without diverticulitis. Stomach is unremarkable. No bowel obstruction.  Normal appendix. Lymphatic: No suspicious lymphadenopathy. Reproductive: Status post prostatectomy. Other: No abdominal wall hernia or abnormality. No abdominopelvic ascites. Musculoskeletal: No fracture is seen. Spinal fusion device in lower lumbar spine. Status post vertebroplasty of T7 vertebral body. Review of the MIP images confirms the above findings. IMPRESSION: Stable infrarenal aortic aneurysm.  Aorto bi-iliac stent graft. Cirrhotic liver morphology.  No focal liver lesion identified. 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 Left apical partially calcified micro nodules, stable to prior. No new nodules. No imaging follow-up required in a low risk patient. Prostatectomy without suspicious finding at the surgical bed. Electronically Signed   By: Tricia [place]  Zare M.D.   On: 09/22/2024 18:54   PERIPHERAL VASCULAR CATHETERIZATION Result Date: 09/21/2024 Images from the original result were not included. Patient name: David King MRN: [id number] DOB: 09-30-1956 Sex: male 09/21/2024 Pre-operative Diagnosis: Right leg ischemia Post-operative diagnosis:  Same Surgeon:  [name] Procedure Performed:  1.  Ultrasound-guided access, left common femoral artery  2.  Selective injection with catheter in the common iliac artery  3.  Ultrasound-guided access, left brachial artery  4.  Catheter placement into abdominal aorta  5.  Abdominal aortogram  6.  Bilateral leg angiogram  7.  Conscious sedation, 15 minutes Indications: This is a 68 year old gentleman with history of vascular surgery with endarterectomy and iliac stenting and back surgery from a anterior and posterior approach.  He has been complaining of right leg pain.  He is here for further evaluation Procedure:  The patient was identified in the holding area  and taken to room 8.  The patient was then placed supine on the table and prepped and draped in the usual sterile fashion.  A time out was called.  Conscious sedation was administered with the use of IV fentanyl  and Versed  under continuous physician and nurse monitoring.  Heart rate, blood pressure, and oxygen  saturation were continuously monitored.  Total sedation time was 50 minutes.  Ultrasound was used to evaluate the left common femoral artery.  It was patent, however there did appear to be thrombus within the common femoral artery.  A digital ultrasound image was acquired.  A micropuncture needle was used to access the left common femoral artery under ultrasound guidance.  An 018 wire was advanced without resistance and a micropuncture sheath was placed.  I then inserted a Bentson wire but met resistance.  I removed the micropuncture sheath and used a Berenstein 2 catheter.  I was confident the wire was in the iliac stents on the left but was concerned that I may be in a dissection within the aorta.  I shot a contrast injection that showed that there was opacification of his stents.  I then shot an aortogram to look like I was in a dissection.  Because the patient did not have palpable femoral pulses I elected to stop.  I held pressure for 10 minutes. Attention was turned towards the left arm.  The left brachial artery was evaluated with ultrasound which was widely patent.  1% lidocaine  was used local anesthesia.  The left brachial artery was then cannulated under ultrasound guidance with a micropuncture needle.  An 018 wire was inserted followed by placement of micropuncture sheath.  I then placed a 5 French sheath over a Bentson wire.  Heparin  and nitroglycerin  were given through the sheath.  I then advanced a pigtail catheter into the abdominal aorta and aortogram with bilateral runoff was performed.  Aortogram: The suprarenal abdominal aorta  is patent.  The visualized portions of the superior mesenteric  artery and bilateral renal arteries are widely patent.  There is occlusion of the infrarenal abdominal aorta at the level of the renal arteries.  There does appear to be reconstitution of the common femoral arteries bilaterally.  Right Lower Extremity: The right common femoral profundofemoral artery appeared to be patent.  The superficial femoral artery is patent down to the adductor canal where it occludes.  Distal evaluation was not possible given proximal obstruction  Left Lower Extremity: The left common femoral profundofemoral artery appeared to be patent.  The superficial femoral artery is patent down to the adductor canal where it occludes.  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 [name], M.D., FACS [facility] Office: [phone] Pager:  [phone]    Recent Labs    09/23/24 0507  WBC 9.1  HGB 10.8*  HCT 32.4*  PLT 164   Recent Labs    09/22/24 0551  NA 131*  K 3.6  CL 97*  CO2 24  GLUCOSE 104*  BUN 14  CREATININE 0.88  CALCIUM  8.5*    Intake/Output Summary (Last 24 hours) at 09/23/2024 0819 Last data filed at 09/23/2024 0135 Gross per 24 hour  Intake 440 ml  Output 2500 ml  Net -2060 ml     Wound 09/15/24 2032 Pressure Injury Buttocks Bilateral;Right Deep Tissue Pressure Injury - Purple or maroon localized area of discolored intact skin or blood-filled blister due to damage of underlying soft tissue from pressure and/or shear. (Active)     Wound 09/15/24 1848 Pressure Injury Heel Right Deep Tissue Pressure Injury - Purple or maroon localized area of discolored intact skin or blood-filled blister due to damage of underlying soft tissue from pressure and/or shear. (Active)    Physical Exam: Vital Signs Blood pressure 129/73, pulse  78, temperature 99 F (37.2 C), temperature source Oral, resp. rate 18, height 5' 9 (1.753 m), weight 83.2 kg, SpO2 95%.        General: awake, alert, appropriate, supine in bed; in dark; NAD HENT: conjugate gaze; oropharynx moist CV: regular rate and rhythm; no JVD Pulmonary: CTA B/L; no W/R/R- good air movement GI: soft, NT, somewhat distended; hypoactive BS Psychiatric: depressed affect Neurological: Ox3 MSK- barely touched R ankle/foot and pt was screaming-  Extremities: legs more pale than expected- no pulses- 2-3+ RLE edema to distal calf Skin- has deep indentation on R ankle from sock as well as a long horizontal blister that's thin likely from sock-  Neurological:     Comments: Patient is alert oriented x 4 following commands. Right lower extremity strength is 4/5 proximally and 2/5 distally, LLE 4/5 throughout, decreased sensation in right foot  Prior neuro assessment is c/w today's exam 09/23/2024.  9/17 image   Image 9/21   Assessment/Plan: 1. Functional deficits which require 3+ hours per day of interdisciplinary therapy in a comprehensive inpatient rehab setting. Physiatrist is providing close team supervision and 24 hour management of active medical problems listed below. Physiatrist and rehab team continue to assess barriers to discharge/monitor patient progress toward functional and medical goals  Care Tool:  Bathing    Body parts bathed by patient: Right arm, Left arm, Chest, Abdomen, Front perineal area, Right upper leg, Left upper leg, Face   Body  parts bathed by helper: Buttocks, Left lower leg, Right lower leg     Bathing assist Assist Level: Minimal Assistance - Patient > 75%     Upper Body Dressing/Undressing Upper body dressing        Upper body assist Assist Level: Set up assist    Lower Body Dressing/Undressing Lower body dressing            Lower body assist Assist for lower body dressing: Moderate Assistance - Patient 50 - 74%      Toileting Toileting Toileting Activity did not occur Press photographer and hygiene only): Refused  Toileting assist Assist for toileting: Moderate Assistance - Patient 50 - 74%     Transfers Chair/bed transfer  Transfers assist  Chair/bed transfer activity did not occur: Safety/medical concerns  Chair/bed transfer assist level: Contact Guard/Touching assist     Locomotion Ambulation   Ambulation assist      Assist level: 2 helpers Assistive device: Gillie Max distance: 25'   Walk 10 feet activity   Assist     Assist level: 2 helpers Assistive device: Walker-Eva   Walk 50 feet activity   Assist Walk 50 feet with 2 turns activity did not occur: Safety/medical concerns (fatigue, unsafe)         Walk 150 feet activity   Assist Walk 150 feet activity did not occur: Safety/medical concerns (fatigue, unsafe)         Walk 10 feet on uneven surface  activity   Assist Walk 10 feet on uneven surfaces activity did not occur: Safety/medical concerns (fatigue, unsafe)         Wheelchair     Assist Is the patient using a wheelchair?: Yes Type of Wheelchair: Manual    Wheelchair assist level: Supervision/Verbal cueing Max wheelchair distance: 230'    Wheelchair 50 feet with 2 turns activity    Assist        Assist Level: Supervision/Verbal cueing   Wheelchair 150 feet activity     Assist      Assist Level: Supervision/Verbal cueing   Blood pressure 129/73, pulse 78, temperature 99 F (37.2 C), temperature source Oral, resp. rate 18, height 5' 9 (1.753 m), weight 83.2 kg, SpO2 95%.  Medical Problem List and Plan: 1. 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  Status post L4-5 transforaminal interbody fusion/posterior lateral fusion insertion interbody device L4-5 decompression 09/08/2024 per Dr. Beuford.  Back brace when out of bed             -patient may shower              -ELOS/Goals: 10-14 days              Con't CIR PT and OT as tolerated  Acute surgery and going to acute tomorrow with vascular as below.  2.  Antithrombotics: -DVT/anticoagulation:  Mechanical: Antiembolism stockings, thigh (TED hose) Bilateral lower extremities.  Check vascular study 9/23- might be put on Heparin  gtt today- so wait to start Lovenox  9/25- on Heparin  gtt per Vascular/ordered- pharmacy managing             -antiplatelet therapy: N/A 3. Pain Management: Neurontin  600 mg 3 times daily, Robaxin  and oxycodone  as needed  9/18- Gave a 1x dose of Oxy 15 mg, Increased his Oxy to 10-15 mg q4 hours prn for pain; changed Robaxin  to 1000 mg QID, increased Gabapentin  to 900 mg TID.Also added Lidoderm   for nerve pain on RLE.    -9/19  patient reports continued pain in his leg, improved with getting up.  Does not appear to be getting oxycodone  frequently today.  Continue current regimen and monitor.  Appears more comfortable today compared to description yesterday  9/20 will change gabapentin  to Lyrica  3 times daily 150 mg for neuropathic pain  9/21  lidocaine  patch R skin, occasional use of oxycodone    9/22- will start Oxycontin  10 mg BID for the next 1 week to see if that will help pain.   9/23- pain slightly better, but still a major issue  9/24- pt reports pain is barely tolerable but that' sbetter than when we started- d/w pt  9/25- pain is  stable, barely tolerable.  4. Anxiety: would benefit from neuropsych eval, magnesium  supplement started.  Ambien  as needed  9/18- Will start Duloxetine  for nerve pain 30 mg at bedtime  9/19 may take several days for duloxetine  to take effect  9/24- will add Celexa - I know he's on Duloxetine  for nerve pain, but not as good of SNRI for mood- so will combo them- also obviously he's having grief reaction ot possibly losing RLE             -antipsychotic agents: N/A 5. Neuropsych/cognition: This patient is capable of making decisions on his own  behalf. 6. Tachycardia: magnesium  supplement started   -9/20-21 HR in the 80s and 90s continue to monitor  9/24- doing well    09/23/2024    4:22 AM 09/22/2024    8:35 PM 09/22/2024    2:17 PM  Vitals with BMI  Systolic 129 123 881  Diastolic 73 70 59  Pulse 78 89 102    7. Fluids/Electrolytes/Nutrition: Routine in and outs with follow-up chemistries.   8.  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  Patient responded to epi drip and Decadron .   9.  Hyperlipidemia.  Pravachol    10.  COPD.  Remote tobacco use.  Continue inhalers as directed.  Check oxygen  saturations every shift   11.  History of prostate cancer.  Status post prostatectomy.  Continue Flomax  0.4 mg daily.   12.  History of detached retina left eye.  Follow-up outpatient ophthalmology   13. Transaminitis  9/18- AST 125 and ALT 161- will stop Pravachol  20 mg daily and decrease Tylenol  to 325 mg q6 hours prn from 650 mg. Last LFTs were normal 7-8 days ago.   9/23- will restart Pravachol  at lower dose- 10 mg daily and recheck labs in AM  9/24- AST 147 and ALT down to 87- so AST went up and ALT went down some- could be lack of blood flow? 14. Azotemia  9/18- BUN up to 26 from 12- will push fluids and recheck in AM.   9/19 BUN decreased to 20, continue encourage oral fluids, recheck tomorrow  9/20 BUN and creatinine improved today, continue current  9/22- Cr 0.87 and BUN 18- doing better  9/25- Cr 0.88 and BUN 14- doing better 15. Mild Leukocytosis  9/18- will recheck in AM- has had decadron - no Sx's of UTI/URI.   9/19 WBC a little higher today, may be due to recent Decadron , no signs of infection  9/20 no signs infection noted continue to monitor  9/22- No signs of infection- WBC stable   9/25- WBC down to 9.1k 16. R heel DTI/lack of blood flow to RLE  9/18- Prevalon ordered and pt has WOC consult ordered  Patient was seen by wound VAC for deep pressure injury, appreciate assistance and  recommendations  9/21 also has some brusing on R toe, appears similar/ a little improve to prior  image. RLE edema VAS US  neg couple days ago . Spoke with vascular, will order ABI and VAS U/S R, appreciate assistance  9/22- Arterial duplex ordered for RLE this weekend- is pending- ordered ASA 81 mg per vascular- cannot order Statin because pt's LFTs significantly elevated- will try to restart if LFT's have improved this week  9/23- restarted low dose Pravachol   -CTA today- Vascular involved  9/24- is blocked from mid aorta down per vascular- and needs surgery to fix- is leaving for Bifemoral aorto bypass with fem-pop on RLE-getting CT today per vascular and putting on Heparin  gtt 17. Loose Bms/constipation  -hold miralax   -9/21 LBM yesterday, monitor   9/23- having tiny Bms per pt- will order Sorbitol  today at 1pm- if has CTA by then- if not, will give sorbitol  tomorrow  9/25- got sorbitol  yesterday- no results- will give Mg citrate 1/2 bottle at 11am and if no results, other 1/2 at 4pm 18. Hyponatremia  -9/21 130 today, add salt tabs  9/22- Na 132 19. Urinary retention  9/22- started Flomax  last week, but changed to q supper- also will replace Foley- he's having a lot of pain with In/out caths and asked for foley to be replaced.   9/23- foley back in-  20. R ankle blister  9/25- took sock off- don't want to place TEDs opr ACE wraps due to lack of blood flow-   I spent a total of  44  minutes on total care today- >50% coordination of care- due to  D/w pt about pain, blister, bowels; and we discussed surgery and his concerns about losing his leg. Started Celexa  yesterday to help with grief reaction.   LOS: 8 days A FACE TO FACE EVALUATION WAS PERFORMED  Nameer Summer 09/23/2024, 8:19 AM

## 2024-09-23 NOTE — Progress Notes (Signed)
 PHARMACY - ANTICOAGULATION CONSULT NOTE  Pharmacy Consult for Heparin  Indication:  arterial insufficiency / occlusion  Allergies  Allergen Reactions   Ancef  [Cefazolin ] Anaphylaxis    Patient Measurements: Height: 5' 9 (175.3 cm) Weight: 83.2 kg (183 lb 6.8 oz) IBW/kg (Calculated) : 70.7 HEPARIN  DW (KG): 83.2  Vital Signs: Temp: 99 F (37.2 C) (09/25 0422) Temp Source: Oral (09/25 0422) BP: 129/73 (09/25 0422) Pulse Rate: 78 (09/25 0422)  Labs: Recent Labs    09/21/24 0920 09/22/24 0551 09/22/24 1741 09/23/24 0507  HGB  --   --   --  10.8*  HCT  --   --   --  32.4*  PLT  --   --   --  164  HEPARINUNFRC  --   --  0.12* 0.28*  CREATININE  --  0.88  --   --   CKTOTAL 3,886*  --   --   --     Estimated Creatinine Clearance: 78.1 mL/min (by C-G formula based on SCr of 0.88 mg/dL).  Assessment: 66 YOM admitted w/ severe vascular insufficiency who is tentatively scheduled for right axillary bifemoral bypass w/ possible right common femoral to popliteal bypass. No PTA AC noted through a search of his EMR. Of note, he had recent lumbar fusion on 09/08/2024. Consult for heparin  therapy received 09/22/24/   9/24 RPh visited patient at bedside. He has a bruise that encompasses the entire backside of his left hand from a blood draw this morning. Patient stated the bruise developed to that size within 20 minutes of draw. I will target a lower goal for heparin  and omit the load dose given recent surgery and the above observation.   PM update: no further issues with bleeding or worsening of bruise per RN. No interruptions or issues with heparin  infusion noted.  9/25 am: Heparin  level just below target range (0.28) on 1500 units/hr. CBC has trended down from prior values.  No bleeding noted; RN reports left hand bruise as noted 9/24.  Goal of Therapy:  Heparin  level 0.3-0.5 units/ml Monitor platelets by anticoagulation protocol: Yes   Plan:  Conservative increase of heparin  from  1500 to 1550 units/hr Heparin  level ~8 hrs after rate change. Daily heparin  level and CBC. For vascular procedure 9/26 am. Expect heparin  to be held on call to OR.  Genaro Zebedee Calin, RPh 09/23/2024,8:59 AM

## 2024-09-23 NOTE — Progress Notes (Signed)
 Inpatient Rehabilitation Discharge Medication Review by a Pharmacist  A complete drug regimen review was completed for this patient to identify any potential clinically significant medication issues.  High Risk Drug Classes Is patient taking? Indication by Medication  Antipsychotic No   Anticoagulant Yes IV heparin  - arterial insufficiency/occlusion  Antibiotic No   Opioid Yes OxyContin  - pain control PRN Oxycodone  - moderate or severe pain  Antiplatelet Yes Aspirin  81 mg - PVD  Hypoglycemics/insulin  No   Vasoactive Medication Yes Tamsulosin  - urinary retention  Chemotherapy No   Other Yes Citalopram  - mood stabilization Duloxetine  - nerve pain Lidocaine  patch - topical pain relief Methocarbamol  - muscle relaxer Pregabalin  - neuropathic pain Rosuvastatin  - hyperlipidemia Sodium chloride  tablets - hypernatremia Vitamin C , Vitamin D, Magonate, Zinc  - supplements Docusate - laxative  PRNs:  Acetaminophen  - mild pain or fever >100.5 Albuterol  nebs - wheezing or shortness of breath Ondansetron  - nausea/vomiting Senna-docusate, Sorbital - constipation Zolpidem  - sleep     Type of Medication Issue Identified Description of Issue Recommendation(s)  Drug Interaction(s) (clinically significant)     Duplicate Therapy  Pravastatin  is on discharge list but not on active med list. Changed to Rosuvastatin  on 09/22/24. - CK elevated on 9/23 and ALT/AST mildly elevated. Rechecking CK on 9/26. Discontinue Pravastatin .   If CK not trending down on 9/26, may need to consider holding statin until CK normalizes.  Recheck AST/ALT within the next week.   Allergy      No Medication Administration End Date     Incorrect Dose     Additional Drug Therapy Needed     Significant med changes from prior encounter (inform family/care partners about these prior to discharge). Gabapentin  changed to Pregabalin . Pravastatin  changed to Rosuvastatin .  Also new: Citalopram , Duloxetine ,  Lidocaine  patch,  methocarbamol , NaCl tabs, Tamsulosin , scheduled OxyContin , PRN Oxycodone , PRN Zolpidem , supplements, laxatives.  Off prior Trelegy, Atrovent  nasal spray, Vitamin D and PRN Tylenol  PM. Communicate changes with patient/family prior to discharge.        Consider resuming Trelegy, Atrovent  nasal spray and Vitamin D if clinically warranted.   Would not resume PRN Tylenol  PM > now on PRN Zolpidem .   Other  Albuterol  inhaler changed to nebs per inpatient substitution. Change back to inhaler at hospital discharge.    Clinically significant medication issues were identified that warrant physician communication and completion of prescribed/recommended actions by midnight of the next day:  No  Name of provider notified for urgent issues identified:   Provider Method of Notification:   Pharmacist comments:   - Two statins on discharge list. Changed from pravastatin  to rosuvastatin  on 09/22/24.  Will need to cancel pravastatin  at hospital discharge. Going from CIR to inpatient unit on 9/26 for Vascular Surgery and currently on rosuvastatin  alone. - CK elevated on 9/23 and ALT/AST mildly elevated. Rechecking CK on 9/26. If LFTs not trending down, may need to consider holding statin until LFTs normalize.  Time spent performing this drug regimen review (minutes):  25 minutes  Genaro Zebedee Calin, [place] 09/23/2024 3:49 PM

## 2024-09-23 NOTE — H&P (View-Only) (Signed)
  Progress Note    09/23/2024 7:39 AM 2 Days Post-Op  Subjective: Pain in the right leg is the same  Vitals:   09/22/24 2035 09/23/24 0422  BP: 123/70 129/73  Pulse: 89 78  Resp: 18 18  Temp: 99 F (37.2 C) 99 F (37.2 C)  SpO2: 94% 95%    Physical Exam: Awake alert and oriented Nonlabored respirations 2+ palpable right radial pulse Right lower extremity was not examined secondary to pain does not appear grossly ischemic  CBC    Component Value Date/Time   WBC 9.1 09/23/2024 0507   RBC 3.30 (L) 09/23/2024 0507   HGB 10.8 (L) 09/23/2024 0507   HCT 32.4 (L) 09/23/2024 0507   PLT 164 09/23/2024 0507   MCV 98.2 09/23/2024 0507   MCH 32.7 09/23/2024 0507   MCHC 33.3 09/23/2024 0507   RDW 13.2 09/23/2024 0507   LYMPHSABS 1.1 09/23/2024 0507   MONOABS 0.8 09/23/2024 0507   EOSABS 0.1 09/23/2024 0507   BASOSABS 0.0 09/23/2024 0507    BMET    Component Value Date/Time   NA 131 (L) 09/22/2024 0551   K 3.6 09/22/2024 0551   CL 97 (L) 09/22/2024 0551   CO2 24 09/22/2024 0551   GLUCOSE 104 (H) 09/22/2024 0551   BUN 14 09/22/2024 0551   CREATININE 0.88 09/22/2024 0551   CREATININE 1.06 10/10/2020 0915   CALCIUM  8.5 (L) 09/22/2024 0551   GFRNONAA >60 09/22/2024 0551   GFRNONAA 73 10/10/2020 0915   GFRAA 84 10/10/2020 0915    INR    Component Value Date/Time   INR 1.2 08/22/2022 0833     Intake/Output Summary (Last 24 hours) at 09/23/2024 0739 Last data filed at 09/23/2024 0135 Gross per 24 hour  Intake 680 ml  Output 2500 ml  Net -1820 ml     Assessment/plan: 68 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  Left leg remains asymptomatic although I discussed with the patient that I assume he would be more symptomatic as his activity level increases.  Right lower extremity with continued pain from the knee down does not appear grossly ischemic but did have elevated CK the other day.  I will recheck a  CK level tomorrow.  CT results discussed with the patient.  Will plan for right axillary to bifemoral bypass tomorrow with possible right femoral to popliteal artery bypass pending exposure of the muscle.  We again discussed that his pain is likely multifactorial with likely a significant component secondary to ischemia although his leg does not appear grossly ischemic.  He is likely at risk for amputation pending outcomes of tomorrow surgery.  He is demonstrates very good understanding of our conversation and is amenable to proceed.   Taige Housman C. Sheree, MD [facility] Office: [phone] Pager: [phone]  09/23/2024 7:39 AM

## 2024-09-24 ENCOUNTER — Inpatient Hospital Stay (HOSPITAL_COMMUNITY)

## 2024-09-24 ENCOUNTER — Encounter (HOSPITAL_COMMUNITY): Payer: Self-pay | Admitting: Physical Medicine and Rehabilitation

## 2024-09-24 ENCOUNTER — Inpatient Hospital Stay (HOSPITAL_COMMUNITY): Admitting: Registered Nurse

## 2024-09-24 ENCOUNTER — Inpatient Hospital Stay (HOSPITAL_COMMUNITY)
Admission: RE | Admit: 2024-09-24 | Discharge: 2024-10-11 | DRG: 252 | Disposition: A | Discharge status: Rehabilitation Facility | Source: Other Acute Inpatient Hospital | Attending: Internal Medicine | Admitting: Internal Medicine

## 2024-09-24 ENCOUNTER — Encounter (HOSPITAL_COMMUNITY)
Admission: RE | Disposition: A | Discharge status: Rehabilitation Facility | Payer: Self-pay | Source: Other Acute Inpatient Hospital | Attending: Internal Medicine

## 2024-09-24 ENCOUNTER — Other Ambulatory Visit: Payer: Self-pay

## 2024-09-24 DIAGNOSIS — I959 Hypotension, unspecified: Secondary | ICD-10-CM | POA: Diagnosis not present

## 2024-09-24 DIAGNOSIS — J189 Pneumonia, unspecified organism: Secondary | ICD-10-CM | POA: Diagnosis not present

## 2024-09-24 DIAGNOSIS — M47816 Spondylosis without myelopathy or radiculopathy, lumbar region: Secondary | ICD-10-CM | POA: Diagnosis not present

## 2024-09-24 DIAGNOSIS — D72828 Other elevated white blood cell count: Secondary | ICD-10-CM | POA: Diagnosis present

## 2024-09-24 DIAGNOSIS — J449 Chronic obstructive pulmonary disease, unspecified: Secondary | ICD-10-CM | POA: Diagnosis not present

## 2024-09-24 DIAGNOSIS — I10 Essential (primary) hypertension: Secondary | ICD-10-CM

## 2024-09-24 DIAGNOSIS — L89616 Pressure-induced deep tissue damage of right heel: Secondary | ICD-10-CM | POA: Diagnosis present

## 2024-09-24 DIAGNOSIS — K5900 Constipation, unspecified: Secondary | ICD-10-CM | POA: Diagnosis present

## 2024-09-24 DIAGNOSIS — K5901 Slow transit constipation: Secondary | ICD-10-CM | POA: Diagnosis not present

## 2024-09-24 DIAGNOSIS — R339 Retention of urine, unspecified: Secondary | ICD-10-CM | POA: Diagnosis present

## 2024-09-24 DIAGNOSIS — Z87891 Personal history of nicotine dependence: Secondary | ICD-10-CM | POA: Diagnosis not present

## 2024-09-24 DIAGNOSIS — N179 Acute kidney failure, unspecified: Secondary | ICD-10-CM | POA: Diagnosis present

## 2024-09-24 DIAGNOSIS — I70261 Atherosclerosis of native arteries of extremities with gangrene, right leg: Secondary | ICD-10-CM | POA: Diagnosis present

## 2024-09-24 DIAGNOSIS — I771 Stricture of artery: Secondary | ICD-10-CM | POA: Diagnosis not present

## 2024-09-24 DIAGNOSIS — L89311 Pressure ulcer of right buttock, stage 1: Secondary | ICD-10-CM | POA: Diagnosis present

## 2024-09-24 DIAGNOSIS — Z8546 Personal history of malignant neoplasm of prostate: Secondary | ICD-10-CM | POA: Diagnosis not present

## 2024-09-24 DIAGNOSIS — T819XXA Unspecified complication of procedure, initial encounter: Secondary | ICD-10-CM | POA: Diagnosis not present

## 2024-09-24 DIAGNOSIS — F32A Depression, unspecified: Secondary | ICD-10-CM | POA: Diagnosis present

## 2024-09-24 DIAGNOSIS — I9789 Other postprocedural complications and disorders of the circulatory system, not elsewhere classified: Secondary | ICD-10-CM

## 2024-09-24 DIAGNOSIS — I743 Embolism and thrombosis of arteries of the lower extremities: Secondary | ICD-10-CM | POA: Diagnosis not present

## 2024-09-24 DIAGNOSIS — J42 Unspecified chronic bronchitis: Secondary | ICD-10-CM | POA: Diagnosis not present

## 2024-09-24 DIAGNOSIS — K219 Gastro-esophageal reflux disease without esophagitis: Secondary | ICD-10-CM | POA: Diagnosis present

## 2024-09-24 DIAGNOSIS — L89321 Pressure ulcer of left buttock, stage 1: Secondary | ICD-10-CM | POA: Diagnosis present

## 2024-09-24 DIAGNOSIS — E785 Hyperlipidemia, unspecified: Secondary | ICD-10-CM | POA: Diagnosis present

## 2024-09-24 DIAGNOSIS — I2510 Atherosclerotic heart disease of native coronary artery without angina pectoris: Secondary | ICD-10-CM | POA: Diagnosis not present

## 2024-09-24 DIAGNOSIS — M79A21 Nontraumatic compartment syndrome of right lower extremity: Secondary | ICD-10-CM | POA: Diagnosis not present

## 2024-09-24 DIAGNOSIS — I7143 Infrarenal abdominal aortic aneurysm, without rupture: Secondary | ICD-10-CM | POA: Diagnosis present

## 2024-09-24 DIAGNOSIS — Z9079 Acquired absence of other genital organ(s): Secondary | ICD-10-CM

## 2024-09-24 DIAGNOSIS — M5416 Radiculopathy, lumbar region: Secondary | ICD-10-CM | POA: Diagnosis not present

## 2024-09-24 DIAGNOSIS — I70221 Atherosclerosis of native arteries of extremities with rest pain, right leg: Secondary | ICD-10-CM | POA: Diagnosis not present

## 2024-09-24 DIAGNOSIS — R0602 Shortness of breath: Secondary | ICD-10-CM | POA: Diagnosis not present

## 2024-09-24 DIAGNOSIS — S81801A Unspecified open wound, right lower leg, initial encounter: Secondary | ICD-10-CM | POA: Diagnosis not present

## 2024-09-24 DIAGNOSIS — Z961 Presence of intraocular lens: Secondary | ICD-10-CM | POA: Diagnosis present

## 2024-09-24 DIAGNOSIS — Z8042 Family history of malignant neoplasm of prostate: Secondary | ICD-10-CM

## 2024-09-24 DIAGNOSIS — G8918 Other acute postprocedural pain: Secondary | ICD-10-CM | POA: Diagnosis not present

## 2024-09-24 DIAGNOSIS — I25110 Atherosclerotic heart disease of native coronary artery with unstable angina pectoris: Secondary | ICD-10-CM | POA: Diagnosis not present

## 2024-09-24 DIAGNOSIS — E871 Hypo-osmolality and hyponatremia: Secondary | ICD-10-CM | POA: Diagnosis present

## 2024-09-24 DIAGNOSIS — Z79899 Other long term (current) drug therapy: Secondary | ICD-10-CM

## 2024-09-24 DIAGNOSIS — J439 Emphysema, unspecified: Secondary | ICD-10-CM | POA: Diagnosis present

## 2024-09-24 DIAGNOSIS — K5730 Diverticulosis of large intestine without perforation or abscess without bleeding: Secondary | ICD-10-CM | POA: Diagnosis present

## 2024-09-24 DIAGNOSIS — F419 Anxiety disorder, unspecified: Secondary | ICD-10-CM | POA: Diagnosis present

## 2024-09-24 DIAGNOSIS — D62 Acute posthemorrhagic anemia: Secondary | ICD-10-CM | POA: Diagnosis not present

## 2024-09-24 DIAGNOSIS — I7409 Other arterial embolism and thrombosis of abdominal aorta: Secondary | ICD-10-CM | POA: Diagnosis not present

## 2024-09-24 DIAGNOSIS — R0902 Hypoxemia: Secondary | ICD-10-CM | POA: Diagnosis not present

## 2024-09-24 DIAGNOSIS — I739 Peripheral vascular disease, unspecified: Secondary | ICD-10-CM | POA: Diagnosis not present

## 2024-09-24 DIAGNOSIS — T79A21A Traumatic compartment syndrome of right lower extremity, initial encounter: Secondary | ICD-10-CM

## 2024-09-24 DIAGNOSIS — Z95828 Presence of other vascular implants and grafts: Secondary | ICD-10-CM | POA: Diagnosis not present

## 2024-09-24 DIAGNOSIS — I951 Orthostatic hypotension: Secondary | ICD-10-CM | POA: Diagnosis not present

## 2024-09-24 DIAGNOSIS — M1990 Unspecified osteoarthritis, unspecified site: Secondary | ICD-10-CM | POA: Diagnosis present

## 2024-09-24 DIAGNOSIS — Z981 Arthrodesis status: Secondary | ICD-10-CM | POA: Diagnosis not present

## 2024-09-24 DIAGNOSIS — Z7982 Long term (current) use of aspirin: Secondary | ICD-10-CM

## 2024-09-24 DIAGNOSIS — E43 Unspecified severe protein-calorie malnutrition: Secondary | ICD-10-CM | POA: Diagnosis present

## 2024-09-24 DIAGNOSIS — Z9889 Other specified postprocedural states: Secondary | ICD-10-CM | POA: Diagnosis not present

## 2024-09-24 DIAGNOSIS — J9811 Atelectasis: Secondary | ICD-10-CM | POA: Diagnosis not present

## 2024-09-24 DIAGNOSIS — F411 Generalized anxiety disorder: Secondary | ICD-10-CM | POA: Diagnosis not present

## 2024-09-24 DIAGNOSIS — R109 Unspecified abdominal pain: Secondary | ICD-10-CM | POA: Diagnosis not present

## 2024-09-24 DIAGNOSIS — M79A29 Nontraumatic compartment syndrome of unspecified lower extremity: Secondary | ICD-10-CM | POA: Diagnosis not present

## 2024-09-24 DIAGNOSIS — T79A21D Traumatic compartment syndrome of right lower extremity, subsequent encounter: Secondary | ICD-10-CM | POA: Diagnosis not present

## 2024-09-24 DIAGNOSIS — M4187 Other forms of scoliosis, lumbosacral region: Secondary | ICD-10-CM | POA: Diagnosis not present

## 2024-09-24 DIAGNOSIS — Z48811 Encounter for surgical aftercare following surgery on the nervous system: Secondary | ICD-10-CM | POA: Diagnosis not present

## 2024-09-24 DIAGNOSIS — Z6827 Body mass index (BMI) 27.0-27.9, adult: Secondary | ICD-10-CM

## 2024-09-24 DIAGNOSIS — C61 Malignant neoplasm of prostate: Secondary | ICD-10-CM | POA: Diagnosis present

## 2024-09-24 HISTORY — PX: FASCIOTOMY: SHX132

## 2024-09-24 HISTORY — PX: AXILLARY-FEMORAL BYPASS GRAFT: SHX894

## 2024-09-24 HISTORY — PX: ENDARTERECTOMY FEMORAL: SHX5804

## 2024-09-24 HISTORY — PX: LOWER EXTREMITY ANGIOGRAM: SHX5508

## 2024-09-24 HISTORY — PX: THROMBECTOMY FEMORAL ARTERY: SHX6406

## 2024-09-24 LAB — HEPARIN LEVEL (UNFRACTIONATED): Heparin Unfractionated: 0.24 [IU]/mL — ABNORMAL LOW (ref 0.30–0.70)

## 2024-09-24 LAB — CK: Total CK: 1434 U/L — ABNORMAL HIGH (ref 49–397)

## 2024-09-24 LAB — CBC
HCT: 31 % — ABNORMAL LOW (ref 39.0–52.0)
Hemoglobin: 10.4 g/dL — ABNORMAL LOW (ref 13.0–17.0)
MCH: 32.9 pg (ref 26.0–34.0)
MCHC: 33.5 g/dL (ref 30.0–36.0)
MCV: 98.1 fL (ref 80.0–100.0)
Platelets: 172 K/uL (ref 150–400)
RBC: 3.16 MIL/uL — ABNORMAL LOW (ref 4.22–5.81)
RDW: 13.2 % (ref 11.5–15.5)
WBC: 10.2 K/uL (ref 4.0–10.5)
nRBC: 0 % (ref 0.0–0.2)

## 2024-09-24 SURGERY — ANGIOGRAM, LOWER EXTREMITY
Anesthesia: General | Site: Leg Lower | Laterality: Right

## 2024-09-24 SURGERY — CREATION, BYPASS, ARTERIAL, AXILLARY TO BILATERAL FEMORAL, USING GRAFT
Anesthesia: General | Site: Leg Lower | Laterality: Right

## 2024-09-24 MED ORDER — PREGABALIN 75 MG PO CAPS
150.0000 mg | ORAL_CAPSULE | Freq: Three times a day (TID) | ORAL | Status: DC
Start: 2024-09-24 — End: 2024-10-11
  Administered 2024-09-24 – 2024-10-11 (×49): 150 mg via ORAL
  Filled 2024-09-24 (×49): qty 2

## 2024-09-24 MED ORDER — HEPARIN (PORCINE) 25000 UT/250ML-% IV SOLN
500.0000 [IU]/h | INTRAVENOUS | Status: AC
Start: 2024-09-24 — End: 2024-09-27
  Administered 2024-09-24 – 2024-09-26 (×2): 500 [IU]/h via INTRAVENOUS
  Filled 2024-09-24 (×2): qty 250

## 2024-09-24 MED ORDER — PRAVASTATIN SODIUM 10 MG PO TABS: 10.0000 mg | ORAL_TABLET | Freq: Every day | ORAL | Status: DC

## 2024-09-24 MED ORDER — DOCUSATE SODIUM 100 MG PO CAPS
100.0000 mg | ORAL_CAPSULE | Freq: Two times a day (BID) | ORAL | Status: DC
  Administered 2024-09-24 – 2024-10-04 (×15): 100 mg via ORAL
  Filled 2024-09-24 (×19): qty 1

## 2024-09-24 MED ORDER — AMISULPRIDE (ANTIEMETIC) 5 MG/2ML IV SOLN: 10.0000 mg | Freq: Once | INTRAVENOUS | Status: DC | PRN

## 2024-09-24 MED ORDER — FENTANYL CITRATE (PF) 250 MCG/5ML IJ SOLN
INTRAMUSCULAR | Status: DC | PRN
  Administered 2024-09-24: 75 ug via INTRAVENOUS

## 2024-09-24 MED ORDER — 0.9 % SODIUM CHLORIDE (POUR BTL) OPTIME
TOPICAL | Status: DC | PRN
  Administered 2024-09-24: 2000 mL

## 2024-09-24 MED ORDER — VANCOMYCIN HCL 1000 MG IV SOLR
INTRAVENOUS | Status: AC
Start: 2024-09-24 — End: 2024-09-24
  Filled 2024-09-24: qty 20

## 2024-09-24 MED ORDER — VANCOMYCIN HCL 1000 MG IV SOLR
INTRAVENOUS | Status: DC | PRN
  Administered 2024-09-24: 1000 mg via INTRAVENOUS

## 2024-09-24 MED ORDER — ALBUMIN HUMAN 5 % IV SOLN: INTRAVENOUS | Status: DC | PRN

## 2024-09-24 MED ORDER — HEPARIN 6000 UNIT IRRIGATION SOLUTION
Status: DC | PRN
  Administered 2024-09-24: 1

## 2024-09-24 MED ORDER — OXYCODONE HCL 5 MG PO TABS
10.0000 mg | ORAL_TABLET | ORAL | Status: DC | PRN
  Administered 2024-09-25 – 2024-10-11 (×23): 10 mg via ORAL
  Filled 2024-09-24 (×26): qty 2

## 2024-09-24 MED ORDER — LIDOCAINE 2% (20 MG/ML) 5 ML SYRINGE
INTRAMUSCULAR | Status: DC | PRN
  Administered 2024-09-24: 60 mg via INTRAVENOUS

## 2024-09-24 MED ORDER — LIDOCAINE 2% (20 MG/ML) 5 ML SYRINGE
INTRAMUSCULAR | Status: AC
  Filled 2024-09-24: qty 5

## 2024-09-24 MED ORDER — MEPERIDINE HCL 25 MG/ML IJ SOLN: 6.2500 mg | INTRAMUSCULAR | Status: DC | PRN

## 2024-09-24 MED ORDER — IPRATROPIUM-ALBUTEROL 0.5-2.5 (3) MG/3ML IN SOLN
RESPIRATORY_TRACT | Status: AC
  Administered 2024-09-24: 3 mL via RESPIRATORY_TRACT
  Filled 2024-09-24: qty 3

## 2024-09-24 MED ORDER — HYDROMORPHONE HCL 1 MG/ML IJ SOLN
INTRAMUSCULAR | Status: AC
  Filled 2024-09-24: qty 1

## 2024-09-24 MED ORDER — ROCURONIUM BROMIDE 10 MG/ML (PF) SYRINGE
PREFILLED_SYRINGE | INTRAVENOUS | Status: DC | PRN
  Administered 2024-09-24: 60 mg via INTRAVENOUS

## 2024-09-24 MED ORDER — NALOXONE HCL 0.4 MG/ML IJ SOLN: 0.4000 mg | INTRAMUSCULAR | Status: DC | PRN

## 2024-09-24 MED ORDER — OXYCODONE HCL 5 MG PO TABS: 5.0000 mg | ORAL_TABLET | Freq: Once | ORAL | Status: DC | PRN

## 2024-09-24 MED ORDER — CITALOPRAM HYDROBROMIDE 20 MG PO TABS
20.0000 mg | ORAL_TABLET | Freq: Every day | ORAL | Status: DC
  Administered 2024-09-24 – 2024-10-11 (×17): 20 mg via ORAL
  Filled 2024-09-24 (×18): qty 1

## 2024-09-24 MED ORDER — PROPOFOL 10 MG/ML IV BOLUS
INTRAVENOUS | Status: AC
  Filled 2024-09-24: qty 20

## 2024-09-24 MED ORDER — HEPARIN SODIUM (PORCINE) 1000 UNIT/ML IJ SOLN
INTRAMUSCULAR | Status: DC | PRN
  Administered 2024-09-24: 2000 [IU] via INTRAVENOUS
  Administered 2024-09-24: 9000 [IU] via INTRAVENOUS

## 2024-09-24 MED ORDER — LACTATED RINGERS IV SOLN: INTRAVENOUS | Status: AC

## 2024-09-24 MED ORDER — LACTATED RINGERS IV SOLN: INTRAVENOUS | Status: DC

## 2024-09-24 MED ORDER — ORAL CARE MOUTH RINSE: 15.0000 mL | Freq: Once | OROMUCOSAL | Status: AC

## 2024-09-24 MED ORDER — HYDROMORPHONE HCL 1 MG/ML IJ SOLN
0.2500 mg | INTRAMUSCULAR | Status: DC | PRN
  Administered 2024-09-24: 0.5 mg via INTRAVENOUS

## 2024-09-24 MED ORDER — PROTAMINE SULFATE 10 MG/ML IV SOLN
INTRAVENOUS | Status: DC | PRN
  Administered 2024-09-24: 50 mg via INTRAVENOUS

## 2024-09-24 MED ORDER — ROCURONIUM BROMIDE 10 MG/ML (PF) SYRINGE
PREFILLED_SYRINGE | INTRAVENOUS | Status: DC | PRN
  Administered 2024-09-24: 30 mg via INTRAVENOUS
  Administered 2024-09-24: 20 mg via INTRAVENOUS
  Administered 2024-09-24: 50 mg via INTRAVENOUS
  Administered 2024-09-24: 20 mg via INTRAVENOUS

## 2024-09-24 MED ORDER — SUGAMMADEX SODIUM 200 MG/2ML IV SOLN
INTRAVENOUS | Status: DC | PRN
  Administered 2024-09-24: 200 mg via INTRAVENOUS

## 2024-09-24 MED ORDER — DEXAMETHASONE SODIUM PHOSPHATE 10 MG/ML IJ SOLN
INTRAMUSCULAR | Status: AC
  Filled 2024-09-24: qty 1

## 2024-09-24 MED ORDER — FENTANYL CITRATE (PF) 250 MCG/5ML IJ SOLN
INTRAMUSCULAR | Status: AC
  Filled 2024-09-24: qty 5

## 2024-09-24 MED ORDER — SENNOSIDES-DOCUSATE SODIUM 8.6-50 MG PO TABS
1.0000 | ORAL_TABLET | Freq: Every evening | ORAL | Status: DC | PRN
  Administered 2024-09-26 – 2024-09-27 (×2): 1 via ORAL
  Filled 2024-09-24 (×2): qty 1

## 2024-09-24 MED ORDER — FENTANYL CITRATE (PF) 100 MCG/2ML IJ SOLN: 25.0000 ug | INTRAMUSCULAR | Status: DC | PRN

## 2024-09-24 MED ORDER — TAMSULOSIN HCL 0.4 MG PO CAPS
0.4000 mg | ORAL_CAPSULE | Freq: Every day | ORAL | Status: DC
  Administered 2024-09-24 – 2024-10-10 (×16): 0.4 mg via ORAL
  Filled 2024-09-24 (×16): qty 1

## 2024-09-24 MED ORDER — ROSUVASTATIN CALCIUM 20 MG PO TABS
20.0000 mg | ORAL_TABLET | Freq: Every day | ORAL | Status: DC
  Administered 2024-09-24 – 2024-10-11 (×17): 20 mg via ORAL
  Filled 2024-09-24 (×17): qty 1

## 2024-09-24 MED ORDER — PROPOFOL 10 MG/ML IV BOLUS
INTRAVENOUS | Status: DC | PRN
Start: 2024-09-24 — End: 2024-09-24
  Administered 2024-09-24: 90 mg via INTRAVENOUS

## 2024-09-24 MED ORDER — ONDANSETRON HCL 4 MG/2ML IJ SOLN
INTRAMUSCULAR | Status: AC
Start: 2024-09-24 — End: 2024-09-24
  Filled 2024-09-24: qty 2

## 2024-09-24 MED ORDER — LACTATED RINGERS IV SOLN: INTRAVENOUS | Status: DC | PRN

## 2024-09-24 MED ORDER — PHENYLEPHRINE HCL-NACL 20-0.9 MG/250ML-% IV SOLN: INTRAVENOUS | Status: DC | PRN

## 2024-09-24 MED ORDER — HEPARIN 6000 UNIT IRRIGATION SOLUTION
Status: AC
  Filled 2024-09-24: qty 500

## 2024-09-24 MED ORDER — ARTIFICIAL TEARS OPHTHALMIC OINT
TOPICAL_OINTMENT | OPHTHALMIC | Status: AC
Start: 2024-09-24 — End: 2024-09-24
  Filled 2024-09-24: qty 3.5

## 2024-09-24 MED ORDER — FENTANYL CITRATE (PF) 250 MCG/5ML IJ SOLN
INTRAMUSCULAR | Status: DC | PRN
  Administered 2024-09-24: 50 ug via INTRAVENOUS
  Administered 2024-09-24: 100 ug via INTRAVENOUS
  Administered 2024-09-24 (×2): 50 ug via INTRAVENOUS

## 2024-09-24 MED ORDER — CHLORHEXIDINE GLUCONATE 0.12 % MT SOLN: 15.0000 mL | Freq: Once | OROMUCOSAL | Status: AC

## 2024-09-24 MED ORDER — ALBUTEROL SULFATE (2.5 MG/3ML) 0.083% IN NEBU: 3.0000 mL | INHALATION_SOLUTION | Freq: Four times a day (QID) | RESPIRATORY_TRACT | Status: DC | PRN

## 2024-09-24 MED ORDER — HYDROMORPHONE HCL 1 MG/ML IJ SOLN
INTRAMUSCULAR | Status: AC
  Filled 2024-09-24: qty 0.5

## 2024-09-24 MED ORDER — ONDANSETRON HCL 4 MG/2ML IJ SOLN
INTRAMUSCULAR | Status: DC | PRN
  Administered 2024-09-24: 4 mg via INTRAVENOUS

## 2024-09-24 MED ORDER — PROTAMINE SULFATE 10 MG/ML IV SOLN
INTRAVENOUS | Status: AC
  Filled 2024-09-24: qty 5

## 2024-09-24 MED ORDER — PHENYLEPHRINE HCL-NACL 20-0.9 MG/250ML-% IV SOLN
INTRAVENOUS | Status: DC | PRN
  Administered 2024-09-24: 40 ug/min via INTRAVENOUS

## 2024-09-24 MED ORDER — LIDOCAINE 2% (20 MG/ML) 5 ML SYRINGE
INTRAMUSCULAR | Status: DC | PRN
  Administered 2024-09-24: 40 mg via INTRAVENOUS

## 2024-09-24 MED ORDER — CHLORHEXIDINE GLUCONATE 0.12 % MT SOLN
OROMUCOSAL | Status: AC
  Administered 2024-09-24: 15 mL via OROMUCOSAL
  Filled 2024-09-24: qty 15

## 2024-09-24 MED ORDER — PHENYLEPHRINE HCL-NACL 20-0.9 MG/250ML-% IV SOLN
INTRAVENOUS | Status: DC | PRN
  Administered 2024-09-24: 10 ug/min via INTRAVENOUS

## 2024-09-24 MED ORDER — OXYCODONE HCL 5 MG/5ML PO SOLN: 5.0000 mg | Freq: Once | ORAL | Status: DC | PRN

## 2024-09-24 MED ORDER — FENTANYL CITRATE (PF) 100 MCG/2ML IJ SOLN: 50.0000 ug | Freq: Once | INTRAMUSCULAR | Status: DC

## 2024-09-24 MED ORDER — ZOLPIDEM TARTRATE 5 MG PO TABS
5.0000 mg | ORAL_TABLET | Freq: Every evening | ORAL | Status: DC | PRN
  Administered 2024-10-05 – 2024-10-10 (×3): 5 mg via ORAL
  Filled 2024-09-24 (×3): qty 1

## 2024-09-24 MED ORDER — SODIUM CHLORIDE 0.9 % IV SOLN
500.0000 mL | Freq: Once | INTRAVENOUS | Status: AC | PRN
  Administered 2024-09-25: 500 mL via INTRAVENOUS

## 2024-09-24 MED ORDER — ACETAMINOPHEN 325 MG PO TABS
650.0000 mg | ORAL_TABLET | Freq: Four times a day (QID) | ORAL | Status: DC | PRN
  Administered 2024-09-27 – 2024-10-06 (×6): 650 mg via ORAL
  Filled 2024-09-24 (×6): qty 2

## 2024-09-24 MED ORDER — IPRATROPIUM-ALBUTEROL 0.5-2.5 (3) MG/3ML IN SOLN
3.0000 mL | Freq: Two times a day (BID) | RESPIRATORY_TRACT | Status: DC
  Filled 2024-09-24: qty 3

## 2024-09-24 MED ORDER — VANCOMYCIN HCL IN DEXTROSE 1-5 GM/200ML-% IV SOLN
INTRAVENOUS | Status: AC
  Filled 2024-09-24: qty 200

## 2024-09-24 MED ORDER — ONDANSETRON HCL 4 MG PO TABS: 4.0000 mg | ORAL_TABLET | Freq: Four times a day (QID) | ORAL | Status: DC | PRN

## 2024-09-24 MED ORDER — ROCURONIUM BROMIDE 10 MG/ML (PF) SYRINGE
PREFILLED_SYRINGE | INTRAVENOUS | Status: AC
  Filled 2024-09-24: qty 10

## 2024-09-24 MED ORDER — FENTANYL CITRATE (PF) 100 MCG/2ML IJ SOLN
INTRAMUSCULAR | Status: AC
  Filled 2024-09-24: qty 2

## 2024-09-24 MED ORDER — VANCOMYCIN HCL IN DEXTROSE 1-5 GM/200ML-% IV SOLN
1000.0000 mg | Freq: Two times a day (BID) | INTRAVENOUS | Status: AC
  Administered 2024-09-24 – 2024-09-25 (×2): 1000 mg via INTRAVENOUS
  Filled 2024-09-24 (×2): qty 200

## 2024-09-24 MED ORDER — HYDROMORPHONE HCL 1 MG/ML IJ SOLN
INTRAMUSCULAR | Status: DC | PRN
  Administered 2024-09-24: .5 mg via INTRAVENOUS

## 2024-09-24 MED ORDER — POTASSIUM CHLORIDE CRYS ER 20 MEQ PO TBCR: 40.0000 meq | EXTENDED_RELEASE_TABLET | Freq: Every day | ORAL | Status: DC | PRN

## 2024-09-24 MED ORDER — IODIXANOL 320 MG/ML IV SOLN
INTRAVENOUS | Status: DC | PRN
  Administered 2024-09-24: 40 mL

## 2024-09-24 MED ORDER — ACETAMINOPHEN 325 MG PO TABS: 325.0000 mg | ORAL_TABLET | ORAL | Status: DC | PRN

## 2024-09-24 MED ORDER — HEPARIN SODIUM (PORCINE) 1000 UNIT/ML IJ SOLN
INTRAMUSCULAR | Status: AC
  Filled 2024-09-24: qty 20

## 2024-09-24 MED ORDER — ONDANSETRON HCL 4 MG/2ML IJ SOLN
INTRAMUSCULAR | Status: AC
  Filled 2024-09-24: qty 2

## 2024-09-24 MED ORDER — HEPARIN SODIUM (PORCINE) 1000 UNIT/ML IJ SOLN
INTRAMUSCULAR | Status: DC | PRN
  Administered 2024-09-24: 8000 [IU] via INTRAVENOUS

## 2024-09-24 MED ORDER — MIDAZOLAM HCL 2 MG/2ML IJ SOLN
INTRAMUSCULAR | Status: DC | PRN
  Administered 2024-09-24: 1 mg via INTRAVENOUS

## 2024-09-24 MED ORDER — OXYCODONE HCL ER 10 MG PO T12A
10.0000 mg | EXTENDED_RELEASE_TABLET | Freq: Two times a day (BID) | ORAL | Status: DC
  Administered 2024-09-24 – 2024-10-11 (×33): 10 mg via ORAL
  Filled 2024-09-24 (×33): qty 1

## 2024-09-24 MED ORDER — DULOXETINE HCL 30 MG PO CPEP
30.0000 mg | ORAL_CAPSULE | Freq: Every day | ORAL | Status: DC
  Administered 2024-09-24 – 2024-10-10 (×17): 30 mg via ORAL
  Filled 2024-09-24 (×17): qty 1

## 2024-09-24 MED ORDER — PROPOFOL 10 MG/ML IV BOLUS
INTRAVENOUS | Status: DC | PRN
  Administered 2024-09-24: 120 mg via INTRAVENOUS

## 2024-09-24 MED ORDER — DROPERIDOL 2.5 MG/ML IJ SOLN: 0.6250 mg | Freq: Once | INTRAMUSCULAR | Status: DC | PRN

## 2024-09-24 MED ORDER — SURGIFLO WITH THROMBIN (HEMOSTATIC MATRIX KIT) OPTIME
TOPICAL | Status: DC | PRN
  Administered 2024-09-24: 3 via TOPICAL

## 2024-09-24 MED ORDER — LIDOCAINE 2% (20 MG/ML) 5 ML SYRINGE
INTRAMUSCULAR | Status: AC
Start: 2024-09-24 — End: 2024-09-24
  Filled 2024-09-24: qty 5

## 2024-09-24 MED ORDER — PHENYLEPHRINE 80 MCG/ML (10ML) SYRINGE FOR IV PUSH (FOR BLOOD PRESSURE SUPPORT)
PREFILLED_SYRINGE | INTRAVENOUS | Status: AC
  Filled 2024-09-24: qty 10

## 2024-09-24 MED ORDER — HEPARIN SODIUM (PORCINE) 1000 UNIT/ML IJ SOLN
INTRAMUSCULAR | Status: AC
  Filled 2024-09-24: qty 10

## 2024-09-24 MED ORDER — PHENYLEPHRINE 80 MCG/ML (10ML) SYRINGE FOR IV PUSH (FOR BLOOD PRESSURE SUPPORT)
PREFILLED_SYRINGE | INTRAVENOUS | Status: DC | PRN
  Administered 2024-09-24: 80 ug via INTRAVENOUS

## 2024-09-24 MED ORDER — VANCOMYCIN HCL 1000 MG IV SOLR
INTRAVENOUS | Status: AC
  Filled 2024-09-24: qty 20

## 2024-09-24 MED ORDER — ACETAMINOPHEN 10 MG/ML IV SOLN: 1000.0000 mg | Freq: Once | INTRAVENOUS | Status: DC | PRN

## 2024-09-24 MED ORDER — ONDANSETRON HCL 4 MG/2ML IJ SOLN
4.0000 mg | Freq: Four times a day (QID) | INTRAMUSCULAR | Status: DC | PRN
  Administered 2024-10-06: 4 mg via INTRAVENOUS

## 2024-09-24 MED ORDER — SODIUM CHLORIDE 0.9 % IV SOLN: INTRAVENOUS | Status: DC | PRN

## 2024-09-24 MED ORDER — DEXAMETHASONE SODIUM PHOSPHATE 10 MG/ML IJ SOLN
INTRAMUSCULAR | Status: DC | PRN
  Administered 2024-09-24: 10 mg via INTRAVENOUS

## 2024-09-24 MED ORDER — FENTANYL CITRATE (PF) 100 MCG/2ML IJ SOLN
50.0000 ug | Freq: Once | INTRAMUSCULAR | Status: AC
  Administered 2024-09-24: 50 ug via INTRAVENOUS

## 2024-09-24 MED ORDER — MIDAZOLAM HCL 2 MG/2ML IJ SOLN
INTRAMUSCULAR | Status: AC
  Filled 2024-09-24: qty 2

## 2024-09-24 MED ORDER — ROCURONIUM BROMIDE 10 MG/ML (PF) SYRINGE
PREFILLED_SYRINGE | INTRAVENOUS | Status: AC
Start: 2024-09-24 — End: 2024-09-24
  Filled 2024-09-24: qty 10

## 2024-09-24 MED ORDER — ASPIRIN 81 MG PO TBEC
81.0000 mg | DELAYED_RELEASE_TABLET | Freq: Every day | ORAL | Status: DC
Start: 2024-09-24 — End: 2024-10-11
  Administered 2024-09-24 – 2024-10-11 (×16): 81 mg via ORAL
  Filled 2024-09-24 (×17): qty 1

## 2024-09-24 MED ORDER — MORPHINE SULFATE (PF) 2 MG/ML IV SOLN
2.0000 mg | INTRAVENOUS | Status: DC | PRN
  Administered 2024-09-24 – 2024-10-07 (×16): 2 mg via INTRAVENOUS
  Filled 2024-09-24 (×16): qty 1

## 2024-09-24 MED ORDER — PHENYLEPHRINE 80 MCG/ML (10ML) SYRINGE FOR IV PUSH (FOR BLOOD PRESSURE SUPPORT)
PREFILLED_SYRINGE | INTRAVENOUS | Status: DC | PRN
  Administered 2024-09-24 (×5): 80 ug via INTRAVENOUS

## 2024-09-24 SURGICAL SUPPLY — 75 items
BAG COUNTER SPONGE SURGICOUNT (BAG) ×5 IMPLANT
BANDAGE ESMARK 6X9 LF (GAUZE/BANDAGES/DRESSINGS) IMPLANT
BNDG ELASTIC 4X5.8 VLCR NS LF (GAUZE/BANDAGES/DRESSINGS) ×1 IMPLANT
BNDG ELASTIC 6INX 5YD STR LF (GAUZE/BANDAGES/DRESSINGS) ×1 IMPLANT
BNDG ELASTIC 6X10 VLCR STRL LF (GAUZE/BANDAGES/DRESSINGS) ×1 IMPLANT
BNDG GAUZE DERMACEA FLUFF 4 (GAUZE/BANDAGES/DRESSINGS) ×2 IMPLANT
CANISTER SUCTION 3000ML PPV (SUCTIONS) ×5 IMPLANT
CANNULA VESSEL 3MM 2 BLNT TIP (CANNULA) ×5 IMPLANT
CATH EMB 3FR 80 (CATHETERS) ×1 IMPLANT
CATH EMB 4FR 80 (CATHETERS) ×1 IMPLANT
CLIP TI MEDIUM 24 (CLIP) ×5 IMPLANT
CLIP TI WIDE RED SMALL 24 (CLIP) ×5 IMPLANT
COVER DOME SNAP 22 D (MISCELLANEOUS) IMPLANT
COVER SURGICAL LIGHT HANDLE (MISCELLANEOUS) ×1 IMPLANT
CUFF TOURN SGL QUICK 42 (TOURNIQUET CUFF) IMPLANT
CUFF TRNQT CYL 24X4X16.5-23 (TOURNIQUET CUFF) IMPLANT
CUFF TRNQT CYL 34X4.125X (TOURNIQUET CUFF) IMPLANT
DERMABOND ADVANCED .7 DNX12 (GAUZE/BANDAGES/DRESSINGS) ×5 IMPLANT
DERMABOND ADVANCED .7 DNX6 (GAUZE/BANDAGES/DRESSINGS) ×3 IMPLANT
DRAIN SNY 10X20 3/4 PERF (WOUND CARE) IMPLANT
DRAPE HALF SHEET 40X57 (DRAPES) IMPLANT
DRAPE INCISE IOBAN 66X45 STRL (DRAPES) ×5 IMPLANT
DRAPE X-RAY CASS 24X20 (DRAPES) IMPLANT
ELECT CAUTERY BLADE 6.4 (BLADE) ×1 IMPLANT
ELECTRODE REM PT RTRN 9FT ADLT (ELECTROSURGICAL) ×5 IMPLANT
EVACUATOR SILICONE 100CC (DRAIN) IMPLANT
GAUZE 4X4 16PLY ~~LOC~~+RFID DBL (SPONGE) ×1 IMPLANT
GAUZE SPONGE 4X4 12PLY STRL (GAUZE/BANDAGES/DRESSINGS) ×1 IMPLANT
GLOVE BIOGEL PI IND STRL 8 (GLOVE) ×5 IMPLANT
GLOVE SURG SS PI 7.5 STRL IVOR (GLOVE) ×5 IMPLANT
GOWN STRL REUS W/ TWL LRG LVL3 (GOWN DISPOSABLE) ×10 IMPLANT
GOWN STRL REUS W/ TWL XL LVL3 (GOWN DISPOSABLE) ×5 IMPLANT
GRAFT PROPATEN W/RING 8X80X70 (Vascular Products) ×2 IMPLANT
GRAFT VASC STRETCH 8X40 (Graft) IMPLANT
HEMOSTAT SNOW SURGICEL 2X4 (HEMOSTASIS) IMPLANT
INSERT FOGARTY SM (MISCELLANEOUS) IMPLANT
KIT BASIN OR (CUSTOM PROCEDURE TRAY) ×5 IMPLANT
KIT TURNOVER KIT B (KITS) ×5 IMPLANT
LOOP VESSEL MINI RED (MISCELLANEOUS) ×2 IMPLANT
MARKER GRAFT CORONARY BYPASS (MISCELLANEOUS) IMPLANT
PACK PERIPHERAL VASCULAR (CUSTOM PROCEDURE TRAY) ×5 IMPLANT
PAD ARMBOARD POSITIONER FOAM (MISCELLANEOUS) ×10 IMPLANT
PAD MAGNETIC INSTR ST 16X20 (MISCELLANEOUS) ×1 IMPLANT
PUNCH AORTIC ROTATE 5MM 8IN (MISCELLANEOUS) ×1 IMPLANT
SET COLLECT BLD 21X3/4 12 (NEEDLE) IMPLANT
SET MICROPUNCTURE 5F STIFF (MISCELLANEOUS) IMPLANT
SOLN 0.9% NACL 1000 ML (IV SOLUTION) ×8 IMPLANT
SOLN 0.9% NACL POUR BTL 1000ML (IV SOLUTION) ×10 IMPLANT
SOLN STERILE WATER 1000 ML (IV SOLUTION) ×4 IMPLANT
SOLN STERILE WATER BTL 1000 ML (IV SOLUTION) ×5 IMPLANT
SPONGE T-LAP 18X18 ~~LOC~~+RFID (SPONGE) ×3 IMPLANT
STAPLER SKIN PROX 35W (STAPLE) ×1 IMPLANT
STOPCOCK 4 WAY LG BORE MALE ST (IV SETS) IMPLANT
SURGIFLO W/THROMBIN 8M KIT (HEMOSTASIS) ×1 IMPLANT
SUT ETHILON 3 0 PS 1 (SUTURE) IMPLANT
SUT GORETEX 5 0 TT13 24 (SUTURE) IMPLANT
SUT GORETEX 6.0 TT9 (SUTURE) IMPLANT
SUT PROLENE 5 0 C 1 24 (SUTURE) ×5 IMPLANT
SUT PROLENE 5 0 C 1 36 (SUTURE) IMPLANT
SUT PROLENE 6 0 BV (SUTURE) ×22 IMPLANT
SUT PROLENE 7 0 BV 1 (SUTURE) ×1 IMPLANT
SUT PROLENE BLUE 7 0 (SUTURE) ×1 IMPLANT
SUT SILK 2 0 SH (SUTURE) ×5 IMPLANT
SUT SILK 3-0 18XBRD TIE 12 (SUTURE) ×1 IMPLANT
SUT VIC AB 2-0 CT1 TAPERPNT 27 (SUTURE) ×12 IMPLANT
SUT VIC AB 3-0 SH 27X BRD (SUTURE) ×10 IMPLANT
SUT VIC AB 4-0 PS2 18 (SUTURE) ×12 IMPLANT
SYR 10ML LL (SYRINGE) IMPLANT
SYR 3ML LL SCALE MARK (SYRINGE) ×1 IMPLANT
TAPE UMBILICAL 1/8X30 (MISCELLANEOUS) IMPLANT
TOWEL GREEN STERILE (TOWEL DISPOSABLE) ×5 IMPLANT
TRAY FOLEY MTR SLVR 14FR STAT (SET/KITS/TRAYS/PACK) IMPLANT
TRAY FOLEY MTR SLVR 16FR STAT (SET/KITS/TRAYS/PACK) ×5 IMPLANT
TUBING EXTENTION W/L.L. (IV SETS) IMPLANT
UNDERPAD 30X36 HEAVY ABSORB (UNDERPADS AND DIAPERS) ×5 IMPLANT

## 2024-09-24 SURGICAL SUPPLY — 53 items
BAG BANDED W/RUBBER/TAPE 36X54 (MISCELLANEOUS) ×2 IMPLANT
BAG COUNTER SPONGE SURGICOUNT (BAG) ×2 IMPLANT
BAG SNAP BAND KOVER 36X36 (MISCELLANEOUS) ×2 IMPLANT
BLADE SURG 11 STRL SS (BLADE) ×2 IMPLANT
BNDG ELASTIC 6INX 5YD STR LF (GAUZE/BANDAGES/DRESSINGS) IMPLANT
BNDG GAUZE DERMACEA FLUFF 4 (GAUZE/BANDAGES/DRESSINGS) IMPLANT
CANISTER SUCTION 3000ML PPV (SUCTIONS) ×2 IMPLANT
CATH ANGIO 5F BER2 65CM (CATHETERS) IMPLANT
CATH EMB 3FR 40 (CATHETERS) IMPLANT
CATH EMB 4FR 40 (CATHETERS) IMPLANT
CATH OMNI FLUSH .035X70CM (CATHETERS) IMPLANT
CHLORAPREP W/TINT 26 (MISCELLANEOUS) IMPLANT
COVER DOME SNAP 22 D (MISCELLANEOUS) ×2 IMPLANT
COVER PROBE W GEL 5X96 (DRAPES) ×2 IMPLANT
COVER SURGICAL LIGHT HANDLE (MISCELLANEOUS) ×2 IMPLANT
DERMABOND ADVANCED .7 DNX12 (GAUZE/BANDAGES/DRESSINGS) ×4 IMPLANT
DEVICE TORQUE H2O (MISCELLANEOUS) IMPLANT
DRAPE FEMORAL ANGIO 80X135IN (DRAPES) ×2 IMPLANT
DRAPE X-RAY CASS 24X20 (DRAPES) IMPLANT
DRSG COVADERM 4X10 (GAUZE/BANDAGES/DRESSINGS) IMPLANT
GAUZE 4X4 16PLY ~~LOC~~+RFID DBL (SPONGE) ×2 IMPLANT
GLOVE BIOGEL PI IND STRL 8 (GLOVE) ×2 IMPLANT
GLOVE SURG SS PI 7.5 STRL IVOR (GLOVE) ×2 IMPLANT
GOWN STRL REUS W/ TWL LRG LVL3 (GOWN DISPOSABLE) ×4 IMPLANT
GOWN STRL REUS W/ TWL XL LVL3 (GOWN DISPOSABLE) ×2 IMPLANT
GUIDEWIRE ANGLED .035X150CM (WIRE) IMPLANT
KIT BASIN OR (CUSTOM PROCEDURE TRAY) ×2 IMPLANT
KIT TURNOVER KIT B (KITS) ×2 IMPLANT
NDL PERC 18GX7CM (NEEDLE) ×2 IMPLANT
NEEDLE PERC 18GX7CM (NEEDLE) ×1 IMPLANT
PACK SRG BSC III STRL LF ECLPS (CUSTOM PROCEDURE TRAY) ×2 IMPLANT
PAD ABD 8X10 STRL (GAUZE/BANDAGES/DRESSINGS) IMPLANT
PAD ARMBOARD POSITIONER FOAM (MISCELLANEOUS) ×4 IMPLANT
SET MICROPUNCTURE 5F STIFF (MISCELLANEOUS) ×2 IMPLANT
SHEATH AVANTI 11CM 5FR (SHEATH) IMPLANT
SOLN 0.9% NACL 1000 ML (IV SOLUTION) IMPLANT
SOLN 0.9% NACL POUR BTL 1000ML (IV SOLUTION) IMPLANT
STAPLER SKIN PROX 35W (STAPLE) IMPLANT
STATION PROTECTION PRESSURIZED (MISCELLANEOUS) ×2 IMPLANT
STOPCOCK MORSE 400PSI 3WAY (MISCELLANEOUS) ×2 IMPLANT
SURGIFLO W/THROMBIN 8M KIT (HEMOSTASIS) IMPLANT
SUT PROLENE 5 0 C 1 24 (SUTURE) IMPLANT
SUT PROLENE 6 0 BV (SUTURE) IMPLANT
SUT VIC AB 2-0 CT1 TAPERPNT 27 (SUTURE) IMPLANT
SUT VIC AB 3-0 SH 27X BRD (SUTURE) IMPLANT
SUT VIC AB 4-0 PS2 18 (SUTURE) IMPLANT
SYR 10ML LL (SYRINGE) ×6 IMPLANT
SYR 20ML LL LF (SYRINGE) ×2 IMPLANT
SYR 30ML LL (SYRINGE) ×2 IMPLANT
SYR MEDRAD MARK V 150ML (SYRINGE) IMPLANT
TOWEL GREEN STERILE (TOWEL DISPOSABLE) ×4 IMPLANT
TUBING HIGH PRESSURE 120CM (CONNECTOR) ×2 IMPLANT
WIRE BENTSON .035X145CM (WIRE) ×2 IMPLANT

## 2024-09-24 NOTE — Progress Notes (Signed)
 PHARMACY - ANTICOAGULATION CONSULT NOTE  Pharmacy Consult for Heparin  Indication:  arterial insufficiency / occlusion  Allergies  Allergen Reactions   Ancef  [Cefazolin ] Anaphylaxis    Patient Measurements:    Vital Signs: Temp: 97.5 F (36.4 C) (09/26 2000) Temp Source: Oral (09/26 2000) BP: 107/79 (09/26 2000) Pulse Rate: 90 (09/26 2000)  Labs: Recent Labs    09/22/24 0551 09/22/24 1741 09/23/24 0507 09/23/24 2045 09/24/24 0318  HGB  --   --  10.8*  --  10.4*  HCT  --   --  32.4*  --  31.0*  PLT  --   --  164  --  172  HEPARINUNFRC  --    < > 0.28* 0.41 0.24*  CREATININE 0.88  --   --   --   --   CKTOTAL  --   --   --   --  1,434*   < > = values in this interval not displayed.    Estimated Creatinine Clearance: 78.1 mL/min (by C-G formula based on SCr of 0.88 mg/dL).  Assessment: 89 YOM admitted w/ severe vascular insufficiency who is tentatively scheduled for right axillary bifemoral bypass w/ possible right common femoral to popliteal bypass. No PTA AC noted through a search of his EMR. Of note, he had recent lumbar fusion on 09/08/2024. Consult for heparin  therapy received 09/22/24.   9/24 RPh visited patient at bedside. He has a bruise that encompasses the entire backside of his left hand from a blood draw this morning. Patient stated the bruise developed to that size within 20 minutes of draw. Ttarget a lower goal for heparin  and omit the load dose given recent surgery and the above observation.   Now s/p R popliteal and tibial thrombectomy. Pharmacy requested to resume heparin  infusion at 500 units/hr.   Goal of Therapy:  Heparin  level 0.3-0.5 units/ml Monitor platelets by anticoagulation protocol: Yes   Plan:  Restart heparin  infusion at 500 units/hr per consult request Check heparin  level in 8 hours with AM labs and daily while on heparin  Continue to monitor H&H and platelets  Thank you for allowing pharmacy to be a part of this patient's  care.  Shelba Collier, PharmD, BCPS Clinical Pharmacist

## 2024-09-24 NOTE — Anesthesia Procedure Notes (Signed)
 Procedure Name: Intubation Date/Time: 09/24/2024 4:37 PM  Performed by: Christopher Comings, CRNAPre-anesthesia Checklist: Patient identified, Emergency Drugs available, Suction available and Patient being monitored Patient Re-evaluated:Patient Re-evaluated prior to induction Oxygen  Delivery Method: Circle system utilized Preoxygenation: Pre-oxygenation with 100% oxygen  Induction Type: IV induction Ventilation: Mask ventilation without difficulty and Oral airway inserted - appropriate to patient size Laryngoscope Size: Mac and 4 Grade View: Grade I Tube type: Oral Tube size: 7.5 mm Number of attempts: 1 Airway Equipment and Method: Stylet and Oral airway Placement Confirmation: ETT inserted through vocal cords under direct vision, positive ETCO2 and breath sounds checked- equal and bilateral Secured at: 23 cm Tube secured with: Tape Dental Injury: Teeth and Oropharynx as per pre-operative assessment

## 2024-09-24 NOTE — Progress Notes (Signed)
 Patient's signals in the right foot do not sound as good as they did in the OR and the right leg is  little cooler than the left.  I will pan on taking him back to the OR for exploration  David King

## 2024-09-24 NOTE — Progress Notes (Signed)
 Occupational Therapy Discharge Note  This patient was unable to complete the inpatient rehab program due to requiring sx; therefore did not meet their long term goals. Pt left the program at a mod  assist level for their  functional ADLs. This patient is being discharged from OT services at this time.  BIMS at time of d/c  Pt unable to complete due to medical status  See CareTool for functional status details.  If the patient is able to return to inpatient rehabilitation within 3 midnights, this may be considered an interrupted stay and therapy services will resume as ordered. Modification and reinstatement of their goals will be made upon completion of therapy service reevaluations.

## 2024-09-24 NOTE — Anesthesia Preprocedure Evaluation (Signed)
 Anesthesia Evaluation  Patient identified by MRN, date of birth, ID band Patient awake    Reviewed: Allergy  & Precautions, NPO status , Patient's Chart, lab work & pertinent test results  Airway Mallampati: II  TM Distance: >3 FB Neck ROM: Full    Dental   Pulmonary COPD, former smoker   breath sounds clear to auscultation       Cardiovascular hypertension, Pt. on medications + CAD and + Peripheral Vascular Disease   Rhythm:Regular Rate:Normal     Neuro/Psych  Neuromuscular disease    GI/Hepatic Neg liver ROS,GERD  ,,  Endo/Other  negative endocrine ROS    Renal/GU Renal disease     Musculoskeletal  (+) Arthritis ,    Abdominal   Peds  Hematology  (+) Blood dyscrasia, anemia   Anesthesia Other Findings   Reproductive/Obstetrics                              Anesthesia Physical Anesthesia Plan  ASA: 3  Anesthesia Plan: General   Post-op Pain Management: Tylenol  PO (pre-op)*   Induction: Intravenous  PONV Risk Score and Plan: 2 and Dexamethasone , Ondansetron  and Treatment may vary due to age or medical condition  Airway Management Planned: Oral ETT  Additional Equipment: Arterial line  Intra-op Plan:   Post-operative Plan: Extubation in OR  Informed Consent: I have reviewed the patients History and Physical, chart, labs and discussed the procedure including the risks, benefits and alternatives for the proposed anesthesia with the patient or authorized representative who has indicated his/her understanding and acceptance.     Dental advisory given  Plan Discussed with: CRNA  Anesthesia Plan Comments:         Anesthesia Quick Evaluation

## 2024-09-24 NOTE — Transfer of Care (Signed)
 Immediate Anesthesia Transfer of Care Note  Patient: David King  Procedure(s) Performed: RIGHT LOWER EXTREMITY ANGIOGRAM AND RIGHT POPLETEAL THROMBECTOMY (Right: Leg Lower)  Patient Location: PACU  Anesthesia Type:General  Level of Consciousness: drowsy and patient cooperative  Airway & Oxygen  Therapy: Patient Spontanous Breathing  Post-op Assessment: Report given to RN and Post -op Vital signs reviewed and stable  Post vital signs: Reviewed and stable  Last Vitals:  Vitals Value Taken Time  BP 147/63 1814  Temp    Pulse 98 1814  Resp 12 1814  SpO2 95 1814    Last Pain:  Vitals:   09/24/24 1624  PainSc: 0-No pain         Complications: There were no known notable events for this encounter.

## 2024-09-24 NOTE — Transfer of Care (Signed)
 Immediate Anesthesia Transfer of Care Note  Patient: David King  Procedure(s) Performed: CREATION, BYPASS, ARTERIAL, AXILLARY TO BILATERAL FEMORAL, USING PROPATEN X 80CM X 2 GRAFT (Right: Axilla) BILATERAL FEMORAL ENDARTERECTOMY (Bilateral: Groin) BILATERAL FEMORAL ARTERY THROMBECTOMY (Bilateral: Groin) RIGHT LOWER LEG FOUR COMPARTMENT FASCIOTOMY WITH RESECTION OF TIBIALIS ANTERIOR (Right: Leg Lower)  Patient Location: PACU  Anesthesia Type:General  Level of Consciousness: drowsy and patient cooperative  Airway & Oxygen  Therapy: Patient connected to face mask oxygen   Post-op Assessment: Report given to RN and Post -op Vital signs reviewed and stable  Post vital signs: Reviewed and stable  Last Vitals:  Vitals Value Taken Time  BP 124/98 1546  Temp    Pulse 98 1546  Resp 12 1546  SpO2 96 1546    Last Pain:  Vitals:   09/24/24 0937  TempSrc:   PainSc: 9       Patients Stated Pain Goal: 1 (09/21/24 2136)  Complications: There were no known notable events for this encounter.

## 2024-09-24 NOTE — Progress Notes (Signed)
 Morning medication held. Patient NPO. Currently on Heparin  continuous infusion. Provider informed and is aware of rash on patient anterior area of right lower leg. From nurse prior explained report given for patient.

## 2024-09-24 NOTE — Anesthesia Procedure Notes (Signed)
 Arterial Line Insertion Start/End9/26/2025 9:15 AM, 09/24/2024 9:20 AM Performed by: Epifanio Charleston, MD, anesthesiologist  Patient location: Pre-op. Preanesthetic checklist: patient identified, IV checked, site marked, risks and benefits discussed, surgical consent, monitors and equipment checked, pre-op evaluation, timeout performed and anesthesia consent Lidocaine  1% used for infiltration Left, radial was placed Catheter size: 20 G Hand hygiene performed  and maximum sterile barriers used   Attempts: 1 Procedure performed using ultrasound guided technique. Ultrasound Notes:anatomy identified, needle tip was noted to be adjacent to the nerve/plexus identified, no ultrasound evidence of intravascular and/or intraneural injection and image(s) printed for medical record Following insertion, dressing applied and Biopatch. Post procedure assessment: normal and unchanged  Post procedure complications: second provider assisted. Patient tolerated the procedure well with no immediate complications.

## 2024-09-24 NOTE — Progress Notes (Signed)
 SLP Cancellation Note  Patient Details Name: David King MRN: [id number] DOB: 04/11/1956   Cancelled treatment:       Reason Eval/Treat Not Completed: Patient at procedure or test/unavailable (in OR). SLP will f/u.    Damien Blumenthal, M.A., CCC-SLP Speech Language Pathology, Acute Rehabilitation Services  Secure Chat preferred [phone]  09/24/2024, 4:26 PM

## 2024-09-24 NOTE — Anesthesia Procedure Notes (Signed)
 Procedure Name: Intubation Date/Time: 09/24/2024 10:25 AM  Performed by: Christopher Comings, CRNAPre-anesthesia Checklist: Patient identified, Emergency Drugs available, Suction available and Patient being monitored Patient Re-evaluated:Patient Re-evaluated prior to induction Oxygen  Delivery Method: Circle system utilized Preoxygenation: Pre-oxygenation with 100% oxygen  Induction Type: IV induction Ventilation: Mask ventilation without difficulty and Oral airway inserted - appropriate to patient size Laryngoscope Size: Mac and 4 Grade View: Grade I Tube type: Oral Tube size: 7.5 mm Number of attempts: 1 Airway Equipment and Method: Stylet and Oral airway Placement Confirmation: ETT inserted through vocal cords under direct vision, positive ETCO2 and breath sounds checked- equal and bilateral Secured at: 23 cm Tube secured with: Tape Dental Injury: Teeth and Oropharynx as per pre-operative assessment

## 2024-09-24 NOTE — Progress Notes (Signed)
 PROGRESS NOTE   Subjective/Complaints:  Pt reports is hopeful that RLE is better with surgery- and that doesn't lose RLE Had multiple BMs last night  ROS: Per HPI    Pt denies SOB, abd pain, CP, N/V/C/D, and vision changes   (+) RLE uncontrolled pain- slightly improved with Oxycontin   Objective:   CT Angio Chest/Abd/Pel for Dissection W and/or W/WO Result Date: 09/22/2024 CLINICAL DATA:  Aortic aneurysm suspected EXAM: CT ANGIOGRAPHY CHEST, ABDOMEN AND PELVIS TECHNIQUE: Non-contrast CT of the chest was initially obtained. Multidetector CT imaging through the chest, abdomen and pelvis was performed using the standard protocol during bolus administration of intravenous contrast. Multiplanar reconstructed images and MIPs were obtained and reviewed to evaluate the vascular anatomy. RADIATION DOSE REDUCTION: This exam was performed according to the departmental dose-optimization program which includes automated exposure control, adjustment of the mA and/or kV according to patient size and/or use of iterative reconstruction technique. CONTRAST:  75mL OMNIPAQUE  IOHEXOL  350 MG/ML SOLN COMPARISON:  Chest CT February 25, 2024 CT abdomen November 19, 2022 FINDINGS: CTA CHEST FINDINGS Cardiovascular: The heart size is normal. Ascending aorta measures 3.5 cm. Pulmonary artery is normal. Atherosclerotic calcifications of coronary arteries and aorta. No pericardial fluid. Mediastinum/Nodes: No suspicious lymphadenopathy. Hiatal hernia. Normal thyroid. Lungs/Pleura: Upper lobe predominant centrilobular emphysematous changes. Lower lobe subpleural fibrotic changes and interlobular septal thickening. 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 Partially calcified left upper lobe micronodule (7/17), stable to prior. No new nodules. Peri osteophyte fibrosis in posteromedial right lower lobe. (7/85). No pleural effusion.  Musculoskeletal: Degenerative changes of the spine. Review of the MIP images confirms the above findings. CTA ABDOMEN AND PELVIS FINDINGS VASCULAR Aorta: Severe atherosclerotic calcifications. Infrarenal aneurysmal dilation of aorta measuring 3.2 x 3.7 x 6.8 cm with mural thrombus, stable to prior. Celiac: Patent without evidence of aneurysm, dissection, vasculitis or significant stenosis. SMA: Patent without evidence of aneurysm, dissection, vasculitis or significant stenosis. Renals: Both renal arteries are patent without evidence of aneurysm, dissection, vasculitis, fibromuscular dysplasia or significant stenosis. IMA: Patent without evidence of aneurysm, dissection, vasculitis or significant stenosis.Aorto bi-iliac stent graft identified. Inflow: Patent without evidence of dissection, vasculitis or significant stenosis. Veins: No obvious venous abnormality within the limitations of this arterial phase study. Review of the MIP images confirms the above findings. NON-VASCULAR Hepatobiliary: Nodular liver contour. Mild hepatic steatosis. No focal liver lesion. Gallbladder is unremarkable. No biliary dilatation. Pancreas: Unremarkable. No pancreatic ductal dilatation or surrounding inflammatory changes. Spleen: Scattered calcifications of the spleen suggestive of prior granulomatous disease. Adrenals/Urinary Tract: Multiple bilateral renal cortical nonenhancing lesions most consistent with simple cysts which does not require imaging follow-up. Few of the renal cortical lesions demonstrate calcification versus enhancing component in right inferolateral cortex (5/124) Nonobstructive nephrolithiasis measuring 1.7 cm in left kidney interpolar region, stable. Previously seen left renal pelvis nonobstructive nephrolithiasis is resolved. Bladder is decompressed and contains Foley catheter balloon and foci of air likely due to instrumentation. Stomach/Bowel: Diverticulosis without diverticulitis. Stomach is unremarkable. No  bowel obstruction. Normal appendix. Lymphatic: No suspicious lymphadenopathy. Reproductive: Status post prostatectomy. Other: No abdominal wall hernia or abnormality. No abdominopelvic ascites. Musculoskeletal: No fracture is seen. Spinal fusion  device in lower lumbar spine. Status post vertebroplasty of T7 vertebral body. Review of the MIP images confirms the above findings. IMPRESSION: Stable infrarenal aortic aneurysm.  Aorto bi-iliac stent graft. Cirrhotic liver morphology.  No focal liver lesion identified. 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 Left apical partially calcified micro nodules, stable to prior. No new nodules. No imaging follow-up required in a low risk patient. Prostatectomy without suspicious finding at the surgical bed. Electronically Signed   By: Zriyah Kopplin  Zare M.D.   On: 09/22/2024 18:54    Recent Labs    09/23/24 0507 09/24/24 0318  WBC 9.1 10.2  HGB 10.8* 10.4*  HCT 32.4* 31.0*  PLT 164 172   Recent Labs    09/22/24 0551  NA 131*  K 3.6  CL 97*  CO2 24  GLUCOSE 104*  BUN 14  CREATININE 0.88  CALCIUM  8.5*    Intake/Output Summary (Last 24 hours) at 09/24/2024 0824 Last data filed at 09/24/2024 9261 Gross per 24 hour  Intake 842.81 ml  Output 1400 ml  Net -557.19 ml     Wound 09/15/24 2032 Pressure Injury Buttocks Bilateral;Right Deep Tissue Pressure Injury - Purple or maroon localized area of discolored intact skin or blood-filled blister due to damage of underlying soft tissue from pressure and/or shear. (Active)     Wound 09/15/24 1848 Pressure Injury Heel Right Deep Tissue Pressure Injury - Purple or maroon localized area of discolored intact skin or blood-filled blister due to damage of underlying soft tissue from pressure and/or shear. (Active)    Physical Exam: Vital Signs Blood pressure 132/72, pulse 82, temperature 98.4 F (36.9 C), resp. rate 16, height 5' 9 (1.753 m), weight 83.2 kg, SpO2  93%.         General: awake, alert, appropriate, depressed affect, NAD HENT: conjugate gaze; oropharynx dry- woke him up CV: regular rate and rhythm; no JVD Pulmonary: CTA B/L; no W/R/R- good air movement GI: soft, NT, ND, (+)BS- more normoactive Psychiatric: appropriate, depressed affect Neurological: Ox3  MSK- barely touched R ankle/foot and pt was screaming-  Extremities: legs more pale than expected- no pulses- 2-3+ RLE edema to distal calf- some mild redness on RLE Skin- has deep indentation on R ankle from sock as well as a long horizontal blister that's thin likely from sock-  Neurological:     Comments: Patient is alert oriented x 4 following commands. Right lower extremity strength is 4/5 proximally and 2/5 distally, LLE 4/5 throughout, decreased sensation in right foot  Prior neuro assessment is c/w today's exam 09/24/2024.  9/17 image   Image 9/21   Assessment/Plan: 1. Functional deficits which require 3+ hours per day of interdisciplinary therapy in a comprehensive inpatient rehab setting. Physiatrist is providing close team supervision and 24 hour management of active medical problems listed below. Physiatrist and rehab team continue to assess barriers to discharge/monitor patient progress toward functional and medical goals  Care Tool:  Bathing    Body parts bathed by patient: Right arm, Left arm, Chest, Abdomen, Front perineal area, Right upper leg, Left upper leg, Face   Body parts bathed by helper: Buttocks, Left lower leg, Right lower leg     Bathing assist Assist Level: Minimal Assistance - Patient > 75%     Upper Body Dressing/Undressing Upper body dressing        Upper body assist Assist Level: Set up assist    Lower Body Dressing/Undressing Lower body dressing  Lower body assist Assist for lower body dressing: Moderate Assistance - Patient 50 - 74%     Toileting Toileting Toileting Activity did not occur Chief Financial Officer and hygiene only): Refused  Toileting assist Assist for toileting: Moderate Assistance - Patient 50 - 74%     Transfers Chair/bed transfer  Transfers assist  Chair/bed transfer activity did not occur: Safety/medical concerns  Chair/bed transfer assist level: Supervision/Verbal cueing     Locomotion Ambulation   Ambulation assist      Assist level: 2 helpers Assistive device: Walker-rolling Max distance: 61'   Walk 10 feet activity   Assist     Assist level: 2 helpers Assistive device: Walker-rolling   Walk 50 feet activity   Assist Walk 50 feet with 2 turns activity did not occur: Safety/medical concerns (fatigue, unsafe)         Walk 150 feet activity   Assist Walk 150 feet activity did not occur: Safety/medical concerns (fatigue, unsafe)         Walk 10 feet on uneven surface  activity   Assist Walk 10 feet on uneven surfaces activity did not occur: Safety/medical concerns (fatigue, unsafe)         Wheelchair     Assist Is the patient using a wheelchair?: Yes Type of Wheelchair: Manual    Wheelchair assist level: Supervision/Verbal cueing Max wheelchair distance: 255'    Wheelchair 50 feet with 2 turns activity    Assist        Assist Level: Supervision/Verbal cueing   Wheelchair 150 feet activity     Assist      Assist Level: Supervision/Verbal cueing   Blood pressure 132/72, pulse 82, temperature 98.4 F (36.9 C), resp. rate 16, height 5' 9 (1.753 m), weight 83.2 kg, SpO2 93%.  Medical Problem List and Plan: 1. 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  Status post L4-5 transforaminal interbody fusion/posterior lateral fusion insertion interbody device L4-5 decompression 09/08/2024 per Dr. Beuford.  Back brace when out of bed             -patient may shower             -ELOS/Goals: 10-14 days             Going to acute today for surgery 2.   Antithrombotics: -DVT/anticoagulation:  Mechanical: Antiembolism stockings, thigh (TED hose) Bilateral lower extremities.  Check vascular study 9/23- might be put on Heparin  gtt today- so wait to start Lovenox  9/25- on Heparin  gtt per Vascular/ordered- pharmacy managing             -antiplatelet therapy: N/A 3. Pain Management: Neurontin  600 mg 3 times daily, Robaxin  and oxycodone  as needed  9/18- Gave a 1x dose of Oxy 15 mg, Increased his Oxy to 10-15 mg q4 hours prn for pain; changed Robaxin  to 1000 mg QID, increased Gabapentin  to 900 mg TID.Also added Lidoderm   for nerve pain on RLE.    -9/19 patient reports continued pain in his leg, improved with getting up.  Does not appear to be getting oxycodone  frequently today.  Continue current regimen and monitor.  Appears more comfortable today compared to description yesterday  9/20 will change gabapentin  to Lyrica  3 times daily 150 mg for neuropathic pain  9/21  lidocaine  patch R skin, occasional use of oxycodone    9/22- will start Oxycontin  10 mg BID for the next 1 week to see if that will help pain.   9/23- pain slightly better,  but still a major issue  9/24- pt reports pain is barely tolerable but that' sbetter than when we started- d/w pt  9/25- pain is  stable, barely tolerable.  4. Anxiety: would benefit from neuropsych eval, magnesium  supplement started.  Ambien  as needed  9/18- Will start Duloxetine  for nerve pain 30 mg at bedtime  9/19 may take several days for duloxetine  to take effect  9/24- will add Celexa - I know he's on Duloxetine  for nerve pain, but not as good of SNRI for mood- so will combo them- also obviously he's having grief reaction ot possibly losing RLE             -antipsychotic agents: N/A 5. Neuropsych/cognition: This patient is capable of making decisions on his own behalf. 6. Tachycardia: magnesium  supplement started   -9/20-21 HR in the 80s and 90s continue to monitor  9/24- doing well    09/24/2024    5:59 AM  09/23/2024    7:38 PM 09/23/2024    4:07 PM  Vitals with BMI  Systolic 132 127 882  Diastolic 72 72 70  Pulse 82 91 78    7. Fluids/Electrolytes/Nutrition: Routine in and outs with follow-up chemistries.   8.  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  Patient responded to epi drip and Decadron .   9.  Hyperlipidemia.  Pravachol    10.  COPD.  Remote tobacco use.  Continue inhalers as directed.  Check oxygen  saturations every shift   11.  History of prostate cancer.  Status post prostatectomy.  Continue Flomax  0.4 mg daily.   12.  History of detached retina left eye.  Follow-up outpatient ophthalmology   13. Transaminitis  9/18- AST 125 and ALT 161- will stop Pravachol  20 mg daily and decrease Tylenol  to 325 mg q6 hours prn from 650 mg. Last LFTs were normal 7-8 days ago.   9/23- will restart Pravachol  at lower dose- 10 mg daily and recheck labs in AM  9/24- AST 147 and ALT down to 87- so AST went up and ALT went down some- could be lack of blood flow? 14. Azotemia  9/18- BUN up to 26 from 12- will push fluids and recheck in AM.   9/19 BUN decreased to 20, continue encourage oral fluids, recheck tomorrow  9/20 BUN and creatinine improved today, continue current  9/22- Cr 0.87 and BUN 18- doing better  9/25- Cr 0.88 and BUN 14- doing better 15. Mild Leukocytosis  9/18- will recheck in AM- has had decadron - no Sx's of UTI/URI.   9/19 WBC a little higher today, may be due to recent Decadron , no signs of infection  9/20 no signs infection noted continue to monitor  9/22- No signs of infection- WBC stable   9/25- WBC down to 9.1k 16. R heel DTI/lack of blood flow to RLE  9/18- Prevalon ordered and pt has WOC consult ordered  Patient was seen by wound VAC for deep pressure injury, appreciate assistance and recommendations  9/21 also has some brusing on R toe, appears similar/ a little improve to prior  image. RLE edema VAS US  neg couple days ago . Spoke with vascular,  will order ABI and VAS U/S R, appreciate assistance  9/22- Arterial duplex ordered for RLE this weekend- is pending- ordered ASA 81 mg per vascular- cannot order Statin because pt's LFTs significantly elevated- will try to restart if LFT's have improved this week  9/23- restarted low dose Pravachol   -CTA today- Vascular involved  9/24- is blocked from  mid aorta down per vascular- and needs surgery to fix- is leaving for Bifemoral aorto bypass with fem-pop on RLE-getting CT today per vascular and putting on Heparin  gtt  9/26- getting some redness on RLE- needs surgery today 17. Loose Bms/constipation  -hold miralax   -9/21 LBM yesterday, monitor   9/23- having tiny Bms per pt- will order Sorbitol  today at 1pm- if has CTA by then- if not, will give sorbitol  tomorrow  9/25- got sorbitol  yesterday- no results- will give Mg citrate 1/2 bottle at 11am and if no results, other 1/2 at 4pm  9/26- had multiple BM's yesterday 18. Hyponatremia  -9/21 130 today, add salt tabs  9/22- Na 132 19. Urinary retention  9/22- started Flomax  last week, but changed to q supper- also will replace Foley- he's having a lot of pain with In/out caths and asked for foley to be replaced.   9/23- foley back in-  20. R ankle blister  9/25- took sock off- don't want to place TEDs opr ACE wraps due to lack of blood flow-   I spent a total of  34  minutes on total care today- >50% coordination of care- due to  D/w pt about his plan- surgery, and then acute for 3-4 days then hopefully get him back  LOS: 9 days A FACE TO FACE EVALUATION WAS PERFORMED  Dearius Hoffmann 09/24/2024, 8:24 AM

## 2024-09-24 NOTE — H&P (Signed)
 [facility] H&P   Patient Demographics:    David King, is a 68 y.o. male  MRN: [id number]   DOB - 24-Dec-1956  Admit Date - 09/24/2024  Outpatient Primary MD for the patient is [name], MD  Outpatient Specialists: Back surgeon Dr. Beuford, vascular surgeon Dr. Gari and Dr. Serene  Patient coming from: PACU  Chief complaint.  Bilateral lower extremity weakness right more than left     HPI:    David King  is a 68 y.o. male, with history of Lumbar radiculopathy, chronic pain, Anaphylactic reaction during lumbar fusion with hypotension dyslipidemia, COPD, prostate cancer, detached retina of the left eye, PAD with stenting of bilateral common iliac arteries who was initially admitted to the hospital 2 weeks ago for L4-L5 disc disease with left lower extremity pain and weakness, he underwent L4-L5 t decompression and fusion on 09/08/2024 by Dr. Beuford.    During the L-spine surgery on 09/08/2024 patient had an anaphylactic reaction and developed hives with some hypotension. He required short-term intubation and transferred to ICU, post recovery he was noted to have some improvement in his left leg pain, postop site looked okay but he developed some right lower extremity weakness.  He was added to the inpatient rehab where he was seen by neurology underwent MRI brain which was unremarkable, EMG showed some nonspecific changes.  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  He was subsequently taken to the OR  on 09/24/2024 for right axillary femoral bypass graft, bilateral common femoral, superficial femoral and profundofemoral thrombectomy, right leg anterior compartment fasciotomy due to compartment syndrome, right femoral endarterectomy.  I was called in the PACU to admit the patient postop, soon after coming out of the OR he was found to have decrease in his right lower extremity pulses again and the plan is to take him back to the OR for further tests and intervention if needed.  Currently in PACU patient is slightly somnolent but denies any headache, chest or abdominal pain, no shortness of breath, no problems with his breathing, confirms bilateral lower extremity weakness right more than left, no other complaints.    Review of systems:  A full 10 point Review of Systems was done, except as stated above, all other Review of Systems were negative.   With Past History of the following :    Past Medical History:  Diagnosis Date   Anxiety    new dx   Arthritis    lumbar   Atherosclerotic vascular disease    Cataract    COPD (chronic obstructive pulmonary disease) (HCC)    Diverticulosis    Elevated PSA    Headache(784.0)    MIGRAINES   Hypertension 2021   Iritis    CHRONIC IN LEFT EYE - SOME VISIAL IMPAIRMENT IN LEFT EYE   Neuromuscular disorder (HCC)    left leg/foot,pinched siactic nerve   Pain    PAIN LEFT HIP AND DOWN LT LEG WITH NUMBNESS IN LEFT LEG--PT STATES SCIATIC NERVE IMPINGEMENT - PT PLANS BACK IN THE NEAR SURGERY.   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   Renal cysts, acquired, bilateral 03/19/2013   several simple , CT   Urinary frequency    AND NOCTURIA      Past Surgical History:  Procedure Laterality Date   ABDOMINAL AORTOGRAM W/LOWER EXTREMITY N/A 09/21/2024   Procedure: ABDOMINAL AORTOGRAM W/LOWER EXTREMITY;  Surgeon: Serene Gaile ORN, MD;  Location: [facility];  Service: Cardiovascular;  Laterality:  N/A;   BACK SURGERY     CARDIAC CATHETERIZATION  03/10/2018   CATARACT EXTRACTION W/ INTRAOCULAR LENS IMPLANT Left    EYE SURGERY     RETINAL SURGERY LEFT EYE   HYDROCELE EXCISION Left 03/05/2013   Procedure: HYDROCELECTOMY ADULT;  Surgeon: Donnice Gwenyth Brooks, MD;  Location: [facility];  Service: Urology;  Laterality: Left;   INCISIONAL HERNIA REPAIR N/A 06/18/2022   Procedure: REPAIR OF INTERNAL HERNIA;  Surgeon: Rubin Calamity, MD;  Location: [facility] OR;  Service: General;  Laterality: N/A;   INGUINAL HERNIA REPAIR Bilateral AGE 30   INSERTION OF MESH N/A 06/11/2022   Procedure: INSERTION OF MESH;  Surgeon: Rubin Calamity, MD;  Location: [facility] OR;  Service: General;  Laterality: N/A;   INSERTION OF MESH N/A 06/18/2022   Procedure: INSERTION OF MESH;  Surgeon: Rubin Calamity, MD;  Location: [facility] OR;  Service: General;  Laterality: N/A;   KYPHOPLASTY N/A 05/08/2023   Procedure: THORACIC SEVEN KYPHOPLASTY;  Surgeon: Beuford Anes, MD;  Location: [facility] OR;  Service: Orthopedics;  Laterality: N/A;   LAPAROSCOPY N/A 06/18/2022   Procedure: LAPAROSCOPY DIAGNOSTIC;  Surgeon: Rubin Calamity, MD;  Location: [facility] OR;  Service: General;  Laterality: N/A;   LYMPHADENECTOMY Bilateral 05/06/2013   Procedure: REDGIE;  Surgeon: Noretta Ferrara, MD;  Location: [facility];  Service: Urology;  Laterality: Bilateral;   PROSTATE BIOPSY N/A 03/05/2013   Procedure: PROSTATE BIOPSY and ultrasound;  Surgeon: Donnice Gwenyth Brooks, MD;  Location: [facility];  Service: Urology;  Laterality: N/A;   REMOVAL BURSA SAC, LEFT ELBOW  1996   ROBOT ASSISTED LAPAROSCOPIC RADICAL PROSTATECTOMY N/A 05/06/2013   Procedure: ROBOTIC ASSISTED LAPAROSCOPIC RADICAL PROSTATECTOMY LEVEL 3;  Surgeon: Noretta Ferrara, MD;  Location: [facility];  Service: Urology;  Laterality: N/A;   TONSILLECTOMY     as achild   TRANSFORAMINAL LUMBAR INTERBODY FUSION (TLIF) WITH PEDICLE SCREW FIXATION 1 LEVEL Left 09/08/2024    Procedure: LEFT-SIDED LUMBAR 4- LUMBAR 5 TRANSFORAMINAL LUMBAR INTERBODY FUSION AND DECOMPRESSION WITH INSTRUMENTATION AND ALLOGRAFT;  Surgeon: Beuford Anes, MD;  Location: [facility] OR;  Service: Orthopedics;  Laterality: Left;  LEFT-SIDED LUMBAR  4- LUMBAR 5 TRANSFORAMINAL LUMBAR INTERBODY FUSION AND DECOMPRESSION WITH INSTRUMENTATION AND ALLOGRAFT   XI ROBOTIC ASSISTED VENTRAL HERNIA N/A 06/11/2022   Procedure: ROBOTIC INCISIONAL HERNIA REPAIR WITH MESH;  Surgeon: Rubin Calamity, MD;  Location: [facility] OR;  Service: General;  Laterality: N/A;      Social History:     Social History   Tobacco Use   Smoking status: Former    Current packs/day: 0.00    Average packs/day: 2.0 packs/day for 47.0 years (94.0 ttl pk-yrs)    Types: Cigarettes    Start date: 12/31/1969    Quit date: 12/31/2016    Years since quitting: 7.7   Smokeless tobacco: Never   Tobacco comments:    Patient is currently smoke free since January 2018  Substance Use Topics   Alcohol use: Yes    Alcohol/week: 14.0 standard drinks of alcohol    Types: 14 Shots of liquor per week    Comment: couple of drinks a night        Family History :     Family History  Problem Relation Age of Onset   Cancer Father        prostate cancer   Cancer Paternal Grandfather        prostate   Colon cancer Neg Hx    Colon polyps Neg Hx    Esophageal cancer Neg Hx    Rectal cancer Neg Hx    Stomach cancer Neg Hx       Home Medications:   Prior to Admission medications   Medication Sig Start Date End Date Taking? Authorizing Provider  acetaminophen  (TYLENOL ) 325 MG tablet Take 1 tablet (325 mg total) by mouth every 4 (four) hours as needed for mild pain (pain score 1-3) or fever (or temp > 100.5). 09/22/24   Angiulli, Toribio PARAS, PA-C  albuterol  (PROVENTIL ) (2.5 MG/3ML) 0.083% nebulizer solution Inhale 3 mLs into the lungs every 6 (six) hours as needed for wheezing or shortness of breath. 09/22/24   Angiulli, Toribio PARAS, PA-C  ascorbic acid   (VITAMIN C ) 1000 MG tablet Take 1 tablet (1,000 mg total) by mouth daily. 09/22/24   Angiulli, Toribio PARAS, PA-C  aspirin  EC 81 MG tablet Take 1 tablet (81 mg total) by mouth daily. Swallow whole. 09/22/24   Angiulli, Toribio PARAS, PA-C  bisacodyl  (DULCOLAX) 5 MG EC tablet Take 1 tablet (5 mg total) by mouth daily as needed for moderate constipation. 09/22/24   Angiulli, Toribio PARAS, PA-C  cholecalciferol (VITAMIN D3) 25 MCG (1000 UNIT) tablet Take 1,000 Units by mouth daily.    [provider]  citalopram  (CELEXA ) 20 MG tablet Take 1 tablet (20 mg total) by mouth daily. 09/23/24   Angiulli, Toribio PARAS, PA-C  docusate sodium  (COLACE) 100 MG capsule Take 1 capsule (100 mg total) by mouth 2 (two) times daily. 09/22/24   Angiulli, Toribio PARAS, PA-C  DULoxetine  (CYMBALTA ) 30 MG capsule Take 1 capsule (30 mg total) by mouth at bedtime. 09/22/24   Angiulli, Toribio PARAS, PA-C  heparin  25000 UT/250ML infusion Inject 1,500 Units/hr into the vein continuous. 09/23/24   Angiulli, Toribio PARAS, PA-C  lidocaine  (LIDODERM ) 5 % Place 2 patches onto the skin daily. Remove & Discard patch within 12 hours or as directed by MD 09/22/24   Angiulli, Toribio PARAS, PA-C  magnesium  gluconate (MAGONATE) 500 (27 Mg) MG TABS tablet Take 0.5 tablets (250 mg total) by mouth at bedtime. 09/22/24   Angiulli, Toribio PARAS, PA-C  methocarbamol  1000 MG TABS Take 1,000  mg by mouth 4 (four) times daily. 09/22/24   Angiulli, Toribio PARAS, PA-C  ondansetron  (ZOFRAN ) 4 MG tablet Take 1 tablet (4 mg total) by mouth every 6 (six) hours as needed for nausea or vomiting. 09/22/24   Angiulli, Toribio PARAS, PA-C  oxyCODONE  (OXYCONTIN ) 10 mg 12 hr tablet Take 1 tablet (10 mg total) by mouth every 12 (twelve) hours. 09/22/24   Angiulli, Toribio PARAS, PA-C  oxyCODONE  (ROXICODONE ) 15 MG immediate release tablet Take 1 tablet (15 mg total) by mouth every 4 (four) hours as needed for severe pain (pain score 7-10). 09/22/24   Angiulli, Toribio PARAS, PA-C  Oxycodone  HCl 10 MG TABS Take 1 tablet (10 mg  total) by mouth every 4 (four) hours as needed for moderate pain (pain score 4-6). 09/22/24   Angiulli, Toribio PARAS, PA-C  pravastatin  (PRAVACHOL ) 10 MG tablet Take 1 tablet (10 mg total) by mouth daily at 6 PM. 09/22/24   Angiulli, Toribio PARAS, PA-C  pregabalin  (LYRICA ) 150 MG capsule Take 1 capsule (150 mg total) by mouth 3 (three) times daily. 09/22/24   Angiulli, Toribio PARAS, PA-C  rosuvastatin  (CRESTOR ) 20 MG tablet Take 1 tablet (20 mg total) by mouth daily. 09/23/24   Angiulli, Toribio PARAS, PA-C  senna-docusate (SENOKOT-S) 8.6-50 MG tablet Take 1 tablet by mouth at bedtime as needed for mild constipation. 09/22/24   Angiulli, Toribio PARAS, PA-C  sodium chloride  1 g tablet Take 1 tablet (1 g total) by mouth 2 (two) times daily with a meal. 09/22/24   Angiulli, Toribio PARAS, PA-C  sorbitol  70 % SOLN Take 30 mLs by mouth as needed for moderate constipation (severe constipation). 09/23/24   Angiulli, Toribio PARAS, PA-C  tamsulosin  (FLOMAX ) 0.4 MG CAPS capsule Take 1 capsule (0.4 mg total) by mouth daily after supper. 09/22/24   Angiulli, Toribio PARAS, PA-C  zinc  sulfate, 50mg  elemental zinc , 220 (50 Zn) MG capsule Take 1 capsule (220 mg total) by mouth daily. 09/22/24   Angiulli, Toribio PARAS, PA-C  zolpidem  (AMBIEN ) 5 MG tablet Take 1 tablet (5 mg total) by mouth at bedtime as needed for sleep. 09/22/24   Angiulli, Toribio PARAS, PA-C     Allergies:     Allergies  Allergen Reactions   Ancef  [Cefazolin ] Anaphylaxis     Physical Exam:   Vitals  Blood pressure 90/69, pulse 81, temperature (!) 96.2 F (35.7 C), resp. rate 11, SpO2 95%.   1. General Middle-aged Caucasian male lying in PACU bed in no apparent discomfort,  2. Normal affect and insight, Not Suicidal or Homicidal, Awake mildly confused in the PACU  3. No F.N deficits, ALL C.Nerves Intact, Strength 5/5 both upper extremities, left lower extremity slightly weak, right lower extremity no movement due to recent anterior compartment muscle removal.  4. Ears and Eyes  appear Normal, Conjunctivae clear, PERRLA. Moist Oral Mucosa.  5. Supple Neck, No JVD, No cervical lymphadenopathy appriciated, No Carotid Bruits.  6. Symmetrical Chest wall movement, Good air movement bilaterally, CTAB.  7. RRR, No Gallops, Rubs or Murmurs, No Parasternal Heave.  8. Positive Bowel Sounds, Abdomen Soft, No tenderness, No organomegaly appriciated,No rebound -guarding or rigidity.  Foley catheter in place  9.  No Cyanosis, Normal Skin Turgor, No Skin Rash or Bruise.  10.  Bilateral lower extremity weakness moves left lower extremity but does not move his right lower extremity likely due to fasciotomy and multiple muscle removal in the anterior compartment of the right leg  11. No Palpable Lymph Nodes in Neck  or Axillae      Data Review:   Recent Labs  Lab 09/18/24 1603 09/19/24 1312 09/23/24 0507 09/24/24 0318  WBC 11.7* 11.1* 9.1 10.2  HGB 13.0 12.5* 10.8* 10.4*  HCT 37.7* 37.0* 32.4* 31.0*  PLT 290 262 164 172  MCV 96.2 96.6 98.2 98.1  MCH 33.2 32.6 32.7 32.9  MCHC 34.5 33.8 33.3 33.5  RDW 13.2 13.2 13.2 13.2  LYMPHSABS  --   --  1.1  --   MONOABS  --   --  0.8  --   EOSABS  --   --  0.1  --   BASOSABS  --   --  0.0  --     Recent Labs  Lab 09/18/24 1233 09/19/24 1312 09/22/24 0551  NA 128* 130* 131*  K 4.0 4.0 3.6  CL 95* 95* 97*  CO2 20* 25 24  ANIONGAP 13 10 10   GLUCOSE 104* 139* 104*  BUN 19 18 14   CREATININE 0.72 0.87 0.88  AST  --   --  147*  ALT  --   --  87*  ALKPHOS  --   --  69  BILITOT  --   --  1.2  ALBUMIN   --   --  2.7*  CALCIUM  8.8* 9.0 8.5*    Lab Results  Component Value Date   CHOL 133 09/22/2024   HDL 26 (L) 09/22/2024   LDLCALC 95 09/22/2024   TRIG 60 09/22/2024   CHOLHDL 5.1 09/22/2024    Recent Labs  Lab 09/18/24 1233 09/19/24 1312 09/22/24 0551  CALCIUM  8.8* 9.0 8.5*    Recent Labs  Lab 09/18/24 1233 09/18/24 1603 09/19/24 1312 09/22/24 0551 09/23/24 0507 09/24/24 0318  WBC  --  11.7* 11.1*   --  9.1 10.2  PLT  --  290 262  --  164 172  CREATININE 0.72  --  0.87 0.88  --   --     Urinalysis    Component Value Date/Time   COLORURINE YELLOW 06/13/2022 2335   APPEARANCEUR HAZY (A) 06/13/2022 2335   LABSPEC 1.038 (H) 06/13/2022 2335   PHURINE 5.0 06/13/2022 2335   GLUCOSEU NEGATIVE 06/13/2022 2335   HGBUR MODERATE (A) 06/13/2022 2335   BILIRUBINUR NEGATIVE 06/13/2022 2335   KETONESUR NEGATIVE 06/13/2022 2335   PROTEINUR NEGATIVE 06/13/2022 2335   NITRITE NEGATIVE 06/13/2022 2335   LEUKOCYTESUR NEGATIVE 06/13/2022 2335      Imaging Results:    HYBRID OR IMAGING ([facility] ONLY) Result Date: 09/24/2024 There is no interpretation for this exam.  This order is for images obtained during a surgical procedure.  Please See Surgeries Tab for more information regarding the procedure.   HYBRID OR IMAGING ([facility] ONLY) Result Date: 09/24/2024 There is no interpretation for this exam.  This order is for images obtained during a surgical procedure.  Please See Surgeries Tab for more information regarding the procedure.     Assessment & Plan:    1. 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  He was subsequently taken to the OR on 09/24/2024 for right axillary femoral bypass graft, bilateral common femoral, superficial femoral and profundofemoral thrombectomy, right leg anterior compartment fasciotomy due to compartment syndrome, right femoral endarterectomy.  His right lower extremity pulses are not satisfactory per Dr. Serene postop, will be going back to the OR for angiogram and further intervention  as needed, postsurgery he will be admitted to progressive care bed, heparin  drip for now will be ordered by [facility], from tomorrow will start PT OT, continue wound care.  2.  History of PAD with stenting.  Kindly see above  for now continue aspirin  and statin, heparin  drip as above.  Supportive care.  3.  History of prostate cancer.  Currently on Flomax  continue.  4.  COPD.  No acute issues.  Good air movement bilaterally, no wheezing, supportive care, oxygen  as needed.  Has quit smoking several years ago.  5.  Dyslipidemia.  Home dose statin.  6.  History of L4-L5 fusion and chronic pain.  Pain control, PT OT and monitor.   Patient's wife is [place]  was updated by me.   DVT Prophylaxis Heparin  per vascular surgery  AM Labs Ordered, also please review Full Orders  Family Communication: None present in PACU  Code Status full code  Condition GUARDED    Consults called: VVS    Admission status: INPT    Time spent in minutes : 45  Signature  -    Lavada Stank M.D on 09/24/2024 at 4:17 PM   -  To page go to [email or web address]

## 2024-09-24 NOTE — Interval H&P Note (Signed)
 History and Physical Interval Note:  09/24/2024 9:38 AM  David King  has presented today for surgery, with the diagnosis of CLI RLE with rest pain.  The various methods of treatment have been discussed with the patient and family. After consideration of risks, benefits and other options for treatment, the patient has consented to  Procedure(s): CREATION, BYPASS, ARTERIAL, AXILLARY TO BILATERAL FEMORAL, USING GRAFT (Right) BYPASS GRAFT FEMORAL-POPLITEAL ARTERY (Right) as a surgical intervention.  The patient's history has been reviewed, patient examined, no change in status, stable for surgery.  I have reviewed the patient's chart and labs.  Questions were answered to the patient's satisfaction.     David King

## 2024-09-24 NOTE — Op Note (Signed)
    Patient name: David King MRN: [id number] DOB: 27-May-1956 Sex: male  09/24/2024 Pre-operative Diagnosis: Right leg ischemia Post-operative diagnosis:  Same Surgeon:  [name] Assistants:  Medford Gaskins, CHRISTELLA Holster Procedure:   #1: Right leg angiogram   #2: Open exposure of right popliteal artery   #3: Right popliteal thrombectomy   #4: Right tibial thrombectomy Anesthesia:  General Blood Loss:  minimal Specimens:  none  Findings:  thrombus in the popliteal artery successfully removed  Indications: This is a 68 year old gentleman who underwent right axillary bifemoral bypass graft earlier today.  In the PACU his popliteal signal was different than what it was in the operating room.  It was monophasic.  I felt that given his initial presentation and the signals that we left the operating room with, he needed to go back for exploration angiography to make sure his blood flow is optimized.  I discussed this with the patient as well as his wife via telephone.  Procedure:  The patient was identified in the holding area and taken to [facility] OR ROOM 16  The patient was then placed supine on the table. general anesthesia was administered.  The patient was prepped and draped in the usual sterile fashion.  A time out was called and antibiotics were administered.  Due to the complexity of the case, an experienced assistant was necessary to expedite the procedure.  The assistance helped with exposure, the anastomosis, and closure.  The right groin incision was reopened by removing the sutures.  I exposed the common femoral and superficial femoral artery.  The superficial femoral artery was cannulated with a micropuncture needle.  An 018 wire was inserted without resistance followed by placement of a micropuncture sheath.  Contrast injections were performed with the catheter in the right superficial femoral artery.  Angiography revealed a filling defect in the popliteal artery at the level of the knee.   There was reconstitution of the posterior tibial artery which was dominant runoff.  Peroneal artery also opacified.  I elected to reopen the medial right below-knee incision.  I entered the popliteal space and dissected out the popliteal artery.  Once I had control, the patient was fully heparinized.  After the heparin  circulated I made a transverse arteriotomy in the popliteal artery.  I inserted a #3 Fogarty catheter and performed thrombectomy of the popliteal artery.  I evacuated a thrombus plug and immediately had excellent inflow.  I repeated thrombectomy and did not receive get any further clot.  I then passed the Fogarty catheter distally and did not evacuate any thrombus.  I then closed the arteriotomy transversely with 6-0 Prolene.  After closure of the artery, there was a triphasic posterior tibial Doppler signal.  I was satisfied with these results.  I closed the below-knee incision by reapproximating the gastroc muscle to the fascia.  The skin was then stapled closed.  The groin incision was closed by removing the sheath and closed the arteriotomy with a 6-0 Prolene.  The deep tissue was reapproximated over the graft and artery with 2-0 Vicryl.  The subcutaneous tissue was closed with 3-0 Vicryl followed by subcu closure and Dermabond.  There were no immediate complications.  The patient continued to have a brisk triphasic posterior tibial Doppler signal.  He was successfully extubated and taken recovery in stable condition.   Disposition: To PACU stable.   ALONSO [name], M.D., [facility] [place] Office: [phone] Pager:  [phone]

## 2024-09-24 NOTE — Op Note (Signed)
 Patient name: David King MRN: [id number] DOB: 1956-07-11 Sex: male  09/24/2024 Pre-operative Diagnosis: aortic occlusion Post-operative diagnosis:  Same Surgeon:  [name] Assistants:  Medford Gaskins, Cheryle Rim, [place] Procedure:   #1: Right axillary bifemoral bypass graft   #2: Bilateral common femoral, superficial femoral, and profundofemoral thrombectomy   #3: Redo left common femoral artery exposure   #4: 4 compartment right leg fasciotomy   #5: Resection of necrotic muscle in the right anterior compartment   #6: Bilateral femoral endarterectomy Anesthesia:  general Blood Loss:  250 Specimens:  none  Findings: Bilateral common femoral arteries had acute/subacute thrombus.  Thrombectomy of the right leg was able to be performed, hugging the Fogarty catheter.  Subacute and acute thrombus was evacuated.  I could only advance the Fogarty catheter down to the knee on the left leg which was consistent with his angiographic findings.  Fasciotomy was performed of the right leg.  The entire anterior compartment muscle was dead and resected.  He had a brisk posterior tibial Doppler signal bilaterally  Indications: This is a 68 year old gentleman who was found to have aortic occlusion on angiography.  He has been complaining of right leg pain for approximately 2 to 3 weeks.  He comes in today for revascularization.    Procedure:  The patient was identified in the holding area and taken to [facility] OR ROOM 16  The patient was then placed supine on the table. general anesthesia was administered.  The patient was prepped and draped in the usual sterile fashion.  A time out was called and antibiotics were administered.  An experienced assistant was necessary due to the complexity of the case.  The assistance helped with exposure by providing suction and retraction, following suture for the anastomosis, and wound closure.  A transverse incision was made below the right clavicle.  Cautery was used  divide subcutaneous tissue down to the pectoral fascia which was opened with cautery.  I then separated the pectoralis muscle fibers and identified the subclavian vein.  Multiple superior branches were ligated.  I then exposed the subclavian artery which was deep within the incision and posterior to the clavicle.  It was fully mobilized.  I ligated the vessels off of the thyrocervical trunk.  This was a disease-free artery.  Next a longitudinal incisions were made in each groin.  The left was a redo exposure.  Both common femoral arteries were dissected out as were the profunda and superficial femoral arteries.  Previous patch was noted in the left groin.  Next, a tunnel was created from the right axillary cyst into the right groin with an 8 mm tunnel.  This went posterior to the pectoralis minor muscle and medial to the right iliac crest.  An 8 mm external ring PTFE graft was brought through this tunneler.  I then created a tunnel between the 2 groin incisions in the suprapubic fashion.  At this point the patient was fully heparinized.  Once the heparin  circulated, the right axillary artery was occluded with vascular clamps and a #11 blade was used to make an arteriotomy which was extended longitudinally with Potts scissors.  The graft was beveled to fit the size the arteriotomy and a running anastomosis was created with 6-0 Prolene.  Prior to completion, the appropriate flushing maneuvers were performed and the anastomosis was completed.  There was excellent flow through the graft.  The graft was then reoccluded and flushed with heparin  saline.  The right femoral vessels were  occluded and a #11 blade was used to make an arteriotomy with Potts scissors in a longitudinal fashion.  I opened the right common femoral artery onto the superficial femoral artery for about 1.5 cm.  There was acute and subacute thrombus present which was removed.  There was extensive calcific plaque which necessitated a endarterectomy.   A eversion endarterectomy of the profunda was performed.  I was able to get a good endpoint in the superficial femoral artery and then had to tack down the plaque with 7-0 Prolene suture.  Next, I passed a #4 Fogarty catheter down the profundofemoral artery and did not get any thrombus back.  I then advanced it down the superficial femoral-popliteal artery.  I was able to help the catheter.  I evacuated subacute thrombus.  Multiple passes were made until I got a negative pass.  Because I had open the common femoral artery for a relatively long length, I elected to partially close the arteriotomy with running 6-0 Prolene in 2 layers.  I then spatulated the PTFE graft and performed a end-to-side anastomosis to the distal common femoral artery extending onto the superficial femoral artery for approximately 1 cm.  Prior to completion, the appropriate flushing maneuvers were performed and the anastomosis was completed.  Next, the left femoral artery was occluded with vascular clamps and the previous patch repair was opened with a #11 blade was extended longitudinally with Potts scissors.  There was neointimal and subacute thrombus as well as fresh thrombus present which was able to be evacuated with DeBakey forceps.  Thrombectomy was then performed of the profunda and superficial femoral artery.  I was only able to pass the Fogarty down to about the knee on the left side which was consistent with his preoperative angiogram.  There was good backbleeding from the superficial femoral profundofemoral artery.  I then tunneled a 8 mm ringed PTFE graft between each groin.  Dr. Lanis performed the anastomosis in the left groin.  This was an end to side anastomosis to the patch on the common femoral artery with 6-0 Prolene.  On the right side I opened the hood of the graft with an 11 blade and extended it with a #5 punch.  This anastomosis was performed with 6-0 Prolene.  Prior to completion, the appropriate flushing maneuvers  were performed and the anastomosis was completed.  The patient had brisk Doppler signals in the profunda and superficial femoral artery bilaterally.  Attention was then turned towards the right leg.  A medial below the knee incision was made with a 10 blade.  Cautery was used divide the subcutaneous tissue.  Identified the saphenous vein and protected it.  The fascia was opened with cautery.  The gastrocnemius and soleus muscle responded well to cautery and were pink in color.  I then opened the deep compartment by taking the soleus off the posterior edge of the tibia.  The deep compartment muscles were also viable.  We then performed a lateral fasciotomy incision.  The lateral compartment muscles were all viable and responsive to cautery.  All muscle within the anterior compartment was dead.  It did not respond to cautery and was gray in color with edema fluid throughout.  I felt that the only option was to resect this muscle which was done from the ankle up to the knee.  The neurovascular bundle was left in place.  At this point we had brisk signals in both posterior tibial arteries.  I did not feel the need to  proceed with any additional revascularization either bypass or thrombectomy.  The heparin  was reversed with 50 mg of protamine .  The wounds were irrigated.  Hemostasis was achieved.  The groins were closed by reapproximating the femoral sheath with 2-0 Vicryl, the subcutaneous tissue with 3-0 Vicryl and the skin with 4-0 Monocryl.  Similarly the infraclavicular incision was closed by reapproximating the fascia with 2-0 Vicryl and the skin with 4-0 Vicryl.  The medial below-knee incision was closed with a stapler.  I did not close the fascia.  The lateral incision was packed with Kerlix.  The patient tolerated procedure well was taken the recovery in stable condition.   Disposition: To PACU stable.   ALONSO [name], M.D., [facility] [place] Office:  [phone] Pager:  [phone]

## 2024-09-24 NOTE — Anesthesia Preprocedure Evaluation (Signed)
 Anesthesia Evaluation  Patient identified by MRN, date of birth, ID band Patient awake and Patient confused    Reviewed: Allergy  & Precautions, NPO status , Patient's Chart, lab work & pertinent test results  Airway Mallampati: II  TM Distance: >3 FB Neck ROM: Full    Dental   Pulmonary COPD, former smoker   breath sounds clear to auscultation       Cardiovascular hypertension, Pt. on medications + CAD and + Peripheral Vascular Disease   Rhythm:Regular Rate:Normal     Neuro/Psych  Neuromuscular disease    GI/Hepatic Neg liver ROS,GERD  ,,  Endo/Other  negative endocrine ROS    Renal/GU Renal disease     Musculoskeletal  (+) Arthritis ,    Abdominal   Peds  Hematology  (+) Blood dyscrasia, anemia   Anesthesia Other Findings   Reproductive/Obstetrics                              Anesthesia Physical Anesthesia Plan  ASA: 3 and emergent  Anesthesia Plan: General   Post-op Pain Management: Tylenol  PO (pre-op)*   Induction: Intravenous  PONV Risk Score and Plan: 2 and Dexamethasone , Ondansetron  and Treatment may vary due to age or medical condition  Airway Management Planned: Oral ETT  Additional Equipment: Arterial line  Intra-op Plan:   Post-operative Plan: Extubation in OR  Informed Consent: I have reviewed the patients History and Physical, chart, labs and discussed the procedure including the risks, benefits and alternatives for the proposed anesthesia with the patient or authorized representative who has indicated his/her understanding and acceptance.     Dental advisory given  Plan Discussed with: CRNA  Anesthesia Plan Comments:          Anesthesia Quick Evaluation

## 2024-09-25 DIAGNOSIS — I739 Peripheral vascular disease, unspecified: Secondary | ICD-10-CM

## 2024-09-25 LAB — CBC WITH DIFFERENTIAL/PLATELET
Abs Immature Granulocytes: 0.09 K/uL — ABNORMAL HIGH (ref 0.00–0.07)
Basophils Absolute: 0 K/uL (ref 0.0–0.1)
Basophils Relative: 0 %
Eosinophils Absolute: 0 K/uL (ref 0.0–0.5)
Eosinophils Relative: 0 %
HCT: 21.8 % — ABNORMAL LOW (ref 39.0–52.0)
Hemoglobin: 7.5 g/dL — ABNORMAL LOW (ref 13.0–17.0)
Immature Granulocytes: 1 %
Lymphocytes Relative: 7 %
Lymphs Abs: 0.7 K/uL (ref 0.7–4.0)
MCH: 33.6 pg (ref 26.0–34.0)
MCHC: 34.4 g/dL (ref 30.0–36.0)
MCV: 97.8 fL (ref 80.0–100.0)
Monocytes Absolute: 0.8 K/uL (ref 0.1–1.0)
Monocytes Relative: 7 %
Neutro Abs: 9.3 K/uL — ABNORMAL HIGH (ref 1.7–7.7)
Neutrophils Relative %: 85 %
Platelets: 165 K/uL (ref 150–400)
RBC: 2.23 MIL/uL — ABNORMAL LOW (ref 4.22–5.81)
RDW: 13.2 % (ref 11.5–15.5)
WBC: 11 K/uL — ABNORMAL HIGH (ref 4.0–10.5)
nRBC: 0 % (ref 0.0–0.2)

## 2024-09-25 LAB — POCT I-STAT 7, (LYTES, BLD GAS, ICA,H+H)
Acid-Base Excess: 0 mmol/L (ref 0.0–2.0)
Acid-Base Excess: 0 mmol/L (ref 0.0–2.0)
Bicarbonate: 26.7 mmol/L (ref 20.0–28.0)
Bicarbonate: 26 mmol/L (ref 20.0–28.0)
Calcium, Ion: 1.11 mmol/L — ABNORMAL LOW (ref 1.15–1.40)
Calcium, Ion: 1.09 mmol/L — ABNORMAL LOW (ref 1.15–1.40)
HCT: 26 % — ABNORMAL LOW (ref 39.0–52.0)
HCT: 31 % — ABNORMAL LOW (ref 39.0–52.0)
Hemoglobin: 8.8 g/dL — ABNORMAL LOW (ref 13.0–17.0)
Hemoglobin: 10.5 g/dL — ABNORMAL LOW (ref 13.0–17.0)
O2 Saturation: 100 %
O2 Saturation: 100 %
Patient temperature: 36.3
Patient temperature: 35.9
Potassium: 4.4 mmol/L (ref 3.5–5.1)
Potassium: 4.5 mmol/L (ref 3.5–5.1)
Sodium: 130 mmol/L — ABNORMAL LOW (ref 135–145)
Sodium: 130 mmol/L — ABNORMAL LOW (ref 135–145)
TCO2: 28 mmol/L (ref 22–32)
TCO2: 28 mmol/L (ref 22–32)
pCO2 arterial: 49 mmHg — ABNORMAL HIGH (ref 32–48)
pCO2 arterial: 47.6 mmHg (ref 32–48)
pH, Arterial: 7.341 — ABNORMAL LOW (ref 7.35–7.45)
pH, Arterial: 7.341 — ABNORMAL LOW (ref 7.35–7.45)
pO2, Arterial: 207 mmHg — ABNORMAL HIGH (ref 83–108)
pO2, Arterial: 205 mmHg — ABNORMAL HIGH (ref 83–108)

## 2024-09-25 LAB — COMPREHENSIVE METABOLIC PANEL WITH GFR
ALT: 42 U/L (ref 0–44)
AST: 35 U/L (ref 15–41)
Albumin: 2.4 g/dL — ABNORMAL LOW (ref 3.5–5.0)
Alkaline Phosphatase: 50 U/L (ref 38–126)
Anion gap: 6 (ref 5–15)
BUN: 10 mg/dL (ref 8–23)
CO2: 23 mmol/L (ref 22–32)
Calcium: 7.4 mg/dL — ABNORMAL LOW (ref 8.9–10.3)
Chloride: 102 mmol/L (ref 98–111)
Creatinine, Ser: 0.92 mg/dL (ref 0.61–1.24)
GFR, Estimated: >60 mL/min (ref >60)
Glucose, Bld: 111 mg/dL — ABNORMAL HIGH (ref 70–99)
Potassium: 4.2 mmol/L (ref 3.5–5.1)
Sodium: 131 mmol/L — ABNORMAL LOW (ref 135–145)
Total Bilirubin: 0.8 mg/dL (ref 0.0–1.2)
Total Protein: 4.9 g/dL — ABNORMAL LOW (ref 6.5–8.1)

## 2024-09-25 LAB — HEPARIN LEVEL (UNFRACTIONATED)
Heparin Unfractionated: <0.1 [IU]/mL — ABNORMAL LOW (ref 0.30–0.70)
Heparin Unfractionated: <0.1 [IU]/mL — ABNORMAL LOW (ref 0.30–0.70)

## 2024-09-25 LAB — MAGNESIUM: Magnesium: 2.2 mg/dL (ref 1.7–2.4)

## 2024-09-25 LAB — POCT ACTIVATED CLOTTING TIME
Activated Clotting Time: 256 s
Activated Clotting Time: 210 s
Activated Clotting Time: 227 s
Activated Clotting Time: 279 s

## 2024-09-25 LAB — PHOSPHORUS: Phosphorus: 3.7 mg/dL (ref 2.5–4.6)

## 2024-09-25 NOTE — Evaluation (Signed)
 Physical Therapy Evaluation Patient Details Name: David King MRN: [id number] DOB: January 02, 1956 Today's Date: 09/25/2024  History of Present Illness  68 y.o. 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 Pt went to OR x2 on 9/26 initially for R axial bypass, bilateral femoral thrombectomy and RLE fasciotomy and later for redo RLE thrombectomy. Pt recently underwent L4-5 decompression and fusion on 9/10 with anaphylactic reaction during surgery requiring ICU stay. PMHx: COPD, CAD, PAD, tobacco use, lumbar radiculopathy, detached retina L eye  Clinical Impression  Pt presents to PT with deficits in strength, power, gait, balance, endurance. Pt is limited by reports of significant pain at bilateral groin sites and RLE. Pt has good recall of back precautions, and initiates rolling well, but declines attempts at standing due to pain. Pt will benefit from frequent mobilization in an effort to reduce falls risk and to restore independence. Patient will benefit from intensive inpatient follow-up therapy, >3 hours/day.        If plan is discharge home, recommend the following: A lot of help with walking and/or transfers;A lot of help with bathing/dressing/bathroom;Assistance with cooking/housework;Assist for transportation;Help with stairs or ramp for entrance   Can travel by private vehicle        Equipment Recommendations Wheelchair (measurements PT);Wheelchair cushion (measurements PT);BSC/3in1  Recommendations for Other Services  Rehab consult    Functional Status Assessment Patient has had a recent decline in their functional status and demonstrates the ability to make significant improvements in function in a reasonable and predictable amount of time.     Precautions / Restrictions Precautions Precautions: Fall;Back Precaution Booklet Issued: Yes (comment) Recall of  Precautions/Restrictions: Intact Precaution/Restrictions Comments: watch BP (orthostatic) Spinal Brace:  (LSO present but not utilized as pt declines attempts at standing) Restrictions Weight Bearing Restrictions Per Provider Order: No      Mobility  Bed Mobility Overal bed mobility: Needs Assistance Bed Mobility: Rolling, Sidelying to Sit, Sit to Sidelying Rolling: Contact guard assist Sidelying to sit: Mod assist     Sit to sidelying: Mod assist      Transfers Overall transfer level:  (pt deferred transfer attempts due to pain)                      Ambulation/Gait                  Stairs            Wheelchair Mobility     Tilt Bed    Modified Rankin (Stroke Patients Only)       Balance Overall balance assessment: Needs assistance Sitting-balance support: Feet supported, Single extremity supported Sitting balance-Leahy Scale: Poor                                       Pertinent Vitals/Pain Pain Assessment Pain Assessment: Faces Faces Pain Scale: Hurts whole lot Pain Location: groin incision sites and RLE Pain Descriptors / Indicators: Sharp Pain Intervention(s): Monitored during session    Home Living Family/patient expects to be discharged to:: Private residence Living Arrangements: Spouse/significant other Available Help at Discharge: Family;Available 24 hours/day Type of Home: House Home Access: Stairs to enter Entrance Stairs-Rails: Can reach both Entrance Stairs-Number of Steps: 4 Alternate Level Stairs-Number of Steps: pt does not access second level of  home Home Layout: Two level Home Equipment: Agricultural consultant (2 wheels);Shower seat;Grab bars - tub/shower;Cane - single point;Adaptive equipment;Hand held shower head;Other (comment) Additional Comments: bench in shower HH showerhead, grab bars in shower    Prior Function Prior Level of Function : Independent/Modified Independent;Driving              Mobility Comments: mod I with hurry cane ADLs Comments: ind     Extremity/Trunk Assessment   Upper Extremity Assessment Upper Extremity Assessment: Generalized weakness    Lower Extremity Assessment Lower Extremity Assessment: RLE deficits/detail;LLE deficits/detail RLE Deficits / Details: formal ROM and strength assessment deferred due to pain. edema noted at R foot. flicker of ankle PF/DF noted RLE Sensation: decreased light touch LLE Deficits / Details: grossly 3/5 LLE Sensation: decreased light touch    Cervical / Trunk Assessment Cervical / Trunk Assessment: Back Surgery  Communication   Communication Communication: No apparent difficulties    Cognition Arousal: Alert Behavior During Therapy: WFL for tasks assessed/performed   PT - Cognitive impairments: No apparent impairments                         Following commands: Intact       Cueing Cueing Techniques: Verbal cues, Visual cues     General Comments General comments (skin integrity, edema, etc.): pt on 3L [place], VSS. RLE weeping with serous drainage noted on pillowcase    Exercises     Assessment/Plan    PT Assessment Patient needs continued PT services  PT Problem List Decreased strength;Decreased activity tolerance;Decreased balance;Decreased mobility;Decreased cognition;Decreased knowledge of use of DME;Decreased safety awareness;Pain       PT Treatment Interventions DME instruction;Gait training;Functional mobility training;Stair training;Therapeutic exercise;Therapeutic activities;Balance training;Neuromuscular re-education;Cognitive remediation;Patient/family education    PT Goals (Current goals can be found in the Care Plan section)  Acute Rehab PT Goals Patient Stated Goal: to return to independence and reduce pain PT Goal Formulation: With patient Time For Goal Achievement: 10/09/24 Potential to Achieve Goals: Fair    Frequency Min 3X/week     Co-evaluation                AM-PAC PT 6 Clicks Mobility  Outcome Measure Help needed turning from your back to your side while in a flat bed without using bedrails?: A Little Help needed moving from lying on your back to sitting on the side of a flat bed without using bedrails?: A Lot Help needed moving to and from a bed to a chair (including a wheelchair)?: Total Help needed standing up from a chair using your arms (e.g., wheelchair or bedside chair)?: Total Help needed to walk in hospital room?: Total Help needed climbing 3-5 steps with a railing? : Total 6 Click Score: 9    End of Session Equipment Utilized During Treatment: Oxygen  Activity Tolerance: Patient limited by pain Patient left: in bed;with call bell/phone within reach;with bed alarm set Nurse Communication: Mobility status;Need for lift equipment PT Visit Diagnosis: Other abnormalities of gait and mobility (R26.89);Muscle weakness (generalized) (M62.81);Other symptoms and signs involving the nervous system (R29.898)    Time: 8374-8347 PT Time Calculation (min) (ACUTE ONLY): 27 min   Charges:   PT Evaluation $PT Eval Moderate Complexity: 1 Mod   PT General Charges $$ ACUTE PT VISIT: 1 Visit         Bernardino JINNY Ruth, PT, DPT Acute Rehabilitation Office [phone]   Bernardino JINNY Ruth 09/25/2024, 5:20 PM

## 2024-09-25 NOTE — Evaluation (Signed)
 Clinical/Bedside Swallow Evaluation Patient Details  Name: David King MRN: [id number] Date of Birth: 05-15-1956  Today's Date: 09/25/2024 Time: SLP Start Time (ACUTE ONLY): 1155 SLP Stop Time (ACUTE ONLY): 1215 SLP Time Calculation (min) (ACUTE ONLY): 20 min  Past Medical History:  Past Medical History:  Diagnosis Date   Anxiety    new dx   Arthritis    lumbar   Atherosclerotic vascular disease    Cataract    COPD (chronic obstructive pulmonary disease) (HCC)    Diverticulosis    Elevated PSA    Headache(784.0)    MIGRAINES   Hypertension 2021   Iritis    CHRONIC IN LEFT EYE - SOME VISIAL IMPAIRMENT IN LEFT EYE   Neuromuscular disorder (HCC)    left leg/foot,pinched siactic nerve   Pain    PAIN LEFT HIP AND DOWN LT LEG WITH NUMBNESS IN LEFT LEG--PT STATES SCIATIC NERVE IMPINGEMENT - PT PLANS BACK IN THE NEAR SURGERY.   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   Renal cysts, acquired, bilateral 03/19/2013   several simple , CT   Urinary frequency    AND NOCTURIA   Past Surgical History:  Past Surgical History:  Procedure Laterality Date   ABDOMINAL AORTOGRAM W/LOWER EXTREMITY N/A 09/21/2024   Procedure: ABDOMINAL AORTOGRAM W/LOWER EXTREMITY;  Surgeon: Serene Gaile ORN, MD;  Location: [facility];  Service: Cardiovascular;  Laterality: N/A;   BACK SURGERY     CARDIAC CATHETERIZATION  03/10/2018   CATARACT EXTRACTION W/ INTRAOCULAR LENS IMPLANT Left    EYE SURGERY     RETINAL SURGERY LEFT EYE   HYDROCELE EXCISION Left 03/05/2013   Procedure: HYDROCELECTOMY ADULT;  Surgeon: Donnice Gwenyth Brooks, MD;  Location: [facility];  Service: Urology;  Laterality: Left;   INCISIONAL HERNIA REPAIR N/A 06/18/2022   Procedure: REPAIR OF INTERNAL HERNIA;  Surgeon: Rubin Calamity, MD;  Location: [facility] OR;  Service: General;  Laterality: N/A;   INGUINAL HERNIA REPAIR Bilateral AGE 68   INSERTION OF MESH N/A 06/11/2022    Procedure: INSERTION OF MESH;  Surgeon: Rubin Calamity, MD;  Location: [facility] OR;  Service: General;  Laterality: N/A;   INSERTION OF MESH N/A 06/18/2022   Procedure: INSERTION OF MESH;  Surgeon: Rubin Calamity, MD;  Location: [facility] OR;  Service: General;  Laterality: N/A;   KYPHOPLASTY N/A 05/08/2023   Procedure: THORACIC SEVEN KYPHOPLASTY;  Surgeon: Beuford Anes, MD;  Location: [facility] OR;  Service: Orthopedics;  Laterality: N/A;   LAPAROSCOPY N/A 06/18/2022   Procedure: LAPAROSCOPY DIAGNOSTIC;  Surgeon: Rubin Calamity, MD;  Location: [facility] OR;  Service: General;  Laterality: N/A;   LYMPHADENECTOMY Bilateral 05/06/2013   Procedure: REDGIE;  Surgeon: Noretta Ferrara, MD;  Location: [facility];  Service: Urology;  Laterality: Bilateral;   PROSTATE BIOPSY N/A 03/05/2013   Procedure: PROSTATE BIOPSY and ultrasound;  Surgeon: Donnice Gwenyth Brooks, MD;  Location: [facility];  Service: Urology;  Laterality: N/A;   REMOVAL BURSA SAC, LEFT ELBOW  1996   ROBOT ASSISTED LAPAROSCOPIC RADICAL PROSTATECTOMY N/A 05/06/2013   Procedure: ROBOTIC ASSISTED LAPAROSCOPIC RADICAL PROSTATECTOMY LEVEL 3;  Surgeon: Noretta Ferrara, MD;  Location: [facility];  Service: Urology;  Laterality: N/A;   TONSILLECTOMY     as achild   TRANSFORAMINAL LUMBAR INTERBODY FUSION (TLIF) WITH PEDICLE SCREW FIXATION 1 LEVEL Left 09/08/2024   Procedure: LEFT-SIDED LUMBAR 4- LUMBAR 5 TRANSFORAMINAL LUMBAR INTERBODY FUSION AND DECOMPRESSION WITH INSTRUMENTATION AND ALLOGRAFT;  Surgeon: Beuford Anes,  MD;  Location: [facility] OR;  Service: Orthopedics;  Laterality: Left;  LEFT-SIDED LUMBAR 4- LUMBAR 5 TRANSFORAMINAL LUMBAR INTERBODY FUSION AND DECOMPRESSION WITH INSTRUMENTATION AND ALLOGRAFT   XI ROBOTIC ASSISTED VENTRAL HERNIA N/A 06/11/2022   Procedure: ROBOTIC INCISIONAL HERNIA REPAIR WITH MESH;  Surgeon: Rubin Calamity, MD;  Location: [facility] OR;  Service: General;  Laterality: N/A;   HPI:  David King  is a 68 y.o. male, with history of  Lumbar radiculopathy, chronic pain,  Anaphylactic reaction during lumbar fusion with hypotension dyslipidemia, COPD, prostate cancer, detached retina of the left eye, PAD with stenting of bilateral common iliac arteries who was initially admitted to the hospital 2 weeks ago for L4-L5 disc disease with left lower extremity pain and weakness, he underwent L4-L5 t decompression and fusion on 09/08/2024 by Dr. Beuford.  He required short-term intubation and transferred to ICU, post recovery he was noted to have some improvement in his left leg pain, postop site looked okay but he developed some right lower extremity weakness.  Discharged to inpatient rehab where he was seen by neurology underwent MRI brain which was unremarkable, EMG showed some nonspecific changes.  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 Taken back to the OR on 09/24/2024 for right axillary femoral bypass graft, bilateral common femoral, superficial femoral and profundofemoral thrombectomy, right leg anterior compartment fasciotomy due to compartment syndrome, right femoral endarterectomy.    Assessment / Plan / Recommendation  Clinical Impression  Patient presents with a functional oropharyngeal swallow. He reports a long h/o occassional esophageal spasms at the onset of meals. Reports that he does not see an MD for this currently. Baseline congested cough noted which continued but did not appear to significantly increase during po intake. Overall, swallowing functiona appearing normal with appropriate oral containment and control of bolus, full oral clearance and swift appearing swallow initiation and strength based on palpation. No further SLP needs indicated at this time. Educated patient and spouse regarding general safe  swallowing/reflux precautions. SLP Visit Diagnosis: Dysphagia, unspecified (R13.10)    Aspiration Risk       Diet Recommendation Regular;Thin liquid    Liquid Administration via: Cup;Straw Medication Administration: Whole meds with liquid Supervision: Patient able to self feed Compensations: Slow rate;Small sips/bites Postural Changes: Seated upright at 90 degrees;Remain upright for at least 30 minutes after po intake    Other  Recommendations Oral Care Recommendations: Oral care BID     Assistance Recommended at Discharge    Functional Status Assessment Patient has not had a recent decline in their functional status  Frequency and Duration            Prognosis        Swallow Study   General HPI: David King  is a 68 y.o. male, with history of Lumbar radiculopathy, chronic pain,  Anaphylactic reaction during lumbar fusion with hypotension dyslipidemia, COPD, prostate cancer, detached retina of the left eye, PAD with stenting of bilateral common iliac arteries who was initially admitted to the hospital 2 weeks ago for L4-L5 disc disease with left lower extremity pain and weakness, he underwent L4-L5 t decompression and fusion on 09/08/2024 by Dr. Beuford.  He required short-term intubation and transferred to ICU, post recovery he was noted to have some improvement in his left leg pain, postop site looked okay but he developed  some right lower extremity weakness.  Discharged to inpatient rehab where he was seen by neurology underwent MRI brain which was unremarkable, EMG showed some nonspecific changes.  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 Taken back to the OR on 09/24/2024 for right axillary femoral bypass graft, bilateral common femoral, superficial femoral and  profundofemoral thrombectomy, right leg anterior compartment fasciotomy due to compartment syndrome, right femoral endarterectomy. Type of Study: Bedside Swallow Evaluation Previous Swallow Assessment: none in chart Diet Prior to this Study: Regular;Thin liquids (Level 0) Temperature Spikes Noted: No Respiratory Status: Room air History of Recent Intubation: Yes (surgery only) Behavior/Cognition: Alert;Cooperative;Pleasant mood Oral Cavity Assessment: Within Functional Limits Oral Care Completed by SLP: No Oral Cavity - Dentition: Adequate natural dentition Vision: Functional for self-feeding Self-Feeding Abilities: Able to feed self Patient Positioning: Upright in bed Baseline Vocal Quality: Normal Volitional Cough: Congested Volitional Swallow: Able to elicit    Oral/Motor/Sensory Function Overall Oral Motor/Sensory Function: Within functional limits (except for left eye which is baseline due to detached retina)   Ice Chips Ice chips: Not tested   Thin Liquid Thin Liquid: Within functional limits Presentation: Self Fed;Straw    Nectar Thick Nectar Thick Liquid: Not tested   Honey Thick Honey Thick Liquid: Not tested   Puree Puree: Within functional limits Presentation: Self Fed;Spoon   Solid     Solid: Within functional limits Presentation: Self Fed     [employer] MA, CCC-SLP  [place] Meryl 09/25/2024,12:20 PM

## 2024-09-25 NOTE — Progress Notes (Signed)
 PROGRESS NOTE  David King  DOB: 1956/10/11  PCP: [name], MD FMW:[id number]  DOA: 09/24/2024  LOS: 1 day  Hospital Day: 2  Brief narrative: David King is a 68 y.o. male with PMH significant for HTN, HLD, PAD s/p b/l common Ilac artery stenting, prostate cancer, COPD, diverticulosis, chronic back pain, lumbar radiculopathy, left eye with times detachment. Because of progressive radiculopathy pain, patient was following up with orthopedics as an outpatient.  He had previous L4-L5 disc decompression.  Recent MRI showed severe stenosis of the left L4-L5 due to large disc herniation.  09/08/2024, patient underwent revision L4-L5 decompression, lumbar body fusion by Dr. Beuford.  During the procedure, patient had an anaphylactic reaction and developed hives with some hypotension. He required short-term intubation and transferred to ICU, post recovery he was noted to have some improvement in his left leg pain, postop site looked okay but he developed some right lower extremity weakness.  Neurology was consulted.  MRI brain was unremarkable.  EMG showed nonspecific changes.  9/17, discharged to CIR. 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 9/26, patient was readmitted to acute care with a plan of revascularization.  Patient underwent right axillary femoral bypass graft, bilateral common femoral, superficial femoral and profundofemoral thrombectomy, right leg anterior compartment fasciotomy due to compartment syndrome, right femoral endarterectomy. Soon after coming out of the OR, he was found to have decrease in his right lower extremity pulses again. Seen by vascular surgery, taken to the OR again.  Underwent right leg angiogram, found to have thrombus in the popliteal artery,  underwent successful thrombectomy. Postop, admitted to [facility]  Subjective: Patient was seen and examined this morning. Pleasant elderly Caucasian male.  Propped up in bed.  Not in any distress.  Right lower extremity on Ace wrap.  Says pain is improving.  Feels tingly. Chart reviewed. 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 9/26, patient was readmitted to acute care with a plan of revascularization.  Patient underwent right axillary femoral bypass graft, bilateral common femoral, superficial femoral and profundofemoral thrombectomy, right leg anterior compartment fasciotomy due to compartment syndrome, right femoral endarterectomy. Soon after coming out of the OR, he was found to have decrease in his right lower extremity pulses again. Seen by vascular surgery, taken to the OR again.  Underwent right leg angiogram, found to have thrombus in the popliteal artery, underwent successful thrombectomy. Currently on heparin  drip, aspirin  81 mg daily, statin Vascular surgery following.  Per surgery note from this morning, right DP/PT Doppler signals are good.  Lumbar radiculopathy S/p recent L4-L5 decompression/fusion -9/10 Dr Beuford PT/OT eval Pain regimen --- Scheduled: OxyContin  10 mg twice daily, Lyrica  150 mg 3 times daily --- PRN: Morphine  2 mg every 4 hours, oxycodone  10 mg every 4 hours  Leukocytosis Mild.  Likely reactive.  Received perioperative antibiotics.  Currently not on scheduled antibiotic Recent Labs  Lab 09/18/24 1603 09/19/24 1312 09/23/24 0507  09/24/24 0318 09/25/24 0500  WBC 11.7* 11.1* 9.1 10.2 11.0*   Acute blood loss anemia It seems, hemoglobin has been mildly running low since surgery on 9/10.  Acute drop noted this morning to 7.5 after 2 surgeries yesterday. Continue to monitor.  May need transfusion. Obtain type and screen tomorrow Recent Labs    09/23/24 0507 09/24/24 0318 09/24/24 1311 09/24/24 1415 09/25/24 0500  HGB 10.8* 10.4* 8.8* 10.5* 7.5*  MCV 98.2 98.1  --   --  97.8    Hyponatremia Mild.  Continue to monitor Recent Labs  Lab 09/19/24 1312 09/22/24 0551 09/24/24 1311 09/24/24 1415 09/25/24 0500  NA 130* 131* 130* 130* 131*    Hyperlipidemia Crestor  20 mg daily   H/o prostate cancer  Continue Flomax    COPD Bronchodilators as needed  Diverticulosis Encourage regular bowel regimen Currently on Colace 100 mg twice daily.  Anxiety/depression Celexa  20 mg daily, Cymbalta  nightly, Ambien  nightly as needed   Mobility:  PT Orders: Active   PT Follow up Rec:    Goals of care   Code Status: Full Code     DVT prophylaxis: Heparin  drip    Antimicrobials: None Fluid: Currently on LR at 75 Consultants: Vascular surgery Family Communication: None at bedside  Status: Inpatient Level of care:  Progressive   Patient is from: CIR Needs to continue in-hospital care: POD 1 Anticipated d/c to: Pending clinical course, PT eval      Diet:  Diet Order             Diet regular Fluid consistency: Thin  Diet effective now                   Scheduled Meds:  aspirin  EC  81 mg Oral Daily   citalopram   20 mg Oral Daily   docusate sodium   100 mg Oral BID   DULoxetine   30 mg Oral QHS   oxyCODONE   10 mg Oral Q12H   pregabalin   150 mg Oral TID   rosuvastatin   20 mg Oral Daily   tamsulosin   0.4 mg Oral QPC supper    PRN meds: acetaminophen , albuterol , morphine  injection, naLOXone  (NARCAN )  injection, ondansetron  **OR** ondansetron  (ZOFRAN ) IV, oxyCODONE , potassium chloride ,  senna-docusate, zolpidem    Infusions:   heparin  500 Units/hr (09/24/24 2105)   lactated ringers  75 mL/hr at 09/25/24 1119    Antimicrobials: Anti-infectives (From admission, onward)    Start     Dose/Rate Route Frequency Ordered Stop   09/24/24 2115  vancomycin  (VANCOCIN ) IVPB 1000 mg/200 mL premix        1,000 mg 200 mL/hr over 60 Minutes Intravenous Every 12 hours 09/24/24 2027 09/25/24 1030       Objective: Vitals:   09/25/24 1212 09/25/24 1330  BP:    Pulse:    Resp: 12 14  Temp:    SpO2: 98% 90%    Intake/Output Summary (Last 24 hours) at 09/25/2024 1338 Last data filed at 09/25/2024 0630 Gross per 24 hour  Intake 2650 ml  Output 2315 ml  Net 335 ml   There were no vitals filed for this visit. Weight change:  There is no height or weight on file to calculate BMI.   Physical Exam: General exam: Pleasant, elderly Caucasian male Skin: No rashes, lesions or ulcers. HEENT: Atraumatic, normocephalic, no obvious bleeding Lungs: Clear to auscultation bilaterally,  CVS: S1, S2, no murmur,   GI/Abd: Soft, nontender, nondistended, bowel sound present,   CNS: Alert, awake, oriented x 3 Psychiatry: Sad affect Extremities: Right lower  extremity on Ace wrap   Data Review: I have personally reviewed the laboratory data and studies available.  F/u labs ordered Unresulted Labs (From admission, onward)     Start     Ordered   09/26/24 0500  Heparin  level (unfractionated)  Daily,   R      09/25/24 0741   09/26/24 0500  Type and screen  Once,   R        09/25/24 0832   09/25/24 1530  Heparin  level (unfractionated)  Once-Timed,   TIMED        09/25/24 0741   09/25/24 0500  CBC with Differential/Platelet  Daily,   R     Question:  Specimen collection method  Answer:  IV Team=IV Team collect   09/24/24 1556   09/25/24 0500  Comprehensive metabolic panel with GFR  Daily,   R     Question:  Specimen collection method  Answer:  IV Team=IV Team collect   09/24/24 1556    09/25/24 0500  Magnesium   Daily,   R     Question:  Specimen collection method  Answer:  IV Team=IV Team collect   09/24/24 1556   09/25/24 0500  Phosphorus  Daily,   R     Question:  Specimen collection method  Answer:  IV Team=IV Team collect   09/24/24 1556            Signed, Chapman Rota, MD [facility] 09/25/2024

## 2024-09-25 NOTE — Progress Notes (Addendum)
 Progress Note    09/25/2024 9:03 AM 1 Day Post-Op  Subjective: Says he feels okay, having pain at his incision sites.  He says his right leg feels tingly, pain is improved.   Vitals:   09/25/24 0500 09/25/24 0821  BP: 115/65 (!) 135/113  Pulse: 60 85  Resp: 16 12  Temp:  (!) 97.5 F (36.4 C)  SpO2: 97% 100%    Physical Exam: General: Laying comfortably in bed, alert and oriented x 3 Cardiac: Regular Lungs: Nonlabored Incisions: Right axillary and bilateral groin incisions soft, intact, and dry.  No hematoma present.  Right lower extremity incisions bandaged and dry Extremities: Intact right radial and palmar arch Doppler signals.  Intact motor and sensation of the right hand.  Intact motor and sensation of the left foot with brisk DP/PT Doppler signals.  Brisk right PT Doppler signal.  Right foot is warm to touch.  Unchanged lack of sensation and motor in the right foot  CBC    Component Value Date/Time   WBC 11.0 (H) 09/25/2024 0500   RBC 2.23 (L) 09/25/2024 0500   HGB 7.5 (L) 09/25/2024 0500   HCT 21.8 (L) 09/25/2024 0500   PLT 165 09/25/2024 0500   MCV 97.8 09/25/2024 0500   MCH 33.6 09/25/2024 0500   MCHC 34.4 09/25/2024 0500   RDW 13.2 09/25/2024 0500   LYMPHSABS 0.7 09/25/2024 0500   MONOABS 0.8 09/25/2024 0500   EOSABS 0.0 09/25/2024 0500   BASOSABS 0.0 09/25/2024 0500    BMET    Component Value Date/Time   NA 131 (L) 09/25/2024 0500   K 4.2 09/25/2024 0500   CL 102 09/25/2024 0500   CO2 23 09/25/2024 0500   GLUCOSE 111 (H) 09/25/2024 0500   BUN 10 09/25/2024 0500   CREATININE 0.92 09/25/2024 0500   CREATININE 1.06 10/10/2020 0915   CALCIUM  7.4 (L) 09/25/2024 0500   GFRNONAA >60 09/25/2024 0500   GFRNONAA 73 10/10/2020 0915   GFRAA 84 10/10/2020 0915    INR    Component Value Date/Time   INR 1.2 08/22/2022 0833     Intake/Output Summary (Last 24 hours) at 09/25/2024 0903 Last data filed at 09/25/2024 0630 Gross per 24 hour  Intake 3150 ml   Output 3265 ml  Net -115 ml      Assessment/Plan:  68 y.o. male is 1 day postop, s/p: #1: Right axillary bifemoral bypass graft                       #2: Bilateral common femoral, superficial femoral, and profundofemoral thrombectomy                       #3: Redo left common femoral artery exposure                       #4: 4 compartment right leg fasciotomy                       #5: Resection of necrotic muscle in the right anterior compartment                       #6: Bilateral femoral endarterectomy  With same-day takeback for right popliteal artery exposure, right popliteal thrombectomy, right tibial thrombectomy     -He is doing okay this morning.  He is having a lot of pain at his incision sites.  He says his  right leg feels a little bit better and is tingly -Right axillary and bilateral groin incisions are soft, intact, and dry.  No evidence of hematoma.  Mild bruising in the right groin. -Bilateral lower extremities warm and well-perfused.  Brisk right PT Doppler signal.  Brisk left DP/PT Doppler signals. -Right arm is warm and well-perfused with radial and palmar arch Doppler signals.  He has intact motor and sensation of the right hand without pain. -Right lower extremity fasciotomy sites are bandaged and dry.  Will plan to change this dressing at the bedside tomorrow -He continues to have lack of sensation and motor in the right foot, likely due to extended period of time with poor blood flow to the foot -Hemoglobin at 7.5 this morning without further signs of blood loss.  Acute blood loss anemia due to surgery yesterday.  Will continue to monitor, transfuse as needed -Continue aspirin  and heparin  drip at 500 units/h -Okay to mobilize as tolerated with PT/OT   Ahmed Holster, PA-C [facility] [phone] 09/25/2024 9:03 AM  I have seen and evaluated the patient. I agree with the PA note as documented above.  Status post ax bifemoral bypass yesterday  with right lower extremity thrombectomy.  This also required 4 compartment fasciotomies in the right leg with resection of necrotic muscle.  Went back to the OR after lost signals in the right foot for additional popliteal thrombectomy.  Today he has brisk PT signals bilaterally.  Incisions all look good.  No significant motor function of the right leg with foot drop and consistent with his preop exam.  Continue heparin  at 500 units an hour.  Hemoglobin today 7.5.  Lonni DOROTHA Gaskins, MD [facility] [place] Office: [phone]

## 2024-09-25 NOTE — Progress Notes (Signed)
 PHARMACY - ANTICOAGULATION CONSULT NOTE  Pharmacy Consult for Heparin  Indication:  arterial insufficiency / occlusion  Allergies  Allergen Reactions   Ancef  [Cefazolin ] Anaphylaxis    Patient Measurements:    Vital Signs: Temp: 97.6 F (36.4 C) (09/26 2300) Temp Source: Oral (09/26 2300) BP: 115/65 (09/27 0500) Pulse Rate: 60 (09/27 0500)  Labs: Recent Labs    09/23/24 0507 09/23/24 2045 09/24/24 0318 09/24/24 1311 09/24/24 1415 09/25/24 0500  HGB 10.8*  --  10.4* 8.8* 10.5* 7.5*  HCT 32.4*  --  31.0* 26.0* 31.0* 21.8*  PLT 164  --  172  --   --  165  HEPARINUNFRC 0.28* 0.41 0.24*  --   --  <0.10*  CREATININE  --   --   --   --   --  0.92  CKTOTAL  --   --  1,434*  --   --   --     Estimated Creatinine Clearance: 74.7 mL/min (by C-G formula based on SCr of 0.92 mg/dL).  Assessment: 68 YOM admitted w/ severe vascular insufficiency who is tentatively scheduled for right axillary bifemoral bypass w/ possible right common femoral to popliteal bypass. No PTA AC noted through a search of his EMR. Of note, he had recent lumbar fusion on 09/08/2024. Consult for heparin  therapy received 09/22/24.   9/24 RPh visited patient at bedside. He has a bruise that encompasses the entire backside of his left hand from a blood draw this morning. Patient stated the bruise developed to that size within 20 minutes of draw. Target a lower goal for heparin  and omit the load dose given recent surgery and the above observation.   S/p R popliteal and tibial thrombectomy on 9/26.  Subtherapeutic heparin  level of < 0.1 on 500 units/hr. Given pt is on a low fixed rate of 500 units/hr per MD will not change the rate.  Hgb 7.5 and PLT 165. Per RN, no signs of bleeding and no issues with infusion.  Goal of Therapy:  Heparin  level 0.3-0.5 units/ml Monitor platelets by anticoagulation protocol: Yes   Plan:  Continue heparin  infusion at 500 units/hr per consult request Check heparin  level in 8 hours  with AM labs and daily while on heparin  Continue to monitor H&H and platelets  Thank you for allowing pharmacy to be a part of this patient's care.  Prentice DOROTHA Favors, PharmD PGY1 Health-System Pharmacy Administration and Leadership Resident [facility]  09/25/2024 7:17 AM

## 2024-09-26 DIAGNOSIS — I739 Peripheral vascular disease, unspecified: Secondary | ICD-10-CM | POA: Diagnosis not present

## 2024-09-26 LAB — CBC WITH DIFFERENTIAL/PLATELET
Abs Immature Granulocytes: 0.07 K/uL (ref 0.00–0.07)
Basophils Absolute: 0 K/uL (ref 0.0–0.1)
Basophils Relative: 0 %
Eosinophils Absolute: 0.2 K/uL (ref 0.0–0.5)
Eosinophils Relative: 2 %
HCT: 20.5 % — ABNORMAL LOW (ref 39.0–52.0)
Hemoglobin: 6.8 g/dL — CL (ref 13.0–17.0)
Immature Granulocytes: 1 %
Lymphocytes Relative: 8 %
Lymphs Abs: 0.8 K/uL (ref 0.7–4.0)
MCH: 33.2 pg (ref 26.0–34.0)
MCHC: 33.2 g/dL (ref 30.0–36.0)
MCV: 100 fL (ref 80.0–100.0)
Monocytes Absolute: 0.8 K/uL (ref 0.1–1.0)
Monocytes Relative: 9 %
Neutro Abs: 7.6 K/uL (ref 1.7–7.7)
Neutrophils Relative %: 80 %
Platelets: 137 K/uL — ABNORMAL LOW (ref 150–400)
RBC: 2.05 MIL/uL — ABNORMAL LOW (ref 4.22–5.81)
RDW: 13.3 % (ref 11.5–15.5)
WBC: 9.5 K/uL (ref 4.0–10.5)
nRBC: 0 % (ref 0.0–0.2)

## 2024-09-26 LAB — PREPARE RBC (CROSSMATCH)

## 2024-09-26 LAB — COMPREHENSIVE METABOLIC PANEL WITH GFR
ALT: 39 U/L (ref 0–44)
AST: 31 U/L (ref 15–41)
Albumin: 2.4 g/dL — ABNORMAL LOW (ref 3.5–5.0)
Alkaline Phosphatase: 50 U/L (ref 38–126)
Anion gap: 9 (ref 5–15)
BUN: 11 mg/dL (ref 8–23)
CO2: 25 mmol/L (ref 22–32)
Calcium: 7.6 mg/dL — ABNORMAL LOW (ref 8.9–10.3)
Chloride: 99 mmol/L (ref 98–111)
Creatinine, Ser: 1.28 mg/dL — ABNORMAL HIGH (ref 0.61–1.24)
GFR, Estimated: >60 mL/min (ref >60)
Glucose, Bld: 109 mg/dL — ABNORMAL HIGH (ref 70–99)
Potassium: 4.1 mmol/L (ref 3.5–5.1)
Sodium: 133 mmol/L — ABNORMAL LOW (ref 135–145)
Total Bilirubin: 0.9 mg/dL (ref 0.0–1.2)
Total Protein: 5.1 g/dL — ABNORMAL LOW (ref 6.5–8.1)

## 2024-09-26 LAB — TYPE AND SCREEN
ABO/RH(D): O POS
Antibody Screen: NEGATIVE

## 2024-09-26 LAB — HEMOGLOBIN AND HEMATOCRIT, BLOOD
HCT: 23.7 % — ABNORMAL LOW (ref 39.0–52.0)
Hemoglobin: 7.8 g/dL — ABNORMAL LOW (ref 13.0–17.0)

## 2024-09-26 LAB — PHOSPHORUS: Phosphorus: 2.9 mg/dL (ref 2.5–4.6)

## 2024-09-26 LAB — MAGNESIUM: Magnesium: 2.4 mg/dL (ref 1.7–2.4)

## 2024-09-26 LAB — HEPARIN LEVEL (UNFRACTIONATED): Heparin Unfractionated: <0.1 [IU]/mL — ABNORMAL LOW (ref 0.30–0.70)

## 2024-09-26 MED ORDER — SODIUM CHLORIDE 0.9% IV SOLUTION: Freq: Once | INTRAVENOUS | Status: AC

## 2024-09-26 MED ORDER — SODIUM CHLORIDE 0.9 % IV SOLN: INTRAVENOUS | Status: AC

## 2024-09-26 NOTE — Progress Notes (Addendum)
 Progress Note    09/26/2024 9:58 AM 2 Days Post-Op  Subjective: Says his pain in the right leg is little bit better    Vitals:   09/26/24 0700 09/26/24 0816  BP: 114/86 (!) 146/57  Pulse:  91  Resp: 16 (!) 25  Temp:  97.8 F (36.6 C)  SpO2: 94% 98%    Physical Exam: General: Laying in bed, alert and oriented x 3 Cardiac: Regular Lungs: Nonlabored Incisions: Right axillary and bilateral groin incisions intact and soft without hematoma.  Right medial calf fasciotomy site intact with staples.  Open right lateral leg fasciotomies with healthy muscle Extremities: Intact motor and sensation of right hand.  Brisk PT Doppler signals bilaterally.  Right foot is warm to touch.  Unchanged lack of sensation and motor in the right foot.   CBC    Component Value Date/Time   WBC 9.5 09/26/2024 0257   RBC 2.05 (L) 09/26/2024 0257   HGB 7.8 (L) 09/26/2024 0852   HCT 23.7 (L) 09/26/2024 0852   PLT 137 (L) 09/26/2024 0257   MCV 100.0 09/26/2024 0257   MCH 33.2 09/26/2024 0257   MCHC 33.2 09/26/2024 0257   RDW 13.3 09/26/2024 0257   LYMPHSABS 0.8 09/26/2024 0257   MONOABS 0.8 09/26/2024 0257   EOSABS 0.2 09/26/2024 0257   BASOSABS 0.0 09/26/2024 0257    BMET    Component Value Date/Time   NA 133 (L) 09/26/2024 0257   K 4.1 09/26/2024 0257   CL 99 09/26/2024 0257   CO2 25 09/26/2024 0257   GLUCOSE 109 (H) 09/26/2024 0257   BUN 11 09/26/2024 0257   CREATININE 1.28 (H) 09/26/2024 0257   CREATININE 1.06 10/10/2020 0915   CALCIUM  7.6 (L) 09/26/2024 0257   GFRNONAA >60 09/26/2024 0257   GFRNONAA 73 10/10/2020 0915   GFRAA 84 10/10/2020 0915    INR    Component Value Date/Time   INR 1.2 08/22/2022 0833     Intake/Output Summary (Last 24 hours) at 09/26/2024 0958 Last data filed at 09/26/2024 0626 Gross per 24 hour  Intake 1743.92 ml  Output 2525 ml  Net -781.08 ml      Assessment/Plan:  68 y.o. male is 2 days postop, s/p: #1: Right axillary bifemoral bypass  graft                       #2: Bilateral common femoral, superficial femoral, and profundofemoral thrombectomy                       #3: Redo left common femoral artery exposure                       #4: 4 compartment right leg fasciotomy                       #5: Resection of necrotic muscle in the right anterior compartment                       #6: Bilateral femoral endarterectomy   With same-day takeback for right popliteal artery exposure, right popliteal thrombectomy, right tibial thrombectomy   - He says he feels about the same this morning.  He does have a lot of pain at his incision sites.  His pain in the right leg is little bit better -Right axillary and bilateral groin incisions soft without hematoma -Bilateral lower extremities well-perfused with brisk  PT Doppler signals -We have changed the dressings on his right lower leg.  His right medial calf fasciotomy site is intact with staples.  His lateral fasciotomy site is open with healthy muscle.  This was redressed with a wet-to-dry dressing -Unfortunately he still has no return of sensation or motor in the right foot.  He does have sensation around the ankle and lower leg -Hemoglobin with slight drop to 6.6 this morning, likely due to surgical blood loss.  He is currently receiving 1 unit PRBCs.  Will continue to monitor -Continue aspirin  and heparin  drip at 500 units/h -Will plan for return to the OR tomorrow for right lower extremity fasciotomy washout and closure.  The patient is agreeable.  Will make n.p.o. at midnight.    Ahmed Holster, PA-C [facility] [phone] 09/26/2024 9:58 AM  I have seen and evaluated the patient. I agree with the PA note as documented above.  Right axillary incision and both groins look good after ax bifemoral bypass.  He has brisk Doppler PT signals in both feet.  Right foot continues to have no motor or sensation.  He has no flexion at the ankle with foot drop.  Consistent  with his preop exam.  Right leg fasciotomy dressing changed at bedside and all the muscle appears viable.  I will tentatively post for fasciotomy washout and closure tomorrow in the OR.  Continue heparin  at 500 units an hour.  Getting 1 unit PRBCs for hemoglobin 6.8.  Lonni DOROTHA Gaskins, MD [facility] [place] Office: [phone]

## 2024-09-26 NOTE — PMR Pre-admission (Signed)
 PMR Admission Coordinator Pre-Admission Assessment  Patient: David King is an 68 y.o., male MRN: [id number] DOB: September 18, 1956 Height: 5' 9 (175.3 cm) Weight: 86.2 kg  Insurance Information HMO:  yes   PPO:      PCP:      IPA:      80/20:      OTHER:  PRIMARY: UHC Medicare      Policy#: [id number], Medicare: [id number]     Subscriber: pt CM Name: UM dept      Phone#: [phone], option 8     Fax#:  [phone]  Pre-Cert#: [id number]  Employer:  Benefits:  Phone #:      Name:  Eustacio Date: 12/31/2023 - still active Deductible: $0 (does not have deductible) OOP Max: $3,800 ($788.11 met)  CIR: $355/day co-pay for days 1-5, $0/day co-pay for days 6+  SNF: $0.00 Copayment per day for days 1-20; $203 Copayment per day for days 21-100 for Medicare-covered care/maximum 100 days/benefit period  Outpatient: $20 copay/visit Home Health:  100% coverage, 0% co-insurance; limited by medical necessity  DME: 80% coverage; 20% co-insurance Providers: in network   SECONDARY:       Policy#:      Phone#:   Artist:       Phone#:   The Data processing manager" for patients in Inpatient Rehabilitation Facilities with attached "Privacy Act Statement-Health Care Records" was provided and verbally reviewed with: Patient  Emergency Contact Information Contact Information     Name Relation Home Work Mobile   [place]  [name] Spouse [phone]  [phone]      Other Contacts   None on File     Current Medical History  Patient Admitting Diagnosis: Lumbar myelopathy History of Present Illness:  David King is a 68 year old right handed male with history significant for anxiety, migraine headaches, detached retina left eye, hypertension, prostate cancer status post radical prostatectomy 2014, COPD/quit smoking 7 years ago as well as history of left common femoral enterectomy with bilateral common iliac artery stenting and left external iliac artery stenting  November 2023 at [facility].  Per chart review patient lives with spouse.  1 level home 4 steps to entry.  Modified independent with a cane prior to admission.  Presented 09/08/2024 with low back pain radiating to the left lower extremity.  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  Underwent left-sided L4-5 transforaminal lumbar interbody fusion/posterior lateral fusion insertion of interbody device revision L4-5 decompression 09/08/2024 per Dr. Beuford.  Noted intraoperatively patient had developed hives swelling of tongue and lips along with hypotension with critical care medicine consulted possible anaphylactic shock and was started on epi drip and given Decadron .  He did require short-term intubation was transferred to the ICU and extubated 9/11.  MRI T-spine with no acute findings.  MRI lumbar spine showed L4-5 PLIF moderate residual foraminal stenosis.  Since his surgery patient had ongoing right leg weakness and numbness neurology consulted for recommendations.  MRI of the brain showed no acute changes.  Neurology felt these findings consistent with either hypersensitivity to pain or mild component of nonorganic findings.  Recommendations at that time were for outpatient EMG of right lower extremity numbness persisted.  Therapy evaluations completed with back brace when out of bed.  Therapy evaluations completed and patient was admitted to inpatient rehab services 09/15/2024 for comprehensive rehab therapies.  During patient's hospitalization noted persistent pain and numbness of the right lower extremity earlier hospital course workup by  neurology was unremarkable at that time.  Vascular surgery Dr. Penne Colorado consulted 09/19/2024 for persistent leg pain.  ABIs were not able to be done due to patient's level of pain.  Monitoring of CK levels ranging from 3886-1434.  Diagnostic angiography completed 09/21/2024 per Dr. 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  CT angiography chest abdomen and pelvis completed showing stable infrarenal aortic aneurysm.  Due to patient's aortic occlusion findings he was discharged to acute care services 09/24/2024 and underwent right axillary bifemoral bypass graft with bilateral common femoral, superficial femoral and profundofemoral thrombectomy with redo of left common femoral artery exposure and 4 compartment right leg fasciotomy resection of necrotic muscle in the right anterior compartment with bilateral femoral enterectomy 09/24/2024 per Dr. Serene.  Patient did return to the OR same day 09/24/2024 due to popliteal signal being different than what it was initially in the operating room it appeared to be monophasic thus returned back for reexploration angiography to make sure blood flow is optimized and angiography revealed a filling defect in the popliteal artery at the level of the knee.  There was reconstitution of the posterior tibial artery which was dominant runoff.  Peroneal artery also opacified.  It was elected to reopen the medial right below-knee incision and dissected out the popliteal artery.  Patient tolerated the procedure well and was sent back to the ICU.  IV heparin  was initiated for arterial insufficiency.  Patient did require right calf lateral fasciotomy washout debridement of skin and soft tissue vacuum assisted dressing 09/27/2024 per vascular surgery Dr. Fonda Simpers followed by excisional debridement skin soft tissue muscle and fascia anterior compartment right leg.  Transfer of the lateral compartment muscles to the anterior compartment to cover the anterior tibial vessels 09/29/2024 per Dr. Harden and ultimately with right leg fasciotomy washout again completed 10/01/2024 and later completed again with excisional debridement right calf with excision of soft tissue muscle and fascia application of wound VAC 10/06/2024 per Dr. Harden....  Patient did require episodes of ongoing hypotension 10/10  order of 1 unit packed red blood cells for hemoglobin of 7.2 latest hemoglobin 7.7.  Hospital course patient remains on doxycycline for wound coverage.  IV heparin  ongoing he was cleared to resume low-dose aspirin .  Therapy evaluations have been completed and ongoing with slow progressive gains.  Patient was readmitted back to inpatient rehab services for comprehensive therapies.    He was subsequently taken to the OR on 09/24/2024 for right axillary femoral bypass graft, bilateral common femoral, superficial femoral and profundofemoral thrombectomy, right leg anterior compartment fasciotomy due to compartment syndrome, right femoral endarterectomy. femoral endarterectomy. Soon after coming out of the OR, he was found to have decrease in his right lower extremity pulses again. Seen by vascular surgery, taken to the OR again.  Underwent right leg angiogram, found to have thrombus in the popliteal artery, underwent successful thrombectomy. See by PT/OT post operatively and they recommend return to CIR.   Patient's medical record from [facility] has been reviewed by the rehabilitation admission coordinator and physician.  Past Medical History  Past Medical History:  Diagnosis Date   Anxiety    new dx   Arthritis    lumbar   Atherosclerotic vascular disease    Cataract    COPD (chronic obstructive pulmonary disease) (HCC)    Diverticulosis    Elevated PSA    Headache(784.0)    MIGRAINES   Hypertension 2021   Iritis    CHRONIC IN LEFT  EYE - SOME VISIAL IMPAIRMENT IN LEFT EYE   Neuromuscular disorder (HCC)    left leg/foot,pinched siactic nerve   Pain    PAIN LEFT HIP AND DOWN LT LEG WITH NUMBNESS IN LEFT LEG--PT STATES SCIATIC NERVE IMPINGEMENT - PT PLANS BACK IN THE NEAR SURGERY.   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 Yes  Family History   family history includes Cancer in his father and paternal grandfather.  Current Medications  Current Facility-Administered Medications:    albuterol  (PROVENTIL ) (2.5 MG/3ML) 0.083% nebulizer solution 3 mL, 3 mL, Inhalation, Q6H PRN, Gerome, Emma M, PA-C   alum & mag hydroxide-simeth (MAALOX/MYLANTA) 200-200-20 MG/5ML suspension 30 mL, 30 mL, Oral, Q6H PRN, Gerome, Emma M, PA-C   ascorbic acid  (VITAMIN C ) tablet 500 mg, 500 mg, Oral, Daily, Gerome Herring M, PA-C, 500 mg at 10/11/24 0941   aspirin  EC tablet 81 mg, 81 mg, Oral, Daily, Gerome Herring M, PA-C, 81 mg at 10/11/24 9058   budesonide -glycopyrrolate -formoterol  (BREZTRI ) 160-9-4.8 MCG/ACT inhaler 2 puff, 2 puff, Inhalation, BID, Rashid, Farhan, MD, 2 puff at 10/11/24 0854   citalopram  (CELEXA ) tablet 20 mg, 20 mg, Oral, Daily, Gerome Herring M, PA-C, 20 mg at 10/11/24 0941   doxycycline (VIBRA-TABS) tablet 100 mg, 100 mg, Oral, Q12H, Duda, Marcus V, MD, 100 mg at 10/11/24 0941   DULoxetine  (CYMBALTA ) DR capsule 30 mg, 30 mg, Oral, QHS, Collins, Emma M, PA-C, 30 mg at 10/10/24 2053   feeding supplement (ENSURE PLUS HIGH PROTEIN) liquid 237 mL, 237 mL, Oral, BID BM, Collins, Emma M, PA-C, 237 mL at 10/11/24 0951   heparin  ADULT infusion 100 units/mL (25000 units/250mL), 1,500 Units/hr, Intravenous, Continuous, Billy Rocky SAUNDERS, [facility], Last Rate: 15 mL/hr at 10/10/24 2045, 1,500 Units/hr at 10/10/24 2045   ibuprofen  (ADVIL ) tablet 400 mg, 400 mg, Oral, Q4H PRN, Rashid, Farhan, MD   loperamide (IMODIUM) capsule 2 mg, 2 mg, Oral, PRN, Gerome Herring M, PA-C, 2 mg at 10/05/24 2013   morphine  (PF) 2 MG/ML injection 2 mg, 2 mg, Intravenous, Q4H PRN, Gerome Herring M, PA-C, 2 mg at 10/07/24 1727   [START ON 10/12/2024] multivitamin with minerals tablet 1 tablet, 1 tablet, Oral, Q24H, Chen, Lydia D, RPH   naloxone  (NARCAN ) injection 0.4 mg, 0.4 mg,  Intravenous, PRN, Gerome, Emma M, PA-C   nutrition supplement (JUVEN) (JUVEN) powder packet 1 packet, 1 packet, Oral, BID BM, Gerome Herring HERO, PA-C, 1 packet at 10/11/24 9057   ondansetron  (ZOFRAN ) tablet 4 mg, 4 mg, Oral, Q6H PRN **OR** ondansetron  (ZOFRAN ) injection 4 mg, 4 mg, Intravenous, Q6H PRN, Gerome, Emma M, PA-C, 4 mg at 10/06/24 1022   Oral care mouth rinse, 15 mL, Mouth Rinse, PRN, Amin, Ankit C, MD   oxyCODONE  (Oxy IR/ROXICODONE ) immediate release tablet 10 mg, 10 mg, Oral, Q4H PRN, Gerome, Emma M, PA-C, 10 mg at 10/09/24 1336   oxyCODONE  (OXYCONTIN ) 12 hr tablet 10 mg, 10 mg, Oral, Q12H, Collins, Emma M, PA-C, 10 mg at 10/11/24 0941   polyethylene glycol (MIRALAX  / GLYCOLAX ) packet 17 g, 17 g, Oral, Daily, Rashid, Farhan, MD   potassium chloride  SA (KLOR-CON  M) CR tablet 40-60 mEq, 40-60 mEq, Oral, Daily PRN, Gerome Herring M, PA-C   pregabalin  (LYRICA ) capsule 150 mg,  150 mg, Oral, TID, Gerome Herring M, PA-C, 150 mg at 10/11/24 9058   rosuvastatin  (CRESTOR ) tablet 20 mg, 20 mg, Oral, Daily, Gerome Herring M, PA-C, 20 mg at 10/11/24 9058   senna-docusate (Senokot-S) tablet 1 tablet, 1 tablet, Oral, QHS, Rashid, Farhan, MD, 1 tablet at 10/10/24 2053   tamsulosin  (FLOMAX ) capsule 0.4 mg, 0.4 mg, Oral, QPC supper, Gerome Herring M, PA-C, 0.4 mg at 10/10/24 1806   thiamine (VITAMIN B1) tablet 100 mg, 100 mg, Oral, Daily, Gerome Herring M, PA-C, 100 mg at 10/11/24 9058   zinc  sulfate (50mg  elemental zinc ) capsule 220 mg, 220 mg, Oral, Daily, Gerome Herring M, PA-C, 220 mg at 10/11/24 9058   zolpidem  (AMBIEN ) tablet 5 mg, 5 mg, Oral, QHS PRN, Collins, Emma M, PA-C, 5 mg at 10/10/24 2052  Patients Current Diet:  Diet Order             Diet regular Room service appropriate? Yes with Assist; Fluid consistency: Thin  Diet effective now                   Precautions / Restrictions Precautions Precautions: Fall, Back, Other (comment) Precaution Booklet Issued: Yes  (comment) Precaution/Restrictions Comments: watch BP (orthostatic); RLE foot drop Spinal Brace: Thoracolumbosacral orthotic, Applied in sitting position Restrictions Weight Bearing Restrictions Per Provider Order: Yes RLE Weight Bearing Per Provider Order: Weight bearing as tolerated Other Position/Activity Restrictions: transfers only on RLE   Has the patient had 2 or more falls or a fall with injury in the past year? Yes  Prior Activity Level Community (5-7x/wk): Pt. active in the community PTA  Prior Functional Level Self Care: Did the patient need help bathing, dressing, using the toilet or eating? Independent  Indoor Mobility: Did the patient need assistance with walking from room to room (with or without device)? Independent  Stairs: Did the patient need assistance with internal or external stairs (with or without device)? Independent  Functional Cognition: Did the patient need help planning regular tasks such as shopping or remembering to take medications? Needed some help  Patient Information Are you of Hispanic, Latino/a,or Spanish origin?: A. No, not of Hispanic, Latino/a, or Spanish origin What is your race?: A. White Do you need or want an interpreter to communicate with a doctor or health care staff?: 0. No  Patient's Response To:  Health Literacy and Transportation Is the patient able to respond to health literacy and transportation needs?: Yes Health Literacy - How often do you need to have someone help you when you read instructions, pamphlets, or other written material from your doctor or pharmacy?: Never In the past 12 months, has lack of transportation kept you from medical appointments or from getting medications?: No In the past 12 months, has lack of transportation kept you from meetings, work, or from getting things needed for daily living?: No  Home Assistive Devices / Equipment Home Equipment: Agricultural consultant (2 wheels), Shower seat, Grab bars - tub/shower,  Medical laboratory scientific officer - single point, Adaptive equipment, Hand held shower head, Other (comment)  Prior Device Use: Indicate devices/aids used by the patient prior to current illness, exacerbation or injury? None of the above  Current Functional Level Cognition  Orientation Level: Oriented X4    Extremity Assessment (includes Sensation/Coordination)  Upper Extremity Assessment: Generalized weakness RUE Deficits / Details: weak grip strength, denies sensation changes. LUE Deficits / Details: weak grip strength, denies sensation changes.  Lower Extremity Assessment: Defer to PT evaluation RLE Deficits / Details: formal ROM and  strength assessment deferred due to pain. edema noted at R foot. flicker of ankle PF/DF noted RLE Sensation: decreased light touch LLE Deficits / Details: grossly 3/5 LLE Sensation: decreased light touch    ADLs  Overall ADL's : Needs assistance/impaired Eating/Feeding: Sitting, Set up Grooming: Set up, Sitting Upper Body Bathing: Set up, Sitting Lower Body Bathing: Maximal assistance, Bed level Upper Body Dressing : Minimal assistance Upper Body Dressing Details (indicate cue type and reason): donning back brace Lower Body Dressing: Maximal assistance, Bed level, +2 for safety/equipment Lower Body Dressing Details (indicate cue type and reason): To don brief with rolling Toilet Transfer: Maximal assistance, +2 for physical assistance, +2 for safety/equipment, Rolling walker (2 wheels), Stand-pivot Toilet Transfer Details (indicate cue type and reason): Stand, step pivot Toileting- Clothing Manipulation and Hygiene: Moderate assistance, +2 for safety/equipment Functional mobility during ADLs: Maximal assistance, +2 for physical assistance, +2 for safety/equipment, Rolling walker (2 wheels) General ADL Comments: Pt limited d/t soft BP    Mobility  Overal bed mobility: Needs Assistance Bed Mobility: Rolling, Sidelying to Sit Rolling: Supervision Sidelying to sit:  Supervision Sit to sidelying: Supervision General bed mobility comments: S for safety, good recall of log roll technique    Transfers  Overall transfer level: Needs assistance Equipment used: Rolling walker (2 wheels) Transfers: Sit to/from Stand, Bed to chair/wheelchair/BSC Sit to Stand: Mod assist, +2 physical assistance, +2 safety/equipment, From elevated surface Bed to/from chair/wheelchair/BSC transfer type:: Step pivot Stand pivot transfers: Mod assist, +2 physical assistance, +2 safety/equipment Squat pivot transfers: Mod assist Step pivot transfers: Max assist, +2 physical assistance, +2 safety/equipment, From elevated surface Transfer via Lift Equipment: Stedy General transfer comment: Pt with good muscle to power up with min-mod +2 assist from air bed and chair surfaces, during step pivot pt with knee buckling, requiring max+2 assist to complete step pivot and bil knees blocked at times.    Ambulation / Gait / Stairs / Wheelchair Mobility  Ambulation/Gait Ambulation/Gait assistance: Max assist, +2 physical assistance Gait Distance (Feet): 4 Feet Assistive device: Rolling walker (2 wheels) Gait Pattern/deviations: Shuffle, Decreased dorsiflexion - right, Knee flexed in stance - right, Knee flexed in stance - left, Knees buckling, Step-to pattern, Narrow base of support General Gait Details: Decreased bil knee extension in stance phase, pt needs AA for RLE while abducting for pivotal steps and R and at times L knee blocked due to pt c/f buckling/weakness, also assist for RW management and steadying assist. Stepping toward chair on his R side and posterior steps a couple feet. Stairs:  (not safe to attempt today 2/2 low BP/weakness)    Posture / Balance Dynamic Sitting Balance Sitting balance - Comments: L rail Balance Overall balance assessment: Needs assistance Sitting-balance support: Feet supported, No upper extremity supported Sitting balance-Leahy Scale: Fair Sitting  balance - Comments: L rail Standing balance support: Bilateral upper extremity supported, Reliant on assistive device for balance Standing balance-Leahy Scale: Poor Standing balance comment: Reliant on RW and external support    Special considerations/life events  Wound Vac yes   Previous Home Environment (from acute therapy documentation) Living Arrangements: Spouse/significant other  Lives With: Spouse Available Help at Discharge: Family, Available 24 hours/day Type of Home: House Home Layout: Two level Alternate Level Stairs-Number of Steps: pt does not access second level of home Home Access: Stairs to enter Entrance Stairs-Rails: Can reach both Entrance Stairs-Number of Steps: 4 Bathroom Shower/Tub: Health visitor: Handicapped height Bathroom Accessibility: Yes How Accessible: Accessible via walker Home Care  Services: No Additional Comments: bench in shower HH showerhead, grab bars in shower  Discharge Living Setting Plans for Discharge Living Setting: Patient's home Type of Home at Discharge: House Discharge Home Layout: One level Discharge Home Access: Stairs to enter Entrance Stairs-Rails: Can reach both Entrance Stairs-Number of Steps: 4 Discharge Bathroom Shower/Tub: Walk-in shower Discharge Bathroom Toilet: Handicapped height Discharge Bathroom Accessibility: Yes How Accessible: Accessible via walker Does the patient have any problems obtaining your medications?: No  Social/Family/Support Systems Patient Roles: Spouse Contact Information: Anfernee Peschke Anticipated Caregiver: Wife Ability/Limitations of Caregiver: Wife assisting prior, neighbor also assists Caregiver Availability: 24/7 Discharge Plan Discussed with Primary Caregiver: Yes Is Caregiver In Agreement with Plan?: Yes Does Caregiver/Family have Issues with Lodging/Transportation while Pt is in Rehab?: No  Goals Patient/Family Goal for Rehab: PT/OT supervision Expected length of  stay: 10-14 days Pt/Family Agrees to Admission and willing to participate: Yes Program Orientation Provided & Reviewed with Pt/Caregiver Including Roles  & Responsibilities: Yes  Decrease burden of Care through IP rehab admission: not anticipated  Possible need for SNF placement upon discharge: not anticipated  Patient Condition: I have reviewed medical records from [facility] , spoken with CM, and patient and family member. I met with patient at the bedside for inpatient rehabilitation assessment.  Patient will benefit from ongoing PT and OT, can actively participate in 3 hours of therapy a day 5 days of the week, and can make measurable gains during the admission.  Patient will also benefit from the coordinated team approach during an Inpatient Acute Rehabilitation admission.  The patient will receive intensive therapy as well as Rehabilitation physician, nursing, social worker, and care management interventions.  Due to safety, skin/wound care, disease management, medication administration, pain management, and patient education the patient requires 24 hour a day rehabilitation nursing.  The patient is currently mod A with mobility and basic ADLs.  Discharge setting and therapy post discharge at home with home health is anticipated.  Patient has agreed to participate in the Acute Inpatient Rehabilitation Program and will admit today.  Preadmission Screen Completed By:  Leita KATHEE Kleine, 10/11/2024 12:32 PM ______________________________________________________________________   Discussed status with Dr. Babs on 10/11/24 at 900 and received approval for admission today.  Admission Coordinator:  Leita KATHEE Kleine, CCC-SLP, time 1233/Date 10/11/24   Assessment/Plan: Diagnosis: lumbar myelopathy, PAD RLE with compartment syndrome Does the need for close, 24 hr/day Medical supervision in concert with the patient's rehab needs make it unreasonable for this patient to be served in a less  intensive setting? Yes Co-Morbidities requiring supervision/potential complications: wound care, behavior, pain mgt Due to bladder management, bowel management, safety, skin/wound care, disease management, medication administration, pain management, and patient education, does the patient require 24 hr/day rehab nursing? Yes Does the patient require coordinated care of a physician, rehab nurse, PT, OT, and SLP to address physical and functional deficits in the context of the above medical diagnosis(es)? Yes Addressing deficits in the following areas: balance, endurance, locomotion, strength, transferring, bowel/bladder control, bathing, dressing, feeding, grooming, toileting, cognition, and psychosocial support Can the patient actively participate in an intensive therapy program of at least 3 hrs of therapy 5 days a week? Yes The potential for patient to make measurable gains while on inpatient rehab is excellent Anticipated functional outcomes upon discharge from inpatient rehab: supervision to min assist PT, supervision to min assist OT, n/a SLP Estimated rehab length of stay to reach the above functional goals is: 13-20 days Anticipated discharge destination:  Home 10. Overall Rehab/Functional Prognosis: good   MD Signature: Arthea IVAR Gunther, MD, [facility] Physical Medicine & Rehabilitation Medical Director Rehabilitation Services 10/11/2024

## 2024-09-26 NOTE — Progress Notes (Signed)
 RN noticed patients left external jugular iv leaking,IV removed as per MD verbal order,pressure applied for 5 minutes ,site looks clean and dry

## 2024-09-26 NOTE — Progress Notes (Signed)
 PHARMACY - ANTICOAGULATION CONSULT NOTE  Pharmacy Consult for Heparin  Indication:  arterial insufficiency / occlusion  Allergies  Allergen Reactions   Ancef  [Cefazolin ] Anaphylaxis    Patient Measurements: Height: 5' 9 (175.3 cm) IBW/kg (Calculated) : 70.7  Vital Signs: Temp: 97.7 F (36.5 C) (09/28 0628) Temp Source: Oral (09/28 0628) BP: 114/86 (09/28 0700) Pulse Rate: 90 (09/28 0628)  Labs: Recent Labs    09/24/24 0318 09/24/24 1311 09/24/24 1415 09/25/24 0500 09/25/24 1531 09/26/24 0257  HGB 10.4*   < > 10.5* 7.5*  --  6.8*  HCT 31.0*   < > 31.0* 21.8*  --  20.5*  PLT 172  --   --  165  --  137*  HEPARINUNFRC 0.24*  --   --  <0.10* <0.10* <0.10*  CREATININE  --   --   --  0.92  --  1.28*  CKTOTAL 1,434*  --   --   --   --   --    < > = values in this interval not displayed.    Estimated Creatinine Clearance: 53.7 mL/min (A) (by C-G formula based on SCr of 1.28 mg/dL (H)).  Assessment: 72 YOM admitted w/ severe vascular insufficiency who is tentatively scheduled for right axillary bifemoral bypass w/ possible right common femoral to popliteal bypass. No PTA AC noted through a search of his EMR. Of note, he had recent lumbar fusion on 09/08/2024. Consult for heparin  therapy received 09/22/24.   9/24 RPh visited patient at bedside. He has a bruise that encompasses the entire backside of his left hand from a blood draw this morning. Patient stated the bruise developed to that size within 20 minutes of draw. Target a lower goal for heparin  and omit the load dose given recent surgery and the above observation.   S/p R popliteal and tibial thrombectomy on 9/26.  Subtherapeutic heparin  level of < 0.1 on 500 units/hr. Given pt is on a low fixed rate of 500 units/hr per MD will not change the rate.  Hgb 7.8 (s/p 1 unit pRBC 6.8>7.8) PLT 137. Pt received RBCs on 9/28. Per RN, his bruising has become more extensive but no outward signs of bleeding. No issues with infusion.    Goal of Therapy:  Heparin  level 0.3-0.5 units/ml Monitor platelets by anticoagulation protocol: Yes   Plan:  Continue heparin  infusion at 500 units/hr per consult request Check heparin  level in 8 hours with AM labs and daily while on heparin  Continue to monitor H&H and platelets  Thank you for allowing pharmacy to be a part of this patient's care.  Prentice DOROTHA Favors, PharmD PGY1 Health-System Pharmacy Administration and Leadership Resident [facility]  09/26/2024 7:14 AM

## 2024-09-26 NOTE — Progress Notes (Signed)
 Low hgb,  6.8  down trending over the last few days  was 10.5 2 days ago .  No obvious bleed noted  Will check fob , iron stores Will transfuse 1 unit prbc .  Patient recent hx of complicated vascular intervention and is currently on heparin   and asa.  As patient is stable  no obvious bleed will defer management of heparin  and antiplatelet to on coming attn and vascular.  Will need to see if patient h/h stabilizes after transfusion  Secondarily patient also noted to have decrease urine out put  Cr  1.28 up from baseline of 0.8. will also start gentle hydration.after prbc.

## 2024-09-26 NOTE — Plan of Care (Signed)
  Problem: Education: Goal: Knowledge of General Education information will improve Description: Including pain rating scale, medication(s)/side effects and non-pharmacologic comfort measures Outcome: Progressing   Problem: Health Behavior/Discharge Planning: Goal: Ability to manage health-related needs will improve Outcome: Progressing   Problem: Clinical Measurements: Goal: Cardiovascular complication will be avoided Outcome: Progressing   Problem: Elimination: Goal: Will not experience complications related to urinary retention Outcome: Progressing   Problem: Pain Managment: Goal: General experience of comfort will improve and/or be controlled Outcome: Progressing   Problem: Activity: Goal: Ability to tolerate increased activity will improve Outcome: Progressing   Problem: Skin Integrity: Goal: Demonstration of wound healing without infection will improve Outcome: Progressing

## 2024-09-26 NOTE — Evaluation (Signed)
 Occupational Therapy Evaluation Patient Details Name: David King MRN: [id number] DOB: 12/04/1956 Today's Date: 09/26/2024   History of Present Illness   68 y.o. 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 Pt went to OR x2 on 9/26 initially for R axial bypass, bilateral femoral thrombectomy and RLE fasciotomy and later for redo RLE thrombectomy. Pt recently underwent L4-5 decompression and fusion on 9/10 with anaphylactic reaction during surgery requiring ICU stay. PMHx: COPD, CAD, PAD, tobacco use, lumbar radiculopathy, detached retina L eye     Clinical Impressions David King was evaluated s/p the above admission list. He is indep at baseline, however since admission to [facility] he was walking short distances with PT and needing up to mod A for ADLs with OT. Upon evaluation the pt was limited by BLE pain, back precautions, weakness, mild dizziness, limited activity tolerance and balance. Overall he needed min-mod A for bed mobility and tolerated sitting for ~20 minutes with CGA-supervision A. Due to the deficits listed below the pt also needs up to total A for LB ADLs and set up A for UB ADLs. Pt will benefit from continued acute OT services and intensive inpatient follow up therapy, >3 hours/day after discharge.       If plan is discharge home, recommend the following:   A lot of help with walking and/or transfers;A lot of help with bathing/dressing/bathroom;Assistance with cooking/housework;Assist for transportation     Functional Status Assessment   Patient has had a recent decline in their functional status and demonstrates the ability to make significant improvements in function in a reasonable and predictable amount of time.     Equipment Recommendations   None recommended by OT     Recommendations for Other Services   Rehab consult      Precautions/Restrictions   Precautions Precautions: Fall;Back Precaution Booklet Issued: Yes (comment) Recall of Precautions/Restrictions: Intact Precaution/Restrictions Comments: watch BP (orthostatic) Required Braces or Orthoses: Spinal Brace Restrictions Weight Bearing Restrictions Per Provider Order: No     Mobility Bed Mobility Overal bed mobility: Needs Assistance Bed Mobility: Rolling, Sidelying to Sit, Sit to Sidelying Rolling: Min assist Sidelying to sit: Min assist     Sit to sidelying: Mod assist      Transfers                   General transfer comment: deferred due to pain      Balance Overall balance assessment: Needs assistance Sitting-balance support: Feet supported, Single extremity supported Sitting balance-Leahy Scale: Fair                                     ADL either performed or assessed with clinical judgement   ADL Overall ADL's : Needs assistance/impaired Eating/Feeding: Sitting;Set up   Grooming: Set up;Sitting   Upper Body Bathing: Set up;Sitting   Lower Body Bathing: Maximal assistance;Bed level   Upper Body Dressing : Set up;Sitting   Lower Body Dressing: Total assistance;Bed level   Toilet Transfer: Total assistance Toilet Transfer Details (indicate cue type and reason): bed level - pt able to manage urinal at the EOB Toileting- Clothing Manipulation and Hygiene: Moderate assistance;+2 for safety/equipment       Functional mobility during ADLs: Minimal assistance (bed level) General ADL Comments: limited to EOb on evaluation due to pain  Vision Baseline Vision/History: 1 Wears glasses Vision Assessment?: Vision impaired- to be further tested in functional context Additional Comments: L detatched retina     Perception Perception: Within Functional Limits       Praxis Praxis: WFL       Pertinent Vitals/Pain Pain Assessment Pain Assessment: Faces Faces Pain Scale: Hurts even  more Pain Location: groin incision and RLE Pain Descriptors / Indicators: Grimacing, Sharp, Cramping Pain Intervention(s): Limited activity within patient's tolerance, Monitored during session     Extremity/Trunk Assessment Upper Extremity Assessment Upper Extremity Assessment: Generalized weakness RUE Deficits / Details: weak grip strength, denies sensation changes. LUE Deficits / Details: weak grip strength, denies sensation changes.   Lower Extremity Assessment Lower Extremity Assessment: Defer to PT evaluation   Cervical / Trunk Assessment Cervical / Trunk Assessment: Back Surgery   Communication Communication Communication: No apparent difficulties   Cognition Arousal: Alert Behavior During Therapy: WFL for tasks assessed/performed Cognition: No apparent impairments                               Following commands: Intact Following commands impaired: Follows one step commands with increased time, Follows multi-step commands inconsistently     Cueing  General Comments   Cueing Techniques: Verbal cues;Visual cues  2L [place], VSS           Home Living Family/patient expects to be discharged to:: Private residence Living Arrangements: Spouse/significant other Available Help at Discharge: Family;Available 24 hours/day Type of Home: House Home Access: Stairs to enter [employer] of Steps: 4 Entrance Stairs-Rails: Can reach both Home Layout: Two level Alternate Level Stairs-Number of Steps: pt does not access second level of home   Bathroom Shower/Tub: Producer, television/film/video: Handicapped height Bathroom Accessibility: Yes How Accessible: Accessible via walker Home Equipment: Rolling Walker (2 wheels);Shower seat;Grab bars - tub/shower;Cane - single point;Adaptive equipment;Hand held shower head;Other (comment) Adaptive Equipment: Reacher Additional Comments: bench in shower HH showerhead, grab bars in shower  Lives With:  Spouse    Prior Functioning/Environment Prior Level of Function : Independent/Modified Independent;Driving             Mobility Comments: mod I with hurry cane at baseline - was walking ~20ft wtih RW and PT at [facility] ADLs Comments: at baseline - needing min-mod A for ADLs at [facility]    OT Problem List: Pain;Impaired balance (sitting and/or standing);Decreased activity tolerance;Decreased strength   OT Treatment/Interventions: Self-care/ADL training;Therapeutic exercise;Patient/family education;Balance training;Therapeutic activities;DME and/or AE instruction      OT Goals(Current goals can be found in the care plan section)   Acute Rehab OT Goals Patient Stated Goal: to rehab OT Goal Formulation: With patient Time For Goal Achievement: 09/24/24 Potential to Achieve Goals: Good ADL Goals Pt Will Perform Lower Body Dressing: with mod assist;sit to/from stand Pt Will Transfer to Toilet: with mod assist;stand pivot transfer;bedside commode Pt/caregiver will Perform Home Exercise Program: Increased ROM;Both right and left upper extremity;Increased strength;With written HEP provided Additional ADL Goal #1: Pt will sit EOB independently for 3 meals/day as a precursor to OOB ADLs   OT Frequency:  Min 2X/week       AM-PAC OT 6 Clicks Daily Activity     Outcome Measure Help from another person eating meals?: A Little Help from another person taking care of personal grooming?: A Little Help from another person toileting, which includes using toliet, bedpan, or urinal?: Total Help from another person bathing (  including washing, rinsing, drying)?: A Lot Help from another person to put on and taking off regular upper body clothing?: A Little Help from another person to put on and taking off regular lower body clothing?: Total 6 Click Score: 13   End of Session Equipment Utilized During Treatment: Oxygen  Nurse Communication: Mobility status;Precautions  Activity Tolerance: Patient  tolerated treatment well Patient left: in chair;with call bell/phone within reach;with chair alarm set;with family/visitor present  OT Visit Diagnosis: Unsteadiness on feet (R26.81);Other abnormalities of gait and mobility (R26.89);Muscle weakness (generalized) (M62.81);Pain Pain - Right/Left: Right Pain - part of body: Ankle and joints of foot                Time: 8679-8641 OT Time Calculation (min): 38 min Charges:  OT General Charges $OT Visit: 1 Visit OT Evaluation $OT Eval Moderate Complexity: 1 Mod OT Treatments $Self Care/Home Management : 8-22 mins $Therapeutic Activity: 8-22 mins  Lucie Kendall, OTR/L Acute Rehabilitation Services Office [phone] Secure Chat Communication Preferred   Lucie JONETTA Kendall 09/26/2024, 2:36 PM

## 2024-09-26 NOTE — Progress Notes (Signed)
 Inpatient Rehab Admissions Coordinator:    CIR consulted for possible return to CIR. Will need to see some progress with therapy but will go ahead and place consult.  Leita Kleine, MS, CCC-SLP Rehab Admissions Coordinator  [phone] (celll) [phone] (office)

## 2024-09-26 NOTE — Progress Notes (Signed)
 PROGRESS NOTE  David King  DOB: 1956-01-06  PCP: [name], MD FMW:[id number]  DOA: 09/24/2024  LOS: 2 days  Hospital Day: 3  Brief narrative: David King is a 68 y.o. male with PMH significant for HTN, HLD, PAD s/p b/l common [place] artery stenting, prostate cancer, COPD, diverticulosis, chronic back pain, lumbar radiculopathy, left eye with times detachment. Because of progressive radiculopathy pain, patient was following up with orthopedics as an outpatient.  He had previous L4-L5 disc decompression.  Recent MRI showed severe stenosis of the left L4-L5 due to large disc herniation.  09/08/2024, patient underwent revision L4-L5 decompression, lumbar body fusion by Dr. Beuford.  During the procedure, patient had an anaphylactic reaction and developed hives with some hypotension. He required short-term intubation and transferred to ICU, post recovery he was noted to have some improvement in his left leg pain, postop site looked okay but he developed some right lower extremity weakness.  Neurology was consulted.  MRI brain was unremarkable.  EMG showed nonspecific changes.  9/17, discharged to CIR. 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 9/26, patient was readmitted to acute care with a plan of revascularization.  Patient underwent right axillary femoral bypass graft, bilateral common femoral, superficial femoral and profundofemoral thrombectomy, right leg anterior compartment fasciotomy due to compartment syndrome, right femoral endarterectomy. Soon after coming out of the OR, he was found to have decrease in his right lower extremity pulses again. Seen by vascular surgery, taken to the OR again.  Underwent right leg angiogram, found to have thrombus in the popliteal artery,  underwent successful thrombectomy. Postop, admitted to [facility]  Subjective: Patient was seen and examined this morning. Lying on bed.  Not in distress.  On low-flow oxygen . Per RN, Foley catheter was removed per vascular surgery protocol yesterday.  Patient has been able to urinate since then.  Retaining urine.  Has required 3 in and outs so far. Last BM charted 9/26. Remains afebrile, heart rate 90s, blood pressure mostly normal range.  Breathing on room air Labs from this morning with hemoglobin low at 6.8, creatinine elevated 1.28.  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 9/26, patient was readmitted to acute care with a plan of revascularization.  Patient underwent right axillary femoral bypass graft, bilateral common femoral, superficial femoral and profundofemoral thrombectomy, right leg anterior compartment fasciotomy due to compartment syndrome, right femoral endarterectomy. Soon after coming out of the OR, he was found to have decrease in his right lower extremity pulses again. Seen by vascular surgery, taken to the OR again.  Underwent right leg angiogram, found to have thrombus in the popliteal artery, underwent successful thrombectomy. Currently on heparin  drip, aspirin  81 mg daily, statin Vascular surgery following.  Noted a plan to continue heparin  drip for today. Noted a plan of fasciotomy washout and closure tomorrow in the OR.  Lumbar radiculopathy S/p recent L4-L5 decompression/fusion -9/10 Dr Beuford PT/OT eval Pain regimen --- Scheduled:  OxyContin  10 mg twice daily,  Lyrica  150 mg 3 times daily --- PRN: Morphine  2 mg every 4 hours, oxycodone  10 mg every 4 hours  Acute blood loss anemia It seems, hemoglobin has been mildly running low since surgery on 9/10.  Acute drop noted since 2 surgeries on 9/26.   Hemoglobin low at 6.8 this morning.  1 PRBC transfusion given with improvement in hemoglobin to 7.8. Defer to vascular surgery for heparin  drip initiation Recent Labs    09/24/24 1311 09/24/24 1415 09/25/24 0500 09/26/24 0257 09/26/24 0852  HGB 8.8* 10.5* 7.5* 6.8* 7.8*  MCV  --   --  97.8 100.0  --    AKI Creatinine elevated 1.28 today.  Probably because of blood loss anemia and transient hypotension.   Continue IV hydration for the next 24 hours.   Repeat labs tomorrow Recent Labs    09/06/24 1100 09/08/24 1434 09/09/24 0530 09/16/24 0557 09/17/24 0507 09/18/24 1233 09/19/24 1312 09/22/24 0551 09/25/24 0500 09/26/24 0257  BUN 14 13 10  26* 20 19 18 14 10 11   CREATININE 1.18 1.03 0.98 0.83 0.98 0.72 0.87 0.88 0.92 1.28*  CO2 27 22 22 23 24  20* 25 24 23 25    Acute urinary retention H/o prostate cancer  Per RN, Foley catheter was removed per vascular surgery protocol yesterday.  Patient has been able to urinate since then.  Retaining urine.  Has required 3 in and outs so far. May need Foley catheter reinserted. Continue Flomax  as before.  Hyponatremia Mild.  Continue to monitor Recent Labs  Lab 09/19/24 1312 09/22/24 0551 09/24/24 1311 09/24/24 1415 09/25/24 0500 09/26/24 0257  NA 130* 131* 130* 130* 131* 133*   Hyperlipidemia Crestor  20 mg daily   COPD Bronchodilators as needed  Diverticulosis Encourage regular bowel regimen Currently on Colace 100 mg twice daily.  Anxiety/depression Celexa  20 mg daily, Cymbalta  nightly, Ambien  nightly as needed  Mobility:  PT Orders: Active   PT Follow up Rec: Acute Inpatient Rehab (3hours/Day)09/25/2024 1652   Goals of care   Code Status: Full  Code     DVT prophylaxis: Heparin  drip    Antimicrobials: None Fluid: Currently on NS at 75 mL per hour. Consultants: Vascular surgery Family Communication: None at bedside  Status: Inpatient Level of care:  Progressive   Patient is from: CIR Needs to continue in-hospital care: Remains on IV heparin  drip, pending wound closure in OR tomorrow Anticipated d/c to: Pending clinical course, PT eval      Diet:  Diet Order             Diet regular Fluid consistency: Thin  Diet effective now                   Scheduled Meds:  aspirin  EC  81 mg Oral Daily   citalopram   20 mg Oral Daily   docusate sodium   100 mg Oral BID   DULoxetine   30 mg Oral QHS   oxyCODONE   10 mg Oral Q12H   pregabalin   150 mg Oral TID   rosuvastatin   20 mg Oral Daily   tamsulosin   0.4 mg Oral QPC supper    PRN meds: acetaminophen , albuterol , morphine  injection, naLOXone  (NARCAN )  injection, ondansetron  **OR** ondansetron  (ZOFRAN ) IV, oxyCODONE , potassium chloride , senna-docusate, zolpidem    Infusions:   sodium chloride  75 mL/hr at 09/26/24 0424   heparin  500 Units/hr (09/24/24 2105)    Antimicrobials: Anti-infectives (From admission, onward)    Start     Dose/Rate Route Frequency Ordered Stop   09/24/24 2115  vancomycin  (VANCOCIN ) IVPB 1000 mg/200 mL premix        1,000 mg 200 mL/hr over 60 Minutes Intravenous Every 12 hours 09/24/24 2027 09/25/24 1030       Objective: Vitals:   09/26/24 0700 09/26/24 0816  BP: 114/86 (!) 146/57  Pulse:  91  Resp: 16 20  Temp:  97.8 F (36.6 C)  SpO2: 94% 98%    Intake/Output Summary (Last 24 hours) at 09/26/2024 1044 Last data filed at 09/26/2024 9373 Gross per 24 hour  Intake 1743.92 ml  Output 2525 ml  Net -781.08 ml   There were no vitals filed for this visit. Weight change:  Body mass index is 27.09 kg/m.   Physical Exam: General exam: Pleasant, elderly Caucasian male.  Not in pain Skin: No rashes, lesions or ulcers.  Has  bruises in multiple places HEENT: Atraumatic, normocephalic, no obvious bleeding Lungs: Clear to auscultation bilaterally,  CVS: S1, S2, no murmur,   GI/Abd: Soft, nontender, nondistended, bowel sound present,   CNS: Alert, awake, oriented x 3 Psychiatry: Sad affect Extremities: Right lower extremity on Ace wrap.  Exam of right lower extremity per vascular surgery   Data Review: I have personally reviewed the laboratory data and studies available.  F/u labs ordered Unresulted Labs (From admission, onward)     Start     Ordered   09/26/24 0500  Heparin  level (unfractionated)  Daily,   R      09/25/24 0741   09/25/24 0500  CBC with Differential/Platelet  Daily,   R     Question:  Specimen collection method  Answer:  IV Team=IV Team collect   09/24/24 1556   09/25/24 0500  Comprehensive metabolic panel with GFR  Daily,   R     Question:  Specimen collection method  Answer:  IV Team=IV Team collect   09/24/24 1556   09/25/24 0500  Magnesium   Daily,   R     Question:  Specimen collection method  Answer:  IV Team=IV Team collect   09/24/24 1556   09/25/24 0500  Phosphorus  Daily,   R     Question:  Specimen collection method  Answer:  IV Team=IV Team collect   09/24/24 1556            Signed, Chapman Rota, MD [facility] 09/26/2024

## 2024-09-27 ENCOUNTER — Encounter (HOSPITAL_COMMUNITY): Payer: Self-pay | Admitting: Certified Registered"

## 2024-09-27 ENCOUNTER — Encounter (HOSPITAL_COMMUNITY): Payer: Self-pay | Admitting: Surgery

## 2024-09-27 ENCOUNTER — Other Ambulatory Visit (HOSPITAL_COMMUNITY): Payer: Self-pay

## 2024-09-27 ENCOUNTER — Inpatient Hospital Stay (HOSPITAL_COMMUNITY): Admitting: Anesthesiology

## 2024-09-27 ENCOUNTER — Encounter (HOSPITAL_COMMUNITY)
Admission: RE | Disposition: A | Discharge status: Rehabilitation Facility | Payer: Self-pay | Source: Other Acute Inpatient Hospital | Attending: Internal Medicine

## 2024-09-27 DIAGNOSIS — I739 Peripheral vascular disease, unspecified: Secondary | ICD-10-CM | POA: Diagnosis not present

## 2024-09-27 DIAGNOSIS — I2510 Atherosclerotic heart disease of native coronary artery without angina pectoris: Secondary | ICD-10-CM

## 2024-09-27 DIAGNOSIS — Z95828 Presence of other vascular implants and grafts: Secondary | ICD-10-CM

## 2024-09-27 DIAGNOSIS — I10 Essential (primary) hypertension: Secondary | ICD-10-CM

## 2024-09-27 DIAGNOSIS — Z87891 Personal history of nicotine dependence: Secondary | ICD-10-CM

## 2024-09-27 DIAGNOSIS — Z9889 Other specified postprocedural states: Secondary | ICD-10-CM

## 2024-09-27 DIAGNOSIS — M79A29 Nontraumatic compartment syndrome of unspecified lower extremity: Secondary | ICD-10-CM

## 2024-09-27 DIAGNOSIS — S81801A Unspecified open wound, right lower leg, initial encounter: Secondary | ICD-10-CM

## 2024-09-27 HISTORY — PX: FASCIOTOMY CLOSURE: SHX5829

## 2024-09-27 HISTORY — PX: APPLICATION OF WOUND VAC: SHX5189

## 2024-09-27 HISTORY — PX: APPLICATION, SKIN SUBSTITUTE: SHX7530

## 2024-09-27 LAB — CBC WITH DIFFERENTIAL/PLATELET
Abs Immature Granulocytes: 0.06 K/uL (ref 0.00–0.07)
Basophils Absolute: 0 K/uL (ref 0.0–0.1)
Basophils Relative: 0 %
Eosinophils Absolute: 0.2 K/uL (ref 0.0–0.5)
Eosinophils Relative: 3 %
HCT: 23.8 % — ABNORMAL LOW (ref 39.0–52.0)
Hemoglobin: 7.5 g/dL — ABNORMAL LOW (ref 13.0–17.0)
Immature Granulocytes: 1 %
Lymphocytes Relative: 11 %
Lymphs Abs: 0.7 K/uL (ref 0.7–4.0)
MCH: 31.6 pg (ref 26.0–34.0)
MCHC: 31.5 g/dL (ref 30.0–36.0)
MCV: 100.4 fL — ABNORMAL HIGH (ref 80.0–100.0)
Monocytes Absolute: 0.5 K/uL (ref 0.1–1.0)
Monocytes Relative: 7 %
Neutro Abs: 4.9 K/uL (ref 1.7–7.7)
Neutrophils Relative %: 78 %
Platelets: 118 K/uL — ABNORMAL LOW (ref 150–400)
RBC: 2.37 MIL/uL — ABNORMAL LOW (ref 4.22–5.81)
RDW: 16.1 % — ABNORMAL HIGH (ref 11.5–15.5)
WBC: 6.4 K/uL (ref 4.0–10.5)
nRBC: 0 % (ref 0.0–0.2)

## 2024-09-27 LAB — COMPREHENSIVE METABOLIC PANEL WITH GFR
ALT: 40 U/L (ref 0–44)
AST: 40 U/L (ref 15–41)
Albumin: 2.1 g/dL — ABNORMAL LOW (ref 3.5–5.0)
Alkaline Phosphatase: 61 U/L (ref 38–126)
Anion gap: 5 (ref 5–15)
BUN: 12 mg/dL (ref 8–23)
CO2: 23 mmol/L (ref 22–32)
Calcium: 7.4 mg/dL — ABNORMAL LOW (ref 8.9–10.3)
Chloride: 103 mmol/L (ref 98–111)
Creatinine, Ser: 1.26 mg/dL — ABNORMAL HIGH (ref 0.61–1.24)
GFR, Estimated: >60 mL/min (ref >60)
Glucose, Bld: 94 mg/dL (ref 70–99)
Potassium: 3.9 mmol/L (ref 3.5–5.1)
Sodium: 131 mmol/L — ABNORMAL LOW (ref 135–145)
Total Bilirubin: 1 mg/dL (ref 0.0–1.2)
Total Protein: 4.9 g/dL — ABNORMAL LOW (ref 6.5–8.1)

## 2024-09-27 LAB — HEPARIN LEVEL (UNFRACTIONATED): Heparin Unfractionated: <0.1 [IU]/mL — ABNORMAL LOW (ref 0.30–0.70)

## 2024-09-27 LAB — MAGNESIUM: Magnesium: 2.3 mg/dL (ref 1.7–2.4)

## 2024-09-27 LAB — PHOSPHORUS: Phosphorus: 3.4 mg/dL (ref 2.5–4.6)

## 2024-09-27 LAB — POCT ACTIVATED CLOTTING TIME: Activated Clotting Time: 297 s

## 2024-09-27 SURGERY — FASCIOTOMY CLOSURE
Anesthesia: General | Site: Leg Lower | Laterality: Right

## 2024-09-27 MED ORDER — DEXAMETHASONE SODIUM PHOSPHATE 10 MG/ML IJ SOLN
INTRAMUSCULAR | Status: DC | PRN
  Administered 2024-09-27: 5 mg via INTRAVENOUS

## 2024-09-27 MED ORDER — ONDANSETRON HCL 4 MG/2ML IJ SOLN: 4.0000 mg | Freq: Once | INTRAMUSCULAR | Status: DC | PRN

## 2024-09-27 MED ORDER — HEPARIN (PORCINE) 25000 UT/250ML-% IV SOLN
1900.0000 [IU]/h | INTRAVENOUS | Status: DC
  Administered 2024-09-28: 1550 [IU]/h via INTRAVENOUS
  Filled 2024-09-27 (×2): qty 250

## 2024-09-27 MED ORDER — DROPERIDOL 2.5 MG/ML IJ SOLN: 0.6250 mg | Freq: Once | INTRAMUSCULAR | Status: DC | PRN

## 2024-09-27 MED ORDER — LIDOCAINE 2% (20 MG/ML) 5 ML SYRINGE
INTRAMUSCULAR | Status: DC | PRN
  Administered 2024-09-27: 80 mg via INTRAVENOUS

## 2024-09-27 MED ORDER — FENTANYL CITRATE (PF) 250 MCG/5ML IJ SOLN
INTRAMUSCULAR | Status: AC
  Filled 2024-09-27: qty 5

## 2024-09-27 MED ORDER — ONDANSETRON HCL 4 MG/2ML IJ SOLN
INTRAMUSCULAR | Status: DC | PRN
Start: 2024-09-27 — End: 2024-09-27
  Administered 2024-09-27: 4 mg via INTRAVENOUS

## 2024-09-27 MED ORDER — OXYCODONE HCL 5 MG/5ML PO SOLN: 5.0000 mg | Freq: Once | ORAL | Status: DC | PRN

## 2024-09-27 MED ORDER — PROPOFOL 10 MG/ML IV BOLUS
INTRAVENOUS | Status: AC
  Filled 2024-09-27: qty 20

## 2024-09-27 MED ORDER — HYDROMORPHONE HCL 1 MG/ML IJ SOLN: 0.2500 mg | INTRAMUSCULAR | Status: DC | PRN

## 2024-09-27 MED ORDER — OXYCODONE HCL 5 MG PO TABS: 5.0000 mg | ORAL_TABLET | Freq: Once | ORAL | Status: DC | PRN

## 2024-09-27 MED ORDER — PROPOFOL 10 MG/ML IV BOLUS
INTRAVENOUS | Status: DC | PRN
  Administered 2024-09-27: 140 mg via INTRAVENOUS

## 2024-09-27 MED ORDER — LACTATED RINGERS IV SOLN: INTRAVENOUS | Status: DC

## 2024-09-27 MED ORDER — CHLORHEXIDINE GLUCONATE 0.12 % MT SOLN
15.0000 mL | Freq: Once | OROMUCOSAL | Status: AC
  Administered 2024-09-27: 15 mL via OROMUCOSAL

## 2024-09-27 MED ORDER — SODIUM CHLORIDE 0.9 % IV SOLN: INTRAVENOUS | Status: DC

## 2024-09-27 MED ORDER — FENTANYL CITRATE (PF) 250 MCG/5ML IJ SOLN
INTRAMUSCULAR | Status: DC | PRN
  Administered 2024-09-27: 50 ug via INTRAVENOUS
  Administered 2024-09-27: 100 ug via INTRAVENOUS

## 2024-09-27 MED ORDER — ORAL CARE MOUTH RINSE: 15.0000 mL | Freq: Once | OROMUCOSAL | Status: AC

## 2024-09-27 MED ORDER — VANCOMYCIN HCL 1000 MG IV SOLR
INTRAVENOUS | Status: AC
  Filled 2024-09-27: qty 20

## 2024-09-27 MED ORDER — VANCOMYCIN HCL 1000 MG IV SOLR
INTRAVENOUS | Status: DC | PRN
  Administered 2024-09-27: 1000 mg via INTRAVENOUS

## 2024-09-27 MED ORDER — 0.9 % SODIUM CHLORIDE (POUR BTL) OPTIME
TOPICAL | Status: DC | PRN
  Administered 2024-09-27: 1000 mL

## 2024-09-27 MED ORDER — PHENYLEPHRINE HCL-NACL 20-0.9 MG/250ML-% IV SOLN
INTRAVENOUS | Status: DC | PRN
  Administered 2024-09-27: 50 ug/min via INTRAVENOUS

## 2024-09-27 SURGICAL SUPPLY — 30 items
BAG COUNTER SPONGE SURGICOUNT (BAG) ×2 IMPLANT
CANISTER SUCTION 3000ML PPV (SUCTIONS) ×2 IMPLANT
CANISTER WOUNDNEG PRESSURE 500 (CANNISTER) IMPLANT
CLIP TI MEDIUM 6 (CLIP) ×2 IMPLANT
CLIP TI WIDE RED SMALL 6 (CLIP) ×2 IMPLANT
COVER SURGICAL LIGHT HANDLE (MISCELLANEOUS) IMPLANT
DRAPE SURG ORHT 6 SPLT 77X108 (DRAPES) IMPLANT
DRESSING MORCELLS FINE 1000 (Tissue) IMPLANT
DRSG VAC GRANUFOAM LG (GAUZE/BANDAGES/DRESSINGS) IMPLANT
DRSG VERSA FOAM LRG 10X15 (GAUZE/BANDAGES/DRESSINGS) IMPLANT
ELECTRODE REM PT RTRN 9FT ADLT (ELECTROSURGICAL) ×2 IMPLANT
GAUZE SPONGE 4X4 12PLY STRL (GAUZE/BANDAGES/DRESSINGS) ×2 IMPLANT
GLOVE BIOGEL PI IND STRL 8 (GLOVE) ×2 IMPLANT
GOWN STRL REUS W/ TWL LRG LVL3 (GOWN DISPOSABLE) ×6 IMPLANT
GOWN STRL REUS W/TWL 2XL LVL3 (GOWN DISPOSABLE) ×4 IMPLANT
KIT BASIN OR (CUSTOM PROCEDURE TRAY) ×2 IMPLANT
KIT TURNOVER KIT B (KITS) ×2 IMPLANT
PACK GENERAL/GYN (CUSTOM PROCEDURE TRAY) ×2 IMPLANT
PACK UNIVERSAL I (CUSTOM PROCEDURE TRAY) ×2 IMPLANT
PAD ARMBOARD POSITIONER FOAM (MISCELLANEOUS) ×4 IMPLANT
SOLN 0.9% NACL 1000 ML (IV SOLUTION) ×1 IMPLANT
SOLN 0.9% NACL POUR BTL 1000ML (IV SOLUTION) ×2 IMPLANT
SOLN STERILE WATER 1000 ML (IV SOLUTION) ×1 IMPLANT
SOLN STERILE WATER BTL 1000 ML (IV SOLUTION) ×2 IMPLANT
SUT ETHILON 2 0 PSLX (SUTURE) IMPLANT
SUT MNCRL AB 4-0 PS2 18 (SUTURE) IMPLANT
SUT VIC AB 2-0 CT1 TAPERPNT 27 (SUTURE) IMPLANT
SUT VIC AB 2-0 CTX 36 (SUTURE) IMPLANT
SUT VIC AB 3-0 SH 27X BRD (SUTURE) IMPLANT
TOWEL GREEN STERILE (TOWEL DISPOSABLE) ×2 IMPLANT

## 2024-09-27 NOTE — Progress Notes (Signed)
 EOS  Steady UOP overnight, but difficult for pt - States it is getting harder and harder to pee and is concerned that he might have a UTI

## 2024-09-27 NOTE — Progress Notes (Signed)
   09/27/24 1624  TOC Brief Assessment  Insurance and Status Reviewed  Patient has primary care physician Yes  Home environment has been reviewed from home w/ spouse  Prior level of function: readmitted from CIR- mod. independent  Prior/Current Home Services No current home services  Social Drivers of Health Review SDOH reviewed no interventions necessary  Readmission risk has been reviewed Yes  Transition of care needs transition of care needs identified, TOC will continue to follow     Pt readmitted from CIR for FemPop, returned to OR today for fasciotomy and VAC placement- note potential plan to return to OR later this week. CIR liaison following for potential re-admit to CIR once medically cleared. Pt will need insurance auth again to return.   Inpatient Care Management (ICM) will continue to monitor patient advancement through interdisciplinary progression rounds. If new patient transition needs arise, please place a ICM (CM/CSW) consult.

## 2024-09-27 NOTE — Anesthesia Postprocedure Evaluation (Signed)
 Anesthesia Post Note  Patient: ZHYON ANTENUCCI  Procedure(s) Performed: RIGHT LOWER EXTREMITY FASCIOTOMY WASHOUT (Right: Leg Lower) APPLICATION, WOUND VAC RIGHT LOWER EXTREMITY (Right: Leg Lower) APPLICATION, MYRIAD SKIN SUBSTITUTE (Right: Leg Lower)     Patient location during evaluation: PACU Anesthesia Type: General Level of consciousness: awake and alert and oriented Pain management: pain level controlled Vital Signs Assessment: post-procedure vital signs reviewed and stable Respiratory status: spontaneous breathing, nonlabored ventilation and respiratory function stable Cardiovascular status: blood pressure returned to baseline and stable Postop Assessment: no apparent nausea or vomiting Anesthetic complications: no   No notable events documented.  Last Vitals:  Vitals:   09/27/24 1245 09/27/24 1300  BP: 125/67 119/62  Pulse: 92 85  Resp: 15 11  Temp: 36.7 C   SpO2: 94% 95%    Last Pain:  Vitals:   09/27/24 1315  TempSrc:   PainSc: Asleep                 Niamh Rada A.

## 2024-09-27 NOTE — Plan of Care (Signed)
  Problem: Education: Goal: Knowledge of General Education information will improve Description: Including pain rating scale, medication(s)/side effects and non-pharmacologic comfort measures Outcome: Progressing   Problem: Health Behavior/Discharge Planning: Goal: Ability to manage health-related needs will improve Outcome: Progressing   Problem: Clinical Measurements: Goal: Ability to maintain clinical measurements within normal limits will improve Outcome: Progressing Goal: Respiratory complications will improve Outcome: Progressing Goal: Cardiovascular complication will be avoided Outcome: Progressing   Problem: Activity: Goal: Risk for activity intolerance will decrease Outcome: Progressing   Problem: Elimination: Goal: Will not experience complications related to urinary retention Outcome: Progressing   Problem: Pain Managment: Goal: General experience of comfort will improve and/or be controlled Outcome: Progressing

## 2024-09-27 NOTE — Op Note (Addendum)
    NAME: David King    MRN: [id number] DOB: [birth year]    DATE OF OPERATION: 09/27/2024  PREOP DIAGNOSIS:    Open right calf lateral fasciotomy incision  POSTOP DIAGNOSIS:    Same  PROCEDURE:    Right calf lateral fasciotomy washout, debridement of skin and soft tissue 2000 mg myriad morsel powder placement Vacuum-assisted dressing, white foam, black foam Total wound size 26 x 7 x 3cm   SURGEON: Fonda FORBES Rim  ASSIST: Donnice Sender, PA  ANESTHESIA: General  EBL: 5 mL  INDICATIONS:    David King is a 68 y.o. male with prolonged hospital stay currently with recent axillobifemoral bypass graft, for acute aortic occlusion.  On fasciotomy.  There was necrosis of the anterior compartment.  This was all resected.  Peretz presents today for right leg lateral fasciotomy washout, possible closure.  FINDINGS:   Healthy lateral compartment with no necrosis. Anterior compartment with dermal necrosis which was resected Signal within the anterior tibial artery, posterior tibial artery, dorsalis pedis artery.  TECHNIQUE:   Patient was brought to the OR laid in supine position.  General anesthesia was induced and patient was prepped draped in standard fashion.  The case began with examination of the lateral fasciotomy site, more specifically the anterior compartment and lateral compartments.  The lateral compartment demonstrated healthy muscle.  The anterior compartment had already had the muscle belly resected, and the artery was in the base of the wound bed.  A duplex ultrasound was brought to the field and there was fluid within the artery.  Unfortunately, more superficially, the dermis was necrotic.  I used scissors to remove the necrotic dermis.  After removal, there was no way to close the compartment primarily.  Due to continue necrosis of the skin, I elected to place white foam, followed by black foam and VAC, with plans to return to the OR later this week.  In the interim, my  plan is to discuss his care with my vascular surgery colleagues as well as orthopedic surgery as I think that the artery will require definitive coverage whether be with muscle belly or biologic skin substitute.  The patient was taken to PACU in stable condition.  Signals were appreciated in both the dorsalis pedis and posterior tibial arteries and case completion.  An assistant was needed, specifically for vacuum-assisted dressing placement due to the large size wound bed.  Fonda FORBES Rim, MD [facility] DATE OF DICTATION:   09/27/2024

## 2024-09-27 NOTE — Progress Notes (Signed)
 Patient was evaluated at his bedside, after having had his additional leg debridement procedure with attempted closure.  He continues to be quite sedated from surgery.  His wound VAC is in place.  I did discuss the surgery with his surgeon, Dr. Lanis.  It appears due to additional skin and muscle necrosis, coverage of his wound was not achievable today.  Dr. Lanis is planning to discuss the patient's situation with his colleagues.  No active issues currently specific to his lumbar surgery.  He is aware of his restrictions in that regard.  Will continue to follow the patient's progress from the standpoint of his vascular thrombus/bypass procedure.

## 2024-09-27 NOTE — Progress Notes (Signed)
 PHARMACY - ANTICOAGULATION CONSULT NOTE  Pharmacy Consult for Heparin  Indication:  arterial insufficiency / occlusion  Allergies  Allergen Reactions   Ancef  [Cefazolin ] Anaphylaxis    Patient Measurements: Height: 5' 9 (175.3 cm) IBW/kg (Calculated) : 70.7  Vital Signs: Temp: 97.6 F (36.4 C) (09/29 1400) Temp Source: Oral (09/29 1400) BP: 112/59 (09/29 1400) Pulse Rate: 78 (09/29 1400)  Labs: Recent Labs    09/25/24 0500 09/25/24 1531 09/26/24 0257 09/26/24 0852 09/27/24 0349  HGB 7.5*  --  6.8* 7.8* 7.5*  HCT 21.8*  --  20.5* 23.7* 23.8*  PLT 165  --  137*  --  118*  HEPARINUNFRC <0.10* <0.10* <0.10*  --  <0.10*  CREATININE 0.92  --  1.28*  --  1.26*    Estimated Creatinine Clearance: 54.6 mL/min (A) (by C-G formula based on SCr of 1.26 mg/dL (H)).  Assessment: 17 YOM admitted w/ severe vascular insufficiency who is tentatively scheduled for right axillary bifemoral bypass w/ possible right common femoral to popliteal bypass. No PTA AC noted through a search of his EMR. Of note, he had recent lumbar fusion on 09/08/2024. Consult for heparin  therapy received 09/22/24/. Patient subsequently transferred from Cir to inpatient on 9/25. Heparin  decreased to 500 units/hr per [facility].   Patient now post op from Right calf lateral fasciotomy washout, debridement of skin and soft tissue. D/W Dr. Lanis, will resume therapeutic heparin  tonight at 2100. Pharmacy to titrate rate. Will resume same gaol of 0.3-0.5 as previous. Patient previously therpeutic (HL 0.41) on rate of 1550 units/hr. Will resume this rate at 2100      Goal of Therapy:  Heparin  level 0.3-0.5 units/ml Monitor platelets by anticoagulation protocol: Yes   Plan:  Increase heparin  from 500 to 1550 units/hr at 2100 tonight Heparin  level ~8 hrs after rate change. Daily heparin  level and CBC. . Undra Harriman A. Lyle, PharmD, BCPS, FNKF Clinical Pharmacist [place] Please utilize Amion for appropriate phone number  to reach the unit pharmacist [facility])  09/27/2024,2:24 PM

## 2024-09-27 NOTE — Progress Notes (Signed)
 Pt denies having wiped with CHG wipes or Hebiclens wash last night nor this morning.

## 2024-09-27 NOTE — Progress Notes (Addendum)
 Progress Note    09/27/2024 7:29 AM 3 Days Post-Op  Subjective: Says his pain in the right leg is getting better.  Currently most of his pain is in the lower leg and around the heel   Vitals:   09/27/24 0300 09/27/24 0400  BP: 107/60 112/63  Pulse:    Resp: 16 18  Temp: 98.4 F (36.9 C)   SpO2: 95% 93%    Physical Exam: General: Resting, NAD Cardiac: Regular Lungs: Nonlabored Incisions: Right axillary and bilateral groin incisions healing well.  Right lower leg bandaged and dry Extremities: Brisk PT Doppler signals bilaterally.  No motor or sensation in the right foot, unchanged  CBC    Component Value Date/Time   WBC 6.4 09/27/2024 0349   RBC 2.37 (L) 09/27/2024 0349   HGB 7.5 (L) 09/27/2024 0349   HCT 23.8 (L) 09/27/2024 0349   PLT 118 (L) 09/27/2024 0349   MCV 100.4 (H) 09/27/2024 0349   MCH 31.6 09/27/2024 0349   MCHC 31.5 09/27/2024 0349   RDW 16.1 (H) 09/27/2024 0349   LYMPHSABS 0.7 09/27/2024 0349   MONOABS 0.5 09/27/2024 0349   EOSABS 0.2 09/27/2024 0349   BASOSABS 0.0 09/27/2024 0349    BMET    Component Value Date/Time   NA 131 (L) 09/27/2024 0349   K 3.9 09/27/2024 0349   CL 103 09/27/2024 0349   CO2 23 09/27/2024 0349   GLUCOSE 94 09/27/2024 0349   BUN 12 09/27/2024 0349   CREATININE 1.26 (H) 09/27/2024 0349   CREATININE 1.06 10/10/2020 0915   CALCIUM  7.4 (L) 09/27/2024 0349   GFRNONAA >60 09/27/2024 0349   GFRNONAA 73 10/10/2020 0915   GFRAA 84 10/10/2020 0915    INR    Component Value Date/Time   INR 1.2 08/22/2022 0833     Intake/Output Summary (Last 24 hours) at 09/27/2024 0729 Last data filed at 09/27/2024 0500 Gross per 24 hour  Intake 720 ml  Output 2485 ml  Net -1765 ml      Assessment/Plan:  68 y.o. male is 3 days postop, s/p: #1: Right axillary bifemoral bypass graft                       #2: Bilateral common femoral, superficial femoral, and profundofemoral thrombectomy                       #3: Redo left common  femoral artery exposure                       #4: 4 compartment right leg fasciotomy                       #5: Resection of necrotic muscle in the right anterior compartment                       #6: Bilateral femoral endarterectomy   With same-day takeback for right popliteal artery exposure, right popliteal thrombectomy, right tibial thrombectomy  -He continues to have pain in the right lower leg, however this is improving -Right axillary and bilateral groin incisions are healing appropriately -Bilateral lower extremities well-perfused with brisk PT Doppler signals -Unchanged exam of the right foot, no motor or sensation -Hemoglobin improved to 7.5 after 1U PRBCs yesterday -He has been n.p.o. past midnight.  Will plan for right lower extremity lateral fasciotomy washout and possible closure today.  Please hold heparin   on-call to the OR.   Ahmed Holster, PA-C [facility] [phone] 09/27/2024 7:29 AM  VASCULAR STAFF ADDENDUM: I have independently interviewed and examined the patient. I agree with the above.  Patient with open fasciotomy.  We discussed possible closure versus washout, and vacuum-assisted dressing placement.  Due to the large tissue defect from the anterior compartment muscle excision, I am worried that primary closure will not heal, and will be a nidus for infection.  Will make the decision intraoperatively.  After discussing the risks and benefits of the above, Criss elected to proceed.   Fonda FORBES Rim MD [facility] Phone Number: [phone] 09/27/2024 11:25 AM

## 2024-09-27 NOTE — Progress Notes (Signed)
 PHARMACY - ANTICOAGULATION CONSULT NOTE  Pharmacy Consult for Heparin  Indication:  arterial insufficiency / occlusion  Allergies  Allergen Reactions   Ancef  [Cefazolin ] Anaphylaxis    Patient Measurements: Height: 5' 9 (175.3 cm) IBW/kg (Calculated) : 70.7  Vital Signs: Temp: 98.4 F (36.9 C) (09/29 0300) Temp Source: Oral (09/29 0300) BP: 112/63 (09/29 0400)  Labs: Recent Labs    09/25/24 0500 09/25/24 1531 09/26/24 0257 09/26/24 0852 09/27/24 0349  HGB 7.5*  --  6.8* 7.8* 7.5*  HCT 21.8*  --  20.5* 23.7* 23.8*  PLT 165  --  137*  --  118*  HEPARINUNFRC <0.10* <0.10* <0.10*  --  <0.10*  CREATININE 0.92  --  1.28*  --  1.26*    Estimated Creatinine Clearance: 54.6 mL/min (A) (by C-G formula based on SCr of 1.26 mg/dL (H)).  Assessment: 73 YOM admitted w/ severe vascular insufficiency who is tentatively scheduled for right axillary bifemoral bypass w/ possible right common femoral to popliteal bypass. No PTA AC noted through a search of his EMR. Of note, he had recent lumbar fusion on 09/08/2024. Consult for heparin  therapy received 09/22/24.   9/24 RPh visited patient at bedside. He has a bruise that encompasses the entire backside of his left hand from a blood draw this morning. Patient stated the bruise developed to that size within 20 minutes of draw. Target a lower goal for heparin  and omit the load dose given recent surgery and the above observation.   S/p R popliteal and tibial thrombectomy on 9/26.  Subtherapeutic heparin  level of < 0.1 on 500 units/hr. Given pt is on a low fixed rate of 500 units/hr per MD will not change the rate.  Hgb 7.8>>7.5 (s/p 1 unit pRBC 6.8>7.8) PLT 137>118. Pt received RBCs on 9/28. Per RN, his bruising has become more extensive but no outward signs of bleeding. No issues with infusion.   Patient back to OR today 9/29 per VVS  Goal of Therapy:  Heparin  level 0.3-0.5 units/ml Monitor platelets by anticoagulation protocol: Yes    Plan:  Continue heparin  infusion at 500 units/hr per consult request Will wait for orders from [facility]VS to titrate heparin  drip Check heparin  level with AM labs daily while on heparin  Continue to monitor H&H and platelets  Nellie Chevalier A. Lyle, PharmD, BCPS, FNKF Clinical Pharmacist [place] Please utilize Amion for appropriate phone number to reach the unit pharmacist [facility])   09/27/2024 7:14 AM

## 2024-09-27 NOTE — Progress Notes (Signed)
 Inpatient Rehab Admissions Coordinator:    Following for possible return to CIR. In OR today, will follow up post op.  Leita Kleine, MS, CCC-SLP Rehab Admissions Coordinator  [phone] (celll) [phone] (office)

## 2024-09-27 NOTE — Transfer of Care (Signed)
 Immediate Anesthesia Transfer of Care Note  Patient: David King  Procedure(s) Performed: RIGHT LOWER EXTREMITY FASCIOTOMY WASHOUT (Right: Leg Lower) APPLICATION, WOUND VAC RIGHT LOWER EXTREMITY (Right: Leg Lower) APPLICATION, MYRIAD SKIN SUBSTITUTE (Right: Leg Lower)  Patient Location: PACU  Anesthesia Type:General  Level of Consciousness: awake, drowsy, patient cooperative, and responds to stimulation  Airway & Oxygen  Therapy: Patient Spontanous Breathing and Patient connected to nasal cannula oxygen   Post-op Assessment: Report given to RN and Post -op Vital signs reviewed and stable  Post vital signs: Reviewed and stable  Last Vitals:  Vitals Value Taken Time  BP 125/67 09/27/24 12:45  Temp 36.7 C 09/27/24 12:45  Pulse 88 09/27/24 12:45  Resp 12 09/27/24 12:45  SpO2 94 % 09/27/24 12:45  Vitals shown include unfiled device data.  Last Pain:  Vitals:   09/27/24 1017  TempSrc: Oral  PainSc: 5       Patients Stated Pain Goal: 0 (09/26/24 1411)  Complications: No notable events documented.

## 2024-09-27 NOTE — Anesthesia Preprocedure Evaluation (Addendum)
 Anesthesia Evaluation  Patient identified by MRN, date of birth, ID band Patient awake    Reviewed: Allergy  & Precautions, NPO status , Patient's Chart, lab work & pertinent test results, reviewed documented beta blocker date and time   Airway Mallampati: II  TM Distance: >3 FB     Dental  (+) Teeth Intact, Dental Advisory Given, Caps, Missing   Pulmonary COPD,  COPD inhaler, former smoker   Pulmonary exam normal breath sounds clear to auscultation       Cardiovascular hypertension, Pt. 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    Anesthesia Other Findings   Reproductive/Obstetrics                              Anesthesia Physical Anesthesia Plan  ASA: 3  Anesthesia Plan: General   Post-op Pain Management:    Induction: Intravenous  PONV Risk Score and Plan: 4 or greater and Treatment may vary due to age or medical condition, Ondansetron , Propofol  infusion and Dexamethasone   Airway Management Planned: LMA  Additional Equipment: None  Intra-op Plan:   Post-operative Plan: Extubation in OR  Informed Consent: I have reviewed the patients History and Physical, chart, labs and discussed the procedure including the risks, benefits and alternatives for the proposed anesthesia with the patient or authorized representative who has indicated his/her understanding and acceptance.      Dental advisory given  Plan Discussed with: CRNA and Anesthesiologist  Anesthesia Plan Comments:          Anesthesia Quick Evaluation

## 2024-09-27 NOTE — Progress Notes (Signed)
 PROGRESS NOTE  PEARLY APACHITO  DOB: Apr 03, 1956  PCP: [name], MD FMW:[id number]  DOA: 09/24/2024  LOS: 3 days  Hospital Day: 4  Brief narrative: TANNER YELEY is a 68 y.o. male with PMH significant for HTN, HLD, PAD s/p b/l common [place] artery stenting, prostate cancer, COPD, diverticulosis, chronic back pain, lumbar radiculopathy, left eye with times detachment. Because of progressive radiculopathy pain, patient was following up with orthopedics as an outpatient.  He had previous L4-L5 disc decompression.  Recent MRI showed severe stenosis of the left L4-L5 due to large disc herniation.  09/08/2024, patient underwent revision L4-L5 decompression, lumbar body fusion by Dr. Beuford.  During the procedure, patient had an anaphylactic reaction and developed hives with some hypotension. He required short-term intubation and transferred to ICU, post recovery he was noted to have some improvement in his left leg pain, postop site looked okay but he developed some right lower extremity weakness.  Neurology was consulted.  MRI brain was unremarkable.  EMG showed nonspecific changes.  9/17, discharged to CIR. 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 9/26, patient was readmitted to acute care with a plan of revascularization.  Patient underwent right axillary femoral bypass graft, bilateral common femoral, superficial femoral and profundofemoral thrombectomy, right leg anterior compartment fasciotomy due to compartment syndrome, right femoral endarterectomy. Soon after coming out of the OR, he was found to have decrease in his right lower extremity pulses again. Seen by vascular surgery, taken to the OR again.  Underwent right leg angiogram, found to have thrombus in the popliteal artery,  underwent successful thrombectomy. Postop, admitted to [facility]  Subjective: Patient was seen and examined this morning. Lying on bed.  Not in distress.   Reports improving pain in the right leg Being prepared for surgery today. 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 9/26, patient was readmitted to acute care with a plan of revascularization.  Patient underwent right axillary femoral bypass graft, bilateral common femoral, superficial femoral and profundofemoral thrombectomy, right leg anterior compartment fasciotomy due to compartment syndrome, right femoral endarterectomy. Soon after coming out of the OR, he was found to have decrease in his right lower extremity pulses again. Seen by vascular surgery, taken to the OR again.  Underwent right leg angiogram, found to have thrombus in the popliteal artery, underwent successful thrombectomy. Currently on heparin  drip, aspirin  81 mg daily, statin Vascular surgery following.  Per vascular note, bilateral lower extremities are well-perfused with brisk PT Doppler signals..  But right foot with no motor or sensory function.   Noted plan of fasciotomy washout and possible closure today in the OR.  Lumbar radiculopathy S/p recent L4-L5 decompression/fusion -9/10 Dr Beuford PT/OT eval Pain regimen --- Scheduled: OxyContin  10 mg twice daily, Lyrica  150 mg 3 times daily --- PRN:  Morphine  2  mg every 4 hours, oxycodone  10 mg every 4 hours  Acute blood loss anemia It seems, hemoglobin has been mildly running low since surgery on 9/10.  Acute drop noted since 2 surgeries on 9/26.   Lowest hemoglobin of 6.8 on 9/28.  1 unit PRBC transfusion given with improvement.  Hemoglobin at 7.5 today.  May need more transfusion after surgery today.  Continue to monitor Recent Labs    09/24/24 1415 09/25/24 0500 09/26/24 0257 09/26/24 0852 09/27/24 0349  HGB 10.5* 7.5* 6.8* 7.8* 7.5*  MCV  --  97.8 100.0  --  100.4*   AKI Creatinine elevated 1.28 on 9/28.  Probably because of blood loss anemia and transient hypotension.   Gradual improvement with IV fluid.  I would continue IV hydration for now. Recent Labs    09/08/24 1434 09/09/24 0530 09/16/24 0557 09/17/24 0507 09/18/24 1233 09/19/24 1312 09/22/24 0551 09/25/24 0500 09/26/24 0257 09/27/24 0349  BUN 13 10 26* 20 19 18 14 10 11 12   CREATININE 1.03 0.98 0.83 0.98 0.72 0.87 0.88 0.92 1.28* 1.26*  CO2 22 22 23 24  20* 25 24 23 25 23    Acute urinary retention H/o prostate cancer  Per RN, Foley catheter was removed per vascular surgery protocol on 9/27.  Required a couple of In-N-Out's.  Able to void per RN.  Continue monitor postvoid residual. Continue Flomax  as before.  Hyponatremia Mild.  Continue to monitor Recent Labs  Lab 09/22/24 0551 09/24/24 1311 09/24/24 1415 09/25/24 0500 09/26/24 0257 09/27/24 0349  NA 131* 130* 130* 131* 133* 131*   Hyperlipidemia Crestor  20 mg daily   COPD Bronchodilators as needed  Diverticulosis Encourage regular bowel regimen Currently on Colace 100 mg twice daily.  Anxiety/depression Celexa  20 mg daily, Cymbalta  nightly, Ambien  nightly as needed  Mobility:  PT Orders: Active   PT Follow up Rec: Acute Inpatient Rehab (3hours/Day)09/25/2024 1652   Goals of care   Code Status: Full Code     DVT prophylaxis: Heparin  drip    Antimicrobials: None Fluid:  Currently on NS at 75 mL per hour. Consultants: Vascular surgery Family Communication: None at bedside  Status: Inpatient Level of care:  Progressive   Patient is from: CIR Needs to continue in-hospital care: Remains on IV heparin  drip, pending wound closure in OR tomorrow Anticipated d/c to: Pending clinical course, PT eval      Diet:  Diet Order             Diet NPO time specified Except for: Sips with Meds, Ice Chips  Diet effective midnight                   Scheduled Meds:  [MAR Hold] aspirin  EC  81 mg Oral Daily   [MAR Hold] citalopram   20 mg Oral Daily   [MAR Hold] docusate sodium   100 mg Oral BID   [MAR Hold] DULoxetine   30 mg Oral QHS   [MAR Hold] oxyCODONE   10 mg Oral Q12H   [MAR Hold] pregabalin   150 mg Oral TID   [MAR Hold] rosuvastatin   20 mg Oral Daily   [MAR Hold] tamsulosin   0.4 mg Oral QPC supper    PRN meds: [MAR Hold] acetaminophen , [MAR Hold] albuterol , [MAR Hold]  morphine  injection, [MAR Hold] naLOXone  (NARCAN )  injection, [MAR Hold] ondansetron  **OR** [MAR Hold] ondansetron  (ZOFRAN ) IV, [MAR Hold] oxyCODONE , [MAR Hold] potassium chloride , [MAR Hold] senna-docusate, [MAR Hold] zolpidem    Infusions:   sodium chloride  75 mL/hr at 09/27/24 [phone]  heparin  500 Units/hr (09/26/24 2311)   lactated ringers  10 mL/hr at 09/27/24 1029    Antimicrobials: Anti-infectives (From admission, onward)    Start     Dose/Rate Route Frequency Ordered Stop   09/24/24 2115  vancomycin  (VANCOCIN ) IVPB 1000 mg/200 mL premix        1,000 mg 200 mL/hr over 60 Minutes Intravenous Every 12 hours 09/24/24 2027 09/25/24 1030       Objective: Vitals:   09/27/24 0800 09/27/24 1017  BP: (!) 118/56 98/62  Pulse: 89 87  Resp: 16   Temp: 98.2 F (36.8 C) 98.7 F (37.1 C)  SpO2: 96%     Intake/Output Summary (Last 24 hours) at 09/27/2024 1055 Last data filed at 09/27/2024 0836 Gross per 24 hour  Intake 480 ml  Output 2685 ml  Net -2205 ml   There were no  vitals filed for this visit. Weight change:  Body mass index is 27.09 kg/m.   Physical Exam: General exam: Pleasant, elderly Caucasian male.  Not in pain Skin: No rashes, lesions or ulcers.  Has bruises in multiple places HEENT: Atraumatic, normocephalic, no obvious bleeding Lungs: Clear to auscultation bilaterally,  CVS: S1, S2, no murmur,   GI/Abd: Soft, nontender, nondistended, bowel sound present,   CNS: Alert, awake, oriented x 3 Psychiatry: Sad affect Extremities: Right lower extremity on Ace wrap.  Exam of right lower extremity per vascular surgery   Data Review: I have personally reviewed the laboratory data and studies available.  F/u labs ordered Unresulted Labs (From admission, onward)     Start     Ordered   09/26/24 0500  Heparin  level (unfractionated)  Daily,   R      09/25/24 0741   09/25/24 0500  CBC with Differential/Platelet  Daily,   R     Question:  Specimen collection method  Answer:  IV Team=IV Team collect   09/24/24 1556   09/25/24 0500  Comprehensive metabolic panel with GFR  Daily,   R     Question:  Specimen collection method  Answer:  IV Team=IV Team collect   09/24/24 1556   09/25/24 0500  Magnesium   Daily,   R     Question:  Specimen collection method  Answer:  IV Team=IV Team collect   09/24/24 1556   09/25/24 0500  Phosphorus  Daily,   R     Question:  Specimen collection method  Answer:  IV Team=IV Team collect   09/24/24 1556            Signed, Chapman Rota, MD [facility] 09/27/2024

## 2024-09-27 NOTE — Progress Notes (Signed)
 Inpatient Rehabilitation Care Coordinator Discharge Note   Patient Details  Name: David King MRN: [id number] Date of Birth: [birth year]   Discharge location: Going to acute for medical issues  Length of Stay: 9 days  Discharge activity level: mod/min  Home/community participation: active  Patient response un:Yzjouy Literacy - How often do you need to have someone help you when you read instructions, pamphlets, or other written material from your doctor or pharmacy?: Never  Patient response un:Dnrpjo Isolation - How often do you feel lonely or isolated from those around you?: Patient unable to respond  Services provided included: MD, RD, PT, OT, RN, CM, Pharmacy, SW, Neuropsych  Financial Services:  Field seismologist Utilized: Private Insurance [facility] Medicare  Choices offered to/list presented to: NA  Follow-up services arranged:  Other (Comment) (transferring to acute)           Patient response to transportation need: Is the patient able to respond to transportation needs?: Yes In the past 12 months, has lack of transportation kept you from medical appointments or from getting medications?: No In the past 12 months, has lack of transportation kept you from meetings, work, or from getting things needed for daily living?: No   Patient/Family verbalized understanding of follow-up arrangements:  Yes  Individual responsible for coordination of the follow-up plan: self  Confirmed correct DME delivered: Raymonde Asberry MATSU 09/27/2024    Comments (or additional information): going to acute for medical procedure and will need to come back to rehab to complete his goals and rehab  Summary of Stay    Date/Time Discharge Planning CSW  09/21/24 0847 Home with wife who can assist but does have knee issues. Wife is here daily to provide support. Await team's recommendations for DME and follow up. RGD       Mikena Masoner G

## 2024-09-27 NOTE — Progress Notes (Signed)
 PT Cancellation Note  Patient Details Name: David King MRN: [id number] DOB: Dec 19, 1956   Cancelled Treatment:    Reason Eval/Treat Not Completed: Patient at procedure or test/unavailable  Off unit for surgery. Will continue to follow during admission.  Leontine Roads, PT, DPT [facility] Services Physical Therapist Office: [phone] Website: [email or web address]   Leontine GORMAN Roads 09/27/2024, 11:03 AM

## 2024-09-27 NOTE — Anesthesia Postprocedure Evaluation (Signed)
 Anesthesia Post Note  Patient: David King  Procedure(s) Performed: CREATION, BYPASS, ARTERIAL, AXILLARY TO BILATERAL FEMORAL, USING PROPATEN X 80CM X 2 GRAFT (Right: Axilla) BILATERAL FEMORAL ENDARTERECTOMY (Bilateral: Groin) BILATERAL FEMORAL ARTERY THROMBECTOMY (Bilateral: Groin) RIGHT LOWER LEG FOUR COMPARTMENT FASCIOTOMY WITH RESECTION OF TIBIALIS ANTERIOR (Right: Leg Lower)     Patient location during evaluation: PACU Anesthesia Type: General Level of consciousness: awake and alert and confused Pain management: pain level controlled Vital Signs Assessment: post-procedure vital signs reviewed and stable Respiratory status: spontaneous breathing, nonlabored ventilation, respiratory function stable and patient connected to nasal cannula oxygen  Cardiovascular status: blood pressure returned to baseline and stable Postop Assessment: no apparent nausea or vomiting Anesthetic complications: no Comments: Pt with no distal pulses in PACU. To return to OR.   There were no known notable events for this encounter.  Last Vitals:  Vitals:   09/27/24 1900 09/27/24 2000  BP: (!) 110/58 104/64  Pulse:    Resp: 18   Temp: 36.4 C   SpO2: 93%     Last Pain:  Vitals:   09/27/24 1900  TempSrc: Axillary  PainSc:                  [name]

## 2024-09-27 NOTE — Anesthesia Procedure Notes (Signed)
 Procedure Name: LMA Insertion Date/Time: 09/27/2024 11:44 AM  Performed by: Myrna Homer, CRNAPre-anesthesia Checklist: Patient identified, Emergency Drugs available, Suction available and Patient being monitored Patient Re-evaluated:Patient Re-evaluated prior to induction Oxygen  Delivery Method: Circle System Utilized Preoxygenation: Pre-oxygenation with 100% oxygen  Induction Type: IV induction Ventilation: Mask ventilation without difficulty LMA: LMA inserted LMA Size: 5.0 Number of attempts: 1 Airway Equipment and Method: Bite block Placement Confirmation: positive ETCO2 Tube secured with: Tape Dental Injury: Teeth and Oropharynx as per pre-operative assessment

## 2024-09-28 ENCOUNTER — Encounter (HOSPITAL_COMMUNITY): Payer: Self-pay | Admitting: Vascular Surgery

## 2024-09-28 DIAGNOSIS — I739 Peripheral vascular disease, unspecified: Secondary | ICD-10-CM | POA: Diagnosis not present

## 2024-09-28 LAB — CBC WITH DIFFERENTIAL/PLATELET
Abs Immature Granulocytes: 0.07 K/uL (ref 0.00–0.07)
Basophils Absolute: 0 K/uL (ref 0.0–0.1)
Basophils Relative: 0 %
Eosinophils Absolute: 0 K/uL (ref 0.0–0.5)
Eosinophils Relative: 0 %
HCT: 22.5 % — ABNORMAL LOW (ref 39.0–52.0)
Hemoglobin: 7.3 g/dL — ABNORMAL LOW (ref 13.0–17.0)
Immature Granulocytes: 1 %
Lymphocytes Relative: 11 %
Lymphs Abs: 0.6 K/uL — ABNORMAL LOW (ref 0.7–4.0)
MCH: 31.1 pg (ref 26.0–34.0)
MCHC: 32.4 g/dL (ref 30.0–36.0)
MCV: 95.7 fL (ref 80.0–100.0)
Monocytes Absolute: 0.4 K/uL (ref 0.1–1.0)
Monocytes Relative: 8 %
Neutro Abs: 4.2 K/uL (ref 1.7–7.7)
Neutrophils Relative %: 80 %
Platelets: 126 K/uL — ABNORMAL LOW (ref 150–400)
RBC: 2.35 MIL/uL — ABNORMAL LOW (ref 4.22–5.81)
RDW: 15.2 % (ref 11.5–15.5)
WBC: 5.3 K/uL (ref 4.0–10.5)
nRBC: 0 % (ref 0.0–0.2)

## 2024-09-28 LAB — URINALYSIS, ROUTINE W REFLEX MICROSCOPIC
Bilirubin Urine: NEGATIVE
Glucose, UA: NEGATIVE mg/dL
Ketones, ur: NEGATIVE mg/dL
Nitrite: NEGATIVE
Protein, ur: 100 mg/dL — AB
Specific Gravity, Urine: 1.008 (ref 1.005–1.030)
WBC, UA: >50 WBC/hpf (ref 0–5)
pH: 6 (ref 5.0–8.0)

## 2024-09-28 LAB — COMPREHENSIVE METABOLIC PANEL WITH GFR
ALT: 56 U/L — ABNORMAL HIGH (ref 0–44)
AST: 51 U/L — ABNORMAL HIGH (ref 15–41)
Albumin: 2.2 g/dL — ABNORMAL LOW (ref 3.5–5.0)
Alkaline Phosphatase: 83 U/L (ref 38–126)
Anion gap: 10 (ref 5–15)
BUN: 15 mg/dL (ref 8–23)
CO2: 23 mmol/L (ref 22–32)
Calcium: 7.6 mg/dL — ABNORMAL LOW (ref 8.9–10.3)
Chloride: 98 mmol/L (ref 98–111)
Creatinine, Ser: 1.04 mg/dL (ref 0.61–1.24)
GFR, Estimated: >60 mL/min (ref >60)
Glucose, Bld: 114 mg/dL — ABNORMAL HIGH (ref 70–99)
Potassium: 4.4 mmol/L (ref 3.5–5.1)
Sodium: 131 mmol/L — ABNORMAL LOW (ref 135–145)
Total Bilirubin: 1 mg/dL (ref 0.0–1.2)
Total Protein: 5.1 g/dL — ABNORMAL LOW (ref 6.5–8.1)

## 2024-09-28 LAB — MAGNESIUM: Magnesium: 2.1 mg/dL (ref 1.7–2.4)

## 2024-09-28 LAB — HEPARIN LEVEL (UNFRACTIONATED)
Heparin Unfractionated: 0.14 [IU]/mL — ABNORMAL LOW (ref 0.30–0.70)
Heparin Unfractionated: 0.22 [IU]/mL — ABNORMAL LOW (ref 0.30–0.70)

## 2024-09-28 LAB — PHOSPHORUS: Phosphorus: 3.6 mg/dL (ref 2.5–4.6)

## 2024-09-28 LAB — PREPARE RBC (CROSSMATCH)

## 2024-09-28 MED ORDER — SODIUM CHLORIDE 0.9% IV SOLUTION: Freq: Once | INTRAVENOUS | Status: AC

## 2024-09-28 NOTE — Progress Notes (Signed)
 I had a nice conversation with Dr. Harden this morning regarding David King's care.   We discussed that the anterior compartment has been resected, but the neurovascular bundle remains intact with flow in the wound bed.  We discussed that management would likely require several trips to the operating room with granulation tissue support from biological product.   Dr. Duda offered to take David King to the operating room tomorrow to wash of the wound bed and provide insight into wound care moving forward as he he has more experience with complex wound closure.   I discussed the above with David King.  He is on board.  He is aware that this will take multiple OR trips and will heal slowly.   Please make n.p.o. for or tomorrow.  I discussed the above with his wife.   David King Rim MD

## 2024-09-28 NOTE — Op Note (Deleted)
 I had a nice conversation with Dr. Harden this morning regarding David King's care.   We discussed that the anterior compartment has been resected, but the neurovascular bundle remains intact with flow in the wound bed.  We discussed that management would likely require several trips to the operating room with granulation tissue support from biological product.   Dr. Duda offered to take David King to the operating room tomorrow to wash of the wound bed and provide insight into wound care moving forward as he he has more experience with complex wound closure.   I discussed the above with David King.  He is on board.  He is aware that this will take multiple OR trips and will heal slowly.   Please make n.p.o. for or tomorrow.  I discussed the above with his wife.   David FORBES Rim MD

## 2024-09-28 NOTE — Progress Notes (Signed)
 Progress Note    09/28/2024 8:26 AM 1 Day Post-Op  Subjective: No complaints    Vitals:   09/28/24 0400 09/28/24 0808  BP: 118/65 (!) 93/57  Pulse:    Resp: 13 16  Temp:    SpO2: 96% 100%    Physical Exam: General: Laying comfortably in bed, alert and oriented x 3 Cardiac: Regular Lungs: Nonlabored Incisions: Right axillary and bilateral groin incisions intact and dry.  Right medial calf incision intact with staples.  Right lateral leg wound with wound VAC with good seal Extremities: Right foot is warm to touch with unchanged lack of sensation and motor.  Brisk right PT Doppler signal   CBC    Component Value Date/Time   WBC 5.3 09/28/2024 0648   RBC 2.35 (L) 09/28/2024 0648   HGB 7.3 (L) 09/28/2024 0648   HCT 22.5 (L) 09/28/2024 0648   PLT 126 (L) 09/28/2024 0648   MCV 95.7 09/28/2024 0648   MCH 31.1 09/28/2024 0648   MCHC 32.4 09/28/2024 0648   RDW 15.2 09/28/2024 0648   LYMPHSABS 0.6 (L) 09/28/2024 0648   MONOABS 0.4 09/28/2024 0648   EOSABS 0.0 09/28/2024 0648   BASOSABS 0.0 09/28/2024 0648    BMET    Component Value Date/Time   NA 131 (L) 09/28/2024 0648   K 4.4 09/28/2024 0648   CL 98 09/28/2024 0648   CO2 23 09/28/2024 0648   GLUCOSE 114 (H) 09/28/2024 0648   BUN 15 09/28/2024 0648   CREATININE 1.04 09/28/2024 0648   CREATININE 1.06 10/10/2020 0915   CALCIUM  7.6 (L) 09/28/2024 0648   GFRNONAA >60 09/28/2024 0648   GFRNONAA 73 10/10/2020 0915   GFRAA 84 10/10/2020 0915    INR    Component Value Date/Time   INR 1.2 08/22/2022 0833     Intake/Output Summary (Last 24 hours) at 09/28/2024 0826 Last data filed at 09/28/2024 0529 Gross per 24 hour  Intake 1611.69 ml  Output 1105 ml  Net 506.69 ml      Assessment/Plan:  68 y.o. male is 4 days postop, s/p: #1: Right axillary bifemoral bypass graft                       #2: Bilateral common femoral, superficial femoral, and profundofemoral thrombectomy                       #3: Redo left  common femoral artery exposure                       #4: 4 compartment right leg fasciotomy                       #5: Resection of necrotic muscle in the right anterior compartment                       #6: Bilateral femoral endarterectomy   With same-day takeback for right popliteal artery exposure, right popliteal thrombectomy, right tibial thrombectomy  1 day postop, s/p: Right lateral calf fasciotomy washout, debridement of skin and soft tissue, myriad morsel placement, and wound VAC placement   - He says he feels okay this morning after surgery.  He has continued soreness in his right lower leg at his incision sites -Bilateral lower extremities remain well-perfused with brisk PT Doppler signals -His right foot remains warm to touch with good color.  He has no return of  motor or sensation in the foot -Right axillary and bilateral groin incisions are healing appropriately.  Right lateral calf with wound VAC with good seal -Hemoglobin stable at 7.3, transfuse as needed -Okay to begin CIR placement process. -Will likely plan return to the OR at the end of this week for right lateral calf washout and wound VAC replacement   Ahmed Holster, PA-C [facility] [phone] 09/28/2024 8:26 AM

## 2024-09-28 NOTE — Progress Notes (Signed)
 PHARMACY - ANTICOAGULATION CONSULT NOTE  Pharmacy Consult for Heparin  Indication:  arterial insufficiency / occlusion  Allergies  Allergen Reactions   Ancef  [Cefazolin ] Anaphylaxis    Patient Measurements: Height: 5' 9 (175.3 cm) IBW/kg (Calculated) : 70.7 Heparin  dosing weight = 83 kg  Vital Signs: Temp: 97.9 F (36.6 C) (09/30 1800) Temp Source: Oral (09/30 1531) BP: 112/60 (09/30 1700) Pulse Rate: 79 (09/30 1531)  Labs: Recent Labs    09/26/24 0257 09/26/24 0852 09/27/24 0349 09/28/24 0648 09/28/24 1808  HGB 6.8* 7.8* 7.5* 7.3*  --   HCT 20.5* 23.7* 23.8* 22.5*  --   PLT 137*  --  118* 126*  --   HEPARINUNFRC <0.10*  --  <0.10* 0.14* 0.22*  CREATININE 1.28*  --  1.26* 1.04  --     Estimated Creatinine Clearance: 66.1 mL/min (by C-G formula based on SCr of 1.04 mg/dL).  Assessment: 26 YOM admitted w/ severe vascular insufficiency who is tentatively scheduled for right axillary bifemoral bypass w/ possible right common femoral to popliteal bypass. No PTA AC noted through a search of his EMR. Of note, he had recent lumbar fusion on 09/08/2024. Consult for heparin  therapy received 09/22/24. Patient subsequently transferred from CIR to inpatient on 9/25. Heparin  decreased to 500 units/hr per [facility].   Patient now post op from R calf lateral fasciotomy washout, debridement of skin and soft tissue. Therapeutic heparin  resumed postop in the evening.  Heparin  level sub-therapeutic at 0.22 units/mL but trending up.  Lab drawn appropriately and no issue with heparin  infusion.  No bleeding reported.  Goal of Therapy:  Heparin  level 0.3-0.5 units/ml Monitor platelets by anticoagulation protocol: Yes   Plan:  Increase heparin  infusion to 1900 units/hr Recheck heparin  level with AM labs  Jerelyn Trimarco D. Lendell, PharmD, BCPS, BCCCP 09/28/2024, 7:40 PM

## 2024-09-28 NOTE — Progress Notes (Signed)
 PROGRESS NOTE  David King  DOB: 01-Apr-1956  PCP: [name], MD FMW:[id number]  DOA: 09/24/2024  LOS: 4 days  Hospital Day: 5  Brief narrative: David King is a 68 y.o. male with PMH significant for HTN, HLD, PAD s/p b/l common [place] artery stenting, prostate cancer, COPD, diverticulosis, chronic back pain, lumbar radiculopathy, left eye with times detachment. Because of progressive radiculopathy pain, patient was following up with orthopedics as an outpatient.  He had previous L4-L5 disc decompression.  Recent MRI showed severe stenosis of the left L4-L5 due to large disc herniation.  -- 09/08/2024, patient underwent revision L4-L5 decompression, lumbar body fusion by Dr. Beuford.  During the procedure, patient had an anaphylactic reaction and developed hives with some hypotension. He required short-term intubation and transferred to ICU, post recovery he was noted to have some improvement in his left leg pain, postop site looked okay but he developed some right lower extremity weakness.  Neurology was consulted.  MRI brain was unremarkable.  EMG showed nonspecific changes.  -- 9/17, discharged to CIR. 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 -- 9/26, patient was readmitted to acute care with a plan of revascularization.  Patient underwent right axillary femoral bypass graft, bilateral common femoral, superficial femoral and profundofemoral thrombectomy, right leg anterior compartment fasciotomy due to compartment syndrome, right femoral endarterectomy. Soon after coming out of the OR, he was found to have decrease in his right lower extremity pulses again. Seen by vascular surgery, taken to the OR again.  Underwent right leg angiogram, found to have thrombus in the popliteal  artery, underwent successful thrombectomy. Postop, admitted to [facility] -- 9/29, underwent right calf lateral fasciotomy washout, skin debridement and wound VAC placement.  Subjective: Patient was seen and examined this morning. Lying down in bed.  Not in distress. Underwent fasciotomy yesterday Remains afebrile, hemodynamically stable, breathing room air Labs from this morning with sodium 131, renal function improved to normal, hemoglobin at 7.3 Per RN, this morning, patient voided 275 ml. bladder scan showed 394 ml and pt said he could void again. Voided another 250 ml.  Urine looks cloudy and smells foul per RN.  Assessment and plan: Acute/subacute multifocal thrombosis s/p thrombectomy Compartment syndrome s/p right leg anterior compartment fasciotomy H/o PAD Timeline of events and stepwise surgeries as above. Most recent surgical intervention yesterday right calf lateral fasciotomy without any skin debridement and wound VAC placement Currently on heparin  drip, aspirin  81 mg daily, statin Vascular surgery following Per vascular note, bilateral lower extremities are well-perfused with brisk PT Doppler signals..  But right foot with no motor or sensory function.   Noted a plan from vascular surgery that Dr. Harden will take him to the OR again tomorrow for further care.  Lumbar radiculopathy S/p recent L4-L5 decompression/fusion -9/10 Dr Beuford PT/OT eval Pain regimen --- Scheduled: OxyContin  10 mg twice daily, Lyrica  150 mg 3 times daily --- PRN: Morphine  2 mg every 4 hours, oxycodone  10 mg every 4 hours  Acute blood loss anemia Hemoglobin running low since surgery on 9/10. Further acute drop noted as low as 6.8 on 9/28.  1 unit PRBC given. Hemoglobin stable within 7 and 8 for last 3 days.  Continue to monitor. Recent Labs    09/25/24 0500 09/26/24 0257 09/26/24 9147 09/27/24 9650 09/28/24 9351  HGB 7.5* 6.8* 7.8* 7.5* 7.3*  MCV 97.8 100.0  --  100.4* 95.7   AKI Creatinine  improved today with IV hydration.  I would hold IV fluid today Recent Labs    09/09/24 0530 09/16/24 0557 09/17/24 0507 09/18/24 1233 09/19/24 1312 09/22/24 0551 09/25/24 0500 09/26/24 0257 09/27/24 0349 09/28/24 0648  BUN 10 26* 20 19 18 14 10 11 12 15   CREATININE 0.98 0.83 0.98 0.72 0.87 0.88 0.92 1.28* 1.26* 1.04  CO2 22 23 24  20* 25 24 23 25 23 23    Acute urinary retention Foul-smelling urine H/o prostate cancer  Patient had Foley catheter inserted on admission.  Removed 9/27, needed multiple In-N-Out catheterization  9/30, patient voided 275 ml. bladder scan showed 394 ml and pt said he could void again. Voided another 250 ml.  Urine looks cloudy and smells foul per RN. Obtain urinalysis.  I would not put in a Foley catheter at this time.  Patient probably needs reminders to urinate. Continue Flomax  as before.  Hyponatremia Mild.  Continue to monitor. Recent Labs  Lab 09/22/24 0551 09/24/24 1311 09/24/24 1415 09/25/24 0500 09/26/24 0257 09/27/24 0349 09/28/24 0648  NA 131* 130* 130* 131* 133* 131* 131*   Hyperlipidemia Crestor  20 mg daily   COPD Bronchodilators as needed  Diverticulosis Encourage regular bowel regimen Currently on Colace 100 mg twice daily.  Anxiety/depression Celexa  20 mg daily, Cymbalta  nightly, Ambien  nightly as needed  Mobility:  PT Orders: Active   PT Follow up Rec: Acute Inpatient Rehab (3hours/Day)09/25/2024 1652   Goals of care   Code Status: Full Code     DVT prophylaxis: Heparin  drip    Antimicrobials: None Fluid: Hold IV fluid today Consultants: Vascular surgery Family Communication: None at bedside  Status: Inpatient Level of care:  Progressive   Patient is from: CIR Needs to continue in-hospital care: Remains on IV heparin  drip, pending further trip to OR Anticipated d/c to: CIR recommended    Diet:  Diet Order             Diet regular Fluid consistency: Thin  Diet effective now                    Scheduled Meds:  aspirin  EC  81 mg Oral Daily   citalopram   20 mg Oral Daily   docusate sodium   100 mg Oral BID   DULoxetine   30 mg Oral QHS   oxyCODONE   10 mg Oral Q12H   pregabalin   150 mg Oral TID   rosuvastatin   20 mg Oral Daily   tamsulosin   0.4 mg Oral QPC supper    PRN meds: acetaminophen , albuterol , morphine  injection, naLOXone  (NARCAN )  injection, ondansetron  **OR** ondansetron  (ZOFRAN ) IV, oxyCODONE , potassium chloride , senna-docusate, zolpidem    Infusions:   heparin  1,750 Units/hr (09/28/24 1030)    Antimicrobials: Anti-infectives (From admission, onward)    Start     Dose/Rate Route Frequency Ordered Stop   09/24/24 2115  vancomycin  (VANCOCIN ) IVPB 1000 mg/200 mL premix        1,000 mg 200 mL/hr over 60 Minutes Intravenous Every 12 hours 09/24/24 2027 09/25/24 1030       Objective: Vitals:   09/28/24 0808 09/28/24 0900  BP: (!) 93/57 104/64  Pulse:    Resp: 16 12  Temp: 97.6 F (36.4 C)   SpO2: 100% 98%    Intake/Output Summary (Last 24 hours) at 09/28/2024 1301 Last data filed at 09/28/2024 1234 Gross per 24 hour  Intake 611.69 ml  Output 1705 ml  Net -1093.31 ml   There were no vitals filed for this visit. Weight change:  Body mass index is 27.09 kg/m.   Physical Exam: General exam: Pleasant, elderly Caucasian male.  Not in pain Skin: No rashes, lesions or ulcers.  Has bruises in multiple places HEENT: Atraumatic, normocephalic, no obvious bleeding Lungs: Clear to auscultation bilaterally,  CVS: S1, S2, no murmur,   GI/Abd: Soft, nontender, nondistended, bowel sound present,   CNS: Alert, awake, oriented x 3 Psychiatry: Sad affect Extremities: Right lower extremity has wound VAC on.    Data Review: I have personally reviewed the laboratory data and studies available.  F/u labs ordered Unresulted Labs (From admission, onward)     Start     Ordered   09/29/24 0500  Heparin  level (unfractionated)  Daily,   R     Question:  Specimen  collection method  Answer:  Lab=Lab collect   09/27/24 1431   09/28/24 1800  Heparin  level (unfractionated)  Once-Timed,   TIMED       Question:  Specimen collection method  Answer:  Lab=Lab collect   09/28/24 1047   09/28/24 1233  Urinalysis, Routine w reflex microscopic -Urine, Random  Once,   R       Question:  Specimen Source  Answer:  Urine, Random   09/28/24 1233   09/25/24 0500  CBC with Differential/Platelet  Daily,   R     Question:  Specimen collection method  Answer:  IV Team=IV Team collect   09/24/24 1556   09/25/24 0500  Comprehensive metabolic panel with GFR  Daily,   R     Question:  Specimen collection method  Answer:  IV Team=IV Team collect   09/24/24 1556            Signed, Chapman Rota, MD [facility] 09/28/2024

## 2024-09-28 NOTE — Plan of Care (Signed)
  Problem: Education: Goal: Knowledge of General Education information will improve Description: Including pain rating scale, medication(s)/side effects and non-pharmacologic comfort measures Outcome: Progressing   Problem: Health Behavior/Discharge Planning: Goal: Ability to manage health-related needs will improve Outcome: Progressing   Problem: Clinical Measurements: Goal: Ability to maintain clinical measurements within normal limits will improve Outcome: Progressing Goal: Cardiovascular complication will be avoided Outcome: Progressing   Problem: Skin Integrity: Goal: Risk for impaired skin integrity will decrease Outcome: Progressing   Problem: Education: Goal: Knowledge of prescribed regimen will improve Outcome: Progressing   Problem: Activity: Goal: Ability to tolerate increased activity will improve Outcome: Progressing

## 2024-09-28 NOTE — Progress Notes (Signed)
 PHARMACY - ANTICOAGULATION CONSULT NOTE  Pharmacy Consult for Heparin  Indication:  arterial insufficiency / occlusion  Allergies  Allergen Reactions   Ancef  [Cefazolin ] Anaphylaxis    Patient Measurements: Height: 5' 9 (175.3 cm) IBW/kg (Calculated) : 70.7  Vital Signs: Temp: 97.6 F (36.4 C) (09/30 0300) Temp Source: Oral (09/30 0300) BP: 118/65 (09/30 0400)  Labs: Recent Labs    09/26/24 0257 09/26/24 0852 09/27/24 0349 09/28/24 0648  HGB 6.8* 7.8* 7.5* 7.3*  HCT 20.5* 23.7* 23.8* 22.5*  PLT 137*  --  118* 126*  HEPARINUNFRC <0.10*  --  <0.10* 0.14*  CREATININE 1.28*  --  1.26* 1.04    Estimated Creatinine Clearance: 66.1 mL/min (by C-G formula based on SCr of 1.04 mg/dL).  Assessment: 77 YOM admitted w/ severe vascular insufficiency who is tentatively scheduled for right axillary bifemoral bypass w/ possible right common femoral to popliteal bypass. No PTA AC noted through a search of his EMR. Of note, he had recent lumbar fusion on 09/08/2024. Consult for heparin  therapy received 09/22/24. Patient subsequently transferred from CIR to inpatient on 9/25. Heparin  decreased to 500 units/hr per [facility].   Patient now post op from R calf lateral fasciotomy washout, debridement of skin and soft tissue. Therapeutic heparin  resumed postop in the evening.  Heparin  level this am is subtherapeutic at 0.14. CBC stable, no bleeding concerns per RN.   Goal of Therapy:  Heparin  level 0.3-0.5 units/ml Monitor platelets by anticoagulation protocol: Yes   Plan:  Increase heparin  to 1750 units/h Recheck heparin  level in 8h  Ozell Jamaica, PharmD, [place], [facility] Clinical Pharmacist [phone] Please check AMION for all [facility] numbers 09/28/2024

## 2024-09-28 NOTE — Progress Notes (Signed)
 Physical Therapy Treatment Patient Details Name: David King MRN: [id number] DOB: 10-Nov-1956 Today's Date: 09/28/2024   History of Present Illness 68 y.o. male [facility] hospital on 09/24/2024 as a transfer from inpatient rehab due to aortic occlusion at level of renal arteries and bilateral popliteal artery occlusion. Pt went to OR x2 on 9/26 initially for R axial bypass, bilateral femoral thrombectomy and RLE fasciotomy and later for redo RLE thrombectomy. Pt recently underwent L4-5 decompression and fusion on 9/10 with anaphylactic reaction during surgery requiring ICU stay. PMHx: COPD, CAD, PAD, tobacco use, lumbar radiculopathy, detached retina L eye    PT Comments  Pt received in supine after premedication by RN, pt c/o fatigue from poor sleep and with blood transfusing but pt agreeable to attempt seated transfer and sit<>stand transfers with discussion on benefits of mobility. Pt with good recall of back precs and needing most assist with sit<>stand transfer from lower bed height to/from RW with up to modA. Attempted to have pt work on standing weight shifting but after 1 step, pt c/o fatigue and needing to sit. Orthostatic BP readings taken, see below. SpO2/HR WFL on RA. Patient will benefit from intensive inpatient follow-up therapy, >3 hours/day, note plan for pt return to OR likely tomorrow.  Addendum 10/3: forgot to paste in orthostatic readings taken 9/30: Vital Signs  Patient Position (if appropriate) Orthostatic Vitals  Orthostatic Lying   BP- Lying 112/56  Pulse- Lying 76  Orthostatic Sitting  BP- Sitting 110/73  Pulse- Sitting 88  Orthostatic Standing at 0 minutes  BP- Standing at 0 minutes 97/68  Pulse- Standing at 0 minutes  (did not record, assisting patient)     If plan is discharge home, recommend the following: A lot of help with walking and/or transfers;A lot of help with bathing/dressing/bathroom;Assistance with cooking/housework;Assist for transportation;Help  with stairs or ramp for entrance   Can travel by private vehicle        Equipment Recommendations  Wheelchair (measurements PT);Wheelchair cushion (measurements PT);BSC/3in1    Recommendations for Other Services       Precautions / Restrictions Precautions Precautions: Fall;Back;Other (comment) Precaution Booklet Issued: Yes (comment) Recall of Precautions/Restrictions: Intact Precaution/Restrictions Comments: watch BP (orthostatic); RLE foot drop Required Braces or Orthoses: Spinal Brace Spinal Brace: Thoracolumbosacral orthotic;Applied in sitting position Restrictions Weight Bearing Restrictions Per Provider Order: No     Mobility  Bed Mobility Overal bed mobility: Needs Assistance Bed Mobility: Rolling, Sidelying to Sit, Sit to Sidelying Rolling: Supervision, Used rails Sidelying to sit: Contact guard assist, Used rails     Sit to sidelying: Contact guard assist, +2 for safety/equipment, Used rails General bed mobility comments: cueing for log roll technique    Transfers Overall transfer level: Needs assistance Equipment used: Rolling walker (2 wheels) Transfers: Sit to/from Stand Sit to Stand: Mod assist, +2 safety/equipment           General transfer comment: air bed EOB<>RW, increased time to perform and transition from flexed trunk posture to full upright; able to stand with PTA supporting LUE for orthostatic BP assessment but unable to perform gait due to pt fatigue increased after standing and reluctance with blood transfusing.    Ambulation/Gait               General Gait Details: pt defer due to transfusion running and fatigue after standing BP taken   Stairs             Wheelchair Mobility     Tilt Bed  Modified Rankin (Stroke Patients Only)       Balance Overall balance assessment: Needs assistance Sitting-balance support: Feet supported, Single extremity supported Sitting balance-Leahy Scale: Fair Sitting balance -  Comments: L rail   Standing balance support: Bilateral upper extremity supported, Reliant on assistive device for balance Standing balance-Leahy Scale: Poor Standing balance comment: reliant on RW, standing briefly with only RUE on RW and light LUE assist during BP reading to keep arm elevated                            Communication Communication Communication: No apparent difficulties  Cognition Arousal: Alert Behavior During Therapy: WFL for tasks assessed/performed   PT - Cognitive impairments: No apparent impairments                         Following commands: Intact Following commands impaired: Follows one step commands with increased time, Follows multi-step commands inconsistently    Cueing Cueing Techniques: Verbal cues  Exercises Other Exercises Other Exercises: seated LAQ x 5 reps    General Comments General comments (skin integrity, edema, etc.): Dizziness increased while standing      Pertinent Vitals/Pain Pain Assessment Pain Assessment: Faces Faces Pain Scale: Hurts little more Pain Location: groin incision and RLE Pain Descriptors / Indicators: Discomfort Pain Intervention(s): Monitored during session, Repositioned, Premedicated before session    Home Living                          Prior Function            PT Goals (current goals can now be found in the care plan section) Acute Rehab PT Goals Patient Stated Goal: to return to independence and reduce pain PT Goal Formulation: With patient Time For Goal Achievement: 10/09/24 Progress towards PT goals: Progressing toward goals    Frequency    Min 3X/week      PT Plan      Co-evaluation              AM-PAC PT 6 Clicks Mobility   Outcome Measure  Help needed turning from your back to your side while in a flat bed without using bedrails?: A Little Help needed moving from lying on your back to sitting on the side of a flat bed without using bedrails?:  A Little Help needed moving to and from a bed to a chair (including a wheelchair)?: A Lot Help needed standing up from a chair using your arms (e.g., wheelchair or bedside chair)?: A Lot Help needed to walk in hospital room?: Total Help needed climbing 3-5 steps with a railing? : Total 6 Click Score: 12    End of Session Equipment Utilized During Treatment: Back brace Activity Tolerance: Patient tolerated treatment well;Patient limited by fatigue Patient left: in bed;with call bell/phone within reach;with family/visitor present;Other (comment) (x4 rails up per air bed; prevalon boots donned bil) Nurse Communication: Mobility status PT Visit Diagnosis: Other abnormalities of gait and mobility (R26.89);Muscle weakness (generalized) (M62.81);Other symptoms and signs involving the nervous system (R29.898)     Time: 8441-8377 PT Time Calculation (min) (ACUTE ONLY): 24 min  Charges:    $Therapeutic Activity: 23-37 mins PT General Charges $$ ACUTE PT VISIT: 1 Visit                     Tenicia Gural P., PTA Acute Rehabilitation Services Secure Chat  Preferred 9a-5:30pm Office: [phone]    Connell CHRISTELLA Blue 09/28/2024, 5:33 PM

## 2024-09-28 NOTE — Anesthesia Postprocedure Evaluation (Signed)
 Anesthesia Post Note  Patient: SHIRL LUDINGTON  Procedure(s) Performed: RIGHT LOWER EXTREMITY ANGIOGRAM AND RIGHT POPLETEAL THROMBECTOMY (Right: Leg Lower)     Patient location during evaluation: PACU Anesthesia Type: General Level of consciousness: awake and alert Pain management: pain level controlled Vital Signs Assessment: post-procedure vital signs reviewed and stable Respiratory status: spontaneous breathing, nonlabored ventilation, respiratory function stable and patient connected to nasal cannula oxygen  Cardiovascular status: blood pressure returned to baseline and stable Postop Assessment: no apparent nausea or vomiting Anesthetic complications: no   There were no known notable events for this encounter. Kallin Henk D Vicki Pasqual

## 2024-09-28 NOTE — Progress Notes (Signed)
 EOS No notable events overnight Pain controlled with PRN oxy Severe urinary incontinence overnight Still extensively bruised, no signs of bleeding

## 2024-09-29 ENCOUNTER — Inpatient Hospital Stay (HOSPITAL_COMMUNITY)

## 2024-09-29 ENCOUNTER — Encounter (HOSPITAL_COMMUNITY)
Admission: RE | Disposition: A | Discharge status: Rehabilitation Facility | Payer: Self-pay | Source: Other Acute Inpatient Hospital | Attending: Internal Medicine

## 2024-09-29 ENCOUNTER — Encounter (HOSPITAL_COMMUNITY): Payer: Self-pay | Admitting: Internal Medicine

## 2024-09-29 ENCOUNTER — Other Ambulatory Visit: Payer: Self-pay

## 2024-09-29 DIAGNOSIS — Z87891 Personal history of nicotine dependence: Secondary | ICD-10-CM | POA: Diagnosis not present

## 2024-09-29 DIAGNOSIS — T79A21A Traumatic compartment syndrome of right lower extremity, initial encounter: Secondary | ICD-10-CM

## 2024-09-29 DIAGNOSIS — Z48812 Encounter for surgical aftercare following surgery on the circulatory system: Secondary | ICD-10-CM

## 2024-09-29 DIAGNOSIS — I10 Essential (primary) hypertension: Secondary | ICD-10-CM

## 2024-09-29 DIAGNOSIS — Z9889 Other specified postprocedural states: Secondary | ICD-10-CM

## 2024-09-29 DIAGNOSIS — I739 Peripheral vascular disease, unspecified: Secondary | ICD-10-CM | POA: Diagnosis not present

## 2024-09-29 DIAGNOSIS — I2510 Atherosclerotic heart disease of native coronary artery without angina pectoris: Secondary | ICD-10-CM | POA: Diagnosis not present

## 2024-09-29 DIAGNOSIS — Z95828 Presence of other vascular implants and grafts: Secondary | ICD-10-CM

## 2024-09-29 HISTORY — PX: INCISION AND DRAINAGE OF DEEP ABSCESS, CALF: SHX7361

## 2024-09-29 LAB — COMPREHENSIVE METABOLIC PANEL WITH GFR
ALT: 70 U/L — ABNORMAL HIGH (ref 0–44)
AST: 59 U/L — ABNORMAL HIGH (ref 15–41)
Albumin: 2.3 g/dL — ABNORMAL LOW (ref 3.5–5.0)
Alkaline Phosphatase: 93 U/L (ref 38–126)
Anion gap: 6 (ref 5–15)
BUN: 16 mg/dL (ref 8–23)
CO2: 25 mmol/L (ref 22–32)
Calcium: 7.8 mg/dL — ABNORMAL LOW (ref 8.9–10.3)
Chloride: 102 mmol/L (ref 98–111)
Creatinine, Ser: 1.02 mg/dL (ref 0.61–1.24)
GFR, Estimated: >60 mL/min (ref >60)
Glucose, Bld: 96 mg/dL (ref 70–99)
Potassium: 3.7 mmol/L (ref 3.5–5.1)
Sodium: 133 mmol/L — ABNORMAL LOW (ref 135–145)
Total Bilirubin: 1.1 mg/dL (ref 0.0–1.2)
Total Protein: 5.4 g/dL — ABNORMAL LOW (ref 6.5–8.1)

## 2024-09-29 LAB — HEPARIN LEVEL (UNFRACTIONATED)
Heparin Unfractionated: 0.32 [IU]/mL (ref 0.30–0.70)
Heparin Unfractionated: 0.34 [IU]/mL (ref 0.30–0.70)

## 2024-09-29 LAB — CBC WITH DIFFERENTIAL/PLATELET
Abs Immature Granulocytes: 0.23 K/uL — ABNORMAL HIGH (ref 0.00–0.07)
Basophils Absolute: 0 K/uL (ref 0.0–0.1)
Basophils Relative: 0 %
Eosinophils Absolute: 0.2 K/uL (ref 0.0–0.5)
Eosinophils Relative: 4 %
HCT: 27.9 % — ABNORMAL LOW (ref 39.0–52.0)
Hemoglobin: 9.4 g/dL — ABNORMAL LOW (ref 13.0–17.0)
Immature Granulocytes: 5 %
Lymphocytes Relative: 21 %
Lymphs Abs: 1.1 K/uL (ref 0.7–4.0)
MCH: 31.1 pg (ref 26.0–34.0)
MCHC: 33.7 g/dL (ref 30.0–36.0)
MCV: 92.4 fL (ref 80.0–100.0)
Monocytes Absolute: 0.5 K/uL (ref 0.1–1.0)
Monocytes Relative: 9 %
Neutro Abs: 3.1 K/uL (ref 1.7–7.7)
Neutrophils Relative %: 61 %
Platelets: 134 K/uL — ABNORMAL LOW (ref 150–400)
RBC: 3.02 MIL/uL — ABNORMAL LOW (ref 4.22–5.81)
RDW: 16.7 % — ABNORMAL HIGH (ref 11.5–15.5)
WBC: 5.1 K/uL (ref 4.0–10.5)
nRBC: 0 % (ref 0.0–0.2)

## 2024-09-29 LAB — TYPE AND SCREEN
ABO/RH(D): O POS
Antibody Screen: NEGATIVE
Unit division: 0
Unit division: 0
Unit division: 0

## 2024-09-29 LAB — BPAM RBC
Blood Product Expiration Date: 202510252359
Blood Product Expiration Date: 202510272359
Blood Product Expiration Date: 202510302359
ISSUE DATE / TIME: 202509280429
ISSUE DATE / TIME: 202509301505
ISSUE DATE / TIME: 202509302043
Unit Type and Rh: 5100
Unit Type and Rh: 5100
Unit Type and Rh: 5100

## 2024-09-29 SURGERY — INCISION AND DRAINAGE OF DEEP ABSCESS, CALF
Anesthesia: General | Site: Leg Lower | Laterality: Right

## 2024-09-29 MED ORDER — DEXAMETHASONE SODIUM PHOSPHATE 10 MG/ML IJ SOLN
INTRAMUSCULAR | Status: AC
  Filled 2024-09-29: qty 1

## 2024-09-29 MED ORDER — PROPOFOL 10 MG/ML IV BOLUS
INTRAVENOUS | Status: DC | PRN
  Administered 2024-09-29: 130 mg via INTRAVENOUS

## 2024-09-29 MED ORDER — LACTATED RINGERS IV SOLN: INTRAVENOUS | Status: DC | PRN

## 2024-09-29 MED ORDER — FENTANYL CITRATE (PF) 100 MCG/2ML IJ SOLN
INTRAMUSCULAR | Status: AC
  Filled 2024-09-29: qty 2

## 2024-09-29 MED ORDER — CHLORHEXIDINE GLUCONATE 4 % EX SOLN
60.0000 mL | Freq: Once | CUTANEOUS | Status: AC
  Administered 2024-09-29: 4 via TOPICAL
  Filled 2024-09-29: qty 60

## 2024-09-29 MED ORDER — PHENYLEPHRINE 80 MCG/ML (10ML) SYRINGE FOR IV PUSH (FOR BLOOD PRESSURE SUPPORT)
PREFILLED_SYRINGE | INTRAVENOUS | Status: DC | PRN
  Administered 2024-09-29: 80 ug via INTRAVENOUS
  Administered 2024-09-29: 160 ug via INTRAVENOUS

## 2024-09-29 MED ORDER — PROPOFOL 10 MG/ML IV BOLUS
INTRAVENOUS | Status: AC
  Filled 2024-09-29: qty 20

## 2024-09-29 MED ORDER — ACETAMINOPHEN 10 MG/ML IV SOLN
1000.0000 mg | Freq: Once | INTRAVENOUS | Status: DC | PRN
  Administered 2024-09-29: 1000 mg via INTRAVENOUS

## 2024-09-29 MED ORDER — VANCOMYCIN HCL IN DEXTROSE 1-5 GM/200ML-% IV SOLN
1000.0000 mg | INTRAVENOUS | Status: AC
  Administered 2024-09-29: 1000 mg via INTRAVENOUS
  Filled 2024-09-29 (×2): qty 200

## 2024-09-29 MED ORDER — CHLORHEXIDINE GLUCONATE 0.12 % MT SOLN: 15.0000 mL | Freq: Once | OROMUCOSAL | Status: AC

## 2024-09-29 MED ORDER — VASHE WOUND IRRIGATION OPTIME
TOPICAL | Status: DC | PRN
  Administered 2024-09-29: 34 [oz_av]

## 2024-09-29 MED ORDER — ONDANSETRON HCL 4 MG/2ML IJ SOLN
INTRAMUSCULAR | Status: DC | PRN
  Administered 2024-09-29: 4 mg via INTRAVENOUS

## 2024-09-29 MED ORDER — OXYCODONE HCL 5 MG/5ML PO SOLN: 5.0000 mg | Freq: Once | ORAL | Status: DC | PRN

## 2024-09-29 MED ORDER — FENTANYL CITRATE (PF) 250 MCG/5ML IJ SOLN
INTRAMUSCULAR | Status: DC | PRN
  Administered 2024-09-29 (×2): 50 ug via INTRAVENOUS
  Administered 2024-09-29 (×2): 25 ug via INTRAVENOUS

## 2024-09-29 MED ORDER — HYDROMORPHONE HCL 1 MG/ML IJ SOLN
0.2500 mg | INTRAMUSCULAR | Status: DC | PRN
  Administered 2024-09-29: 0.5 mg via INTRAVENOUS

## 2024-09-29 MED ORDER — HEPARIN (PORCINE) 25000 UT/250ML-% IV SOLN
1900.0000 [IU]/h | INTRAVENOUS | Status: DC
  Administered 2024-09-29: 1900 [IU]/h via INTRAVENOUS
  Filled 2024-09-29 (×3): qty 250

## 2024-09-29 MED ORDER — LEVOFLOXACIN IN D5W 500 MG/100ML IV SOLN
500.0000 mg | INTRAVENOUS | Status: AC
  Administered 2024-09-29: 500 mg via INTRAVENOUS
  Filled 2024-09-29: qty 100

## 2024-09-29 MED ORDER — EPHEDRINE SULFATE-NACL 50-0.9 MG/10ML-% IV SOSY
PREFILLED_SYRINGE | INTRAVENOUS | Status: DC | PRN
  Administered 2024-09-29: 5 mg via INTRAVENOUS
  Administered 2024-09-29: 10 mg via INTRAVENOUS

## 2024-09-29 MED ORDER — FENTANYL CITRATE (PF) 250 MCG/5ML IJ SOLN
INTRAMUSCULAR | Status: AC
  Filled 2024-09-29: qty 5

## 2024-09-29 MED ORDER — ONDANSETRON HCL 4 MG/2ML IJ SOLN
INTRAMUSCULAR | Status: AC
  Filled 2024-09-29: qty 2

## 2024-09-29 MED ORDER — LIDOCAINE 2% (20 MG/ML) 5 ML SYRINGE
INTRAMUSCULAR | Status: AC
  Filled 2024-09-29: qty 5

## 2024-09-29 MED ORDER — OXYCODONE HCL 5 MG PO TABS: 5.0000 mg | ORAL_TABLET | Freq: Once | ORAL | Status: DC | PRN

## 2024-09-29 MED ORDER — PHENYLEPHRINE 80 MCG/ML (10ML) SYRINGE FOR IV PUSH (FOR BLOOD PRESSURE SUPPORT)
PREFILLED_SYRINGE | INTRAVENOUS | Status: AC
Start: 2024-09-29 — End: 2024-09-29
  Filled 2024-09-29: qty 10

## 2024-09-29 MED ORDER — CHLORHEXIDINE GLUCONATE 0.12 % MT SOLN
OROMUCOSAL | Status: AC
  Administered 2024-09-29: 15 mL via OROMUCOSAL
  Filled 2024-09-29: qty 15

## 2024-09-29 MED ORDER — ONDANSETRON HCL 4 MG/2ML IJ SOLN: 4.0000 mg | Freq: Once | INTRAMUSCULAR | Status: DC | PRN

## 2024-09-29 MED ORDER — FENTANYL CITRATE (PF) 100 MCG/2ML IJ SOLN
25.0000 ug | INTRAMUSCULAR | Status: DC | PRN
  Administered 2024-09-29 (×2): 25 ug via INTRAVENOUS
  Administered 2024-09-29: 50 ug via INTRAVENOUS
  Administered 2024-09-29 (×2): 25 ug via INTRAVENOUS

## 2024-09-29 MED ORDER — LIDOCAINE 2% (20 MG/ML) 5 ML SYRINGE
INTRAMUSCULAR | Status: DC | PRN
  Administered 2024-09-29: 100 mg via INTRAVENOUS

## 2024-09-29 MED ORDER — ORAL CARE MOUTH RINSE: 15.0000 mL | Freq: Once | OROMUCOSAL | Status: AC

## 2024-09-29 MED ORDER — HYDROMORPHONE HCL 1 MG/ML IJ SOLN
INTRAMUSCULAR | Status: AC
  Filled 2024-09-29: qty 1

## 2024-09-29 MED ORDER — PHENYLEPHRINE HCL-NACL 20-0.9 MG/250ML-% IV SOLN
INTRAVENOUS | Status: DC | PRN
  Administered 2024-09-29: 30 ug/min via INTRAVENOUS

## 2024-09-29 MED ORDER — DEXAMETHASONE SODIUM PHOSPHATE 10 MG/ML IJ SOLN
INTRAMUSCULAR | Status: DC | PRN
  Administered 2024-09-29: 5 mg via INTRAVENOUS

## 2024-09-29 MED ORDER — POVIDONE-IODINE 10 % EX SWAB
2.0000 | Freq: Once | CUTANEOUS | Status: AC
  Administered 2024-09-29: 2 via TOPICAL

## 2024-09-29 MED ORDER — ACETAMINOPHEN 10 MG/ML IV SOLN
INTRAVENOUS | Status: AC
  Filled 2024-09-29: qty 100

## 2024-09-29 MED ORDER — LACTATED RINGERS IV SOLN: INTRAVENOUS | Status: DC

## 2024-09-29 SURGICAL SUPPLY — 41 items
BAG COUNTER SPONGE SURGICOUNT (BAG) IMPLANT
BLADE SURG 21 STRL SS (BLADE) ×2 IMPLANT
BNDG COHESIVE 4X5 TAN STRL LF (GAUZE/BANDAGES/DRESSINGS) IMPLANT
BNDG COHESIVE 6X5 TAN NS LF (GAUZE/BANDAGES/DRESSINGS) IMPLANT
BNDG COHESIVE 6X5 TAN ST LF (GAUZE/BANDAGES/DRESSINGS) IMPLANT
BNDG GAUZE DERMACEA FLUFF 4 (GAUZE/BANDAGES/DRESSINGS) IMPLANT
CANISTER WOUNDNEG PRESSURE 500 (CANNISTER) IMPLANT
CLEANSER WND VASHE 34 (WOUND CARE) IMPLANT
CLEANSER WND VASHE INSTL 34OZ (WOUND CARE) IMPLANT
COVER SURGICAL LIGHT HANDLE (MISCELLANEOUS) ×4 IMPLANT
DRAPE DERMATAC (DRAPES) IMPLANT
DRAPE U-SHAPE 47X51 STRL (DRAPES) ×2 IMPLANT
DRESSING PREVENA PLUS CUSTOM (GAUZE/BANDAGES/DRESSINGS) IMPLANT
DRESSING VERAFLO CLEANS CC MED (GAUZE/BANDAGES/DRESSINGS) IMPLANT
DRSG ADAPTIC 3X8 NADH LF (GAUZE/BANDAGES/DRESSINGS) ×2 IMPLANT
DRSG VAC PEEL AND PLACE LRG (GAUZE/BANDAGES/DRESSINGS) IMPLANT
DURAPREP 26ML APPLICATOR (WOUND CARE) ×2 IMPLANT
ELECTRODE REM PT RTRN 9FT ADLT (ELECTROSURGICAL) IMPLANT
GAUZE PAD ABD 8X10 STRL (GAUZE/BANDAGES/DRESSINGS) IMPLANT
GAUZE SPONGE 4X4 12PLY STRL (GAUZE/BANDAGES/DRESSINGS) IMPLANT
GLOVE BIOGEL PI IND STRL 9 (GLOVE) ×2 IMPLANT
GLOVE SURG ORTHO 9.0 STRL STRW (GLOVE) ×2 IMPLANT
GOWN STRL REUS W/ TWL XL LVL3 (GOWN DISPOSABLE) ×4 IMPLANT
GRAFT SKIN WND SURGICLOSE M95 (Tissue) IMPLANT
KIT BASIN OR (CUSTOM PROCEDURE TRAY) ×2 IMPLANT
KIT TURNOVER KIT B (KITS) ×2 IMPLANT
MANIFOLD NEPTUNE II (INSTRUMENTS) ×2 IMPLANT
PACK ORTHO EXTREMITY (CUSTOM PROCEDURE TRAY) ×2 IMPLANT
PAD ARMBOARD POSITIONER FOAM (MISCELLANEOUS) ×4 IMPLANT
PAD NEG PRESSURE SENSATRAC (MISCELLANEOUS) IMPLANT
SET HNDPC FAN SPRY TIP SCT (DISPOSABLE) IMPLANT
SOLN 0.9% NACL 1000 ML (IV SOLUTION) ×1 IMPLANT
SOLN 0.9% NACL POUR BTL 1000ML (IV SOLUTION) ×2 IMPLANT
STOCKINETTE IMPERVIOUS 9X36 MD (GAUZE/BANDAGES/DRESSINGS) IMPLANT
SUT ETHILON 2 0 PSLX (SUTURE) ×2 IMPLANT
SUT VIC AB 2-0 CT1 TAPERPNT 27 (SUTURE) IMPLANT
SWAB COLLECTION DEVICE MRSA (MISCELLANEOUS) ×2 IMPLANT
SWAB CULTURE ESWAB REG 1ML (MISCELLANEOUS) IMPLANT
TOWEL GREEN STERILE (TOWEL DISPOSABLE) ×2 IMPLANT
TUBE CONNECTING 12X1/4 (SUCTIONS) ×2 IMPLANT
YANKAUER SUCT BULB TIP NO VENT (SUCTIONS) ×2 IMPLANT

## 2024-09-29 NOTE — Transfer of Care (Signed)
 Immediate Anesthesia Transfer of Care Note  Patient: David King  Procedure(s) Performed: INCISION AND DRAINAGE OF DEEP ABSCESS, CALF (Right: Leg Lower)  Patient Location: PACU  Anesthesia Type:General  Level of Consciousness: awake, alert , and oriented  Airway & Oxygen  Therapy: Patient Spontanous Breathing and Patient connected to nasal cannula oxygen   Post-op Assessment: Report given to RN and Post -op Vital signs reviewed and stable  Post vital signs: Reviewed and stable  Last Vitals:  Vitals Value Taken Time  BP 100/77 09/29/24 13:50  Temp    Pulse 92 09/29/24 13:56  Resp 14 09/29/24 13:56  SpO2 99 % 09/29/24 13:56  Vitals shown include unfiled device data.  Last Pain:  Vitals:   09/29/24 1103  TempSrc:   PainSc: 5       Patients Stated Pain Goal: 0 (09/26/24 1411)  Complications: No notable events documented.

## 2024-09-29 NOTE — Progress Notes (Addendum)
 PHARMACY - ANTICOAGULATION CONSULT NOTE  Pharmacy Consult for Heparin  Indication:  arterial insufficiency / occlusion  Allergies  Allergen Reactions   Ancef  [Cefazolin ] Anaphylaxis    Patient Measurements: Height: 5' 9 (175.3 cm) Weight: 83.2 kg (183 lb 6.8 oz) IBW/kg (Calculated) : 70.7 HEPARIN  DW (KG): 83.2 Heparin  dosing weight = 83 kg  Vital Signs: Temp: 98.1 F (36.7 C) (10/01 1512) Temp Source: Oral (10/01 1512) BP: 109/75 (10/01 1512) Pulse Rate: 77 (10/01 1512)  Labs: Recent Labs    09/27/24 0349 09/28/24 0648 09/28/24 1808 09/29/24 0355 09/29/24 0948  HGB 7.5* 7.3*  --  9.4*  --   HCT 23.8* 22.5*  --  27.9*  --   PLT 118* 126*  --  134*  --   HEPARINUNFRC <0.10* 0.14* 0.22* 0.32 0.34  CREATININE 1.26* 1.04  --  1.02  --     Estimated Creatinine Clearance: 67.4 mL/min (by C-G formula based on SCr of 1.02 mg/dL).  Assessment: 20 YOM admitted w/ severe vascular insufficiency who is tentatively scheduled for right axillary bifemoral bypass w/ possible right common femoral to popliteal bypass. No PTA AC noted through a search of his EMR. Of note, he had recent lumbar fusion on 09/08/2024. Consult for heparin  therapy received 09/22/24. Patient subsequently transferred from CIR to inpatient on 9/25.   Patient is s/p R calf lateral fasciotomy washout, debridement of skin and soft tissue on 9/29 and I&D on 10/1. Therapeutic heparin  to be resumed now per MD.  Goal of Therapy:  Heparin  level 0.3-0.5 units/ml Monitor platelets by anticoagulation protocol: Yes   Plan:  Resume heparin  infusion at 1900 units/hr Check 8 hr heparin  level Monitor closely for bleeding  Raziyah Vanvleck D. L[place], PharmD, BCPS, BCCCP 09/29/2024, 3:37 PM

## 2024-09-29 NOTE — Plan of Care (Signed)

## 2024-09-29 NOTE — Progress Notes (Signed)
 PT Cancellation Note  Patient Details Name: David King MRN: [id number] DOB: [birth year]   Cancelled Treatment:    Reason Eval/Treat Not Completed: Patient at procedure or test/unavailable (Pt in OR for bil endarterectomy. Will follow up as able and appropriate.)  [name], DPT, CLT  Acute Rehabilitation Services Office: [phone] (Secure chat preferred)   [name] 09/29/2024, 2:53 PM

## 2024-09-29 NOTE — Progress Notes (Signed)
 PROGRESS NOTE    David King  FMW:[id number] DOB: [birth year] DOA: 09/24/2024 PCP: [name], MD    Brief Narrative: 68 y.o. male with PMH significant for HTN, HLD, PAD s/p b/l common [place] artery stenting, prostate cancer, COPD, diverticulosis, chronic back pain, lumbar radiculopathy, left eye with times detachment. Because of progressive radiculopathy pain, patient was following up with orthopedics as an outpatient.  He had previous L4-L5 disc decompression.  Recent MRI showed severe stenosis of the left L4-L5 due to large disc herniation.   -- 09/08/2024, patient underwent revision L4-L5 decompression, lumbar body fusion by Dr. Beuford.  During the procedure, patient had an anaphylactic reaction and developed hives with some hypotension. He required short-term intubation and transferred to ICU, post recovery he was noted to have some improvement in his left leg pain, postop site looked okay but he developed some right lower extremity weakness.  Neurology was consulted.  MRI brain was unremarkable.  EMG showed nonspecific changes.  -- 9/17, discharged to CIR. 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 -- 9/26, patient was readmitted to acute care with a plan of revascularization.  Patient underwent right axillary femoral bypass graft, bilateral common femoral, superficial femoral and profundofemoral thrombectomy, right leg anterior compartment fasciotomy due to compartment syndrome, right femoral endarterectomy. Soon after coming out of the OR, he was found to have decrease in his right lower extremity pulses again. Seen by vascular surgery, taken to the OR again.  Underwent right leg angiogram, found to have thrombus in the popliteal artery, underwent successful  thrombectomy. Postop, admitted to [facility] -- 9/29, underwent right calf lateral fasciotomy washout, skin debridement and wound VAC placement.    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 9/30 right calf lateral fasciotomy without any skin debridement and wound VAC placement Currently on heparin  drip, aspirin  81 mg daily, statin Vascular surgery following Per vascular note, bilateral lower extremities are well-perfused with brisk PT Doppler signals..  But right foot with no motor or sensory function.   Noted a plan from vascular surgery that Dr. Harden will take him to the OR again 10/1 S/P  Excisional debridement skin soft tissue muscle and fascia anterior compartment right leg.  Transfer of the lateral compartment muscles to the anterior compartment to cover the anterior tibial vessels.    Lumbar radiculopathy S/p recent L4-L5 decompression/fusion -9/10 Dr Beuford PT/OT eval Pain regimen --- Scheduled: OxyContin  10 mg twice daily, Lyrica  150 mg 3 times daily --- PRN: Morphine  2 mg every 4 hours, oxycodone  10 mg every 4 hours   Acute blood loss anemia Hemoglobin running low since surgery on 9/10. Further acute drop noted as low as 6.8 on 9/28.  1 unit PRBC given.hb 9.4 stable.   AKI Resolved  with IV hydration.     Acute urinary  retention Foul-smelling urine H/o prostate cancer  Patient had Foley catheter inserted on  admission.  Removed 9/27, needed multiple In-N-Out catheterization  9/30, patient voided 275 ml. bladder scan showed 394 ml and pt said he could void again. Voided another 250 ml.  Urine looks cloudy and smells foul per RN.   Patient probably needs reminders to urinate. Continue Flomax  as before.   Hyponatremia Mild.  Continue to monitor.improving   Hyperlipidemia Crestor  20 mg daily   COPD Bronchodilators as needed   Diverticulosis Encourage regular bowel regimen Currently on Colace 100 mg twice daily.   Anxiety/depression Celexa  20 mg daily, Cymbalta  nightly, Ambien  nightly as needed  Wound 09/15/24 2032 Pressure Injury Buttocks Bilateral;Right Deep Tissue Pressure Injury - Purple or maroon localized area of discolored intact skin or blood-filled blister due to damage of underlying soft tissue from pressure and/or shear. (Active)     Wound 09/15/24 1848 Pressure Injury Heel Right Deep Tissue Pressure Injury - Purple or maroon localized area of discolored intact skin or blood-filled blister due to damage of underlying soft tissue from pressure and/or shear. (Active)     Estimated body mass index is 27.09 kg/m as calculated from the following:   Height as of this encounter: 5' 9 (1.753 m).   Weight as of this encounter: 83.2 kg.  DVT prophylaxis: heparin  Code Status: full Family Communication: none Disposition Plan:  Status is: Inpatient Remains inpatient appropriate because:    Consultants: ortho vascular   Procedures: see above Antimicrobials:none  Subjective:  Seen prior to surgery  Objective: Vitals:   09/29/24 1430 09/29/24 1445 09/29/24 1500 09/29/24 1512  BP: 103/66 96/77 92/73  109/75  Pulse: 82 80 80 77  Resp: 11 14 12 19   Temp:   98 F (36.7 C) 98.1 F (36.7 C)  TempSrc:    Oral  SpO2: 95% 93% 95% 98%  Weight:      Height:        Intake/Output Summary (Last 24 hours) at 09/29/2024 1721 Last data filed at 09/29/2024 1400 Gross per 24 hour   Intake 1929.08 ml  Output 3050 ml  Net -1120.92 ml   Filed Weights   09/29/24 1049  Weight: 83.2 kg    Examination:  General exam: Appears in nad Respiratory system: Clear to auscultation. Respiratory effort normal. Cardiovascular system:reg Gastrointestinal system: Abdomen is nondistended, soft and nontender. No organomegaly or masses felt. Normal bowel sounds heard. Central nervous system: Alert and oriented. No focal neurological deficits. Extremities: rle with vac   Data Reviewed: I have personally reviewed following labs and imaging studies  CBC: Recent Labs  Lab 09/25/24 0500 09/26/24 0257 09/26/24 0852 09/27/24 0349 09/28/24 0648 09/29/24 0355  WBC 11.0* 9.5  --  6.4 5.3 5.1  NEUTROABS 9.3* 7.6  --  4.9 4.2 3.1  HGB 7.5* 6.8* 7.8* 7.5* 7.3* 9.4*  HCT 21.8* 20.5* 23.7* 23.8* 22.5* 27.9*  MCV 97.8 100.0  --  100.4* 95.7 92.4  PLT 165 137*  --  118* 126* 134*   Basic Metabolic Panel: Recent Labs  Lab 09/25/24 0500 09/26/24 0257 09/27/24 0349 09/28/24 0648 09/29/24 0355  NA 131* 133* 131* 131* 133*  K 4.2 4.1 3.9 4.4 3.7  CL 102 99 103 98 102  CO2 23 25 23 23 25   GLUCOSE 111* 109* 94 114* 96  BUN 10 11 12 15 16   CREATININE 0.92 1.28* 1.26* 1.04 1.02  CALCIUM  7.4* 7.6* 7.4* 7.6* 7.8*  MG 2.2 2.4 2.3 2.1  --  PHOS 3.7 2.9 3.4 3.6  --    GFR: Estimated Creatinine Clearance: 67.4 mL/min (by C-G formula based on SCr of 1.02 mg/dL). Liver Function Tests: Recent Labs  Lab 09/25/24 0500 09/26/24 0257 09/27/24 0349 09/28/24 0648 09/29/24 0355  AST 35 31 40 51* 59*  ALT 42 39 40 56* 70*  ALKPHOS 50 50 61 83 93  BILITOT 0.8 0.9 1.0 1.0 1.1  PROT 4.9* 5.1* 4.9* 5.1* 5.4*  ALBUMIN  2.4* 2.4* 2.1* 2.2* 2.3*   No results for input(s): LIPASE, AMYLASE in the last 168 hours. No results for input(s): AMMONIA in the last 168 hours. Coagulation Profile: No results for input(s): INR, PROTIME in the last 168 hours. Cardiac Enzymes: Recent Labs   Lab 09/24/24 0318  CKTOTAL 1,434*   BNP (last 3 results) No results for input(s): PROBNP in the last 8760 hours. HbA1C: No results for input(s): HGBA1C in the last 72 hours. CBG: No results for input(s): GLUCAP in the last 168 hours. Lipid Profile: No results for input(s): CHOL, HDL, LDLCALC, TRIG, CHOLHDL, LDLDIRECT in the last 72 hours. Thyroid Function Tests: No results for input(s): TSH, T4TOTAL, FREET4, T3FREE, THYROIDAB in the last 72 hours. Anemia Panel: No results for input(s): VITAMINB12, FOLATE, FERRITIN, TIBC, IRON, RETICCTPCT in the last 72 hours. Sepsis Labs: No results for input(s): PROCALCITON, LATICACIDVEN in the last 168 hours.  No results found for this or any previous visit (from the past 240 hours).    Radiology Studies: No results found.    Scheduled Meds:  aspirin  EC  81 mg Oral Daily   citalopram   20 mg Oral Daily   docusate sodium   100 mg Oral BID   DULoxetine   30 mg Oral QHS   oxyCODONE   10 mg Oral Q12H   pregabalin   150 mg Oral TID   rosuvastatin   20 mg Oral Daily   tamsulosin   0.4 mg Oral QPC supper   Continuous Infusions:  heparin  1,900 Units/hr (09/29/24 1652)     LOS: 5 days    Almarie KANDICE Hoots, MD 09/29/2024, 5:21 PM

## 2024-09-29 NOTE — Consult Note (Signed)
 ORTHOPAEDIC CONSULTATION  REQUESTING PHYSICIAN: Will Almarie MATSU, MD  Chief Complaint: Right lower extremity anterior compartment vascular injury.                                            HPI: David King is a 68 y.o. male who presents with ischemic injury to the right lower extremity.  Patient is status post lumbar spine surgery and status post vascular reconstruction with axillary bifemoral bypass and thrombectomy of the common femoral superficial femoral and profundus femoral bilaterally.  Patient underwent fasciotomies as well as resection of the necrotic muscle of the anterior compartment on the right.  Past Medical History:  Diagnosis Date   Anxiety    new dx   Arthritis    lumbar   Atherosclerotic vascular disease    Cataract    COPD (chronic obstructive pulmonary disease) (HCC)    Diverticulosis    Elevated PSA    Headache(784.0)    MIGRAINES   Hypertension 2021   Iritis    CHRONIC IN LEFT EYE - SOME VISIAL IMPAIRMENT IN LEFT EYE   Neuromuscular disorder (HCC)    left leg/foot,pinched siactic nerve   Pain    PAIN LEFT HIP AND DOWN LT LEG WITH NUMBNESS IN LEFT LEG--PT STATES SCIATIC NERVE IMPINGEMENT - PT PLANS BACK IN THE NEAR SURGERY.   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   Renal cysts, acquired, bilateral 03/19/2013   several simple , CT   Urinary frequency    AND NOCTURIA   Past Surgical History:  Procedure Laterality Date   ABDOMINAL AORTOGRAM W/LOWER EXTREMITY N/A 09/21/2024   Procedure: ABDOMINAL AORTOGRAM W/LOWER EXTREMITY;  Surgeon: Serene Gaile ORN, MD;  [facility]: [facility];  Service: Cardiovascular;  Laterality: N/A;   APPLICATION OF WOUND VAC Right 09/27/2024   Procedure: APPLICATION, WOUND VAC RIGHT LOWER EXTREMITY;  Surgeon: Lanis Fonda BRAVO, MD;  Location: [facility] OR;  Service: Vascular;  Laterality: Right;   APPLICATION, SKIN SUBSTITUTE Right 09/27/2024   Procedure: APPLICATION, MYRIAD SKIN  SUBSTITUTE;  Surgeon: Lanis Fonda BRAVO, MD;  Location: [facility] OR;  Service: Vascular;  Laterality: Right;   AXILLARY-FEMORAL BYPASS GRAFT Right 09/24/2024   Procedure: CREATION, BYPASS, ARTERIAL, AXILLARY TO BILATERAL FEMORAL, USING PROPATEN X 80CM X 2 GRAFT;  Surgeon: Serene Gaile ORN, MD;  Location: [facility] OR;  Service: Vascular;  Laterality: Right;   BACK SURGERY     CARDIAC CATHETERIZATION  03/10/2018   CATARACT EXTRACTION W/ INTRAOCULAR LENS IMPLANT Left    ENDARTERECTOMY FEMORAL Bilateral 09/24/2024   Procedure: BILATERAL FEMORAL ENDARTERECTOMY;  Surgeon: Serene Gaile ORN, MD;  Location: [facility] OR;  Service: Vascular;  Laterality: Bilateral;   EYE SURGERY     RETINAL SURGERY LEFT EYE   FASCIOTOMY Right 09/24/2024   Procedure: RIGHT LOWER LEG FOUR COMPARTMENT FASCIOTOMY WITH RESECTION OF TIBIALIS ANTERIOR;  Surgeon: Serene Gaile ORN, MD;  [facility]: [facility];  Service: Vascular;  Laterality: Right;   FASCIOTOMY CLOSURE Right 09/27/2024   Procedure: RIGHT LOWER EXTREMITY FASCIOTOMY WASHOUT;  Surgeon: Lanis Fonda BRAVO, MD;  Location: [facility] OR;  Service: Vascular;  Laterality: Right;  RIGHT LEG CLOSURE   HYDROCELE EXCISION Left 03/05/2013   Procedure: HYDROCELECTOMY ADULT;  Surgeon: Donnice Gwenyth Brooks, MD;  Location: [facility];  Service: Urology;  Laterality: Left;   INCISIONAL HERNIA REPAIR  N/A 06/18/2022   Procedure: REPAIR OF INTERNAL HERNIA;  Surgeon: Rubin Calamity, MD;  Location: [facility] OR;  Service: General;  Laterality: N/A;   INGUINAL HERNIA REPAIR Bilateral AGE 87   INSERTION OF MESH N/A 06/11/2022   Procedure: INSERTION OF MESH;  Surgeon: Rubin Calamity, MD;  Location: [facility] OR;  Service: General;  Laterality: N/A;   INSERTION OF MESH N/A 06/18/2022   Procedure: INSERTION OF MESH;  Surgeon: Rubin Calamity, MD;  Location: [facility] OR;  Service: General;  Laterality: N/A;   KYPHOPLASTY N/A 05/08/2023   Procedure: THORACIC SEVEN KYPHOPLASTY;  Surgeon: Beuford Anes, MD;  [facility]  OR;  Service: Orthopedics;  Laterality: N/A;   LAPAROSCOPY N/A 06/18/2022   Procedure: LAPAROSCOPY DIAGNOSTIC;  Surgeon: Rubin Calamity, MD;  Location: [facility] OR;  Service: General;  Laterality: N/A;   LOWER EXTREMITY ANGIOGRAM Right 09/24/2024   Procedure: RIGHT LOWER EXTREMITY ANGIOGRAM AND RIGHT POPLETEAL THROMBECTOMY;  Surgeon: Serene Gaile ORN, MD;  [facility];  Service: Vascular;  Laterality: Right;   LYMPHADENECTOMY Bilateral 05/06/2013   Procedure: REDGIE;  Surgeon: Noretta Ferrara, MD;  Location: [facility] ORS;  Service: Urology;  Laterality: Bilateral;   PROSTATE BIOPSY N/A 03/05/2013   Procedure: PROSTATE BIOPSY and ultrasound;  Surgeon: Donnice Gwenyth Brooks, MD;  Location: [facility];  Service: Urology;  Laterality: N/A;   REMOVAL BURSA SAC, LEFT ELBOW  1996   ROBOT ASSISTED LAPAROSCOPIC RADICAL PROSTATECTOMY N/A 05/06/2013   Procedure: ROBOTIC ASSISTED LAPAROSCOPIC RADICAL PROSTATECTOMY LEVEL 3;  Surgeon: Noretta Ferrara, MD;  Location: [facility] ORS;  Service: Urology;  Laterality: N/A;   THROMBECTOMY FEMORAL ARTERY Bilateral 09/24/2024   Procedure: BILATERAL FEMORAL ARTERY THROMBECTOMY;  Surgeon: Serene Gaile ORN, MD;  [facility];  Service: Vascular;  Laterality: Bilateral;   TONSILLECTOMY     as achild   TRANSFORAMINAL LUMBAR INTERBODY FUSION (TLIF) WITH PEDICLE SCREW FIXATION 1 LEVEL Left 09/08/2024   Procedure: LEFT-SIDED LUMBAR 4- LUMBAR 5 TRANSFORAMINAL LUMBAR INTERBODY FUSION AND DECOMPRESSION WITH INSTRUMENTATION AND ALLOGRAFT;  Surgeon: Beuford Anes, MD;  Location: [facility] OR;  Service: Orthopedics;  Laterality: Left;  LEFT-SIDED LUMBAR 4- LUMBAR 5 TRANSFORAMINAL LUMBAR INTERBODY FUSION AND DECOMPRESSION WITH INSTRUMENTATION AND ALLOGRAFT   XI ROBOTIC ASSISTED VENTRAL HERNIA N/A 06/11/2022   Procedure: ROBOTIC INCISIONAL HERNIA REPAIR WITH MESH;  Surgeon: Rubin Calamity, MD;  Location: [facility] OR;  Service: General;  Laterality: N/A;   Social History   Socioeconomic  History   Marital status: Married    Spouse name: Not on file   Number of children: Not on file   Years of education: Not on file   Highest education level: Not on file  Occupational History   Not on file  Tobacco Use   Smoking status: Former    Current packs/day: 0.00    Average packs/day: 2.0 packs/day for 47.0 years (94.0 ttl pk-yrs)    Types: Cigarettes    Start date: 12/31/1969    Quit date: 12/31/2016    Years since quitting: 7.7   Smokeless tobacco: Never   Tobacco comments:    Patient is currently smoke free since January 2018  Vaping Use   Vaping status: Never Used  Substance and Sexual Activity   Alcohol use: Yes    Alcohol/week: 14.0 standard drinks of alcohol    Types: 14 Shots of liquor per week    Comment: couple of drinks a night   Drug use: No   Sexual activity: Never  Other Topics Concern   Not on file  Social History Narrative   Not on file   Social Drivers of Health   Financial Resource Strain: Low Risk  (05/05/2023)   Received from [facility]   Overall Financial Resource Strain (CARDIA)    Difficulty of Paying Living Expenses: Not hard at all  Food Insecurity: Patient Declined (09/29/2024)   Hunger Vital Sign    Worried About Programme researcher, broadcasting/film/video in the Last Year: Patient declined    Ran Out of Food in the Last Year: Patient declined  Transportation Needs: Patient Declined (09/29/2024)   PRAPARE - Administrator, Civil Service (Medical): Patient declined    Lack of Transportation (Non-Medical): Patient declined  Physical Activity: Unknown (05/05/2023)   Received from [facility]   Exercise Vital Sign    On average, how many days per week do you engage in moderate to strenuous exercise (like a brisk walk)?: 0 days    Minutes of Exercise per Session: Not on file  Stress: No Stress Concern Present (05/05/2023)   Received from [facility] of Occupational Health - Occupational Stress Questionnaire    Feeling of Stress :  Only a little  Social Connections: Unknown (09/29/2024)   Social Connection and Isolation Panel    Frequency of Communication with Friends and Family: Patient declined    Frequency of Social Gatherings with Friends and Family: Patient unable to answer    Attends Religious Services: Patient declined    Database administrator or Organizations: Patient declined    Attends Engineer, structural: Patient declined    Marital Status: Patient declined   Family History  Problem Relation Age of Onset   Cancer Father        prostate cancer   Cancer Paternal Grandfather        prostate   Colon cancer Neg Hx    Colon polyps Neg Hx    Esophageal cancer Neg Hx    Rectal cancer Neg Hx    Stomach cancer Neg Hx    - negative except otherwise stated in the family history section Allergies  Allergen Reactions   Ancef  [Cefazolin ] Anaphylaxis   Prior to Admission medications   Medication Sig Start Date End Date Taking? Authorizing Provider  acetaminophen  (TYLENOL ) 325 MG tablet Take 1 tablet (325 mg total) by mouth every 4 (four) hours as needed for mild pain (pain score 1-3) or fever (or temp > 100.5). 09/22/24   Angiulli, Toribio PARAS, PA-C  albuterol  (PROVENTIL ) (2.5 MG/3ML) 0.083% nebulizer solution Inhale 3 mLs into the lungs every 6 (six) hours as needed for wheezing or shortness of breath. 09/22/24   Angiulli, Toribio PARAS, PA-C  ascorbic acid  (VITAMIN C ) 1000 MG tablet Take 1 tablet (1,000 mg total) by mouth daily. 09/22/24   Angiulli, Toribio PARAS, PA-C  aspirin  EC 81 MG tablet Take 1 tablet (81 mg total) by mouth daily. Swallow whole. 09/22/24   Angiulli, Toribio PARAS, PA-C  bisacodyl  (DULCOLAX) 5 MG EC tablet Take 1 tablet (5 mg total) by mouth daily as needed for moderate constipation. 09/22/24   Angiulli, Toribio PARAS, PA-C  cholecalciferol (VITAMIN D3) 25 MCG (1000 UNIT) tablet Take 1,000 Units by mouth daily.    [provider]  citalopram  (CELEXA ) 20 MG tablet Take 1 tablet (20 mg total) by mouth  daily. 09/23/24   Angiulli, Toribio PARAS, PA-C  docusate sodium  (COLACE) 100 MG capsule Take 1 capsule (100 mg total) by mouth 2 (two) times daily. 09/22/24  Angiulli, Toribio PARAS, PA-C  DULoxetine  (CYMBALTA ) 30 MG capsule Take 1 capsule (30 mg total) by mouth at bedtime. 09/22/24   Angiulli, Toribio PARAS, PA-C  heparin  25000 UT/250ML infusion Inject 1,500 Units/hr into the vein continuous. 09/23/24   Angiulli, Toribio PARAS, PA-C  lidocaine  (LIDODERM ) 5 % Place 2 patches onto the skin daily. Remove & Discard patch within 12 hours or as directed by MD 09/22/24   Angiulli, Toribio PARAS, PA-C  magnesium  gluconate (MAGONATE) 500 (27 Mg) MG TABS tablet Take 0.5 tablets (250 mg total) by mouth at bedtime. 09/22/24   Angiulli, Toribio PARAS, PA-C  methocarbamol  1000 MG TABS Take 1,000 mg by mouth 4 (four) times daily. 09/22/24   Angiulli, Toribio PARAS, PA-C  ondansetron  (ZOFRAN ) 4 MG tablet Take 1 tablet (4 mg total) by mouth every 6 (six) hours as needed for nausea or vomiting. 09/22/24   Angiulli, Toribio PARAS, PA-C  oxyCODONE  (OXYCONTIN ) 10 mg 12 hr tablet Take 1 tablet (10 mg total) by mouth every 12 (twelve) hours. 09/22/24   Angiulli, Toribio PARAS, PA-C  oxyCODONE  (ROXICODONE ) 15 MG immediate release tablet Take 1 tablet (15 mg total) by mouth every 4 (four) hours as needed for severe pain (pain score 7-10). 09/22/24   Angiulli, Toribio PARAS, PA-C  Oxycodone  HCl 10 MG TABS Take 1 tablet (10 mg total) by mouth every 4 (four) hours as needed for moderate pain (pain score 4-6). 09/22/24   Angiulli, Toribio PARAS, PA-C  pravastatin  (PRAVACHOL ) 10 MG tablet Take 1 tablet (10 mg total) by mouth daily at 6 PM. 09/22/24   Angiulli, Toribio PARAS, PA-C  pregabalin  (LYRICA ) 150 MG capsule Take 1 capsule (150 mg total) by mouth 3 (three) times daily. 09/22/24   Angiulli, Toribio PARAS, PA-C  rosuvastatin  (CRESTOR ) 20 MG tablet Take 1 tablet (20 mg total) by mouth daily. 09/23/24   Angiulli, Toribio PARAS, PA-C  senna-docusate (SENOKOT-S) 8.6-50 MG tablet Take 1 tablet by mouth at  bedtime as needed for mild constipation. 09/22/24   Angiulli, Toribio PARAS, PA-C  sodium chloride  1 g tablet Take 1 tablet (1 g total) by mouth 2 (two) times daily with a meal. 09/22/24   Angiulli, Toribio PARAS, PA-C  sorbitol  70 % SOLN Take 30 mLs by mouth as needed for moderate constipation (severe constipation). 09/23/24   Angiulli, Toribio PARAS, PA-C  tamsulosin  (FLOMAX ) 0.4 MG CAPS capsule Take 1 capsule (0.4 mg total) by mouth daily after supper. 09/22/24   Angiulli, Toribio PARAS, PA-C  zinc  sulfate, 50mg  elemental zinc , 220 (50 Zn) MG capsule Take 1 capsule (220 mg total) by mouth daily. 09/22/24   Angiulli, Toribio PARAS, PA-C  zolpidem  (AMBIEN ) 5 MG tablet Take 1 tablet (5 mg total) by mouth at bedtime as needed for sleep. 09/22/24   Angiulli, Toribio PARAS, PA-C   No results found. - pertinent xrays, CT, MRI studies were reviewed and independently interpreted  Positive ROS: All other systems have been reviewed and were otherwise negative with the exception of those mentioned in the HPI and as above.  Physical Exam: General: Alert, no acute distress Psychiatric: Patient is competent for consent with normal mood and affect Lymphatic: No axillary or cervical lymphadenopathy Cardiovascular: No pedal edema Respiratory: No cyanosis, no use of accessory musculature GI: No organomegaly, abdomen is soft and non-tender    Images:  @ENCIMAGES @  Labs:  Lab Results  Component Value Date   HGBA1C 5.2 09/09/2024   HGBA1C 5.3 06/17/2022   REPTSTATUS 08/27/2022 FINAL 08/22/2022   GRAMSTAIN  08/22/2022    RARE WBC PRESENT, PREDOMINANTLY MONONUCLEAR NO ORGANISMS SEEN    CULT  08/22/2022    No growth aerobically or anaerobically. Performed at [facility], [address], [place], [place] [address]     Lab Results  Component Value Date   ALBUMIN  2.3 (L) 09/29/2024   ALBUMIN  2.2 (L) 09/28/2024   ALBUMIN  2.1 (L) 09/27/2024        Latest Ref Rng & Units 09/29/2024    3:55 AM 09/28/2024    6:48 AM  09/27/2024    3:49 AM  CBC EXTENDED  WBC 4.0 - 10.5 K/uL 5.1  5.3  6.4   RBC 4.22 - 5.81 MIL/uL 3.02  2.35  2.37   Hemoglobin 13.0 - 17.0 g/dL 9.4  7.3  7.5   HCT 60.9 - 52.0 % 27.9  22.5  23.8   Platelets 150 - 400 K/uL 134  126  118   NEUT# 1.7 - 7.7 K/uL 3.1  4.2  4.9   Lymph# 0.7 - 4.0 K/uL 1.1  0.6  0.7     Neurologic: Patient does not have protective sensation bilateral lower extremities.   MUSCULOSKELETAL:   Skin: Examination the wound VAC is intact and functioning well.  Hemoglobin 9.4 with a white cell count of 5.1.  Albumin  2.3.  Hemoglobin A1c 5.2.  Assessment: Assessment: Ischemic injury to right lower extremity with 4 compartment fasciotomies and status post excisional debridement of the nonviable anterior compartment.  Plan: Plan: Will plan for repeat debridement of the right lower extremity.  Anticipate placement of Kerecis micro graft within the anterior compartment and placement of a cleanse choice wound VAC sponge covered with the peel and place wound VAC sponge.  Will plan for repeat debridement with Dr. Silver on Friday and I will plan for repeat debridement next Wednesday.  Risks and benefits were discussed for limb salvage intervention.  Thank you for the consult and the opportunity to see Mr. Wilkie Zenon, MD [facility] [phone] 7:26 AM

## 2024-09-29 NOTE — Anesthesia Procedure Notes (Signed)
 Procedure Name: LMA Insertion Date/Time: 09/29/2024 12:48 PM  Performed by: Thanya Cegielski, CRNAPre-anesthesia Checklist: Patient identified, Emergency Drugs available, Suction available and Patient being monitored Patient Re-evaluated:Patient Re-evaluated prior to induction Oxygen  Delivery Method: Circle System Utilized Preoxygenation: Pre-oxygenation with 100% oxygen  Induction Type: IV induction LMA: LMA inserted LMA Size: 5.0 Number of attempts: 1 Airway Equipment and Method: Bite block Placement Confirmation: positive ETCO2 Tube secured with: Tape Dental Injury: Teeth and Oropharynx as per pre-operative assessment  Comments: LMA placed by Len KET under MD/DO and CRNA supervision.

## 2024-09-29 NOTE — Progress Notes (Addendum)
 Progress Note    09/29/2024 8:48 AM 2 Days Post-Op  Subjective: Says his right leg feels about the same    Vitals:   09/29/24 0743 09/29/24 0800  BP: 139/75 (!) 130/115  Pulse: 71   Resp: 16 16  Temp: 98.3 F (36.8 C)   SpO2: 96% 97%    Physical Exam: General: Laying in bed, no acute distress Cardiac: Regular Lungs: Nonlabored Incisions: All incisions healing appropriately and dry.  Right lateral leg wound with wound VAC with good seal Extremities: Brisk right PT Doppler signal.  Right foot is warm to touch with no return of sensation or motor   CBC    Component Value Date/Time   WBC 5.1 09/29/2024 0355   RBC 3.02 (L) 09/29/2024 0355   HGB 9.4 (L) 09/29/2024 0355   HCT 27.9 (L) 09/29/2024 0355   PLT 134 (L) 09/29/2024 0355   MCV 92.4 09/29/2024 0355   MCH 31.1 09/29/2024 0355   MCHC 33.7 09/29/2024 0355   RDW 16.7 (H) 09/29/2024 0355   LYMPHSABS 1.1 09/29/2024 0355   MONOABS 0.5 09/29/2024 0355   EOSABS 0.2 09/29/2024 0355   BASOSABS 0.0 09/29/2024 0355    BMET    Component Value Date/Time   NA 133 (L) 09/29/2024 0355   K 3.7 09/29/2024 0355   CL 102 09/29/2024 0355   CO2 25 09/29/2024 0355   GLUCOSE 96 09/29/2024 0355   BUN 16 09/29/2024 0355   CREATININE 1.02 09/29/2024 0355   CREATININE 1.06 10/10/2020 0915   CALCIUM  7.8 (L) 09/29/2024 0355   GFRNONAA >60 09/29/2024 0355   GFRNONAA 73 10/10/2020 0915   GFRAA 84 10/10/2020 0915    INR    Component Value Date/Time   INR 1.2 08/22/2022 0833     Intake/Output Summary (Last 24 hours) at 09/29/2024 0848 Last data filed at 09/29/2024 0536 Gross per 24 hour  Intake 588 ml  Output 2675 ml  Net -2087 ml      Assessment/Plan:  68 y.o. male is s/p: #1: Right axillary bifemoral bypass graft                       #2: Bilateral common femoral, superficial femoral, and profundofemoral thrombectomy                       #3: Redo left common femoral artery exposure                       #4: 4  compartment right leg fasciotomy                       #5: Resection of necrotic muscle in the right anterior compartment                       #6: Bilateral femoral endarterectomy   With same-day takeback for right popliteal artery exposure, right popliteal thrombectomy, right tibial thrombectomy  Also s/p: Right lateral calf fasciotomy washout, debridement of skin and soft tissue, and wound VAC placement  -He says he feels about the same this morning.  He continues to have pain in his right lower leg -Bilateral lower extremities are well-perfused with brisk PT Doppler signals -All of his incisions are healing appropriately.  Right lateral lower leg with wound VAC with good seal -Appreciate Dr. Crist care.  He plans for right lateral lower leg washout today. We will repeat  washout and wound VAC change on Friday   Ahmed Holster, [place] [facility] [phone] 09/29/2024 8:48 AM   I have independently interviewed and examined patient and agree with PA assessment and plan above.  OR today with Dr. Harden.  Maha Fischel C. Sheree, MD [facility] [place] Office: [phone] Pager: [phone]

## 2024-09-29 NOTE — Progress Notes (Signed)
 Inpatient Rehab Admissions Coordinator:    CIR following. Note he went to OR today, will follow up when he is working with therapies post operatively.   Leita Kleine, MS, CCC-SLP Rehab Admissions Coordinator  [phone] (celll) [phone] (office)

## 2024-09-29 NOTE — Anesthesia Preprocedure Evaluation (Addendum)
 Anesthesia Evaluation  Patient identified by MRN, date of birth, ID band Patient awake    Reviewed: Allergy  & Precautions, NPO status , Patient's Chart, lab work & pertinent test results, reviewed documented beta blocker date and time   History of Anesthesia Complications Negative for: history of anesthetic complications  Airway Mallampati: III  TM Distance: >3 FB     Dental no notable dental hx. (+) Poor Dentition, Missing   Pulmonary COPD,  COPD inhaler, former smoker   breath sounds clear to auscultation       Cardiovascular hypertension, + angina  + CAD and + Peripheral Vascular Disease  (-) CABG (-) Cardiac Defibrillator  Rhythm:Regular Rate:Normal     Neuro/Psych  Headaches PSYCHIATRIC DISORDERS Anxiety      Neuromuscular disease    GI/Hepatic ,GERD  ,,(+) neg Cirrhosis        Endo/Other    Renal/GU Renal disease     Musculoskeletal  (+) Arthritis ,    Abdominal   Peds  Hematology  (+) Blood dyscrasia, anemia   Anesthesia Other Findings   Reproductive/Obstetrics                              Anesthesia Physical Anesthesia Plan  ASA: 3  Anesthesia Plan: General   Post-op Pain Management:    Induction: Intravenous  PONV Risk Score and Plan: 2 and Ondansetron  and Dexamethasone   Airway Management Planned: LMA  Additional Equipment:   Intra-op Plan:   Post-operative Plan: Extubation in OR  Informed Consent: I have reviewed the patients History and Physical, chart, labs and discussed the procedure including the risks, benefits and alternatives for the proposed anesthesia with the patient or authorized representative who has indicated his/her understanding and acceptance.     Dental advisory given  Plan Discussed with: CRNA  Anesthesia Plan Comments:          Anesthesia Quick Evaluation

## 2024-09-29 NOTE — Progress Notes (Signed)
 PHARMACY - ANTICOAGULATION CONSULT NOTE  Pharmacy Consult for Heparin  Indication:  arterial insufficiency / occlusion  Allergies  Allergen Reactions   Ancef  [Cefazolin ] Anaphylaxis    Patient Measurements: Height: 5' 9 (175.3 cm) IBW/kg (Calculated) : 70.7 Heparin  dosing weight = 83 kg  Vital Signs: Temp: 98.4 F (36.9 C) (10/01 0333) Temp Source: Oral (10/01 0333) BP: 99/77 (10/01 0333) Pulse Rate: 77 (10/01 0333)  Labs: Recent Labs    09/27/24 0349 09/28/24 0648 09/28/24 1808 09/29/24 0355  HGB 7.5* 7.3*  --  9.4*  HCT 23.8* 22.5*  --  27.9*  PLT 118* 126*  --  134*  HEPARINUNFRC <0.10* 0.14* 0.22* 0.32  CREATININE 1.26* 1.04  --   --     Estimated Creatinine Clearance: 66.1 mL/min (by C-G formula based on SCr of 1.04 mg/dL).  Assessment: 80 YOM admitted w/ severe vascular insufficiency who is tentatively scheduled for right axillary bifemoral bypass w/ possible right common femoral to popliteal bypass. No PTA AC noted through a search of his EMR. Of note, he had recent lumbar fusion on 09/08/2024. Consult for heparin  therapy received 09/22/24. Patient subsequently transferred from CIR to inpatient on 9/25. Heparin  decreased to 500 units/hr per [facility].   Patient now post op from R calf lateral fasciotomy washout, debridement of skin and soft tissue. Therapeutic heparin  resumed postop in the evening.  AM: Heparin  level therapeutic at 0.32 units/mL but trending up.  Lab drawn appropriately and no issue with heparin  infusion.  No bleeding reported and CBC shows Hgb 9, plts 134  Goal of Therapy:  Heparin  level 0.3-0.5 units/ml Monitor platelets by anticoagulation protocol: Yes   Plan:  Continue heparin  infusion at 1900 units/hr Check confirmatory heparin  level in 8h  CBC daily  Lynwood Poplar, PharmD, BCPS Clinical Pharmacist 09/29/2024 4:44 AM

## 2024-09-29 NOTE — Op Note (Signed)
 09/29/2024  3:27 PM  PATIENT:  David King    PRE-OPERATIVE DIAGNOSIS:  Compartment Syndrome Right Calf  POST-OPERATIVE DIAGNOSIS:  Same  PROCEDURE: Excisional debridement skin soft tissue muscle and fascia anterior compartment right leg.  Transfer of the lateral compartment muscles to the anterior compartment to cover the anterior tibial vessels. Application of Kerecis micro graft 95 cm. Local tissue transfer for partial wound closure 45 cm x 6 cm. Application cleanse choice wound VAC sponges x 2. Application of peel in place wound VAC sponges x 2.  SURGEON:  Jerona LULLA Sage, MD  PHYSICIAN ASSISTANT:None ANESTHESIA:   General  PREOPERATIVE INDICATIONS:  David King is a  68 y.o. male with a diagnosis of Compartment Syndrome Right Calf who failed conservative measures and elected for surgical management.    The risks benefits and alternatives were discussed with the patient preoperatively including but not limited to the risks of infection, bleeding, nerve injury, cardiopulmonary complications, the need for revision surgery, among others, and the patient was willing to proceed.  OPERATIVE IMPLANTS:   Implant Name Type Inv. Item Serial No. Manufacturer Lot No. LRB No. Used Action  GRAFT SKIN WND SURGICLOSE [id number] - [id number] Tissue GRAFT SKIN WND [place]  [employer] [id number] Right 1 Implanted    @ENCIMAGES @  OPERATIVE FINDINGS: Preoperatively patient had ischemic tissue over the anterior tibial vessels.  A Doppler was used and patient had a biphasic signal in the anterior tibial vessel and a biphasic signal in the posterior tibial vessel.  There was diminished biphasic flow over the dorsalis pedis artery.  OPERATIVE PROCEDURE: Patient was brought the operating room and underwent a general anesthetic.  After adequate levels anesthesia were obtained patient's right lower extremity was prepped using DuraPrep draped into a sterile field a timeout was called.  The anterior  tibial vessel was protected throughout the procedure.  There was ischemic soft tissue including the edges of the skin muscle and fascia.  Further skin muscle and fascia was excised.  The tibia was debrided back to petechial bleeding.  The fascia around the lateral compartment was excised to allow for advancement of the lateral compartment.  The wound was irrigated with Vashe x 2 and 95 cm of Kerecis was applied over the exposed tibia and over the anterior tibial vessels.  This was used to cover wound surface area greater than 300 cm.  The lateral muscle group was elevated and sutured to the fascia over the tibial crest to cover the anterior tibial vessels.  Local tissue transfer after undermining was then used to bring the skin over the cleanse choice wound VAC sponges that were applied deep within the wound.  The wound was reapproximated with 2-0 nylon.  Tissue transfer 45 x 6 cm.  The wound was then covered with 2 of the peel in place wound VAC sponges this was secured with derma tack and Coban.  This had a good suction fit patient was extubated taken the PACU in stable condition.   DISCHARGE PLANNING:  Antibiotic duration: Continue antibiotics  Weightbearing: Patient may work on transfer training  Pain medication: Opioid pathway  Dressing care/ Wound VAC: Continue wound VAC  Ambulatory devices: Walker  Proposed surgical plan, Dr. Silver to repeat debridement and tissue grafting on Friday.  I will plan to repeat debridement next Wednesday.  Patient does have ischemic changes to the great toe as well as the heel.  Follow-up: In the office 1 week post operative.

## 2024-09-30 ENCOUNTER — Inpatient Hospital Stay (HOSPITAL_COMMUNITY)

## 2024-09-30 ENCOUNTER — Encounter (HOSPITAL_COMMUNITY): Payer: Self-pay | Admitting: Orthopedic Surgery

## 2024-09-30 DIAGNOSIS — I739 Peripheral vascular disease, unspecified: Secondary | ICD-10-CM | POA: Diagnosis not present

## 2024-09-30 DIAGNOSIS — Z48812 Encounter for surgical aftercare following surgery on the circulatory system: Secondary | ICD-10-CM

## 2024-09-30 LAB — CBC
HCT: 29 % — ABNORMAL LOW (ref 39.0–52.0)
HCT: 30.3 % — ABNORMAL LOW (ref 39.0–52.0)
Hemoglobin: 9.5 g/dL — ABNORMAL LOW (ref 13.0–17.0)
Hemoglobin: 9.9 g/dL — ABNORMAL LOW (ref 13.0–17.0)
MCH: 30.4 pg (ref 26.0–34.0)
MCH: 30.6 pg (ref 26.0–34.0)
MCHC: 32.8 g/dL (ref 30.0–36.0)
MCHC: 32.7 g/dL (ref 30.0–36.0)
MCV: 92.7 fL (ref 80.0–100.0)
MCV: 93.5 fL (ref 80.0–100.0)
Platelets: 150 K/uL (ref 150–400)
Platelets: 150 K/uL (ref 150–400)
RBC: 3.13 MIL/uL — ABNORMAL LOW (ref 4.22–5.81)
RBC: 3.24 MIL/uL — ABNORMAL LOW (ref 4.22–5.81)
RDW: 15.9 % — ABNORMAL HIGH (ref 11.5–15.5)
RDW: 15.8 % — ABNORMAL HIGH (ref 11.5–15.5)
WBC: 6 K/uL (ref 4.0–10.5)
WBC: 8.7 K/uL (ref 4.0–10.5)
nRBC: 0 % (ref 0.0–0.2)
nRBC: 0 % (ref 0.0–0.2)

## 2024-09-30 LAB — HEPARIN LEVEL (UNFRACTIONATED)
Heparin Unfractionated: 0.38 [IU]/mL (ref 0.30–0.70)
Heparin Unfractionated: 0.38 [IU]/mL (ref 0.30–0.70)

## 2024-09-30 LAB — BASIC METABOLIC PANEL WITH GFR
Anion gap: 9 (ref 5–15)
BUN: 13 mg/dL (ref 8–23)
CO2: 24 mmol/L (ref 22–32)
Calcium: 8.3 mg/dL — ABNORMAL LOW (ref 8.9–10.3)
Chloride: 99 mmol/L (ref 98–111)
Creatinine, Ser: 1.09 mg/dL (ref 0.61–1.24)
GFR, Estimated: >60 mL/min (ref >60)
Glucose, Bld: 105 mg/dL — ABNORMAL HIGH (ref 70–99)
Potassium: 4 mmol/L (ref 3.5–5.1)
Sodium: 132 mmol/L — ABNORMAL LOW (ref 135–145)

## 2024-09-30 LAB — COMPREHENSIVE METABOLIC PANEL WITH GFR
ALT: 64 U/L — ABNORMAL HIGH (ref 0–44)
AST: 45 U/L — ABNORMAL HIGH (ref 15–41)
Albumin: 2.5 g/dL — ABNORMAL LOW (ref 3.5–5.0)
Alkaline Phosphatase: 90 U/L (ref 38–126)
Anion gap: 12 (ref 5–15)
BUN: 16 mg/dL (ref 8–23)
CO2: 22 mmol/L (ref 22–32)
Calcium: 8.2 mg/dL — ABNORMAL LOW (ref 8.9–10.3)
Chloride: 101 mmol/L (ref 98–111)
Creatinine, Ser: 1.21 mg/dL (ref 0.61–1.24)
GFR, Estimated: >60 mL/min (ref >60)
Glucose, Bld: 116 mg/dL — ABNORMAL HIGH (ref 70–99)
Potassium: 3.7 mmol/L (ref 3.5–5.1)
Sodium: 135 mmol/L (ref 135–145)
Total Bilirubin: 0.8 mg/dL (ref 0.0–1.2)
Total Protein: 5.7 g/dL — ABNORMAL LOW (ref 6.5–8.1)

## 2024-09-30 MED ORDER — ALBUTEROL SULFATE (2.5 MG/3ML) 0.083% IN NEBU
2.5000 mg | INHALATION_SOLUTION | Freq: Two times a day (BID) | RESPIRATORY_TRACT | Status: DC
  Administered 2024-09-30: 2.5 mg via RESPIRATORY_TRACT
  Filled 2024-09-30 (×2): qty 3

## 2024-09-30 MED ORDER — ALBUMIN HUMAN 25 % IV SOLN
12.5000 g | Freq: Four times a day (QID) | INTRAVENOUS | Status: AC
  Administered 2024-09-30 (×2): 12.5 g via INTRAVENOUS
  Filled 2024-09-30 (×2): qty 50

## 2024-09-30 MED ORDER — MIDODRINE HCL 5 MG PO TABS
5.0000 mg | ORAL_TABLET | Freq: Three times a day (TID) | ORAL | Status: DC
  Administered 2024-09-30 – 2024-10-02 (×4): 5 mg via ORAL
  Filled 2024-09-30 (×4): qty 1

## 2024-09-30 MED ORDER — ALBUTEROL SULFATE (2.5 MG/3ML) 0.083% IN NEBU
2.5000 mg | INHALATION_SOLUTION | Freq: Four times a day (QID) | RESPIRATORY_TRACT | Status: DC
  Administered 2024-09-30: 2.5 mg via RESPIRATORY_TRACT
  Filled 2024-09-30 (×2): qty 3

## 2024-09-30 MED ORDER — SODIUM CHLORIDE 0.9 % IV SOLN: INTRAVENOUS | Status: AC

## 2024-09-30 NOTE — Progress Notes (Signed)
 BP 63/50 MAP 56. Pt is experiencing dizziness laying supine. MD notified. Bolus, albumin  and midodrine given per order. Will continue to monitor.

## 2024-09-30 NOTE — Progress Notes (Signed)
 Patient ID: David King, male   DOB: 1956/06/17, 68 y.o.   MRN: [id number] Patient is postoperative day 1 repeat debridement right leg anterior compartment.  The lateral compartment was elevated to cover the anterior tibial structures.  There is a good suction fit with 250 cc of drainage in the wound VAC canister.  Anticipate vascular surgery repeat debridement tomorrow and I will plan for serial debridement this upcoming Wednesday.  Postoperative hemoglobin is stable at 9.6.  White cell count 6.0.

## 2024-09-30 NOTE — Progress Notes (Signed)
 PHARMACY - ANTICOAGULATION CONSULT NOTE  Pharmacy Consult for Heparin  Indication:  arterial insufficiency / occlusion  Allergies  Allergen Reactions   Ancef  [Cefazolin ] Anaphylaxis    Patient Measurements: Height: 5' 9 (175.3 cm) Weight: 83.2 kg (183 lb 6.8 oz) IBW/kg (Calculated) : 70.7 HEPARIN  DW (KG): 83.2 Heparin  dosing weight = 83 kg  Vital Signs: Temp: 98.3 F (36.8 C) (10/02 0818) Temp Source: Oral (10/02 0818) BP: 114/75 (10/02 0818) Pulse Rate: 73 (10/02 0818)  Labs: Recent Labs    09/28/24 9351 09/28/24 1808 09/29/24 0355 09/29/24 0948 09/30/24 0035 09/30/24 0505  HGB 7.3*  --  9.4*  --   --  9.5*  HCT 22.5*  --  27.9*  --   --  29.0*  PLT 126*  --  134*  --   --  150  HEPARINUNFRC 0.14*   < > 0.32 0.34 0.38 0.38  CREATININE 1.04  --  1.02  --   --  1.09   < > = values in this interval not displayed.    Estimated Creatinine Clearance: 63.1 mL/min (by C-G formula based on SCr of 1.09 mg/dL).  Assessment: 14 YOM admitted w/ severe vascular insufficiency who is tentatively scheduled for right axillary bifemoral bypass w/ possible right common femoral to popliteal bypass. No PTA AC noted through a search of his EMR. Of note, he had recent lumbar fusion on 09/08/2024. Consult for heparin  therapy received 09/22/24. Patient subsequently transferred from CIR to inpatient on 9/25.   Patient is s/p R calf lateral fasciotomy washout, debridement of skin and soft tissue on 9/29 and I&D on 10/1. Therapeutic heparin  to be resumed now per MD.  Heparin  therapeutic x2 on current rate. No issues with heparin  infusion or s/sx bleeding reported.  Goal of Therapy:  Heparin  level 0.3-0.5 units/ml Monitor platelets by anticoagulation protocol: Yes   Plan:  Cont heparin  at 1900 units/hr Check heparin  level daily while on heparin  Continue to monitor H&H and platelets  Thank you for allowing pharmacy to be a part of this patient's care.  Shelba Collier, PharmD,  BCPS Clinical Pharmacist

## 2024-09-30 NOTE — Progress Notes (Signed)
 PROGRESS NOTE    David King  FMW:[id number] DOB: 06-14-1956 DOA: 09/24/2024 PCP: [name], MD    Brief Narrative: 68 y.o. male with PMH significant for HTN, HLD, PAD s/p b/l common [place] artery stenting, prostate cancer, COPD, diverticulosis, chronic back pain, lumbar radiculopathy, left eye with times detachment. Because of progressive radiculopathy pain, patient was following up with orthopedics as an outpatient.  He had previous L4-L5 disc decompression.  Recent MRI showed severe stenosis of the left L4-L5 due to large disc herniation.   -- 09/08/2024, patient underwent revision L4-L5 decompression, lumbar body fusion by Dr. Beuford.  During the procedure, patient had an anaphylactic reaction and developed hives with some hypotension. He required short-term intubation and transferred to ICU, post recovery he was noted to have some improvement in his left leg pain, postop site looked okay but he developed some right lower extremity weakness.  Neurology was consulted.  MRI brain was unremarkable.  EMG showed nonspecific changes.  -- 9/17, discharged to CIR. 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 -- 9/26, patient was readmitted to acute care with a plan of revascularization.  Patient underwent right axillary femoral bypass graft, bilateral common femoral, superficial femoral and profundofemoral thrombectomy, right leg anterior compartment fasciotomy due to compartment syndrome, right femoral endarterectomy. Soon after coming out of the OR, he was found to have decrease in his right lower extremity pulses again. Seen by vascular surgery, taken to the OR again.  Underwent right leg angiogram, found to have thrombus in the popliteal artery, underwent successful  thrombectomy. Postop, admitted to [facility] -- 9/29, underwent right calf lateral fasciotomy washout, skin debridement and wound VAC placement.    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 9/30 right calf lateral fasciotomy without any skin debridement and wound VAC placement Currently on heparin  drip, aspirin  81 mg daily, statin Vascular surgery following Per vascular note, bilateral lower extremities are well-perfused with brisk PT Doppler signals..  But right foot with no motor or sensory function.   09/29/2024 Dr. Harden S/P  Excisional debridement skin soft tissue muscle and fascia anterior compartment right leg.  Transfer of the lateral compartment muscles to the anterior compartment to cover the anterior tibial vessels.  Repeat debridement 10/01/2024 with vascular And Dr. Harden to do serial debridement next Wednesday   Lumbar radiculopathy S/p recent L4-L5 decompression/fusion -9/10 Dr Beuford Continue morphine  and OxyContin  and oxycodone  Lyrica    Acute blood loss anemia-hemoglobin dropped as low as 6.8 status post 1 unit of packed RBC now stable at 9.5  AKI Resolved  with IV hydration.    Acute urinary retention Foul-smelling urine H/o prostate cancer he had Foley catheter inserted on admission.  This was removed on 27 September.  Since then he required multiple In-N-Out catheterization.  With routine reminders he is  able  to urinate without having a Foley in place.  Continue Flomax .   Hyponatremia Mild.  Continue to monitor.improving   Hyperlipidemia Crestor  20 mg daily   COPD Bronchodilators as needed   Diverticulosis Encourage regular bowel regimen Currently on Colace 100 mg twice daily.   Anxiety/depression Celexa  20 mg daily, Cymbalta  nightly, Ambien  nightly as needed  Wound 09/15/24 2032 Pressure Injury Buttocks Bilateral;Right Deep Tissue Pressure Injury - Purple or maroon localized area of discolored intact skin or blood-filled blister due to damage of underlying soft tissue from pressure and/or shear. (Active)     Wound 09/15/24 1848 Pressure Injury Heel Right Deep Tissue Pressure Injury - Purple or maroon localized area of discolored intact skin or blood-filled blister due to damage of underlying soft tissue from pressure and/or shear. (Active)     Estimated body mass index is 27.09 kg/m as calculated from the following:   Height as of this encounter: 5' 9 (1.753 m).   Weight as of this encounter: 83.2 kg.  DVT prophylaxis: heparin  Code Status: full Family Communication: none Disposition Plan:  Status is: Inpatient Remains inpatient appropriate because:    Consultants: ortho vascular   Procedures: see above Antimicrobials:none  Subjective: He is complaining of some difficulty breathing today when asked how long has it been going on he said 2 weeks he is having some dry cough denies chest pain nausea vomiting 8 out of 10 pain to his right lower extremity  Objective: Vitals:   09/29/24 2328 09/30/24 0406 09/30/24 0818 09/30/24 0920  BP: 114/89 111/78 114/75   Pulse: 67 74 73   Resp: 20 20 (!) 27   Temp: 98.3 F (36.8 C) 98.5 F (36.9 C) 98.3 F (36.8 C)   TempSrc: Oral Oral Oral   SpO2: 96% 96% 93% 96%  Weight:      Height:        Intake/Output Summary (Last 24 hours) at 09/30/2024 1229 Last data filed at 09/30/2024 0700 Gross per 24 hour  Intake 1399.2 ml  Output  1325 ml  Net 74.2 ml   Filed Weights   09/29/24 1049  Weight: 83.2 kg    Examination:  General exam: Appears in nad Respiratory system: Clear to auscultation. Respiratory effort normal. Cardiovascular system:reg Gastrointestinal system: Abdomen is nondistended, soft and nontender. No organomegaly or masses felt. Normal bowel sounds heard. Central nervous system: Alert and oriented. No focal neurological deficits. Extremities: rle with vac   Data Reviewed: I have personally reviewed following labs and imaging studies  CBC: Recent Labs  Lab 09/25/24 0500 09/26/24 0257 09/26/24 0852 09/27/24 0349 09/28/24 0648 09/29/24 0355 09/30/24 0505  WBC 11.0* 9.5  --  6.4 5.3 5.1 6.0  NEUTROABS 9.3* 7.6  --  4.9 4.2 3.1  --   HGB 7.5* 6.8* 7.8* 7.5* 7.3* 9.4* 9.5*  HCT 21.8* 20.5* 23.7* 23.8* 22.5* 27.9* 29.0*  MCV 97.8 100.0  --  100.4* 95.7 92.4 92.7  PLT 165 137*  --  118* 126* 134* 150   Basic Metabolic Panel: Recent Labs  Lab 09/25/24 0500 09/26/24 0257 09/27/24 0349 09/28/24 0648 09/29/24 0355 09/30/24 0505  NA 131* 133* 131* 131* 133* 132*  K 4.2 4.1 3.9 4.4 3.7 4.0  CL 102 99 103 98 102 99  CO2 23 25 23 23 25 24   GLUCOSE 111* 109* 94 114* 96 105*  BUN 10 11 12 15 16 13   CREATININE 0.92 1.28* 1.26* 1.04 1.02 1.09  CALCIUM  7.4* 7.6* 7.4* 7.6* 7.8* 8.3*  MG 2.2 2.4 2.3 2.1  --   --   PHOS 3.7 2.9 3.4 3.6  --   --    GFR: Estimated Creatinine Clearance: 63.1 mL/min (by C-G formula based on SCr of 1.09 mg/dL). Liver Function Tests: Recent Labs  Lab 09/25/24 0500 09/26/24 0257 09/27/24 0349 09/28/24 0648 09/29/24 0355  AST 35 31 40 51* 59*  ALT 42 39 40 56* 70*  ALKPHOS 50 50 61 83 93  BILITOT 0.8 0.9 1.0 1.0 1.1  PROT 4.9* 5.1* 4.9* 5.1* 5.4*  ALBUMIN  2.4* 2.4* 2.1* 2.2* 2.3*   No results for input(s): LIPASE, AMYLASE in the last 168 hours. No results for input(s): AMMONIA in the last 168 hours. Coagulation Profile: No results for input(s):  INR, PROTIME in the last 168 hours. Cardiac Enzymes: Recent Labs  Lab 09/24/24 0318  CKTOTAL 1,434*   BNP (last 3 results) No results for input(s): PROBNP in the last 8760 hours. HbA1C: No results for input(s): HGBA1C in the last 72 hours. CBG: No results for input(s): GLUCAP in the last 168 hours. Lipid Profile: No results for input(s): CHOL, HDL, LDLCALC, TRIG, CHOLHDL, LDLDIRECT in the last 72 hours. Thyroid Function Tests: No results for input(s): TSH, T4TOTAL, FREET4, T3FREE, THYROIDAB in the last 72 hours. Anemia Panel: No results for input(s): VITAMINB12, FOLATE, FERRITIN, TIBC, IRON, RETICCTPCT in the last 72 hours. Sepsis Labs: No results for input(s): PROCALCITON, LATICACIDVEN in the last 168 hours.  No results found for this or any previous visit (from the past 240 hours).    Radiology Studies: No results found.    Scheduled Meds:  albuterol   2.5 mg Nebulization BID   aspirin  EC  81 mg Oral Daily   citalopram   20 mg Oral Daily   docusate sodium   100 mg Oral BID   DULoxetine   30 mg Oral QHS   oxyCODONE   10 mg Oral Q12H   pregabalin   150 mg Oral TID   rosuvastatin   20 mg Oral Daily   tamsulosin   0.4 mg Oral QPC supper   Continuous Infusions:  heparin  1,900 Units/hr (09/29/24 1831)     LOS: 6 days    David KANDICE Hoots, MD 09/30/2024, 12:29 PM

## 2024-09-30 NOTE — Progress Notes (Signed)
   09/30/24 1300  Orthostatic Lying   BP- Lying 99/73  Orthostatic Sitting  BP- Sitting (!) 78/38   Orthostatics recorded during OT session. Pt symptomatic while sitting. Completed muscle contraction exercises, needed to improve blood flow, needed to return to supine. Once return to supine BP 77/61. After 3 minutes BP 85/62. RN and MD notified. See full OT note for further details.   Charnette Younkin C, OT  Acute Rehabilitation Services Office [phone] Secure chat preferred

## 2024-09-30 NOTE — Anesthesia Postprocedure Evaluation (Signed)
 Anesthesia Post Note  Patient: David King  Procedure(s) Performed: INCISION AND DRAINAGE OF DEEP ABSCESS, CALF (Right: Leg Lower)     Patient location during evaluation: PACU Anesthesia Type: General Level of consciousness: awake and alert Pain management: pain level controlled Vital Signs Assessment: post-procedure vital signs reviewed and stable Respiratory status: spontaneous breathing, nonlabored ventilation, respiratory function stable and patient connected to nasal cannula oxygen  Cardiovascular status: blood pressure returned to baseline and stable Postop Assessment: no apparent nausea or vomiting Anesthetic complications: no   No notable events documented.  Last Vitals:  Vitals:   09/29/24 2328 09/30/24 0406  BP: 114/89 111/78  Pulse: 67 74  Resp: 20 20  Temp: 36.8 C 36.9 C  SpO2: 96% 96%    Last Pain:  Vitals:   09/30/24 0406  TempSrc: Oral  PainSc:                  Lynwood MARLA Cornea

## 2024-09-30 NOTE — Progress Notes (Addendum)
 Progress Note    09/30/2024 8:09 AM 1 Day Post-Op  Subjective:  no complaints, eating breakfast    Vitals:   09/29/24 2328 09/30/24 0406  BP: 114/89 111/78  Pulse: 67 74  Resp: 20 20  Temp: 98.3 F (36.8 C) 98.5 F (36.9 C)  SpO2: 96% 96%    Physical Exam: General:  sitting up in bed, NAD  Cardiac:  regular Lungs:  non-labored Incisions:  all incisions healing appropriately. Right lateral leg wound with wound vac with good seal Extremities:  right toes warm to touch, no sensation or motor   CBC    Component Value Date/Time   WBC 6.0 09/30/2024 0505   RBC 3.13 (L) 09/30/2024 0505   HGB 9.5 (L) 09/30/2024 0505   HCT 29.0 (L) 09/30/2024 0505   PLT 150 09/30/2024 0505   MCV 92.7 09/30/2024 0505   MCH 30.4 09/30/2024 0505   MCHC 32.8 09/30/2024 0505   RDW 15.9 (H) 09/30/2024 0505   LYMPHSABS 1.1 09/29/2024 0355   MONOABS 0.5 09/29/2024 0355   EOSABS 0.2 09/29/2024 0355   BASOSABS 0.0 09/29/2024 0355    BMET    Component Value Date/Time   NA 132 (L) 09/30/2024 0505   K 4.0 09/30/2024 0505   CL 99 09/30/2024 0505   CO2 24 09/30/2024 0505   GLUCOSE 105 (H) 09/30/2024 0505   BUN 13 09/30/2024 0505   CREATININE 1.09 09/30/2024 0505   CREATININE 1.06 10/10/2020 0915   CALCIUM  8.3 (L) 09/30/2024 0505   GFRNONAA >60 09/30/2024 0505   GFRNONAA 73 10/10/2020 0915   GFRAA 84 10/10/2020 0915    INR    Component Value Date/Time   INR 1.2 08/22/2022 0833     Intake/Output Summary (Last 24 hours) at 09/30/2024 0809 Last data filed at 09/30/2024 0600 Gross per 24 hour  Intake 1418.18 ml  Output 1375 ml  Net 43.18 ml      Assessment/Plan:  68 y.o. male is s/p: #1: Right axillary bifemoral bypass graft                       #2: Bilateral common femoral, superficial femoral, and profundofemoral thrombectomy                       #3: Redo left common femoral artery exposure                       #4: 4 compartment right leg fasciotomy                        #5: Resection of necrotic muscle in the right anterior compartment                       #6: Bilateral femoral endarterectomy   With same-day takeback for right popliteal artery exposure, right popliteal thrombectomy, right tibial thrombectomy   Also s/p: Right lateral calf fasciotomy washout, debridement of skin and soft tissue, and wound VAC placement   -He has no complaints this morning. He says his pain in the right leg is about the same -His right lateral calf has a wound vac with good seal -RLE remains well perfused. His toes have brisk cap refill. His right foot remains numb without motor -Will plan on return to OR tomorrow with Dr.Robins for right lateral leg wound washout and wound vac change -Will make NPO at midnight and place  consent orders   Ahmed Holster, PA-C [facility] [phone] 09/30/2024 8:09 AM   I agree with the above.  Appreciate Dr. Crist assistance.  To OR with Dr. Lanis tomorrow for vac change  [name]

## 2024-09-30 NOTE — Progress Notes (Addendum)
 Occupational Therapy Treatment Patient Details Name: David King MRN: [id number] DOB: 02/04/1956 Today's Date: 09/30/2024   History of present illness 68 y.o. 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 Pt went to OR x2 on 9/26 initially for R axial bypass, bilateral femoral thrombectomy and RLE fasciotomy and later for redo RLE thrombectomy. Pt recently underwent L4-5 decompression and fusion on 9/10 with anaphylactic reaction during surgery requiring ICU stay. PMHx: COPD, CAD, PAD, tobacco use, lumbar radiculopathy, detached retina L eye   OT comments  Pt progressing well towards goals. Progressed to complete bed mobility with supervision. Pt with good problem solving skills with cues to figure 4 legs, but unable to sustain long enough to don socks d/t pain. Pt eager to progress OOB activity. Pt scooting well EOB, unable to transfer to chair d/t becoming orthostatic and symptomatic, requiring to return to supine. See additional progress note for further details.  Continue to recommend >3 hours of skilled rehab daily to optimize independence levels. Will continue to follow acutely.       If plan is discharge home, recommend the following:  A lot of help with walking and/or transfers;A lot of help with bathing/dressing/bathroom;Assistance with cooking/housework;Assist for transportation   Equipment Recommendations  None recommended by OT    Recommendations for Other Services Rehab consult    Precautions / Restrictions Precautions Precautions: Fall;Back;Other (comment) Precaution Booklet Issued: Yes (comment) Recall of Precautions/Restrictions: Intact Precaution/Restrictions Comments: watch BP (orthostatic); RLE foot drop Required Braces or Orthoses: Spinal Brace Spinal Brace: Thoracolumbosacral orthotic;Applied in sitting position Restrictions Weight Bearing  Restrictions Per Provider Order: No       Mobility Bed Mobility Overal bed mobility: Needs Assistance Bed Mobility: Rolling, Sidelying to Sit, Sit to Sidelying Rolling: Supervision Sidelying to sit: Supervision     Sit to sidelying: Supervision General bed mobility comments: S for safety and line management    Transfers Overall transfer level: Needs assistance Equipment used: None     General transfer comment: lateral scoots completed on EOB with CGA. Eager to attempt OOB transfer, but unable to d/t orthostatic and symptomatic     Balance Overall balance assessment: Needs assistance Sitting-balance support: Feet supported, Single extremity supported Sitting balance-Leahy Scale: Fair         ADL either performed or assessed with clinical judgement   ADL Overall ADL's : Needs assistance/impaired       Lower Body Dressing: Maximal assistance;Bed level Lower Body Dressing Details (indicate cue type and reason): Pt able to briefly figure 4 legs from supine but limited by pain in position       General ADL Comments: Pt limited d/t soft BP    Extremity/Trunk Assessment Upper Extremity Assessment Upper Extremity Assessment: Generalized weakness   Lower Extremity Assessment Lower Extremity Assessment: Defer to PT evaluation        Vision   Vision Assessment?: No apparent visual deficits Additional Comments: L detatched retina, largely [facility]         Communication Communication Communication: No apparent difficulties   Cognition Arousal: Alert Behavior During Therapy: WFL for tasks assessed/performed Cognition: No apparent impairments       Following commands: Intact        Cueing   Cueing Techniques: Verbal cues        General Comments Pt orthostatic and symptomatic, see additional note for measurements. RN and MD notified  Pertinent Vitals/ Pain       Pain Assessment Pain Assessment: Faces Faces Pain Scale: Hurts little more Pain Location:  RLE Pain Descriptors / Indicators: Discomfort Pain Intervention(s): Monitored during session   Frequency  Min 2X/week        Progress Toward Goals  OT Goals(current goals can now be found in the care plan section)  Progress towards OT goals: Progressing toward goals  Acute Rehab OT Goals Patient Stated Goal: To get better OT Goal Formulation: With patient Time For Goal Achievement: 10/14/24 Potential to Achieve Goals: Good ADL Goals Pt Will Perform Lower Body Bathing: sitting/lateral leans;with set-up;with adaptive equipment Pt Will Perform Lower Body Dressing: with mod assist;sit to/from stand Pt Will Transfer to Toilet: with mod assist;stand pivot transfer;bedside commode Pt/caregiver will Perform Home Exercise Program: Increased ROM;Both right and left upper extremity;Increased strength;With written HEP provided Additional ADL Goal #1: Pt will sit EOB independently for 3 meals/day as a precursor to OOB ADLs  Plan         AM-PAC OT 6 Clicks Daily Activity     Outcome Measure   Help from another person eating meals?: None Help from another person taking care of personal grooming?: A Little Help from another person toileting, which includes using toliet, bedpan, or urinal?: Total Help from another person bathing (including washing, rinsing, drying)?: A Lot Help from another person to put on and taking off regular upper body clothing?: A Little Help from another person to put on and taking off regular lower body clothing?: A Lot 6 Click Score: 15    End of Session    OT Visit Diagnosis: Unsteadiness on feet (R26.81);Other abnormalities of gait and mobility (R26.89);Muscle weakness (generalized) (M62.81);Pain Pain - Right/Left: Right Pain - part of body: Ankle and joints of foot   Activity Tolerance Other (comment) (Limited d/t orthostatic)   Patient Left in bed;with call bell/phone within reach   Nurse Communication Mobility status;Precautions        Time:  8677-8643 OT Time Calculation (min): 34 min  Charges: OT General Charges $OT Visit: 1 Visit OT Treatments $Self Care/Home Management : 23-37 mins  Adrianne BROCKS, OT  Acute Rehabilitation Services Office [phone] Secure chat preferred   Adrianne GORMAN Savers 09/30/2024, 2:22 PM

## 2024-09-30 NOTE — Progress Notes (Signed)
 PT Cancellation Note  Patient Details Name: David King MRN: [id number] DOB: Aug 09, 1956   Cancelled Treatment:    Reason Eval/Treat Not Completed: Medical issues which prohibited therapy  Spoke with RN, pt getting hypotensive again, requests hold for PT visit.  Will continue to follow acutely.  Leontine Roads, PT, DPT [facility]  Rehabilitation Services Physical Therapist Office: [phone] Website: [email or web address]   Leontine GORMAN Roads 09/30/2024, 5:06 PM

## 2024-09-30 NOTE — Progress Notes (Signed)
 PHARMACY - ANTICOAGULATION CONSULT NOTE  Pharmacy Consult for Heparin  Indication:  arterial insufficiency / occlusion  Allergies  Allergen Reactions   Ancef  [Cefazolin ] Anaphylaxis    Patient Measurements: Height: 5' 9 (175.3 cm) Weight: 83.2 kg (183 lb 6.8 oz) IBW/kg (Calculated) : 70.7 HEPARIN  DW (KG): 83.2 Heparin  dosing weight = 83 kg  Vital Signs: Temp: 98.3 F (36.8 C) (10/01 2328) Temp Source: Oral (10/01 2328) BP: 114/89 (10/01 2328) Pulse Rate: 67 (10/01 2328)  Labs: Recent Labs    09/27/24 0349 09/28/24 9351 09/28/24 1808 09/29/24 0355 09/29/24 0948 09/30/24 0035  HGB 7.5* 7.3*  --  9.4*  --   --   HCT 23.8* 22.5*  --  27.9*  --   --   PLT 118* 126*  --  134*  --   --   HEPARINUNFRC <0.10* 0.14*   < > 0.32 0.34 0.38  CREATININE 1.26* 1.04  --  1.02  --   --    < > = values in this interval not displayed.    Estimated Creatinine Clearance: 67.4 mL/min (by C-G formula based on SCr of 1.02 mg/dL).  Assessment: 74 YOM admitted w/ severe vascular insufficiency who is tentatively scheduled for right axillary bifemoral bypass w/ possible right common femoral to popliteal bypass. No PTA AC noted through a search of his EMR. Of note, he had recent lumbar fusion on 09/08/2024. Consult for heparin  therapy received 09/22/24. Patient subsequently transferred from CIR to inpatient on 9/25.   Patient is s/p R calf lateral fasciotomy washout, debridement of skin and soft tissue on 9/29 and I&D on 10/1. Therapeutic heparin  to be resumed now per MD.  10/2 AM update:  Heparin  level continues to be therapeutic after re-start on 1900 units/hr of heparin   Goal of Therapy:  Heparin  level 0.3-0.5 units/ml Monitor platelets by anticoagulation protocol: Yes   Plan:  Cont heparin  1900 units/hr Daily CBC and heparin  level Monitor closely for bleeding  Lynwood Mckusick, PharmD, BCPS Clinical Pharmacist Phone: [phone]

## 2024-09-30 NOTE — Progress Notes (Signed)
 Patient evaluated again at his bedside this morning.  He appears comfortable and overall in good spirits.  His wound VAC is in place.  He continues to deny any pain in his back, and continues to report resolution of his preoperative left leg pain.  He states he does continue to have pain in his heel on the right, but he does feel that the pain involving his shin and the dorsum of his foot has improved.  We did discuss his upcoming additional interventions currently planned by the vascular team and Dr. Harden.  He did have questions about his lumbar brace, and I did let him know that he does need to wear his TLSO brace when he is upright for a total of 6 weeks from his surgery, for approximately 3 more weeks.  He does understand that although I have no active role in his current management related to his arterial thrombus and vascular bypass procedure, I will continue to follow his progress.  I did express to him that ultimately, an AFO brace will be needed to help with his gait.  All his questions were answered.  As noted, I will continue to follow his progress.

## 2024-10-01 ENCOUNTER — Encounter (HOSPITAL_COMMUNITY)
Admission: RE | Disposition: A | Discharge status: Rehabilitation Facility | Payer: Self-pay | Source: Other Acute Inpatient Hospital | Attending: Internal Medicine

## 2024-10-01 ENCOUNTER — Inpatient Hospital Stay (HOSPITAL_COMMUNITY)

## 2024-10-01 ENCOUNTER — Other Ambulatory Visit: Payer: Self-pay

## 2024-10-01 ENCOUNTER — Encounter (HOSPITAL_COMMUNITY): Payer: Self-pay | Admitting: Internal Medicine

## 2024-10-01 DIAGNOSIS — Z87891 Personal history of nicotine dependence: Secondary | ICD-10-CM

## 2024-10-01 DIAGNOSIS — I10 Essential (primary) hypertension: Secondary | ICD-10-CM

## 2024-10-01 DIAGNOSIS — I739 Peripheral vascular disease, unspecified: Secondary | ICD-10-CM | POA: Diagnosis not present

## 2024-10-01 DIAGNOSIS — S81801A Unspecified open wound, right lower leg, initial encounter: Secondary | ICD-10-CM | POA: Diagnosis not present

## 2024-10-01 DIAGNOSIS — I25110 Atherosclerotic heart disease of native coronary artery with unstable angina pectoris: Secondary | ICD-10-CM | POA: Diagnosis not present

## 2024-10-01 DIAGNOSIS — Z9889 Other specified postprocedural states: Secondary | ICD-10-CM | POA: Diagnosis not present

## 2024-10-01 HISTORY — PX: INCISION AND DRAINAGE OF WOUND: SHX1803

## 2024-10-01 HISTORY — PX: APPLICATION OF WOUND VAC: SHX5189

## 2024-10-01 HISTORY — PX: APPLICATION, SKIN SUBSTITUTE: SHX7530

## 2024-10-01 LAB — BASIC METABOLIC PANEL WITH GFR
Anion gap: 10 (ref 5–15)
BUN: 13 mg/dL (ref 8–23)
CO2: 25 mmol/L (ref 22–32)
Calcium: 8.5 mg/dL — ABNORMAL LOW (ref 8.9–10.3)
Chloride: 102 mmol/L (ref 98–111)
Creatinine, Ser: 1.07 mg/dL (ref 0.61–1.24)
GFR, Estimated: >60 mL/min (ref >60)
Glucose, Bld: 93 mg/dL (ref 70–99)
Potassium: 3.6 mmol/L (ref 3.5–5.1)
Sodium: 137 mmol/L (ref 135–145)

## 2024-10-01 LAB — GLUCOSE, CAPILLARY: Glucose-Capillary: 131 mg/dL — ABNORMAL HIGH (ref 70–99)

## 2024-10-01 LAB — CBC
HCT: 27.3 % — ABNORMAL LOW (ref 39.0–52.0)
Hemoglobin: 9.1 g/dL — ABNORMAL LOW (ref 13.0–17.0)
MCH: 31.1 pg (ref 26.0–34.0)
MCHC: 33.3 g/dL (ref 30.0–36.0)
MCV: 93.2 fL (ref 80.0–100.0)
Platelets: 153 K/uL (ref 150–400)
RBC: 2.93 MIL/uL — ABNORMAL LOW (ref 4.22–5.81)
RDW: 16 % — ABNORMAL HIGH (ref 11.5–15.5)
WBC: 8.7 K/uL (ref 4.0–10.5)
nRBC: 0 % (ref 0.0–0.2)

## 2024-10-01 LAB — HEPARIN LEVEL (UNFRACTIONATED): Heparin Unfractionated: 0.43 [IU]/mL (ref 0.30–0.70)

## 2024-10-01 SURGERY — IRRIGATION AND DEBRIDEMENT WOUND
Anesthesia: General | Site: Leg Lower | Laterality: Right

## 2024-10-01 MED ORDER — PROPOFOL 10 MG/ML IV BOLUS
INTRAVENOUS | Status: DC | PRN
Start: 2024-10-01 — End: 2024-10-01
  Administered 2024-10-01: 120 mg via INTRAVENOUS

## 2024-10-01 MED ORDER — THIAMINE MONONITRATE 100 MG PO TABS
100.0000 mg | ORAL_TABLET | Freq: Every day | ORAL | Status: DC
  Administered 2024-10-02 – 2024-10-11 (×9): 100 mg via ORAL
  Filled 2024-10-01 (×10): qty 1

## 2024-10-01 MED ORDER — VANCOMYCIN HCL 1000 MG IV SOLR
INTRAVENOUS | Status: DC | PRN
  Administered 2024-10-01: 1000 mg via INTRAVENOUS

## 2024-10-01 MED ORDER — JUVEN PO PACK
1.0000 | PACK | Freq: Two times a day (BID) | ORAL | Status: DC
  Administered 2024-10-02 – 2024-10-11 (×19): 1 via ORAL
  Filled 2024-10-01 (×17): qty 1

## 2024-10-01 MED ORDER — PROPOFOL 10 MG/ML IV BOLUS
INTRAVENOUS | Status: AC
  Filled 2024-10-01: qty 20

## 2024-10-01 MED ORDER — ONDANSETRON HCL 4 MG/2ML IJ SOLN
INTRAMUSCULAR | Status: DC | PRN
Start: 2024-10-01 — End: 2024-10-01
  Administered 2024-10-01: 4 mg via INTRAVENOUS

## 2024-10-01 MED ORDER — FENTANYL CITRATE (PF) 250 MCG/5ML IJ SOLN
INTRAMUSCULAR | Status: DC | PRN
  Administered 2024-10-01: 25 ug via INTRAVENOUS

## 2024-10-01 MED ORDER — FENTANYL CITRATE (PF) 250 MCG/5ML IJ SOLN
INTRAMUSCULAR | Status: AC
  Filled 2024-10-01: qty 5

## 2024-10-01 MED ORDER — VANCOMYCIN HCL IN DEXTROSE 1-5 GM/200ML-% IV SOLN: 1000.0000 mg | Freq: Once | INTRAVENOUS | Status: DC

## 2024-10-01 MED ORDER — CHLORHEXIDINE GLUCONATE 0.12 % MT SOLN
OROMUCOSAL | Status: AC
  Administered 2024-10-01: 15 mL via OROMUCOSAL
  Filled 2024-10-01: qty 15

## 2024-10-01 MED ORDER — EPHEDRINE SULFATE-NACL 50-0.9 MG/10ML-% IV SOSY
PREFILLED_SYRINGE | INTRAVENOUS | Status: DC | PRN
  Administered 2024-10-01 (×2): 10 mg via INTRAVENOUS

## 2024-10-01 MED ORDER — OXYCODONE HCL 5 MG PO TABS: 5.0000 mg | ORAL_TABLET | Freq: Once | ORAL | Status: DC | PRN

## 2024-10-01 MED ORDER — MIDAZOLAM HCL 2 MG/2ML IJ SOLN
INTRAMUSCULAR | Status: DC | PRN
  Administered 2024-10-01: 2 mg via INTRAVENOUS

## 2024-10-01 MED ORDER — PHENYLEPHRINE 80 MCG/ML (10ML) SYRINGE FOR IV PUSH (FOR BLOOD PRESSURE SUPPORT)
PREFILLED_SYRINGE | INTRAVENOUS | Status: DC | PRN
  Administered 2024-10-01 (×2): 80 ug via INTRAVENOUS
  Administered 2024-10-01: 160 ug via INTRAVENOUS

## 2024-10-01 MED ORDER — ORAL CARE MOUTH RINSE: 15.0000 mL | Freq: Once | OROMUCOSAL | Status: AC

## 2024-10-01 MED ORDER — LACTATED RINGERS IV SOLN: INTRAVENOUS | Status: DC

## 2024-10-01 MED ORDER — ADULT MULTIVITAMIN W/MINERALS CH
1.0000 | ORAL_TABLET | Freq: Every day | ORAL | Status: DC
Start: 2024-10-02 — End: 2024-10-11
  Administered 2024-10-02 – 2024-10-11 (×9): 1 via ORAL
  Filled 2024-10-01 (×9): qty 1

## 2024-10-01 MED ORDER — DROPERIDOL 2.5 MG/ML IJ SOLN: 0.6250 mg | Freq: Once | INTRAMUSCULAR | Status: DC | PRN

## 2024-10-01 MED ORDER — VANCOMYCIN HCL 1000 MG IV SOLR: INTRAVENOUS | Status: DC | PRN

## 2024-10-01 MED ORDER — HEPARIN (PORCINE) 25000 UT/250ML-% IV SOLN
1750.0000 [IU]/h | INTRAVENOUS | Status: DC
  Administered 2024-10-01 – 2024-10-03 (×5): 1900 [IU]/h via INTRAVENOUS
  Administered 2024-10-04 – 2024-10-05 (×2): 1800 [IU]/h via INTRAVENOUS
  Administered 2024-10-05 – 2024-10-06 (×2): 1750 [IU]/h via INTRAVENOUS
  Filled 2024-10-01 (×9): qty 250

## 2024-10-01 MED ORDER — OXYCODONE HCL 5 MG/5ML PO SOLN: 5.0000 mg | Freq: Once | ORAL | Status: DC | PRN

## 2024-10-01 MED ORDER — FENTANYL CITRATE (PF) 100 MCG/2ML IJ SOLN: 25.0000 ug | INTRAMUSCULAR | Status: DC | PRN

## 2024-10-01 MED ORDER — CHLORHEXIDINE GLUCONATE 0.12 % MT SOLN: 15.0000 mL | Freq: Once | OROMUCOSAL | Status: AC

## 2024-10-01 MED ORDER — LACTATED RINGERS IV SOLN: INTRAVENOUS | Status: DC | PRN

## 2024-10-01 MED ORDER — DEXAMETHASONE SODIUM PHOSPHATE 10 MG/ML IJ SOLN
INTRAMUSCULAR | Status: DC | PRN
  Administered 2024-10-01: 4 mg via INTRAVENOUS

## 2024-10-01 MED ORDER — ENSURE PLUS HIGH PROTEIN PO LIQD
237.0000 mL | Freq: Two times a day (BID) | ORAL | Status: DC
  Administered 2024-10-02 – 2024-10-11 (×18): 237 mL via ORAL

## 2024-10-01 MED ORDER — LIDOCAINE 2% (20 MG/ML) 5 ML SYRINGE
INTRAMUSCULAR | Status: DC | PRN
  Administered 2024-10-01: 100 mg via INTRAVENOUS

## 2024-10-01 MED ORDER — ACETAMINOPHEN 10 MG/ML IV SOLN: 1000.0000 mg | Freq: Once | INTRAVENOUS | Status: DC | PRN

## 2024-10-01 MED ORDER — 0.9 % SODIUM CHLORIDE (POUR BTL) OPTIME
TOPICAL | Status: DC | PRN
  Administered 2024-10-01: 1000 mL

## 2024-10-01 MED ORDER — MIDAZOLAM HCL 2 MG/2ML IJ SOLN
INTRAMUSCULAR | Status: AC
Start: 2024-10-01 — End: 2024-10-01
  Filled 2024-10-01: qty 2

## 2024-10-01 MED ORDER — VANCOMYCIN HCL IN DEXTROSE 1-5 GM/200ML-% IV SOLN
INTRAVENOUS | Status: AC
  Filled 2024-10-01: qty 200

## 2024-10-01 MED ORDER — ONDANSETRON HCL 4 MG/2ML IJ SOLN: 4.0000 mg | Freq: Once | INTRAMUSCULAR | Status: DC | PRN

## 2024-10-01 SURGICAL SUPPLY — 22 items
CANISTER SUCTION 3000ML PPV (SUCTIONS) ×2 IMPLANT
CANISTER WOUND CARE 500ML ATS (WOUND CARE) IMPLANT
COVER SURGICAL LIGHT HANDLE (MISCELLANEOUS) ×2 IMPLANT
DRAPE HALF SHEET 40X57 (DRAPES) IMPLANT
DRAPE SURG ORHT 6 SPLT 77X108 (DRAPES) IMPLANT
DRAPE U-SHAPE 76X120 STRL (DRAPES) IMPLANT
DRSG CLEANSE VERAFLOW (GAUZE/BANDAGES/DRESSINGS) IMPLANT
ELECTRODE REM PT RTRN 9FT ADLT (ELECTROSURGICAL) ×2 IMPLANT
GLOVE BIOGEL PI IND STRL 8 (GLOVE) ×2 IMPLANT
GOWN STRL REUS W/ TWL LRG LVL3 (GOWN DISPOSABLE) ×4 IMPLANT
GOWN STRL REUS W/TWL 2XL LVL3 (GOWN DISPOSABLE) ×4 IMPLANT
GRAFT SKIN WND SURGICLOSE M95 (Tissue) IMPLANT
KIT BASIN OR (CUSTOM PROCEDURE TRAY) ×2 IMPLANT
KIT TURNOVER KIT B (KITS) ×2 IMPLANT
PACK GENERAL/GYN (CUSTOM PROCEDURE TRAY) IMPLANT
PAD ARMBOARD POSITIONER FOAM (MISCELLANEOUS) ×4 IMPLANT
PAD NEG PRESSURE SENSATRAC (MISCELLANEOUS) IMPLANT
SOLN STERILE WATER 1000 ML (IV SOLUTION) ×1 IMPLANT
SOLN STERILE WATER BTL 1000 ML (IV SOLUTION) ×2 IMPLANT
STAPLER SKIN PROX 35W (STAPLE) IMPLANT
SYR 10ML LL (SYRINGE) IMPLANT
TOWEL GREEN STERILE (TOWEL DISPOSABLE) ×2 IMPLANT

## 2024-10-01 NOTE — Op Note (Signed)
    NAME: David King    MRN: [id number] DOB: 07-Jul-1956    DATE OF OPERATION: 10/01/2024  PREOP DIAGNOSIS:    Right leg open wound   POSTOP DIAGNOSIS:    Same  PROCEDURE:    Right leg fasciotomy washout Soft tissue debridement 95 cm Kerecis biologic micrograft Application cleanse choice wound VAC sponges x 2.  SURGEON: Fonda FORBES Rim  ASSIST: Curry Damme  ANESTHESIA: General   EBL: 5ml  INDICATIONS:    David King is a 68 y.o. male  with prolonged hospital stay currently with recent axillobifemoral bypass graft, for acute aortic occlusion. There was necrosis of the anterior compartment. This was all resected. David King presents today for right leg lateral fasciotomy washout, wound VAC placement, biologic placement.  FINDINGS:   Healthy muscle rotated into the anterior compartment from the lateral compartment.  There was less depth, with granulation tissue at the wound bed.  TECHNIQUE:   Patient was brought to the OR laid in supine position.  General anesthesia was used and the patient was prepped draped in standard fashion.  A timeout was called.  The case began with removal of the previous wound VAC dressing and incision.  The wound bed was irrigated with copious amounts of saline.  A small amount of devitalized tissue was debrided.  I elected to leave the devitalized dermis in place as this was used to hold suture and not to damage healthy dermis.  There appeared to be granulating tissue around the anterior tibial neurovascular bundle.  No bleeding concerns.  I then placed another 95 cm of Kerecis over the exposed tibia and over the anterior tibial artery vessels.  A new vacuum dressing was placed.  The wound dermis was reapproximated using 2-0 nylon to hold the sponges in place, followed by derma tack.  This had good suction, and the patient was taken the PACU in stable condition.    Fonda FORBES Rim, MD [facility] DATE OF DICTATION:    10/01/2024

## 2024-10-01 NOTE — Progress Notes (Signed)
 [facility]  Plan for patient to return to the OR with DR. Lanis today for repeat I & D with excisional debridement.   Will plan for DR. Harden to take him back Wed. 10/06/24.   Maurilio Deland Collet PA-C

## 2024-10-01 NOTE — Progress Notes (Signed)
 PT Cancellation Note  Patient Details Name: David King MRN: [id number] DOB: [birth year]   Cancelled Treatment:    Reason Eval/Treat Not Completed: (P) Patient at procedure or test/unavailable (Pt off unit for RLE I&D and wound vac change.) Will continue efforts per PT plan of care as schedule permits.   [name] [name] 10/01/2024, 11:02 AM

## 2024-10-01 NOTE — Progress Notes (Signed)
 PHARMACY - ANTICOAGULATION CONSULT NOTE  Pharmacy Consult for Heparin  Indication:  arterial insufficiency / occlusion  Allergies  Allergen Reactions   Ancef  [Cefazolin ] Anaphylaxis    Patient Measurements: Height: 5' 9 (175.3 cm) Weight: 83.2 kg (183 lb 6.8 oz) IBW/kg (Calculated) : 70.7 HEPARIN  DW (KG): 83.2 Heparin  dosing weight = 83 kg  Vital Signs: Temp: 97.8 F (36.6 C) (10/03 1512) Temp Source: Oral (10/03 1512) BP: 135/65 (10/03 1520) Pulse Rate: 78 (10/03 1445)  Labs: Recent Labs    09/30/24 0035 09/30/24 0505 09/30/24 1531 10/01/24 0510  HGB  --  9.5* 9.9* 9.1*  HCT  --  29.0* 30.3* 27.3*  PLT  --  150 150 153  HEPARINUNFRC 0.38 0.38  --  0.43  CREATININE  --  1.09 1.21 1.07    Estimated Creatinine Clearance: 64.2 mL/min (by C-G formula based on SCr of 1.07 mg/dL).  Assessment: 64 YOM admitted w/ severe vascular insufficiency who is tentatively scheduled for right axillary bifemoral bypass w/ possible right common femoral to popliteal bypass. No PTA AC noted through a search of his EMR. Of note, he had recent lumbar fusion on 09/08/2024. Consult for heparin  therapy received 09/22/24. Patient subsequently transferred from CIR to inpatient on 9/25.   Patient is s/p R calf lateral fasciotomy washout, debridement of skin and soft tissue on 9/29 and I&D on 10/1.  Heparin  level 0.43 was therapeutic on 10/3 AM on heparin  1900 unit/hr  No issues with heparin  infusion or s/sx bleeding reported. Heparin  infusion was stopped at 10:22 today prior to surgery >> Now patient returns from surgery, s/p  I& D RLE wound, VAC application on 10/3.  I contacted David Sender, PA-C  [facility] who has discussed heparin  plan with surgeon and gave order to restart Heparin  infusion now, no bolus.    Goal of Therapy:  Heparin  level 0.3-0.5 units/ml Monitor platelets by anticoagulation protocol: Yes   Plan:  Restart Heparin  at 1900 units/hr now. No bolus Check heparin  level in 8  hours Check heparin  level daily while on heparin  Continue to monitor H&H and platelets  Thank you for allowing pharmacy to be a part of this patient's care.  David King, RPh Clinical Pharmacist

## 2024-10-01 NOTE — Progress Notes (Signed)
  Progress Note    10/01/2024 12:39 PM * Day of Surgery *  Subjective:  no complaints    Vitals:   10/01/24 0922 10/01/24 1038  BP: 129/70 (!) (P) 125/59  Pulse:  (P) 71  Resp:  (P) 18  Temp:  (P) 97.9 F (36.6 C)  SpO2:  (P) 92%    Physical Exam: General:  sitting up in bed, NAD  Cardiac:  regular Lungs:  non-labored Incisions:  all incisions healing appropriately. Right lateral leg wound with wound vac with good seal Extremities:  right toes warm to touch, no sensation or motor   CBC    Component Value Date/Time   WBC 8.7 10/01/2024 0510   RBC 2.93 (L) 10/01/2024 0510   HGB 9.1 (L) 10/01/2024 0510   HCT 27.3 (L) 10/01/2024 0510   PLT 153 10/01/2024 0510   MCV 93.2 10/01/2024 0510   MCH 31.1 10/01/2024 0510   MCHC 33.3 10/01/2024 0510   RDW 16.0 (H) 10/01/2024 0510   LYMPHSABS 1.1 09/29/2024 0355   MONOABS 0.5 09/29/2024 0355   EOSABS 0.2 09/29/2024 0355   BASOSABS 0.0 09/29/2024 0355    BMET    Component Value Date/Time   NA 137 10/01/2024 0510   K 3.6 10/01/2024 0510   CL 102 10/01/2024 0510   CO2 25 10/01/2024 0510   GLUCOSE 93 10/01/2024 0510   BUN 13 10/01/2024 0510   CREATININE 1.07 10/01/2024 0510   CREATININE 1.06 10/10/2020 0915   CALCIUM  8.5 (L) 10/01/2024 0510   GFRNONAA >60 10/01/2024 0510   GFRNONAA 73 10/10/2020 0915   GFRAA 84 10/10/2020 0915    INR    Component Value Date/Time   INR 1.2 08/22/2022 0833     Intake/Output Summary (Last 24 hours) at 10/01/2024 1239 Last data filed at 10/01/2024 9070 Gross per 24 hour  Intake 2136.2 ml  Output 2020 ml  Net 116.2 ml      Assessment/Plan:  68 y.o. male is s/p: #1: Right axillary bifemoral bypass graft                       #2: Bilateral common femoral, superficial femoral, and profundofemoral thrombectomy                       #3: Redo left common femoral artery exposure                       #4: 4 compartment right leg fasciotomy                       #5: Resection of  necrotic muscle in the right anterior compartment                       #6: Bilateral femoral endarterectomy   With same-day takeback for right popliteal artery exposure, right popliteal thrombectomy, right tibial thrombectomy   Also s/p: Right lateral calf fasciotomy washout, debridement of skin and soft tissue, and wound VAC placement   Plan for right lateral leg wound washout, debridement, biologic dressing placement today.  We are continuing to monitor to the right heel, which demonstrates some amount of dermal necrosis.  Plan to manage this conservatively with offloading.  I do not think operative debridement would provide significant benefit.  After discussing the risks and benefits the above, Jaicob elected to proceed.

## 2024-10-01 NOTE — Anesthesia Preprocedure Evaluation (Addendum)
 Anesthesia Evaluation  Patient identified by MRN, date of birth, ID band Patient awake    Reviewed: Allergy  & Precautions, NPO status , Patient's Chart, lab work & pertinent test results  History of Anesthesia Complications Negative for: history of anesthetic complications  Airway Mallampati: II       Dental  (+) Teeth Intact, Dental Advisory Given   Pulmonary COPD, former smoker   breath sounds clear to auscultation       Cardiovascular hypertension, + angina  + CAD and + Peripheral Vascular Disease   Rhythm:Regular Rate:Normal     Neuro/Psych    GI/Hepatic ,GERD  ,,  Endo/Other  neg diabetes    Renal/GU Renal disease     Musculoskeletal  (+) Arthritis ,    Abdominal   Peds  Hematology   Anesthesia Other Findings   Reproductive/Obstetrics                              Anesthesia Physical Anesthesia Plan  ASA: 3  Anesthesia Plan: General   Post-op Pain Management:    Induction: Intravenous  PONV Risk Score and Plan: 2  Airway Management Planned: Oral ETT and LMA  Additional Equipment:   Intra-op Plan:   Post-operative Plan: Extubation in OR  Informed Consent:      Dental advisory given  Plan Discussed with: CRNA and Surgeon  Anesthesia Plan Comments: (68 y.o. male with PMH significant for HTN, HLD, PAD s/p b/l common [place] artery stenting, prostate cancer, COPD, diverticulosis, chronic back pain, lumbar radiculopathy - who presents to OR for washout s/p fasciotomy. Hgb 9.1, Plts 150s. Type and Screen active. Plan for GA via LMA. )         Anesthesia Quick Evaluation

## 2024-10-01 NOTE — Transfer of Care (Signed)
 Immediate Anesthesia Transfer of Care Note  Patient: David King  Procedure(s) Performed: IRRIGATION AND DEBRIDEMENT WOUND (Right: Leg Lower) APPLICATION, SKIN SUBSTITUTE KERECIS 95SQ CM (Right: Leg Lower) APPLICATION, WOUND VAC (Right: Leg Lower)  Patient Location: PACU  Anesthesia Type:General  Level of Consciousness: awake, alert , and oriented  Airway & Oxygen  Therapy: Patient Spontanous Breathing and Patient connected to nasal cannula oxygen   Post-op Assessment: Report given to RN and Post -op Vital signs reviewed and stable  Post vital signs: Reviewed and stable  Last Vitals:  Vitals Value Taken Time  BP 102/78 10/01/24 14:08  Temp    Pulse 80 10/01/24 14:11  Resp 13 10/01/24 14:11  SpO2 93 % 10/01/24 14:11  Vitals shown include unfiled device data.  Last Pain:  Vitals:   10/01/24 1057  TempSrc:   PainSc: 5       Patients Stated Pain Goal: 0 (09/26/24 1411)  Complications: No notable events documented.

## 2024-10-01 NOTE — Progress Notes (Addendum)
 Initial Nutrition Assessment  DOCUMENTATION CODES:   Severe malnutrition in context of acute illness/injury  INTERVENTION:  Recommend regular diet once back from OR  Once diet advanced, recommend:  Ensure Plus High Protein po BID, each supplement provides 350 kcal and 20 grams of protein Mighty Shake TID with meals, each supplement provides 330 kcals and 9 grams of protein shake 1 packet Juven BID, each packet provides 95 calories, 2.5 grams of protein (collagen), to support wound healing MVI with minrerals daily 100 mg Thiamine daily Check Vitamin A, Vitamin C , Vitamin D, Vitamin E, CRP, and Zinc  for deficiencies    NUTRITION DIAGNOSIS:   Severe Malnutrition related to acute illness as evidenced by moderate muscle depletion, mild fat depletion, energy intake < or equal to 50% for > or equal to 5 days.  GOAL:   Patient will meet greater than or equal to 90% of their needs  MONITOR:   PO intake, Diet advancement, Supplement acceptance, Skin, Labs  REASON FOR ASSESSMENT:   LOS, NPO/Clear Liquid Diet    ASSESSMENT:  68 y.o. male with prolonged hospital stay coming from CIR for aortic occulusion s/p  R axillobifemoral bypass graft.  PMH significant for HTN, HLD, PAD s/p b/l common Ilac artery stenting, prostate cancer, COPD, diverticulosis, chronic back pain, lumbar radiculopathy, s/p L4-5 decompression and fusion, detached retina L eye.  9/10 - L4-5 decompression and fusion, had anaphylactic reaction during surgery requiring ICU stay, short term intubation 9/17 - Transferred to CIR  9/26 - Admitted from CIR, s/p R axial bypass, bilateral femoral thrombectomy and RLE fasciotomy and later for redo RLE thrombectomy.  9/29 - underwent right calf lateral fasciotomy washout, skin debridement and wound VAC placement.  10/3 - repeat I & D with excisional debridement., wound vac placement on right leg   Pt sleepy on visit, just woke up. A little agitated that he was woken up. Waiting  on surgery today, NPO. Pt reports his appetite has decreased since being in the hospital, only eating bites-25%. Pt has also been NPO multiple times for surgeries/OR, during admission and has not been on a diet very long to have proper nutrition. Pt initially admitted 09/08/2024. He states he was 198 lbs PTA. On admission pt noted to be 205 lbs, if this is accurate pt has lost 22 lbs, 11% in 1 month.   At baseline pt only eats 2-3 meals per day consisting of coffee and fruit at breakfast and then a sandwich for lunch, and a meat vegetable and side for dinner. Pt reports he was trying to lose wight before coming to the hospital. Discussed with pt why this is not recommended in a hospital setting, post op, with wounds. Pt agreeable.   Pt with multiple wounds  and increased healing from multiple surgeries/OR procedures, has signs of severe acute malnutrition such as poor po intake, 11% weight loss in 1 month, and moderate muscle and mild fat depletions. Recommend regular diet when back from OR. Encouraged increased po intake with emphasis on protein. Pt agreeable to trying Ensure, Mighty shakes, Juven. May need nutrition support if continued poor po as pt is at high nutritional risk.    Intake/Output Summary (Last 24 hours) at 10/01/2024 1537 Last data filed at 10/01/2024 1356 Gross per 24 hour  Intake 2466.47 ml  Output 1340 ml  Net 1126.47 ml   Drains/Lines:  R wound vac: 170 ml x 24 hours   Admit weight: 93.3 kg 205 lbs  Current weight: 83.2 kg 183 lbs  *  On admission pt noted to be 205 lbs, if this is accurate pt has lost 22 lbs, 11% in 1 month.    Average Meal Intake: 9/27-10/2: 20% intake x 8 recorded meals  Nutritionally Relevant Medications: Scheduled Meds:  aspirin  EC  81 mg Oral Daily   citalopram   20 mg Oral Daily   docusate sodium   100 mg Oral BID   DULoxetine   30 mg Oral QHS   midodrine  5 mg Oral TID WC   oxyCODONE   10 mg Oral Q12H   pregabalin   150 mg Oral TID   rosuvastatin    20 mg Oral Daily   tamsulosin   0.4 mg Oral QPC supper   Continuous Infusions:  sodium chloride  999 mL/hr at 09/30/24 1527   heparin  Stopped (10/01/24 1022)   vancomycin      Labs Reviewed: CBG ranges from 93-116 mg/dL over the last 24 hours HgbA1c 5.2   NUTRITION - FOCUSED PHYSICAL EXAM:  Flowsheet Row Most Recent Value  Orbital Region Mild depletion  Upper Arm Region No depletion  Thoracic and Lumbar Region No depletion  Buccal Region Mild depletion  Temple Region Moderate depletion  Clavicle Bone Region Moderate depletion  Clavicle and Acromion Bone Region Moderate depletion  Scapular Bone Region Mild depletion  Dorsal Hand Moderate depletion  Patellar Region Moderate depletion  Anterior Thigh Region Moderate depletion  Posterior Calf Region Moderate depletion  [R calf unable to access]  Edema (RD Assessment) None  Hair Reviewed  Eyes Reviewed  Mouth Reviewed  [Missing teeth]  Skin Reviewed  [right leg yellow?]  Nails Reviewed  [Right toenails yellow]    Diet Order:   Diet Order     None       EDUCATION NEEDS:   Education needs have been addressed  Skin:  Skin Assessment: Skin Integrity Issues: Skin Integrity Issues:: Wound VAC, DTI, Incisions, Other (Comment) DTI: Right buttocks, Right heel Wound Vac: Right leg Incisions: Closed surgical incision groin, chest, back, right leg Other: R toe wound  Last BM:  10/1  Height:   Ht Readings from Last 1 Encounters:  10/01/24 5' 9 (1.753 m)    Weight:   Wt Readings from Last 1 Encounters:  10/01/24 83.2 kg    Ideal Body Weight:  72.7 kg  BMI:  Body mass index is 27.09 kg/m.  Estimated Nutritional Needs:   Kcal:  2200-2400 kcal  Protein:  130-150 gm  Fluid:  >2L/day   Olivia Kenning, RD Registered Dietitian  See Amion for more information

## 2024-10-01 NOTE — Anesthesia Procedure Notes (Signed)
 Procedure Name: LMA Insertion Date/Time: 10/01/2024 1:14 PM  Performed by: Lockie Flesher, CRNAPre-anesthesia Checklist: Patient identified, Emergency Drugs available, Suction available and Patient being monitored Patient Re-evaluated:Patient Re-evaluated prior to induction Oxygen  Delivery Method: Circle System Utilized Preoxygenation: Pre-oxygenation with 100% oxygen  Induction Type: IV induction Ventilation: Mask ventilation without difficulty LMA: LMA inserted LMA Size: 4.0 Number of attempts: 1 Airway Equipment and Method: Bite block Placement Confirmation: positive ETCO2 Tube secured with: Tape Dental Injury: Teeth and Oropharynx as per pre-operative assessment

## 2024-10-01 NOTE — Progress Notes (Signed)
 Inpatient Rehab Admissions Coordinator:  Pt not medically ready for CIR. Pt with pending surgery on Wednesday, October 8th. Will continue to follow.   Tinnie Yvone Cohens, MS, CCC-SLP Admissions Coordinator [phone]

## 2024-10-01 NOTE — Progress Notes (Signed)
 PROGRESS NOTE    David King  FMW:[id number] DOB: 06/10/1956 DOA: 09/24/2024 PCP: [name], MD    Brief Narrative: 68 y.o. male with PMH significant for HTN, HLD, PAD s/p b/l common Ilac artery stenting, prostate cancer, COPD, diverticulosis, chronic back pain, lumbar radiculopathy, left eye with times detachment. Because of progressive radiculopathy pain, patient was following up with orthopedics as an outpatient.  He had previous L4-L5 disc decompression.  Recent MRI showed severe stenosis of the left L4-L5 due to large disc herniation.   -- 09/08/2024, patient underwent revision L4-L5 decompression, lumbar body fusion by Dr. Beuford.  During the procedure, patient had an anaphylactic reaction and developed hives with some hypotension. He required short-term intubation and transferred to ICU, post recovery he was noted to have some improvement in his left leg pain, postop site looked okay but he developed some right lower extremity weakness.  Neurology was consulted.  MRI brain was unremarkable.  EMG showed nonspecific changes.  -- 9/17, discharged to CIR. 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 -- 9/26, patient was readmitted to acute care with a plan of revascularization.  Patient underwent right axillary femoral bypass graft, bilateral common femoral, superficial femoral and profundofemoral thrombectomy, right leg anterior compartment fasciotomy due to compartment syndrome, right femoral endarterectomy. Soon after coming out of the OR, he was found to have decrease in his right lower extremity pulses again. Seen by vascular surgery, taken to the OR again.  Underwent right leg angiogram, found to have thrombus in the popliteal artery, underwent successful  thrombectomy. Postop, admitted to [facility] -- 9/29, underwent right calf lateral fasciotomy washout, skin debridement and wound VAC placement.    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 9/30 right calf lateral fasciotomy without any skin debridement and wound VAC placement Currently on heparin  drip, aspirin  81 mg daily, statin Vascular surgery following Per vascular note, bilateral lower extremities are well-perfused with brisk PT Doppler signals..  But right foot with no motor or sensory function.   09/29/2024 Dr. Harden S/P  Excisional debridement skin soft tissue muscle and fascia anterior compartment right leg.  Transfer of the lateral compartment muscles to the anterior compartment to cover the anterior tibial vessels.  Repeat debridement 10/01/2024 with vascular status post I&D and excisional debridement And Dr. Harden to do serial debridement next Wednesday 10/8    Lumbar radiculopathy S/p recent L4-L5 decompression/fusion -9/10 Dr Beuford Continue morphine  and OxyContin  and oxycodone  Lyrica    Acute blood loss anemia-hemoglobin dropped as low as 6.8 status post 1 unit of packed RBC now stable at 9.5  AKI Resolved  with IV hydration.    Acute urinary retention Foul-smelling urine H/o prostate cancer he had Foley catheter inserted on admission.  This was removed on 27 September.  Since then he required multiple  In-N-Out  catheterization.  With routine reminders he is able to urinate without having a Foley in place.  Continue Flomax .   Hyponatremia Mild.  Continue to monitor.improving   Hyperlipidemia Crestor  20 mg daily   COPD Bronchodilators as needed   Diverticulosis Encourage regular bowel regimen Currently on Colace 100 mg twice daily.   Anxiety/depression Celexa  20 mg daily, Cymbalta  nightly, Ambien  nightly as needed  Wound 09/15/24 2032 Pressure Injury Buttocks Bilateral;Right Deep Tissue Pressure Injury - Purple or maroon localized area of discolored intact skin or blood-filled blister due to damage of underlying soft tissue from pressure and/or shear. (Active)     Wound 09/15/24 1848 Pressure Injury Heel Right Deep Tissue Pressure Injury - Purple or maroon localized area of discolored intact skin or blood-filled blister due to damage of underlying soft tissue from pressure and/or shear. (Active)     Estimated body mass index is 27.09 kg/m as calculated from the following:   Height as of this encounter: 5' 9 (1.753 m).   Weight as of this encounter: 83.2 kg.  DVT prophylaxis: heparin  Code Status: full Family Communication: none Disposition Plan:  Status is: Inpatient Remains inpatient appropriate because:    Consultants: ortho vascular   Procedures: see above Antimicrobials:none  Subjective: He is complaining of some difficulty breathing today when asked how long has it been going on he said 2 weeks he is having some dry cough denies chest pain nausea vomiting 8 out of 10 pain to his right lower extremity  Objective: Vitals:   10/01/24 1430 10/01/24 1445 10/01/24 1512 10/01/24 1520  BP: (!) 125/90 137/70 (!) 123/50 135/65  Pulse: 79 78    Resp: 20 19  13   Temp:  97.7 F (36.5 C) 97.8 F (36.6 C)   TempSrc:   Oral   SpO2: 93% 93%    Weight:      Height:        Intake/Output Summary (Last 24 hours) at 10/01/2024 1602 Last data filed at 10/01/2024 1356 Gross per 24 hour   Intake 2466.47 ml  Output 1340 ml  Net 1126.47 ml   Filed Weights   09/29/24 1049 10/01/24 1038  Weight: 83.2 kg 83.2 kg    Examination:  General exam: Appears in nad Respiratory system: Clear to auscultation. Respiratory effort normal. Cardiovascular system:reg Gastrointestinal system: Abdomen is nondistended, soft and nontender. No organomegaly or masses felt. Normal bowel sounds heard. Central nervous system: Alert and oriented. No focal neurological deficits. Extremities: rle with vac   Data Reviewed: I have personally reviewed following labs and imaging studies  CBC: Recent Labs  Lab 09/25/24 0500 09/26/24 0257 09/26/24 0852 09/27/24 0349 09/28/24 0648 09/29/24 0355 09/30/24 0505 09/30/24 1531 10/01/24 0510  WBC 11.0* 9.5  --  6.4 5.3 5.1 6.0 8.7 8.7  NEUTROABS 9.3* 7.6  --  4.9 4.2 3.1  --   --   --   HGB 7.5* 6.8*   < > 7.5* 7.3* 9.4* 9.5* 9.9* 9.1*  HCT 21.8* 20.5*   < > 23.8* 22.5* 27.9* 29.0* 30.3* 27.3*  MCV 97.8 100.0  --  100.4* 95.7 92.4 92.7 93.5 93.2  PLT 165 137*  --  118* 126* 134* 150 150 153   < > = values in this interval not displayed.   Basic Metabolic Panel: Recent Labs  Lab 09/25/24 0500 09/26/24 0257 09/27/24 0349 09/28/24 0648 09/29/24 0355 09/30/24 0505 09/30/24 1531 10/01/24 0510  NA 131* 133* 131* 131* 133* 132* 135 137  K 4.2  4.1 3.9 4.4 3.7 4.0 3.7 3.6  CL 102 99 103 98 102 99 101 102  CO2 23 25 23 23 25 24 22 25   GLUCOSE 111* 109* 94 114* 96 105* 116* 93  BUN 10 11 12 15 16 13 16 13   CREATININE 0.92 1.28* 1.26* 1.04 1.02 1.09 1.21 1.07  CALCIUM  7.4* 7.6* 7.4* 7.6* 7.8* 8.3* 8.2* 8.5*  MG 2.2 2.4 2.3 2.1  --   --   --   --   PHOS 3.7 2.9 3.4 3.6  --   --   --   --    GFR: Estimated Creatinine Clearance: 64.2 mL/min (by C-G formula based on SCr of 1.07 mg/dL). Liver Function Tests: Recent Labs  Lab 09/26/24 0257 09/27/24 0349 09/28/24 0648 09/29/24 0355 09/30/24 1531  AST 31 40 51* 59* 45*  ALT 39 40 56* 70*  64*  ALKPHOS 50 61 83 93 90  BILITOT 0.9 1.0 1.0 1.1 0.8  PROT 5.1* 4.9* 5.1* 5.4* 5.7*  ALBUMIN  2.4* 2.1* 2.2* 2.3* 2.5*   No results for input(s): LIPASE, AMYLASE in the last 168 hours. No results for input(s): AMMONIA in the last 168 hours. Coagulation Profile: No results for input(s): INR, PROTIME in the last 168 hours. Cardiac Enzymes: No results for input(s): CKTOTAL, CKMB, CKMBINDEX, TROPONINI in the last 168 hours.  BNP (last 3 results) No results for input(s): PROBNP in the last 8760 hours. HbA1C: No results for input(s): HGBA1C in the last 72 hours. CBG: No results for input(s): GLUCAP in the last 168 hours. Lipid Profile: No results for input(s): CHOL, HDL, LDLCALC, TRIG, CHOLHDL, LDLDIRECT in the last 72 hours. Thyroid Function Tests: No results for input(s): TSH, T4TOTAL, FREET4, T3FREE, THYROIDAB in the last 72 hours. Anemia Panel: No results for input(s): VITAMINB12, FOLATE, FERRITIN, TIBC, IRON, RETICCTPCT in the last 72 hours. Sepsis Labs: No results for input(s): PROCALCITON, LATICACIDVEN in the last 168 hours.  No results found for this or any previous visit (from the past 240 hours).    Radiology Studies: DG CHEST PORT 1 VIEW Result Date: 09/30/2024 CLINICAL DATA:  Shortness of breath for the past 2 days.  Ex-smoker. EXAM: PORTABLE CHEST 1 VIEW COMPARISON:  09/08/2024 FINDINGS: The endotracheal tube has been removed. Normal-sized heart. Tortuous and partially calcified thoracic aorta. Clear lungs with normal vascularity. Stable mild chronic peribronchial thickening. Stable midthoracic vertebral compression deformity and kyphoplasty material. IMPRESSION: 1. No acute abnormality. 2. Stable mild chronic bronchitic changes. Electronically Signed   By: Elspeth Bathe M.D.   On: 09/30/2024 12:58      Scheduled Meds:  aspirin  EC  81 mg Oral Daily   citalopram   20 mg Oral Daily   docusate sodium   100 mg  Oral BID   DULoxetine   30 mg Oral QHS   [START ON 10/02/2024] feeding supplement  237 mL Oral BID BM   midodrine  5 mg Oral TID WC   [START ON 10/02/2024] multivitamin with minerals  1 tablet Oral Daily   [START ON 10/02/2024] nutrition supplement (JUVEN)  1 packet Oral BID BM   oxyCODONE   10 mg Oral Q12H   pregabalin   150 mg Oral TID   rosuvastatin   20 mg Oral Daily   tamsulosin   0.4 mg Oral QPC supper   [START ON 10/02/2024] thiamine  100 mg Oral Daily   Continuous Infusions:  sodium chloride  999 mL/hr at 09/30/24 1527   heparin      vancomycin        LOS:  7 days    Almarie KANDICE Hoots, MD 10/01/2024, 4:02 PM

## 2024-10-02 DIAGNOSIS — I739 Peripheral vascular disease, unspecified: Secondary | ICD-10-CM | POA: Diagnosis not present

## 2024-10-02 DIAGNOSIS — E43 Unspecified severe protein-calorie malnutrition: Secondary | ICD-10-CM | POA: Insufficient documentation

## 2024-10-02 LAB — TYPE AND SCREEN
ABO/RH(D): O POS
Antibody Screen: NEGATIVE
Unit division: 0
Unit division: 0

## 2024-10-02 LAB — CBC
HCT: 30 % — ABNORMAL LOW (ref 39.0–52.0)
Hemoglobin: 9.7 g/dL — ABNORMAL LOW (ref 13.0–17.0)
MCH: 31 pg (ref 26.0–34.0)
MCHC: 32.3 g/dL (ref 30.0–36.0)
MCV: 95.8 fL (ref 80.0–100.0)
Platelets: 165 K/uL (ref 150–400)
RBC: 3.13 MIL/uL — ABNORMAL LOW (ref 4.22–5.81)
RDW: 16.2 % — ABNORMAL HIGH (ref 11.5–15.5)
WBC: 9.3 K/uL (ref 4.0–10.5)
nRBC: 0 % (ref 0.0–0.2)

## 2024-10-02 LAB — BASIC METABOLIC PANEL WITH GFR
Anion gap: 9 (ref 5–15)
BUN: 16 mg/dL (ref 8–23)
CO2: 23 mmol/L (ref 22–32)
Calcium: 8.5 mg/dL — ABNORMAL LOW (ref 8.9–10.3)
Chloride: 102 mmol/L (ref 98–111)
Creatinine, Ser: 1.06 mg/dL (ref 0.61–1.24)
GFR, Estimated: >60 mL/min (ref >60)
Glucose, Bld: 113 mg/dL — ABNORMAL HIGH (ref 70–99)
Potassium: 4.2 mmol/L (ref 3.5–5.1)
Sodium: 134 mmol/L — ABNORMAL LOW (ref 135–145)

## 2024-10-02 LAB — BPAM RBC
Blood Product Expiration Date: 202510302359
Blood Product Expiration Date: 202510302359
Unit Type and Rh: 5100
Unit Type and Rh: 5100

## 2024-10-02 LAB — VITAMIN D 25 HYDROXY (VIT D DEFICIENCY, FRACTURES): Vit D, 25-Hydroxy: 51.12 ng/mL (ref 30–100)

## 2024-10-02 LAB — HEPARIN LEVEL (UNFRACTIONATED)
Heparin Unfractionated: 0.45 [IU]/mL (ref 0.30–0.70)
Heparin Unfractionated: 0.43 [IU]/mL (ref 0.30–0.70)

## 2024-10-02 LAB — C-REACTIVE PROTEIN: CRP: 6.4 mg/dL — ABNORMAL HIGH (ref <1.0)

## 2024-10-02 MED ORDER — SENNOSIDES-DOCUSATE SODIUM 8.6-50 MG PO TABS
3.0000 | ORAL_TABLET | Freq: Every day | ORAL | Status: DC
  Administered 2024-10-02 – 2024-10-04 (×2): 3 via ORAL
  Filled 2024-10-02 (×2): qty 3

## 2024-10-02 MED ORDER — POLYETHYLENE GLYCOL 3350 17 G PO PACK
17.0000 g | PACK | Freq: Two times a day (BID) | ORAL | Status: DC
  Administered 2024-10-02 – 2024-10-04 (×4): 17 g via ORAL
  Filled 2024-10-02 (×7): qty 1

## 2024-10-02 MED ORDER — BISACODYL 5 MG PO TBEC
10.0000 mg | DELAYED_RELEASE_TABLET | Freq: Once | ORAL | Status: AC
  Administered 2024-10-02: 10 mg via ORAL
  Filled 2024-10-02: qty 2

## 2024-10-02 NOTE — Plan of Care (Signed)
  Problem: Clinical Measurements: Goal: Will remain free from infection Outcome: Progressing   Problem: Nutrition: Goal: Adequate nutrition will be maintained Outcome: Progressing   Problem: Pain Managment: Goal: General experience of comfort will improve and/or be controlled Outcome: Progressing   Problem: Safety: Goal: Ability to remain free from injury will improve Outcome: Progressing   Problem: Skin Integrity: Goal: Risk for impaired skin integrity will decrease Outcome: Progressing

## 2024-10-02 NOTE — Progress Notes (Addendum)
  Progress Note    10/02/2024 8:42 AM 1 Day Post-Op  Subjective: No complaints this morning   Vitals:   10/02/24 0415 10/02/24 0821  BP: (!) 156/93 113/65  Pulse: 72 96  Resp: 14 11  Temp: 97.7 F (36.5 C) 98.2 F (36.8 C)  SpO2: 97% 96%   Physical Exam: Lungs:  non labored Incisions: Right chest and groin incisions are well-appearing Extremities: Brisk right AT and PT signals by Doppler; vac R leg with good seal Neurologic: A&O  CBC    Component Value Date/Time   WBC 9.3 10/02/2024 0353   RBC 3.13 (L) 10/02/2024 0353   HGB 9.7 (L) 10/02/2024 0353   HCT 30.0 (L) 10/02/2024 0353   PLT 165 10/02/2024 0353   MCV 95.8 10/02/2024 0353   MCH 31.0 10/02/2024 0353   MCHC 32.3 10/02/2024 0353   RDW 16.2 (H) 10/02/2024 0353   LYMPHSABS 1.1 09/29/2024 0355   MONOABS 0.5 09/29/2024 0355   EOSABS 0.2 09/29/2024 0355   BASOSABS 0.0 09/29/2024 0355    BMET    Component Value Date/Time   NA 134 (L) 10/02/2024 0353   K 4.2 10/02/2024 0353   CL 102 10/02/2024 0353   CO2 23 10/02/2024 0353   GLUCOSE 113 (H) 10/02/2024 0353   BUN 16 10/02/2024 0353   CREATININE 1.06 10/02/2024 0353   CREATININE 1.06 10/10/2020 0915   CALCIUM  8.5 (L) 10/02/2024 0353   GFRNONAA >60 10/02/2024 0353   GFRNONAA 73 10/10/2020 0915   GFRAA 84 10/10/2020 0915    INR    Component Value Date/Time   INR 1.2 08/22/2022 0833     Intake/Output Summary (Last 24 hours) at 10/02/2024 0842 Last data filed at 10/02/2024 0415 Gross per 24 hour  Intake 1470.44 ml  Output 470 ml  Net 1000.44 ml     Assessment/Plan:  68 y.o. male is s/p  1: Right axillary bifemoral bypass graft                       #2: Bilateral common femoral, superficial femoral, and profundofemoral thrombectomy                       #3: Redo left common femoral artery exposure                       #4: 4 compartment right leg fasciotomy                       #5: Resection of necrotic muscle in the right anterior compartment                        #6: Bilateral femoral endarterectomy  1 Day Post-Op   Postoperative day 1 after R leg washout.  Vac has a good seal.  R AT and PT by doppler however no sensation or motor in R foot.  Ok to work with PT/OT.  Plan is for return to OR with Dr. Harden 10/8   Donnice Sender, PA-C [facility] [phone] 10/02/2024 8:42 AM  VASCULAR STAFF ADDENDUM: I have independently interviewed and examined the patient. I agree with the above.   Norman GORMAN Serve MD [facility] Phone Number: [phone] 10/02/2024 9:14 AM

## 2024-10-02 NOTE — Progress Notes (Signed)
 PHARMACY - ANTICOAGULATION CONSULT NOTE  Pharmacy Consult for Heparin  Indication:  arterial insufficiency / occlusion  Allergies  Allergen Reactions   Ancef  [Cefazolin ] Anaphylaxis    Patient Measurements: Height: 5' 9 (175.3 cm) Weight: 83.2 kg (183 lb 6.8 oz) IBW/kg (Calculated) : 70.7 HEPARIN  DW (KG): 83.2 Heparin  dosing weight = 83 kg  Vital Signs: Temp: 97.8 F (36.6 C) (10/04 1132) Temp Source: Oral (10/04 1132) BP: 112/71 (10/04 1132) Pulse Rate: 75 (10/04 1132)  Labs: Recent Labs    09/30/24 1531 10/01/24 0510 10/02/24 0353 10/02/24 0954  HGB 9.9* 9.1* 9.7*  --   HCT 30.3* 27.3* 30.0*  --   PLT 150 153 165  --   HEPARINUNFRC  --  0.43 0.45 0.43  CREATININE 1.21 1.07 1.06  --     Estimated Creatinine Clearance: 64.8 mL/min (by C-G formula based on SCr of 1.06 mg/dL).  Assessment: 75 YOM admitted w/ severe vascular insufficiency who is tentatively scheduled for right axillary bifemoral bypass w/ possible right common femoral to popliteal bypass. No PTA AC noted through a search of his EMR. Of note, he had recent lumbar fusion on 09/08/2024. Consult for heparin  therapy received 09/22/24. Patient subsequently transferred from CIR to inpatient on 9/25.   Patient is s/p R calf lateral fasciotomy washout, debridement of skin and soft tissue on 9/29 and I&D on 10/1. Patient got I& D RLE wound, VAC application on 10/3, for which heparin  was held at 10:22 AM. Restarted ~1600 10/3.  10/02/24 update: -Heparin  level therapeutic at 0.43 on heparin  1900 units/hr -Hgb, PLT WNL -No s/sx bleeding per RN  Goal of Therapy:  Heparin  level 0.3-0.5 units/ml Monitor platelets by anticoagulation protocol: Yes   Plan:  Continue heparin  1900 units/hr Check heparin  level daily while on heparin  Continue to monitor H&H and platelets  Thank you for allowing pharmacy to be a part of this patient's care.  Izetta Carl, PharmD PGY1 Pharmacy Resident

## 2024-10-02 NOTE — Progress Notes (Signed)
 Physical Therapy Treatment Patient Details Name: David King MRN: [id number] DOB: [birth year] Today's Date: 10/02/2024   History of Present Illness 68 y.o. 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 Pt went to OR x2 on 9/26 initially for R axial bypass, bilateral femoral thrombectomy and RLE fasciotomy and later for redo RLE thrombectomy. Pt recently underwent L4-5 decompression and fusion on 9/10 with anaphylactic reaction during surgery requiring ICU stay. PMHx: COPD, CAD, PAD, tobacco use, lumbar radiculopathy, detached retina L eye    PT Comments  Pt is currently Max A for sit to stand at EOB with heavy UE support on RW and Mod A for squat pivot transfer from EOB to recliner. Pt unable to progress gait due to current transfer only status from Fonda Rim MD. Pt very motivated and spouse is very supportive. Due to pt current functional status, home set up and available assistance at home recommending skilled physical therapy services > 3 hours/day in order to address strength, balance and functional mobility to decrease risk for falls, injury, immobility, skin break down and re-hospitalization.     If plan is discharge home, recommend the following: A lot of help with walking and/or transfers;Assistance with cooking/housework;Assist for transportation;Help with stairs or ramp for entrance     Equipment Recommendations  Wheelchair (measurements PT);Wheelchair cushion (measurements PT);BSC/3in1       Precautions / Restrictions Precautions Precautions: Fall;Back;Other (comment) Recall of Precautions/Restrictions: Intact Precaution/Restrictions Comments: watch BP (orthostatic); RLE foot drop Required Braces or Orthoses: Spinal Brace Spinal Brace: Thoracolumbosacral orthotic;Applied in sitting position Restrictions Weight Bearing Restrictions Per Provider Order:  No Other Position/Activity Restrictions: transfers only     Mobility  Bed Mobility Overal bed mobility: Needs Assistance Bed Mobility: Rolling, Sidelying to Sit, Sit to Sidelying Rolling: Supervision Sidelying to sit: Supervision     Sit to sidelying: Supervision General bed mobility comments: S for safety and line management    Transfers Overall transfer level: Needs assistance Equipment used: Rolling walker (2 wheels) Transfers: Sit to/from Stand, Bed to chair/wheelchair/BSC Sit to Stand: Max assist     Squat pivot transfers: Mod assist     General transfer comment: Pt was Max A for sit to stand; initially attempted but unable to get fully to standing with little to no weight through RLE. Pt then performed second attempt and with Max A was able to get to standing with heavy UE support through RW. PT was Mod A for squat pivot to the L with verbal cues for sequencing and one initial pivot to get toward EOB then to recliner due to fatigue.    Ambulation/Gait     General Gait Details: unable at this time. MD reports transfers only      Balance Overall balance assessment: Needs assistance Sitting-balance support: Feet supported, Single extremity supported Sitting balance-Leahy Scale: Fair Sitting balance - Comments: L rail   Standing balance support: Bilateral upper extremity supported, Reliant on assistive device for balance Standing balance-Leahy Scale: Poor Standing balance comment: reliant on AD and external support.      Communication Communication Communication: No apparent difficulties  Cognition Arousal: Alert Behavior During Therapy: WFL for tasks assessed/performed   PT - Cognitive impairments: No apparent impairments     Following commands: Intact Following commands impaired: Follows one step commands with increased time    Cueing Cueing Techniques: Verbal cues     General Comments General  comments (skin integrity, edema, etc.): Pt continues  symptomatic in standing; unable to get BP due to requiring full hands on pt to prevent fall.      Pertinent Vitals/Pain Pain Assessment Pain Assessment: Faces Faces Pain Scale: Hurts whole lot Breathing: occasional labored breathing, short period of hyperventilation Negative Vocalization: occasional moan/groan, low speech, negative/disapproving quality Facial Expression: sad, frightened, frown Body Language: tense, distressed pacing, fidgeting Consolability: distracted or reassured by voice/touch PAINAD Score: 5 Pain Location: RLE Pain Descriptors / Indicators: Discomfort, Grimacing Pain Intervention(s): Monitored during session, Limited activity within patient's tolerance, RN gave pain meds during session     PT Goals (current goals can now be found in the care plan section) Acute Rehab PT Goals Patient Stated Goal: to return to independence and reduce pain PT Goal Formulation: With patient Time For Goal Achievement: 10/09/24 Potential to Achieve Goals: Fair Progress towards PT goals: Progressing toward goals    Frequency    Min 3X/week      PT Plan  Continue with current POC        AM-PAC PT 6 Clicks Mobility   Outcome Measure  Help needed turning from your back to your side while in a flat bed without using bedrails?: A Little Help needed moving from lying on your back to sitting on the side of a flat bed without using bedrails?: A Little Help needed moving to and from a bed to a chair (including a wheelchair)?: A Lot Help needed standing up from a chair using your arms (e.g., wheelchair or bedside chair)?: A Lot Help needed to walk in hospital room?: Total Help needed climbing 3-5 steps with a railing? : Total 6 Click Score: 12    End of Session Equipment Utilized During Treatment: Back brace;Gait belt Activity Tolerance: Patient tolerated treatment well;No increased pain;Patient limited by fatigue Patient left: in chair;with call bell/phone within  reach;with family/visitor present Nurse Communication: Mobility status PT Visit Diagnosis: Other abnormalities of gait and mobility (R26.89);Muscle weakness (generalized) (M62.81);Other symptoms and signs involving the nervous system (R29.898)     Time: 8553-8454 PT Time Calculation (min) (ACUTE ONLY): 59 min  Charges:    $Therapeutic Activity: 53-67 mins PT General Charges $$ ACUTE PT VISIT: 1 Visit                     [name], DPT, CLT  Acute Rehabilitation Services Office: [phone] (Secure chat preferred)    [name] 10/02/2024, 3:58 PM

## 2024-10-02 NOTE — Progress Notes (Signed)
 PROGRESS NOTE    David King  FMW:[id number] DOB: Jul 09, 1956 DOA: 09/24/2024 PCP: [name], MD    Brief Narrative: 68 y.o. male with PMH significant for HTN, HLD, retinal detachment, prostate cancer, PAD s/p b/l common [place] artery stenting, chronic back pain, lumbar radiculopathy.Because of progressive radiculopathy pain, patient was following up with orthopedics as an outpatient.  He had previous L4-L5 disc decompression.  Recent MRI showed severe stenosis of the left L4-L5 due to large disc herniation.   -- 09/08/2024, patient underwent revision L4-L5 decompression, lumbar body fusion by Dr. Beuford.  During the procedure, patient had an anaphylactic reaction to an unknown agent and developed hives with some hypotension. He required short-term intubation and transferred to ICU, post recovery he was noted to have some improvement in his left leg pain, postop site looked okay but he developed some right lower extremity weakness.  Neurology was consulted.  MRI brain was unremarkable.  EMG showed nonspecific changes.  -- 9/17, discharged to CIR. 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 -- 9/26, patient was readmitted to acute care with a plan of revascularization.  Patient underwent right axillary femoral bypass graft, bilateral common femoral, superficial femoral and profundofemoral thrombectomy, right leg anterior compartment fasciotomy due to compartment syndrome, right femoral endarterectomy. Soon after coming out of the OR, he was found to have decrease in his right lower extremity pulses again. Seen by vascular surgery, taken to the OR again.  Underwent right leg angiogram, found to have thrombus in the popliteal artery, underwent successful thrombectomy. Postop, admitted to  TRH -- 9/29, underwent right calf lateral fasciotomy washout, skin debridement and wound VAC placement.  -09/28/2024 right calf lateral fasciotomy without any skin debridement and wound VAC placement - 09/29/2024 excisional debridement skin and soft tissue muscle and fascia anterior compartment of the right leg transfer of the lateral compartment muscles to the anterior compartment to cover the anterior tibial vessels.  Dr. 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 9/30 right calf lateral fasciotomy without any skin debridement and wound VAC placement Currently on heparin  drip, aspirin  81 mg daily, statin Vascular surgery following Per vascular note, bilateral lower extremities are well-perfused with brisk PT Doppler signals..  But right foot with no motor or sensory function.   09/29/2024 Dr. Harden S/P  Excisional debridement skin soft tissue muscle and fascia anterior compartment right leg.  Transfer of the lateral compartment muscles to the anterior compartment to cover the anterior tibial vessels.  Repeat debridement 10/01/2024 with vascular status post I&D and excisional debridement And Dr. Harden to do serial debridement next Wednesday 10/8    Lumbar  radiculopathy S/p recent L4-L5  decompression/fusion -9/10 Dr Beuford Continue morphine  and OxyContin  and oxycodone  Lyrica    Acute blood loss anemia-hemoglobin dropped as low as 6.8 status post 1 unit of packed RBC now stable at 9.5  AKI Resolved  with IV hydration.    Acute urinary retention Foul-smelling urine H/o prostate cancer he had Foley catheter inserted on admission.  This was removed on 27 September.  Since then he required multiple In-N-Out catheterization.  With routine reminders he is able to urinate without having a Foley in place.  Continue Flomax .   Hyponatremia Mild.  Continue to monitor.improving   Hyperlipidemia Crestor  20 mg daily   COPD Bronchodilators as needed   Diverticulosis Encourage regular bowel regimen Currently on Colace 100 mg twice daily.   Anxiety/depression Celexa  20 mg daily, Cymbalta  nightly, Ambien  nightly as needed  Wound 09/15/24 2032 Pressure Injury Buttocks Bilateral;Right Deep Tissue Pressure Injury - Purple or maroon localized area of discolored intact skin or blood-filled blister due to damage of underlying soft tissue from pressure and/or shear. (Active)     Wound 09/15/24 1848 Pressure Injury Heel Right Deep Tissue Pressure Injury - Purple or maroon localized area of discolored intact skin or blood-filled blister due to damage of underlying soft tissue from pressure and/or shear. (Active)     Estimated body mass index is 27.09 kg/m as calculated from the following:   Height as of this encounter: 5' 9 (1.753 m).   Weight as of this encounter: 83.2 kg.  DVT prophylaxis: heparin  Code Status: full Family Communication: none Disposition Plan:  Status is: Inpatient Remains inpatient appropriate because:    Consultants: ortho vascular   Procedures: see above Antimicrobials:none  Subjective:  He is resting in bed he is requesting a regular diet instead of a cardiac diet Unclear when was his last bowel movement He is  urinating without difficulty  Objective: Vitals:   10/02/24 0002 10/02/24 0415 10/02/24 0821 10/02/24 0908  BP: 125/68 (!) 156/93 113/65 125/65  Pulse: 72 72 96   Resp: 18 14 11 14   Temp: 97.7 F (36.5 C) 97.7 F (36.5 C) 98.2 F (36.8 C)   TempSrc: Oral Oral Oral   SpO2: 96% 97% 96%   Weight:      Height:        Intake/Output Summary (Last 24 hours) at 10/02/2024 1050 Last data filed at 10/02/2024 0900 Gross per 24 hour  Intake 1376.5 ml  Output 120 ml  Net 1256.5 ml   Filed Weights   09/29/24 1049 10/01/24 1038  Weight: 83.2 kg 83.2 kg    Examination:  General exam: Appears in nad Respiratory system: Clear to auscultation. Respiratory effort normal. Cardiovascular system:reg Gastrointestinal system: Abdomen is nondistended, soft and nontender. No organomegaly or masses felt. Normal bowel sounds heard. Central nervous system: Alert and oriented. No focal neurological deficits. Extremities: rle with vac   Data Reviewed: I have personally reviewed following labs and imaging studies  CBC: Recent Labs  Lab 09/26/24 0257 09/26/24 0852 09/27/24 0349 09/28/24 0648 09/29/24 0355 09/30/24 0505 09/30/24 1531 10/01/24 0510 10/02/24 0353  WBC 9.5  --  6.4 5.3 5.1 6.0 8.7 8.7 9.3  NEUTROABS 7.6  --  4.9 4.2 3.1  --   --   --   --   HGB 6.8*   < > 7.5* 7.3* 9.4* 9.5* 9.9* 9.1* 9.7*  HCT 20.5*   < > 23.8* 22.5* 27.9* 29.0* 30.3* 27.3* 30.0*  MCV 100.0  --  100.4* 95.7 92.4 92.7 93.5 93.2 95.8  PLT 137*  --  118* 126* 134* 150 150 153 165   < > = values in this interval not displayed.   Basic Metabolic Panel: Recent Labs  Lab 09/26/24 0257 09/27/24 0349 09/28/24 0648 09/29/24 0355 09/30/24 0505 09/30/24 1531 10/01/24 0510 10/02/24 0353  NA 133* 131* 131* 133* 132* 135 137 134*  K 4.1 3.9 4.4 3.7 4.0 3.7 3.6 4.2  CL 99 103 98 102 99 101 102 102  CO2 25 23 23 25 24 22 25 23   GLUCOSE 109* 94 114* 96 105* 116* 93 113*  BUN 11 12 15 16 13 16 13 16   CREATININE  1.28* 1.26* 1.04 1.02 1.09 1.21 1.07 1.06  CALCIUM  7.6* 7.4* 7.6* 7.8* 8.3* 8.2* 8.5* 8.5*  MG 2.4 2.3 2.1  --   --   --   --   --   PHOS 2.9 3.4 3.6  --   --   --   --   --    GFR: Estimated Creatinine Clearance: 64.8 mL/min (by C-G formula based on SCr of 1.06 mg/dL). Liver Function Tests: Recent Labs  Lab 09/26/24 0257 09/27/24 0349 09/28/24 0648 09/29/24 0355 09/30/24 1531  AST 31 40 51* 59* 45*  ALT 39 40 56* 70* 64*  ALKPHOS 50 61 83 93 90  BILITOT 0.9 1.0 1.0 1.1 0.8  PROT 5.1* 4.9* 5.1* 5.4* 5.7*  ALBUMIN  2.4* 2.1* 2.2* 2.3* 2.5*   No results for input(s): LIPASE, AMYLASE in the last 168 hours. No results for input(s): AMMONIA in the last 168 hours. Coagulation Profile: No results for input(s): INR, PROTIME in the last 168 hours. Cardiac Enzymes: No results for input(s): CKTOTAL, CKMB, CKMBINDEX, TROPONINI in the last 168 hours.  BNP (last 3 results) No results for input(s): PROBNP in the last 8760 hours. HbA1C: No results for input(s): HGBA1C in the last 72 hours. CBG: Recent Labs  Lab 10/01/24 1628  GLUCAP 131*   Lipid Profile: No results for input(s): CHOL, HDL, LDLCALC, TRIG, CHOLHDL, LDLDIRECT in the last 72 hours. Thyroid Function Tests: No results for input(s): TSH, T4TOTAL, FREET4, T3FREE, THYROIDAB in the last 72 hours. Anemia Panel: No results for input(s): VITAMINB12, FOLATE, FERRITIN, TIBC, IRON, RETICCTPCT in the last 72 hours. Sepsis Labs: No results for input(s): PROCALCITON, LATICACIDVEN in the last 168 hours.  No results found for this or any previous visit (from the past 240 hours).    Radiology Studies: No results found.     Scheduled Meds:  aspirin  EC  81 mg Oral Daily   citalopram   20 mg Oral Daily   docusate sodium   100 mg Oral BID   DULoxetine   30 mg Oral QHS   feeding supplement  237 mL Oral BID BM   midodrine  5 mg Oral TID WC   multivitamin with minerals  1  tablet Oral Daily   nutrition supplement (JUVEN)  1 packet Oral BID BM   oxyCODONE   10 mg Oral Q12H   pregabalin   150 mg Oral TID   rosuvastatin   20 mg Oral Daily   tamsulosin   0.4 mg Oral QPC supper   thiamine  100 mg Oral Daily   Continuous Infusions:  heparin  1,900 Units/hr (10/02/24 0602)     LOS: 8 days    Almarie KANDICE Hoots, MD 10/02/2024, 10:50 AM

## 2024-10-03 DIAGNOSIS — I739 Peripheral vascular disease, unspecified: Secondary | ICD-10-CM | POA: Diagnosis not present

## 2024-10-03 LAB — CBC
HCT: 29.1 % — ABNORMAL LOW (ref 39.0–52.0)
Hemoglobin: 9.5 g/dL — ABNORMAL LOW (ref 13.0–17.0)
MCH: 31.5 pg (ref 26.0–34.0)
MCHC: 32.6 g/dL (ref 30.0–36.0)
MCV: 96.4 fL (ref 80.0–100.0)
Platelets: 177 K/uL (ref 150–400)
RBC: 3.02 MIL/uL — ABNORMAL LOW (ref 4.22–5.81)
RDW: 16.3 % — ABNORMAL HIGH (ref 11.5–15.5)
WBC: 12.3 K/uL — ABNORMAL HIGH (ref 4.0–10.5)
nRBC: 0 % (ref 0.0–0.2)

## 2024-10-03 LAB — HEPARIN LEVEL (UNFRACTIONATED): Heparin Unfractionated: 0.61 [IU]/mL (ref 0.30–0.70)

## 2024-10-03 LAB — BASIC METABOLIC PANEL WITH GFR
Anion gap: 11 (ref 5–15)
BUN: 27 mg/dL — ABNORMAL HIGH (ref 8–23)
CO2: 24 mmol/L (ref 22–32)
Calcium: 8.7 mg/dL — ABNORMAL LOW (ref 8.9–10.3)
Chloride: 99 mmol/L (ref 98–111)
Creatinine, Ser: 1.15 mg/dL (ref 0.61–1.24)
GFR, Estimated: >60 mL/min (ref >60)
Glucose, Bld: 99 mg/dL (ref 70–99)
Potassium: 4.1 mmol/L (ref 3.5–5.1)
Sodium: 134 mmol/L — ABNORMAL LOW (ref 135–145)

## 2024-10-03 MED ORDER — LACTULOSE 10 GM/15ML PO SOLN
20.0000 g | Freq: Two times a day (BID) | ORAL | Status: AC
  Administered 2024-10-03 – 2024-10-04 (×3): 20 g via ORAL
  Filled 2024-10-03 (×3): qty 30

## 2024-10-03 NOTE — Progress Notes (Signed)
 PHARMACY - ANTICOAGULATION CONSULT NOTE  Pharmacy Consult for Heparin  Indication:  arterial insufficiency / occlusion  Allergies  Allergen Reactions   Ancef  [Cefazolin ] Anaphylaxis    Patient Measurements: Height: 5' 9 (175.3 cm) Weight: 83.2 kg (183 lb 6.8 oz) IBW/kg (Calculated) : 70.7 HEPARIN  DW (KG): 83.2 Heparin  dosing weight = 83 kg  Vital Signs: Temp: 98.1 F (36.7 C) (10/05 0729) Temp Source: Oral (10/05 0729) BP: 117/81 (10/05 0729) Pulse Rate: 81 (10/05 0729)  Labs: Recent Labs    10/01/24 0510 10/02/24 0353 10/02/24 0954 10/03/24 0300  HGB 9.1* 9.7*  --  9.5*  HCT 27.3* 30.0*  --  29.1*  PLT 153 165  --  177  HEPARINUNFRC 0.43 0.45 0.43 0.61  CREATININE 1.07 1.06  --  1.15    Estimated Creatinine Clearance: 59.8 mL/min (by C-G formula based on SCr of 1.15 mg/dL).  Assessment: 40 YOM admitted w/ severe vascular insufficiency who is tentatively scheduled for right axillary bifemoral bypass w/ possible right common femoral to popliteal bypass. No PTA AC noted through a search of his EMR. Of note, he had recent lumbar fusion on 09/08/2024. Consult for heparin  therapy received 09/22/24. Patient subsequently transferred from CIR to inpatient on 9/25.   Patient is s/p R calf lateral fasciotomy washout, debridement of skin and soft tissue on 9/29 and I&D on 10/1. Patient got I& D RLE wound, VAC application on 10/3, for which heparin  was held at 10:22 AM. Restarted ~1600 10/3.   10/03/24 update: -Heparin  level therapeutic at 0.61 on heparin  1900 units/hr -Hgb, PLT WNL -No s/sx bleeding per RN  Goal of Therapy:  Heparin  level 0.3-0.5 units/ml Monitor platelets by anticoagulation protocol: Yes   Plan:  Continue heparin  1900 units/hr Check heparin  level daily while on heparin  Continue to monitor H&H and platelets  Thank you for allowing pharmacy to be a part of this patient's care.  Izetta Carl, PharmD PGY1 Pharmacy Resident

## 2024-10-03 NOTE — Progress Notes (Addendum)
  Progress Note    10/03/2024 8:27 AM 2 Days Post-Op  Subjective: no complaints   Vitals:   10/03/24 0352 10/03/24 0729  BP: 93/80 117/81  Pulse: 82 81  Resp: 18 18  Temp: 98.2 F (36.8 C) 98.1 F (36.7 C)  SpO2: 91% 95%   Physical Exam: Lungs:  non labored Incisions:  R lower leg vac with good seal; R chest and groins c/d/i Extremities:  R AT and PT by doppler Neurologic: A&O; motor and sensation deficit R foot  CBC    Component Value Date/Time   WBC 12.3 (H) 10/03/2024 0300   RBC 3.02 (L) 10/03/2024 0300   HGB 9.5 (L) 10/03/2024 0300   HCT 29.1 (L) 10/03/2024 0300   PLT 177 10/03/2024 0300   MCV 96.4 10/03/2024 0300   MCH 31.5 10/03/2024 0300   MCHC 32.6 10/03/2024 0300   RDW 16.3 (H) 10/03/2024 0300   LYMPHSABS 1.1 09/29/2024 0355   MONOABS 0.5 09/29/2024 0355   EOSABS 0.2 09/29/2024 0355   BASOSABS 0.0 09/29/2024 0355    BMET    Component Value Date/Time   NA 134 (L) 10/03/2024 0300   K 4.1 10/03/2024 0300   CL 99 10/03/2024 0300   CO2 24 10/03/2024 0300   GLUCOSE 99 10/03/2024 0300   BUN 27 (H) 10/03/2024 0300   CREATININE 1.15 10/03/2024 0300   CREATININE 1.06 10/10/2020 0915   CALCIUM  8.7 (L) 10/03/2024 0300   GFRNONAA >60 10/03/2024 0300   GFRNONAA 73 10/10/2020 0915   GFRAA 84 10/10/2020 0915    INR    Component Value Date/Time   INR 1.2 08/22/2022 0833     Intake/Output Summary (Last 24 hours) at 10/03/2024 0827 Last data filed at 10/03/2024 0354 Gross per 24 hour  Intake 720 ml  Output 1380 ml  Net -660 ml     Assessment/Plan:  68 y.o. male is s/p  1: Right axillary bifemoral bypass graft                       #2: Bilateral common femoral, superficial femoral, and profundofemoral thrombectomy                       #3: Redo left common femoral artery exposure                       #4: 4 compartment right leg fasciotomy                       #5: Resection of necrotic muscle in the right anterior compartment                        #6: Bilateral femoral endarterectomy  1 Day Post-Op   2 Days Post-Op   Vac continues to have a good seal; No motor or sensation R foot despite revascularization.  R AT/PT signals brisk by doppler.  Plan is to return to OR for washout with Dr Harden Wednesday 10/8   Donnice Sender, PA-C [facility] [phone] 10/03/2024 8:27 AM   VASCULAR STAFF ADDENDUM: I have independently interviewed and examined the patient. I agree with the above.    Norman GORMAN Serve MD [facility] Phone Number: [phone] 10/03/2024 9:44 AM

## 2024-10-03 NOTE — Anesthesia Postprocedure Evaluation (Signed)
 Anesthesia Post Note  Patient: David King  Procedure(s) Performed: IRRIGATION AND DEBRIDEMENT WOUND (Right: Leg Lower) APPLICATION, SKIN SUBSTITUTE KERECIS 95SQ CM (Right: Leg Lower) APPLICATION, WOUND VAC (Right: Leg Lower)     Patient location during evaluation: PACU Anesthesia Type: General Level of consciousness: awake Pain management: pain level controlled Vital Signs Assessment: post-procedure vital signs reviewed and stable Respiratory status: spontaneous breathing Cardiovascular status: blood pressure returned to baseline Postop Assessment: no headache and no apparent nausea or vomiting Anesthetic complications: no   No notable events documented.           Lauraine KATHEE [place]

## 2024-10-03 NOTE — Plan of Care (Signed)
  Problem: Clinical Measurements: Goal: Will remain free from infection Outcome: Progressing   Problem: Activity: Goal: Risk for activity intolerance will decrease Outcome: Progressing   Problem: Nutrition: Goal: Adequate nutrition will be maintained Outcome: Progressing   Problem: Pain Managment: Goal: General experience of comfort will improve and/or be controlled Outcome: Progressing

## 2024-10-03 NOTE — Progress Notes (Signed)
 PROGRESS NOTE    David King  FMW:[id number] DOB: 1956/04/22 DOA: 09/24/2024 PCP: [name], MD    Brief Narrative: 68 y.o. male with PMH significant for HTN, HLD, retinal detachment, prostate cancer, PAD s/p b/l common [place] artery stenting, chronic back pain, lumbar radiculopathy.Because of progressive radiculopathy pain, patient was following up with orthopedics as an outpatient.  He had previous L4-L5 disc decompression.  Recent MRI showed severe stenosis of the left L4-L5 due to large disc herniation.   -- 09/08/2024, patient underwent revision L4-L5 decompression, lumbar body fusion by Dr. Beuford.  During the procedure, patient had an anaphylactic reaction to an unknown agent and developed hives with some hypotension. He required short-term intubation and transferred to ICU, post recovery he was noted to have some improvement in his left leg pain, postop site looked okay but he developed some right lower extremity weakness.  Neurology was consulted.  MRI brain was unremarkable.  EMG showed nonspecific changes.  -- 9/17, discharged to CIR. 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 -- 9/26, patient was readmitted to acute care with a plan of revascularization.  Patient underwent right axillary femoral bypass graft, bilateral common femoral, superficial femoral and profundofemoral thrombectomy, right leg anterior compartment fasciotomy due to compartment syndrome, right femoral endarterectomy. Soon after coming out of the OR, he was found to have decrease in his right lower extremity pulses again. Seen by vascular surgery, taken to the OR again.  Underwent right leg angiogram, found to have thrombus in the popliteal artery, underwent successful thrombectomy. Postop, admitted to  [facility] -- 9/29, underwent right calf lateral fasciotomy washout, skin debridement and wound VAC placement.  -09/28/2024 right calf lateral fasciotomy without any skin debridement and wound VAC placement - 09/29/2024 excisional debridement skin and soft tissue muscle and fascia anterior compartment of the right leg transfer of the lateral compartment muscles to the anterior compartment to cover the anterior tibial vessels.  Dr. 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 9/30 right calf lateral fasciotomy without any skin debridement and wound VAC placement Currently on heparin  drip, aspirin  81 mg daily, statin Vascular surgery following Per vascular note, bilateral lower extremities are well-perfused with brisk PT Doppler signals..  But right foot with no motor or sensory function.   09/29/2024 Dr. Harden S/P  Excisional debridement skin soft tissue muscle and fascia anterior compartment right leg.  Transfer of the lateral compartment muscles to the anterior compartment to cover the anterior tibial vessels.  Repeat debridement 10/01/2024 with vascular status post I&D and excisional debridement And Dr. Harden to do serial debridement next Wednesday 10/8  He still does  not have sensation in  his right foot despite revascularization.   Lumbar radiculopathy S/p recent L4-L5 decompression/fusion -9/10 Dr Beuford Continue morphine  and OxyContin  and oxycodone  Lyrica    Acute blood loss anemia-hemoglobin dropped as low as 6.8 status post 1 unit of packed RBC now stable at 9.5  AKI Resolved  with IV hydration.    Acute urinary retention Foul-smelling urine H/o prostate cancer he had Foley catheter inserted on admission.  This was removed on 27 September.  Since then he required multiple In-N-Out catheterization.  With routine reminders he is able to urinate without having a Foley in place.  Continue Flomax .   Hyponatremia Mild.  Continue to monitor.improving   Hyperlipidemia Crestor  20 mg daily   COPD Bronchodilators as needed   Diverticulosis Encourage regular bowel regimen Currently on Colace 100 mg twice daily.   Anxiety/depression Celexa  20 mg daily, Cymbalta  nightly, Ambien  nightly as needed  Wound 09/15/24 2032 Pressure Injury Buttocks Bilateral;Right Deep Tissue Pressure Injury - Purple or maroon localized area of discolored intact skin or blood-filled blister due to damage of underlying soft tissue from pressure and/or shear. (Active)     Wound 09/15/24 1848 Pressure Injury Heel Right Deep Tissue Pressure Injury - Purple or maroon localized area of discolored intact skin or blood-filled blister due to damage of underlying soft tissue from pressure and/or shear. (Active)     Estimated body mass index is 27.09 kg/m as calculated from the following:   Height as of this encounter: 5' 9 (1.753 m).   Weight as of this encounter: 83.2 kg.  DVT prophylaxis: heparin  Code Status: full Family Communication: none Disposition Plan:  Status is: Inpatient Remains inpatient appropriate because:    Consultants: ortho vascular   Procedures: see above Antimicrobials:none  Subjective:  Resting in bed no events overnight Unclear when he moved  his bowels last Urinating fine Objective: Vitals:   10/03/24 0729 10/03/24 1257 10/03/24 1302 10/03/24 1557  BP: 117/81 115/61 115/61 (!) 105/59  Pulse: 81 95 94 84  Resp: 18 13 16 17   Temp: 98.1 F (36.7 C) 98.1 F (36.7 C) 98.1 F (36.7 C) 98 F (36.7 C)  TempSrc: Oral Axillary Oral Oral  SpO2: 95%  96% 97%  Weight:      Height:        Intake/Output Summary (Last 24 hours) at 10/03/2024 1616 Last data filed at 10/03/2024 1300 Gross per 24 hour  Intake 480 ml  Output 1380 ml  Net -900 ml   Filed Weights   09/29/24 1049 10/01/24 1038  Weight: 83.2 kg 83.2 kg    Examination:  General exam: Appears in nad Respiratory system: Clear to auscultation. Respiratory effort normal. Cardiovascular system:reg Gastrointestinal system: Abdomen is nondistended, soft and nontender. No organomegaly or masses felt. Normal bowel sounds heard. Central nervous system: Alert and oriented. No focal neurological deficits. Extremities: rle with vac   Data Reviewed: I have personally reviewed following labs and imaging studies  CBC: Recent Labs  Lab 09/27/24 0349 09/28/24 0648 09/29/24 0355 09/30/24 0505 09/30/24 1531 10/01/24 0510 10/02/24 0353 10/03/24 0300  WBC 6.4 5.3 5.1 6.0 8.7 8.7 9.3 12.3*  NEUTROABS 4.9 4.2 3.1  --   --   --   --   --   HGB 7.5* 7.3* 9.4* 9.5* 9.9* 9.1* 9.7* 9.5*  HCT 23.8* 22.5* 27.9* 29.0* 30.3* 27.3* 30.0* 29.1*  MCV 100.4* 95.7 92.4 92.7 93.5 93.2 95.8 96.4  PLT 118* 126* 134* 150 150 153 165 177   Basic Metabolic Panel: Recent Labs  Lab 09/27/24 0349 09/28/24 9351 09/29/24 0355 09/30/24 0505 09/30/24 1531 10/01/24 0510 10/02/24 0353 10/03/24 0300  NA 131* 131*   < > 132* 135 137 134* 134*  K 3.9 4.4   < > 4.0 3.7 3.6 4.2 4.1  CL 103 98   < > 99 101 102 102 99  CO2 23 23   < > 24 22 25 23 24   GLUCOSE 94 114*   < > 105* 116* 93 113* 99  BUN 12 15   < > 13 16 13 16  27*  CREATININE 1.26* 1.04   < > 1.09 1.21 1.07 1.06 1.15  CALCIUM  7.4*  7.6*   < > 8.3* 8.2* 8.5* 8.5* 8.7*  MG 2.3 2.1  --   --   --   --   --   --   PHOS 3.4 3.6  --   --   --   --   --   --    < > = values in this interval not displayed.   GFR: Estimated Creatinine Clearance: 59.8 mL/min (by C-G formula based on SCr of 1.15 mg/dL). Liver Function Tests: Recent Labs  Lab 09/27/24 0349 09/28/24 0648 09/29/24 0355 09/30/24 1531  AST 40 51* 59* 45*  ALT 40 56* 70* 64*  ALKPHOS 61 83 93 90  BILITOT 1.0 1.0 1.1 0.8  PROT 4.9* 5.1* 5.4* 5.7*  ALBUMIN  2.1* 2.2* 2.3* 2.5*   No results for input(s): LIPASE, AMYLASE in the last 168 hours. No results for input(s): AMMONIA in the last 168 hours. Coagulation Profile: No results for input(s): INR, PROTIME in the last 168 hours. Cardiac Enzymes: No results for input(s): CKTOTAL, CKMB, CKMBINDEX, TROPONINI in the last 168 hours.  BNP (last 3 results) No results for input(s): PROBNP in the last 8760 hours. HbA1C: No results for input(s): HGBA1C in the last 72 hours. CBG: Recent Labs  Lab 10/01/24 1628  GLUCAP 131*   Lipid Profile: No results for input(s): CHOL, HDL, LDLCALC, TRIG, CHOLHDL, LDLDIRECT in the last 72 hours. Thyroid Function Tests: No results for input(s): TSH, T4TOTAL, FREET4, T3FREE, THYROIDAB in the last 72 hours. Anemia Panel: No results for input(s): VITAMINB12, FOLATE, FERRITIN, TIBC, IRON, RETICCTPCT in the last 72 hours. Sepsis Labs: No results for input(s): PROCALCITON, LATICACIDVEN in the last 168 hours.  No results found for this or any previous visit (from the past 240 hours).    Radiology Studies: No results found.  Scheduled Meds:  aspirin  EC  81 mg Oral Daily   citalopram   20 mg Oral Daily   docusate sodium   100 mg Oral BID   DULoxetine   30 mg Oral QHS   feeding supplement  237 mL Oral BID BM   multivitamin with minerals  1 tablet Oral Daily   nutrition supplement (JUVEN)  1 packet Oral BID BM   oxyCODONE    10 mg Oral Q12H   polyethylene glycol  17 g Oral BID   pregabalin   150 mg Oral TID   rosuvastatin   20 mg Oral Daily   senna-docusate  3 tablet Oral QHS   tamsulosin   0.4 mg Oral QPC supper   thiamine  100 mg Oral Daily   Continuous Infusions:  heparin  1,900 Units/hr (10/03/24 0855)     LOS: 9 days    Almarie KANDICE Hoots, MD 10/03/2024, 4:16 PM

## 2024-10-03 NOTE — Progress Notes (Signed)
 Patient's wound vac was going off for blockage alarm, dressing changed, working well now.

## 2024-10-04 ENCOUNTER — Other Ambulatory Visit (HOSPITAL_COMMUNITY): Payer: Self-pay

## 2024-10-04 ENCOUNTER — Encounter (HOSPITAL_COMMUNITY): Payer: Self-pay | Admitting: Vascular Surgery

## 2024-10-04 ENCOUNTER — Inpatient Hospital Stay (HOSPITAL_COMMUNITY)

## 2024-10-04 DIAGNOSIS — I739 Peripheral vascular disease, unspecified: Secondary | ICD-10-CM | POA: Diagnosis not present

## 2024-10-04 LAB — CBC
HCT: 29.8 % — ABNORMAL LOW (ref 39.0–52.0)
Hemoglobin: 9.7 g/dL — ABNORMAL LOW (ref 13.0–17.0)
MCH: 31 pg (ref 26.0–34.0)
MCHC: 32.6 g/dL (ref 30.0–36.0)
MCV: 95.2 fL (ref 80.0–100.0)
Platelets: 208 K/uL (ref 150–400)
RBC: 3.13 MIL/uL — ABNORMAL LOW (ref 4.22–5.81)
RDW: 16.3 % — ABNORMAL HIGH (ref 11.5–15.5)
WBC: 13.9 K/uL — ABNORMAL HIGH (ref 4.0–10.5)
nRBC: 0 % (ref 0.0–0.2)

## 2024-10-04 LAB — HEPARIN LEVEL (UNFRACTIONATED): Heparin Unfractionated: 0.62 [IU]/mL (ref 0.30–0.70)

## 2024-10-04 LAB — ZINC: Zinc: 66 ug/dL (ref 44–115)

## 2024-10-04 MED ORDER — SMOG ENEMA
300.0000 mL | Freq: Once | RECTAL | Status: DC
  Filled 2024-10-04 (×2): qty 960

## 2024-10-04 MED ORDER — BISACODYL 10 MG RE SUPP
10.0000 mg | Freq: Once | RECTAL | Status: AC
  Administered 2024-10-04: 10 mg via RECTAL
  Filled 2024-10-04: qty 1

## 2024-10-04 NOTE — Progress Notes (Signed)
 Occupational Therapy Treatment Patient Details Name: David King MRN: [id number] DOB: Oct 10, 1956 Today's Date: 10/04/2024   History of present illness 68 y.o. 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 Pt went to OR x2 on 9/26 initially for R axial bypass, bilateral femoral thrombectomy and RLE fasciotomy and later for redo RLE thrombectomy. Pt recently underwent L4-5 decompression and fusion on 9/10 with anaphylactic reaction during surgery requiring ICU stay. PMHx: COPD, CAD, PAD, tobacco use, lumbar radiculopathy, detached retina L eye   OT comments  Pt progressing toward goals, recently returned back to bed due to RN giving suppository, but pt agreeable to EOB therex. Pt able to sit EOB x15 min for UB/LB therex, and educated on counterpressure techniques for OH symptoms. Pt noted to have incr dizziness with sitting and +orthostatics (see below) . Pt dons back brace with min A. Pt presenting with impairments listed below, will follow acutely. Patient will benefit from intensive inpatient follow-up therapy, >3 hours/day to maximize safety/ind with ADL/functional mobility.  BP supine 153/79 (99) BP seated EOB 125/80 (93) BP supine end of session 158/79 (98)       If plan is discharge home, recommend the following:  A lot of help with walking and/or transfers;A lot of help with bathing/dressing/bathroom;Assistance with cooking/housework;Assist for transportation   Equipment Recommendations  None recommended by OT    Recommendations for Other Services Rehab consult    Precautions / Restrictions Precautions Precautions: Fall;Back;Other (comment) Precaution Booklet Issued: Yes (comment) Recall of Precautions/Restrictions: Intact Precaution/Restrictions Comments: watch BP (orthostatic); RLE foot drop Required Braces or Orthoses: Spinal Brace Spinal Brace:  Thoracolumbosacral orthotic;Applied in sitting position Restrictions Weight Bearing Restrictions Per Provider Order: No Other Position/Activity Restrictions: transfers only       Mobility Bed Mobility Overal bed mobility: Needs Assistance Bed Mobility: Rolling, Sidelying to Sit, Sit to Sidelying Rolling: Contact guard assist Sidelying to sit: Contact guard assist            Transfers                   General transfer comment: pt declines, recently got back to bed after administration of suppository     Balance Overall balance assessment: Needs assistance Sitting-balance support: Feet supported, No upper extremity supported Sitting balance-Leahy Scale: Fair Sitting balance - Comments: L rail   Standing balance support: Bilateral upper extremity supported, Reliant on assistive device for balance Standing balance-Leahy Scale: Poor Standing balance comment: reliant on AD and external support.                           ADL either performed or assessed with clinical judgement   ADL Overall ADL's : Needs assistance/impaired                 Upper Body Dressing : Minimal assistance Upper Body Dressing Details (indicate cue type and reason): donning back brace                        Extremity/Trunk Assessment Upper Extremity Assessment Upper Extremity Assessment: Generalized weakness   Lower Extremity Assessment Lower Extremity Assessment: Defer to PT evaluation        Vision   Vision Assessment?: No apparent visual deficits   Perception Perception Perception: Not tested   Praxis Praxis Praxis: Not tested   Communication  Communication Communication: No apparent difficulties   Cognition Arousal: Alert Behavior During Therapy: WFL for tasks assessed/performed, Flat affect Cognition: No apparent impairments                               Following commands: Intact Following commands impaired: Only follows one  step commands consistently      Cueing   Cueing Techniques: Verbal cues, Tactile cues  Exercises Other Exercises Other Exercises: seated heel raises x10 Other Exercises: seated leg kicks x10 Other Exercises: seated marching x10 Other Exercises: BUE counterpressure technique x5    Shoulder Instructions       General Comments wound vac to suction, noted blood pooled in bandage area, RN notified    Pertinent Vitals/ Pain       Pain Assessment Pain Assessment: Faces Pain Score: 7  Faces Pain Scale: Hurts whole lot Pain Location: RLE Pain Descriptors / Indicators: Discomfort, Grimacing Pain Intervention(s): Limited activity within patient's tolerance, Monitored during session, Repositioned  Home Living                                          Prior Functioning/Environment              Frequency  Min 2X/week        Progress Toward Goals  OT Goals(current goals can now be found in the care plan section)  Progress towards OT goals: Progressing toward goals  Acute Rehab OT Goals Patient Stated Goal: none stated OT Goal Formulation: With patient Time For Goal Achievement: 10/14/24 Potential to Achieve Goals: Good ADL Goals Pt Will Perform Lower Body Bathing: sitting/lateral leans;with set-up;with adaptive equipment Pt Will Perform Lower Body Dressing: with mod assist;sit to/from stand Pt Will Transfer to Toilet: with mod assist;stand pivot transfer;bedside commode Pt/caregiver will Perform Home Exercise Program: Increased ROM;Both right and left upper extremity;Increased strength;With written HEP provided Additional ADL Goal #1: Pt will sit EOB independently for 3 meals/day as a precursor to OOB ADLs  Plan      Co-evaluation                 AM-PAC OT 6 Clicks Daily Activity     Outcome Measure   Help from another person eating meals?: None Help from another person taking care of personal grooming?: A Little Help from another person  toileting, which includes using toliet, bedpan, or urinal?: Total Help from another person bathing (including washing, rinsing, drying)?: A Lot Help from another person to put on and taking off regular upper body clothing?: A Little Help from another person to put on and taking off regular lower body clothing?: A Lot 6 Click Score: 15    End of Session    OT Visit Diagnosis: Unsteadiness on feet (R26.81);Other abnormalities of gait and mobility (R26.89);Muscle weakness (generalized) (M62.81);Pain Pain - Right/Left: Right Pain - part of body: Ankle and joints of foot   Activity Tolerance Patient tolerated treatment well   Patient Left in bed;with call bell/phone within reach   Nurse Communication Mobility status;Precautions        Time: 8748-8679 OT Time Calculation (min): 29 min  Charges: OT General Charges $OT Visit: 1 Visit OT Treatments $Self Care/Home Management : 8-22 mins $Therapeutic Activity: 8-22 mins  Zayne Draheim K, OTD, OTR/L SecureChat Preferred Acute Rehab [phone]   David King 10/04/2024, 2:20 PM

## 2024-10-04 NOTE — Progress Notes (Signed)
 PHARMACY - ANTICOAGULATION CONSULT NOTE  Pharmacy Consult for Heparin  Indication:  arterial insufficiency / occlusion  Allergies  Allergen Reactions   Ancef  [Cefazolin ] Anaphylaxis    Patient Measurements: Height: 5' 9 (175.3 cm) Weight: 83.2 kg (183 lb 6.8 oz) IBW/kg (Calculated) : 70.7 HEPARIN  DW (KG): 83.2 Heparin  dosing weight = 83 kg  Vital Signs: Temp: 97.9 F (36.6 C) (10/06 0815) Temp Source: Oral (10/06 0815) BP: 128/75 (10/06 0815) Pulse Rate: 88 (10/06 0815)  Labs: Recent Labs    10/02/24 0353 10/02/24 0954 10/03/24 0300 10/04/24 0344  HGB 9.7*  --  9.5* 9.7*  HCT 30.0*  --  29.1* 29.8*  PLT 165  --  177 208  HEPARINUNFRC 0.45 0.43 0.61 0.62  CREATININE 1.06  --  1.15  --     Estimated Creatinine Clearance: 59.8 mL/min (by C-G formula based on SCr of 1.15 mg/dL).  Assessment: 15 YOM admitted w/ severe vascular insufficiency who is tentatively scheduled for right axillary bifemoral bypass w/ possible right common femoral to popliteal bypass. No PTA AC noted through a search of his EMR. Of note, he had recent lumbar fusion on 09/08/2024. Consult for heparin  therapy received 09/22/24. Patient subsequently transferred from CIR to inpatient on 9/25.   Patient is s/p R calf lateral fasciotomy washout, debridement of skin and soft tissue on 9/29 and I&D on 10/1. Patient got I& D RLE wound, VAC application on 10/3, for which heparin  was held at 10:22 AM. Restarted ~1600 10/3.   Heparin  level this morning is slightly above goal range.  No overt bleeding or complications noted.  Goal of Therapy:  Heparin  level 0.3-0.5 units/ml Monitor platelets by anticoagulation protocol: Yes   Plan:  Decrease IV heparin  to 1800 units/hr. Check heparin  level daily while on heparin  Continue to monitor H&H and platelets  Thank you for allowing pharmacy to be a part of this patient's care.  Harlene Barlow, [employer], [facility] Clinical Pharmacist  10/04/2024 9:24 AM   [facility]  pharmacy phone numbers are listed on [email or web address]

## 2024-10-04 NOTE — Progress Notes (Signed)
  Progress Note    10/04/2024 7:27 AM 3 Days Post-Op  VAC was alarming yesterday evening. VAC changed and no further issues  Subjective:  no complaints   Vitals:   10/04/24 0032 10/04/24 0444  BP: 136/65 (!) 146/69  Pulse: 98 93  Resp: 15 14  Temp: 97.7 F (36.5 C) 98.3 F (36.8 C)  SpO2: 92% 94%   Physical Exam: Cardiac:  regular Lungs:  non labored Incisions:  right leg incision with VAC to suction. Some sanguinous drainage around honeycomb but seal intact Extremities: 2+ femoral pulses, Doppler Right Dp/PT, left  Pero/PT. Motor and sensation deficits unchanged right foot Neurologic: alert and oriented  CBC    Component Value Date/Time   WBC 13.9 (H) 10/04/2024 0344   RBC 3.13 (L) 10/04/2024 0344   HGB 9.7 (L) 10/04/2024 0344   HCT 29.8 (L) 10/04/2024 0344   PLT 208 10/04/2024 0344   MCV 95.2 10/04/2024 0344   MCH 31.0 10/04/2024 0344   MCHC 32.6 10/04/2024 0344   RDW 16.3 (H) 10/04/2024 0344   LYMPHSABS 1.1 09/29/2024 0355   MONOABS 0.5 09/29/2024 0355   EOSABS 0.2 09/29/2024 0355   BASOSABS 0.0 09/29/2024 0355    BMET    Component Value Date/Time   NA 134 (L) 10/03/2024 0300   K 4.1 10/03/2024 0300   CL 99 10/03/2024 0300   CO2 24 10/03/2024 0300   GLUCOSE 99 10/03/2024 0300   BUN 27 (H) 10/03/2024 0300   CREATININE 1.15 10/03/2024 0300   CREATININE 1.06 10/10/2020 0915   CALCIUM  8.7 (L) 10/03/2024 0300   GFRNONAA >60 10/03/2024 0300   GFRNONAA 73 10/10/2020 0915   GFRAA 84 10/10/2020 0915    INR    Component Value Date/Time   INR 1.2 08/22/2022 0833     Intake/Output Summary (Last 24 hours) at 10/04/2024 0727 Last data filed at 10/04/2024 0400 Gross per 24 hour  Intake 1140.64 ml  Output 2950 ml  Net -1809.36 ml     Assessment/Plan:  68 y.o. male is s/p #1: Right axillary bifemoral bypass graft                       #2: Bilateral common femoral, superficial femoral, and profundofemoral thrombectomy                       #3: Redo  left common femoral artery exposure                       #4: 4 compartment right leg fasciotomy                       #5: Resection of necrotic muscle in the right anterior compartment                       #6: Bilateral femoral endarterectomy  3 Days Post-Op   VAC in place to RLE. Good seal after replacement yesterday evening RLE remains well perfused and warm with doppler DP/PT signals Motor and sensation deficits remain in right foot Plan is for return to OR for washout and VAC change on Wednesday 10/8 with Dr. Harden 10 Continue Aspirin  and Statin  Continue PT/OT. Recommending CIR   DVT Prophylaxis: Hep gtt   Teretha Damme, PA-C [facility] [phone] 10/04/2024 7:27 AM

## 2024-10-04 NOTE — H&P (View-Only) (Signed)
 Patient ID: David King, male   DOB: [birth year], 68 y.o.   MRN: [id number] Patient is status post repeat debridement with Dr. Lanis on Friday.  Interoperative pictures shows stable tissue margins.  There is still ischemic tissue changes.  Plan for repeat debridement on Wednesday.

## 2024-10-04 NOTE — Progress Notes (Signed)
 Patient again evaluated at his bedside today.  His wound VAC is in place.  Continues to complain primarily of pain in his right heel.  He is sitting at the edge of his bed with therapy.  He continues to have lack of dorsiflexion and sensation at the dorsum of his right foot.  His lumbar wound continues to be well healed, and he does continue to report resolution of his preoperative left leg pain.  As he understands, his management will continue to be primarily overseen by the vascular team and Dr. Harden, and I will continue to follow his progress.

## 2024-10-04 NOTE — Progress Notes (Signed)
 Inpatient Rehab Admissions Coordinator:    CIR following at a distance, note plans to return to OR Wednesday. Will follow up after that.   Leita Kleine, MS, CCC-SLP Rehab Admissions Coordinator  [phone] (celll) [phone] (office)

## 2024-10-04 NOTE — Progress Notes (Signed)
 Patient ID: David King, male   DOB: [birth year], 68 y.o.   MRN: [id number] Patient is status post repeat debridement with Dr. Lanis on Friday.  Interoperative pictures shows stable tissue margins.  There is still ischemic tissue changes.  Plan for repeat debridement on Wednesday.

## 2024-10-04 NOTE — Progress Notes (Addendum)
 PROGRESS NOTE    David King  FMW:[id number] DOB: April 15, 1956 DOA: 09/24/2024 PCP: [name], MD    Brief Narrative: 68 y.o. male with PMH significant for HTN, HLD, retinal detachment, prostate cancer, PAD s/p b/l common [place] artery stenting, chronic back pain, lumbar radiculopathy.Because of progressive radiculopathy pain, patient was following up with orthopedics as an outpatient.  He had previous L4-L5 disc decompression.  Recent MRI showed severe stenosis of the left L4-L5 due to large disc herniation.   -- 09/08/2024, patient underwent revision L4-L5 decompression, lumbar body fusion by Dr. Beuford.  During the procedure, patient had an anaphylactic reaction to an unknown agent and developed hives with some hypotension. He required short-term intubation and transferred to ICU, post recovery he was noted to have some improvement in his left leg pain, postop site looked okay but he developed some right lower extremity weakness.  Neurology was consulted.  MRI brain was unremarkable.  EMG showed nonspecific changes.  -- 9/17, discharged to CIR. 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 -- 9/26, patient was readmitted to acute care with a plan of revascularization.  Patient underwent right axillary femoral bypass graft, bilateral common femoral, superficial femoral and profundofemoral thrombectomy, right leg anterior compartment fasciotomy due to compartment syndrome, right femoral endarterectomy. Soon after coming out of the OR, he was found to have decrease in his right lower extremity pulses again. Seen by vascular surgery, taken to the OR again.  Underwent right leg angiogram, found to have thrombus in the popliteal artery, underwent successful thrombectomy. Postop, admitted to  [facility] -- 9/29, underwent right calf lateral fasciotomy washout, skin debridement and wound VAC placement.  -09/28/2024 right calf lateral fasciotomy without any skin debridement and wound VAC placement - 09/29/2024 excisional debridement skin and soft tissue muscle and fascia anterior compartment of the right leg transfer of the lateral compartment muscles to the anterior compartment to cover the anterior tibial vessels.  Dr. 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  But right foot with no motor or sensory function.   09/29/2024 Dr. Harden S/P  Excisional debridement skin soft tissue muscle and fascia anterior compartment right leg.  Transfer of the lateral compartment muscles to the anterior compartment to cover the anterior tibial vessels.  Repeat debridement 10/01/2024 with vascular status post I&D and excisional debridement Dr. Harden to do serial debridement and change of vac Wednesday 10/8  He still does not have sensation in his right foot despite  revascularization.  Lumbar radiculopathy S/p recent L4-L5 decompression/fusion -9/10 Dr Beuford Continue morphine  and OxyContin  and oxycodone  Lyrica    Acute blood loss anemia-hemoglobin dropped as low as 6.8 status post 1 unit of packed RBC now stable at 9.5  AKI Resolved  with IV hydration.    Acute urinary retention Foul-smelling urine H/o prostate cancer he had Foley catheter inserted on admission.  This was removed on 27 September.  Since then he required multiple In-N-Out catheterization.  With routine reminders he is able to urinate without having a Foley in place.  Continue Flomax .   Hyponatremia Mild.  Continue to monitor.improving   Hyperlipidemia Crestor  20 mg daily   COPD Bronchodilators as needed   Diverticulosis with constipation he has not had a bowel movement in multiple days he has been on Colace senna Dulcolax and Dulcolax suppository and lactulose KUB today 10/04/2024  Anxiety/depression Celexa  20 mg daily, Cymbalta  nightly, Ambien  nightly as needed  Pressure injury bilateral buttocks and right heel deep tissue pressure injury seen by wound care.  Severe malnutrition due to acute illness as evidenced by moderate muscle depletion mild fat depletion.  Dietary consulted continue Ensure mighty shake Juven twice daily multivitamin.  Wound 09/15/24 2032 Pressure Injury Buttocks Bilateral;Right Deep Tissue Pressure Injury - Purple or maroon localized area of discolored intact skin or blood-filled blister due to damage of underlying soft tissue from pressure and/or shear. (Active)     Wound 09/15/24 1848 Pressure Injury Heel Right Deep Tissue Pressure Injury - Purple or maroon localized area of discolored intact skin or blood-filled blister due to damage of underlying soft tissue from pressure and/or shear. (Active)     Estimated body mass index is 27.09 kg/m as calculated from the following:   Height as of this encounter: 5' 9 (1.753 m).   Weight as of this  encounter: 83.2 kg.  DVT prophylaxis: heparin  Code Status: full Family Communication: none Disposition Plan:  Status is: Inpatient Remains inpatient appropriate because:    Consultants: ortho vascular   Procedures: see above Antimicrobials:none  Subjective:   Resting in bed no BM for many days he is having flatus denies nausea vomiting  Dr. Harden in to see patient for repeat debridement on Wednesday Objective: Vitals:   10/03/24 1951 10/04/24 0032 10/04/24 0444 10/04/24 0815  BP: 126/64 136/65 (!) 146/69 128/75  Pulse: 97 98 93 88  Resp: 15 15 14 13   Temp: 97.9 F (36.6 C) 97.7 F (36.5 C) 98.3 F (36.8 C) 97.9 F (36.6 C)  TempSrc: Oral Oral Oral Oral  SpO2: 93% 92% 94% 94%  Weight:      Height:        Intake/Output Summary (Last 24 hours) at 10/04/2024 1517 Last data filed at 10/04/2024 1505 Gross per 24 hour  Intake 1108.27 ml  Output 2500 ml  Net -1391.73 ml   Filed Weights   09/29/24 1049 10/01/24 1038  Weight: 83.2 kg 83.2 kg    Examination:  General exam: Appears in nad Respiratory system: Clear to auscultation. Respiratory effort normal. Cardiovascular system:reg Gastrointestinal system: Abdomen is distended, soft and nontender. No organomegaly or masses felt. Normal bowel sounds heard. Central nervous system: Alert and oriented. No focal neurological deficits. Extremities: rle with vac   Data Reviewed: I have personally reviewed following labs and imaging studies  CBC: Recent Labs  Lab 09/28/24 0648 09/29/24 0355 09/30/24 0505 09/30/24 1531 10/01/24 0510 10/02/24 0353 10/03/24 0300 10/04/24 0344  WBC 5.3 5.1   < > 8.7 8.7 9.3 12.3* 13.9*  NEUTROABS 4.2 3.1  --   --   --   --   --   --   HGB 7.3* 9.4*   < > 9.9* 9.1* 9.7* 9.5* 9.7*  HCT 22.5* 27.9*   < > 30.3* 27.3* 30.0* 29.1* 29.8*  MCV 95.7 92.4   < > 93.5 93.2 95.8 96.4 95.2  PLT 126* 134*   < > 150 153 165 177 208   < > = values in this interval not displayed.   Basic Metabolic  Panel: Recent Labs  Lab 09/28/24 0648 09/29/24 0355 09/30/24 0505 09/30/24 1531 10/01/24 0510 10/02/24 0353 10/03/24 0300  NA 131*   < > 132* 135 137 134* 134*  K 4.4   < > 4.0 3.7 3.6 4.2 4.1  CL 98   < > 99 101 102 102 99  CO2 23   < > 24 22 25 23 24   GLUCOSE 114*   < > 105* 116* 93 113* 99  BUN 15   < > 13 16 13 16  27*  CREATININE 1.04   < > 1.09 1.21 1.07 1.06 1.15  CALCIUM  7.6*   < > 8.3* 8.2* 8.5* 8.5* 8.7*  MG 2.1  --   --   --   --   --   --   PHOS 3.6  --   --   --   --   --   --    < > = values in this interval not displayed.   GFR: Estimated Creatinine Clearance: 59.8 mL/min (by C-G formula based on SCr of 1.15 mg/dL). Liver Function Tests: Recent Labs  Lab 09/28/24 0648 09/29/24 0355 09/30/24 1531  AST 51* 59* 45*  ALT 56* 70* 64*  ALKPHOS 83 93 90  BILITOT 1.0 1.1 0.8  PROT 5.1* 5.4* 5.7*  ALBUMIN  2.2* 2.3* 2.5*   No results for input(s): LIPASE, AMYLASE in the last 168 hours. No results for input(s): AMMONIA in the last 168 hours. Coagulation Profile: No results for input(s): INR, PROTIME in the last 168 hours. Cardiac Enzymes: No results for input(s): CKTOTAL, CKMB, CKMBINDEX, TROPONINI in the last 168 hours.  BNP (last 3 results) No results for input(s): PROBNP in the last 8760 hours. HbA1C: No results for input(s): HGBA1C in the last 72 hours. CBG: Recent Labs  Lab 10/01/24 1628  GLUCAP 131*   Lipid Profile: No results for input(s): CHOL, HDL, LDLCALC, TRIG, CHOLHDL, LDLDIRECT in the last 72 hours. Thyroid Function Tests: No results for input(s): TSH, T4TOTAL, FREET4, T3FREE, THYROIDAB in the last 72 hours. Anemia Panel: No results for input(s): VITAMINB12, FOLATE, FERRITIN, TIBC, IRON, RETICCTPCT in the last 72 hours. Sepsis Labs: No results for input(s): PROCALCITON, LATICACIDVEN in the last 168 hours.  No results found for this or any previous visit (from the past 240 hours).     Radiology Studies: No results found.  Scheduled Meds:  aspirin  EC  81 mg Oral Daily   citalopram   20 mg Oral Daily   docusate sodium   100 mg Oral BID   DULoxetine   30 mg Oral QHS   feeding supplement  237 mL Oral BID BM   lactulose  20 g Oral BID   multivitamin with minerals  1 tablet Oral Daily   nutrition supplement (JUVEN)  1 packet Oral BID BM   oxyCODONE   10 mg Oral Q12H   polyethylene glycol  17 g Oral BID   pregabalin   150 mg Oral TID   rosuvastatin   20 mg  Oral Daily   senna-docusate  3 tablet Oral QHS   tamsulosin   0.4 mg Oral QPC supper   thiamine  100 mg Oral Daily   Continuous Infusions:  heparin  1,800 Units/hr (10/04/24 1456)     LOS: 10 days    Almarie KANDICE Hoots, MD 10/04/2024, 3:17 PM

## 2024-10-04 NOTE — Progress Notes (Signed)
 Physical Therapy Treatment Patient Details Name: David King MRN: [id number] DOB: 1956-05-14 Today's Date: 10/04/2024   History of Present Illness 68 y.o. 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 Pt went to OR x2 on 9/26 initially for R axial bypass, bilateral femoral thrombectomy and RLE fasciotomy and later for redo RLE thrombectomy. Pt recently underwent L4-5 decompression and fusion on 9/10 with anaphylactic reaction during surgery requiring ICU stay. PMHx: COPD, CAD, PAD, tobacco use, lumbar radiculopathy, detached retina L eye    PT Comments  Pt admitted with above diagnosis. Pt was able to transfer to chair with mod assist of 2 persons with use of RW. Pt with drop in BP with transfer however BP recovered quickly.  Pt was able to stay in chair. Tolerated transfer a little better today.  STill has difficulty moving right LE.Encouraged ankle pumps right foot and movement as tolerated. Pt agrees.  Will continue to follow acutely.  Pt currently with functional limitations due to the deficits listed below (see PT Problem List). Pt will benefit from acute skilled PT to increase their independence and safety with mobility to allow discharge.       If plan is discharge home, recommend the following: A lot of help with walking and/or transfers;Assistance with cooking/housework;Assist for transportation;Help with stairs or ramp for entrance   Can travel by private vehicle        Equipment Recommendations  Wheelchair (measurements PT);Wheelchair cushion (measurements PT);BSC/3in1    Recommendations for Other Services Rehab consult     Precautions / Restrictions Precautions Precautions: Fall;Back;Other (comment) Precaution Booklet Issued: Yes (comment) Recall of Precautions/Restrictions: Intact Precaution/Restrictions Comments: watch BP (orthostatic); RLE foot  drop Required Braces or Orthoses: Spinal Brace Spinal Brace: Thoracolumbosacral orthotic;Applied in sitting position Restrictions Weight Bearing Restrictions Per Provider Order: No Other Position/Activity Restrictions: transfers only     Mobility  Bed Mobility Overal bed mobility: Needs Assistance Bed Mobility: Rolling, Sidelying to Sit, Sit to Sidelying Rolling: Supervision Sidelying to sit: Supervision       General bed mobility comments: S for safety and line management. Pt able to don brace sitting EOB.    Transfers Overall transfer level: Needs assistance Equipment used: Rolling walker (2 wheels) Transfers: Sit to/from Stand, Bed to chair/wheelchair/BSC Sit to Stand: Mod assist, +2 physical assistance, +2 safety/equipment, From elevated surface Stand pivot transfers: Mod assist, +2 physical assistance, +2 safety/equipment         General transfer comment: Pt was Mod A of 2 for sit to stand. Pt able to get fully to standing with little weight through RLE and with heavy UE support through RW. Pt was Mod assist of 2 for stand pivot to the L with verbal cues for sequencing steps and RW with abrupt sit once to chair with pt stating left knee gave out.  Needed mod assist to lower to chair. BP did drop as it was initially 126/81 and dropped to 69/59 with pt reporting dizziness.  REclined pt and BP stabilized to 124/144.    Ambulation/Gait                   Stairs             Wheelchair Mobility     Tilt Bed    Modified Rankin (Stroke Patients Only)       Balance Overall balance assessment: Needs assistance Sitting-balance support: Feet  supported, No upper extremity supported Sitting balance-Leahy Scale: Fair     Standing balance support: Bilateral upper extremity supported, Reliant on assistive device for balance Standing balance-Leahy Scale: Poor Standing balance comment: reliant on AD and external support.                             Communication Communication Communication: No apparent difficulties  Cognition Arousal: Alert Behavior During Therapy: WFL for tasks assessed/performed   PT - Cognitive impairments: No apparent impairments                         Following commands: Intact Following commands impaired: Only follows one step commands consistently    Cueing Cueing Techniques: Verbal cues, Tactile cues  Exercises Other Exercises Other Exercises: seated LAQ x 5 reps    General Comments General comments (skin integrity, edema, etc.): VAC in place right LE      Pertinent Vitals/Pain Pain Assessment Pain Assessment: Faces Faces Pain Scale: Hurts whole lot Pain Location: RLE Pain Descriptors / Indicators: Discomfort, Grimacing Pain Intervention(s): Limited activity within patient's tolerance, Monitored during session, Repositioned    Home Living                          Prior Function            PT Goals (current goals can now be found in the care plan section) Acute Rehab PT Goals Patient Stated Goal: to return to independence and reduce pain Progress towards PT goals: Progressing toward goals    Frequency    Min 3X/week      PT Plan      Co-evaluation              AM-PAC PT 6 Clicks Mobility   Outcome Measure  Help needed turning from your back to your side while in a flat bed without using bedrails?: A Little Help needed moving from lying on your back to sitting on the side of a flat bed without using bedrails?: A Little Help needed moving to and from a bed to a chair (including a wheelchair)?: Total Help needed standing up from a chair using your arms (e.g., wheelchair or bedside chair)?: Total Help needed to walk in hospital room?: Total Help needed climbing 3-5 steps with a railing? : Total 6 Click Score: 10    End of Session Equipment Utilized During Treatment: Back brace;Gait belt Activity Tolerance: Patient tolerated treatment well;No  increased pain;Patient limited by fatigue Patient left: in chair;with call bell/phone within reach;with chair alarm set Nurse Communication: Mobility status;Need for lift equipment PT Visit Diagnosis: Other abnormalities of gait and mobility (R26.89);Muscle weakness (generalized) (M62.81);Other symptoms and signs involving the nervous system (R29.898)     Time: 1010-1038 PT Time Calculation (min) (ACUTE ONLY): 28 min  Charges:    $Therapeutic Activity: 23-37 mins PT General Charges $$ ACUTE PT VISIT: 1 Visit                     Fue Cervenka M,PT Acute Rehab Services [phone]    Stephane JULIANNA Bevel 10/04/2024, 11:52 AM

## 2024-10-05 LAB — CBC
HCT: 32.1 % — ABNORMAL LOW (ref 39.0–52.0)
Hemoglobin: 10.4 g/dL — ABNORMAL LOW (ref 13.0–17.0)
MCH: 31 pg (ref 26.0–34.0)
MCHC: 32.4 g/dL (ref 30.0–36.0)
MCV: 95.5 fL (ref 80.0–100.0)
Platelets: 241 K/uL (ref 150–400)
RBC: 3.36 MIL/uL — ABNORMAL LOW (ref 4.22–5.81)
RDW: 16.3 % — ABNORMAL HIGH (ref 11.5–15.5)
WBC: 18.6 K/uL — ABNORMAL HIGH (ref 4.0–10.5)
nRBC: 0 % (ref 0.0–0.2)

## 2024-10-05 LAB — HEPARIN LEVEL (UNFRACTIONATED)
Heparin Unfractionated: 0.56 [IU]/mL (ref 0.30–0.70)
Heparin Unfractionated: 0.5 [IU]/mL (ref 0.30–0.70)

## 2024-10-05 LAB — VITAMIN C: Vitamin C: 0.5 mg/dL (ref 0.4–2.0)

## 2024-10-05 LAB — MRSA NEXT GEN BY PCR, NASAL: MRSA by PCR Next Gen: NOT DETECTED

## 2024-10-05 MED ORDER — VANCOMYCIN HCL IN DEXTROSE 1-5 GM/200ML-% IV SOLN
1000.0000 mg | INTRAVENOUS | Status: AC
  Administered 2024-10-06: 1000 mg via INTRAVENOUS
  Filled 2024-10-05: qty 200

## 2024-10-05 MED ORDER — ALUM & MAG HYDROXIDE-SIMETH 200-200-20 MG/5ML PO SUSP: 30.0000 mL | Freq: Four times a day (QID) | ORAL | Status: DC | PRN

## 2024-10-05 MED ORDER — LEVOFLOXACIN IN D5W 500 MG/100ML IV SOLN
500.0000 mg | INTRAVENOUS | Status: AC
  Administered 2024-10-06: 500 mg via INTRAVENOUS
  Filled 2024-10-05 (×2): qty 100

## 2024-10-05 MED ORDER — LOPERAMIDE HCL 2 MG PO CAPS
2.0000 mg | ORAL_CAPSULE | ORAL | Status: DC | PRN
  Administered 2024-10-05 (×2): 2 mg via ORAL
  Filled 2024-10-05 (×2): qty 1

## 2024-10-05 MED ORDER — CHLORHEXIDINE GLUCONATE 4 % EX SOLN
60.0000 mL | Freq: Once | CUTANEOUS | Status: AC
Start: 2024-10-05 — End: 2024-10-06
  Administered 2024-10-06: 4 via TOPICAL
  Filled 2024-10-05: qty 60

## 2024-10-05 MED ORDER — HYDRALAZINE HCL 20 MG/ML IJ SOLN
5.0000 mg | Freq: Once | INTRAMUSCULAR | Status: AC
Start: 2024-10-05 — End: 2024-10-05
  Administered 2024-10-05: 5 mg via INTRAVENOUS
  Filled 2024-10-05: qty 1

## 2024-10-05 MED ORDER — POVIDONE-IODINE 10 % EX SWAB
2.0000 | Freq: Once | CUTANEOUS | Status: AC
Start: 2024-10-05 — End: 2024-10-06
  Administered 2024-10-06: 2 via TOPICAL

## 2024-10-05 NOTE — Progress Notes (Signed)
 PROGRESS NOTE    David King  FMW:[id number] DOB: 1956-08-22 DOA: 09/24/2024 PCP: [name], MD   Brief Narrative:    68 y.o. male with PMH significant for HTN, HLD, retinal detachment, prostate cancer, PAD s/p b/l common Ilac artery stenting, chronic back pain, lumbar radiculopathy.Because of progressive radiculopathy pain, patient was following up with orthopedics as an outpatient.  He had previous L4-L5 disc decompression.  Recent MRI showed severe stenosis of the left L4-L5 due to large disc herniation.   -- 09/08/2024, patient underwent revision L4-L5 decompression, lumbar body fusion by Dr. Beuford.  During the procedure, patient had an anaphylactic reaction to an unknown agent and developed hives with some hypotension. He required short-term intubation and transferred to ICU, post recovery he was noted to have some improvement in his left leg pain, postop site looked okay but he developed some right lower extremity weakness.  Neurology was consulted.  MRI brain was unremarkable.  EMG showed nonspecific changes.  -- 9/17, discharged to CIR. 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 -- 9/26, patient was readmitted to acute care with a plan of revascularization.  Patient underwent right axillary femoral bypass graft, bilateral common femoral, superficial femoral and profundofemoral thrombectomy, right leg anterior compartment fasciotomy due to compartment syndrome, right femoral endarterectomy. Soon after coming out of the OR, he was found to have decrease in his right lower extremity pulses again. Seen by vascular surgery, taken to the OR again.  Underwent right leg angiogram, found to have thrombus in the popliteal artery, underwent successful thrombectomy. Postop, admitted  to [facility] -- 9/29, underwent right calf lateral fasciotomy washout, skin debridement and wound VAC placement.  -09/28/2024 right calf lateral fasciotomy without any skin debridement and wound VAC placement - 09/29/2024 excisional debridement skin and soft tissue muscle and fascia anterior compartment of the right leg transfer of the lateral compartment muscles to the anterior compartment to cover the anterior tibial vessels.  Dr. 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  But right foot with no motor or sensory function.   09/29/2024 Dr. Harden S/P  Excisional debridement skin soft tissue muscle and fascia anterior compartment right leg.  Transfer of the lateral compartment muscles to the anterior compartment to cover the anterior tibial vessels.  Repeat debridement 10/01/2024 with vascular status post I&D and excisional debridement Dr. Harden to do serial debridement and change of vac Wednesday 10/8  He still does not have sensation in his right foot despite  revascularization.   Lumbar radiculopathy S/p recent L4-L5 decompression/fusion -9/10 Dr Beuford Continue morphine  and OxyContin  and oxycodone  Lyrica    Acute blood loss anemia-hemoglobin dropped as low as 6.8 status post 1 unit of packed RBC now stable at 9.5   AKI Resolved  with IV hydration.     Acute urinary retention Foul-smelling urine H/o prostate cancer he had Foley catheter inserted on admission.  This was removed on 27 September.  Since then he required multiple In-N-Out catheterization.  With routine reminders he is able to urinate without having a Foley in place.  Continue Flomax .   Hyponatremia Mild.  Continue to monitor.   Hyperlipidemia Crestor  20 mg daily   COPD Bronchodilators as needed   Diverticulosis with constipation he has not had a bowel movement in multiple days he has been on Colace senna Dulcolax and Dulcolax suppository and lactulose KUB done on 10/04/2024 showed no bowel obstruction.   Anxiety/Major moderate depression Celexa  20 mg daily, Cymbalta  nightly, Ambien  nightly as needed   Pressure injury bilateral buttocks and right heel deep tissue pressure injury seen by wound care.   Severe malnutrition due to acute illness as evidenced by moderate muscle depletion mild fat depletion.  Dietary consulted continue Ensure mighty shake Juven twice daily multivitamin.  DVT prophylaxis:      Code Status: Full Code Family Communication:  None at the bedside Status is: Inpatient Remains inpatient appropriate because: LE debridements    Subjective:  Denied any active compaints. He is aware that he is going to have another debridement procedure done by Dr Harden tomorrow.  Examination:  General exam: Appears calm and comfortable  Respiratory system: Clear to auscultation. Respiratory effort normal. Cardiovascular system: S1 & S2 heard, RRR. No JVD, murmurs, rubs, gallops or clicks. No pedal edema. Gastrointestinal system: Abdomen is nondistended, soft  and nontender. No organomegaly or masses felt. Normal bowel sounds heard. Central nervous system: Alert and oriented. No focal neurological deficits. Extremities: RLE dressing and wound vac x 2 in place   Wound 09/15/24 2032 Pressure Injury Buttocks Bilateral;Right Deep Tissue Pressure Injury - Purple or maroon localized area of discolored intact skin or blood-filled blister due to damage of underlying soft tissue from pressure and/or shear. (Active)     Wound 09/15/24 1848 Pressure Injury Heel Right Deep Tissue Pressure Injury - Purple or maroon localized area of discolored intact skin or blood-filled blister due to damage of underlying soft tissue from pressure and/or shear. (Active)     Diet Orders (From admission, onward)     Start     Ordered   10/02/24 0822  Diet regular Room service appropriate? Yes; Fluid consistency: Thin  Diet effective now       Question Answer Comment  Room service appropriate? Yes   Fluid consistency: Thin      10/02/24 0821            Objective: Vitals:   10/04/24 1942 10/04/24 2335 10/05/24 0308 10/05/24 0801  BP: 134/80 121/61 139/68 (!) 117/93  Pulse:    100  Resp: 19 17 20 15   Temp: 97.8 F (36.6 C) 98.7 F (37.1 C) 98.3 F (36.8 C) 98.2 F (36.8 C)  TempSrc: Oral Oral Oral Oral  SpO2: 96% 90% 95% (!) 87%  Weight:      Height:        Intake/Output Summary (Last 24 hours) at 10/05/2024 1042 Last data filed at 10/04/2024 1505 Gross per 24 hour  Intake 207.63 ml  Output --  Net  207.63 ml   Filed Weights   09/29/24 1049 10/01/24 1038  Weight: 83.2 kg 83.2 kg    Scheduled Meds:  aspirin  EC  81 mg Oral Daily   citalopram   20 mg Oral Daily   docusate sodium   100 mg Oral BID   DULoxetine   30 mg Oral QHS   feeding supplement  237 mL Oral BID BM   multivitamin with minerals  1 tablet Oral Daily   nutrition supplement (JUVEN)  1 packet Oral BID BM   oxyCODONE   10 mg Oral Q12H   polyethylene glycol  17 g Oral BID   pregabalin   150  mg Oral TID   rosuvastatin   20 mg Oral Daily   senna-docusate  3 tablet Oral QHS   SMOG  300 mL Rectal Once   tamsulosin   0.4 mg Oral QPC supper   thiamine  100 mg Oral Daily   Continuous Infusions:  heparin  1,750 Units/hr (10/05/24 0824)    Nutritional status Signs/Symptoms: moderate muscle depletion, mild fat depletion, energy intake < or equal to 50% for > or equal to 5 days Interventions: Refer to RD note for recommendations Body mass index is 27.09 kg/m.  Data Reviewed:   CBC: Recent Labs  Lab 09/29/24 0355 09/30/24 0505 10/01/24 0510 10/02/24 0353 10/03/24 0300 10/04/24 0344 10/05/24 0354  WBC 5.1   < > 8.7 9.3 12.3* 13.9* 18.6*  NEUTROABS 3.1  --   --   --   --   --   --   HGB 9.4*   < > 9.1* 9.7* 9.5* 9.7* 10.4*  HCT 27.9*   < > 27.3* 30.0* 29.1* 29.8* 32.1*  MCV 92.4   < > 93.2 95.8 96.4 95.2 95.5  PLT 134*   < > 153 165 177 208 241   < > = values in this interval not displayed.   Basic Metabolic Panel: Recent Labs  Lab 09/30/24 0505 09/30/24 1531 10/01/24 0510 10/02/24 0353 10/03/24 0300  NA 132* 135 137 134* 134*  K 4.0 3.7 3.6 4.2 4.1  CL 99 101 102 102 99  CO2 24 22 25 23 24   GLUCOSE 105* 116* 93 113* 99  BUN 13 16 13 16  27*  CREATININE 1.09 1.21 1.07 1.06 1.15  CALCIUM  8.3* 8.2* 8.5* 8.5* 8.7*   GFR: Estimated Creatinine Clearance: 59.8 mL/min (by C-G formula based on SCr of 1.15 mg/dL). Liver Function Tests: Recent Labs  Lab 09/29/24 0355 09/30/24 1531  AST 59* 45*  ALT 70* 64*  ALKPHOS 93 90  BILITOT 1.1 0.8  PROT 5.4* 5.7*  ALBUMIN  2.3* 2.5*   No results for input(s): LIPASE, AMYLASE in the last 168 hours. No results for input(s): AMMONIA in the last 168 hours. Coagulation Profile: No results for input(s): INR, PROTIME in the last 168 hours. Cardiac Enzymes: No results for input(s): CKTOTAL, CKMB, CKMBINDEX, TROPONINI in the last 168 hours. BNP (last 3 results) No results for input(s): PROBNP in the last  8760 hours. HbA1C: No results for input(s): HGBA1C in the last 72 hours. CBG: Recent Labs  Lab 10/01/24 1628  GLUCAP 131*   Lipid Profile: No results for input(s): CHOL, HDL, LDLCALC, TRIG, CHOLHDL, LDLDIRECT in the last 72 hours. Thyroid Function Tests: No results for input(s): TSH, T4TOTAL, FREET4, T3FREE, THYROIDAB in the last 72 hours. Anemia Panel: No results for input(s): VITAMINB12, FOLATE, FERRITIN, TIBC, IRON, RETICCTPCT in the last 72 hours. Sepsis Labs: No results for input(s): PROCALCITON, LATICACIDVEN in the last 168 hours.  No results found for this or any previous visit (from the past 240 hours).       Radiology Studies: DG Abd 1 View Result Date: 10/04/2024 CLINICAL DATA:  Abdominal pain. EXAM: ABDOMEN - 1 VIEW COMPARISON:  CT 09/23/2019 FINDINGS: No bowel dilatation or evidence of obstruction. Moderate volume of stool in the colon. Iliac vascular stents in place. Left renal calculi are obscured by overlying bowel gas. Lumbar fusion hardware. IMPRESSION: No bowel obstruction. Moderate colonic stool burden. Electronically Signed   By: Andrea Gasman M.D.   On: 10/04/2024 19:13           LOS: 11 days   Time spent= 39 mins    Deliliah Room, MD [facility]  If 7PM-7AM, please contact night-coverage  10/05/2024, 10:42 AM

## 2024-10-05 NOTE — Progress Notes (Signed)
 PHARMACY - ANTICOAGULATION CONSULT NOTE  Pharmacy Consult for Heparin  Indication:  arterial insufficiency / occlusion  Allergies  Allergen Reactions   Ancef  [Cefazolin ] Anaphylaxis    Patient Measurements: Height: 5' 9 (175.3 cm) Weight: 83.2 kg (183 lb 6.8 oz) IBW/kg (Calculated) : 70.7 HEPARIN  DW (KG): 83.2 Heparin  dosing weight = 83 kg  Vital Signs: Temp: 98.2 F (36.8 C) (10/07 0801) Temp Source: Oral (10/07 0801) BP: 117/93 (10/07 0801) Pulse Rate: 100 (10/07 0801)  Labs: Recent Labs    10/03/24 0300 10/04/24 0344 10/05/24 0354  HGB 9.5* 9.7* 10.4*  HCT 29.1* 29.8* 32.1*  PLT 177 208 241  HEPARINUNFRC 0.61 0.62 0.56  CREATININE 1.15  --   --     Estimated Creatinine Clearance: 59.8 mL/min (by C-G formula based on SCr of 1.15 mg/dL).  Assessment: 66 YOM admitted w/ severe vascular insufficiency s/p right axillary bifemoral bypass w/ right common femoral to popliteal bypass. No PTA AC noted through a search of his EMR. Pharmacy consulted to start heparin  therapy.   Patient is s/p R calf lateral fasciotomy washout, debridement of skin and soft tissue on 9/29 and I&D on 10/1. Repeat I&D tentatively planned for 10/8.  Heparin  level this morning is above goal range. No overt bleeding or complications noted.  Goal of Therapy:  Heparin  level 0.3-0.5 units/ml Monitor platelets by anticoagulation protocol: Yes   Plan:  Decrease heparin  infusion to 1750 units/hr Check heparin  level in 8 hours and daily while on heparin  Continue to monitor H&H and platelets  Thank you for allowing pharmacy to be a part of this patient's care.  Shelba Collier, PharmD, BCPS Clinical Pharmacist

## 2024-10-05 NOTE — Progress Notes (Signed)
 Nutrition Follow-up  DOCUMENTATION CODES:  Severe malnutrition in context of acute illness/injury  INTERVENTION:  Continue regular diet as ordered Ordering with assistance Consider SLP evaluation  Continue Ensure Plus High Protein po BID, each supplement provides 350 kcal and 20 grams of protein. Mighty Shake TID with meals, each supplement provides 330 kcals and 9 grams of protein Juven BID to support wound healing MVI with minerals daily Request updated measured weight Encouraged pt to consider tube feeding via Cortrak given poor nutritional status and necessity of increased nutrition needs to support healing; pt reluctant to agree to this; discussed with MD Could consider addition of appetite stimulant however these often take weeks to take effect and come with risk  NUTRITION DIAGNOSIS:  Severe Malnutrition related to acute illness as evidenced by moderate muscle depletion, mild fat depletion, energy intake < or equal to 50% for > or equal to 5 days. - remains applicable   GOAL:  Patient will meet greater than or equal to 90% of their needs - goal unmet  MONITOR:  PO intake, Diet advancement, Supplement acceptance, Skin, Labs  REASON FOR ASSESSMENT:  LOS, NPO/Clear Liquid Diet    ASSESSMENT:  68 y.o. male with prolonged hospital stay coming from CIR for aortic occulusion s/p  R axillobifemoral bypass graft.  PMH significant for HTN, HLD, PAD s/p b/l common Ilac artery stenting, prostate cancer, COPD, diverticulosis, chronic back pain, lumbar radiculopathy, s/p L4-5 decompression and fusion, detached retina L eye.  9/10 - L4-5 decompression and fusion, had anaphylactic reaction during surgery requiring ICU stay, short term intubation 9/17 - Transferred to CIR  9/26 - Admitted from CIR, s/p R axial bypass, bilateral femoral thrombectomy and RLE fasciotomy and later for redo RLE thrombectomy.  9/29 - underwent right calf lateral fasciotomy washout, skin debridement and wound  VAC placement.  10/3 - repeat I & D with excisional debridement., wound vac placement on right leg  Ortho planning for serial debridement and VAC change tomorrow.   KUB yesterday obtained d/t constipation, no bowel dilation or evidence of obstruction on imaging. Given bowel regimen and enema. Now having multiple loose stools requiring a dose of PRN imodium today.   Checked in with patient at bedside. He appears somewhat drowsy at time of visit. Observed pt consuming a sip of water . It appeared he was having difficulty initiating swallow followed by coughing episode. Pt states that this is a new problem today.   Pt reports that he is not eating very well d/t poor appetite and missing meals d/t sleeping several hours at a time. He denies receiving food from outside hospital.  Spoke with RN outside room before and after visit. She reports that pt has missed some meals which she questions is r/t some confusion (flowsheet reflects pt A/Ox4). In addition he has only consumed very small amounts on days that she has been with him. This morning he has a bit of french toast and sausage. A couple nights ago he only consumed some chicken soup.   Meal completions: 10/2:  0% breakfast, 0% lunch 10/4: 100% breakfast, 100% dinner 10/5: 100% breakfast   Concerned for pt's nutritional status given malnutrition, frequent NPO d/t repeat I&D and very poor appetite. Attempted to elicit pt thoughts on Cortrak for supplemental nutrition support. He states that he had this during prior admission and removed tube and refused replacement. Per review of chart, pt had NGT to suction with TPN in 2023 d/t altered GI function.   Medications: colace BID, MVI, miralax   BID, senna, thiamine  Labs: last updated 10/5  Micronutrient labs: CRP 6.4 Vitamin D 51.12 Vitamin C  0.5 Zinc  66  Diet Order:   Diet Order             Diet NPO time specified  Diet effective midnight           Diet regular Room service appropriate?  Yes with Assist; Fluid consistency: Thin  Diet effective now                   EDUCATION NEEDS:   Education needs have been addressed  Skin:  Skin Assessment: Skin Integrity Issues: Skin Integrity Issues:: Wound VAC, DTI, Incisions, Other (Comment) DTI: Right buttocks, Right heel Wound Vac: Right leg Incisions: Closed surgical incision groin, chest, back, right leg Other: R toe wound  Last BM:  10/7 type 7 x3  Height:   Ht Readings from Last 1 Encounters:  10/01/24 5' 9 (1.753 m)    Weight:   Wt Readings from Last 1 Encounters:  10/01/24 83.2 kg    Ideal Body Weight:  72.7 kg  BMI:  Body mass index is 27.09 kg/m.  Estimated Nutritional Needs:   Kcal:  2200-2400 kcal  Protein:  130-150 gm  Fluid:  >2L/day  Royce Maris, RDN, LDN Clinical Nutrition See AMiON for contact information.

## 2024-10-05 NOTE — Plan of Care (Signed)
  Problem: Nutrition: Goal: Adequate nutrition will be maintained Outcome: Progressing   Problem: Pain Managment: Goal: General experience of comfort will improve and/or be controlled Outcome: Progressing   Problem: Safety: Goal: Ability to remain free from injury will improve Outcome: Progressing   Problem: Skin Integrity: Goal: Risk for impaired skin integrity will decrease Outcome: Progressing   Problem: Education: Goal: Knowledge of prescribed regimen will improve Outcome: Progressing   Problem: Activity: Goal: Ability to tolerate increased activity will improve Outcome: Progressing   Problem: Bowel/Gastric: Goal: Gastrointestinal status for postoperative course will improve Outcome: Progressing

## 2024-10-05 NOTE — Progress Notes (Signed)
 Physical Therapy Treatment Patient Details Name: David King MRN: [id number] DOB: 10/05/1956 Today's Date: 10/05/2024   History of Present Illness 68 y.o. 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 Pt went to OR x2 on 9/26 initially for R axial bypass, bilateral femoral thrombectomy and RLE fasciotomy and later for redo RLE thrombectomy. Pt recently underwent L4-5 decompression and fusion on 9/10 with anaphylactic reaction during surgery requiring ICU stay.  09/29/2024 excisional debridement skin and soft tissue muscle and fascia anterior compartment of the right leg transfer of the lateral compartment muscles to the anterior compartment to cover the anterior tibial vessels.  Dr. Harden  10/02/2019 5 repeat debridement with vascular I&D and excisional debridement  Plan for serial debridement again on October 8. PMHx: COPD, CAD, PAD, tobacco use, lumbar radiculopathy, detached retina L eye    PT Comments  Pt received in supine, lethargic but agreeable to therapy session. Pt air bed malfunctioning per pt/staff so pt agreeable to work on OOB to chair transfer while air beds switched out. Pt needing up to modA +2 to perform sit<>stand transfer from elevated bed and Stedy flaps surfaces. Pt attempted STS to RW but unable to fully extend bil knees at RW due to c/o RLE pain and weakness. Pt agreeable to try sitting in recliner 30-60 mins at end of session, briefs in place as pt had loose stools in AM per RN. Patient will benefit from intensive inpatient follow-up therapy, >3 hours/day pending progress after return to OR tomorrow.    If plan is discharge home, recommend the following: A lot of help with walking and/or transfers;Assistance with cooking/housework;Assist for transportation;Help with stairs or ramp for entrance   Can travel by private vehicle        Equipment  Recommendations  Wheelchair (measurements PT);Wheelchair cushion (measurements PT);BSC/3in1    Recommendations for Other Services       Precautions / Restrictions Precautions Precautions: Fall;Back;Other (comment) Precaution Booklet Issued: Yes (comment) Recall of Precautions/Restrictions: Intact Precaution/Restrictions Comments: watch BP (orthostatic); RLE foot drop Required Braces or Orthoses: Spinal Brace Spinal Brace: Thoracolumbosacral orthotic;Applied in sitting position Restrictions Weight Bearing Restrictions Per Provider Order: Yes Other Position/Activity Restrictions: transfers only on RLE     Mobility  Bed Mobility Overal bed mobility: Needs Assistance Bed Mobility: Rolling, Sidelying to Sit Rolling: Contact guard assist, Used rails Sidelying to sit: Contact guard assist, +2 for safety/equipment, Used rails       General bed mobility comments: Rolling L/R to don briefs prior to transfer to EOB. cues to avoid twisting during log roll; to L EOB    Transfers Overall transfer level: Needs assistance Equipment used: Rolling walker (2 wheels), Ambulation equipment used Transfers: Sit to/from Stand, Bed to chair/wheelchair/BSC Sit to Stand: Via lift equipment, Mod assist, +2 physical assistance, From elevated surface           General transfer comment: Attempting STS from EOB>RW from elevated surface, but unable to achieve upright wtih +2 mod/maxA and bil knees flexed, so returned to sitting. Stedy brought and with Stedy pt able to stand from elevated bed>Stedy and stedy flaps>chair with +2 min/modA Transfer via Lift Equipment: Stedy  Ambulation/Gait               General Gait Details: unable at this time. MD reports transfers only   Stairs  Wheelchair Mobility     Tilt Bed    Modified Rankin (Stroke Patients Only)       Balance Overall balance assessment: Needs assistance Sitting-balance support: Feet supported, No upper  extremity supported Sitting balance-Leahy Scale: Fair Sitting balance - Comments: L rail   Standing balance support: Bilateral upper extremity supported, Reliant on assistive device for balance Standing balance-Leahy Scale: Poor Standing balance comment: reliant on Stedy and external support.                            Communication Communication Communication: No apparent difficulties  Cognition Arousal: Lethargic Behavior During Therapy: Flat affect   PT - Cognitive impairments: Memory, Attention, Initiation, Sequencing, Problem solving, Safety/Judgement                       PT - Cognition Comments: Slower processing this date, needs reminders for no twisting today with bed mobility, increased time to perform tasks. Following commands: Intact Following commands impaired: Only follows one step commands consistently    Cueing Cueing Techniques: Verbal cues, Tactile cues, Gestural cues  Exercises Other Exercises Other Exercises: defer due to pt tachycardia sitting/standing    General Comments General comments (skin integrity, edema, etc.): c/o dizziness but BP stable sitting EOB, SpO2 WFL on RA, HR 120's-130's bpm with transfers, briefly reading to ~144 bpm a few seconds toward end of session transferring to chair. RN present and aware, decreased to ~100-115 bpm in chair when resting. Air bed removed from room and replaced while pt sitting up in chair (previous bed was not correctly working per staff)      Pertinent Vitals/Pain Pain Assessment Pain Assessment: 0-10 Faces Pain Scale: Hurts even more Pain Location: RLE with standing trial at RW and Stedy Pain Descriptors / Indicators: Discomfort, Grimacing, Moaning, Guarding Pain Intervention(s): Limited activity within patient's tolerance, Monitored during session, Premedicated before session, Repositioned    Home Living                          Prior Function            PT Goals (current  goals can now be found in the care plan section) Acute Rehab PT Goals Patient Stated Goal: to return to independence and reduce pain PT Goal Formulation: With patient Time For Goal Achievement: 10/09/24 Progress towards PT goals: Progressing toward goals (slowly due to multiple trips to OR)    Frequency    Min 3X/week      PT Plan      Co-evaluation              AM-PAC PT 6 Clicks Mobility   Outcome Measure  Help needed turning from your back to your side while in a flat bed without using bedrails?: A Little Help needed moving from lying on your back to sitting on the side of a flat bed without using bedrails?: A Little Help needed moving to and from a bed to a chair (including a wheelchair)?: Total Help needed standing up from a chair using your arms (e.g., wheelchair or bedside chair)?: Total Help needed to walk in hospital room?: Total Help needed climbing 3-5 steps with a railing? : Total 6 Click Score: 10    End of Session Equipment Utilized During Treatment: Gait belt;Back brace Activity Tolerance: Treatment limited secondary to medical complications (Comment);Patient limited by lethargy;Other (comment) (tachycardia with sitting/standing) Patient left:  in chair;with call bell/phone within reach;with nursing/sitter in room;with family/visitor present;Other (comment) (sitting in recliner with cushions for comfort, RN in room) Nurse Communication: Mobility status;Need for lift equipment;Precautions;Other (comment) (recommend +2 and stedy to return to EOB from chair) PT Visit Diagnosis: Other abnormalities of gait and mobility (R26.89);Muscle weakness (generalized) (M62.81);Other symptoms and signs involving the nervous system (R29.898)     Time: 8490-8459 PT Time Calculation (min) (ACUTE ONLY): 31 min  Charges:    $Therapeutic Activity: 23-37 mins PT General Charges $$ ACUTE PT VISIT: 1 Visit                     Glorene Leitzke P., PTA Acute Rehabilitation  Services Secure Chat Preferred 9a-5:30pm Office: [phone]    Connell HERO [employer] 10/05/2024, 6:20 PM

## 2024-10-05 NOTE — Plan of Care (Signed)

## 2024-10-05 NOTE — Progress Notes (Signed)
 PHARMACY - ANTICOAGULATION CONSULT NOTE  Pharmacy Consult for Heparin  Indication:  arterial insufficiency / occlusion  Allergies  Allergen Reactions   Ancef  [Cefazolin ] Anaphylaxis    Patient Measurements: Height: 5' 9 (175.3 cm) Weight: 83.2 kg (183 lb 6.8 oz) IBW/kg (Calculated) : 70.7 HEPARIN  DW (KG): 83.2 Heparin  dosing weight = 83 kg  Vital Signs: Temp: 97.7 F (36.5 C) (10/07 1614) Temp Source: Oral (10/07 1614) BP: 128/82 (10/07 1738) Pulse Rate: 114 (10/07 1614)  Labs: Recent Labs    10/03/24 0300 10/04/24 0344 10/05/24 0354 10/05/24 1636  HGB 9.5* 9.7* 10.4*  --   HCT 29.1* 29.8* 32.1*  --   PLT 177 208 241  --   HEPARINUNFRC 0.61 0.62 0.56 0.50  CREATININE 1.15  --   --   --     Estimated Creatinine Clearance: 59.8 mL/min (by C-G formula based on SCr of 1.15 mg/dL).  Assessment: 63 YOM admitted w/ severe vascular insufficiency s/p right axillary bifemoral bypass w/ right common femoral to popliteal bypass. No PTA AC noted through a search of his EMR. Pharmacy consulted to start heparin  therapy.   Patient is s/p R calf lateral fasciotomy washout, debridement of skin and soft tissue on 9/29 and I&D on 10/1. Repeat I&D tentatively planned for 10/8.  10/7 PM: Heparin  level at high end of goal, but in range following rate decrease to 1,750 units/hour. No signs of bleeding or issues with the heparin  infusion noted.  Will empirically decrease in order to stay in range.   Goal of Therapy:  Heparin  level 0.3-0.5 units/ml Monitor platelets by anticoagulation protocol: Yes   Plan:  Decrease heparin  infusion to 1,700 units/hr AM HL Continue to monitor H&H and platelets  Thank you for allowing pharmacy to be a part of this patient's care.   Massie Fila, PharmD Clinical Pharmacist  10/05/2024 5:50 PM

## 2024-10-06 ENCOUNTER — Inpatient Hospital Stay (HOSPITAL_COMMUNITY): Admitting: Certified Registered Nurse Anesthetist

## 2024-10-06 ENCOUNTER — Inpatient Hospital Stay (HOSPITAL_COMMUNITY)

## 2024-10-06 ENCOUNTER — Encounter (HOSPITAL_COMMUNITY): Payer: Self-pay | Admitting: Internal Medicine

## 2024-10-06 ENCOUNTER — Encounter (HOSPITAL_COMMUNITY)
Admission: RE | Disposition: A | Discharge status: Rehabilitation Facility | Payer: Self-pay | Source: Other Acute Inpatient Hospital | Attending: Internal Medicine

## 2024-10-06 DIAGNOSIS — M79A21 Nontraumatic compartment syndrome of right lower extremity: Secondary | ICD-10-CM | POA: Diagnosis not present

## 2024-10-06 DIAGNOSIS — I10 Essential (primary) hypertension: Secondary | ICD-10-CM

## 2024-10-06 DIAGNOSIS — Z87891 Personal history of nicotine dependence: Secondary | ICD-10-CM

## 2024-10-06 DIAGNOSIS — I2510 Atherosclerotic heart disease of native coronary artery without angina pectoris: Secondary | ICD-10-CM

## 2024-10-06 DIAGNOSIS — T79A21A Traumatic compartment syndrome of right lower extremity, initial encounter: Secondary | ICD-10-CM | POA: Diagnosis not present

## 2024-10-06 DIAGNOSIS — I739 Peripheral vascular disease, unspecified: Secondary | ICD-10-CM | POA: Diagnosis not present

## 2024-10-06 HISTORY — PX: INCISION AND DRAINAGE OF DEEP ABSCESS, CALF: SHX7361

## 2024-10-06 LAB — CBC
HCT: 28.4 % — ABNORMAL LOW (ref 39.0–52.0)
Hemoglobin: 9.3 g/dL — ABNORMAL LOW (ref 13.0–17.0)
MCH: 30.8 pg (ref 26.0–34.0)
MCHC: 32.7 g/dL (ref 30.0–36.0)
MCV: 94 fL (ref 80.0–100.0)
Platelets: 258 K/uL (ref 150–400)
RBC: 3.02 MIL/uL — ABNORMAL LOW (ref 4.22–5.81)
RDW: 16.5 % — ABNORMAL HIGH (ref 11.5–15.5)
WBC: 17.7 K/uL — ABNORMAL HIGH (ref 4.0–10.5)
nRBC: 0 % (ref 0.0–0.2)

## 2024-10-06 LAB — HEPARIN LEVEL (UNFRACTIONATED)
Heparin Unfractionated: 0.45 [IU]/mL (ref 0.30–0.70)
Heparin Unfractionated: 0.32 [IU]/mL (ref 0.30–0.70)

## 2024-10-06 SURGERY — INCISION AND DRAINAGE OF DEEP ABSCESS, CALF
Anesthesia: General | Site: Leg Lower | Laterality: Right

## 2024-10-06 MED ORDER — CHLORHEXIDINE GLUCONATE 0.12 % MT SOLN
OROMUCOSAL | Status: AC
  Administered 2024-10-06: 15 mL via OROMUCOSAL
  Filled 2024-10-06: qty 15

## 2024-10-06 MED ORDER — DEXAMETHASONE SODIUM PHOSPHATE 10 MG/ML IJ SOLN
INTRAMUSCULAR | Status: DC | PRN
  Administered 2024-10-06: 5 mg via INTRAVENOUS

## 2024-10-06 MED ORDER — PHENYLEPHRINE 80 MCG/ML (10ML) SYRINGE FOR IV PUSH (FOR BLOOD PRESSURE SUPPORT)
PREFILLED_SYRINGE | INTRAVENOUS | Status: AC
  Filled 2024-10-06: qty 10

## 2024-10-06 MED ORDER — PROPOFOL 10 MG/ML IV BOLUS
INTRAVENOUS | Status: DC | PRN
  Administered 2024-10-06: 80 mg via INTRAVENOUS

## 2024-10-06 MED ORDER — FENTANYL CITRATE (PF) 100 MCG/2ML IJ SOLN
25.0000 ug | INTRAMUSCULAR | Status: DC | PRN
  Administered 2024-10-06 (×2): 50 ug via INTRAVENOUS

## 2024-10-06 MED ORDER — OXYCODONE HCL 5 MG PO TABS: 5.0000 mg | ORAL_TABLET | Freq: Once | ORAL | Status: DC | PRN

## 2024-10-06 MED ORDER — PHENYLEPHRINE 80 MCG/ML (10ML) SYRINGE FOR IV PUSH (FOR BLOOD PRESSURE SUPPORT)
PREFILLED_SYRINGE | INTRAVENOUS | Status: DC | PRN
  Administered 2024-10-06 (×2): 80 ug via INTRAVENOUS
  Administered 2024-10-06: 160 ug via INTRAVENOUS
  Administered 2024-10-06 (×3): 80 ug via INTRAVENOUS
  Administered 2024-10-06: 160 ug via INTRAVENOUS
  Administered 2024-10-06: 80 ug via INTRAVENOUS

## 2024-10-06 MED ORDER — CHLORHEXIDINE GLUCONATE 0.12 % MT SOLN: 15.0000 mL | Freq: Once | OROMUCOSAL | Status: AC

## 2024-10-06 MED ORDER — OXYCODONE HCL 5 MG/5ML PO SOLN: 5.0000 mg | Freq: Once | ORAL | Status: DC | PRN

## 2024-10-06 MED ORDER — VANCOMYCIN HCL 1500 MG/300ML IV SOLN
1500.0000 mg | INTRAVENOUS | Status: DC
  Administered 2024-10-07: 1500 mg via INTRAVENOUS
  Filled 2024-10-06 (×2): qty 300

## 2024-10-06 MED ORDER — MIDAZOLAM HCL 2 MG/2ML IJ SOLN
INTRAMUSCULAR | Status: DC | PRN
  Administered 2024-10-06: 1 mg via INTRAVENOUS

## 2024-10-06 MED ORDER — ONDANSETRON HCL 4 MG/2ML IJ SOLN
INTRAMUSCULAR | Status: AC
  Filled 2024-10-06: qty 2

## 2024-10-06 MED ORDER — ONDANSETRON HCL 4 MG/2ML IJ SOLN: 4.0000 mg | Freq: Four times a day (QID) | INTRAMUSCULAR | Status: DC | PRN

## 2024-10-06 MED ORDER — LACTATED RINGERS IV SOLN: INTRAVENOUS | Status: DC

## 2024-10-06 MED ORDER — MIDAZOLAM HCL 2 MG/2ML IJ SOLN
INTRAMUSCULAR | Status: AC
  Filled 2024-10-06: qty 2

## 2024-10-06 MED ORDER — PROPOFOL 10 MG/ML IV BOLUS
INTRAVENOUS | Status: AC
  Filled 2024-10-06: qty 40

## 2024-10-06 MED ORDER — FENTANYL CITRATE (PF) 250 MCG/5ML IJ SOLN
INTRAMUSCULAR | Status: DC | PRN
  Administered 2024-10-06 (×2): 25 ug via INTRAVENOUS

## 2024-10-06 MED ORDER — PHENYLEPHRINE HCL-NACL 20-0.9 MG/250ML-% IV SOLN
INTRAVENOUS | Status: DC | PRN
  Administered 2024-10-06: 40 ug/min via INTRAVENOUS

## 2024-10-06 MED ORDER — FENTANYL CITRATE (PF) 100 MCG/2ML IJ SOLN
INTRAMUSCULAR | Status: AC
  Filled 2024-10-06: qty 2

## 2024-10-06 MED ORDER — VASHE WOUND IRRIGATION OPTIME
TOPICAL | Status: DC | PRN
  Administered 2024-10-06: 34 [oz_av]

## 2024-10-06 MED ORDER — LIDOCAINE 2% (20 MG/ML) 5 ML SYRINGE
INTRAMUSCULAR | Status: AC
  Filled 2024-10-06: qty 5

## 2024-10-06 MED ORDER — ORAL CARE MOUTH RINSE: 15.0000 mL | Freq: Once | OROMUCOSAL | Status: AC

## 2024-10-06 MED ORDER — VANCOMYCIN HCL 750 MG/150ML IV SOLN
750.0000 mg | Freq: Once | INTRAVENOUS | Status: AC
  Administered 2024-10-06: 750 mg via INTRAVENOUS
  Filled 2024-10-06: qty 150

## 2024-10-06 MED ORDER — LIDOCAINE 2% (20 MG/ML) 5 ML SYRINGE
INTRAMUSCULAR | Status: DC | PRN
  Administered 2024-10-06: 50 mg via INTRAVENOUS

## 2024-10-06 MED ORDER — FENTANYL CITRATE (PF) 250 MCG/5ML IJ SOLN
INTRAMUSCULAR | Status: AC
  Filled 2024-10-06: qty 5

## 2024-10-06 MED ORDER — HEPARIN (PORCINE) 25000 UT/250ML-% IV SOLN
1500.0000 [IU]/h | INTRAVENOUS | Status: DC
  Administered 2024-10-06: 1750 [IU]/h via INTRAVENOUS
  Filled 2024-10-06 (×2): qty 250

## 2024-10-06 MED ORDER — DEXAMETHASONE SODIUM PHOSPHATE 10 MG/ML IJ SOLN
INTRAMUSCULAR | Status: AC
  Filled 2024-10-06: qty 1

## 2024-10-06 SURGICAL SUPPLY — 41 items
BAG COUNTER SPONGE SURGICOUNT (BAG) IMPLANT
BLADE SURG 21 STRL SS (BLADE) ×2 IMPLANT
BNDG COHESIVE 4X5 TAN STRL LF (GAUZE/BANDAGES/DRESSINGS) IMPLANT
BNDG COHESIVE 6X5 TAN NS LF (GAUZE/BANDAGES/DRESSINGS) IMPLANT
BNDG COHESIVE 6X5 TAN ST LF (GAUZE/BANDAGES/DRESSINGS) IMPLANT
BNDG GAUZE DERMACEA FLUFF 4 (GAUZE/BANDAGES/DRESSINGS) IMPLANT
CANISTER WOUNDNEG PRESSURE 500 (CANNISTER) IMPLANT
CLEANSER WND VASHE 34 (WOUND CARE) IMPLANT
CLEANSER WND VASHE INSTL 34OZ (WOUND CARE) IMPLANT
CONNECTOR Y WND VAC (MISCELLANEOUS) IMPLANT
COVER SURGICAL LIGHT HANDLE (MISCELLANEOUS) ×4 IMPLANT
DRAPE DERMATAC (DRAPES) IMPLANT
DRAPE U-SHAPE 47X51 STRL (DRAPES) ×2 IMPLANT
DRESSING PREVENA PLUS CUSTOM (GAUZE/BANDAGES/DRESSINGS) IMPLANT
DRESSING VERAFLO CLEANS CC MED (GAUZE/BANDAGES/DRESSINGS) IMPLANT
DRSG ADAPTIC 3X8 NADH LF (GAUZE/BANDAGES/DRESSINGS) ×2 IMPLANT
DRSG VAC PEEL AND PLACE LRG (GAUZE/BANDAGES/DRESSINGS) IMPLANT
DURAPREP 26ML APPLICATOR (WOUND CARE) ×2 IMPLANT
ELECTRODE REM PT RTRN 9FT ADLT (ELECTROSURGICAL) IMPLANT
GAUZE PAD ABD 8X10 STRL (GAUZE/BANDAGES/DRESSINGS) IMPLANT
GAUZE SPONGE 4X4 12PLY STRL (GAUZE/BANDAGES/DRESSINGS) IMPLANT
GLOVE BIOGEL PI IND STRL 9 (GLOVE) ×2 IMPLANT
GLOVE SURG ORTHO 9.0 STRL STRW (GLOVE) ×2 IMPLANT
GOWN STRL REUS W/ TWL XL LVL3 (GOWN DISPOSABLE) ×4 IMPLANT
GRAFT SKIN WND SURGICLOSE M95 (Tissue) IMPLANT
KIT BASIN OR (CUSTOM PROCEDURE TRAY) ×2 IMPLANT
KIT TURNOVER KIT B (KITS) ×2 IMPLANT
MANIFOLD NEPTUNE II (INSTRUMENTS) ×2 IMPLANT
PACK ORTHO EXTREMITY (CUSTOM PROCEDURE TRAY) ×2 IMPLANT
PAD ARMBOARD POSITIONER FOAM (MISCELLANEOUS) ×4 IMPLANT
PAD NEG PRESSURE SENSATRAC (MISCELLANEOUS) IMPLANT
SET HNDPC FAN SPRY TIP SCT (DISPOSABLE) IMPLANT
SOLN 0.9% NACL 1000 ML (IV SOLUTION) ×1 IMPLANT
SOLN 0.9% NACL POUR BTL 1000ML (IV SOLUTION) ×2 IMPLANT
STOCKINETTE IMPERVIOUS 9X36 MD (GAUZE/BANDAGES/DRESSINGS) IMPLANT
SUT ETHILON 2 0 PSLX (SUTURE) ×2 IMPLANT
SWAB COLLECTION DEVICE MRSA (MISCELLANEOUS) ×2 IMPLANT
SWAB CULTURE ESWAB REG 1ML (MISCELLANEOUS) IMPLANT
TOWEL GREEN STERILE (TOWEL DISPOSABLE) ×2 IMPLANT
TUBE CONNECTING 12X1/4 (SUCTIONS) ×2 IMPLANT
YANKAUER SUCT BULB TIP NO VENT (SUCTIONS) ×2 IMPLANT

## 2024-10-06 NOTE — Progress Notes (Signed)
 PHARMACY - ANTICOAGULATION CONSULT NOTE  Pharmacy Consult for Heparin  Indication:  arterial insufficiency / occlusion  Allergies  Allergen Reactions   Ancef  [Cefazolin ] Anaphylaxis    Patient Measurements: Height: 5' 9 (175.3 cm) Weight: 83.2 kg (183 lb 6.8 oz) IBW/kg (Calculated) : 70.7 HEPARIN  DW (KG): 83.2 Heparin  dosing weight = 83 kg  Vital Signs: Temp: 97.8 F (36.6 C) (10/08 2001) Temp Source: Oral (10/08 2001) BP: 118/101 (10/08 2001) Pulse Rate: 96 (10/08 1209)  Labs: Recent Labs    10/04/24 0344 10/05/24 0354 10/05/24 1636 10/06/24 0344 10/06/24 2048  HGB 9.7* 10.4*  --  9.3*  --   HCT 29.8* 32.1*  --  28.4*  --   PLT 208 241  --  258  --   HEPARINUNFRC 0.62 0.56 0.50 0.45 0.32    Estimated Creatinine Clearance: 59.8 mL/min (by C-G formula based on SCr of 1.15 mg/dL).  Assessment: 32 YOM admitted w/ severe vascular insufficiency s/p right axillary bifemoral bypass w/ right common femoral to popliteal bypass. No PTA AC noted through a search of his EMR. Pharmacy consulted to start heparin  therapy.   Patient is s/p R calf lateral fasciotomy washout, debridement of skin and soft tissue on 9/29 and I&D on 10/1. Repeat I&D tentatively planned for 10/8.  Heparin  level within goal range. No overt bleeding or complications noted. Heparin  held this morning for OR procedure, per MD ok to resume.  Goal of Therapy:  Heparin  level 0.3-0.5 units/ml Monitor platelets by anticoagulation protocol: Yes   Plan:  Resume heparin  infusion at 1750 units/hr Check heparin  level in 8 hours and daily while on heparin  Continue to monitor H&H and platelets F/u OR plans for further debridement  Thank you for allowing pharmacy to be a part of this patient's care.  Shelba Collier, PharmD, BCPS Clinical Pharmacist  _________________________________________  10/8 PM update - s/p I&D of R calf, heparin  restarted ~1400.  HL 0.32 therapeutic on 1750 units/hr.  No changes. F/u  AM labs.  Maurilio Fila, PharmD Clinical Pharmacist 10/06/2024  10:28 PM

## 2024-10-06 NOTE — Progress Notes (Signed)
 Upon arrival to bedside during shift report, wound vac is alarming.  Tubing is disconnected at Y-site and one of two track pads are not attached to dressing.  PA paged and instructed to place compression wrap and turn wound vac pump off as patient is going to OR today for debridement.  Patient is A&O x4 and with no complaints of pain or discomfort currently.   Patient's O2 sensor was not attached.  When new sensor was placed, patient's O2 sat was 85 on RA with good pleth.  [place] 2L was placed on patient with sats rising to low 90's.  Patient stating that he has been seeing and hearing things and people that others in the room are not.  Patient is observed to be speaking to someone that is not present in the room.  Patient answers all orientation questions correctly for this RN.  Will make MD aware.

## 2024-10-06 NOTE — Op Note (Signed)
 10/06/2024  11:11 AM  PATIENT:  David King    PRE-OPERATIVE DIAGNOSIS:  Compartment Syndrome Right Calf  POST-OPERATIVE DIAGNOSIS:  Same  PROCEDURE: Excisional debridement right calf with excision of skin soft tissue muscle and fascia. Local tissue transfer for wound closure 35 x 10 cm. Application Kerecis micro graft 95 cm x 2 and 38 cm x 1. Application of cleanse choice wound VAC sponges x 1 and peel in place wound VAC sponges x 2 Doppler to verify arterial competence Fascia sent for cultures  SURGEON:  Jerona LULLA Sage, MD  PHYSICIAN ASSISTANT:None ANESTHESIA:   General  PREOPERATIVE INDICATIONS:  David King is a  68 y.o. male with a diagnosis of Compartment Syndrome Right Calf who failed conservative measures and elected for surgical management.    The risks benefits and alternatives were discussed with the patient preoperatively including but not limited to the risks of infection, bleeding, nerve injury, cardiopulmonary complications, the need for revision surgery, among others, and the patient was willing to proceed.  OPERATIVE IMPLANTS:   Implant Name Type Inv. Item Serial No. Manufacturer Lot No. LRB No. Used Action  GRAFT SKIN WND SURGICLOSE M95 - [id number] Tissue GRAFT SKIN WND [place]  [employer] [id number] Right 1 Implanted  GRAFT SKIN WND SURGICLOSE M95 - [id number] Tissue GRAFT SKIN WND SURGICLOSE M95  [employer] [id number] Right 1 Implanted    @ENCIMAGES @  OPERATIVE FINDINGS: Doppler showed a dopplerable pulse through the anterior tibial artery.  Tissue margins were clear.  Fascia was sent for cultures.  Muscle had good contractility.  OPERATIVE PROCEDURE: Patient was brought the operating room and underwent a general anesthetic.  After adequate levels anesthesia obtained patient's right lower extremity was prepped using DuraPrep draped into a sterile field a timeout was called.  Patient had early granulation tissue from his most recent  debridement with good healthy tissue.  There was progressive ischemic skin changes anteriorly over the skin flap over the tibia.  Approximately 1 cm of skin was resected over the anterior margin.  The tissue margins were undermined to allow for local tissue transfer.  The medial and lateral skin flaps were mobilized.  There was further fascia that was excised as well as nonviable muscle.  After debridement the muscle had good contractility.  The wound was irrigated with Vashe.  The lateral compartment was mobilized over the anterior compartment to protect the anterior tibial vessel.  The wound bed was filled with Kerecis 95 cm x 2 after mobilization of the lateral compartment the 1 remaining defect was then filled with 38 cm of Kerecis micro graft.  After tissue margins undermined the wound was closed 35 x 10 cm this left 1 small wound that was 4 x 5 cm.  This was covered with a cleanse choice wound VAC sponge and secured with 2 peel in place wound VAC sponges.  The sponges were teed to the [facility] canister this had a good suction fit patient was extubated taken the PACU in stable condition   DISCHARGE PLANNING:  Antibiotic duration: Would continue antibiotics if tissue cultures are positive  Weightbearing: Patient may be full weightbearing on the right  Pain medication: Continue opioid pathway  Dressing care/ Wound VAC: Continue wound VAC for at least 1 week  Ambulatory devices: Walker  Discharge to: Anticipate patient could discharge at this time to skilled nursing will discharge with the Prevena plus portable wound VAC pump.  Follow-up: In the office 1 week post operative.

## 2024-10-06 NOTE — Progress Notes (Signed)
 PROGRESS NOTE    David King  FMW:[id number] DOB: 11-Jul-1956 DOA: 09/24/2024 PCP: [name], MD   Brief Narrative:    68 y.o. male with PMH significant for HTN, HLD, retinal detachment, prostate cancer, PAD s/p b/l common [place] artery stenting, chronic back pain, lumbar radiculopathy.Because of progressive radiculopathy pain, patient was following up with orthopedics as an outpatient.  He had previous L4-L5 disc decompression.  Recent MRI showed severe stenosis of the left L4-L5 due to large disc herniation.   -- 09/08/2024, patient underwent revision L4-L5 decompression, lumbar body fusion by Dr. Beuford.  During the procedure, patient had an anaphylactic reaction to an unknown agent and developed hives with some hypotension. He required short-term intubation and transferred to ICU, post recovery he was noted to have some improvement in his left leg pain, postop site looked okay but he developed some right lower extremity weakness.  Neurology was consulted.  MRI brain was unremarkable.  EMG showed nonspecific changes.  -- 9/17, discharged to CIR. 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 -- 9/26, patient was readmitted to acute care with a plan of revascularization.  Patient underwent right axillary femoral bypass graft, bilateral common femoral, superficial femoral and profundofemoral thrombectomy, right leg anterior compartment fasciotomy due to compartment syndrome, right femoral endarterectomy. Soon after coming out of the OR, he was found to have decrease in his right lower extremity pulses again. Seen by vascular surgery, taken to the OR again.  Underwent right leg angiogram, found to have thrombus in the popliteal artery, underwent successful thrombectomy. Postop, admitted  to [facility] -- 9/29, underwent right calf lateral fasciotomy washout, skin debridement and wound VAC placement.  -09/28/2024 right calf lateral fasciotomy without any skin debridement and wound VAC placement - 09/29/2024 excisional debridement skin and soft tissue muscle and fascia anterior compartment of the right leg transfer of the lateral compartment muscles to the anterior compartment to cover the anterior tibial vessels.  Dr. Harden 10/02/2019 5 repeat debridement with vascular I&D and excisional debridement Plan for serial debridement again on October 8 with wound vac change.  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  But right foot with no motor or sensory function.   09/29/2024 Dr. Harden S/P  Excisional debridement skin soft tissue muscle and fascia anterior compartment right leg.  Transfer of the lateral compartment muscles to the anterior compartment to cover the anterior tibial vessels.  Repeat debridement 10/01/2024 with vascular status post I&D and excisional debridement Dr. Harden to do serial debridement and change of wound vac today. He still does not have sensation in his right  foot despite revascularization.   Lumbar radiculopathy S/p recent L4-L5 decompression/fusion -9/10 Dr Beuford Continue morphine  and OxyContin  and oxycodone  Lyrica    Acute blood loss anemia-hemoglobin dropped as low as 6.8 status post 1 unit of packed RBC now stable at 9.5   AKI Resolved  with IV hydration.     Acute urinary retention Foul-smelling urine H/o prostate cancer he had Foley catheter inserted on admission.  This was removed on 27 September.  Since then he required multiple In-N-Out catheterization.  With routine reminders he is able to urinate without having a Foley in place.  Continue Flomax .   Hyponatremia Mild.  Continue to monitor.   Hyperlipidemia Crestor  20 mg daily   COPD Bronchodilators as needed   Diverticulosis with constipation he has not had a bowel movement in multiple days he has been on Colace senna Dulcolax and Dulcolax suppository and lactulose KUB done on 10/04/2024 showed no bowel obstruction.   Anxiety/Major moderate depression Celexa  20 mg daily, Cymbalta  nightly, Ambien  nightly as needed   Pressure injury bilateral buttocks and right heel deep tissue pressure injury seen by wound care.   Severe malnutrition due to acute illness as evidenced by moderate muscle depletion mild fat depletion.  Dietary consulted continue Ensure mighty shake Juven twice daily multivitamin.  DVT prophylaxis:      Code Status: Full Code Family Communication:  None at the bedside Status is: Inpatient Remains inpatient appropriate because: LE debridements    Subjective:  Denied any active compaints. His oxygen  was slightly low at night and he is slightly short of breath. I told him that I have ordered CXR.   Examination:  General exam: Appears calm and comfortable  Respiratory system: Clear to auscultation. Respiratory effort normal. Cardiovascular system: S1 & S2 heard, RRR. No JVD, murmurs, rubs, gallops or clicks. No pedal edema. Gastrointestinal system:  Abdomen is nondistended, soft and nontender. No organomegaly or masses felt. Normal bowel sounds heard. Central nervous system: Alert and oriented.  Extremities: RLE dressing and wound vac x 2 in place   Wound 10/05/24 0815 Pressure Injury Buttocks Right;Left Stage 1 -  Intact skin with non-blanchable redness of a localized area usually over a bony prominence. (Active)     Diet Orders (From admission, onward)     Start     Ordered   10/06/24 0001  Diet NPO time specified  Diet effective midnight        10/05/24 1455            Objective: Vitals:   10/06/24 0000 10/06/24 0400 10/06/24 0735 10/06/24 0935  BP: 110/63 97/74 92/71  99/67  Pulse:   (!) 102 97  Resp:  16 18 18   Temp:  98.1 F (36.7 C) 98.1 F (36.7 C) 97.6 F (36.4 C)  TempSrc:  Oral Oral Oral  SpO2: (!) 82% 93% 90% 90%  Weight:      Height:        Intake/Output Summary (Last 24 hours) at 10/06/2024 1015 Last data filed at 10/06/2024 9371 Gross per 24 hour  Intake 200 ml  Output 1400 ml  Net -1200 ml   Filed Weights   09/29/24 1049 10/01/24 1038  Weight: 83.2 kg 83.2 kg    Scheduled Meds:  [MAR Hold] aspirin  EC  81 mg Oral Daily   [MAR Hold] citalopram   20 mg Oral Daily   [MAR Hold] docusate sodium   100 mg Oral BID   [MAR Hold] DULoxetine   30 mg Oral QHS   [MAR Hold] feeding  supplement  237 mL Oral BID BM   [MAR Hold] multivitamin with minerals  1 tablet Oral Daily   [MAR Hold] nutrition supplement (JUVEN)  1 packet Oral BID BM   [MAR Hold] oxyCODONE   10 mg Oral Q12H   [MAR Hold] polyethylene glycol  17 g Oral BID   [MAR Hold] pregabalin   150 mg Oral TID   [MAR Hold] rosuvastatin   20 mg Oral Daily   [MAR Hold] senna-docusate  3 tablet Oral QHS   [MAR Hold] SMOG  300 mL Rectal Once   [MAR Hold] tamsulosin   0.4 mg Oral QPC supper   [MAR Hold] thiamine  100 mg Oral Daily   Continuous Infusions:  heparin  1,750 Units/hr (10/06/24 0801)   lactated ringers  10 mL/hr at 10/06/24 0937   levofloxacin  (LEVAQUIN) IV     vancomycin  1,000 mg (10/06/24 9062)    Nutritional status Signs/Symptoms: moderate muscle depletion, mild fat depletion, energy intake < or equal to 50% for > or equal to 5 days Interventions: Refer to RD note for recommendations Body mass index is 27.09 kg/m.  Data Reviewed:   CBC: Recent Labs  Lab 10/02/24 0353 10/03/24 0300 10/04/24 0344 10/05/24 0354 10/06/24 0344  WBC 9.3 12.3* 13.9* 18.6* 17.7*  HGB 9.7* 9.5* 9.7* 10.4* 9.3*  HCT 30.0* 29.1* 29.8* 32.1* 28.4*  MCV 95.8 96.4 95.2 95.5 94.0  PLT 165 177 208 241 258   Basic Metabolic Panel: Recent Labs  Lab 09/30/24 0505 09/30/24 1531 10/01/24 0510 10/02/24 0353 10/03/24 0300  NA 132* 135 137 134* 134*  K 4.0 3.7 3.6 4.2 4.1  CL 99 101 102 102 99  CO2 24 22 25 23 24   GLUCOSE 105* 116* 93 113* 99  BUN 13 16 13 16  27*  CREATININE 1.09 1.21 1.07 1.06 1.15  CALCIUM  8.3* 8.2* 8.5* 8.5* 8.7*   GFR: Estimated Creatinine Clearance: 59.8 mL/min (by C-G formula based on SCr of 1.15 mg/dL). Liver Function Tests: Recent Labs  Lab 09/30/24 1531  AST 45*  ALT 64*  ALKPHOS 90  BILITOT 0.8  PROT 5.7*  ALBUMIN  2.5*   No results for input(s): LIPASE, AMYLASE in the last 168 hours. No results for input(s): AMMONIA in the last 168 hours. Coagulation Profile: No results for input(s): INR, PROTIME in the last 168 hours. Cardiac Enzymes: No results for input(s): CKTOTAL, CKMB, CKMBINDEX, TROPONINI in the last 168 hours. BNP (last 3 results) No results for input(s): PROBNP in the last 8760 hours. HbA1C: No results for input(s): HGBA1C in the last 72 hours. CBG: Recent Labs  Lab 10/01/24 1628  GLUCAP 131*   Lipid Profile: No results for input(s): CHOL, HDL, LDLCALC, TRIG, CHOLHDL, LDLDIRECT in the last 72 hours. Thyroid Function Tests: No results for input(s): TSH, T4TOTAL, FREET4, T3FREE, THYROIDAB in the last 72 hours. Anemia Panel: No results for  input(s): VITAMINB12, FOLATE, FERRITIN, TIBC, IRON, RETICCTPCT in the last 72 hours. Sepsis Labs: No results for input(s): PROCALCITON, LATICACIDVEN in the last 168 hours.  Recent Results (from the past 240 hours)  MRSA Next Gen by PCR, Nasal     Status: None   Collection Time: 10/05/24  7:36 PM   Specimen: Nasal Mucosa; Nasal Swab  Result Value Ref Range Status   MRSA by PCR Next Gen NOT DETECTED NOT DETECTED Final    Comment: (NOTE) The GeneXpert MRSA Assay (FDA approved for NASAL specimens only), is one component of a comprehensive MRSA colonization surveillance program. It is not intended to diagnose MRSA  infection nor to guide or monitor treatment for MRSA infections. Test performance is not FDA approved in patients less than 81 years old. Performed at [facility], [address], [place], [place] [address]          Radiology Studies: DG Abd 1 View Result Date: 10/04/2024 CLINICAL DATA:  Abdominal pain. EXAM: ABDOMEN - 1 VIEW COMPARISON:  CT 09/23/2019 FINDINGS: No bowel dilatation or evidence of obstruction. Moderate volume of stool in the colon. Iliac vascular stents in place. Left renal calculi are obscured by overlying bowel gas. Lumbar fusion hardware. IMPRESSION: No bowel obstruction. Moderate colonic stool burden. Electronically Signed   By: Andrea Gasman M.D.   On: 10/04/2024 19:13           LOS: 12 days   Time spent= 39 mins    Deliliah Room, MD [facility]  If 7PM-7AM, please contact night-coverage  10/06/2024, 10:15 AM

## 2024-10-06 NOTE — Progress Notes (Addendum)
 PHARMACY - ANTICOAGULATION CONSULT NOTE  Pharmacy Consult for Heparin  Indication:  arterial insufficiency / occlusion  Allergies  Allergen Reactions   Ancef  [Cefazolin ] Anaphylaxis    Patient Measurements: Height: 5' 9 (175.3 cm) Weight: 83.2 kg (183 lb 6.8 oz) IBW/kg (Calculated) : 70.7 HEPARIN  DW (KG): 83.2 Heparin  dosing weight = 83 kg  Vital Signs: Temp: 98.1 F (36.7 C) (10/08 0735) Temp Source: Oral (10/08 0735) BP: 92/71 (10/08 0735) Pulse Rate: 102 (10/08 0735)  Labs: Recent Labs    10/04/24 0344 10/05/24 0354 10/05/24 1636 10/06/24 0344  HGB 9.7* 10.4*  --  9.3*  HCT 29.8* 32.1*  --  28.4*  PLT 208 241  --  258  HEPARINUNFRC 0.62 0.56 0.50 0.45    Estimated Creatinine Clearance: 59.8 mL/min (by C-G formula based on SCr of 1.15 mg/dL).  Assessment: 89 YOM admitted w/ severe vascular insufficiency s/p right axillary bifemoral bypass w/ right common femoral to popliteal bypass. No PTA AC noted through a search of his EMR. Pharmacy consulted to start heparin  therapy.   Patient is s/p R calf lateral fasciotomy washout, debridement of skin and soft tissue on 9/29 and I&D on 10/1. Repeat I&D tentatively planned for 10/8.  Heparin  level within goal range. No overt bleeding or complications noted. Heparin  held this morning for OR procedure, per MD ok to resume.  Goal of Therapy:  Heparin  level 0.3-0.5 units/ml Monitor platelets by anticoagulation protocol: Yes   Plan:  Resume heparin  infusion at 1750 units/hr Check heparin  level in 8 hours and daily while on heparin  Continue to monitor H&H and platelets F/u OR plans for further debridement  Thank you for allowing pharmacy to be a part of this patient's care.  Shelba Collier, PharmD, BCPS Clinical Pharmacist

## 2024-10-06 NOTE — Evaluation (Signed)
 Clinical/Bedside Swallow Evaluation Patient Details  Name: David King MRN: [id number] Date of Birth: 27-Jan-1956  Today's Date: 10/06/2024 Time: SLP Start Time (ACUTE ONLY): 1537 SLP Stop Time (ACUTE ONLY): 1547 SLP Time Calculation (min) (ACUTE ONLY): 10 min  Past Medical History:  Past Medical History:  Diagnosis Date   Anxiety    new dx   Arthritis    lumbar   Atherosclerotic vascular disease    Cataract    COPD (chronic obstructive pulmonary disease) (HCC)    Diverticulosis    Elevated PSA    Headache(784.0)    MIGRAINES   Hypertension 2021   Iritis    CHRONIC IN LEFT EYE - SOME VISIAL IMPAIRMENT IN LEFT EYE   Neuromuscular disorder (HCC)    left leg/foot,pinched siactic nerve   Pain    PAIN LEFT HIP AND DOWN LT LEG WITH NUMBNESS IN LEFT LEG--PT STATES SCIATIC NERVE IMPINGEMENT - PT PLANS BACK IN THE NEAR SURGERY.   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   Renal cysts, acquired, bilateral 03/19/2013   several simple , CT   Urinary frequency    AND NOCTURIA   Past Surgical History:  Past Surgical History:  Procedure Laterality Date   ABDOMINAL AORTOGRAM W/LOWER EXTREMITY N/A 09/21/2024   Procedure: ABDOMINAL AORTOGRAM W/LOWER EXTREMITY;  Surgeon: Serene Gaile ORN, MD;  Location: [facility];  Service: Cardiovascular;  Laterality: N/A;   APPLICATION OF WOUND VAC Right 09/27/2024   Procedure: APPLICATION, WOUND VAC RIGHT LOWER EXTREMITY;  Surgeon: Lanis Fonda BRAVO, MD;  Location: [facility] OR;  Service: Vascular;  Laterality: Right;   APPLICATION OF WOUND VAC Right 10/01/2024   Procedure: APPLICATION, WOUND VAC;  Surgeon: Lanis Fonda BRAVO, MD;  Location: [facility] OR;  Service: Vascular;  Laterality: Right;   APPLICATION, SKIN SUBSTITUTE Right 09/27/2024   Procedure: APPLICATION, MYRIAD SKIN SUBSTITUTE;  Surgeon: Lanis Fonda BRAVO, MD;  Location: [facility] OR;  Service: Vascular;  Laterality: Right;   APPLICATION, SKIN SUBSTITUTE Right  10/01/2024   Procedure: APPLICATION, SKIN SUBSTITUTE KERECIS 95SQ CM;  Surgeon: Lanis Fonda BRAVO, MD;  Location: [facility] OR;  Service: Vascular;  Laterality: Right;   AXILLARY-FEMORAL BYPASS GRAFT Right 09/24/2024   Procedure: CREATION, BYPASS, ARTERIAL, AXILLARY TO BILATERAL FEMORAL, USING PROPATEN X 80CM X 2 GRAFT;  Surgeon: Serene Gaile ORN, MD;  Location: [facility] OR;  Service: Vascular;  Laterality: Right;   BACK SURGERY     CARDIAC CATHETERIZATION  03/10/2018   CATARACT EXTRACTION W/ INTRAOCULAR LENS IMPLANT Left    ENDARTERECTOMY FEMORAL Bilateral 09/24/2024   Procedure: BILATERAL FEMORAL ENDARTERECTOMY;  Surgeon: Serene Gaile ORN, MD;  Location: [facility] OR;  Service: Vascular;  Laterality: Bilateral;   EYE SURGERY     RETINAL SURGERY LEFT EYE   FASCIOTOMY Right 09/24/2024   Procedure: RIGHT LOWER LEG FOUR COMPARTMENT FASCIOTOMY WITH RESECTION OF TIBIALIS ANTERIOR;  Surgeon: Serene Gaile ORN, MD;  Location: [facility] OR;  Service: Vascular;  Laterality: Right;   FASCIOTOMY CLOSURE Right 09/27/2024   Procedure: RIGHT LOWER EXTREMITY FASCIOTOMY WASHOUT;  Surgeon: Lanis Fonda BRAVO, MD;  Location: [facility] OR;  Service: Vascular;  Laterality: Right;  RIGHT LEG CLOSURE   HYDROCELE EXCISION Left 03/05/2013   Procedure: HYDROCELECTOMY ADULT;  Surgeon: Donnice Gwenyth Brooks, MD;  Location: [facility];  Service: Urology;  Laterality: Left;   INCISION AND DRAINAGE OF DEEP ABSCESS, CALF Right 09/29/2024   Procedure: INCISION AND DRAINAGE OF DEEP ABSCESS, CALF;  Surgeon: Harden Lame  V, MD;  Location: [facility] OR;  Service: Orthopedics;  Laterality: Right;   INCISION AND DRAINAGE OF WOUND Right 10/01/2024   Procedure: IRRIGATION AND DEBRIDEMENT WOUND;  Surgeon: Lanis Fonda BRAVO, MD;  Location: [facility] OR;  Service: Vascular;  Laterality: Right;   INCISIONAL HERNIA REPAIR N/A 06/18/2022   Procedure: REPAIR OF INTERNAL HERNIA;  Surgeon: Rubin Calamity, MD;  Location: [facility] OR;  Service: General;  Laterality: N/A;   INGUINAL  HERNIA REPAIR Bilateral AGE 68   INSERTION OF MESH N/A 06/11/2022   Procedure: INSERTION OF MESH;  Surgeon: Rubin Calamity, MD;  Location: [facility] OR;  Service: General;  Laterality: N/A;   INSERTION OF MESH N/A 06/18/2022   Procedure: INSERTION OF MESH;  Surgeon: Rubin Calamity, MD;  Location: [facility] OR;  Service: General;  Laterality: N/A;   KYPHOPLASTY N/A 05/08/2023   Procedure: THORACIC SEVEN KYPHOPLASTY;  Surgeon: Beuford Anes, MD;  Location: [facility] OR;  Service: Orthopedics;  Laterality: N/A;   LAPAROSCOPY N/A 06/18/2022   Procedure: LAPAROSCOPY DIAGNOSTIC;  Surgeon: Rubin Calamity, MD;  Location: [facility] OR;  Service: General;  Laterality: N/A;   LOWER EXTREMITY ANGIOGRAM Right 09/24/2024   Procedure: RIGHT LOWER EXTREMITY ANGIOGRAM AND RIGHT POPLETEAL THROMBECTOMY;  Surgeon: Serene Gaile ORN, MD;  Location: [facility] OR;  Service: Vascular;  Laterality: Right;   LYMPHADENECTOMY Bilateral 05/06/2013   Procedure: REDGIE;  Surgeon: Noretta Ferrara, MD;  Location: [facility] ORS;  Service: Urology;  Laterality: Bilateral;   PROSTATE BIOPSY N/A 03/05/2013   Procedure: PROSTATE BIOPSY and ultrasound;  Surgeon: Donnice Gwenyth Brooks, MD;  Location: [facility];  Service: Urology;  Laterality: N/A;   REMOVAL BURSA SAC, LEFT ELBOW  1996   ROBOT ASSISTED LAPAROSCOPIC RADICAL PROSTATECTOMY N/A 05/06/2013   Procedure: ROBOTIC ASSISTED LAPAROSCOPIC RADICAL PROSTATECTOMY LEVEL 3;  Surgeon: Noretta Ferrara, MD;  Location: [facility] ORS;  Service: Urology;  Laterality: N/A;   THROMBECTOMY FEMORAL ARTERY Bilateral 09/24/2024   Procedure: BILATERAL FEMORAL ARTERY THROMBECTOMY;  Surgeon: Serene Gaile ORN, MD;  Location: [facility];  Service: Vascular;  Laterality: Bilateral;   TONSILLECTOMY     as achild   TRANSFORAMINAL LUMBAR INTERBODY FUSION (TLIF) WITH PEDICLE SCREW FIXATION 1 LEVEL Left 09/08/2024   Procedure: LEFT-SIDED LUMBAR 4- LUMBAR 5 TRANSFORAMINAL LUMBAR INTERBODY FUSION AND DECOMPRESSION WITH INSTRUMENTATION AND  ALLOGRAFT;  Surgeon: Beuford Anes, MD;  Location: [facility];  Service: Orthopedics;  Laterality: Left;  LEFT-SIDED LUMBAR 4- LUMBAR 5 TRANSFORAMINAL LUMBAR INTERBODY FUSION AND DECOMPRESSION WITH INSTRUMENTATION AND ALLOGRAFT   XI ROBOTIC ASSISTED VENTRAL HERNIA N/A 06/11/2022   Procedure: ROBOTIC INCISIONAL HERNIA REPAIR WITH MESH;  Surgeon: Rubin Calamity, MD;  Location: [facility] OR;  Service: General;  Laterality: N/A;   HPI:  Kace Hartje  is a 68 y.o. male, with history of Lumbar radiculopathy, chronic pain,  Anaphylactic reaction during lumbar fusion with hypotension dyslipidemia, COPD, prostate cancer, detached retina of the left eye, PAD with stenting of bilateral common iliac arteries who was initially admitted to the hospital 2 weeks ago for L4-L5 disc disease with left lower extremity pain and weakness, he underwent L4-L5 t decompression and fusion on 09/08/2024 by Dr. Beuford.  He required short-term intubation and transferred to ICU, post recovery he was noted to have some improvement in his left leg pain, postop site looked okay but he developed some right lower extremity weakness.  Discharged to inpatient rehab where he was seen by neurology underwent MRI brain which was unremarkable, EMG showed some nonspecific changes.  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 Taken back to the OR on 09/24/2024 for right axillary femoral bypass graft, bilateral common femoral, superficial femoral and profundofemoral thrombectomy, right leg anterior compartment fasciotomy due to compartment syndrome, right femoral endarterectomy. Returned to OR for R calf fasciotomy 9/30, excisional debridement 10/1, repeat I&D 10/3 and 10/8. Evaluated by SLP 9/27 with functional appearing swallow but reconsulted after  he was seen coughing with liquids by RD.    Assessment / Plan / Recommendation  Clinical Impression  Pt and his wife endorse a history of esophageal spasms specifically at the onset of meals but otherwise deny acute concerns. He fed himself regular solids, masticating thoroughly and achieving complete clearance. Consecutive sips of water  were observed without signs clinically concerning for aspiration. Chest imaging has been stable throughout this admission but offered MBS given reconsultation, though pt and his family decline at this time. Recommend he continue regular diet and thin liquids without ongoing SLP f/u. SLP Visit Diagnosis: Dysphagia, unspecified (R13.10)    Aspiration Risk  Mild aspiration risk    Diet Recommendation Regular;Thin liquid    Liquid Administration via: Cup;Straw Medication Administration: Whole meds with liquid Supervision: Patient able to self feed Compensations: Slow rate;Small sips/bites Postural Changes: Seated upright at 90 degrees    Other  Recommendations Oral Care Recommendations: Oral care BID     Assistance Recommended at Discharge    Functional Status Assessment Patient has not had a recent decline in their functional status  Frequency and Duration            Prognosis Prognosis for improved oropharyngeal function: Good Barriers to Reach Goals: Time post onset      Swallow Study   General HPI: David King  is a 68 y.o. male, with history of Lumbar radiculopathy, chronic pain,  Anaphylactic reaction during lumbar fusion with hypotension dyslipidemia, COPD, prostate cancer, detached retina of the left eye, PAD with stenting of bilateral common iliac arteries who was initially admitted to the hospital 2 weeks ago for L4-L5 disc disease with left lower extremity pain and weakness, he underwent L4-L5 t decompression and fusion on 09/08/2024 by Dr. Beuford.  He required short-term intubation and transferred to ICU, post recovery he was noted to  have some improvement in his left leg pain, postop site looked okay but he developed some right lower extremity weakness.  Discharged to inpatient rehab where he was seen by neurology underwent MRI brain which was unremarkable, EMG showed some nonspecific changes.  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 Taken back to the OR on 09/24/2024 for right axillary femoral bypass graft, bilateral common femoral, superficial femoral and profundofemoral thrombectomy, right leg anterior compartment fasciotomy due to compartment syndrome, right femoral endarterectomy. Returned to OR for R calf fasciotomy 9/30, excisional debridement 10/1, repeat I&D 10/3 and 10/8. Evaluated by SLP 9/27 with functional appearing swallow but reconsulted after he was seen coughing with liquids by RD. Type of Study: Bedside Swallow Evaluation Previous Swallow Assessment: see HPI Diet Prior to this Study: Regular;Thin liquids (Level 0) Temperature Spikes Noted: No Respiratory Status: Room air History of Recent Intubation: No Behavior/Cognition: Alert;Cooperative;Pleasant mood Oral Cavity Assessment: Within Functional Limits Oral  Care Completed by SLP: No Oral Cavity - Dentition: Adequate natural dentition Vision: Functional for self-feeding Self-Feeding Abilities: Able to feed self Patient Positioning: Upright in bed Baseline Vocal Quality: Normal Volitional Cough: Congested Volitional Swallow: Able to elicit    Oral/Motor/Sensory Function Overall Oral Motor/Sensory Function: Within functional limits   Ice Chips Ice chips: Not tested   Thin Liquid Thin Liquid: Within functional limits Presentation: Self Fed;Straw    Nectar Thick Nectar Thick Liquid: Not tested   Honey Thick Honey Thick Liquid: Not tested    Puree Puree: Not tested   Solid     Solid: Within functional limits Presentation: Self Fed      Damien Blumenthal, M.A., CCC-SLP Speech Language Pathology, Acute Rehabilitation Services  Secure Chat preferred [phone]  10/06/2024,4:23 PM

## 2024-10-06 NOTE — Anesthesia Procedure Notes (Signed)
 Procedure Name: LMA Insertion Date/Time: 10/06/2024 10:09 AM  Performed by: Jolynn Mage, CRNAPre-anesthesia Checklist: Patient identified, Emergency Drugs available, Suction available and Patient being monitored Patient Re-evaluated:Patient Re-evaluated prior to induction Oxygen  Delivery Method: Circle system utilized Preoxygenation: Pre-oxygenation with 100% oxygen  Induction Type: IV induction Ventilation: Mask ventilation without difficulty LMA: LMA flexible inserted LMA Size: 4.0 Number of attempts: 1 Placement Confirmation: positive ETCO2 and breath sounds checked- equal and bilateral Tube secured with: Tape Dental Injury: Teeth and Oropharynx as per pre-operative assessment

## 2024-10-06 NOTE — Anesthesia Preprocedure Evaluation (Signed)
 Anesthesia Evaluation  Patient identified by MRN, date of birth, ID band Patient awake    Reviewed: Allergy  & Precautions, H&P , NPO status , Patient's Chart, lab work & pertinent test results  History of Anesthesia Complications Negative for: history of anesthetic complications  Airway Mallampati: II       Dental  (+) Teeth Intact, Dental Advisory Given   Pulmonary COPD, former smoker   breath sounds clear to auscultation       Cardiovascular hypertension, + angina  + CAD and + Peripheral Vascular Disease   Rhythm:Regular Rate:Normal     Neuro/Psych    GI/Hepatic ,GERD  ,,  Endo/Other  neg diabetes    Renal/GU Renal disease     Musculoskeletal  (+) Arthritis ,    Abdominal   Peds  Hematology   Anesthesia Other Findings   Reproductive/Obstetrics                              Anesthesia Physical Anesthesia Plan  ASA: 3  Anesthesia Plan: General   Post-op Pain Management:    Induction: Intravenous  PONV Risk Score and Plan: 2  Airway Management Planned: LMA  Additional Equipment:   Intra-op Plan:   Post-operative Plan: Extubation in OR  Informed Consent: I have reviewed the patients History and Physical, chart, labs and discussed the procedure including the risks, benefits and alternatives for the proposed anesthesia with the patient or authorized representative who has indicated his/her understanding and acceptance.     Dental advisory given  Plan Discussed with: CRNA, Surgeon and Anesthesiologist  Anesthesia Plan Comments: (68 y.o. male with PMH significant for HTN, HLD, PAD s/p b/l common Ilac artery stenting, prostate cancer, COPD, diverticulosis, chronic back pain, lumbar radiculopathy - who presents to OR for washout s/p fasciotomy. Hgb 9.1, Plts 150s. Type and Screen active. Plan for GA via LMA. )         Anesthesia Quick Evaluation

## 2024-10-06 NOTE — Progress Notes (Signed)
Subjective  -   Status post right axillary bifemoral bypass graft for right leg ischemia and subsequent muscle debridement  No acute overnight events.  The wound VAC stopped functioning   Physical Exam:  Brisk anterior tibial and posterior tibial Doppler signals Right leg dressings in place   Persistent right foot drop with loss of sensation from the knee down    Assessment/Plan:    Plan for wound VAC change in the operating room with Dr. Harden today  Continue to work with therapy  David King 10/06/2024 8:28 AM --  Vitals:   10/06/24 0400 10/06/24 0735  BP: 97/74 92/71  Pulse:  (!) 102  Resp: 16 18  Temp: 98.1 F (36.7 C) 98.1 F (36.7 C)  SpO2: 93% 90%    Intake/Output Summary (Last 24 hours) at 10/06/2024 0828 Last data filed at 10/06/2024 9371 Gross per 24 hour  Intake 200 ml  Output 1400 ml  Net -1200 ml     Laboratory CBC    Component Value Date/Time   WBC 17.7 (H) 10/06/2024 0344   HGB 9.3 (L) 10/06/2024 0344   HCT 28.4 (L) 10/06/2024 0344   PLT 258 10/06/2024 0344    BMET    Component Value Date/Time   NA 134 (L) 10/03/2024 0300   K 4.1 10/03/2024 0300   CL 99 10/03/2024 0300   CO2 24 10/03/2024 0300   GLUCOSE 99 10/03/2024 0300   BUN 27 (H) 10/03/2024 0300   CREATININE 1.15 10/03/2024 0300   CREATININE 1.06 10/10/2020 0915   CALCIUM  8.7 (L) 10/03/2024 0300   GFRNONAA >60 10/03/2024 0300   GFRNONAA 73 10/10/2020 0915   GFRAA 84 10/10/2020 0915    COAG Lab Results  Component Value Date   INR 1.2 08/22/2022   INR 1.1 06/13/2022   No results found for: PTT  Antibiotics Anti-infectives (From admission, onward)    Start     Dose/Rate Route Frequency Ordered Stop   10/06/24 0600  vancomycin  (VANCOCIN ) IVPB 1000 mg/200 mL premix        1,000 mg 200 mL/hr over 60 Minutes Intravenous On call to O.R. 10/05/24 1930 10/07/24 0559   10/06/24 0600  levofloxacin (LEVAQUIN) IVPB 500 mg        500 mg 100 mL/hr over 60 Minutes  Intravenous On call to O.R. 10/05/24 1930 10/07/24 0559   10/01/24 1315  vancomycin  (VANCOCIN ) IVPB 1000 mg/200 mL premix  Status:  Discontinued        1,000 mg 200 mL/hr over 60 Minutes Intravenous  Once 10/01/24 1300 10/01/24 1504   10/01/24 1259  vancomycin  (VANCOCIN ) 1-5 GM/200ML-% IVPB       Note to Pharmacy: Effie Rily O: cabinet override      10/01/24 1259 10/02/24 0114   09/29/24 0730  levofloxacin (LEVAQUIN) IVPB 500 mg        500 mg 100 mL/hr over 60 Minutes Intravenous On call to O.R. 09/29/24 0644 09/29/24 1350   09/29/24 0730  vancomycin  (VANCOCIN ) IVPB 1000 mg/200 mL premix        1,000 mg 200 mL/hr over 60 Minutes Intravenous On call to O.R. 09/29/24 0644 09/29/24 1700   09/24/24 2115  vancomycin  (VANCOCIN ) IVPB 1000 mg/200 mL premix        1,000 mg 200 mL/hr over 60 Minutes Intravenous Every 12 hours 09/24/24 2027 09/25/24 1030        V. David King CLORE, M.D., [facility] Office: [phone] Pager:  [phone]  

## 2024-10-06 NOTE — Progress Notes (Signed)
 OT Cancellation Note  Patient Details Name: David King MRN: [id number] DOB: July 31, 1956   Cancelled Treatment:    Reason Eval/Treat Not Completed: Patient at procedure or test/ unavailable (off unit for I &D)  Cerrone Debold K, OTD, OTR/L SecureChat Preferred Acute Rehab [phone]   David King 10/06/2024, 9:24 AM

## 2024-10-06 NOTE — Progress Notes (Signed)
 SLP Cancellation Note  Patient Details Name: David King MRN: [id number] DOB: 21-Mar-1956   Cancelled treatment:       Reason Eval/Treat Not Completed: Patient at procedure or test/unavailable (NPO for I&D today). SLP will continue following.    Damien Blumenthal, M.A., CCC-SLP Speech Language Pathology, Acute Rehabilitation Services  Secure Chat preferred [phone]  10/06/2024, 9:18 AM

## 2024-10-06 NOTE — Progress Notes (Signed)
 Pharmacy Antibiotic Note  David King is a 68 y.o. male admitted on 09/24/2024 with wound infection.  Pharmacy has been consulted for vancomycin  dosing.  Plan: Vancomycin  1000 mg IV x1 received in PACU  > will give another 750 mg x1 to complete load > followed by vancomycin  1500 mg IV Q24h (eAUC 464, goal AUC 400-550, Scr 1.15) Trend WBC, fever, renal function F/u cultures, clinical progress, levels as indicated De-escalate when able   Height: 5' 9 (175.3 cm) Weight: 83.2 kg (183 lb 6.8 oz) IBW/kg (Calculated) : 70.7  Temp (24hrs), Avg:98.1 F (36.7 C), Min:97.6 F (36.4 C), Max:98.7 F (37.1 C)  Recent Labs  Lab 09/30/24 0505 09/30/24 1531 10/01/24 0510 10/02/24 0353 10/03/24 0300 10/04/24 0344 10/05/24 0354 10/06/24 0344  WBC 6.0 8.7 8.7 9.3 12.3* 13.9* 18.6* 17.7*  CREATININE 1.09 1.21 1.07 1.06 1.15  --   --   --     Estimated Creatinine Clearance: 59.8 mL/min (by C-G formula based on SCr of 1.15 mg/dL).    Allergies  Allergen Reactions   Ancef  [Cefazolin ] Anaphylaxis   Microbiology results: 10/8 OR Cx: in process  Thank you for allowing pharmacy to be a part of this patient's care.  Shelba Collier, PharmD, BCPS Clinical Pharmacist

## 2024-10-06 NOTE — Transfer of Care (Signed)
 Immediate Anesthesia Transfer of Care Note  Patient: David King  Procedure(s) Performed: INCISION AND DRAINAGE OF DEEP ABSCESS, CALF RIGHT (Right: Leg Lower)  Patient Location: PACU  Anesthesia Type:General  Level of Consciousness: awake, alert , and patient cooperative  Airway & Oxygen  Therapy: Patient Spontanous Breathing and Patient connected to face mask oxygen   Post-op Assessment: Report given to RN and Post -op Vital signs reviewed and stable  Post vital signs: Reviewed and stable  Last Vitals:  Vitals Value Taken Time  BP 114/75 10/06/24 11:15  Temp    Pulse 103 10/06/24 11:16  Resp 18 10/06/24 11:16  SpO2 96 % 10/06/24 11:16  Vitals shown include unfiled device data.  Last Pain:  Vitals:   10/06/24 0935  TempSrc: Oral  PainSc:       Patients Stated Pain Goal: 0 (10/03/24 1608)  Complications: No notable events documented.

## 2024-10-06 NOTE — Interval H&P Note (Signed)
 History and Physical Interval Note:  10/06/2024 6:41 AM  David King  has presented today for surgery, with the diagnosis of Compartment Syndrome Right Calf.  The various methods of treatment have been discussed with the patient and family. After consideration of risks, benefits and other options for treatment, the patient has consented to  Procedure(s) with comments: INCISION AND DRAINAGE OF DEEP ABSCESS, CALF (Right) - DEBRIDEMENT RIGHT LEG as a surgical intervention.  The patient's history has been reviewed, patient examined, no change in status, stable for surgery.  I have reviewed the patient's chart and labs.  Questions were answered to the patient's satisfaction.     Anson Peddie V Valentine Kuechle

## 2024-10-07 ENCOUNTER — Encounter (HOSPITAL_COMMUNITY): Payer: Self-pay | Admitting: Orthopedic Surgery

## 2024-10-07 LAB — BASIC METABOLIC PANEL WITH GFR
Anion gap: 11 (ref 5–15)
BUN: 35 mg/dL — ABNORMAL HIGH (ref 8–23)
CO2: 27 mmol/L (ref 22–32)
Calcium: 8.7 mg/dL — ABNORMAL LOW (ref 8.9–10.3)
Chloride: 96 mmol/L — ABNORMAL LOW (ref 98–111)
Creatinine, Ser: 1.49 mg/dL — ABNORMAL HIGH (ref 0.61–1.24)
GFR, Estimated: 50 mL/min — ABNORMAL LOW (ref >60)
Glucose, Bld: 122 mg/dL — ABNORMAL HIGH (ref 70–99)
Potassium: 4.5 mmol/L (ref 3.5–5.1)
Sodium: 134 mmol/L — ABNORMAL LOW (ref 135–145)

## 2024-10-07 LAB — CBC WITH DIFFERENTIAL/PLATELET
Abs Immature Granulocytes: 1.07 K/uL — ABNORMAL HIGH (ref 0.00–0.07)
Basophils Absolute: 0.1 K/uL (ref 0.0–0.1)
Basophils Relative: 1 %
Eosinophils Absolute: 0 K/uL (ref 0.0–0.5)
Eosinophils Relative: 0 %
HCT: 28.3 % — ABNORMAL LOW (ref 39.0–52.0)
Hemoglobin: 9.2 g/dL — ABNORMAL LOW (ref 13.0–17.0)
Immature Granulocytes: 7 %
Lymphocytes Relative: 9 %
Lymphs Abs: 1.3 K/uL (ref 0.7–4.0)
MCH: 31 pg (ref 26.0–34.0)
MCHC: 32.5 g/dL (ref 30.0–36.0)
MCV: 95.3 fL (ref 80.0–100.0)
Monocytes Absolute: 0.9 K/uL (ref 0.1–1.0)
Monocytes Relative: 6 %
Neutro Abs: 11.3 K/uL — ABNORMAL HIGH (ref 1.7–7.7)
Neutrophils Relative %: 77 %
Platelets: 266 K/uL (ref 150–400)
RBC: 2.97 MIL/uL — ABNORMAL LOW (ref 4.22–5.81)
RDW: 16.9 % — ABNORMAL HIGH (ref 11.5–15.5)
WBC: 14.6 K/uL — ABNORMAL HIGH (ref 4.0–10.5)
nRBC: 0 % (ref 0.0–0.2)

## 2024-10-07 LAB — CBC
HCT: 25.5 % — ABNORMAL LOW (ref 39.0–52.0)
Hemoglobin: 8 g/dL — ABNORMAL LOW (ref 13.0–17.0)
MCH: 30.8 pg (ref 26.0–34.0)
MCHC: 31.4 g/dL (ref 30.0–36.0)
MCV: 98.1 fL (ref 80.0–100.0)
Platelets: 256 K/uL (ref 150–400)
RBC: 2.6 MIL/uL — ABNORMAL LOW (ref 4.22–5.81)
RDW: 17.1 % — ABNORMAL HIGH (ref 11.5–15.5)
WBC: 18.5 K/uL — ABNORMAL HIGH (ref 4.0–10.5)
nRBC: 0 % (ref 0.0–0.2)

## 2024-10-07 LAB — HEPARIN LEVEL (UNFRACTIONATED)
Heparin Unfractionated: 0.56 [IU]/mL (ref 0.30–0.70)
Heparin Unfractionated: 0.55 [IU]/mL (ref 0.30–0.70)

## 2024-10-07 LAB — MAGNESIUM: Magnesium: 2.2 mg/dL (ref 1.7–2.4)

## 2024-10-07 LAB — LACTIC ACID, PLASMA: Lactic Acid, Venous: 3.5 mmol/L (ref 0.5–1.9)

## 2024-10-07 LAB — VITAMIN A: Vitamin A (Retinoic Acid): 35.3 ug/dL (ref 22.0–69.5)

## 2024-10-07 LAB — PHOSPHORUS: Phosphorus: 3.3 mg/dL (ref 2.5–4.6)

## 2024-10-07 LAB — VITAMIN E
Vitamin E (Alpha Tocopherol): 8.6 mg/L — ABNORMAL LOW (ref 9.0–29.0)
Vitamin E(Gamma Tocopherol): 1.9 mg/L (ref 0.5–4.9)

## 2024-10-07 MED ORDER — SODIUM CHLORIDE 0.9 % IV BOLUS: 1000.0000 mL | Freq: Once | INTRAVENOUS | Status: DC

## 2024-10-07 MED ORDER — SODIUM CHLORIDE 0.9 % IV BOLUS
1000.0000 mL | Freq: Once | INTRAVENOUS | Status: AC
Start: 2024-10-07 — End: 2024-10-07
  Administered 2024-10-07: 1000 mL via INTRAVENOUS

## 2024-10-07 MED ORDER — ORAL CARE MOUTH RINSE: 15.0000 mL | OROMUCOSAL | Status: DC | PRN

## 2024-10-07 NOTE — Progress Notes (Addendum)
 [facility]   S/P excisional debridement right calf due to compartment syndrome with aortic occlusion disease s/p right axillary bifemoral bypass graft for right leg ischemia  Vac with 100 cc drainage total since surgery.  SS drainage.   Vac with good suction.    Continue antibiotics pending cultures  No growth to date Anticipate patient could discharge at this time to skilled nursing will discharge with the Prevena plus portable wound VAC pump.   Resting comfortably this am After discharge f/u with Dr. Harden 1 week after discharge    Maurilio Deland Collet PA-C

## 2024-10-07 NOTE — Progress Notes (Signed)
 PHARMACY - ANTICOAGULATION CONSULT NOTE  Pharmacy Consult for Heparin  Indication:  arterial insufficiency / occlusion  Allergies  Allergen Reactions   Ancef  [Cefazolin ] Anaphylaxis    Patient Measurements: Height: 5' 9 (175.3 cm) Weight: 85.7 kg (189 lb) IBW/kg (Calculated) : 70.7 HEPARIN  DW (KG): 83.2 Heparin  dosing weight = 83 kg  Vital Signs: Temp: 97.8 F (36.6 C) (10/08 2001) Temp Source: Oral (10/08 2001) BP: 109/68 (10/09 0500)  Labs: Recent Labs    10/05/24 0354 10/05/24 1636 10/06/24 0344 10/06/24 2048 10/07/24 0313  HGB 10.4*  --  9.3*  --  9.2*  HCT 32.1*  --  28.4*  --  28.3*  PLT 241  --  258  --  266  HEPARINUNFRC 0.56   < > 0.45 0.32 0.56   < > = values in this interval not displayed.    Estimated Creatinine Clearance: 64.8 mL/min (by C-G formula based on SCr of 1.15 mg/dL).  Assessment: 22 YOM admitted w/ severe vascular insufficiency and compartment syndrome s/p right axillary bifemoral bypass c/b right popliteal thrombus now s/p thrombectomy and bypass. No PTA AC noted through a search of his EMR. Pharmacy consulted to start heparin  therapy.   Patient is s/p R calf lateral fasciotomy washout, debridement of skin and soft tissue on 9/29 and I&D on 10/1 and 10/8.  Heparin  level 0.56 is just supratherapeutic on 1750units/hr. Hgb stable.  No issues with infusion or bleeding per RN. Wound vac output is minimally bloody, serosanguinous.  Goal of Therapy:  Heparin  level 0.3-0.5 units/ml Monitor platelets by anticoagulation protocol: Yes   Plan:  Decrease heparin  infusion at 1700 units/hr Check heparin  level in 8 hours  Monitor daily heparin  level, CBC, signs/symptoms of bleeding  F/u OR plans for further debridement, long term anticoagulation plan  Thank you for allowing pharmacy to be a part of this patient's care.  Jinnie Door, PharmD, BCPS, BCCP Clinical Pharmacist  Please check AMION for all [facility] Pharmacy phone numbers After 10:00 PM, call  Main Pharmacy [phone]

## 2024-10-07 NOTE — Anesthesia Postprocedure Evaluation (Signed)
 Anesthesia Post Note  Patient: David King  Procedure(s) Performed: INCISION AND DRAINAGE OF DEEP ABSCESS, CALF RIGHT (Right: Leg Lower)     Patient location during evaluation: PACU Anesthesia Type: General Level of consciousness: awake and alert Pain management: pain level controlled Vital Signs Assessment: post-procedure vital signs reviewed and stable Respiratory status: spontaneous breathing, nonlabored ventilation, respiratory function stable and patient connected to nasal cannula oxygen  Cardiovascular status: blood pressure returned to baseline and stable Postop Assessment: no apparent nausea or vomiting Anesthetic complications: no   No notable events documented.  Last Vitals:  Vitals:   10/07/24 0858 10/07/24 1100  BP: 101/76 99/65  Pulse: 83 95  Resp: 16 18  Temp: 36.4 C 36.6 C  SpO2: 91% 91%    Last Pain:  Vitals:   10/07/24 1100  TempSrc: Oral  PainSc:                  Asaiah Hunnicutt S

## 2024-10-07 NOTE — Progress Notes (Signed)
 PHARMACY - ANTICOAGULATION CONSULT NOTE  Pharmacy Consult for Heparin  Indication:  arterial insufficiency / occlusion  Allergies  Allergen Reactions   Ancef  [Cefazolin ] Anaphylaxis    Patient Measurements: Height: 5' 9 (175.3 cm) Weight: 85.7 kg (189 lb) IBW/kg (Calculated) : 70.7 HEPARIN  DW (KG): 83.2 Heparin  dosing weight = 83 kg  Vital Signs: Temp: 97.7 F (36.5 C) (10/09 1604) Temp Source: Oral (10/09 1604) BP: 107/79 (10/09 1604) Pulse Rate: 105 (10/09 1604)  Labs: Recent Labs    10/05/24 0354 10/05/24 1636 10/06/24 0344 10/06/24 2048 10/07/24 0313 10/07/24 1532  HGB 10.4*  --  9.3*  --  9.2*  --   HCT 32.1*  --  28.4*  --  28.3*  --   PLT 241  --  258  --  266  --   HEPARINUNFRC 0.56   < > 0.45 0.32 0.56 0.55   < > = values in this interval not displayed.    Estimated Creatinine Clearance: 64.8 mL/min (by C-G formula based on SCr of 1.15 mg/dL).  Assessment: 56 YOM admitted w/ severe vascular insufficiency and compartment syndrome s/p right axillary bifemoral bypass c/b right popliteal thrombus now s/p thrombectomy and bypass. No PTA AC noted through a search of his EMR. Pharmacy consulted to start heparin  therapy.   Patient is s/p R calf lateral fasciotomy washout, debridement of skin and soft tissue on 9/29 and I&D on 10/1 and 10/8.  Heparin  level 0.56 is just supratherapeutic on 1750units/hr. Hgb stable.  No issues with infusion or bleeding per RN. Wound vac output is minimally bloody, serosanguinous.  PM update: HL remains slightly supratherapeutic at 0.55 on 1700 units/hr. No issues with the infusion or bleeding reported per RN.  Goal of Therapy:  Heparin  level 0.3-0.5 units/ml Monitor platelets by anticoagulation protocol: Yes   Plan:  Decrease heparin  infusion at 1600 units/hr Check heparin  level in 8 hours  Monitor daily heparin  level, CBC, signs/symptoms of bleeding  F/u OR plans for further debridement, long term anticoagulation  plan  Thank you for allowing pharmacy to be a part of this patient's care.  Rocky Slade, PharmD, BCPS Clinical Pharmacist  Please check AMION for all [facility] Pharmacy phone numbers After 10:00 PM, call Main Pharmacy [phone]

## 2024-10-07 NOTE — Progress Notes (Signed)
 IP rehab admissions - I met with patient today to reconfirm that he does want CIR once he is medically ready for discharge.  Noted VAC and to OR yesterday.  Not ready yet for inpatient rehab.  Will continue to follow.  [phone]

## 2024-10-07 NOTE — Progress Notes (Signed)
 Occupational Therapy Treatment Patient Details Name: David King MRN: [id number] DOB: 1956/11/14 Today's Date: 10/07/2024   History of present illness 68 y.o. 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 Pt went to OR x2 on 9/26 initially for R axial bypass, bilateral femoral thrombectomy and RLE fasciotomy and later for redo RLE thrombectomy. Pt recently underwent L4-5 decompression and fusion on 9/10 with anaphylactic reaction during surgery requiring ICU stay.  09/29/2024 excisional debridement skin and soft tissue muscle and fascia anterior compartment of the right leg transfer of the lateral compartment muscles to the anterior compartment to cover the anterior tibial vessels.  Dr. Harden  10/02/2019 5 repeat debridement with vascular I&D and excisional debridement  Plan for serial debridement again on October 8. PMHx: COPD, CAD, PAD, tobacco use, lumbar radiculopathy, detached retina L eye   OT comments  Pt progressing well towards goals. Progressed to complete step pivot transfer with max +2 assist with cueing for steps and to maintain trunk ext. Pt continues to be limited by decreased strength and balance. Continue to recommend >3 hours of skilled rehab daily to optimize independence levels. Will continue to follow acutely.       If plan is discharge home, recommend the following:  Assistance with cooking/housework;Assist for transportation;Two people to help with walking and/or transfers;Two people to help with bathing/dressing/bathroom   Equipment Recommendations  None recommended by OT    Recommendations for Other Services Rehab consult    Precautions / Restrictions Precautions Precautions: Fall;Back;Other (comment) Precaution Booklet Issued: Yes (comment) Recall of Precautions/Restrictions: Intact Precaution/Restrictions Comments: watch BP (orthostatic); RLE  foot drop Required Braces or Orthoses: Spinal Brace Spinal Brace: Thoracolumbosacral orthotic;Applied in sitting position       Mobility Bed Mobility Overal bed mobility: Needs Assistance Bed Mobility: Rolling, Sidelying to Sit Rolling: Supervision Sidelying to sit: Supervision       General bed mobility comments: S for safety, good recall of log roll technique    Transfers Overall transfer level: Needs assistance Equipment used: Rolling walker (2 wheels) Transfers: Sit to/from Stand, Bed to chair/wheelchair/BSC Sit to Stand: Mod assist, +2 physical assistance, +2 safety/equipment     Step pivot transfers: Max assist, +2 physical assistance, +2 safety/equipment     General transfer comment: Pt with good muscle to power up with min-mod +2 assist, during step pivot pt with knee buckling, requiring max+2 assist to complete step pivot     Balance Overall balance assessment: Needs assistance Sitting-balance support: Feet supported, No upper extremity supported Sitting balance-Leahy Scale: Fair     Standing balance support: Bilateral upper extremity supported, Reliant on assistive device for balance Standing balance-Leahy Scale: Poor Standing balance comment: Reliant on RW and external support       ADL either performed or assessed with clinical judgement   ADL Overall ADL's : Needs assistance/impaired       Lower Body Dressing: Maximal assistance;Bed level;+2 for safety/equipment Lower Body Dressing Details (indicate cue type and reason): To don brief with rolling Toilet Transfer: Maximal assistance;+2 for physical assistance;+2 for safety/equipment;Rolling walker (2 wheels);Stand-pivot Statistician Details (indicate cue type and reason): Stand, step pivot         Functional mobility during ADLs: Maximal assistance;+2 for physical assistance;+2 for safety/equipment;Rolling walker (2 wheels)      Extremity/Trunk Assessment Upper Extremity Assessment Upper  Extremity Assessment: Generalized weakness   Lower Extremity Assessment Lower  Extremity Assessment: Defer to PT evaluation        Vision   Vision Assessment?: No apparent visual deficits         Communication Communication Communication: No apparent difficulties   Cognition Arousal: Lethargic Behavior During Therapy: Flat affect Cognition: Cognition impaired       Memory impairment (select all impairments): Short-term memory Attention impairment (select first level of impairment): Sustained attention Executive functioning impairment (select all impairments): Problem solving OT - Cognition Comments: Flat affect, pt less coherent than previous session, will continue to evaluate     Following commands: Intact        Cueing   Cueing Techniques: Verbal cues, Tactile cues, Gestural cues        General Comments Pt pale noted soft bp in supine: 85/69, sitting 82/62, post transfer sitting 79/54. After 5 minute recovery 87/68    Pertinent Vitals/ Pain       Pain Assessment Pain Assessment: Faces Faces Pain Scale: Hurts whole lot Pain Location: RLE Pain Descriptors / Indicators: Discomfort, Grimacing, Moaning, Guarding Pain Intervention(s): Patient requesting pain meds-RN notified   Frequency  Min 2X/week        Progress Toward Goals  OT Goals(current goals can now be found in the care plan section)  Progress towards OT goals: Progressing toward goals  Acute Rehab OT Goals Patient Stated Goal: To get better OT Goal Formulation: With patient Time For Goal Achievement: 10/14/24 Potential to Achieve Goals: Good ADL Goals Pt Will Perform Lower Body Bathing: sitting/lateral leans;with set-up;with adaptive equipment Pt Will Perform Lower Body Dressing: with mod assist;sit to/from stand Pt Will Transfer to Toilet: with mod assist;stand pivot transfer;bedside commode Pt/caregiver will Perform Home Exercise Program: Increased ROM;Both right and left upper  extremity;Increased strength;With written HEP provided Additional ADL Goal #1: Pt will sit EOB independently for 3 meals/day as a precursor to OOB ADLs  Plan      Co-evaluation    PT/OT/SLP Co-Evaluation/Treatment: Yes Reason for Co-Treatment: For patient/therapist safety;To address functional/ADL transfers   OT goals addressed during session: ADL's and self-care      AM-PAC OT 6 Clicks Daily Activity     Outcome Measure   Help from another person eating meals?: None Help from another person taking care of personal grooming?: A Little Help from another person toileting, which includes using toliet, bedpan, or urinal?: Total Help from another person bathing (including washing, rinsing, drying)?: A Lot Help from another person to put on and taking off regular upper body clothing?: A Little Help from another person to put on and taking off regular lower body clothing?: A Lot 6 Click Score: 15    End of Session Equipment Utilized During Treatment: Gait belt;Rolling walker (2 wheels);Back brace;Oxygen   OT Visit Diagnosis: Unsteadiness on feet (R26.81);Other abnormalities of gait and mobility (R26.89);Muscle weakness (generalized) (M62.81);Pain Pain - Right/Left: Right Pain - part of body: Ankle and joints of foot   Activity Tolerance Patient tolerated treatment well   Patient Left in chair;with call bell/phone within reach;with chair alarm set   Nurse Communication Mobility status;Precautions        Time: 8581-8543 OT Time Calculation (min): 38 min  Charges: OT General Charges $OT Visit: 1 Visit OT Treatments $Self Care/Home Management : 8-22 mins  Adrianne BROCKS, OT  Acute Rehabilitation Services Office [phone] Secure chat preferred   Adrianne GORMAN Savers 10/07/2024, 3:14 PM

## 2024-10-07 NOTE — Progress Notes (Signed)
 PROGRESS NOTE    David King  FMW:[id number] DOB: [birth year] DOA: 09/24/2024 PCP: [name], MD   Brief Narrative:    68 y.o. male with PMH significant for HTN, HLD, retinal detachment, prostate cancer, PAD s/p b/l common [place] artery stenting, chronic back pain, lumbar radiculopathy.Because of progressive radiculopathy pain, patient was following up with orthopedics as an outpatient.  He had previous L4-L5 disc decompression.  Recent MRI showed severe stenosis of the left L4-L5 due to large disc herniation.   -- 09/08/2024, patient underwent revision L4-L5 decompression, lumbar body fusion by Dr. Beuford.  During the procedure, patient had an anaphylactic reaction to an unknown agent and developed hives with some hypotension. He required short-term intubation and transferred to ICU, post recovery he was noted to have some improvement in his left leg pain, postop site looked okay but he developed some right lower extremity weakness.  Neurology was consulted.  MRI brain was unremarkable.  EMG showed nonspecific changes.  -- 9/17, discharged to CIR. 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 -- 9/26, patient was readmitted to acute care with a plan of revascularization.  Patient underwent right axillary femoral bypass graft, bilateral common femoral, superficial femoral and profundofemoral thrombectomy, right leg anterior compartment fasciotomy due to compartment syndrome, right femoral endarterectomy. Soon after coming out of the OR, he was found to have decrease in his right lower extremity pulses again. Seen by vascular surgery, taken to the OR again.  Underwent right leg angiogram, found to have thrombus in the popliteal artery, underwent successful thrombectomy. Postop, admitted  to [facility] -- 9/29, underwent right calf lateral fasciotomy washout, skin debridement and wound VAC placement.  -09/28/2024 right calf lateral fasciotomy without any skin debridement and wound VAC placement - 09/29/2024 excisional debridement skin and soft tissue muscle and fascia anterior compartment of the right leg transfer of the lateral compartment muscles to the anterior compartment to cover the anterior tibial vessels.  Dr. Harden 10/01/2024: repeat debridement with vascular I&D and excisional debridement 10/8:Excisional debridement right calf with excision of skin soft tissue muscle and fascia. Local tissue transfer for wound closure 35 x 10 cm. Application Kerecis micro graft 95 cm x 2 and 38 cm x 1. 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  But right foot with no motor or sensory function.   09/29/2024 Dr. Harden S/P  Excisional debridement skin soft tissue muscle and fascia anterior compartment right leg.  Transfer of the lateral  compartment muscles  to the anterior compartment to cover the anterior tibial vessels.  Repeat debridement 10/01/2024 with vascular status post I&D and excisional debridement 10/8: Excisional debridement right calf with excision of skin soft tissue muscle and fascia. Local tissue transfer for wound closure 35 x 10 cm. Application Kerecis micro graft 95 cm x 2 and 38 cm x 1. Application of cleanse choice wound VAC sponges x 1 and peel in place wound VAC sponges x 2 Doppler to verify arterial competence He still does not have sensation in his right foot despite revascularization.   Lumbar radiculopathy S/p recent L4-L5 decompression/fusion -9/10 Dr Beuford Continue morphine  and OxyContin  and oxycodone  Lyrica    Acute blood loss anemia-hemoglobin dropped as low as 6.8 status post 1 unit of packed RBC.  AKI Resolved  with IV hydration.     Acute urinary retention Foul-smelling urine H/o prostate cancer he had Foley catheter inserted on admission.  This was removed on 27 September.  Since then he required multiple In-N-Out catheterization.  With routine reminders he is able to urinate without having a Foley in place.  Continue Flomax .   Hyponatremia Mild.  Continue to monitor.   Hyperlipidemia Crestor  20 mg daily   COPD Bronchodilators as needed   Diverticulosis with constipation  KUB done on 10/04/2024 showed no bowel obstruction.   Anxiety/Major moderate depression Celexa  20 mg daily, Cymbalta  nightly, Ambien  nightly as needed   Pressure injury bilateral buttocks and right heel deep tissue pressure injury seen by wound care.   Severe malnutrition due to acute illness as evidenced by moderate muscle depletion mild fat depletion.  Dietary consulted continue Ensure mighty shake Juven twice daily multivitamin.  Disposition: back to CIR  DVT prophylaxis: on IV Heparin      Code Status: Full Code Family Communication:  None at the bedside Status is: Inpatient Remains inpatient appropriate because:  LE debridements    Subjective:  Denied any active complaints except 7 out of 10 pain in his right leg.  Examination:  General exam: Appears calm and comfortable  Respiratory system: Clear to auscultation. Respiratory effort normal. Cardiovascular system: S1 & S2 heard, RRR. No JVD, murmurs, rubs, gallops or clicks. No pedal edema. Gastrointestinal system: Abdomen is nondistended, soft and nontender. No organomegaly or masses felt. Normal bowel sounds heard. Central nervous system: Alert and oriented.  Extremities: RLE dressing and wound vac x 2 in place   Wound 10/05/24 0815 Pressure Injury Buttocks Right;Left Stage 1 -  Intact skin with non-blanchable redness of a localized area usually over a bony prominence. (Active)     Diet Orders (From admission, onward)     Start     Ordered   10/06/24 1535  Diet regular Room service appropriate? Yes with Assist; Fluid consistency: Thin  Diet effective now       Question Answer Comment  Room service appropriate? Yes with Assist   Fluid consistency: Thin      10/06/24 1534            Objective: Vitals:   10/06/24 1515 10/06/24 2001 10/07/24 0500 10/07/24 0858  BP: 115/78 (!) 118/101 109/68 101/76  Pulse:    83  Resp: (!) 25 15 20 16   Temp: 97.9 F (36.6 C) 97.8 F (36.6 C)  97.6 F (36.4 C)  TempSrc: Oral Oral  Oral  SpO2: 92% 93% (!) 84% 91%  Weight:   85.7 kg   Height:        Intake/Output Summary (Last 24 hours) at 10/07/2024  1042 Last data filed at 10/06/2024 2100 Gross per 24 hour  Intake 873.48 ml  Output 1130 ml  Net -256.52 ml   Filed Weights   09/29/24 1049 10/01/24 1038 10/07/24 0500  Weight: 83.2 kg 83.2 kg 85.7 kg    Scheduled Meds:  aspirin  EC  81 mg Oral Daily   citalopram   20 mg Oral Daily   docusate sodium   100 mg Oral BID   DULoxetine   30 mg Oral QHS   feeding supplement  237 mL Oral BID BM   multivitamin with minerals  1 tablet Oral Daily   nutrition supplement (JUVEN)  1 packet Oral BID  BM   oxyCODONE   10 mg Oral Q12H   polyethylene glycol  17 g Oral BID   pregabalin   150 mg Oral TID   rosuvastatin   20 mg Oral Daily   senna-docusate  3 tablet Oral QHS   SMOG  300 mL Rectal Once   tamsulosin   0.4 mg Oral QPC supper   thiamine  100 mg Oral Daily   Continuous Infusions:  heparin  1,700 Units/hr (10/07/24 0800)   vancomycin       Nutritional status Signs/Symptoms: moderate muscle depletion, mild fat depletion, energy intake < or equal to 50% for > or equal to 5 days Interventions: Refer to RD note for recommendations Body mass index is 27.91 kg/m.  Data Reviewed:   CBC: Recent Labs  Lab 10/03/24 0300 10/04/24 0344 10/05/24 0354 10/06/24 0344 10/07/24 0313  WBC 12.3* 13.9* 18.6* 17.7* 14.6*  NEUTROABS  --   --   --   --  11.3*  HGB 9.5* 9.7* 10.4* 9.3* 9.2*  HCT 29.1* 29.8* 32.1* 28.4* 28.3*  MCV 96.4 95.2 95.5 94.0 95.3  PLT 177 208 241 258 266   Basic Metabolic Panel: Recent Labs  Lab 09/30/24 1531 10/01/24 0510 10/02/24 0353 10/03/24 0300  NA 135 137 134* 134*  K 3.7 3.6 4.2 4.1  CL 101 102 102 99  CO2 22 25 23 24   GLUCOSE 116* 93 113* 99  BUN 16 13 16  27*  CREATININE 1.21 1.07 1.06 1.15  CALCIUM  8.2* 8.5* 8.5* 8.7*   GFR: Estimated Creatinine Clearance: 64.8 mL/min (by C-G formula based on SCr of 1.15 mg/dL). Liver Function Tests: Recent Labs  Lab 09/30/24 1531  AST 45*  ALT 64*  ALKPHOS 90  BILITOT 0.8  PROT 5.7*  ALBUMIN  2.5*   No results for input(s): LIPASE, AMYLASE in the last 168 hours. No results for input(s): AMMONIA in the last 168 hours. Coagulation Profile: No results for input(s): INR, PROTIME in the last 168 hours. Cardiac Enzymes: No results for input(s): CKTOTAL, CKMB, CKMBINDEX, TROPONINI in the last 168 hours. BNP (last 3 results) No results for input(s): PROBNP in the last 8760 hours. HbA1C: No results for input(s): HGBA1C in the last 72 hours. CBG: Recent Labs  Lab 10/01/24 1628   GLUCAP 131*   Lipid Profile: No results for input(s): CHOL, HDL, LDLCALC, TRIG, CHOLHDL, LDLDIRECT in the last 72 hours. Thyroid Function Tests: No results for input(s): TSH, T4TOTAL, FREET4, T3FREE, THYROIDAB in the last 72 hours. Anemia Panel: No results for input(s): VITAMINB12, FOLATE, FERRITIN, TIBC, IRON, RETICCTPCT in the last 72 hours. Sepsis Labs: No results for input(s): PROCALCITON, LATICACIDVEN in the last 168 hours.  Recent Results (from the past 240 hours)  MRSA Next Gen by PCR, Nasal     Status: None   Collection Time: 10/05/24  7:36 PM   Specimen: Nasal Mucosa; Nasal  Swab  Result Value Ref Range Status   MRSA by PCR Next Gen NOT DETECTED NOT DETECTED Final    Comment: (NOTE) The GeneXpert MRSA Assay (FDA approved for NASAL specimens only), is one component of a comprehensive MRSA colonization surveillance program. It is not intended to diagnose MRSA infection nor to guide or monitor treatment for MRSA infections. Test performance is not FDA approved in patients less than 9 years old. Performed at [facility], [address] [place], [place] [address]   Aerobic/Anaerobic Culture w Gram Stain (surgical/deep wound)     Status: None (Preliminary result)   Collection Time: 10/06/24 10:23 AM   Specimen: Soft Tissue, Other  Result Value Ref Range Status   Specimen Description TISSUE  Final   Special Requests FASCIA RIGHT LEG  Final   Gram Stain NO WBC SEEN NO ORGANISMS SEEN   Final   Culture   Final    NO GROWTH < 24 HOURS Performed at [facility], [address], [place], [place] [address]    Report Status PENDING  Incomplete         Radiology Studies: DG Chest Port 1 View Result Date: 10/06/2024 CLINICAL DATA:  Hypoxia.  COPD. EXAM: PORTABLE CHEST 1 VIEW COMPARISON:  09/30/2024 FINDINGS: Normal sized heart. Interval mild-to-moderate right basilar atelectasis and minimal left basilar atelectasis. Stable  mild diffuse peribronchial thickening. Tortuous and partially calcified thoracic aorta. Stable midthoracic vertebral compression deformity with kyphoplasty material. IMPRESSION: 1. Interval mild-to-moderate right basilar atelectasis and minimal left basilar atelectasis. 2. Stable mild chronic bronchitic changes. Electronically Signed   By: Elspeth Bathe M.D.   On: 10/06/2024 12:21           LOS: 13 days   Time spent= 39 mins    Deliliah Room, MD [facility]  If 7PM-7AM, please contact night-coverage  10/07/2024, 10:42 AM

## 2024-10-07 NOTE — Progress Notes (Signed)
 Physical Therapy Treatment Patient Details Name: David King MRN: [id number] DOB: [birth year] Today's Date: 10/07/2024   History of Present Illness 68 y.o. 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 Pt went to OR x2 on 9/26 initially for R axial bypass, bilateral femoral thrombectomy and RLE fasciotomy and later for redo RLE thrombectomy. Pt recently underwent L4-5 decompression and fusion on 9/10 with anaphylactic reaction during surgery requiring ICU stay.  09/29/2024 excisional debridement skin and soft tissue muscle and fascia anterior compartment of the right leg transfer of the lateral compartment muscles to the anterior compartment to cover the anterior tibial vessels.  Dr. Harden  10/02/2019 5 repeat debridement with vascular I&D and excisional debridement  S/p serial debridement again on October 8. PMHx: COPD, CAD, PAD, tobacco use, lumbar radiculopathy, detached retina L eye    PT Comments  Pt received in supine, pleasantly agreeable to therapy session, HoH and slightly decreased awareness of time this date, but good participation with increased processing time needed at times. Pt needing up to +2 maxA to perform step pivot transfer from air bed>recliner on his R side using RW. Pt with BLE buckling and c/o fatigue and lightheadedness with pivotal transfer, note MAP low with improved BP reading after sitting ~5 mins and legs reclined. SpO2 intermittently desat to 86% on 2L O2 [place] with improvement when pt encouraged to attempt pursed-lip breathing. Patient will benefit from intensive inpatient follow-up therapy, >3 hours/day, pt beginning to make progress toward goals this date.  Vital Signs  Patient Position (if appropriate) Orthostatic Vitals  Orthostatic Lying   BP- Lying (!) 85/69  Orthostatic Sitting  BP- Sitting (!) 82/62 (EOB prior to standing)  Pulse- Sitting 119    Orthostatic Sitting  BP- Sitting (!) 79/54 (in chair after step pivot transfer)  Pulse- Sitting 128   Orthostatic Lying   BP- Lying (!) 87/68 (reclined in chair)  Pulse- Lying 115     If plan is discharge home, recommend the following: Assistance with cooking/housework;Assist for transportation;Help with stairs or ramp for entrance;Two people to help with walking and/or transfers;A lot of help with bathing/dressing/bathroom   Can travel by private vehicle        Equipment Recommendations  Wheelchair (measurements PT);Wheelchair cushion (measurements PT);BSC/3in1 (consider hoyer pending progress)    Recommendations for Other Services       Precautions / Restrictions Precautions Precautions: Fall;Back;Other (comment) Precaution Booklet Issued: Yes (comment) Recall of Precautions/Restrictions: Intact Precaution/Restrictions Comments: watch BP (orthostatic); RLE foot drop Required Braces or Orthoses: Spinal Brace Spinal Brace: Thoracolumbosacral orthotic;Applied in sitting position Restrictions Weight Bearing Restrictions Per Provider Order: Yes RLE Weight Bearing Per Provider Order: Weight bearing as tolerated     Mobility  Bed Mobility Overal bed mobility: Needs Assistance Bed Mobility: Rolling, Sidelying to Sit Rolling: Supervision Sidelying to sit: Supervision       General bed mobility comments: S for safety, good recall of log roll technique    Transfers Overall transfer level: Needs assistance Equipment used: Rolling walker (2 wheels) Transfers: Sit to/from Stand, Bed to chair/wheelchair/BSC Sit to Stand: Mod assist, +2 physical assistance, +2 safety/equipment, From elevated surface   Step pivot transfers: Max assist, +2 physical assistance, +2 safety/equipment, From elevated surface       General transfer comment: Pt with good muscle to power up with min-mod +2 assist from air bed and chair surfaces, during step  pivot pt with knee buckling, requiring  max+2 assist to complete step pivot and bil knees blocked at times.    Ambulation/Gait Ambulation/Gait assistance: Max assist, +2 physical assistance Gait Distance (Feet): 4 Feet Assistive device: Rolling walker (2 wheels) Gait Pattern/deviations: Shuffle, Decreased dorsiflexion - right, Knee flexed in stance - right, Knee flexed in stance - left, Knees buckling, Step-to pattern, Narrow base of support       General Gait Details: Decreased bil knee extension in stance phase, pt needs AA for RLE while abducting for pivotal steps and R and at times L knee blocked due to pt c/f buckling/weakness, also assist for RW management and steadying assist. Stepping toward chair on his R side and posterior steps a couple feet.   Stairs Stairs:  (not safe to attempt today 2/2 low BP/weakness)           Wheelchair Mobility     Tilt Bed    Modified Rankin (Stroke Patients Only)       Balance Overall balance assessment: Needs assistance Sitting-balance support: Feet supported, No upper extremity supported Sitting balance-Leahy Scale: Fair     Standing balance support: Bilateral upper extremity supported, Reliant on assistive device for balance Standing balance-Leahy Scale: Poor Standing balance comment: Reliant on RW and external support                            Communication Communication Communication: No apparent difficulties  Cognition Arousal: Lethargic Behavior During Therapy: Flat affect   PT - Cognitive impairments: Memory, Orientation, Attention, Sequencing, Problem solving, Safety/Judgement   Orientation impairments: Time                   PT - Cognition Comments: Slower processing this date, increased time to perform tasks., HoH and often mishearing therapist directions/cues in ways that don't really make sense. Following commands: Intact      Cueing Cueing Techniques: Verbal cues, Tactile cues, Gestural cues  Exercises Other Exercises Other  Exercises: standing RLE AROM: hip flexion x10 reps with L knee blocked Other Exercises: supine RLE AROM: heel slides, SLR (AA) x10 reps ea Other Exercises: supine LLE AROM and RLE PROM: ankle pumps x5 reps    General Comments General comments (skin integrity, edema, etc.): see BP in comments above; remains hypotensive throughout, more hypotensive after step pivot to chair.      Pertinent Vitals/Pain Pain Assessment Pain Assessment: Faces Faces Pain Scale: Hurts whole lot Pain Location: RLE Pain Descriptors / Indicators: Discomfort, Grimacing, Moaning, Guarding Pain Intervention(s): Limited activity within patient's tolerance, Monitored during session, Repositioned, Patient requesting pain meds-RN notified    Home Living                          Prior Function            PT Goals (current goals can now be found in the care plan section) Acute Rehab PT Goals Patient Stated Goal: to return to independence and reduce pain PT Goal Formulation: With patient Time For Goal Achievement: 10/09/24 Progress towards PT goals: Progressing toward goals    Frequency    Min 3X/week      PT Plan      Co-evaluation PT/OT/SLP Co-Evaluation/Treatment: Yes Reason for Co-Treatment: For patient/therapist safety;To address functional/ADL transfers;Complexity of the patient's impairments (multi-system involvement) PT goals addressed during session: Mobility/safety with mobility;Balance;Proper use of DME;Strengthening/ROM OT goals addressed during session: ADL's and  self-care      AM-PAC PT 6 Clicks Mobility   Outcome Measure  Help needed turning from your back to your side while in a flat bed without using bedrails?: A Little Help needed moving from lying on your back to sitting on the side of a flat bed without using bedrails?: A Little Help needed moving to and from a bed to a chair (including a wheelchair)?: Total Help needed standing up from a chair using your arms (e.g.,  wheelchair or bedside chair)?: A Lot Help needed to walk in hospital room?: Total Help needed climbing 3-5 steps with a railing? : Total 6 Click Score: 11    End of Session Equipment Utilized During Treatment: Gait belt;Back brace;Oxygen  Activity Tolerance: Patient tolerated treatment well;Patient limited by fatigue;Treatment limited secondary to medical complications (Comment);Other (comment) (symptomatic drop in BP after pivot to chair; improved with legs reclined; watch O2 on 2L [place]) Patient left: in chair;with call bell/phone within reach;with chair alarm set;Other (comment) (Prevalon boots donned bil) Nurse Communication: Mobility status;Need for lift equipment;Precautions;Other (comment) (O2 sats, BP in chair, use Stedy to get him back, pt has briefs donned for comfort while up in chair) PT Visit Diagnosis: Other abnormalities of gait and mobility (R26.89);Muscle weakness (generalized) (M62.81);Other symptoms and signs involving the nervous system (R29.898)     Time: 8581-8542 PT Time Calculation (min) (ACUTE ONLY): 39 min  Charges:    $Therapeutic Exercise: 8-22 mins $Therapeutic Activity: 8-22 mins PT General Charges $$ ACUTE PT VISIT: 1 Visit                     Kalynne Womac P., PTA Acute Rehabilitation Services Secure Chat Preferred 9a-5:30pm Office: [phone]    Connell HERO [facility] 10/07/2024, 4:01 PM

## 2024-10-08 ENCOUNTER — Inpatient Hospital Stay (HOSPITAL_COMMUNITY)

## 2024-10-08 DIAGNOSIS — I739 Peripheral vascular disease, unspecified: Secondary | ICD-10-CM | POA: Diagnosis not present

## 2024-10-08 LAB — BASIC METABOLIC PANEL WITH GFR
Anion gap: 11 (ref 5–15)
BUN: 28 mg/dL — ABNORMAL HIGH (ref 8–23)
CO2: 22 mmol/L (ref 22–32)
Calcium: 7.7 mg/dL — ABNORMAL LOW (ref 8.9–10.3)
Chloride: 99 mmol/L (ref 98–111)
Creatinine, Ser: 1.11 mg/dL (ref 0.61–1.24)
GFR, Estimated: >60 mL/min (ref >60)
Glucose, Bld: 102 mg/dL — ABNORMAL HIGH (ref 70–99)
Potassium: 3.8 mmol/L (ref 3.5–5.1)
Sodium: 132 mmol/L — ABNORMAL LOW (ref 135–145)

## 2024-10-08 LAB — CBC WITH DIFFERENTIAL/PLATELET
Abs Immature Granulocytes: 0.83 K/uL — ABNORMAL HIGH (ref 0.00–0.07)
Basophils Absolute: 0.1 K/uL (ref 0.0–0.1)
Basophils Relative: 1 %
Eosinophils Absolute: 0.1 K/uL (ref 0.0–0.5)
Eosinophils Relative: 1 %
HCT: 22.5 % — ABNORMAL LOW (ref 39.0–52.0)
Hemoglobin: 7.2 g/dL — ABNORMAL LOW (ref 13.0–17.0)
Immature Granulocytes: 6 %
Lymphocytes Relative: 13 %
Lymphs Abs: 1.9 K/uL (ref 0.7–4.0)
MCH: 30.9 pg (ref 26.0–34.0)
MCHC: 32 g/dL (ref 30.0–36.0)
MCV: 96.6 fL (ref 80.0–100.0)
Monocytes Absolute: 1.2 K/uL — ABNORMAL HIGH (ref 0.1–1.0)
Monocytes Relative: 8 %
Neutro Abs: 10.2 K/uL — ABNORMAL HIGH (ref 1.7–7.7)
Neutrophils Relative %: 71 %
Platelets: 202 K/uL (ref 150–400)
RBC: 2.33 MIL/uL — ABNORMAL LOW (ref 4.22–5.81)
RDW: 16.8 % — ABNORMAL HIGH (ref 11.5–15.5)
Smear Review: NORMAL
WBC: 14.3 K/uL — ABNORMAL HIGH (ref 4.0–10.5)
nRBC: 0 % (ref 0.0–0.2)

## 2024-10-08 LAB — HEPARIN LEVEL (UNFRACTIONATED)
Heparin Unfractionated: 0.51 [IU]/mL (ref 0.30–0.70)
Heparin Unfractionated: 0.31 [IU]/mL (ref 0.30–0.70)

## 2024-10-08 LAB — LACTIC ACID, PLASMA: Lactic Acid, Venous: 1.2 mmol/L (ref 0.5–1.9)

## 2024-10-08 LAB — PREPARE RBC (CROSSMATCH)

## 2024-10-08 LAB — CK: Total CK: 69 U/L (ref 49–397)

## 2024-10-08 MED ORDER — POLYETHYLENE GLYCOL 3350 17 G PO PACK
17.0000 g | PACK | Freq: Every day | ORAL | Status: DC
  Filled 2024-10-08 (×3): qty 1

## 2024-10-08 MED ORDER — DOXYCYCLINE HYCLATE 100 MG PO TABS
100.0000 mg | ORAL_TABLET | Freq: Two times a day (BID) | ORAL | Status: DC
  Administered 2024-10-08 – 2024-10-11 (×7): 100 mg via ORAL
  Filled 2024-10-08 (×7): qty 1

## 2024-10-08 MED ORDER — APIXABAN 5 MG PO TABS: 5.0000 mg | ORAL_TABLET | Freq: Two times a day (BID) | ORAL | Status: DC

## 2024-10-08 MED ORDER — SENNOSIDES-DOCUSATE SODIUM 8.6-50 MG PO TABS
1.0000 | ORAL_TABLET | Freq: Every day | ORAL | Status: DC
  Administered 2024-10-08 – 2024-10-10 (×3): 1 via ORAL
  Filled 2024-10-08 (×3): qty 1

## 2024-10-08 MED ORDER — SODIUM CHLORIDE 0.9 % IV SOLN: INTRAVENOUS | Status: AC

## 2024-10-08 MED ORDER — ZINC SULFATE 220 (50 ZN) MG PO CAPS
220.0000 mg | ORAL_CAPSULE | Freq: Every day | ORAL | Status: DC
  Administered 2024-10-08 – 2024-10-11 (×4): 220 mg via ORAL
  Filled 2024-10-08 (×4): qty 1

## 2024-10-08 MED ORDER — VITAMIN C 500 MG PO TABS
500.0000 mg | ORAL_TABLET | Freq: Every day | ORAL | Status: DC
  Administered 2024-10-08 – 2024-10-11 (×4): 500 mg via ORAL
  Filled 2024-10-08 (×4): qty 1

## 2024-10-08 MED ORDER — BUDESON-GLYCOPYRROL-FORMOTEROL 160-9-4.8 MCG/ACT IN AERO
2.0000 | INHALATION_SPRAY | Freq: Two times a day (BID) | RESPIRATORY_TRACT | Status: DC
  Administered 2024-10-08 – 2024-10-11 (×5): 2 via RESPIRATORY_TRACT
  Filled 2024-10-08 (×2): qty 5.9

## 2024-10-08 MED ORDER — SODIUM CHLORIDE 0.9% IV SOLUTION: Freq: Once | INTRAVENOUS | Status: AC

## 2024-10-08 MED ORDER — HEPARIN (PORCINE) 25000 UT/250ML-% IV SOLN
1450.0000 [IU]/h | INTRAVENOUS | Status: DC
  Administered 2024-10-08: 1500 [IU]/h via INTRAVENOUS
  Administered 2024-10-09: 1450 [IU]/h via INTRAVENOUS
  Administered 2024-10-09: 1550 [IU]/h via INTRAVENOUS
  Filled 2024-10-08 (×3): qty 250

## 2024-10-08 MED ORDER — KETOROLAC TROMETHAMINE 15 MG/ML IJ SOLN
15.0000 mg | Freq: Once | INTRAMUSCULAR | Status: AC
  Administered 2024-10-08: 15 mg via INTRAVENOUS
  Filled 2024-10-08: qty 1

## 2024-10-08 MED ORDER — SODIUM CHLORIDE 0.9 % IV BOLUS
500.0000 mL | Freq: Once | INTRAVENOUS | Status: AC
  Administered 2024-10-08: 500 mL via INTRAVENOUS

## 2024-10-08 NOTE — Progress Notes (Signed)
 Patient was again evaluated at his bedside.  He continues to have pain primarily in the right heel.  He has been up out of bed at least twice per day.  He states that he does have an upcoming surgery scheduled with Dr. Harden next week.  Standpoint of his lumbar surgery, no changes are needed to the current plan.  He is aware of his restrictions.  He does understand that this is primarily a social visit, and I will continue to follow his progress.  Unfortunately, I do not see his wife today, but I did ask him to give her my best.  I am going to order 2 views of his lumbar spine today to evaluate the progress of his fusion.

## 2024-10-08 NOTE — Progress Notes (Signed)
 Repeat Lactic acid 1.2 per On call MD no additional bolus fluids needed, administer maintenance NS IV at 100 ml/hr.

## 2024-10-08 NOTE — Care Management Important Message (Signed)
 Important Message  Patient Details  Name: MUSA REWERTS MRN: [id number] Date of Birth: [birth year]   Important Message Given:  Yes - Medicare IM     Vonzell Arrie Sharps 10/08/2024, 10:05 AM

## 2024-10-08 NOTE — Progress Notes (Signed)
 [facility]   Awake sitting up eating breakfast Pain over night and BP machine alarmed all night Wound vac to good suction Hypotension 90's over 60's-70's, HR 90 bpm HGB 7.2 from 8.0 on 10/07/24.  Pharmacy ststes th HGB is dilution after 250 fluid bolus for hypotension.  Vac OP total 125 cc, 10/07/24 100 cc total Op SS Vac with good suction Doppler DP/PT signals intact  Medical team has ordered Repeat type and screen  AKI, Cr up to 1.49  Keep antibiotics same for now with vancomycin , levofloxacin.  Tissue cultures no growth to date I will hold the Eliquis for now and keep Heparin .  Changes per Primary team.   Maurilio Deland Collet PA-C

## 2024-10-08 NOTE — Progress Notes (Addendum)
 PHARMACY - ANTICOAGULATION CONSULT NOTE  Pharmacy Consult for Heparin  Indication:  arterial insufficiency / occlusion  Allergies  Allergen Reactions   Ancef  [Cefazolin ] Anaphylaxis    Patient Measurements: Height: 5' 9 (175.3 cm) Weight: 88.5 kg (195 lb) IBW/kg (Calculated) : 70.7 HEPARIN  DW (KG): 83.2 Heparin  dosing weight = 83 kg  Vital Signs: Temp: 98.1 F (36.7 C) (10/10 1136) Temp Source: Oral (10/10 1136) BP: 83/65 (10/10 1136) Pulse Rate: 93 (10/10 1136)  Labs: Recent Labs    10/07/24 0313 10/07/24 1532 10/07/24 2206 10/08/24 0045 10/08/24 0353 10/08/24 0947  HGB 9.2*  --  8.0*  --  7.2*  --   HCT 28.3*  --  25.5*  --  22.5*  --   PLT 266  --  256  --  202  --   HEPARINUNFRC 0.56 0.55  --  0.51  --  0.31  CREATININE  --   --  1.49*  --  1.11  --   CKTOTAL  --   --  69  --   --   --     Estimated Creatinine Clearance: 68.1 mL/min (by C-G formula based on SCr of 1.11 mg/dL).  Assessment: 56 YOM admitted w/ severe vascular insufficiency and compartment syndrome s/p right axillary bifemoral bypass c/b right popliteal thrombus now s/p thrombectomy and bypass. No PTA AC noted through a search of his EMR. Pharmacy consulted to start heparin  therapy.   Patient is s/p R calf lateral fasciotomy washout, debridement of skin and soft tissue on 9/29 and I&D on 10/1 and 10/8.   Per ortho, ok to stop heparin  and start apixaban.  Goal of Therapy:  Heparin  level 0.3-0.5 units/ml Monitor platelets by anticoagulation protocol: Yes   Plan:  Stop heparin  Start apixaban 5mg  BID  Monitor signs/symptoms of bleeding   ADDENDUM 09:40: Per ortho, switch back to heparin  in case Hgb drop is not only due to IVF. Apixaban not given yet. Resume heparin  1500 units/hr. F/u heparin  level now (Was due at 09:00).  Heparin  level 0.31 is at low end of therapeutic on 1500 units/hr.  Plan: Increase heparin  to 1550 units/hr Monitor daily heparin  level, CBC, signs/symptoms of bleeding   F/u switch to apixaban    Thank you for allowing pharmacy to be a part of this patient's care.  Jinnie Door, PharmD, BCPS, BCCP Clinical Pharmacist  Please check AMION for all [facility] Pharmacy phone numbers After 10:00 PM, call Main Pharmacy [phone]

## 2024-10-08 NOTE — Progress Notes (Signed)
 PROGRESS NOTE    David King  FMW:[id number] DOB: Feb 19, 1956 DOA: 09/24/2024 PCP: [name], MD   Brief Narrative:    68 y.o. male with PMH significant for HTN, HLD, retinal detachment, prostate cancer, PAD s/p b/l common [place] artery stenting, chronic back pain, lumbar radiculopathy.Because of progressive radiculopathy pain, patient was following up with orthopedics as an outpatient.  He had previous L4-L5 disc decompression.  Recent MRI showed severe stenosis of the left L4-L5 due to large disc herniation.   -- 09/08/2024, patient underwent revision L4-L5 decompression, lumbar body fusion by Dr. Beuford.  During the procedure, patient had an anaphylactic reaction to an unknown agent and developed hives with some hypotension. He required short-term intubation and transferred to ICU, post recovery he was noted to have some improvement in his left leg pain, postop site looked okay but he developed some right lower extremity weakness.  Neurology was consulted.  MRI brain was unremarkable.  EMG showed nonspecific changes.  -- 9/17, discharged to CIR. 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 -- 9/26, patient was readmitted to acute care with a plan of revascularization.  Patient underwent right axillary femoral bypass graft, bilateral common femoral, superficial femoral and profundofemoral thrombectomy, right leg anterior compartment fasciotomy due to compartment syndrome, right femoral endarterectomy. Soon after coming out of the OR, he was found to have decrease in his right lower extremity pulses again. Seen by vascular surgery, taken to the OR again.  Underwent right leg angiogram, found to have thrombus in the popliteal artery, underwent successful thrombectomy. Postop, admitted  to [facility] -- 9/29, underwent right calf lateral fasciotomy washout, skin debridement and wound VAC placement.  -09/28/2024 right calf lateral fasciotomy without any skin debridement and wound VAC placement - 09/29/2024 excisional debridement skin and soft tissue muscle and fascia anterior compartment of the right leg transfer of the lateral compartment muscles to the anterior compartment to cover the anterior tibial vessels.  Dr. Harden 10/01/2024: repeat debridement with vascular I&D and excisional debridement 10/8:Excisional debridement right calf with excision of skin soft tissue muscle and fascia. Local tissue transfer for wound closure 35 x 10 cm. Application Kerecis micro graft 95 cm x 2 and 38 cm x 1. 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  But right foot with no motor or sensory function.   09/29/2024 Dr. Harden S/P  Excisional debridement skin soft tissue muscle and fascia anterior compartment  right leg.   Transfer of the lateral compartment muscles to the anterior compartment to cover the anterior tibial vessels.  Repeat debridement 10/01/2024 with vascular status post I&D and excisional debridement 10/8: Excisional debridement right calf with excision of skin soft tissue muscle and fascia. Local tissue transfer for wound closure 35 x 10 cm. Application Kerecis micro graft 95 cm x 2 and 38 cm x 1. Application of cleanse choice wound VAC sponges x 1 and peel in place wound VAC sponges x 2 Doppler to verify arterial competence He still does not have sensation in his right foot despite revascularization.   Lumbar radiculopathy S/p recent L4-L5 decompression/fusion -9/10 Dr Beuford Continue morphine  and OxyContin  and oxycodone  Lyrica    Acute blood loss anemia- Ordered one unit of PRBC on 10/10. No evidence of active bleeding. F/u H&H closely.  AKI Resolved with IV hydration.     Acute urinary retention Foul-smelling urine H/o prostate cancer he had Foley catheter inserted on admission.  This was removed on 27 September.  Since then he required multiple In-N-Out catheterization.  With routine reminders he is able to urinate without having a Foley in place.  Continue Flomax .   Hyponatremia Mild.  Continue to monitor.   Hyperlipidemia Crestor  20 mg daily   COPD Bronchodilators as needed   Diverticulosis with constipation  KUB done on 10/04/2024 showed no bowel obstruction.   Anxiety/Major moderate depression Celexa  20 mg daily, Cymbalta  nightly, Ambien  nightly as needed   Pressure injury bilateral buttocks and right heel deep tissue pressure injury seen by wound care.   Severe malnutrition due to acute illness as evidenced by moderate muscle depletion mild fat depletion.  Dietary consulted continue Ensure mighty shake Juven twice daily multivitamin.  Disposition: Back to CIR  DVT prophylaxis: on IV Heparin  drip     Code Status: Full Code Family Communication:  None at the  bedside Status is: Inpatient Remains inpatient appropriate because: LE debridements  Subjective:  He was hypotensive overnight and received fluid bolus. He is complaining of RLE pain. We talked about transfusing a unit of blood.  Examination:  General exam: Appears to be in mild distress Respiratory system: Clear to auscultation. Respiratory effort normal. Cardiovascular system: S1 & S2 heard, RRR. No JVD, murmurs, rubs, gallops or clicks. No pedal edema. Gastrointestinal system: Abdomen is nondistended, soft and nontender. No organomegaly or masses felt. Normal bowel sounds heard. Central nervous system: Alert and oriented.  Extremities: RLE dressing and wound vac x 2 in place   Wound 10/05/24 0815 Pressure Injury Buttocks Right;Left Stage 1 -  Intact skin with non-blanchable redness of a localized area usually over a bony prominence. (Active)     Diet Orders (From admission, onward)     Start     Ordered   10/06/24 1535  Diet regular Room service appropriate? Yes with Assist; Fluid consistency: Thin  Diet effective now       Question Answer Comment  Room service appropriate? Yes with Assist   Fluid consistency: Thin      10/06/24 1534            Objective: Vitals:   10/08/24 0140 10/08/24 0508 10/08/24 0540 10/08/24 0733  BP: (!) 85/66 93/75  98/69  Pulse: 85   90  Resp: 13 16  12   Temp: 97.6 F (36.4 C)   97.6 F (36.4 C)  TempSrc: Oral   Oral  SpO2: 94%     Weight:   88.5 kg   Height:  Intake/Output Summary (Last 24 hours) at 10/08/2024 0937 Last data filed at 10/08/2024 9374 Gross per 24 hour  Intake 665.69 ml  Output 2245 ml  Net -1579.31 ml   Filed Weights   10/01/24 1038 10/07/24 0500 10/08/24 0540  Weight: 83.2 kg 85.7 kg 88.5 kg    Scheduled Meds:  sodium chloride    Intravenous Once   ascorbic acid   500 mg Oral Daily   aspirin  EC  81 mg Oral Daily   budesonide -glycopyrrolate -formoterol   2 puff Inhalation BID   citalopram   20 mg  Oral Daily   doxycycline  100 mg Oral Q12H   DULoxetine   30 mg Oral QHS   feeding supplement  237 mL Oral BID BM   multivitamin with minerals  1 tablet Oral Daily   nutrition supplement (JUVEN)  1 packet Oral BID BM   oxyCODONE   10 mg Oral Q12H   polyethylene glycol  17 g Oral Daily   pregabalin   150 mg Oral TID   rosuvastatin   20 mg Oral Daily   senna-docusate  1 tablet Oral QHS   tamsulosin   0.4 mg Oral QPC supper   thiamine  100 mg Oral Daily   zinc  sulfate (50mg  elemental zinc )  220 mg Oral Daily   Continuous Infusions:  sodium chloride  100 mL/hr at 10/08/24 0138   heparin       Nutritional status Signs/Symptoms: moderate muscle depletion, mild fat depletion, energy intake < or equal to 50% for > or equal to 5 days Interventions: Refer to RD note for recommendations Body mass index is 28.8 kg/m.  Data Reviewed:   CBC: Recent Labs  Lab 10/05/24 0354 10/06/24 0344 10/07/24 0313 10/07/24 2206 10/08/24 0353  WBC 18.6* 17.7* 14.6* 18.5* 14.3*  NEUTROABS  --   --  11.3*  --  10.2*  HGB 10.4* 9.3* 9.2* 8.0* 7.2*  HCT 32.1* 28.4* 28.3* 25.5* 22.5*  MCV 95.5 94.0 95.3 98.1 96.6  PLT 241 258 266 256 202   Basic Metabolic Panel: Recent Labs  Lab 10/02/24 0353 10/03/24 0300 10/07/24 2206 10/08/24 0353  NA 134* 134* 134* 132*  K 4.2 4.1 4.5 3.8  CL 102 99 96* 99  CO2 23 24 27 22   GLUCOSE 113* 99 122* 102*  BUN 16 27* 35* 28*  CREATININE 1.06 1.15 1.49* 1.11  CALCIUM  8.5* 8.7* 8.7* 7.7*  MG  --   --  2.2  --   PHOS  --   --  3.3  --    GFR: Estimated Creatinine Clearance: 68.1 mL/min (by C-G formula based on SCr of 1.11 mg/dL). Liver Function Tests: No results for input(s): AST, ALT, ALKPHOS, BILITOT, PROT, ALBUMIN  in the last 168 hours.  No results for input(s): LIPASE, AMYLASE in the last 168 hours. No results for input(s): AMMONIA in the last 168 hours. Coagulation Profile: No results for input(s): INR, PROTIME in the last 168  hours. Cardiac Enzymes: Recent Labs  Lab 10/07/24 2206  CKTOTAL 69   BNP (last 3 results) No results for input(s): PROBNP in the last 8760 hours. HbA1C: No results for input(s): HGBA1C in the last 72 hours. CBG: Recent Labs  Lab 10/01/24 1628  GLUCAP 131*   Lipid Profile: No results for input(s): CHOL, HDL, LDLCALC, TRIG, CHOLHDL, LDLDIRECT in the last 72 hours. Thyroid Function Tests: No results for input(s): TSH, T4TOTAL, FREET4, T3FREE, THYROIDAB in the last 72 hours. Anemia Panel: No results for input(s): VITAMINB12, FOLATE, FERRITIN, TIBC, IRON, RETICCTPCT in the last 72 hours. Sepsis  Labs: Recent Labs  Lab 10/07/24 2206 10/08/24 0045  LATICACIDVEN 3.5* 1.2    Recent Results (from the past 240 hours)  MRSA Next Gen by PCR, Nasal     Status: None   Collection Time: 10/05/24  7:36 PM   Specimen: Nasal Mucosa; Nasal Swab  Result Value Ref Range Status   MRSA by PCR Next Gen NOT DETECTED NOT DETECTED Final    Comment: (NOTE) The GeneXpert MRSA Assay (FDA approved for NASAL specimens only), is one component of a comprehensive MRSA colonization surveillance program. It is not intended to diagnose MRSA infection nor to guide or monitor treatment for MRSA infections. Test performance is not FDA approved in patients less than 47 years old. Performed at [facility], [address], [place], [place] [address]   Aerobic/Anaerobic Culture w Gram Stain (surgical/deep wound)     Status: None (Preliminary result)   Collection Time: 10/06/24 10:23 AM   Specimen: Soft Tissue, Other  Result Value Ref Range Status   Specimen Description TISSUE  Final   Special Requests FASCIA RIGHT LEG  Final   Gram Stain NO WBC SEEN NO ORGANISMS SEEN   Final   Culture   Final    NO GROWTH 2 DAYS Performed at [facility], [address], [place], [place] [address]    Report Status PENDING  Incomplete  Culture, blood (Routine X 2) w  Reflex to ID Panel     Status: None (Preliminary result)   Collection Time: 10/07/24 10:06 PM   Specimen: BLOOD  Result Value Ref Range Status   Specimen Description BLOOD SITE NOT SPECIFIED  Final   Special Requests   Final    BOTTLES DRAWN AEROBIC AND ANAEROBIC Blood Culture adequate volume   Culture   Final    NO GROWTH < 12 HOURS Performed at [facility], [address], [place], [place] [address]    Report Status PENDING  Incomplete  Culture, blood (Routine X 2) w Reflex to ID Panel     Status: None (Preliminary result)   Collection Time: 10/07/24 10:09 PM   Specimen: BLOOD  Result Value Ref Range Status   Specimen Description BLOOD SITE NOT SPECIFIED  Final   Special Requests   Final    BOTTLES DRAWN AEROBIC AND ANAEROBIC Blood Culture adequate volume   Culture   Final    NO GROWTH < 12 HOURS Performed at [facility], [address] [place], [place] [address]    Report Status PENDING  Incomplete         Radiology Studies: No results found.     LOS: 14 days   Time spent= 41 mins    Deliliah Room, MD [facility]  If 7PM-7AM, please contact night-coverage  10/08/2024, 9:37 AM

## 2024-10-08 NOTE — Progress Notes (Signed)
 PT Cancellation Note  Patient Details Name: David King MRN: [id number] DOB: [birth year]   Cancelled Treatment:    Reason Eval/Treat Not Completed: (P) Medical issues which prohibited therapy (PTA defer due to pt low BP (even after supine UE ROM) at rest and per RN, pt pending blood transfusion today; plan to reassess later in the day as schedule permits if BP more stable.)   Jourdan Maldonado M Lloyd Ayo 10/08/2024, 11:46 AM

## 2024-10-08 NOTE — Discharge Instructions (Addendum)
 Information on my medicine - ELIQUIS (apixaban)  This medication education was reviewed with me or my healthcare representative as part of my discharge preparation.    Why was Eliquis prescribed for you? Eliquis was prescribed to treat blood clots that may have been found in the veins of your legs (deep vein thrombosis) or in your lungs (pulmonary embolism) and to reduce the risk of them occurring again.  What do You need to know about Eliquis ? The dose is ONE 5 mg tablet taken TWICE daily.  Eliquis may be taken with or without food.   Try to take the dose about the same time in the morning and in the evening. If you have difficulty swallowing the tablet whole please discuss with your pharmacist how to take the medication safely.  Take Eliquis exactly as prescribed and DO NOT stop taking Eliquis without talking to the doctor who prescribed the medication.  Stopping may increase your risk of developing a new blood clot.  Refill your prescription before you run out.  After discharge, you should have regular check-up appointments with your healthcare provider that is prescribing your Eliquis.    What do you do if you miss a dose? If a dose of ELIQUIS is not taken at the scheduled time, take it as soon as possible on the same day and twice-daily administration should be resumed. The dose should not be doubled to make up for a missed dose.  Important Safety Information A possible side effect of Eliquis is bleeding. You should call your healthcare provider right away if you experience any of the following: Bleeding from an injury or your nose that does not stop. Unusual colored urine (red or dark brown) or unusual colored stools (red or black). Unusual bruising for unknown reasons. A serious fall or if you hit your head (even if there is no bleeding).  Some medicines may interact with Eliquis and might increase your risk of bleeding or clotting while on Eliquis. To help avoid this,  consult your healthcare provider or pharmacist prior to using any new prescription or non-prescription medications, including herbals, vitamins, non-steroidal anti-inflammatory drugs (NSAIDs) and supplements.  This website has more information on Eliquis (apixaban): http://www.eliquis.com/eliquis/home  ============================== Deep Vein Thrombosis    Deep vein thrombosis (DVT) is a condition in which a blood clot forms in a deep vein, such as a lower leg, thigh, or arm vein. A clot is blood that has thickened into a gel or solid. This condition is dangerous. It can lead to serious and even life-threatening complications if the clot travels to the lungs and causes a blockage (pulmonary embolism). It can also damage veins in the leg. This can result in leg pain, swelling, discoloration, and sores (post-thrombotic syndrome).  What are the causes? This condition may be caused by: A slowdown of blood flow. Damage to a vein. A condition that causes blood to clot more easily, such as an inherited clotting disorder.  What increases the risk? The following factors may make you more likely to develop this condition: Being overweight. Being older, especially over age 65. Sitting or lying down for more than four hours. Being in the hospital. Lack of physical activity (sedentary lifestyle). Pregnancy, being in childbirth, or having recently given birth. Taking medicines that contain estrogen, such as medicines to prevent pregnancy. Smoking. A history of any of the following: Blood clots or a blood clotting disease. Peripheral vascular disease. Inflammatory bowel disease. Cancer. Heart disease. Genetic conditions that affect how your blood  clots, such as Factor V Leiden mutation. Neurological diseases that affect your legs (leg paresis). A recent injury, such as a car accident. Major or lengthy surgery. A central line placed inside a large vein.  What are the signs or  symptoms? Symptoms of this condition include: Swelling, pain, or tenderness in an arm or leg. Warmth, redness, or discoloration in an arm or leg. If the clot is in your leg, symptoms may be more noticeable or worse when you stand or walk. Some people may not develop any symptoms.  How is this diagnosed? This condition is diagnosed with: A medical history and physical exam. Tests, such as: Blood tests. These are done to check how well your blood clots. Ultrasound. This is done to check for clots. Venogram. For this test, contrast dye is injected into a vein and X-rays are taken to check for any clots  How is this treated? Treatment for this condition depends on: The cause of your DVT. Your risk for bleeding or developing more clots. Any other medical conditions that you have. Treatment may include: Taking a blood thinner (anticoagulant). This type of medicine prevents clots from forming. It may be taken by mouth, injected under the skin, or injected through an IV (catheter). Injecting clot-dissolving medicines into the affected vein (catheter-directed thrombolysis). Having surgery. Surgery may be done to: Remove the clot. Place a filter in a large vein to catch blood clots before they reach the lungs. Some treatments may be continued for up to six months.  Follow these instructions at home: If you are taking blood thinners: Take the medicine exactly as told by your health care provider. Some blood thinners need to be taken at the same time every day. Do not skip a dose. Talk with your health care provider before you take any medicines that contain aspirin or NSAIDs. These medicines increase your risk for dangerous bleeding. Ask your health care provider about foods and drugs that could change the way the medicine works (may interact). Avoid those things if your health care provider tells you to do so. Blood thinners can cause easy bruising and may make it difficult to stop bleeding.  Because of this: Be very careful when using knives, scissors, or other sharp objects. Use an electric razor instead of a blade. Avoid activities that could cause injury or bruising, and follow instructions about how to prevent falls. Wear a medical alert bracelet or carry a card that lists what medicines you take.  General instructions Take over-the-counter and prescription medicines only as told by your health care provider. Return to your normal activities as told by your health care provider. Ask your health care provider what activities are safe for you. Wear compression stockings if recommended by your health care provider. Keep all follow-up visits as told by your health care provider. This is important.  How is this prevented? To lower your risk of developing this condition again: For 30 or more minutes every day, do an activity that: Involves moving your arms and legs. Increases your heart rate. When traveling for longer than four hours: Exercise your arms and legs every hour. Drink plenty of water. Avoid drinking alcohol. Avoid sitting or lying for a long time without moving your legs. If you have surgery or you are hospitalized, ask about ways to prevent blood clots. These may include taking frequent walks or using anticoagulants. Stay at a healthy weight. If you are a woman who is older than age 61, avoid unnecessary use of medicines  that contain estrogen, such as some birth control pills. Do not use any products that contain nicotine or tobacco, such as cigarettes and e-cigarettes. This is especially important if you take estrogen medicines. If you need help quitting, ask your health care provider.  Contact a health care provider if: You miss a dose of your blood thinner. Your menstrual period is heavier than usual. You have unusual bruising.  Get help right away if: You have: New or increased pain, swelling, or redness in an arm or leg. Numbness or tingling in an arm  or leg. Shortness of breath. Chest pain. A rapid or irregular heartbeat. A severe headache or confusion. A cut that will not stop bleeding. There is blood in your vomit, stool, or urine. You have a serious fall or accident, or you hit your head. You feel light-headed or dizzy. You cough up blood.  These symptoms may represent a serious problem that is an emergency. Do not wait to see if the symptoms will go away. Get medical help right away. Call your local emergency services (911 in the [place] Do not drive yourself to the hospital. Summary Deep vein thrombosis (DVT) is a condition in which a blood clot forms in a deep vein, such as a lower leg, thigh, or arm vein. Symptoms can include swelling, warmth, pain, and redness in your leg or arm. This condition may be treated with a blood thinner (anticoagulant medicine), medicine that is injected to dissolve blood clots,compression stockings, or surgery. If you are prescribed blood thinners, take them exactly as told. This information is not intended to replace advice given to you by your health care provider. Make sure you discuss any questions you have with your health care provider. Document Revised: 11/28/2017 Document Reviewed: 05/16/2017 Elsevier Patient Education  2020 [employer].

## 2024-10-08 NOTE — Progress Notes (Signed)
  Progress Note    10/08/2024 7:44 AM 2 Days Post-Op  Subjective:  right leg painful, didn't sleep because of pain. Unable to get pain medication overnight due to hypotension   Vitals:   10/08/24 0508 10/08/24 0733  BP: 93/75 98/69  Pulse:  90  Resp: 16 12  Temp:  (!) 97.5 F (36.4 C)  SpO2:     Physical Exam: Cardiac:  regular Lungs:  non labored Incisions:  Right leg with VAC to suction Extremities:no motor or sensation, doppler DP/PT signals Neurologic: alert and oriented  CBC    Component Value Date/Time   WBC 14.3 (H) 10/08/2024 0353   RBC 2.33 (L) 10/08/2024 0353   HGB 7.2 (L) 10/08/2024 0353   HCT 22.5 (L) 10/08/2024 0353   PLT 202 10/08/2024 0353   MCV 96.6 10/08/2024 0353   MCH 30.9 10/08/2024 0353   MCHC 32.0 10/08/2024 0353   RDW 16.8 (H) 10/08/2024 0353   LYMPHSABS 1.9 10/08/2024 0353   MONOABS 1.2 (H) 10/08/2024 0353   EOSABS 0.1 10/08/2024 0353   BASOSABS 0.1 10/08/2024 0353    BMET    Component Value Date/Time   NA 132 (L) 10/08/2024 0353   K 3.8 10/08/2024 0353   CL 99 10/08/2024 0353   CO2 22 10/08/2024 0353   GLUCOSE 102 (H) 10/08/2024 0353   BUN 28 (H) 10/08/2024 0353   CREATININE 1.11 10/08/2024 0353   CREATININE 1.06 10/10/2020 0915   CALCIUM  7.7 (L) 10/08/2024 0353   GFRNONAA >60 10/08/2024 0353   GFRNONAA 73 10/10/2020 0915   GFRAA 84 10/10/2020 0915    INR    Component Value Date/Time   INR 1.2 08/22/2022 0833     Intake/Output Summary (Last 24 hours) at 10/08/2024 0744 Last data filed at 10/08/2024 9374 Gross per 24 hour  Intake 665.69 ml  Output 2245 ml  Net -1579.31 ml     Assessment/Plan:  68 y.o. male is s/p right axillary bifemoral bypass graft for right leg ischemia and subsequent muscle debridement  2 Days Post-Op   RLE well perfused and warm with DP/PT signals Wound VAC to suction Pain control as needed Some hypotension this morning. Receiving fluids Wound Management/ [facility] management per Dr. Harden.  Outpatient follow up arranged Continue to work with therapy teams  Teretha Damme, [facility] [phone] 10/08/2024 7:44 AM

## 2024-10-08 NOTE — Progress Notes (Signed)
 PHARMACY - ANTICOAGULATION CONSULT NOTE  Pharmacy Consult for heparin  Indication: arterial occlusion  Labs: Recent Labs    10/06/24 0344 10/06/24 2048 10/07/24 0313 10/07/24 1532 10/07/24 2206 10/08/24 0045  HGB 9.3*  --  9.2*  --  8.0*  --   HCT 28.4*  --  28.3*  --  25.5*  --   PLT 258  --  266  --  256  --   HEPARINUNFRC 0.45   < > 0.56 0.55  --  0.51  CREATININE  --   --   --   --  1.49*  --   CKTOTAL  --   --   --   --  69  --    < > = values in this interval not displayed.   Assessment: 68yo male remains slightly supratherapeutic on heparin  after rate change; no infusion issues or overt signs of bleeding per RN though pt has been hypotensive and is being typed and crossed.  Goal of Therapy:  Heparin  level 0.3-0.5 units/ml   Plan:  Decrease heparin  infusion by 1 unit/kg/hr to 1500 units/hr. Check level in 8 hours.   David King, PharmD, BCPS 10/08/2024 1:13 AM

## 2024-10-08 NOTE — Progress Notes (Signed)
 Inpatient Rehab Admissions Coordinator:    Pt. Appears stable for potential return to CIR. I will send case to insurance and pursue admit.   Leita Kleine, MS, CCC-SLP Rehab Admissions Coordinator  [phone] (celll) [phone] (office)

## 2024-10-08 NOTE — Progress Notes (Signed)
 On call MD notified of critical Lactic acid of 3.5, per MD obtain repeat Lactic acid once 1000 ml NS IV bolus completed.

## 2024-10-08 NOTE — Progress Notes (Signed)
   10/07/24 2133  Vitals  BP (!) 78/52  BP Location Right Arm  BP Method Automatic  Patient Position (if appropriate) Lying  MEWS COLOR  MEWS Score Color Yellow  MEWS Score  MEWS Temp 0  MEWS Systolic 2  MEWS Pulse 1  MEWS RR 0  MEWS LOC 0  MEWS Score 3   On call MD notified of BP 78/52 on (R) arm, rechecked on (L) arm with a reading of 72/60.  Patient is requesting pain medication for (R) leg pain.  Per MD, administer 1000 ml IV bolus in addition to obtaining labs (Lactic acid, ABGs, CBC, Phosporus, Mg+, BMP w/GFR), once systolic BP is in the 90s can administer pain medication.

## 2024-10-08 NOTE — Progress Notes (Signed)
 Physical Medicine & Rehabilitation Consult Service  Pt discussed with rehab admissions coordinator. Chart has been reviewed. This is a 68 yo male initially admitted to inpatient rehab after a complicated lumbar decompression and fusion revision surgery.  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  Patient had to get the back to the OR again for thrombectomy and further fasciotomies and debridements.  Patient had issues with anemia and hypotension.  Patient has been able to work with therapies on a limited basis and was a mod to max assist for transfers and max assist for feet using a rolling walker.  For ADLs he was max assist for basic tasks.   Home: Home Living Family/patient expects to be discharged to:: Private residence Living Arrangements: Spouse/significant other Available Help at Discharge: Family, Available 24 hours/day Type of Home: House Home Access: Stairs to enter [employer] of Steps: 4 Entrance Stairs-Rails: Can reach both Home Layout: Two level Alternate Level Stairs-Number of Steps: pt does not access second level of home Bathroom Shower/Tub: Health visitor: Handicapped height Bathroom Accessibility: Yes Home Equipment: Agricultural consultant (2 wheels), Shower seat, Grab bars - tub/shower, Medical laboratory scientific officer - single point, Adaptive equipment, Hand held shower head, Other (comment) Adaptive Equipment: Reacher Additional Comments: bench in shower HH showerhead, grab bars in shower  Lives With: Spouse  Functional History: Prior Function Prior Level of Function : Independent/Modified Independent,  Driving Mobility Comments: mod I with hurry cane at baseline - was walking ~30ft wtih RW and PT at AIR ADLs Comments: at baseline - needing min-mod A for ADLs at AIR Functional Status:  Mobility: Bed Mobility Overal bed mobility: Needs Assistance Bed Mobility: Rolling, Sidelying to Sit Rolling: Supervision Sidelying to sit: Supervision Sit to sidelying: Supervision General bed mobility comments: S for safety, good recall of log roll technique Transfers Overall transfer level: Needs assistance Equipment used: Rolling walker (2 wheels) Transfers: Sit to/from Stand, Bed to chair/wheelchair/BSC Sit to Stand: Mod assist, +2 physical assistance, +2 safety/equipment, From elevated surface Bed to/from chair/wheelchair/BSC transfer type:: Step pivot Stand pivot transfers: Mod assist, +2 physical assistance, +2 safety/equipment Squat pivot transfers: Mod assist Step pivot transfers: Max assist, +2 physical assistance, +2 safety/equipment, From elevated surface Transfer via Lift Equipment: Stedy General transfer comment: Pt with good muscle to power up with min-mod +2 assist from air bed and chair surfaces, during step pivot pt with knee buckling, requiring max+2 assist to complete step pivot and bil knees blocked at times. Ambulation/Gait Ambulation/Gait assistance: Max assist, +2 physical assistance Gait Distance (Feet): 4 Feet Assistive device: Rolling walker (2 wheels) Gait Pattern/deviations: Shuffle, Decreased dorsiflexion - right, Knee flexed in stance - right, Knee flexed in stance - left, Knees buckling, Step-to pattern, Narrow base of support General Gait Details: Decreased bil knee extension in stance phase, pt needs AA for RLE while abducting for pivotal steps and R and at times L knee blocked due to pt c/f buckling/weakness, also assist for RW management and steadying assist. Stepping toward chair on his R side and posterior steps a couple feet. Stairs:  (not safe to attempt today 2/2  low BP/weakness)    ADL: ADL Overall ADL's : Needs assistance/impaired Eating/Feeding: Sitting, Set up Grooming: Set up, Sitting  Upper Body Bathing: Set up, Sitting Lower Body Bathing: Maximal assistance, Bed level Upper Body Dressing : Minimal assistance Upper Body Dressing Details (indicate cue type and reason): donning back brace Lower Body Dressing: Maximal assistance, Bed level, +2 for safety/equipment Lower Body Dressing Details (indicate cue type and reason): To don brief with rolling Toilet Transfer: Maximal assistance, +2 for physical assistance, +2 for safety/equipment, Rolling walker (2 wheels), Stand-pivot Toilet Transfer Details (indicate cue type and reason): Stand, step pivot Toileting- Clothing Manipulation and Hygiene: Moderate assistance, +2 for safety/equipment Functional mobility during ADLs: Maximal assistance, +2 for physical assistance, +2 for safety/equipment, Rolling walker (2 wheels) General ADL Comments: Pt limited d/t soft BP  Cognition: Cognition Orientation Level: Oriented X4 Cognition Arousal: Lethargic Behavior During Therapy: Flat affect   Assessment: 68 year old male with history of complicated lumbar revision surgery 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   Plan:  This patient would benefit from acute inpatient rehab to address functional mobility, self-care, wound care, pain management. Additionally, the patient requires daily MD oversight of the active medical issues noted above. Projected goals would be supervision to min assist with self-care mobility with an ELOS of 13-20 days.     Rehab Admissions Coordinator to follow up.    Arthea IVAR Gunther, MD, [facility] Physical Medicine & Rehabilitation Medical Director Rehabilitation Services 10/08/2024

## 2024-10-08 NOTE — Progress Notes (Signed)
   10/08/24 0140  Vitals  Temp 97.6 F (36.4 C)  Temp Source Oral  BP (!) 85/66  MAP (mmHg) 74  BP Location Right Arm  BP Method Automatic  Patient Position (if appropriate) Lying  Pulse Rate 85  Pulse Rate Source Monitor  ECG Heart Rate 85  Resp 13  MEWS COLOR  MEWS Score Color Yellow  Oxygen  Therapy  SpO2 94 %  MEWS Score  MEWS Temp 0  MEWS Systolic 1  MEWS Pulse 0  MEWS RR 1  MEWS LOC 0  MEWS Score 2   On call MD notified of continued pain not relieved with scheduled Oxycontin  10mg . BP has dropped to 85/66.  Per MD administer PRN Oxycodone  10mg  for pain and administer 500ml NS IV bolus.

## 2024-10-08 NOTE — Progress Notes (Signed)
 Notified patient hypotensive, BP low as 72/60, verified both arms. Other VS stable, tachycardia improving. On exam he has wound vac on the RLE, appears soft, no signs of infection, DP is dopplerable. Started bolus of NS, had gotten about 200 cc at time lactate was drawn which is 3.5. other labs with worsening AKI, Cr up to 1.49. Hb 9.2 -> 8. Unclear if lactate from hypovolemia or possibly related to his surgical procedures and tissue injury in his leg.   A/P:   Hypotension, likely hypovolemic v distributive  Lactic acidosis, ? Related to hypovolemia v tissue injury  AKI stage I, worsening  Acute blood loss anemia  -- Complete bolus, repeat lactate. Rebolus if lactate remains elevated  -- Check CK  -- Keep antibiotics same for now with vancomycin , levofloxacin. Check Blood cultures.  -- Repeat type and screen, trend CBC   David Dawson, MD  [facility]

## 2024-10-09 DIAGNOSIS — I739 Peripheral vascular disease, unspecified: Secondary | ICD-10-CM | POA: Diagnosis not present

## 2024-10-09 LAB — TYPE AND SCREEN
ABO/RH(D): O POS
Antibody Screen: NEGATIVE
Unit division: 0

## 2024-10-09 LAB — CBC
HCT: 23.6 % — ABNORMAL LOW (ref 39.0–52.0)
Hemoglobin: 7.7 g/dL — ABNORMAL LOW (ref 13.0–17.0)
MCH: 30.2 pg (ref 26.0–34.0)
MCHC: 32.6 g/dL (ref 30.0–36.0)
MCV: 92.5 fL (ref 80.0–100.0)
Platelets: 161 K/uL (ref 150–400)
RBC: 2.55 MIL/uL — ABNORMAL LOW (ref 4.22–5.81)
RDW: 20.2 % — ABNORMAL HIGH (ref 11.5–15.5)
WBC: 12 K/uL — ABNORMAL HIGH (ref 4.0–10.5)
nRBC: 0 % (ref 0.0–0.2)

## 2024-10-09 LAB — BPAM RBC
Blood Product Expiration Date: 202511052359
ISSUE DATE / TIME: 202510101418
Unit Type and Rh: 5100

## 2024-10-09 LAB — HEPARIN LEVEL (UNFRACTIONATED)
Heparin Unfractionated: 0.79 [IU]/mL — ABNORMAL HIGH (ref 0.30–0.70)
Heparin Unfractionated: 0.25 [IU]/mL — ABNORMAL LOW (ref 0.30–0.70)

## 2024-10-09 MED ORDER — IBUPROFEN 200 MG PO TABS: 400.0000 mg | ORAL_TABLET | ORAL | Status: DC | PRN

## 2024-10-09 NOTE — Progress Notes (Addendum)
 PROGRESS NOTE    David King  FMW:[id number] DOB: June 07, 1956 DOA: 09/24/2024 PCP: [name], MD   Brief Narrative:    68 y.o. male with PMH significant for HTN, HLD, retinal detachment, prostate cancer, PAD s/p b/l common [place] artery stenting, chronic back pain, lumbar radiculopathy.Because of progressive radiculopathy pain, patient was following up with orthopedics as an outpatient.  He had previous L4-L5 disc decompression.  Recent MRI showed severe stenosis of the left L4-L5 due to large disc herniation.   -- 09/08/2024, patient underwent revision L4-L5 decompression, lumbar body fusion by Dr. Beuford.  During the procedure, patient had an anaphylactic reaction to an unknown agent and developed hives with some hypotension. He required short-term intubation and transferred to ICU, post recovery he was noted to have some improvement in his left leg pain, postop site looked okay but he developed some right lower extremity weakness.  Neurology was consulted.  MRI brain was unremarkable.  EMG showed nonspecific changes.  -- 9/17, discharged to CIR. 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 -- 9/26, patient was readmitted to acute care with a plan of revascularization.  Patient underwent right axillary femoral bypass graft, bilateral common femoral, superficial femoral and profundofemoral thrombectomy, right leg anterior compartment fasciotomy due to compartment syndrome, right femoral endarterectomy. Soon after coming out of the OR, he was found to have decrease in his right lower extremity pulses again. Seen by vascular surgery, taken to the OR again.  Underwent right leg angiogram, found to have thrombus in the popliteal artery, underwent successful thrombectomy. Postop, admitted  to [facility] -- 9/29, underwent right calf lateral fasciotomy washout, skin debridement and wound VAC placement.  -09/28/2024 right calf lateral fasciotomy without any skin debridement and wound VAC placement - 09/29/2024 excisional debridement skin and soft tissue muscle and fascia anterior compartment of the right leg transfer of the lateral compartment muscles to the anterior compartment to cover the anterior tibial vessels.  Dr. Harden 10/01/2024: repeat debridement with vascular I&D and excisional debridement 10/8:Excisional debridement right calf with excision of skin soft tissue muscle and fascia. Local tissue transfer for wound closure 35 x 10 cm. Application Kerecis micro graft 95 cm x 2 and 38 cm x 1. 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  But right foot with no motor or sensory function.   09/29/2024 Dr. Harden S/P  Excisional debridement skin soft tissue muscle and fascia anterior compartment  right leg.   Transfer of the lateral compartment muscles to the anterior compartment to cover the anterior tibial vessels.  Repeat debridement 10/01/2024 with vascular status post I&D and excisional debridement 10/8: Excisional debridement right calf with excision of skin soft tissue muscle and fascia. Local tissue transfer for wound closure 35 x 10 cm. Application Kerecis micro graft 95 cm x 2 and 38 cm x 1. Application of cleanse choice wound VAC sponges x 1 and peel in place wound VAC sponges x 2 Doppler to verify arterial competence He still does not have sensation in his right foot despite revascularization.   Lumbar radiculopathy S/p recent L4-L5 decompression/fusion -9/10 Dr Beuford Continue morphine  and OxyContin  and oxycodone  Lyrica    Acute blood loss anemia- Received one unit of PRBC on 10/10. Hb today is 7.7 up from 7.2 No evidence of active bleeding. F/u H&H closely.  AKI Resolved with IV hydration.     Acute urinary retention Foul-smelling urine H/o prostate cancer he had Foley catheter inserted on admission.  This was removed on 27 September.  Since then he required multiple In-N-Out catheterization.  With routine reminders he is able to urinate without having a Foley in place.  Continue Flomax .   Hyponatremia Mild.  Continue to monitor.   Hyperlipidemia Crestor  20 mg daily   COPD Bronchodilators as needed   Diverticulosis with constipation  KUB done on 10/04/2024 showed no bowel obstruction.   Anxiety/Major moderate depression Celexa  20 mg daily, Cymbalta  nightly, Ambien  nightly as needed   Pressure injury bilateral buttocks and right heel deep tissue pressure injury seen by wound care.   Severe malnutrition due to acute illness as evidenced by moderate muscle depletion mild fat depletion.  Dietary consulted continue Ensure mighty shake Juven twice daily multivitamin.  Disposition: Back to CIR  DVT prophylaxis: on IV Heparin  drip     Code Status: Full Code Family  Communication:  None at the bedside Status is: Inpatient Remains inpatient appropriate because: LE debridements  Subjective:  No acute issues overnight. Pain in the RLE has improved. We spoke about his Hb this morning and about pending insurance auth before he can go back to CIR.  Examination:  General exam: NAD, nasal oxygen  cannula in place Respiratory system: Clear to auscultation. Respiratory effort normal. Cardiovascular system: S1 & S2 heard, RRR. No JVD, murmurs, rubs, gallops or clicks. No pedal edema. Gastrointestinal system: Abdomen is nondistended, soft and nontender. No organomegaly or masses felt. Normal bowel sounds heard. Central nervous system: Alert and oriented.  Extremities: RLE dressing and wound vac x 2 in place   Wound 10/05/24 0815 Pressure Injury Buttocks Right;Left Stage 1 -  Intact skin with non-blanchable redness of a localized area usually over a bony prominence. (Active)     Diet Orders (From admission, onward)     Start     Ordered   10/06/24 1535  Diet regular Room service appropriate? Yes with Assist; Fluid consistency: Thin  Diet effective now       Question Answer Comment  Room service appropriate? Yes with Assist   Fluid consistency: Thin      10/06/24 1534            Objective: Vitals:   10/08/24 2055 10/09/24 0142 10/09/24 0603 10/09/24 0838  BP:  94/65 93/65 (!) 98/59  Pulse:  90 93 89  Resp:  16 12 18   Temp:  98.2 F (36.8 C) 98.1 F (36.7 C) 98.2 F (36.8 C)  TempSrc:  Oral Oral Oral  SpO2: 97% 97% 97% 100%  Weight:   89.8 kg   Height:        Intake/Output Summary (Last 24 hours) at 10/09/2024 1018 Last data filed at 10/09/2024 9381 Gross per 24 hour  Intake 1362.99 ml  Output 1675 ml  Net -312.01 ml   Filed Weights   10/07/24 0500 10/08/24 0540 10/09/24 0603  Weight: 85.7 kg 88.5 kg 89.8 kg    Scheduled Meds:  ascorbic acid   500 mg Oral Daily   aspirin  EC  81 mg Oral Daily    budesonide -glycopyrrolate -formoterol   2 puff Inhalation BID   citalopram   20 mg Oral Daily   doxycycline  100 mg Oral Q12H   DULoxetine   30 mg Oral QHS   feeding supplement  237 mL Oral BID BM   multivitamin with minerals  1 tablet Oral Daily   nutrition supplement (JUVEN)  1 packet Oral BID BM   oxyCODONE   10 mg Oral Q12H   polyethylene glycol  17 g Oral Daily   pregabalin   150 mg Oral TID   rosuvastatin   20 mg Oral Daily   senna-docusate  1 tablet Oral QHS   tamsulosin   0.4 mg Oral QPC supper   thiamine  100 mg Oral Daily   zinc  sulfate (50mg  elemental zinc )  220 mg Oral Daily   Continuous Infusions:  heparin  1,550 Units/hr (10/09/24 0457)    Nutritional status Signs/Symptoms: moderate muscle depletion, mild fat depletion, energy intake < or equal to 50% for > or equal to 5 days Interventions: Refer to RD note for recommendations Body mass index is 29.24 kg/m.  Data Reviewed:   CBC: Recent Labs  Lab 10/06/24 0344 10/07/24 0313 10/07/24 2206 10/08/24 0353 10/09/24 0410  WBC 17.7* 14.6* 18.5* 14.3* 12.0*  NEUTROABS  --  11.3*  --  10.2*  --   HGB 9.3* 9.2* 8.0* 7.2* 7.7*  HCT 28.4* 28.3* 25.5* 22.5* 23.6*  MCV 94.0 95.3 98.1 96.6 92.5  PLT 258 266 256 202 161   Basic Metabolic Panel: Recent Labs  Lab 10/03/24 0300 10/07/24 2206 10/08/24 0353  NA 134* 134* 132*  K 4.1 4.5 3.8  CL 99 96* 99  CO2 24 27 22   GLUCOSE 99 122* 102*  BUN 27* 35* 28*  CREATININE 1.15 1.49* 1.11  CALCIUM  8.7* 8.7* 7.7*  MG  --  2.2  --   PHOS  --  3.3  --    GFR: Estimated Creatinine Clearance: 68.6 mL/min (by C-G formula based on SCr of 1.11 mg/dL). Liver Function Tests: No results for input(s): AST, ALT, ALKPHOS, BILITOT, PROT, ALBUMIN  in the last 168 hours.  No results for input(s): LIPASE, AMYLASE in the last 168 hours. No results for input(s): AMMONIA in the last 168 hours. Coagulation Profile: No results for input(s): INR, PROTIME in the last 168  hours. Cardiac Enzymes: Recent Labs  Lab 10/07/24 2206  CKTOTAL 69   BNP (last 3 results) No results for input(s): PROBNP in the last 8760 hours. HbA1C: No results for input(s): HGBA1C in the last 72 hours. CBG: No results for input(s): GLUCAP in the last 168 hours.  Lipid Profile: No results for input(s): CHOL, HDL, LDLCALC, TRIG, CHOLHDL, LDLDIRECT in the last 72 hours. Thyroid Function Tests: No results for input(s): TSH, T4TOTAL, FREET4, T3FREE, THYROIDAB in the last 72 hours. Anemia Panel: No results for input(s): VITAMINB12, FOLATE, FERRITIN, TIBC, IRON, RETICCTPCT in the last 72 hours. Sepsis Labs: Recent Labs  Lab 10/07/24 2206 10/08/24 0045  LATICACIDVEN 3.5* 1.2    Recent Results (from the past 240 hours)  MRSA Next Gen by PCR, Nasal     Status: None   Collection Time: 10/05/24  7:36 PM   Specimen: Nasal Mucosa; Nasal Swab  Result Value Ref Range Status   MRSA by PCR Next Gen NOT DETECTED NOT DETECTED Final    Comment: (NOTE) The GeneXpert MRSA Assay (FDA approved for NASAL specimens only), is one component of a comprehensive MRSA colonization surveillance program. It is not intended to diagnose MRSA infection nor to guide or monitor treatment for MRSA infections. Test performance is not FDA approved in patients less than 72 years old. Performed at [facility], [address], [place], [place] [address]   Aerobic/Anaerobic Culture w Gram Stain (surgical/deep wound)     Status: None (Preliminary result)   Collection Time: 10/06/24 10:23 AM   Specimen: Soft Tissue, Other  Result Value Ref Range Status   Specimen Description TISSUE  Final   Special Requests FASCIA RIGHT LEG  Final   Gram Stain NO WBC SEEN NO ORGANISMS SEEN   Final   Culture   Final    NO GROWTH 3 DAYS NO ANAEROBES ISOLATED; CULTURE IN PROGRESS FOR 5 DAYS Performed at [facility], [address] [place], [place] [address]    Report  Status PENDING  Incomplete  Culture, blood (Routine X 2) w Reflex to ID Panel     Status: None (Preliminary result)   Collection Time: 10/07/24 10:06 PM   Specimen: BLOOD  Result Value Ref Range Status   Specimen Description BLOOD SITE NOT SPECIFIED  Final   Special Requests   Final    BOTTLES DRAWN AEROBIC AND ANAEROBIC Blood Culture adequate volume   Culture   Final    NO GROWTH 2 DAYS Performed at [facility], [address] [place], [place] [address]    Report Status PENDING  Incomplete  Culture, blood (Routine X 2) w Reflex to ID Panel     Status: None (Preliminary result)   Collection Time: 10/07/24 10:09 PM   Specimen: BLOOD  Result Value Ref Range Status   Specimen Description BLOOD SITE NOT SPECIFIED  Final   Special Requests   Final    BOTTLES DRAWN AEROBIC AND ANAEROBIC Blood Culture adequate volume   Culture   Final    NO GROWTH 2 DAYS Performed at [facility], [address], [place], [place] [address]    Report Status PENDING  Incomplete         Radiology Studies: DG Lumbar Spine 2-3 Views Result Date: 10/08/2024 CLINICAL DATA:  Postoperative check EXAM: LUMBAR SPINE - 2-3 VIEW COMPARISON:  Lumbar localization image [id number]. MRI lumbar spine 09/13/2024 FINDINGS: Five lumbar type vertebral bodies. Postoperative changes with posterior rod and screw fixation at L4-5, intervertebral prosthesis at L4-5, and intervertebral prosthesis with anterior screw fixation at L5-S1. aortoiliac vascular stents. Mild lumbar scoliosis convex towards the left. No anterior subluxation. Diffuse degenerative change throughout the lumbar spine with narrowed interspaces and endplate osteophyte formation. No vertebral compression deformities. Visualized sacrum appears intact. IMPRESSION: Degenerative and postoperative changes in the lumbar spine as described. No acute displaced fractures are identified. Electronically Signed   By: Elsie Gravely M.D.   On: 10/08/2024 20:19        LOS: 15 days   Time spent= 41 mins    Deliliah Room, MD [facility]  If 7PM-7AM, please contact night-coverage  10/09/2024,  10:18 AM

## 2024-10-09 NOTE — Progress Notes (Signed)
 PHARMACY - ANTICOAGULATION CONSULT NOTE  Pharmacy Consult for Heparin  Indication:  arterial insufficiency / occlusion  Allergies  Allergen Reactions   Ancef  [Cefazolin ] Anaphylaxis    Patient Measurements: Height: 5' 9 (175.3 cm) Weight: 89.8 kg (198 lb) IBW/kg (Calculated) : 70.7 HEPARIN  DW (KG): 83.2 Heparin  dosing weight = 83 kg  Vital Signs: Temp: 98.2 F (36.8 C) (10/11 0838) Temp Source: Oral (10/11 0838) BP: 98/59 (10/11 0838) Pulse Rate: 89 (10/11 0838)  Labs: Recent Labs    10/07/24 2206 10/08/24 0045 10/08/24 0353 10/08/24 0947 10/09/24 0410  HGB 8.0*  --  7.2*  --  7.7*  HCT 25.5*  --  22.5*  --  23.6*  PLT 256  --  202  --  161  HEPARINUNFRC  --  0.51  --  0.31 0.79*  CREATININE 1.49*  --  1.11  --   --   CKTOTAL 69  --   --   --   --     Estimated Creatinine Clearance: 68.6 mL/min (by C-G formula based on SCr of 1.11 mg/dL).  Assessment: 69 YOM admitted w/ severe vascular insufficiency and compartment syndrome s/p right axillary bifemoral bypass c/b right popliteal thrombus now s/p thrombectomy and bypass. No PTA AC noted through a search of his EMR. Pharmacy consulted to start heparin  therapy.   Patient is s/p R calf lateral fasciotomy washout, debridement of skin and soft tissue on 9/29 and I&D on 10/1 and 10/8.   Per ortho, switch back to heparin  in case Hgb drop is not only due to IVF. Apixaban not given yet. Resume heparin .  Heparin  level supratherapeutic at 0.79 on 1550 units/hr. No signs of bleeding per RN.  Goal of Therapy:  Heparin  level 0.3-0.5 units/ml Monitor platelets by anticoagulation protocol: Yes    Plan: Decrease heparin  to 1350 units/hr Monitor daily heparin  level, CBC, signs/symptoms of bleeding  F/u switch to apixaban    Thank you for allowing pharmacy to be a part of this patient's care.  Dionicia Canavan, PharmD, RPh PGY1 Acute Care Pharmacy Resident [facility]  10/09/2024 10:57 AM   Please check AMION  for all [facility] Pharmacy phone numbers After 10:00 PM, call Main Pharmacy [phone]

## 2024-10-09 NOTE — Progress Notes (Signed)
 PHARMACY - ANTICOAGULATION CONSULT NOTE  Pharmacy Consult for Heparin  Indication:  arterial insufficiency / occlusion  Allergies  Allergen Reactions   Ancef  [Cefazolin ] Anaphylaxis    Patient Measurements: Height: 5' 9 (175.3 cm) Weight: 89.8 kg (198 lb) IBW/kg (Calculated) : 70.7 HEPARIN  DW (KG): 83.2 Heparin  dosing weight = 83 kg  Vital Signs: Temp: 98.1 F (36.7 C) (10/11 1224) Temp Source: Oral (10/11 1224) BP: 93/75 (10/11 1224) Pulse Rate: 93 (10/11 1224)  Labs: Recent Labs    10/07/24 2206 10/08/24 0045 10/08/24 0353 10/08/24 0947 10/09/24 0410 10/09/24 1734  HGB 8.0*  --  7.2*  --  7.7*  --   HCT 25.5*  --  22.5*  --  23.6*  --   PLT 256  --  202  --  161  --   HEPARINUNFRC  --    < >  --  0.31 0.79* 0.25*  CREATININE 1.49*  --  1.11  --   --   --   CKTOTAL 69  --   --   --   --   --    < > = values in this interval not displayed.    Estimated Creatinine Clearance: 68.6 mL/min (by C-G formula based on SCr of 1.11 mg/dL).  Assessment: 34 YOM admitted w/ severe vascular insufficiency and compartment syndrome s/p right axillary bifemoral bypass c/b right popliteal thrombus now s/p thrombectomy and bypass. No PTA AC noted through a search of his EMR. Pharmacy consulted to start heparin  therapy.   Patient is s/p R calf lateral fasciotomy washout, debridement of skin and soft tissue on 9/29 and I&D on 10/1 and 10/8.   Per ortho, switch back to heparin  in case Hgb drop is not only due to IVF. Apixaban not given yet. Resume heparin .  Heparin  level now down to slightly subtherapeutic (0.25) on 1350 units/hr. No signs of bleeding per RN.  Goal of Therapy:  Heparin  level 0.3-0.5 units/ml Monitor platelets by anticoagulation protocol: Yes    Plan: Increase heparin  to 1450 units/hr Will f/u 8 hr heparin  level  Thank you for allowing pharmacy to be a part of this patient's care.  Vito Ralph, PharmD, BCPS Please see amion for complete clinical pharmacist  phone list 10/09/2024 7:09 PM

## 2024-10-10 DIAGNOSIS — I739 Peripheral vascular disease, unspecified: Secondary | ICD-10-CM | POA: Diagnosis not present

## 2024-10-10 LAB — HEPARIN LEVEL (UNFRACTIONATED)
Heparin Unfractionated: >1.1 [IU]/mL — ABNORMAL HIGH (ref 0.30–0.70)
Heparin Unfractionated: <0.1 [IU]/mL — ABNORMAL LOW (ref 0.30–0.70)
Heparin Unfractionated: 0.13 [IU]/mL — ABNORMAL LOW (ref 0.30–0.70)

## 2024-10-10 LAB — CBC
HCT: 24.1 % — ABNORMAL LOW (ref 39.0–52.0)
Hemoglobin: 7.7 g/dL — ABNORMAL LOW (ref 13.0–17.0)
MCH: 29.5 pg (ref 26.0–34.0)
MCHC: 32 g/dL (ref 30.0–36.0)
MCV: 92.3 fL (ref 80.0–100.0)
Platelets: 162 K/uL (ref 150–400)
RBC: 2.61 MIL/uL — ABNORMAL LOW (ref 4.22–5.81)
RDW: 19.9 % — ABNORMAL HIGH (ref 11.5–15.5)
WBC: 12 K/uL — ABNORMAL HIGH (ref 4.0–10.5)
nRBC: 0 % (ref 0.0–0.2)

## 2024-10-10 MED ORDER — HEPARIN (PORCINE) 25000 UT/250ML-% IV SOLN
1500.0000 [IU]/h | INTRAVENOUS | Status: DC
  Filled 2024-10-10 (×2): qty 250

## 2024-10-10 NOTE — Progress Notes (Signed)
 PHARMACY - ANTICOAGULATION CONSULT NOTE  Pharmacy Consult for Heparin  Indication:  arterial insufficiency / occlusion  Allergies  Allergen Reactions   Ancef  [Cefazolin ] Anaphylaxis    Patient Measurements: Height: 5' 9 (175.3 cm) Weight: 85.3 kg (188 lb) IBW/kg (Calculated) : 70.7 HEPARIN  DW (KG): 85.3 Heparin  dosing weight = 83 kg  Vital Signs: Temp: 98.3 F (36.8 C) (10/12 2019) Temp Source: Oral (10/12 2019) BP: 126/103 (10/12 2019) Pulse Rate: 99 (10/12 2019)  Labs: Recent Labs    10/07/24 2206 10/08/24 0045 10/08/24 0353 10/08/24 0947 10/09/24 0410 10/09/24 1734 10/10/24 0300 10/10/24 1208 10/10/24 1939  HGB 8.0*  --  7.2*  --  7.7*  --  7.7*  --   --   HCT 25.5*  --  22.5*  --  23.6*  --  24.1*  --   --   PLT 256  --  202  --  161  --  162  --   --   HEPARINUNFRC  --    < >  --    < > 0.79*   < > >1.10* <0.10* 0.13*  CREATININE 1.49*  --  1.11  --   --   --   --   --   --   CKTOTAL 69  --   --   --   --   --   --   --   --    < > = values in this interval not displayed.    Estimated Creatinine Clearance: 67 mL/min (by C-G formula based on SCr of 1.11 mg/dL).  Assessment: 31 YOM admitted w/ severe vascular insufficiency and compartment syndrome s/p right axillary bifemoral bypass c/b right popliteal thrombus now s/p thrombectomy and bypass. No PTA AC noted through a search of his EMR. Pharmacy consulted to start heparin  therapy.   Patient is s/p R calf lateral fasciotomy washout, debridement of skin and soft tissue on 9/29 and I&D on 10/1 and 10/8. Per ortho, switch back to heparin  in case Hgb drop is not only due to IVF.   AM: Heparin  level undetectable on 1200 units/hr. Hgb stable at 7.7, Plts stable and wnl. Per RN, labs were drawn appropriately from opposite arm of infusion. No interruptions to infusion and no signs of bleeding.  PM: HL 0.13 drawn ~5hr after rate increased to 1350 units/hr. No issues with the infusion or bleeding reported.  Goal of  Therapy:  Heparin  level 0.3-0.5 units/ml Monitor platelets by anticoagulation protocol: Yes    Plan: Increase heparin  to 1500 units/hr Recheck 8 hr heparin  level Monitor CBC, signs of bleeding F/u plan to transition to apixaban  Thank you for allowing pharmacy to be a part of this patient's care.  Rocky Slade, PharmD, BCPS 10/10/2024 8:24 PM   Please check AMION for all [facility] Pharmacy phone numbers After 10:00 PM, call Main Pharmacy [phone]

## 2024-10-10 NOTE — Progress Notes (Signed)
 PHARMACY - ANTICOAGULATION CONSULT NOTE  Pharmacy Consult for Heparin  Indication:  arterial insufficiency / occlusion  Allergies  Allergen Reactions   Ancef  [Cefazolin ] Anaphylaxis    Patient Measurements: Height: 5' 9 (175.3 cm) Weight: 85.3 kg (188 lb) IBW/kg (Calculated) : 70.7 HEPARIN  DW (KG): 85.3 Heparin  dosing weight = 83 kg  Vital Signs: Temp: 98.1 F (36.7 C) (10/12 0445) Temp Source: Oral (10/12 0445) BP: 87/68 (10/12 0445) Pulse Rate: 93 (10/12 0445)  Labs: Recent Labs    10/07/24 2206 10/08/24 0045 10/08/24 0353 10/08/24 0947 10/09/24 0410 10/09/24 1734 10/10/24 0300  HGB 8.0*  --  7.2*  --  7.7*  --  7.7*  HCT 25.5*  --  22.5*  --  23.6*  --  24.1*  PLT 256  --  202  --  161  --  162  HEPARINUNFRC  --    < >  --    < > 0.79* 0.25* >1.10*  CREATININE 1.49*  --  1.11  --   --   --   --   CKTOTAL 69  --   --   --   --   --   --    < > = values in this interval not displayed.    Estimated Creatinine Clearance: 67 mL/min (by C-G formula based on SCr of 1.11 mg/dL).  Assessment: 22 YOM admitted w/ severe vascular insufficiency and compartment syndrome s/p right axillary bifemoral bypass c/b right popliteal thrombus now s/p thrombectomy and bypass. No PTA AC noted through a search of his EMR. Pharmacy consulted to start heparin  therapy.   Patient is s/p R calf lateral fasciotomy washout, debridement of skin and soft tissue on 9/29 and I&D on 10/1 and 10/8. Per ortho, switch back to heparin  in case Hgb drop is not only due to IVF.   Heparin  level undetectable on 1200 units/hr. Hgb stable at 7.7, Plts stable and wnl. Per RN, labs were drawn appropriately from opposite arm of infusion. No interruptions to infusion and no signs of bleeding.  Goal of Therapy:  Heparin  level 0.3-0.5 units/ml Monitor platelets by anticoagulation protocol: Yes    Plan: Increase heparin  to 1350 units/hr Recheck 6 hr heparin  level Monitor CBC, signs of bleeding F/u plan to  transition to apixaban  Thank you for allowing pharmacy to be a part of this patient's care.  Dionicia Canavan, PharmD, RPh PGY1 Acute Care Pharmacy Resident [facility]  10/10/2024 1:23 PM

## 2024-10-10 NOTE — Progress Notes (Signed)
 PROGRESS NOTE    David King  FMW:[id number] DOB: 1956/02/16 DOA: 09/24/2024 PCP: [name], MD   Brief Narrative:    68 y.o. male with PMH significant for HTN, HLD, retinal detachment, prostate cancer, PAD s/p b/l common [place] artery stenting, chronic back pain, lumbar radiculopathy.Because of progressive radiculopathy pain, patient was following up with orthopedics as an outpatient.  He had previous L4-L5 disc decompression.  Recent MRI showed severe stenosis of the left L4-L5 due to large disc herniation.   -- 09/08/2024, patient underwent revision L4-L5 decompression, lumbar body fusion by Dr. Beuford.  During the procedure, patient had an anaphylactic reaction to an unknown agent and developed hives with some hypotension. He required short-term intubation and transferred to ICU, post recovery he was noted to have some improvement in his left leg pain, postop site looked okay but he developed some right lower extremity weakness.  Neurology was consulted.  MRI brain was unremarkable.  EMG showed nonspecific changes.  -- 9/17, discharged to CIR. 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 -- 9/26, patient was readmitted to acute care with a plan of revascularization.  Patient underwent right axillary femoral bypass graft, bilateral common femoral, superficial femoral and profundofemoral thrombectomy, right leg anterior compartment fasciotomy due to compartment syndrome, right femoral endarterectomy. Soon after coming out of the OR, he was found to have decrease in his right lower extremity pulses again. Seen by vascular surgery, taken to the OR again.  Underwent right leg angiogram, found to have thrombus in the popliteal artery, underwent successful thrombectomy. Postop, admitted  to [facility] -- 9/29, underwent right calf lateral fasciotomy washout, skin debridement and wound VAC placement.  -09/28/2024 right calf lateral fasciotomy without any skin debridement and wound VAC placement - 09/29/2024 excisional debridement skin and soft tissue muscle and fascia anterior compartment of the right leg transfer of the lateral compartment muscles to the anterior compartment to cover the anterior tibial vessels.  Dr. Harden 10/01/2024: repeat debridement with vascular I&D and excisional debridement 10/8:Excisional debridement right calf with excision of skin soft tissue muscle and fascia. Local tissue transfer for wound closure 35 x 10 cm. Application Kerecis micro graft 95 cm x 2 and 38 cm x 1. Application of cleanse choice wound VAC sponges x 1 and peel in place wound VAC sponges x 2 Doppler to verify arterial competence 10/10: Hypotensive, Ordered 1 unit of PRBC  Pending insurance auth to go back to CIR.  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  But right foot with no motor or sensory function.   09/29/2024 Dr. Harden S/P  Excisional  debridement skin soft tissue muscle  and fascia anterior compartment right leg.  Transfer of the lateral compartment muscles to the anterior compartment to cover the anterior tibial vessels.  Repeat debridement 10/01/2024 with vascular status post I&D and excisional debridement 10/8: Excisional debridement right calf with excision of skin soft tissue muscle and fascia. Local tissue transfer for wound closure 35 x 10 cm. Application Kerecis micro graft 95 cm x 2 and 38 cm x 1. Application of cleanse choice wound VAC sponges x 1 and peel in place wound VAC sponges x 2 Doppler to verify arterial competence He still does not have sensation in his right foot despite revascularization.   Lumbar radiculopathy S/p recent L4-L5 decompression/fusion -9/10 Dr Beuford Continue morphine  and OxyContin  and oxycodone  Lyrica    Acute blood loss anemia- Received one unit of PRBC on 10/10. Hb today is 7.7 up from 7.2 No evidence of active bleeding. F/u H&H closely.  AKI Resolved with IV hydration.     Acute urinary retention Foul-smelling urine H/o prostate cancer he had Foley catheter inserted on admission.  This was removed on 27 September.  Since then he required multiple In-N-Out catheterization.  With routine reminders he is able to urinate without having a Foley in place.  Continue Flomax .   Hyponatremia Mild.  Continue to monitor.   Hyperlipidemia Crestor  20 mg daily   COPD Bronchodilators as needed   Diverticulosis with constipation  KUB done on 10/04/2024 showed no bowel obstruction.   Anxiety/Major moderate depression Celexa  20 mg daily, Cymbalta  nightly, Ambien  nightly as needed   Pressure injury bilateral buttocks and right heel deep tissue pressure injury seen by wound care.   Severe malnutrition due to acute illness as evidenced by moderate muscle depletion mild fat depletion.  Dietary consulted continue Ensure mighty shake Juven twice daily multivitamin.  Disposition: Back to CIR  DVT prophylaxis: on IV Heparin   drip     Code Status: Full Code Family Communication:  None at the bedside Status is: Inpatient Remains inpatient appropriate because: LE debridements  Subjective:  No acute issues overnight. Still complaining of RLE pain. Discussed with his nurse at bedside about pain management and need to monitor BP closely with a goal MAP of 65 and above.  Examination:  General exam: NAD, nasal oxygen  cannula in place Respiratory system: Clear to auscultation. Respiratory effort normal. Cardiovascular system: S1 & S2 heard, RRR. No JVD, murmurs, rubs, gallops or clicks. No pedal edema. Gastrointestinal system: Abdomen is nondistended, soft and nontender. No organomegaly or masses felt. Normal bowel sounds heard. Central nervous system: Alert and oriented.  Extremities: RLE dressing and wound vac x 2 in place   Wound 10/05/24 0815 Pressure Injury Buttocks Right;Left Stage 1 -  Intact skin with non-blanchable redness of a localized area usually over a bony prominence. (Active)     Diet Orders (From admission, onward)     Start     Ordered   10/06/24 1535  Diet regular Room service appropriate? Yes with Assist; Fluid consistency: Thin  Diet effective now       Question Answer Comment  Room service appropriate? Yes with Assist   Fluid consistency: Thin      10/06/24 1534            Objective: Vitals:   10/10/24 0000 10/10/24 0100 10/10/24 0445 10/10/24 0700  BP: (!) 80/57 92/65 (!) 87/68 95/70  Pulse: 95  93 84  Resp: 19 18 14 18   Temp: 98.4 F (36.9 C)  98.1  F (36.7 C) 97.6 F (36.4 C)  TempSrc: Oral  Oral Oral  SpO2: 98%  95% 94%  Weight:   85.3 kg   Height:   5' 9 (1.753 m)     Intake/Output Summary (Last 24 hours) at 10/10/2024 0952 Last data filed at 10/10/2024 0810 Gross per 24 hour  Intake 382.2 ml  Output 1700 ml  Net -1317.8 ml   Filed Weights   10/08/24 0540 10/09/24 0603 10/10/24 0445  Weight: 88.5 kg 89.8 kg 85.3 kg    Scheduled Meds:  ascorbic acid    500 mg Oral Daily   aspirin  EC  81 mg Oral Daily   budesonide -glycopyrrolate -formoterol   2 puff Inhalation BID   citalopram   20 mg Oral Daily   doxycycline  100 mg Oral Q12H   DULoxetine   30 mg Oral QHS   feeding supplement  237 mL Oral BID BM   multivitamin with minerals  1 tablet Oral Daily   nutrition supplement (JUVEN)  1 packet Oral BID BM   oxyCODONE   10 mg Oral Q12H   polyethylene glycol  17 g Oral Daily   pregabalin   150 mg Oral TID   rosuvastatin   20 mg Oral Daily   senna-docusate  1 tablet Oral QHS   tamsulosin   0.4 mg Oral QPC supper   thiamine  100 mg Oral Daily   zinc  sulfate (50mg  elemental zinc )  220 mg Oral Daily   Continuous Infusions:  heparin  1,200 Units/hr (10/10/24 0436)    Nutritional status Signs/Symptoms: moderate muscle depletion, mild fat depletion, energy intake < or equal to 50% for > or equal to 5 days Interventions: Refer to RD note for recommendations Body mass index is 27.76 kg/m.  Data Reviewed:   CBC: Recent Labs  Lab 10/07/24 0313 10/07/24 2206 10/08/24 0353 10/09/24 0410 10/10/24 0300  WBC 14.6* 18.5* 14.3* 12.0* 12.0*  NEUTROABS 11.3*  --  10.2*  --   --   HGB 9.2* 8.0* 7.2* 7.7* 7.7*  HCT 28.3* 25.5* 22.5* 23.6* 24.1*  MCV 95.3 98.1 96.6 92.5 92.3  PLT 266 256 202 161 162   Basic Metabolic Panel: Recent Labs  Lab 10/07/24 2206 10/08/24 0353  NA 134* 132*  K 4.5 3.8  CL 96* 99  CO2 27 22  GLUCOSE 122* 102*  BUN 35* 28*  CREATININE 1.49* 1.11  CALCIUM  8.7* 7.7*  MG 2.2  --   PHOS 3.3  --    GFR: Estimated Creatinine Clearance: 67 mL/min (by C-G formula based on SCr of 1.11 mg/dL). Liver Function Tests: No results for input(s): AST, ALT, ALKPHOS, BILITOT, PROT, ALBUMIN  in the last 168 hours.  No results for input(s): LIPASE, AMYLASE in the last 168 hours. No results for input(s): AMMONIA in the last 168 hours. Coagulation Profile: No results for input(s): INR, PROTIME in the last 168  hours. Cardiac Enzymes: Recent Labs  Lab 10/07/24 2206  CKTOTAL 69   BNP (last 3 results) No results for input(s): PROBNP in the last 8760 hours. HbA1C: No results for input(s): HGBA1C in the last 72 hours. CBG: No results for input(s): GLUCAP in the last 168 hours.  Lipid Profile: No results for input(s): CHOL, HDL, LDLCALC, TRIG, CHOLHDL, LDLDIRECT in the last 72 hours. Thyroid Function Tests: No results for input(s): TSH, T4TOTAL, FREET4, T3FREE, THYROIDAB in the last 72 hours. Anemia Panel: No results for input(s): VITAMINB12, FOLATE, FERRITIN, TIBC, IRON, RETICCTPCT in the last 72 hours. Sepsis Labs: Recent Labs  Lab 10/07/24 2206 10/08/24 0045  LATICACIDVEN 3.5* 1.2    Recent Results (from the past 240 hours)  MRSA Next Gen by PCR, Nasal     Status: None   Collection Time: 10/05/24  7:36 PM   Specimen: Nasal Mucosa; Nasal Swab  Result Value Ref Range Status   MRSA by PCR Next Gen NOT DETECTED NOT DETECTED Final    Comment: (NOTE) The GeneXpert MRSA Assay (FDA approved for NASAL specimens only), is one component of a comprehensive MRSA colonization surveillance program. It is not intended to diagnose MRSA infection nor to guide or monitor treatment for MRSA infections. Test performance is not FDA approved in patients less than 51 years old. Performed at [facility], [address] [place], [place] [address]   Aerobic/Anaerobic Culture w Gram Stain (surgical/deep wound)     Status: None (Preliminary result)   Collection Time: 10/06/24 10:23 AM   Specimen: Soft Tissue, Other  Result Value Ref Range Status   Specimen Description TISSUE  Final   Special Requests FASCIA RIGHT LEG  Final   Gram Stain NO WBC SEEN NO ORGANISMS SEEN   Final   Culture   Final    NO GROWTH 3 DAYS NO ANAEROBES ISOLATED; CULTURE IN PROGRESS FOR 5 DAYS Performed at [facility], [address], [place], [place] [address]    Report  Status PENDING  Incomplete  Culture, blood (Routine X 2) w Reflex to ID Panel     Status: None (Preliminary result)   Collection Time: 10/07/24 10:06 PM   Specimen: BLOOD  Result Value Ref Range Status   Specimen Description BLOOD SITE NOT SPECIFIED  Final   Special Requests   Final    BOTTLES DRAWN AEROBIC AND ANAEROBIC Blood Culture adequate volume   Culture   Final    NO GROWTH 3 DAYS Performed at [facility], [address] [place], [place] [address]    Report Status PENDING  Incomplete  Culture, blood (Routine X 2) w Reflex to ID Panel     Status: None (Preliminary result)   Collection Time: 10/07/24 10:09 PM   Specimen: BLOOD  Result Value Ref Range Status   Specimen Description BLOOD SITE NOT SPECIFIED  Final   Special Requests   Final    BOTTLES DRAWN AEROBIC AND ANAEROBIC Blood Culture adequate volume   Culture   Final    NO GROWTH 3 DAYS Performed at [facility], [address] [place], [place] [address]    Report Status PENDING  Incomplete         Radiology Studies: DG Lumbar Spine 2-3 Views Result Date: 10/08/2024 CLINICAL DATA:  Postoperative check EXAM: LUMBAR SPINE - 2-3 VIEW COMPARISON:  Lumbar localization image [id number]. MRI lumbar spine 09/13/2024 FINDINGS: Five lumbar type vertebral bodies. Postoperative changes with posterior rod and screw fixation at L4-5, intervertebral prosthesis at L4-5, and intervertebral prosthesis with anterior screw fixation at L5-S1. aortoiliac vascular stents. Mild lumbar scoliosis convex towards the left. No anterior subluxation. Diffuse degenerative change throughout the lumbar spine with narrowed interspaces and endplate osteophyte formation. No vertebral compression deformities. Visualized sacrum appears intact. IMPRESSION: Degenerative and postoperative changes in the lumbar spine as described. No acute displaced fractures are identified. Electronically Signed   By: Elsie Gravely M.D.   On: 10/08/2024 20:19        LOS: 16 days   Time spent= 39 mins    Deliliah Room, MD [facility]  If 7PM-7AM, please contact night-coverage  10/10/2024,  9:52 AM

## 2024-10-10 NOTE — Plan of Care (Signed)

## 2024-10-10 NOTE — Progress Notes (Signed)
 PHARMACY - ANTICOAGULATION CONSULT NOTE  Pharmacy Consult for heparin  Indication: arterial occlusion  Labs: Recent Labs    10/07/24 2206 10/08/24 0045 10/08/24 0353 10/08/24 0947 10/09/24 0410 10/09/24 1734 10/10/24 0300  HGB 8.0*  --  7.2*  --  7.7*  --  7.7*  HCT 25.5*  --  22.5*  --  23.6*  --  24.1*  PLT 256  --  202  --  161  --  162  HEPARINUNFRC  --    < >  --    < > 0.79* 0.25* >1.10*  CREATININE 1.49*  --  1.11  --   --   --   --   CKTOTAL 69  --   --   --   --   --   --    < > = values in this interval not displayed.   Assessment: 68yo male remains now supratherapeutic on heparin  after rate change; no infusion issues or overt signs of bleeding per RN.  Goal of Therapy:  Heparin  level 0.3-0.5 units/ml   Plan:  Hold heparin  infusion x1h then resume at decreased rate 1200 units/hr. Check level in 6 hours.   Marvetta Dauphin, PharmD, BCPS 10/10/2024 3:38 AM

## 2024-10-11 ENCOUNTER — Inpatient Hospital Stay (HOSPITAL_COMMUNITY)
Admission: AD | Admit: 2024-10-11 | Discharge: 2024-11-05 | DRG: 949 | Disposition: A | Discharge status: Home Health | Source: Intra-hospital | Attending: Physical Medicine and Rehabilitation | Admitting: Physical Medicine and Rehabilitation

## 2024-10-11 DIAGNOSIS — R197 Diarrhea, unspecified: Secondary | ICD-10-CM | POA: Diagnosis not present

## 2024-10-11 DIAGNOSIS — I951 Orthostatic hypotension: Secondary | ICD-10-CM | POA: Diagnosis present

## 2024-10-11 DIAGNOSIS — M5416 Radiculopathy, lumbar region: Principal | ICD-10-CM | POA: Diagnosis present

## 2024-10-11 DIAGNOSIS — I10 Essential (primary) hypertension: Secondary | ICD-10-CM | POA: Diagnosis present

## 2024-10-11 DIAGNOSIS — F419 Anxiety disorder, unspecified: Secondary | ICD-10-CM | POA: Diagnosis present

## 2024-10-11 DIAGNOSIS — Z48812 Encounter for surgical aftercare following surgery on the circulatory system: Principal | ICD-10-CM

## 2024-10-11 DIAGNOSIS — Z7982 Long term (current) use of aspirin: Secondary | ICD-10-CM

## 2024-10-11 DIAGNOSIS — K7460 Unspecified cirrhosis of liver: Secondary | ICD-10-CM | POA: Diagnosis present

## 2024-10-11 DIAGNOSIS — T79A21D Traumatic compartment syndrome of right lower extremity, subsequent encounter: Secondary | ICD-10-CM | POA: Diagnosis not present

## 2024-10-11 DIAGNOSIS — I739 Peripheral vascular disease, unspecified: Secondary | ICD-10-CM | POA: Diagnosis present

## 2024-10-11 DIAGNOSIS — R918 Other nonspecific abnormal finding of lung field: Secondary | ICD-10-CM | POA: Diagnosis not present

## 2024-10-11 DIAGNOSIS — R410 Disorientation, unspecified: Secondary | ICD-10-CM | POA: Diagnosis not present

## 2024-10-11 DIAGNOSIS — R251 Tremor, unspecified: Secondary | ICD-10-CM | POA: Diagnosis present

## 2024-10-11 DIAGNOSIS — E785 Hyperlipidemia, unspecified: Secondary | ICD-10-CM | POA: Diagnosis present

## 2024-10-11 DIAGNOSIS — T79A21A Traumatic compartment syndrome of right lower extremity, initial encounter: Principal | ICD-10-CM | POA: Diagnosis present

## 2024-10-11 DIAGNOSIS — F411 Generalized anxiety disorder: Secondary | ICD-10-CM

## 2024-10-11 DIAGNOSIS — E43 Unspecified severe protein-calorie malnutrition: Secondary | ICD-10-CM | POA: Diagnosis present

## 2024-10-11 DIAGNOSIS — R32 Unspecified urinary incontinence: Secondary | ICD-10-CM | POA: Diagnosis not present

## 2024-10-11 DIAGNOSIS — Z79899 Other long term (current) drug therapy: Secondary | ICD-10-CM | POA: Diagnosis not present

## 2024-10-11 DIAGNOSIS — K5900 Constipation, unspecified: Secondary | ICD-10-CM | POA: Diagnosis not present

## 2024-10-11 DIAGNOSIS — K5901 Slow transit constipation: Secondary | ICD-10-CM | POA: Diagnosis not present

## 2024-10-11 DIAGNOSIS — I77819 Aortic ectasia, unspecified site: Secondary | ICD-10-CM | POA: Diagnosis not present

## 2024-10-11 DIAGNOSIS — M79A21 Nontraumatic compartment syndrome of right lower extremity: Secondary | ICD-10-CM | POA: Diagnosis present

## 2024-10-11 DIAGNOSIS — I7143 Infrarenal abdominal aortic aneurysm, without rupture: Secondary | ICD-10-CM | POA: Diagnosis present

## 2024-10-11 DIAGNOSIS — I700 Atherosclerosis of aorta: Secondary | ICD-10-CM | POA: Diagnosis not present

## 2024-10-11 DIAGNOSIS — Z881 Allergy status to other antibiotic agents status: Secondary | ICD-10-CM

## 2024-10-11 DIAGNOSIS — H2012 Chronic iridocyclitis, left eye: Secondary | ICD-10-CM | POA: Diagnosis present

## 2024-10-11 DIAGNOSIS — I513 Intracardiac thrombosis, not elsewhere classified: Secondary | ICD-10-CM | POA: Diagnosis present

## 2024-10-11 DIAGNOSIS — J189 Pneumonia, unspecified organism: Secondary | ICD-10-CM | POA: Diagnosis not present

## 2024-10-11 DIAGNOSIS — M21371 Foot drop, right foot: Secondary | ICD-10-CM | POA: Diagnosis present

## 2024-10-11 DIAGNOSIS — Z8042 Family history of malignant neoplasm of prostate: Secondary | ICD-10-CM

## 2024-10-11 DIAGNOSIS — R7989 Other specified abnormal findings of blood chemistry: Secondary | ICD-10-CM | POA: Diagnosis not present

## 2024-10-11 DIAGNOSIS — G8918 Other acute postprocedural pain: Secondary | ICD-10-CM

## 2024-10-11 DIAGNOSIS — Z7901 Long term (current) use of anticoagulants: Secondary | ICD-10-CM

## 2024-10-11 DIAGNOSIS — Z87891 Personal history of nicotine dependence: Secondary | ICD-10-CM | POA: Diagnosis not present

## 2024-10-11 DIAGNOSIS — Z6828 Body mass index (BMI) 28.0-28.9, adult: Secondary | ICD-10-CM

## 2024-10-11 DIAGNOSIS — Z9079 Acquired absence of other genital organ(s): Secondary | ICD-10-CM | POA: Diagnosis not present

## 2024-10-11 DIAGNOSIS — E876 Hypokalemia: Secondary | ICD-10-CM | POA: Diagnosis present

## 2024-10-11 DIAGNOSIS — Z9842 Cataract extraction status, left eye: Secondary | ICD-10-CM | POA: Diagnosis not present

## 2024-10-11 DIAGNOSIS — Z961 Presence of intraocular lens: Secondary | ICD-10-CM | POA: Diagnosis present

## 2024-10-11 DIAGNOSIS — Z9889 Other specified postprocedural states: Secondary | ICD-10-CM | POA: Diagnosis not present

## 2024-10-11 DIAGNOSIS — J449 Chronic obstructive pulmonary disease, unspecified: Secondary | ICD-10-CM | POA: Diagnosis present

## 2024-10-11 DIAGNOSIS — R109 Unspecified abdominal pain: Secondary | ICD-10-CM | POA: Diagnosis not present

## 2024-10-11 DIAGNOSIS — R5381 Other malaise: Secondary | ICD-10-CM | POA: Diagnosis present

## 2024-10-11 DIAGNOSIS — D62 Acute posthemorrhagic anemia: Secondary | ICD-10-CM | POA: Diagnosis present

## 2024-10-11 DIAGNOSIS — Z981 Arthrodesis status: Secondary | ICD-10-CM

## 2024-10-11 DIAGNOSIS — J9811 Atelectasis: Secondary | ICD-10-CM | POA: Diagnosis not present

## 2024-10-11 DIAGNOSIS — Z8546 Personal history of malignant neoplasm of prostate: Secondary | ICD-10-CM | POA: Diagnosis not present

## 2024-10-11 DIAGNOSIS — R339 Retention of urine, unspecified: Secondary | ICD-10-CM | POA: Diagnosis not present

## 2024-10-11 DIAGNOSIS — Y95 Nosocomial condition: Secondary | ICD-10-CM | POA: Diagnosis not present

## 2024-10-11 DIAGNOSIS — Z79891 Long term (current) use of opiate analgesic: Secondary | ICD-10-CM

## 2024-10-11 LAB — CBC
HCT: 27.8 % — ABNORMAL LOW (ref 39.0–52.0)
Hemoglobin: 8.9 g/dL — ABNORMAL LOW (ref 13.0–17.0)
MCH: 29.6 pg (ref 26.0–34.0)
MCHC: 32 g/dL (ref 30.0–36.0)
MCV: 92.4 fL (ref 80.0–100.0)
Platelets: 181 K/uL (ref 150–400)
RBC: 3.01 MIL/uL — ABNORMAL LOW (ref 4.22–5.81)
RDW: 18.6 % — ABNORMAL HIGH (ref 11.5–15.5)
WBC: 12.6 K/uL — ABNORMAL HIGH (ref 4.0–10.5)
nRBC: 0 % (ref 0.0–0.2)

## 2024-10-11 LAB — HEPARIN LEVEL (UNFRACTIONATED): Heparin Unfractionated: 0.1 [IU]/mL — ABNORMAL LOW (ref 0.30–0.70)

## 2024-10-11 MED ORDER — ADULT MULTIVITAMIN W/MINERALS CH: 1.0000 | ORAL_TABLET | ORAL | Status: DC

## 2024-10-11 MED ORDER — ROSUVASTATIN CALCIUM 20 MG PO TABS
20.0000 mg | ORAL_TABLET | Freq: Every day | ORAL | Status: DC
  Administered 2024-10-12 – 2024-11-05 (×25): 20 mg via ORAL
  Filled 2024-10-11 (×25): qty 1

## 2024-10-11 MED ORDER — ALUM & MAG HYDROXIDE-SIMETH 200-200-20 MG/5ML PO SUSP: 30.0000 mL | Freq: Four times a day (QID) | ORAL | Status: DC | PRN

## 2024-10-11 MED ORDER — VITAMIN B-1 100 MG PO TABS: 100.0000 mg | ORAL_TABLET | Freq: Every day | ORAL | Status: DC

## 2024-10-11 MED ORDER — JUVEN PO PACK
1.0000 | PACK | Freq: Two times a day (BID) | ORAL | Status: DC
  Administered 2024-10-12 – 2024-11-04 (×37): 1 via ORAL
  Filled 2024-10-11 (×40): qty 1

## 2024-10-11 MED ORDER — ENSURE PLUS HIGH PROTEIN PO LIQD
237.0000 mL | Freq: Two times a day (BID) | ORAL | Status: DC
  Administered 2024-10-12 – 2024-11-03 (×21): 237 mL via ORAL

## 2024-10-11 MED ORDER — CITALOPRAM HYDROBROMIDE 20 MG PO TABS
20.0000 mg | ORAL_TABLET | Freq: Every day | ORAL | Status: DC
  Administered 2024-10-12 – 2024-11-05 (×25): 20 mg via ORAL
  Filled 2024-10-11 (×25): qty 1

## 2024-10-11 MED ORDER — ENSURE PLUS HIGH PROTEIN PO LIQD: 237.0000 mL | Freq: Two times a day (BID) | ORAL | Status: DC

## 2024-10-11 MED ORDER — OXYCODONE HCL 5 MG PO TABS
10.0000 mg | ORAL_TABLET | ORAL | Status: DC | PRN
Start: 2024-10-11 — End: 2024-10-18
  Administered 2024-10-12 – 2024-10-14 (×3): 10 mg via ORAL
  Filled 2024-10-11 (×3): qty 2

## 2024-10-11 MED ORDER — APIXABAN 5 MG PO TABS
5.0000 mg | ORAL_TABLET | Freq: Two times a day (BID) | ORAL | Status: DC
  Administered 2024-10-11 – 2024-11-05 (×50): 5 mg via ORAL
  Filled 2024-10-11 (×51): qty 1

## 2024-10-11 MED ORDER — DULOXETINE HCL 30 MG PO CPEP
30.0000 mg | ORAL_CAPSULE | Freq: Every day | ORAL | Status: DC
  Administered 2024-10-11 – 2024-11-03 (×23): 30 mg via ORAL
  Filled 2024-10-11 (×24): qty 1

## 2024-10-11 MED ORDER — ZINC SULFATE 220 (50 ZN) MG PO CAPS
220.0000 mg | ORAL_CAPSULE | Freq: Every day | ORAL | Status: AC
  Administered 2024-10-12 – 2024-10-21 (×10): 220 mg via ORAL
  Filled 2024-10-11 (×10): qty 1

## 2024-10-11 MED ORDER — BUDESON-GLYCOPYRROL-FORMOTEROL 160-9-4.8 MCG/ACT IN AERO: 2.0000 | INHALATION_SPRAY | Freq: Two times a day (BID) | RESPIRATORY_TRACT | Status: DC

## 2024-10-11 MED ORDER — TAMSULOSIN HCL 0.4 MG PO CAPS
0.4000 mg | ORAL_CAPSULE | Freq: Every day | ORAL | Status: DC
  Administered 2024-10-11 – 2024-11-04 (×25): 0.4 mg via ORAL
  Filled 2024-10-11 (×25): qty 1

## 2024-10-11 MED ORDER — ORAL CARE MOUTH RINSE: 15.0000 mL | OROMUCOSAL | Status: DC | PRN

## 2024-10-11 MED ORDER — DOXYCYCLINE HYCLATE 100 MG PO TABS
100.0000 mg | ORAL_TABLET | Freq: Two times a day (BID) | ORAL | Status: AC
  Administered 2024-10-11 – 2024-10-26 (×31): 100 mg via ORAL
  Filled 2024-10-11 (×32): qty 1

## 2024-10-11 MED ORDER — BUDESON-GLYCOPYRROL-FORMOTEROL 160-9-4.8 MCG/ACT IN AERO
2.0000 | INHALATION_SPRAY | Freq: Two times a day (BID) | RESPIRATORY_TRACT | Status: DC
  Administered 2024-10-12 – 2024-10-13 (×3): 2 via RESPIRATORY_TRACT
  Filled 2024-10-11: qty 5.9

## 2024-10-11 MED ORDER — ZOLPIDEM TARTRATE 5 MG PO TABS
5.0000 mg | ORAL_TABLET | Freq: Every evening | ORAL | Status: DC | PRN
  Administered 2024-10-16 – 2024-11-04 (×16): 5 mg via ORAL
  Filled 2024-10-11 (×16): qty 1

## 2024-10-11 MED ORDER — ADULT MULTIVITAMIN W/MINERALS CH
1.0000 | ORAL_TABLET | ORAL | Status: DC
  Administered 2024-10-12 – 2024-11-04 (×24): 1 via ORAL
  Filled 2024-10-11 (×24): qty 1

## 2024-10-11 MED ORDER — OXYCODONE HCL ER 10 MG PO T12A
10.0000 mg | EXTENDED_RELEASE_TABLET | Freq: Two times a day (BID) | ORAL | Status: AC
Start: 2024-10-11 — End: ?
  Administered 2024-10-11 – 2024-10-15 (×8): 10 mg via ORAL
  Filled 2024-10-11 (×8): qty 1

## 2024-10-11 MED ORDER — PREGABALIN 75 MG PO CAPS
150.0000 mg | ORAL_CAPSULE | Freq: Three times a day (TID) | ORAL | Status: DC
  Administered 2024-10-11 – 2024-11-05 (×74): 150 mg via ORAL
  Filled 2024-10-11 (×8): qty 2
  Filled 2024-10-11: qty 6
  Filled 2024-10-11 (×65): qty 2

## 2024-10-11 MED ORDER — ADULT MULTIVITAMIN W/MINERALS CH: 1.0000 | ORAL_TABLET | ORAL | Status: AC

## 2024-10-11 MED ORDER — DOXYCYCLINE HYCLATE 100 MG PO TABS: 100.0000 mg | ORAL_TABLET | Freq: Two times a day (BID) | ORAL | Status: DC

## 2024-10-11 MED ORDER — ONDANSETRON HCL 4 MG/2ML IJ SOLN: 4.0000 mg | Freq: Four times a day (QID) | INTRAMUSCULAR | Status: DC | PRN

## 2024-10-11 MED ORDER — IBUPROFEN 400 MG PO TABS
400.0000 mg | ORAL_TABLET | ORAL | Status: DC | PRN
  Administered 2024-10-12: 400 mg via ORAL
  Filled 2024-10-11 (×2): qty 1

## 2024-10-11 MED ORDER — ASCORBIC ACID 500 MG PO TABS: 500.0000 mg | ORAL_TABLET | Freq: Every day | ORAL | Status: AC

## 2024-10-11 MED ORDER — SENNOSIDES-DOCUSATE SODIUM 8.6-50 MG PO TABS
1.0000 | ORAL_TABLET | Freq: Every day | ORAL | Status: DC
  Administered 2024-10-11: 1 via ORAL
  Filled 2024-10-11: qty 1

## 2024-10-11 MED ORDER — LOPERAMIDE HCL 2 MG PO CAPS: 2.0000 mg | ORAL_CAPSULE | ORAL | Status: DC | PRN

## 2024-10-11 MED ORDER — THIAMINE MONONITRATE 100 MG PO TABS
100.0000 mg | ORAL_TABLET | Freq: Every day | ORAL | Status: DC
  Administered 2024-10-12 – 2024-11-05 (×25): 100 mg via ORAL
  Filled 2024-10-11 (×25): qty 1

## 2024-10-11 MED ORDER — APIXABAN 5 MG PO TABS: 5.0000 mg | ORAL_TABLET | Freq: Two times a day (BID) | ORAL | Status: DC

## 2024-10-11 MED ORDER — ALBUTEROL SULFATE (2.5 MG/3ML) 0.083% IN NEBU: 3.0000 mL | INHALATION_SOLUTION | Freq: Four times a day (QID) | RESPIRATORY_TRACT | Status: DC | PRN

## 2024-10-11 MED ORDER — POLYETHYLENE GLYCOL 3350 17 G PO PACK
17.0000 g | PACK | Freq: Every day | ORAL | Status: DC
  Administered 2024-10-12 – 2024-10-16 (×5): 17 g via ORAL
  Filled 2024-10-11 (×5): qty 1

## 2024-10-11 MED ORDER — ASPIRIN 81 MG PO TBEC
81.0000 mg | DELAYED_RELEASE_TABLET | Freq: Every day | ORAL | Status: DC
  Administered 2024-10-12 – 2024-11-05 (×25): 81 mg via ORAL
  Filled 2024-10-11 (×26): qty 1

## 2024-10-11 MED ORDER — HEPARIN BOLUS VIA INFUSION
2000.0000 [IU] | Freq: Once | INTRAVENOUS | Status: AC
  Administered 2024-10-11: 2000 [IU] via INTRAVENOUS
  Filled 2024-10-11: qty 2000

## 2024-10-11 MED ORDER — APIXABAN 5 MG PO TABS
5.0000 mg | ORAL_TABLET | Freq: Two times a day (BID) | ORAL | Status: DC
  Administered 2024-10-11: 5 mg via ORAL
  Filled 2024-10-11: qty 1

## 2024-10-11 MED ORDER — ONDANSETRON HCL 4 MG PO TABS: 4.0000 mg | ORAL_TABLET | Freq: Four times a day (QID) | ORAL | Status: DC | PRN

## 2024-10-11 MED ORDER — VITAMIN C 500 MG PO TABS
500.0000 mg | ORAL_TABLET | Freq: Every day | ORAL | Status: DC
  Administered 2024-10-12 – 2024-11-05 (×25): 500 mg via ORAL
  Filled 2024-10-11 (×25): qty 1

## 2024-10-11 MED ORDER — JUVEN PO PACK: 1.0000 | PACK | Freq: Two times a day (BID) | ORAL | Status: DC

## 2024-10-11 NOTE — Progress Notes (Signed)
  Progress Note    10/11/2024 9:11 AM 5 Days Post-Op  Subjective: was asked by RN to look at right groin due to some separation of incision. no major complaints this morning. A little confused this morning. Thought his wife was sitting in chair in the room   Vitals:   10/11/24 0807 10/11/24 0854  BP:    Pulse:    Resp:    Temp: 98.7 F (37.1 C)   SpO2:  95%   Physical Exam: Cardiac:  regular Lungs:  non labored Incisions:  Right axillary incision c/d/I, left groin incision healing well, right groin incision with small area of superficial skin separation, no drainage otherwise incision is well appearing. Right leg with VAC to suction Extremities:  BLE well perfused and warm with AT and PT Doppler Neurologic: alert and oriented to self   CBC    Component Value Date/Time   WBC 12.0 (H) 10/10/2024 0300   RBC 2.61 (L) 10/10/2024 0300   HGB 7.7 (L) 10/10/2024 0300   HCT 24.1 (L) 10/10/2024 0300   PLT 162 10/10/2024 0300   MCV 92.3 10/10/2024 0300   MCH 29.5 10/10/2024 0300   MCHC 32.0 10/10/2024 0300   RDW 19.9 (H) 10/10/2024 0300   LYMPHSABS 1.9 10/08/2024 0353   MONOABS 1.2 (H) 10/08/2024 0353   EOSABS 0.1 10/08/2024 0353   BASOSABS 0.1 10/08/2024 0353    BMET    Component Value Date/Time   NA 132 (L) 10/08/2024 0353   K 3.8 10/08/2024 0353   CL 99 10/08/2024 0353   CO2 22 10/08/2024 0353   GLUCOSE 102 (H) 10/08/2024 0353   BUN 28 (H) 10/08/2024 0353   CREATININE 1.11 10/08/2024 0353   CREATININE 1.06 10/10/2020 0915   CALCIUM  7.7 (L) 10/08/2024 0353   GFRNONAA >60 10/08/2024 0353   GFRNONAA 73 10/10/2020 0915   GFRAA 84 10/10/2020 0915    INR    Component Value Date/Time   INR 1.2 08/22/2022 0833     Intake/Output Summary (Last 24 hours) at 10/11/2024 0911 Last data filed at 10/11/2024 0649 Gross per 24 hour  Intake --  Output 1625 ml  Net -1625 ml     Assessment/Plan:  68 y.o. 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 Right groin with very small area of superficial skin separation. No drainage. Will have RN clean with NS and apply dry gauze to wick moisture RLE wound management per Dr. Harden Teretha Damme, PA-C [facility] [phone] 10/11/2024 9:11 AM

## 2024-10-11 NOTE — Plan of Care (Signed)

## 2024-10-11 NOTE — Progress Notes (Addendum)
 PMR Admission Coordinator Pre-Admission Assessment   Patient: David King is an 68 y.o., male MRN: [id number] DOB: [birth year] Height: 5' 9 (175.3 cm) Weight: 86.2 kg   Insurance Information HMO:  yes   PPO:      PCP:      IPA:      80/20:      OTHER:  PRIMARY: UHC Medicare      Policy#: [id number], Medicare: [id number]     Subscriber: pt CM Name: UM dept      Phone#: [phone], option 8     Fax#:  [phone]  Pre-Cert#: [id number]  I received auth for CIR from Kristen with [facility]  for admit 10/13  through 10/18/24.  Updates due to UM dept at fax listed above. Employer:  Benefits:  Phone #:      Name:  Eustacio Date: 12/31/2023 - still active Deductible: $0 (does not have deductible) OOP Max: $3,800 ($788.11 met)  CIR: $355/day co-pay for days 1-5, $0/day co-pay for days 6+  SNF: $0.00 Copayment per day for days 1-20; $203 Copayment per day for days 21-100 for Medicare-covered care/maximum 100 days/benefit period  Outpatient: $20 copay/visit Home Health:  100% coverage, 0% co-insurance; limited by medical necessity  DME: 80% coverage; 20% co-insurance Providers: in network   SECONDARY:       Policy#:      Phone#:    Artist:       Phone#:    The Data processing manager" for patients in Inpatient Rehabilitation Facilities with attached "Privacy Act Statement-Health Care Records" was provided and verbally reviewed with: Patient   Emergency Contact Information Contact Information       Name Relation Home Work Mobile    [place]  [place]) Spouse [phone]   [phone]         Other Contacts   None on File        Current Medical History  Patient Admitting Diagnosis: Lumbar myelopathy History of Present Illness:  David King is a 68 year old right handed male with history significant for anxiety, migraine headaches, detached retina left eye, hypertension, prostate cancer status post radical prostatectomy 2014, COPD/quit smoking 7 years ago  as well as history of left common femoral enterectomy with bilateral common iliac artery stenting and left external iliac artery stenting November 2023 at [facility].  Per chart review patient lives with spouse.  1 level home 4 steps to entry.  Modified independent with a cane prior to admission.  Presented 09/08/2024 with low back pain radiating to the left lower extremity.  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  Underwent left-sided L4-5 transforaminal lumbar interbody fusion/posterior lateral fusion insertion of interbody device revision L4-5 decompression 09/08/2024 per Dr. Beuford.  Noted intraoperatively patient had developed hives swelling of tongue and lips along with hypotension with critical care medicine consulted possible anaphylactic shock and was started on epi drip and given Decadron .  He did require short-term intubation was transferred to the ICU and extubated 9/11.  MRI T-spine with no acute findings.  MRI lumbar spine showed L4-5 PLIF moderate residual foraminal stenosis.  Since his surgery patient had ongoing right leg weakness and numbness neurology consulted for recommendations.  MRI of the brain showed no acute changes.  Neurology felt these findings consistent with either hypersensitivity to pain or mild component of nonorganic findings.  Recommendations at that time were for outpatient EMG of right lower extremity numbness persisted.  Therapy evaluations completed with back  brace when out of bed.  Therapy evaluations completed and patient was admitted to inpatient rehab services 09/15/2024 for comprehensive rehab therapies.  During patient's hospitalization noted persistent pain and numbness of the right lower extremity earlier hospital course workup by neurology was unremarkable at that time.  Vascular surgery Dr. Penne Colorado consulted 09/19/2024 for persistent leg pain.  ABIs were not able to be done due to patient's  level of pain.  Monitoring of CK levels ranging from 3886-1434.  Diagnostic angiography completed 09/21/2024 per Dr. 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  CT angiography chest abdomen and pelvis completed showing stable infrarenal aortic aneurysm.  Due to patient's aortic occlusion findings he was discharged to acute care services 09/24/2024 and underwent right axillary bifemoral bypass graft with bilateral common femoral, superficial femoral and profundofemoral thrombectomy with redo of left common femoral artery exposure and 4 compartment right leg fasciotomy resection of necrotic muscle in the right anterior compartment with bilateral femoral enterectomy 09/24/2024 per Dr. Serene.  Patient did return to the OR same day 09/24/2024 due to popliteal signal being different than what it was initially in the operating room it appeared to be monophasic thus returned back for reexploration angiography to make sure blood flow is optimized and angiography revealed a filling defect in the popliteal artery at the level of the knee.  There was reconstitution of the posterior tibial artery which was dominant runoff.  Peroneal artery also opacified.  It was elected to reopen the medial right below-knee incision and dissected out the popliteal artery.  Patient tolerated the procedure well and was sent back to the ICU.  IV heparin  was initiated for arterial insufficiency.  Patient did require right calf lateral fasciotomy washout debridement of skin and soft tissue vacuum assisted dressing 09/27/2024 per vascular surgery Dr. Fonda Simpers followed by excisional debridement skin soft tissue muscle and fascia anterior compartment right leg.  Transfer of the lateral compartment muscles to the anterior compartment to cover the anterior tibial vessels 09/29/2024 per Dr. Harden and ultimately with right leg fasciotomy washout again completed 10/01/2024 and later completed again with excisional debridement right calf with excision of soft  tissue muscle and fascia application of wound VAC 10/06/2024 per Dr. Harden....  Patient did require episodes of ongoing hypotension 10/10 order of 1 unit packed red blood cells for hemoglobin of 7.2 latest hemoglobin 7.7.  Hospital course patient remains on doxycycline for wound coverage.  IV heparin  ongoing he was cleared to resume low-dose aspirin .  Therapy evaluations have been completed and ongoing with slow progressive gains.  Patient was readmitted back to inpatient rehab services for comprehensive therapies.    He was subsequently taken to the OR on 09/24/2024 for right axillary femoral bypass graft, bilateral common femoral, superficial femoral and profundofemoral thrombectomy, right leg anterior compartment fasciotomy due to compartment syndrome, right femoral endarterectomy. femoral endarterectomy. Soon after coming out of the OR, he was found to have decrease in his right lower extremity pulses again. Seen by vascular surgery, taken to the OR again.  Underwent right leg angiogram, found to have thrombus in the popliteal artery, underwent successful thrombectomy. See by PT/OT post operatively and they recommend return to CIR.    Patient's medical record from [facility] has been reviewed by the rehabilitation admission coordinator and physician.   Past Medical History      Past Medical History:  Diagnosis Date   Anxiety      new dx   Arthritis  lumbar   Atherosclerotic vascular disease     Cataract     COPD (chronic obstructive pulmonary disease) (HCC)     Diverticulosis     Elevated PSA     Headache(784.0)      MIGRAINES   Hypertension 2021   Iritis      CHRONIC IN LEFT EYE - SOME VISIAL IMPAIRMENT IN LEFT EYE   Neuromuscular disorder (HCC)      left leg/foot,pinched siactic nerve   Pain      PAIN LEFT HIP AND DOWN LT LEG WITH NUMBNESS IN LEFT LEG--PT STATES SCIATIC NERVE IMPINGEMENT - PT PLANS BACK IN THE NEAR SURGERY.   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 Yes   Family History   family history includes Cancer in his father and paternal grandfather.   Current Medications  Current Medications    Current Facility-Administered Medications:    albuterol  (PROVENTIL ) (2.5 MG/3ML) 0.083% nebulizer solution 3 mL, 3 mL, Inhalation, Q6H PRN, Gerome, Emma M, PA-C   alum & mag hydroxide-simeth (MAALOX/MYLANTA) 200-200-20 MG/5ML suspension 30 mL, 30 mL, Oral, Q6H PRN, Gerome, Emma M, PA-C   ascorbic acid  (VITAMIN C ) tablet 500 mg, 500 mg, Oral, Daily, Gerome Herring M, PA-C, 500 mg at 10/11/24 0941   aspirin  EC tablet 81 mg, 81 mg, Oral, Daily, Gerome Herring HERO, PA-C, 81 mg at 10/11/24 9058   budesonide -glycopyrrolate -formoterol  (BREZTRI ) 160-9-4.8 MCG/ACT inhaler 2 puff, 2 puff, Inhalation, BID, Rashid, Farhan, MD, 2 puff at 10/11/24 0854   citalopram  (CELEXA ) tablet 20 mg, 20 mg, Oral, Daily, Gerome Herring M, PA-C, 20 mg at 10/11/24 0941   doxycycline (VIBRA-TABS) tablet 100 mg, 100 mg, Oral, Q12H, Duda, Marcus V, MD, 100 mg at 10/11/24 0941   DULoxetine  (CYMBALTA ) DR capsule 30 mg, 30 mg, Oral, QHS, Collins, Emma M, PA-C, 30 mg at 10/10/24 2053   feeding supplement (ENSURE PLUS HIGH PROTEIN) liquid 237 mL, 237 mL, Oral, BID BM, Collins, Emma M, PA-C, 237 mL at 10/11/24 9048   heparin  ADULT infusion 100 units/mL (25000 units/250mL), 1,500 Units/hr, Intravenous, Continuous, Billy Rocky SAUNDERS, [facility], Last Rate: 15 mL/hr at 10/10/24 2045, 1,500 Units/hr at 10/10/24 2045   ibuprofen  (ADVIL ) tablet 400 mg, 400 mg, Oral, Q4H PRN, Rashid, Farhan, MD   loperamide (IMODIUM) capsule 2 mg, 2 mg, Oral, PRN, Gerome Herring M, PA-C, 2 mg at 10/05/24 2013   morphine  (PF) 2 MG/ML injection 2 mg, 2 mg, Intravenous, Q4H PRN, Gerome Herring M, PA-C, 2 mg at 10/07/24 1727   [START ON 10/12/2024] multivitamin with minerals tablet 1 tablet, 1 tablet, Oral, Q24H, Chen, Lydia D, RPH   naloxone  (NARCAN ) injection 0.4 mg, 0.4 mg, Intravenous, PRN, Gerome, Emma M, PA-C   nutrition supplement (JUVEN) (JUVEN) powder packet 1 packet, 1 packet, Oral, BID BM, Gerome Herring HERO, PA-C, 1 packet at 10/11/24 9057   ondansetron  (ZOFRAN ) tablet 4 mg, 4 mg, Oral, Q6H PRN **OR** ondansetron  (ZOFRAN ) injection 4 mg, 4 mg, Intravenous, Q6H PRN, Gerome, Emma M, PA-C, 4 mg at 10/06/24 1022   Oral care mouth rinse, 15 mL, Mouth Rinse, PRN, Amin, Ankit C, MD   oxyCODONE  (Oxy IR/ROXICODONE ) immediate release tablet 10 mg,  10 mg, Oral, Q4H PRN, Gerome Maurilio HERO, PA-C, 10 mg at 10/09/24 1336   oxyCODONE  (OXYCONTIN ) 12 hr tablet 10 mg, 10 mg, Oral, Q12H, Collins, Emma M, PA-C, 10 mg at 10/11/24 0941   polyethylene glycol (MIRALAX  / GLYCOLAX ) packet 17 g, 17 g, Oral, Daily, Rashid, Farhan, MD   potassium chloride  SA (KLOR-CON  M) CR tablet 40-60 mEq, 40-60 mEq, Oral, Daily PRN, Gerome, Emma M, PA-C   pregabalin  (LYRICA ) capsule 150 mg, 150 mg, Oral, TID, Collins, Emma M, PA-C, 150 mg at 10/11/24 0941   rosuvastatin  (CRESTOR ) tablet 20 mg, 20 mg, Oral, Daily, Gerome Maurilio M, PA-C, 20 mg at 10/11/24 9058   senna-docusate (Senokot-S) tablet 1 tablet, 1 tablet, Oral, QHS, Rashid, Farhan, MD, 1 tablet at 10/10/24 2053   tamsulosin  (FLOMAX ) capsule 0.4 mg, 0.4 mg, Oral, QPC supper, Gerome, Emma M, PA-C, 0.4 mg at 10/10/24 1806   thiamine (VITAMIN B1) tablet 100 mg, 100 mg, Oral, Daily, Gerome Maurilio M, PA-C, 100 mg at 10/11/24 0941   zinc  sulfate (50mg  elemental zinc ) capsule 220 mg, 220 mg, Oral, Daily, Gerome Maurilio M, PA-C, 220 mg at 10/11/24 9058   zolpidem  (AMBIEN ) tablet 5 mg, 5 mg, Oral, QHS PRN, Collins, Emma M, PA-C, 5 mg at 10/10/24 2052     Patients Current Diet:  Diet Order                  Diet regular Room service appropriate? Yes with Assist;  Fluid consistency: Thin  Diet effective now                         Precautions / Restrictions Precautions Precautions: Fall, Back, Other (comment) Precaution Booklet Issued: Yes (comment) Precaution/Restrictions Comments: watch BP (orthostatic); RLE foot drop Spinal Brace: Thoracolumbosacral orthotic, Applied in sitting position Restrictions Weight Bearing Restrictions Per Provider Order: Yes RLE Weight Bearing Per Provider Order: Weight bearing as tolerated Other Position/Activity Restrictions: transfers only on RLE    Has the patient had 2 or more falls or a fall with injury in the past year? Yes   Prior Activity Level Community (5-7x/wk): Pt. active in the community PTA   Prior Functional Level Self Care: Did the patient need help bathing, dressing, using the toilet or eating? Independent   Indoor Mobility: Did the patient need assistance with walking from room to room (with or without device)? Independent   Stairs: Did the patient need assistance with internal or external stairs (with or without device)? Independent   Functional Cognition: Did the patient need help planning regular tasks such as shopping or remembering to take medications? Needed some help   Patient Information Are you of Hispanic, Latino/a,or Spanish origin?: A. No, not of Hispanic, Latino/a, or Spanish origin What is your race?: A. White Do you need or want an interpreter to communicate with a doctor or health care staff?: 0. No   Patient's Response To:  Health Literacy and Transportation Is the patient able to respond to health literacy and transportation needs?: Yes Health Literacy - How often do you need to have someone help you when you read instructions, pamphlets, or other written material from your doctor or pharmacy?: Never In the past 12 months, has lack of transportation kept you from medical appointments or from getting medications?: No In the past 12 months, has lack of transportation  kept you from meetings, work, or from getting things needed for daily living?: No   Journalist, newspaper / Equipment  Home Equipment: Agricultural consultant (2 wheels), Shower seat, Grab bars - tub/shower, Cane - single point, Adaptive equipment, Hand held shower head, Other (comment)   Prior Device Use: Indicate devices/aids used by the patient prior to current illness, exacerbation or injury? None of the above   Current Functional Level Cognition   Orientation Level: Oriented X4    Extremity Assessment (includes Sensation/Coordination)   Upper Extremity Assessment: Generalized weakness RUE Deficits / Details: weak grip strength, denies sensation changes. LUE Deficits / Details: weak grip strength, denies sensation changes.  Lower Extremity Assessment: Defer to PT evaluation RLE Deficits / Details: formal ROM and strength assessment deferred due to pain. edema noted at R foot. flicker of ankle PF/DF noted RLE Sensation: decreased light touch LLE Deficits / Details: grossly 3/5 LLE Sensation: decreased light touch     ADLs   Overall ADL's : Needs assistance/impaired Eating/Feeding: Sitting, Set up Grooming: Set up, Sitting Upper Body Bathing: Set up, Sitting Lower Body Bathing: Maximal assistance, Bed level Upper Body Dressing : Minimal assistance Upper Body Dressing Details (indicate cue type and reason): donning back brace Lower Body Dressing: Maximal assistance, Bed level, +2 for safety/equipment Lower Body Dressing Details (indicate cue type and reason): To don brief with rolling Toilet Transfer: Maximal assistance, +2 for physical assistance, +2 for safety/equipment, Rolling walker (2 wheels), Stand-pivot Toilet Transfer Details (indicate cue type and reason): Stand, step pivot Toileting- Clothing Manipulation and Hygiene: Moderate assistance, +2 for safety/equipment Functional mobility during ADLs: Maximal assistance, +2 for physical assistance, +2 for safety/equipment, Rolling  walker (2 wheels) General ADL Comments: Pt limited d/t soft BP     Mobility   Overal bed mobility: Needs Assistance Bed Mobility: Rolling, Sidelying to Sit Rolling: Supervision Sidelying to sit: Supervision Sit to sidelying: Supervision General bed mobility comments: S for safety, good recall of log roll technique     Transfers   Overall transfer level: Needs assistance Equipment used: Rolling walker (2 wheels) Transfers: Sit to/from Stand, Bed to chair/wheelchair/BSC Sit to Stand: Mod assist, +2 physical assistance, +2 safety/equipment, From elevated surface Bed to/from chair/wheelchair/BSC transfer type:: Step pivot Stand pivot transfers: Mod assist, +2 physical assistance, +2 safety/equipment Squat pivot transfers: Mod assist Step pivot transfers: Max assist, +2 physical assistance, +2 safety/equipment, From elevated surface Transfer via Lift Equipment: Stedy General transfer comment: Pt with good muscle to power up with min-mod +2 assist from air bed and chair surfaces, during step pivot pt with knee buckling, requiring max+2 assist to complete step pivot and bil knees blocked at times.     Ambulation / Gait / Stairs / Wheelchair Mobility   Ambulation/Gait Ambulation/Gait assistance: Max assist, +2 physical assistance Gait Distance (Feet): 4 Feet Assistive device: Rolling walker (2 wheels) Gait Pattern/deviations: Shuffle, Decreased dorsiflexion - right, Knee flexed in stance - right, Knee flexed in stance - left, Knees buckling, Step-to pattern, Narrow base of support General Gait Details: Decreased bil knee extension in stance phase, pt needs AA for RLE while abducting for pivotal steps and R and at times L knee blocked due to pt c/f buckling/weakness, also assist for RW management and steadying assist. Stepping toward chair on his R side and posterior steps a couple feet. Stairs:  (not safe to attempt today 2/2 low BP/weakness)     Posture / Balance Dynamic Sitting  Balance Sitting balance - Comments: L rail Balance Overall balance assessment: Needs assistance Sitting-balance support: Feet supported, No upper extremity supported Sitting balance-Leahy Scale: Fair Sitting balance - Comments: L  rail Standing balance support: Bilateral upper extremity supported, Reliant on assistive device for balance Standing balance-Leahy Scale: Poor Standing balance comment: Reliant on RW and external support     Special considerations/life events  Wound Vac yes    Previous Home Environment (from acute therapy documentation) Living Arrangements: Spouse/significant other  Lives With: Spouse Available Help at Discharge: Family, Available 24 hours/day Type of Home: House Home Layout: Two level Alternate Level Stairs-Number of Steps: pt does not access second level of home Home Access: Stairs to enter Entrance Stairs-Rails: Can reach both Entrance Stairs-Number of Steps: 4 Bathroom Shower/Tub: Health visitor: Handicapped height Bathroom Accessibility: Yes How Accessible: Accessible via walker Home Care Services: No Additional Comments: bench in shower HH showerhead, grab bars in shower   Discharge Living Setting Plans for Discharge Living Setting: Patient's home Type of Home at Discharge: House Discharge Home Layout: One level Discharge Home Access: Stairs to enter Entrance Stairs-Rails: Can reach both Entrance Stairs-Number of Steps: 4 Discharge Bathroom Shower/Tub: Walk-in shower Discharge Bathroom Toilet: Handicapped height Discharge Bathroom Accessibility: Yes How Accessible: Accessible via walker Does the patient have any problems obtaining your medications?: No   Social/Family/Support Systems Patient Roles: Spouse Contact Information: Romaine Neville Anticipated Caregiver: Wife Ability/Limitations of Caregiver: Wife assisting prior, neighbor also assists Caregiver Availability: 24/7 Discharge Plan Discussed with Primary Caregiver:  Yes Is Caregiver In Agreement with Plan?: Yes Does Caregiver/Family have Issues with Lodging/Transportation while Pt is in Rehab?: No   Goals Patient/Family Goal for Rehab: PT/OT supervision Expected length of stay: 10-14 days Pt/Family Agrees to Admission and willing to participate: Yes Program Orientation Provided & Reviewed with Pt/Caregiver Including Roles  & Responsibilities: Yes   Decrease burden of Care through IP rehab admission: not anticipated   Possible need for SNF placement upon discharge: not anticipated   Patient Condition: I have reviewed medical records from [facility] , spoken with CM, and patient and family member. I met with patient at the bedside for inpatient rehabilitation assessment.  Patient will benefit from ongoing PT and OT, can actively participate in 3 hours of therapy a day 5 days of the week, and can make measurable gains during the admission.  Patient will also benefit from the coordinated team approach during an Inpatient Acute Rehabilitation admission.  The patient will receive intensive therapy as well as Rehabilitation physician, nursing, social worker, and care management interventions.  Due to safety, skin/wound care, disease management, medication administration, pain management, and patient education the patient requires 24 hour a day rehabilitation nursing.  The patient is currently mod A with mobility and basic ADLs.  Discharge setting and therapy post discharge at home with home health is anticipated.  Patient has agreed to participate in the Acute Inpatient Rehabilitation Program and will admit today.   Preadmission Screen Completed By:  Leita KATHEE Kleine, 10/11/2024 12:32 PM ______________________________________________________________________   Discussed status with Dr. Babs on 10/11/24 at 900 and received approval for admission today.   Admission Coordinator:  Leita KATHEE Kleine, CCC-SLP, time 1233/Date 10/11/24     Assessment/Plan: Diagnosis: lumbar myelopathy, PAD RLE with compartment syndrome Does the need for close, 24 hr/day Medical supervision in concert with the patient's rehab needs make it unreasonable for this patient to be served in a less intensive setting? Yes Co-Morbidities requiring supervision/potential complications: wound care, behavior, pain mgt Due to bladder management, bowel management, safety, skin/wound care, disease management, medication administration, pain management, and patient education, does the patient require 24 hr/day rehab  nursing? Yes Does the patient require coordinated care of a physician, rehab nurse, PT, OT, and SLP to address physical and functional deficits in the context of the above medical diagnosis(es)? Yes Addressing deficits in the following areas: balance, endurance, locomotion, strength, transferring, bowel/bladder control, bathing, dressing, feeding, grooming, toileting, cognition, and psychosocial support Can the patient actively participate in an intensive therapy program of at least 3 hrs of therapy 5 days a week? Yes The potential for patient to make measurable gains while on inpatient rehab is excellent Anticipated functional outcomes upon discharge from inpatient rehab: supervision to min assist PT, supervision to min assist OT, n/a SLP Estimated rehab length of stay to reach the above functional goals is: 13-20 days Anticipated discharge destination: Home 10. Overall Rehab/Functional Prognosis: good     MD Signature: Arthea IVAR Gunther, MD, [facility] Physical Medicine & Rehabilitation Medical Director Rehabilitation Services 10/11/2024

## 2024-10-11 NOTE — Progress Notes (Signed)
 PHARMACY - ANTICOAGULATION CONSULT NOTE  Pharmacy Consult for Heparin  Indication:  arterial insufficiency / occlusion  Allergies  Allergen Reactions   Ancef  [Cefazolin ] Anaphylaxis    Patient Measurements: Height: 5' 9 (175.3 cm) Weight: 86.2 kg (190 lb) IBW/kg (Calculated) : 70.7 HEPARIN  DW (KG): 85.3 Heparin  dosing weight = 83 kg  Vital Signs: Temp: 98.7 F (37.1 C) (10/13 0846) Temp Source: Oral (10/13 0846) BP: 125/87 (10/13 0846) Pulse Rate: 99 (10/13 0846)  Labs: Recent Labs    10/09/24 0410 10/09/24 1734 10/10/24 0300 10/10/24 1208 10/10/24 1939 10/11/24 0836  HGB 7.7*  --  7.7*  --   --  8.9*  HCT 23.6*  --  24.1*  --   --  27.8*  PLT 161  --  162  --   --  181  HEPARINUNFRC 0.79*   < > >1.10* <0.10* 0.13* 0.10*   < > = values in this interval not displayed.    Estimated Creatinine Clearance: 67.4 mL/min (by C-G formula based on SCr of 1.11 mg/dL).  Assessment: 35 YOM admitted w/ severe vascular insufficiency and compartment syndrome s/p right axillary bifemoral bypass c/b right popliteal thrombus now s/p thrombectomy and bypass. No PTA AC noted through a search of his EMR. Pharmacy consulted to start heparin  therapy.   Patient is s/p R calf lateral fasciotomy washout, debridement of skin and soft tissue on 9/29 and I&D on 10/1 and 10/8. Per ortho, switch back to heparin  in case Hgb drop is not only due to IVF.   Heparin  level 0.1 is subtherapeutic on 1500 units/hr.  No issues with infusion or bleeding per RN. Patient was therapeutic on 1500 units/hr 10/10 and supratherapeutic on 1550 units/hr 10/11. Now very low, likely due to decreasing rate on 10/12. Level drawn 12 hours from rate increase to 1500 units/hr. Will give small bolus and continue current rate.    Goal of Therapy:  Heparin  level 0.3-0.5 units/ml Monitor platelets by anticoagulation protocol: Yes    Plan: Heparin  2000 unit bolus, continue heparin  1500 units/hr Recheck 8 hr heparin   level Monitor CBC, signs of bleeding F/u plan to transition to apixaban  Thank you for allowing pharmacy to be a part of this patient's care.  Jinnie Door, PharmD, BCPS, BCCP Clinical Pharmacist  Please check AMION for all [facility] phone numbers After 10:00 PM, call Main Pharmacy [phone]

## 2024-10-11 NOTE — Consult Note (Signed)
 WOC Nurse Consult Note: Reason for Consult:Replace VAC dressing with Peel and Place.  Former dressing had lost seal.  Blue Cleanse choice wound foam left in wound bed. Covering Kerecis micrograft.  Wound type: surgical Pressure Injury POA: No Measurement:Right lateral leg  Suture line 22 cm in length with nonintact center with nonremovable dressing in place.  Some bleeding with VAC change this AM.  Photo in chart.  Wound bed:Not assessed  nonremovable dressing in place per MD order Drainage (amount, consistency, odor)  Bleeding around foam dressing.  Cleansed periwound skin and pat dry.  Periwound: intact  staple line to right medial lower leg,  Right heel with 4 cm x 4 cm intact maroon skin.  Placed back in Prevalon boot after wound care.  Patient is confused and mumbling but pleasant.   Dressing procedure/placement/frequency:Removed old VAC drape and Iodoban using adhesive remover.  Left blue sponge in wound bed per MD order.  Cleansed periwound skin with NS and pat dry  Used 2 peel and place dressings and Y Connected the two.  Seal achieved at 125 mmHg.  Ordered another large Peel place kit but dressing will likely remain until follow up in the office.   Will not follow at this time.  Please re-consult if needed.  Darice Cooley MSN, RN, FNP-BC CWON Wound, Ostomy, Continence Nurse [facility] [phone] Work cell phone:  [phone]

## 2024-10-11 NOTE — TOC Transition Note (Signed)
 Transition of Care (TOC) - Discharge Note Rayfield Gobble RN, BSN Inpatient Care Management Unit 4E- RN Case Manager See Treatment Team for direct phone #   Patient Details  Name: David King MRN: [id number] Date of Birth: 11/06/1956  Transition of Care [facility]) CM/SW Contact:  Gobble Rayfield Hurst, RN Phone Number: 10/11/2024, 2:11 PM   Clinical Narrative:    Notified by Davene CALKINS rehab liaison that pt has received insurance auth for INPT rehab admit and has bed available today.   Pt stable for transition to INPT rehab and will transition once bed assigned by INPT rehab this afternoon.   No further INPT CM needs noted.    Final next level of care: IP Rehab Facility Barriers to Discharge: Barriers Resolved   Patient Goals and CMS Choice Patient states their goals for this hospitalization and ongoing recovery are:: return home after rehab   Choice offered to / list presented to : Patient, Spouse      Discharge Placement                 [facility] INPT rehab      Discharge Plan and Services Additional resources added to the After Visit Summary for     Discharge Planning Services: CM Consult Post Acute Care Choice: IP Rehab          DME Arranged: N/A DME Agency: NA       HH Arranged: NA HH Agency: NA        Social Drivers of Health (SDOH) Interventions SDOH Screenings   Food Insecurity: Patient Declined (09/29/2024)  Housing: Unknown (09/29/2024)  Transportation Needs: Patient Declined (09/29/2024)  Utilities: Patient Declined (09/29/2024)  Financial Resource Strain: Low Risk  (05/05/2023)   Received from [facility]  Physical Activity: Unknown (05/05/2023)   Received from [facility]  Social Connections: Unknown (09/29/2024)  Stress: No Stress Concern Present (05/05/2023)   Received from [facility]  Tobacco Use: Medium Risk (10/06/2024)     Readmission Risk Interventions    10/11/2024    2:11 PM 09/09/2024   12:49 PM  Readmission Risk Prevention Plan   Transportation Screening Complete Complete  PCP or Specialist Appt within 5-7 Days  Complete  Home Care Screening Complete Complete  Medication Review (RN CM) Complete Referral to Pharmacy

## 2024-10-11 NOTE — H&P (Signed)
 Physical Medicine and Rehabilitation Admission H&P     HPI: David King is a 68 year old right handed male with history significant for anxiety, migraine headaches, detached retina left eye, hypertension, prostate cancer status post radical prostatectomy 2014, COPD/quit smoking 7 years ago as well as history of left common femoral enterectomy with bilateral common iliac artery stenting and left external iliac artery stenting November 2023 at [facility].  Per chart review patient lives with spouse.  1 level home 4 steps to entry.  Modified independent with a cane prior to admission.  Presented 09/08/2024 with low back pain radiating to the left lower extremity.  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  Underwent left-sided L4-5 transforaminal lumbar interbody fusion/posterior lateral fusion insertion of interbody device revision L4-5 decompression 09/08/2024 per Dr. Beuford.  Noted intraoperatively patient had developed hives swelling of tongue and lips along with hypotension with critical care medicine consulted possible anaphylactic shock and was started on epi drip and given Decadron .  He did require short-term intubation was transferred to the ICU and extubated 9/11.  MRI T-spine with no acute findings.  MRI lumbar spine showed L4-5 PLIF moderate residual foraminal stenosis.  Since his surgery patient had ongoing right leg weakness and numbness neurology consulted for recommendations.  MRI of the brain showed no acute changes.  Neurology felt these findings consistent with either hypersensitivity to pain or mild component of nonorganic findings.  Recommendations at that time were for outpatient EMG of right lower extremity numbness persisted.  Therapy evaluations completed with back brace when out of bed.  Therapy evaluations completed and patient was admitted to inpatient rehab services 09/15/2024 for comprehensive rehab  therapies.  During patient's hospitalization noted persistent pain and numbness of the right lower extremity earlier hospital course workup by neurology was unremarkable at that time.  Vascular surgery Dr. Penne [place] consulted 09/19/2024 for persistent leg pain.  ABIs were not able to be done due to patient's level of pain.  Monitoring of CK levels ranging from 3886-1434.  Diagnostic angiography completed 09/21/2024 per Dr. 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  CT angiography chest abdomen and pelvis completed showing stable infrarenal aortic aneurysm.  Due to patient's aortic occlusion findings he was discharged to acute care services 09/24/2024 and underwent right axillary bifemoral bypass graft with bilateral common femoral, superficial femoral and profundofemoral thrombectomy with redo of left common femoral artery exposure and 4 compartment right leg fasciotomy resection of necrotic muscle in the right anterior compartment with bilateral femoral enterectomy 09/24/2024 per Dr. Serene.  Patient did return to the OR same day 09/24/2024 due to popliteal signal being different than what it was initially in the operating room it appeared to be monophasic thus returned back for reexploration angiography to make sure blood flow is optimized and angiography revealed a filling defect in the popliteal artery at the level of the knee.  There was reconstitution of the posterior tibial artery which was dominant runoff.  Peroneal artery also opacified.  It was elected to reopen the medial right below-knee incision and dissected out the popliteal artery.  Patient tolerated the procedure well and was sent back to the ICU.  IV heparin  was initiated for arterial insufficiency.  Patient did require right calf lateral fasciotomy washout debridement of skin and soft tissue vacuum assisted dressing 09/27/2024 per vascular surgery Dr. Fonda Simpers followed by excisional debridement skin soft tissue muscle and fascia anterior  compartment right leg.  Transfer of the lateral compartment muscles to the anterior compartment to cover the anterior tibial vessels 09/29/2024 per Dr. Harden and ultimately with right leg fasciotomy washout again completed 10/01/2024 and later completed again with excisional debridement right calf with excision of soft tissue muscle and fascia application of wound VAC 10/06/2024 per Dr. Harden....  Patient did require episodes of ongoing hypotension 10/10 order of 1 unit packed red blood cells for hemoglobin of 7.2 latest hemoglobin 7.7.  Hospital course patient remains on doxycycline for wound coverage.  IV heparin  ongoing he was cleared to resume low-dose aspirin .  Therapy evaluations have been completed and ongoing with slow progressive gains.  Patient was readmitted back to inpatient rehab services for comprehensive therapies.  Review of Systems  Constitutional:  Negative for fever.  HENT:  Negative for hearing loss.   Eyes:        Visual impairment left eye  Respiratory:  Negative for cough, shortness of breath and wheezing.   Cardiovascular:  Positive for leg swelling. Negative for chest pain and palpitations.  Gastrointestinal:  Positive for constipation. Negative for nausea.  Genitourinary:  Positive for urgency.       Nocturia  Musculoskeletal:  Positive for back pain and myalgias.  Skin:  Negative for rash.  Neurological:  Positive for sensory change, weakness and headaches.  Psychiatric/Behavioral:  Positive for depression.        Anxiety  All other systems reviewed and are negative.  Past Medical History:  Diagnosis Date   Anxiety    new dx   Arthritis    lumbar   Atherosclerotic vascular disease    Cataract    COPD (chronic obstructive pulmonary disease) (HCC)    Diverticulosis    Elevated PSA    Headache(784.0)    MIGRAINES   Hypertension 2021   Iritis    CHRONIC IN LEFT EYE - SOME VISIAL IMPAIRMENT IN LEFT EYE   Neuromuscular disorder (HCC)    left leg/foot,pinched  siactic nerve   Pain    PAIN LEFT HIP AND DOWN LT LEG WITH NUMBNESS IN LEFT LEG--PT STATES SCIATIC NERVE IMPINGEMENT - PT PLANS BACK IN THE NEAR SURGERY.   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   Renal cysts, acquired, bilateral 03/19/2013   several simple , CT   Urinary frequency    AND NOCTURIA   Past Surgical History:  Procedure Laterality Date   ABDOMINAL AORTOGRAM W/LOWER EXTREMITY N/A 09/21/2024   Procedure: ABDOMINAL AORTOGRAM W/LOWER EXTREMITY;  Surgeon: Serene Gaile ORN, MD;  [facility]: [facility];  Service: Cardiovascular;  Laterality: N/A;   APPLICATION OF WOUND VAC Right 09/27/2024   Procedure: APPLICATION, WOUND VAC RIGHT LOWER EXTREMITY;  Surgeon: Lanis Fonda BRAVO, MD;  Location: [facility] OR;  Service: Vascular;  Laterality: Right;   APPLICATION OF WOUND VAC Right 10/01/2024   Procedure: APPLICATION, WOUND VAC;  Surgeon: Lanis Fonda BRAVO, MD;  Location: [facility] OR;  Service: Vascular;  Laterality: Right;   APPLICATION, SKIN SUBSTITUTE Right 09/27/2024   Procedure: APPLICATION, MYRIAD SKIN SUBSTITUTE;  Surgeon: Lanis Fonda BRAVO, MD;  Location: [facility] OR;  Service: Vascular;  Laterality: Right;   APPLICATION, SKIN SUBSTITUTE Right 10/01/2024   Procedure: APPLICATION, SKIN SUBSTITUTE KERECIS 95SQ CM;  Surgeon: Lanis Fonda BRAVO, MD;  Location: [facility] OR;  Service: Vascular;  Laterality: Right;   AXILLARY-FEMORAL BYPASS GRAFT Right 09/24/2024   Procedure: CREATION, BYPASS, ARTERIAL, AXILLARY TO BILATERAL FEMORAL, USING PROPATEN X 80CM X 2 GRAFT;  Surgeon: Serene Gaile ORN, MD;  Location: [facility] OR;  Service: Vascular;  Laterality: Right;   BACK SURGERY     CARDIAC CATHETERIZATION  03/10/2018   CATARACT EXTRACTION W/ INTRAOCULAR LENS IMPLANT Left    ENDARTERECTOMY FEMORAL Bilateral 09/24/2024   Procedure: BILATERAL FEMORAL ENDARTERECTOMY;  Surgeon: Serene Gaile ORN, MD;  Location: [facility] OR;  Service: Vascular;  Laterality: Bilateral;   EYE SURGERY      RETINAL SURGERY LEFT EYE   FASCIOTOMY Right 09/24/2024   Procedure: RIGHT LOWER LEG FOUR COMPARTMENT FASCIOTOMY WITH RESECTION OF TIBIALIS ANTERIOR;  Surgeon: Serene Gaile ORN, MD;  Location: [facility];  Service: Vascular;  Laterality: Right;   FASCIOTOMY CLOSURE Right 09/27/2024   Procedure: RIGHT LOWER EXTREMITY FASCIOTOMY WASHOUT;  Surgeon: Lanis Fonda BRAVO, MD;  Location: [facility] OR;  Service: Vascular;  Laterality: Right;  RIGHT LEG CLOSURE   HYDROCELE EXCISION Left 03/05/2013   Procedure: HYDROCELECTOMY ADULT;  Surgeon: Donnice Gwenyth Brooks, MD;  Location: [facility];  Service: Urology;  Laterality: Left;   INCISION AND DRAINAGE OF DEEP ABSCESS, CALF Right 09/29/2024   Procedure: INCISION AND DRAINAGE OF DEEP ABSCESS, CALF;  Surgeon: Harden Jerona GAILS, MD;  Location: [facility];  Service: Orthopedics;  Laterality: Right;   INCISION AND DRAINAGE OF DEEP ABSCESS, CALF Right 10/06/2024   Procedure: INCISION AND DRAINAGE OF DEEP ABSCESS, CALF RIGHT;  Surgeon: Harden Jerona GAILS, MD;  Location: [facility] OR;  Service: Orthopedics;  Laterality: Right;  DEBRIDEMENT RIGHT LEG   INCISION AND DRAINAGE OF WOUND Right 10/01/2024   Procedure: IRRIGATION AND DEBRIDEMENT WOUND;  Surgeon: Lanis Fonda BRAVO, MD;  Location: [facility] OR;  Service: Vascular;  Laterality: Right;   INCISIONAL HERNIA REPAIR N/A 06/18/2022   Procedure: REPAIR OF INTERNAL HERNIA;  Surgeon: Rubin Calamity, MD;  Location: [facility] OR;  Service: General;  Laterality: N/A;   INGUINAL HERNIA REPAIR Bilateral AGE 63   INSERTION OF MESH N/A 06/11/2022   Procedure: INSERTION OF MESH;  Surgeon: Rubin Calamity, MD;  Location: [facility] OR;  Service: General;  Laterality: N/A;   INSERTION OF MESH N/A 06/18/2022   Procedure: INSERTION OF MESH;  Surgeon: Rubin Calamity, MD;  Location: [facility] OR;  Service: General;  Laterality: N/A;   KYPHOPLASTY N/A 05/08/2023   Procedure: THORACIC SEVEN KYPHOPLASTY;  Surgeon: Beuford Anes, MD;  Location: [facility];  Service: Orthopedics;   Laterality: N/A;   LAPAROSCOPY N/A 06/18/2022   Procedure: LAPAROSCOPY DIAGNOSTIC;  Surgeon: Rubin Calamity, MD;  Location: [facility] OR;  Service: General;  Laterality: N/A;   LOWER EXTREMITY ANGIOGRAM Right 09/24/2024   Procedure: RIGHT LOWER EXTREMITY ANGIOGRAM AND RIGHT POPLETEAL THROMBECTOMY;  Surgeon: Serene Gaile ORN, MD;  Location: [facility];  Service: Vascular;  Laterality: Right;   LYMPHADENECTOMY Bilateral 05/06/2013   Procedure: REDGIE;  Surgeon: Noretta Ferrara, MD;  Location: [facility];  Service: Urology;  Laterality: Bilateral;   PROSTATE BIOPSY N/A 03/05/2013   Procedure: PROSTATE BIOPSY and ultrasound;  Surgeon: Donnice Gwenyth Brooks, MD;  Location: [facility];  Service: Urology;  Laterality: N/A;   REMOVAL BURSA SAC, LEFT ELBOW  1996   ROBOT ASSISTED LAPAROSCOPIC RADICAL PROSTATECTOMY N/A 05/06/2013   Procedure: ROBOTIC ASSISTED LAPAROSCOPIC RADICAL PROSTATECTOMY LEVEL 3;  Surgeon: Noretta Ferrara, MD;  Location: [facility];  Service: Urology;  Laterality: N/A;   THROMBECTOMY FEMORAL ARTERY Bilateral 09/24/2024   Procedure: BILATERAL FEMORAL ARTERY THROMBECTOMY;  Surgeon: Serene Gaile ORN, MD;  Location: [facility];  Service: Vascular;  Laterality: Bilateral;   TONSILLECTOMY  as achild   TRANSFORAMINAL LUMBAR INTERBODY FUSION (TLIF) WITH PEDICLE SCREW FIXATION 1 LEVEL Left 09/08/2024   Procedure: LEFT-SIDED LUMBAR 4- LUMBAR 5 TRANSFORAMINAL LUMBAR INTERBODY FUSION AND DECOMPRESSION WITH INSTRUMENTATION AND ALLOGRAFT;  Surgeon: Beuford Anes, MD;  Location: [facility] OR;  Service: Orthopedics;  Laterality: Left;  LEFT-SIDED LUMBAR 4- LUMBAR 5 TRANSFORAMINAL LUMBAR INTERBODY FUSION AND DECOMPRESSION WITH INSTRUMENTATION AND ALLOGRAFT   XI ROBOTIC ASSISTED VENTRAL HERNIA N/A 06/11/2022   Procedure: ROBOTIC INCISIONAL HERNIA REPAIR WITH MESH;  Surgeon: Rubin Calamity, MD;  Location: [facility] OR;  Service: General;  Laterality: N/A;   Family History  Problem Relation Age of Onset   Cancer  Father        prostate cancer   Cancer Paternal Grandfather        prostate   Colon cancer Neg Hx    Colon polyps Neg Hx    Esophageal cancer Neg Hx    Rectal cancer Neg Hx    Stomach cancer Neg Hx    Social History:  reports that he quit smoking about 7 years ago. His smoking use included cigarettes. He started smoking about 54 years ago. He has a 94 pack-year smoking history. He has never used smokeless tobacco. He reports current alcohol use of about 14.0 standard drinks of alcohol per week. He reports that he does not use drugs. Allergies:  Allergies  Allergen Reactions   Ancef  [Cefazolin ] Anaphylaxis   Medications Prior to Admission  Medication Sig Dispense Refill   acetaminophen  (TYLENOL ) 325 MG tablet Take 1 tablet (325 mg total) by mouth every 4 (four) hours as needed for mild pain (pain score 1-3) or fever (or temp > 100.5).     albuterol  (PROVENTIL ) (2.5 MG/3ML) 0.083% nebulizer solution Inhale 3 mLs into the lungs every 6 (six) hours as needed for wheezing or shortness of breath.     ascorbic acid  (VITAMIN C ) 1000 MG tablet Take 1 tablet (1,000 mg total) by mouth daily.     aspirin  EC 81 MG tablet Take 1 tablet (81 mg total) by mouth daily. Swallow whole.     bisacodyl  (DULCOLAX) 5 MG EC tablet Take 1 tablet (5 mg total) by mouth daily as needed for moderate constipation.     cholecalciferol (VITAMIN D3) 25 MCG (1000 UNIT) tablet Take 1,000 Units by mouth daily.     citalopram  (CELEXA ) 20 MG tablet Take 1 tablet (20 mg total) by mouth daily.     docusate sodium  (COLACE) 100 MG capsule Take 1 capsule (100 mg total) by mouth 2 (two) times daily.     DULoxetine  (CYMBALTA ) 30 MG capsule Take 1 capsule (30 mg total) by mouth at bedtime.     heparin  25000 UT/250ML infusion Inject 1,500 Units/hr into the vein continuous.     lidocaine  (LIDODERM ) 5 % Place 2 patches onto the skin daily. Remove & Discard patch within 12 hours or as directed by MD     magnesium  gluconate (MAGONATE) 500  (27 Mg) MG TABS tablet Take 0.5 tablets (250 mg total) by mouth at bedtime.     methocarbamol  1000 MG TABS Take 1,000 mg by mouth 4 (four) times daily.     ondansetron  (ZOFRAN ) 4 MG tablet Take 1 tablet (4 mg total) by mouth every 6 (six) hours as needed for nausea or vomiting.     oxyCODONE  (OXYCONTIN ) 10 mg 12 hr tablet Take 1 tablet (10 mg total) by mouth every 12 (twelve) hours.     oxyCODONE  (ROXICODONE ) 15 MG immediate release  tablet Take 1 tablet (15 mg total) by mouth every 4 (four) hours as needed for severe pain (pain score 7-10).     Oxycodone  HCl 10 MG TABS Take 1 tablet (10 mg total) by mouth every 4 (four) hours as needed for moderate pain (pain score 4-6). 30 tablet 0   pravastatin  (PRAVACHOL ) 10 MG tablet Take 1 tablet (10 mg total) by mouth daily at 6 PM.     pregabalin  (LYRICA ) 150 MG capsule Take 1 capsule (150 mg total) by mouth 3 (three) times daily.     rosuvastatin  (CRESTOR ) 20 MG tablet Take 1 tablet (20 mg total) by mouth daily. 30 tablet 0   senna-docusate (SENOKOT-S) 8.6-50 MG tablet Take 1 tablet by mouth at bedtime as needed for mild constipation.     sodium chloride  1 g tablet Take 1 tablet (1 g total) by mouth 2 (two) times daily with a meal.     sorbitol  70 % SOLN Take 30 mLs by mouth as needed for moderate constipation (severe constipation).     tamsulosin  (FLOMAX ) 0.4 MG CAPS capsule Take 1 capsule (0.4 mg total) by mouth daily after supper.     zinc  sulfate, 50mg  elemental zinc , 220 (50 Zn) MG capsule Take 1 capsule (220 mg total) by mouth daily.     zolpidem  (AMBIEN ) 5 MG tablet Take 1 tablet (5 mg total) by mouth at bedtime as needed for sleep.        Home: Home Living Family/patient expects to be discharged to:: Private residence Living Arrangements: Spouse/significant other Available Help at Discharge: Family, Available 24 hours/day Type of Home: House Home Access: Stairs to enter [employer] of Steps: 4 Entrance Stairs-Rails: Can reach  both Home Layout: Two level Alternate Level Stairs-Number of Steps: pt does not access second level of home Bathroom Shower/Tub: Health visitor: Handicapped height Bathroom Accessibility: Yes Home Equipment: Agricultural consultant (2 wheels), Shower seat, Grab bars - tub/shower, Medical laboratory scientific officer - single point, Adaptive equipment, Hand held shower head, Other (comment) Adaptive Equipment: Reacher Additional Comments: bench in shower HH showerhead, grab bars in shower  Lives With: Spouse   Functional History: Prior Function Prior Level of Function : Independent/Modified Independent, Driving Mobility Comments: mod I with hurry cane at baseline - was walking ~61ft wtih RW and PT at AIR ADLs Comments: at baseline - needing min-mod A for ADLs at AIR  Functional Status:  Mobility: Bed Mobility Overal bed mobility: Needs Assistance Bed Mobility: Rolling, Sidelying to Sit Rolling: Supervision Sidelying to sit: Supervision Sit to sidelying: Supervision General bed mobility comments: S for safety, good recall of log roll technique Transfers Overall transfer level: Needs assistance Equipment used: Rolling walker (2 wheels) Transfers: Sit to/from Stand, Bed to chair/wheelchair/BSC Sit to Stand: Mod assist, +2 physical assistance, +2 safety/equipment, From elevated surface Bed to/from chair/wheelchair/BSC transfer type:: Step pivot Stand pivot transfers: Mod assist, +2 physical assistance, +2 safety/equipment Squat pivot transfers: Mod assist Step pivot transfers: Max assist, +2 physical assistance, +2 safety/equipment, From elevated surface Transfer via Lift Equipment: Stedy General transfer comment: Pt with good muscle to power up with min-mod +2 assist from air bed and chair surfaces, during step pivot pt with knee buckling, requiring max+2 assist to complete step pivot and bil knees blocked at times. Ambulation/Gait Ambulation/Gait assistance: Max assist, +2 physical assistance Gait  Distance (Feet): 4 Feet Assistive device: Rolling walker (2 wheels) Gait Pattern/deviations: Shuffle, Decreased dorsiflexion - right, Knee flexed in stance - right, Knee flexed in stance -  left, Knees buckling, Step-to pattern, Narrow base of support General Gait Details: Decreased bil knee extension in stance phase, pt needs AA for RLE while abducting for pivotal steps and R and at times L knee blocked due to pt c/f buckling/weakness, also assist for RW management and steadying assist. Stepping toward chair on his R side and posterior steps a couple feet. Stairs:  (not safe to attempt today 2/2 low BP/weakness)    ADL: ADL Overall ADL's : Needs assistance/impaired Eating/Feeding: Sitting, Set up Grooming: Set up, Sitting Upper Body Bathing: Set up, Sitting Lower Body Bathing: Maximal assistance, Bed level Upper Body Dressing : Minimal assistance Upper Body Dressing Details (indicate cue type and reason): donning back brace Lower Body Dressing: Maximal assistance, Bed level, +2 for safety/equipment Lower Body Dressing Details (indicate cue type and reason): To don brief with rolling Toilet Transfer: Maximal assistance, +2 for physical assistance, +2 for safety/equipment, Rolling walker (2 wheels), Stand-pivot Toilet Transfer Details (indicate cue type and reason): Stand, step pivot Toileting- Clothing Manipulation and Hygiene: Moderate assistance, +2 for safety/equipment Functional mobility during ADLs: Maximal assistance, +2 for physical assistance, +2 for safety/equipment, Rolling walker (2 wheels) General ADL Comments: Pt limited d/t soft BP  Cognition: Cognition Orientation Level: Oriented X4 Cognition Arousal: Lethargic Behavior During Therapy: Flat affect  Physical Exam: Blood pressure 125/87, pulse 99, temperature 98.7 F (37.1 C), temperature source Oral, resp. rate 13, height 5' 9 (1.753 m), weight 86.2 kg, SpO2 95%. Physical Exam Constitutional:      Appearance: He is  ill-appearing.     Comments: Slow to arouse  HENT:     Head: Normocephalic.     Nose: Nose normal.  Eyes:     Extraocular Movements: Extraocular movements intact.     Pupils: Pupils are equal, round, and reactive to light.  Cardiovascular:     Rate and Rhythm: Normal rate and regular rhythm.     Heart sounds: No murmur heard.    No gallop.  Pulmonary:     Effort: Pulmonary effort is normal. No respiratory distress.     Breath sounds: No wheezing.  Abdominal:     General: Bowel sounds are normal. There is no distension.     Tenderness: There is no abdominal tenderness.  Musculoskeletal:        General: Tenderness and deformity present. Normal range of motion.     Cervical back: Normal range of motion.  Skin:    General: Skin is warm.     Comments: Wound VAC in place  Neurological:     Comments: Patient is alert.  Complains of right leg pain.  Oriented x 3 and follows commands. CN exam non-focal. Fair insight and awareness. MMT: BUE 5/5. LLE limited by back pain but appears to be moving all muscle groups. RLE limited by wounds/pain. Sensory exam normal for light touch and pain except for right foot where there is diminished LT to dorsum and to a lesser extent plantar aspect of foot.  No limb ataxia or cerebellar signs. No abnormal tone appreciated.    Psychiatric:     Comments: Cooperative but anxious     Results for orders placed or performed during the hospital encounter of 09/24/24 (from the past 48 hours)  Heparin  level (unfractionated)     Status: Abnormal   Collection Time: 10/09/24  5:34 PM  Result Value Ref Range   Heparin  Unfractionated 0.25 (L) 0.30 - 0.70 IU/mL    Comment: (NOTE) The clinical reportable range upper limit is  being lowered to >1.10 to align with the FDA approved guidance for the current laboratory assay.  If heparin  results are below expected values, and patient dosage has  been confirmed, suggest follow up testing of antithrombin III  levels. Performed at [facility], [address], [place], [place] [address]   CBC     Status: Abnormal   Collection Time: 10/10/24  3:00 AM  Result Value Ref Range   WBC 12.0 (H) 4.0 - 10.5 K/uL   RBC 2.61 (L) 4.22 - 5.81 MIL/uL   Hemoglobin 7.7 (L) 13.0 - 17.0 g/dL   HCT 75.8 (L) 60.9 - 47.9 %   MCV 92.3 80.0 - 100.0 fL   MCH 29.5 26.0 - 34.0 pg   MCHC 32.0 30.0 - 36.0 g/dL   RDW 80.0 (H) 88.4 - 84.4 %   Platelets 162 150 - 400 K/uL   nRBC 0.0 0.0 - 0.2 %    Comment: Performed at [facility], [address] [place], [place] [address]  Heparin  level (unfractionated)     Status: Abnormal   Collection Time: 10/10/24  3:00 AM  Result Value Ref Range   Heparin  Unfractionated >1.10 (H) 0.30 - 0.70 IU/mL    Comment: (NOTE) The clinical reportable range upper limit is being lowered to >1.10 to align with the FDA approved guidance for the current laboratory assay.  If heparin  results are below expected values, and patient dosage has  been confirmed, suggest follow up testing of antithrombin III levels. Performed at [facility], [address], [place], [place] [address]   Heparin  level (unfractionated)     Status: Abnormal   Collection Time: 10/10/24 12:08 PM  Result Value Ref Range   Heparin  Unfractionated <0.10 (L) 0.30 - 0.70 IU/mL    Comment: (NOTE) The clinical reportable range upper limit is being lowered to >1.10 to align with the FDA approved guidance for the current laboratory assay.  If heparin  results are below expected values, and patient dosage has  been confirmed, suggest follow up testing of antithrombin III levels. Performed at [facility], [address], [place], [place] [address]   Heparin  level (unfractionated)     Status: Abnormal   Collection Time: 10/10/24  7:39 PM  Result Value Ref Range   Heparin  Unfractionated 0.13 (L) 0.30 - 0.70 IU/mL    Comment: (NOTE) The clinical reportable range upper limit is being lowered to  >1.10 to align with the FDA approved guidance for the current laboratory assay.  If heparin  results are below expected values, and patient dosage has  been confirmed, suggest follow up testing of antithrombin III levels. Performed at [facility] [facility], [address] [place], [place] [address]   CBC     Status: Abnormal   Collection Time: 10/11/24  8:36 AM  Result Value Ref Range   WBC 12.6 (H) 4.0 - 10.5 K/uL   RBC 3.01 (L) 4.22 - 5.81 MIL/uL   Hemoglobin 8.9 (L) 13.0 - 17.0 g/dL   HCT 72.1 (L) 60.9 - 47.9 %   MCV 92.4 80.0 - 100.0 fL   MCH 29.6 26.0 - 34.0 pg   MCHC 32.0 30.0 - 36.0 g/dL   RDW 81.3 (H) 88.4 - 84.4 %   Platelets 181 150 - 400 K/uL   nRBC 0.0 0.0 - 0.2 %    Comment: Performed at [facility], [address], [place], [place] [address]  Heparin  level (unfractionated)  Status: Abnormal   Collection Time: 10/11/24  8:36 AM  Result Value Ref Range   Heparin  Unfractionated 0.10 (L) 0.30 - 0.70 IU/mL    Comment: (NOTE) The clinical reportable range upper limit is being lowered to >1.10 to align with the FDA approved guidance for the current laboratory assay.  If heparin  results are below expected values, and patient dosage has  been confirmed, suggest follow up testing of antithrombin III levels. Performed at [facility], [address], [place], [place] [address]    No results found.    Blood pressure 125/87, pulse 99, temperature 98.7 F (37.1 C), temperature source Oral, resp. rate 13, height 5' 9 (1.753 m), weight 86.2 kg, SpO2 95%.  Medical Problem List and Plan: 1. Functional deficits secondary to lumbar radiculopathy/large disc herniation spinal stenosis.  Status post L4-5 transforaminal interbody fusion/posterior lateral fusion insertion interbody device L4-5 decompression 09/08/2024 per Dr. Beuford.  Back brace when out of bed.  -patient may not yet shower  -ELOS/Goals: 10-14 days, supervision goals 2.   Antithrombotics: -DVT/anticoagulation:  Pharmaceutical: Heparin  awaiting plan to possibly transition to Eliquis today 10/11/2024  -antiplatelet therapy: Aspirin  81 mg daily 3. Pain Management: Lyrica  150 mg 3 times daily, OxyContin  sustained-release 10 mg every 12 hours, oxycodone  10 mg every 4 hours as needed breakthrough pain Advil  400 mg every 4 hours as needed 4. Mood/Behavior/Sleep: Cymbalta  30 mg nightly, Celexa  20 mg daily  -antipsychotic agents: N/A 5. Neuropsych/cognition: This patient is capable of making decisions on his own behalf. 6. Skin/Wound Care: Routine skin checks  -vac management per [facility]OC RN and Dr. Harden  -local care as needed to other incision  -prevalon boot/pressure relief, right heel wound 7. Fluids/Electrolytes/Nutrition: Routine and analysis with follow-up chemistries 8.  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  Status post right leg anterior compartment fasciotomy 9/26 with multiple washouts as well as excisional debridement/wound VAC.  Patient is completing a course of doxycycline 9.  Acute blood loss anemia.  Follow-up CBC 10.  AKI.  Resolved after IV hydration. 11.  Hyperlipidemia.  Crestor  12.  COPD/remote tobacco use.  Monitor oxygen  saturations every shift 13.  History of prostate cancer/urinary retention.  History of status post prostatectomy.  External catheter currently in place.  Continue Flomax  0.4 mg daily 14.  History of detached retina left eye.  Follow-up outpatient ophthalmology.   Toribio PARAS Angiulli, PA-C 10/11/2024

## 2024-10-11 NOTE — H&P (Signed)
 Physical Medicine and Rehabilitation Admission H&P       HPI: David King is a 68 year old right handed male with history significant for anxiety, migraine headaches, detached retina left eye, hypertension, prostate cancer status post radical prostatectomy 2014, COPD/quit smoking 7 years ago as well as history of left common femoral enterectomy with bilateral common iliac artery stenting and left external iliac artery stenting November 2023 at [facility].  Per chart review patient lives with spouse.  1 level home 4 steps to entry.  Modified independent with a cane prior to admission.  Presented 09/08/2024 with low back pain radiating to the left lower extremity.  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  Underwent left-sided L4-5 transforaminal lumbar interbody fusion/posterior lateral fusion insertion of interbody device revision L4-5 decompression 09/08/2024 per Dr. Beuford.  Noted intraoperatively patient had developed hives swelling of tongue and lips along with hypotension with critical care medicine consulted possible anaphylactic shock and was started on epi drip and given Decadron .  He did require short-term intubation was transferred to the ICU and extubated 9/11.  MRI T-spine with no acute findings.  MRI lumbar spine showed L4-5 PLIF moderate residual foraminal stenosis.  Since his surgery patient had ongoing right leg weakness and numbness neurology consulted for recommendations.  MRI of the brain showed no acute changes.  Neurology felt these findings consistent with either hypersensitivity to pain or mild component of nonorganic findings.  Recommendations at that time were for outpatient EMG of right lower extremity numbness persisted.  Therapy evaluations completed with back brace when out of bed.  Therapy evaluations completed and patient was admitted to inpatient rehab services 09/15/2024 for comprehensive rehab therapies.   During patient's hospitalization noted persistent pain and numbness of the right lower extremity earlier hospital course workup by neurology was unremarkable at that time.  Vascular surgery Dr. Penne Colorado consulted 09/19/2024 for persistent leg pain.  ABIs were not able to be done due to patient's level of pain.  Monitoring of CK levels ranging from 3886-1434.  Diagnostic angiography completed 09/21/2024 per Dr. 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  CT angiography chest abdomen and pelvis completed showing stable infrarenal aortic aneurysm.  Due to patient's aortic occlusion findings he was discharged to acute care services 09/24/2024 and underwent right axillary bifemoral bypass graft with bilateral common femoral, superficial femoral and profundofemoral thrombectomy with redo of left common femoral artery exposure and 4 compartment right leg fasciotomy resection of necrotic muscle in the right anterior compartment with bilateral femoral enterectomy 09/24/2024 per Dr. Serene.  Patient did return to the OR same day 09/24/2024 due to popliteal signal being different than what it was initially in the operating room it appeared to be monophasic thus returned back for reexploration angiography to make sure blood flow is optimized and angiography revealed a filling defect in the popliteal artery at the level of the knee.  There was reconstitution of the posterior tibial artery which was dominant runoff.  Peroneal artery also opacified.  It was elected to reopen the medial right below-knee incision and dissected out the popliteal artery.  Patient tolerated the procedure well and was sent back to the ICU.  IV heparin  was initiated for arterial insufficiency.  Patient did require right calf lateral fasciotomy washout debridement of skin and soft tissue vacuum assisted dressing 09/27/2024 per vascular surgery Dr. Fonda Simpers followed by excisional debridement skin soft tissue muscle and fascia anterior compartment right  leg.  Transfer of the lateral compartment muscles to the anterior compartment to cover the anterior tibial vessels 09/29/2024 per Dr. Harden and ultimately with right leg fasciotomy washout again completed 10/01/2024 and later completed again with excisional debridement right calf with excision of soft tissue muscle and fascia application of wound VAC 10/06/2024 per Dr. Harden....  Patient did require episodes of ongoing hypotension 10/10 order of 1 unit packed red blood cells for hemoglobin of 7.2 latest hemoglobin 7.7.  Hospital course patient remains on doxycycline for wound coverage.  IV heparin  ongoing he was cleared to resume low-dose aspirin .  Therapy evaluations have been completed and ongoing with slow progressive gains.  Patient was readmitted back to inpatient rehab services for comprehensive therapies.   Review of Systems  Constitutional:  Negative for fever.  HENT:  Negative for hearing loss.   Eyes:        Visual impairment left eye  Respiratory:  Negative for cough, shortness of breath and wheezing.   Cardiovascular:  Positive for leg swelling. Negative for chest pain and palpitations.  Gastrointestinal:  Positive for constipation. Negative for nausea.  Genitourinary:  Positive for urgency.       Nocturia  Musculoskeletal:  Positive for back pain and myalgias.  Skin:  Negative for rash.  Neurological:  Positive for sensory change, weakness and headaches.  Psychiatric/Behavioral:  Positive for depression.        Anxiety  All other systems reviewed and are negative.      Past Medical History:  Diagnosis Date   Anxiety      new dx   Arthritis      lumbar   Atherosclerotic vascular disease     Cataract     COPD (chronic obstructive pulmonary disease) (HCC)     Diverticulosis     Elevated PSA     Headache(784.0)      MIGRAINES   Hypertension 2021   Iritis      CHRONIC IN LEFT EYE - SOME VISIAL IMPAIRMENT IN LEFT EYE   Neuromuscular disorder (HCC)      left leg/foot,pinched  siactic nerve   Pain      PAIN LEFT HIP AND DOWN LT LEG WITH NUMBNESS IN LEFT LEG--PT STATES SCIATIC NERVE IMPINGEMENT - PT PLANS BACK IN THE NEAR SURGERY.   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/05/2013    Adenocarcinoma   Renal cysts, acquired, bilateral 03/19/2013    several simple , CT   Urinary frequency      AND NOCTURIA             Past Surgical History:  Procedure Laterality Date   ABDOMINAL AORTOGRAM W/LOWER EXTREMITY N/A 09/21/2024    Procedure: ABDOMINAL AORTOGRAM W/LOWER EXTREMITY;  Surgeon: Serene Gaile ORN, MD;  Location: [facility];  Service: Cardiovascular;  Laterality: N/A;   APPLICATION OF WOUND VAC Right 09/27/2024    Procedure: APPLICATION, WOUND VAC RIGHT LOWER EXTREMITY;  Surgeon: Lanis Fonda BRAVO, MD;  Location: [facility] OR;  Service: Vascular;  Laterality: Right;   APPLICATION OF WOUND VAC Right 10/01/2024    Procedure: APPLICATION, WOUND VAC;  Surgeon: Lanis Fonda BRAVO, MD;  Location: [facility] OR;  Service: Vascular;  Laterality: Right;   APPLICATION, SKIN SUBSTITUTE Right 09/27/2024    Procedure: APPLICATION, MYRIAD SKIN SUBSTITUTE;  Surgeon: Lanis Fonda BRAVO, MD;  Location: [facility] OR;  Service: Vascular;  Laterality: Right;   APPLICATION, SKIN SUBSTITUTE Right 10/01/2024    Procedure: APPLICATION, SKIN SUBSTITUTE KERECIS 95SQ CM;  Surgeon: Lanis Fonda BRAVO, MD;  Location: [facility] OR;  Service: Vascular;  Laterality: Right;   AXILLARY-FEMORAL BYPASS GRAFT Right 09/24/2024    Procedure: CREATION, BYPASS, ARTERIAL, AXILLARY TO BILATERAL FEMORAL, USING PROPATEN X 80CM X 2 GRAFT;  Surgeon: Serene Gaile ORN, MD;  Location: [facility] OR;  Service: Vascular;  Laterality: Right;   BACK SURGERY       CARDIAC CATHETERIZATION   03/10/2018   CATARACT EXTRACTION W/ INTRAOCULAR LENS IMPLANT Left     ENDARTERECTOMY FEMORAL Bilateral 09/24/2024    Procedure: BILATERAL FEMORAL ENDARTERECTOMY;  Surgeon: Serene Gaile ORN, MD;  Location: [facility] OR;  Service: Vascular;  Laterality:  Bilateral;   EYE SURGERY        RETINAL SURGERY LEFT EYE   FASCIOTOMY Right 09/24/2024    Procedure: RIGHT LOWER LEG FOUR COMPARTMENT FASCIOTOMY WITH RESECTION OF TIBIALIS ANTERIOR;  Surgeon: Serene Gaile ORN, MD;  Location: [facility] OR;  Service: Vascular;  Laterality: Right;   FASCIOTOMY CLOSURE Right 09/27/2024    Procedure: RIGHT LOWER EXTREMITY FASCIOTOMY WASHOUT;  Surgeon: Lanis Fonda BRAVO, MD;  Location: [facility] OR;  Service: Vascular;  Laterality: Right;  RIGHT LEG CLOSURE   HYDROCELE EXCISION Left 03/05/2013    Procedure: HYDROCELECTOMY ADULT;  Surgeon: Donnice Gwenyth Brooks, MD;  Location: [facility];  Service: Urology;  Laterality: Left;   INCISION AND DRAINAGE OF DEEP ABSCESS, CALF Right 09/29/2024    Procedure: INCISION AND DRAINAGE OF DEEP ABSCESS, CALF;  Surgeon: Harden Jerona GAILS, MD;  Location: [facility] OR;  Service: Orthopedics;  Laterality: Right;   INCISION AND DRAINAGE OF DEEP ABSCESS, CALF Right 10/06/2024    Procedure: INCISION AND DRAINAGE OF DEEP ABSCESS, CALF RIGHT;  Surgeon: Harden Jerona GAILS, MD;  Location: [facility] OR;  Service: Orthopedics;  Laterality: Right;  DEBRIDEMENT RIGHT LEG   INCISION AND DRAINAGE OF WOUND Right 10/01/2024    Procedure: IRRIGATION AND DEBRIDEMENT WOUND;  Surgeon: Lanis Fonda BRAVO, MD;  Location: [facility] OR;  Service: Vascular;  Laterality: Right;   INCISIONAL HERNIA REPAIR N/A 06/18/2022    Procedure: REPAIR OF INTERNAL HERNIA;  Surgeon: Rubin Calamity, MD;  Location: [facility] OR;  Service: General;  Laterality: N/A;   INGUINAL HERNIA REPAIR Bilateral AGE 28   INSERTION OF MESH N/A 06/11/2022    Procedure: INSERTION OF MESH;  Surgeon: Rubin Calamity, MD;  Location: [facility] OR;  Service: General;  Laterality: N/A;   INSERTION OF MESH N/A 06/18/2022    Procedure: INSERTION OF MESH;  Surgeon: Rubin Calamity, MD;  Location: [facility] OR;  Service: General;  Laterality: N/A;   KYPHOPLASTY N/A 05/08/2023    Procedure: THORACIC SEVEN KYPHOPLASTY;  Surgeon: Beuford Anes, MD;   Location: [facility] OR;  Service: Orthopedics;  Laterality: N/A;   LAPAROSCOPY N/A 06/18/2022    Procedure: LAPAROSCOPY DIAGNOSTIC;  Surgeon: Rubin Calamity, MD;  Location: [facility] OR;  Service: General;  Laterality: N/A;   LOWER EXTREMITY ANGIOGRAM Right 09/24/2024    Procedure: RIGHT LOWER EXTREMITY ANGIOGRAM AND RIGHT POPLETEAL THROMBECTOMY;  Surgeon: Serene Gaile ORN, MD;  Location: [facility];  Service: Vascular;  Laterality: Right;   LYMPHADENECTOMY Bilateral 05/06/2013    Procedure: REDGIE;  Surgeon: Noretta Ferrara, MD;  Location: [facility] ORS;  Service: Urology;  Laterality: Bilateral;   PROSTATE BIOPSY N/A 03/05/2013    Procedure: PROSTATE BIOPSY and ultrasound;  Surgeon: Donnice Gwenyth Brooks, MD;  Location: [facility];  Service: Urology;  Laterality: N/A;   REMOVAL BURSA SAC, LEFT ELBOW   1996   ROBOT ASSISTED LAPAROSCOPIC  RADICAL PROSTATECTOMY N/A 05/06/2013    Procedure: ROBOTIC ASSISTED LAPAROSCOPIC RADICAL PROSTATECTOMY LEVEL 3;  Surgeon: Noretta Ferrara, MD;  Location: [facility] ORS;  Service: Urology;  Laterality: N/A;   THROMBECTOMY FEMORAL ARTERY Bilateral 09/24/2024    Procedure: BILATERAL FEMORAL ARTERY THROMBECTOMY;  Surgeon: Serene Gaile ORN, MD;  Location: [facility];  Service: Vascular;  Laterality: Bilateral;   TONSILLECTOMY        as achild   TRANSFORAMINAL LUMBAR INTERBODY FUSION (TLIF) WITH PEDICLE SCREW FIXATION 1 LEVEL Left 09/08/2024    Procedure: LEFT-SIDED LUMBAR 4- LUMBAR 5 TRANSFORAMINAL LUMBAR INTERBODY FUSION AND DECOMPRESSION WITH INSTRUMENTATION AND ALLOGRAFT;  Surgeon: Beuford Anes, MD;  Location: [facility] OR;  Service: Orthopedics;  Laterality: Left;  LEFT-SIDED LUMBAR 4- LUMBAR 5 TRANSFORAMINAL LUMBAR INTERBODY FUSION AND DECOMPRESSION WITH INSTRUMENTATION AND ALLOGRAFT   XI ROBOTIC ASSISTED VENTRAL HERNIA N/A 06/11/2022    Procedure: ROBOTIC INCISIONAL HERNIA REPAIR WITH MESH;  Surgeon: Rubin Calamity, MD;  Location: [facility] OR;  Service: General;  Laterality: N/A;              Family History  Problem Relation Age of Onset   Cancer Father          prostate cancer   Cancer Paternal Grandfather          prostate   Colon cancer Neg Hx     Colon polyps Neg Hx     Esophageal cancer Neg Hx     Rectal cancer Neg Hx     Stomach cancer Neg Hx          Social History:  reports that he quit smoking about 7 years ago. His smoking use included cigarettes. He started smoking about 54 years ago. He has a 94 pack-year smoking history. He has never used smokeless tobacco. He reports current alcohol use of about 14.0 standard drinks of alcohol per week. He reports that he does not use drugs. Allergies:  Allergies      Allergies  Allergen Reactions   Ancef  [Cefazolin ] Anaphylaxis            Medications Prior to Admission  Medication Sig Dispense Refill   acetaminophen  (TYLENOL ) 325 MG tablet Take 1 tablet (325 mg total) by mouth every 4 (four) hours as needed for mild pain (pain score 1-3) or fever (or temp > 100.5).       albuterol  (PROVENTIL ) (2.5 MG/3ML) 0.083% nebulizer solution Inhale 3 mLs into the lungs every 6 (six) hours as needed for wheezing or shortness of breath.       ascorbic acid  (VITAMIN C ) 1000 MG tablet Take 1 tablet (1,000 mg total) by mouth daily.       aspirin  EC 81 MG tablet Take 1 tablet (81 mg total) by mouth daily. Swallow whole.       bisacodyl  (DULCOLAX) 5 MG EC tablet Take 1 tablet (5 mg total) by mouth daily as needed for moderate constipation.       cholecalciferol (VITAMIN D3) 25 MCG (1000 UNIT) tablet Take 1,000 Units by mouth daily.       citalopram  (CELEXA ) 20 MG tablet Take 1 tablet (20 mg total) by mouth daily.       docusate sodium  (COLACE) 100 MG capsule Take 1 capsule (100 mg total) by mouth 2 (two) times daily.       DULoxetine  (CYMBALTA ) 30 MG capsule Take 1 capsule (30 mg total) by mouth at bedtime.       heparin  25000 UT/250ML infusion Inject 1,500 Units/hr into the  vein continuous.       lidocaine  (LIDODERM ) 5 % Place 2  patches onto the skin daily. Remove & Discard patch within 12 hours or as directed by MD       magnesium  gluconate (MAGONATE) 500 (27 Mg) MG TABS tablet Take 0.5 tablets (250 mg total) by mouth at bedtime.       methocarbamol  1000 MG TABS Take 1,000 mg by mouth 4 (four) times daily.       ondansetron  (ZOFRAN ) 4 MG tablet Take 1 tablet (4 mg total) by mouth every 6 (six) hours as needed for nausea or vomiting.       oxyCODONE  (OXYCONTIN ) 10 mg 12 hr tablet Take 1 tablet (10 mg total) by mouth every 12 (twelve) hours.       oxyCODONE  (ROXICODONE ) 15 MG immediate release tablet Take 1 tablet (15 mg total) by mouth every 4 (four) hours as needed for severe pain (pain score 7-10).       Oxycodone  HCl 10 MG TABS Take 1 tablet (10 mg total) by mouth every 4 (four) hours as needed for moderate pain (pain score 4-6). 30 tablet 0   pravastatin  (PRAVACHOL ) 10 MG tablet Take 1 tablet (10 mg total) by mouth daily at 6 PM.       pregabalin  (LYRICA ) 150 MG capsule Take 1 capsule (150 mg total) by mouth 3 (three) times daily.       rosuvastatin  (CRESTOR ) 20 MG tablet Take 1 tablet (20 mg total) by mouth daily. 30 tablet 0   senna-docusate (SENOKOT-S) 8.6-50 MG tablet Take 1 tablet by mouth at bedtime as needed for mild constipation.       sodium chloride  1 g tablet Take 1 tablet (1 g total) by mouth 2 (two) times daily with a meal.       sorbitol  70 % SOLN Take 30 mLs by mouth as needed for moderate constipation (severe constipation).       tamsulosin  (FLOMAX ) 0.4 MG CAPS capsule Take 1 capsule (0.4 mg total) by mouth daily after supper.       zinc  sulfate, 50mg  elemental zinc , 220 (50 Zn) MG capsule Take 1 capsule (220 mg total) by mouth daily.       zolpidem  (AMBIEN ) 5 MG tablet Take 1 tablet (5 mg total) by mouth at bedtime as needed for sleep.                  Home: Home Living Family/patient expects to be discharged to:: Private residence Living Arrangements: Spouse/significant other Available Help at  Discharge: Family, Available 24 hours/day Type of Home: House Home Access: Stairs to enter [employer] of Steps: 4 Entrance Stairs-Rails: Can reach both Home Layout: Two level Alternate Level Stairs-Number of Steps: pt does not access second level of home Bathroom Shower/Tub: Health visitor: Handicapped height Bathroom Accessibility: Yes Home Equipment: Agricultural consultant (2 wheels), Shower seat, Grab bars - tub/shower, Medical laboratory scientific officer - single point, Adaptive equipment, Hand held shower head, Other (comment) Adaptive Equipment: Reacher Additional Comments: bench in shower HH showerhead, grab bars in shower  Lives With: Spouse   Functional History: Prior Function Prior Level of Function : Independent/Modified Independent, Driving Mobility Comments: mod I with hurry cane at baseline - was walking ~59ft wtih RW and PT at AIR ADLs Comments: at baseline - needing min-mod A for ADLs at AIR   Functional Status:  Mobility: Bed Mobility Overal bed mobility: Needs Assistance Bed Mobility: Rolling, Sidelying to Sit Rolling: Supervision Sidelying to  sit: Supervision Sit to sidelying: Supervision General bed mobility comments: S for safety, good recall of log roll technique Transfers Overall transfer level: Needs assistance Equipment used: Rolling walker (2 wheels) Transfers: Sit to/from Stand, Bed to chair/wheelchair/BSC Sit to Stand: Mod assist, +2 physical assistance, +2 safety/equipment, From elevated surface Bed to/from chair/wheelchair/BSC transfer type:: Step pivot Stand pivot transfers: Mod assist, +2 physical assistance, +2 safety/equipment Squat pivot transfers: Mod assist Step pivot transfers: Max assist, +2 physical assistance, +2 safety/equipment, From elevated surface Transfer via Lift Equipment: Stedy General transfer comment: Pt with good muscle to power up with min-mod +2 assist from air bed and chair surfaces, during step pivot pt with knee buckling,  requiring max+2 assist to complete step pivot and bil knees blocked at times. Ambulation/Gait Ambulation/Gait assistance: Max assist, +2 physical assistance Gait Distance (Feet): 4 Feet Assistive device: Rolling walker (2 wheels) Gait Pattern/deviations: Shuffle, Decreased dorsiflexion - right, Knee flexed in stance - right, Knee flexed in stance - left, Knees buckling, Step-to pattern, Narrow base of support General Gait Details: Decreased bil knee extension in stance phase, pt needs AA for RLE while abducting for pivotal steps and R and at times L knee blocked due to pt c/f buckling/weakness, also assist for RW management and steadying assist. Stepping toward chair on his R side and posterior steps a couple feet. Stairs:  (not safe to attempt today 2/2 low BP/weakness)   ADL: ADL Overall ADL's : Needs assistance/impaired Eating/Feeding: Sitting, Set up Grooming: Set up, Sitting Upper Body Bathing: Set up, Sitting Lower Body Bathing: Maximal assistance, Bed level Upper Body Dressing : Minimal assistance Upper Body Dressing Details (indicate cue type and reason): donning back brace Lower Body Dressing: Maximal assistance, Bed level, +2 for safety/equipment Lower Body Dressing Details (indicate cue type and reason): To don brief with rolling Toilet Transfer: Maximal assistance, +2 for physical assistance, +2 for safety/equipment, Rolling walker (2 wheels), Stand-pivot Toilet Transfer Details (indicate cue type and reason): Stand, step pivot Toileting- Clothing Manipulation and Hygiene: Moderate assistance, +2 for safety/equipment Functional mobility during ADLs: Maximal assistance, +2 for physical assistance, +2 for safety/equipment, Rolling walker (2 wheels) General ADL Comments: Pt limited d/t soft BP   Cognition: Cognition Orientation Level: Oriented X4 Cognition Arousal: Lethargic Behavior During Therapy: Flat affect   Physical Exam: Blood pressure 125/87, pulse 99, temperature  98.7 F (37.1 C), temperature source Oral, resp. rate 13, height 5' 9 (1.753 m), weight 86.2 kg, SpO2 95%. Physical Exam Constitutional:      Appearance: He is ill-appearing.     Comments: Slow to arouse  HENT:     Head: Normocephalic.     Nose: Nose normal.  Eyes:     Extraocular Movements: Extraocular movements intact.     Pupils: Pupils are equal, round, and reactive to light.  Cardiovascular:     Rate and Rhythm: Normal rate and regular rhythm.     Heart sounds: No murmur heard.    No gallop.     Both feet warm to touch.  Pulmonary:     Effort: Pulmonary effort is normal. No respiratory distress.     Breath sounds: No wheezing.  Abdominal:     General: Bowel sounds are normal. There is no distension.     Tenderness: There is no abdominal tenderness.  Musculoskeletal:        General: Tenderness and deformity present. Normal range of motion.     Cervical back: Normal range of motion.  Skin:    General:  Skin is warm.     Multiple wounds as below                    Neurological:     Comments: Patient is alert.  Complains of right leg pain.  Oriented x 3 and follows commands. CN exam non-focal. Fair insight and awareness. MMT: BUE 5/5. LLE limited by  pain but appears to be moving all muscle groups. RLE limited by wounds/pain. Sensory exam normal for light touch and pain except for right foot where there is diminished LT along dorsum and to a lesser extent plantar aspect of foot.  No limb ataxia or cerebellar signs. No abnormal tone appreciated.    Psychiatric:     Comments: Cooperative but anxious       Lab Results Last 48 Hours        Results for orders placed or performed during the hospital encounter of 09/24/24 (from the past 48 hours)  Heparin  level (unfractionated)     Status: Abnormal    Collection Time: 10/09/24  5:34 PM  Result Value Ref Range    Heparin  Unfractionated 0.25 (L) 0.30 - 0.70 IU/mL      Comment: (NOTE) The clinical reportable  range upper limit is being lowered to >1.10 to align with the FDA approved guidance for the current laboratory assay.   If heparin  results are below expected values, and patient dosage has  been confirmed, suggest follow up testing of antithrombin III levels. Performed at [facility], [address], [place], [place] [address]    CBC     Status: Abnormal    Collection Time: 10/10/24  3:00 AM  Result Value Ref Range    WBC 12.0 (H) 4.0 - 10.5 K/uL    RBC 2.61 (L) 4.22 - 5.81 MIL/uL    Hemoglobin 7.7 (L) 13.0 - 17.0 g/dL    HCT 75.8 (L) 60.9 - 52.0 %    MCV 92.3 80.0 - 100.0 fL    MCH 29.5 26.0 - 34.0 pg    MCHC 32.0 30.0 - 36.0 g/dL    RDW 80.0 (H) 88.4 - 15.5 %    Platelets 162 150 - 400 K/uL    nRBC 0.0 0.0 - 0.2 %      Comment: Performed at [facility], [address] [place], [place] [address]  Heparin  level (unfractionated)     Status: Abnormal    Collection Time: 10/10/24  3:00 AM  Result Value Ref Range    Heparin  Unfractionated >1.10 (H) 0.30 - 0.70 IU/mL      Comment: (NOTE) The clinical reportable range upper limit is being lowered to >1.10 to align with the FDA approved guidance for the current laboratory assay.   If heparin  results are below expected values, and patient dosage has  been confirmed, suggest follow up testing of antithrombin III levels. Performed at [facility], [address] [place], [place] [address]    Heparin  level (unfractionated)     Status: Abnormal    Collection Time: 10/10/24 12:08 PM  Result Value Ref Range    Heparin  Unfractionated <0.10 (L) 0.30 - 0.70 IU/mL      Comment: (NOTE) The clinical reportable range upper limit is being lowered to >1.10 to align with the FDA approved guidance for the current laboratory assay.   If heparin  results are below expected values, and patient dosage has  been confirmed, suggest follow up testing of antithrombin III levels. Performed at  [facility], [address] [place], [place] [address]    Heparin  level (unfractionated)     Status: Abnormal    Collection Time: 10/10/24  7:39 PM  Result Value Ref Range    Heparin  Unfractionated 0.13 (L) 0.30 - 0.70 IU/mL      Comment: (NOTE) The clinical reportable range upper limit is being lowered to >1.10 to align with the FDA approved guidance for the current laboratory assay.   If heparin  results are below expected values, and patient dosage has  been confirmed, suggest follow up testing of antithrombin III levels. Performed at [facility], [address], [place], [place] [address]    CBC     Status: Abnormal    Collection Time: 10/11/24  8:36 AM  Result Value Ref Range    WBC 12.6 (H) 4.0 - 10.5 K/uL    RBC 3.01 (L) 4.22 - 5.81 MIL/uL    Hemoglobin 8.9 (L) 13.0 - 17.0 g/dL    HCT 72.1 (L) 60.9 - 52.0 %    MCV 92.4 80.0 - 100.0 fL    MCH 29.6 26.0 - 34.0 pg    MCHC 32.0 30.0 - 36.0 g/dL    RDW 81.3 (H) 88.4 - 15.5 %    Platelets 181 150 - 400 K/uL    nRBC 0.0 0.0 - 0.2 %      Comment: Performed at [facility], [address] [place], [place] [address]  Heparin  level (unfractionated)     Status: Abnormal    Collection Time: 10/11/24  8:36 AM  Result Value Ref Range    Heparin  Unfractionated 0.10 (L) 0.30 - 0.70 IU/mL      Comment: (NOTE) The clinical reportable range upper limit is being lowered to >1.10 to align with the FDA approved guidance for the current laboratory assay.   If heparin  results are below expected values, and patient dosage has  been confirmed, suggest follow up testing of antithrombin III levels. Performed at [facility], [address], [place], [place] [address]        Imaging Results (Last 48 hours)  No results found.         Blood pressure 125/87, pulse 99, temperature 98.7 F (37.1 C), temperature source Oral, resp. rate 13, height 5' 9 (1.753 m), weight 86.2 kg, SpO2 95%.   Medical Problem List and Plan: 1. Functional deficits  secondary to lumbar radiculopathy/large disc herniation spinal stenosis.  Status post L4-5 transforaminal interbody fusion/posterior lateral fusion insertion interbody device L4-5 decompression 09/08/2024 per Dr. Beuford.  Back brace when out of bed.             -patient may not yet shower             -ELOS/Goals: 10-14 days, supervision goals 2.  Antithrombotics: -DVT/anticoagulation:  Pharmaceutical: Heparin  awaiting plan to possibly transition to Eliquis today 10/11/2024             -antiplatelet therapy: Aspirin  81 mg daily 3. Pain Management: Lyrica  150 mg 3 times daily, OxyContin  sustained-release 10 mg every 12 hours, oxycodone  10 mg every 4 hours as needed breakthrough pain Advil  400 mg every 4 hours as needed 4. Mood/Behavior/Sleep: Cymbalta  30 mg nightly, Celexa  20 mg daily             -antipsychotic agents: N/A 5. Neuropsych/cognition: This patient is capable of making decisions on his own behalf. 6. Skin/Wound Care: Routine skin checks             -  vac management per [facility] RN and Dr. Harden             -local care and pressure relief as needed to multiple other wounds.             -prevalon boot/pressure relief, right heel wound 7. Fluids/Electrolytes/Nutrition: Routine and analysis with follow-up chemistries 8.  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  Status post right leg anterior compartment fasciotomy 9/26 with multiple washouts as well as excisional debridement/wound VAC.  Patient is completing a course of doxycycline 9.  Acute blood loss anemia.  Follow-up CBC 10.  AKI.  Resolved after IV hydration. 11.  Hyperlipidemia.  Crestor  12.  COPD/remote tobacco use.  Monitor oxygen  saturations every shift 13.  History of prostate cancer/urinary retention.  History of status post prostatectomy.  External catheter currently in place.  Continue Flomax  0.4 mg daily 14.  History of detached retina left eye.  Follow-up outpatient ophthalmology.     Toribio PARAS  Angiulli, PA-C 10/11/2024

## 2024-10-11 NOTE — Progress Notes (Signed)
Inpatient Rehab Admissions Coordinator:    I have a CIR bed for this Pt. RN may call report to [place], [place], [place] Admissions Coordinator  [phone] ([place]) [phone] (office)

## 2024-10-11 NOTE — Discharge Summary (Addendum)
 Physician Discharge Summary   Patient: David King MRN: [id number] DOB: 03-24-1956  Admit date:     09/24/2024  Discharge date: 10/11/24  Discharge Physician: Deliliah Room   PCP: [name], MD   Recommendations at discharge:    F/u with PCP in one week F/u with outpatient with Dr Harden in a week F/u with vascular and vein specialists. Call to make an appointment. 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 male with PMH significant for HTN, HLD, retinal detachment, prostate cancer, PAD s/p b/l common [place] artery stenting, chronic back pain, lumbar radiculopathy.Because of progressive radiculopathy pain, patient was following up with orthopedics as an outpatient.  He had previous L4-L5 disc decompression.  Recent MRI showed severe stenosis of the left L4-L5 due to large disc herniation.   -- 09/08/2024, patient underwent revision L4-L5 decompression, lumbar body fusion by Dr. Beuford.  During the procedure, patient had an anaphylactic reaction to an unknown agent and developed hives with some hypotension. He required short-term intubation and transferred to ICU, post recovery he was noted to have some improvement in his left leg pain, postop site looked okay but he developed some right lower extremity weakness.  Neurology was consulted.  MRI brain was unremarkable.  EMG showed nonspecific changes.  -- 9/17, discharged to CIR. 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 -- 9/26, patient was readmitted to acute care with a plan of revascularization.  Patient underwent right axillary femoral bypass graft, bilateral common femoral, superficial femoral and profundofemoral thrombectomy, right leg anterior compartment fasciotomy due to compartment syndrome, right femoral endarterectomy. Soon after coming out of the OR, he was found to have decrease in his right lower extremity pulses again. Seen by vascular surgery, taken to the OR again.  Underwent right leg angiogram, found to have thrombus in the popliteal artery, underwent successful thrombectomy. Postop, admitted to [facility] -- 9/29, underwent right calf lateral fasciotomy washout, skin debridement and wound VAC placement.  -09/28/2024 right calf lateral fasciotomy without any skin debridement and wound VAC placement - 09/29/2024 excisional debridement skin and soft tissue muscle and fascia anterior compartment of the right leg transfer of the lateral compartment muscles to the anterior compartment to cover the anterior tibial vessels.  Dr. Harden 10/01/2024: repeat debridement with vascular I&D and excisional debridement 10/8:Excisional debridement right calf with excision of skin soft tissue muscle and fascia. Local tissue transfer for wound closure 35 x 10 cm. Application Kerecis micro graft 95 cm x 2 and 38 cm x 1. Application of cleanse choice wound VAC sponges x 1 and peel in place wound VAC sponges x 2 Doppler to verify arterial competence 10/10: Hypotensive, Ordered 1 unit of PRBC  Acute/subacute multifocal thrombosis s/p thrombectomy Compartment syndrome s/p right leg anterior compartment fasciotomy H/o PAD 9/30 right calf lateral fasciotomy without any skin debridement and wound VAC placement  Continue aspirin  81 mg daily, statin.  Dced heparin  drip on 10/13 and transitioned to oral eliquis after discussion with vascular surgery. Vascular surgery following Per vascular note, bilateral lower extremities are well-perfused with brisk PT Doppler signals..  But right foot with no motor or sensory function.   09/29/2024 Dr. Harden S/P  Excisional debridement skin soft tissue muscle and fascia anterior compartment right leg.  Transfer of the lateral compartment muscles to the anterior compartment to cover the anterior tibial vessels.  Repeat debridement 10/01/2024 with vascular status post I&D and excisional debridement 10/8: Excisional debridement right calf with excision of skin soft tissue muscle and fascia. Local tissue transfer for wound closure 35 x 10 cm. Application Kerecis micro graft 95 cm x 2 and 38 cm x 1. Application of cleanse choice wound VAC sponges x 1 and peel in place wound VAC sponges x 2 Doppler to verify arterial competence He still does not have sensation in his right foot despite revascularization. Wound VAC was leaking on the morning of 10/13/2024 so Dr. Harden applied a piece of derma tack to reseal the VAC.   Lumbar radiculopathy S/p recent L4-L5 decompression/fusion -9/10 Dr Beuford Continue morphine  and OxyContin    Acute blood loss anemia- Received one unit of PRBC on 10/10.  No evidence of active bleeding. F/u H&H closely.   AKI Resolved with IV hydration.     Acute urinary retention Foul-smelling urine H/o prostate cancer he had Foley catheter inserted on admission.  This was removed on 27 September.  Since then he required multiple In-N-Out catheterization.  With routine reminders he is able to urinate without having a Foley in place.  Continue Flomax .   Hyponatremia Mild.  Continue to monitor.   Hyperlipidemia Crestor  daily   COPD Bronchodilators as needed   Diverticulosis with constipation  KUB done on 10/04/2024 showed no bowel obstruction.   Anxiety/Major moderate  depression Celexa  20 mg daily, Cymbalta  nightly, Ambien  nightly as needed   Pressure injury bilateral buttocks and right heel deep tissue pressure injury seen by wound care.   Severe malnutrition due to acute illness as evidenced by moderate muscle depletion mild fat depletion.  Dietary consulted continue Ensure mighty shake Juven twice daily multivitamin.   Disposition: Back to CIR      Consultants: Vascular, Orthopedics, WOC Procedures performed:  Right axillary femoral bypass graft, bilateral common femoral, superficial femoral and profundofemoral thrombectomy, right leg anterior compartment fasciotomy due to compartment syndrome, right femoral endarterectomy.  Right leg angiogram, found to have thrombus in the popliteal artery, underwent successful thrombectomy.  9/29:Right calf lateral fasciotomy washout, skin debridement and wound VAC placement.  9/30:Right calf lateral fasciotomy without any skin debridement excisional debridement skin and soft tissue muscle and fascia anterior compartment of the right leg transfer of the lateral compartment muscles to the anterior compartment to cover the anterior tibial vessels.  10/3:Repeat debridement with vascular I&D and excisional debridement  10/8:Excisional debridement right calf with excision of skin soft tissue muscle and fascia. Local tissue transfer for wound closure 35 x 10 cm. Application Kerecis micro graft 95 cm x 2 and 38 cm x 1. Application of cleanse choice wound VAC sponges x 1 and peel in place wound VAC sponges x 2   Disposition: CIR Diet recommendation:  Regular diet DISCHARGE MEDICATION: Allergies as of 10/11/2024       Reactions   Ancef  [cefazolin ] Anaphylaxis        Medication List     STOP taking these medications  heparin  25000 UT/250ML infusion   pravastatin  10 MG tablet Commonly known as: PRAVACHOL        TAKE these medications    acetaminophen  325 MG tablet Commonly known as: TYLENOL  Take  1 tablet (325 mg total) by mouth every 4 (four) hours as needed for mild pain (pain score 1-3) or fever (or temp > 100.5).   albuterol  (2.5 MG/3ML) 0.083% nebulizer solution Commonly known as: PROVENTIL  Inhale 3 mLs into the lungs every 6 (six) hours as needed for wheezing or shortness of breath.   apixaban 5 MG Tabs tablet Commonly known as: ELIQUIS Take 1 tablet (5 mg total) by mouth 2 (two) times daily.   ascorbic acid  500 MG tablet Commonly known as: VITAMIN C  Take 1 tablet (500 mg total) by mouth daily. Start taking on: October 12, 2024 What changed:  medication strength how much to take   aspirin  EC 81 MG tablet Take 1 tablet (81 mg total) by mouth daily. Swallow whole.   bisacodyl  5 MG EC tablet Commonly known as: DULCOLAX Take 1 tablet (5 mg total) by mouth daily as needed for moderate constipation.   budesonide -glycopyrrolate -formoterol  160-9-4.8 MCG/ACT Aero inhaler Commonly known as: BREZTRI  Inhale 2 puffs into the lungs 2 (two) times daily.   cholecalciferol 25 MCG (1000 UNIT) tablet Commonly known as: VITAMIN D3 Take 1,000 Units by mouth daily.   citalopram  20 MG tablet Commonly known as: CELEXA  Take 1 tablet (20 mg total) by mouth daily.   docusate sodium  100 MG capsule Commonly known as: COLACE Take 1 capsule (100 mg total) by mouth 2 (two) times daily.   doxycycline 100 MG tablet Commonly known as: VIBRA-TABS Take 1 tablet (100 mg total) by mouth every 12 (twelve) hours. Until 10/27/24   DULoxetine  30 MG capsule Commonly known as: CYMBALTA  Take 1 capsule (30 mg total) by mouth at bedtime.   feeding supplement Liqd Take 237 mLs by mouth 2 (two) times daily between meals.   nutrition supplement (JUVEN) Pack Take 1 packet by mouth 2 (two) times daily between meals.   lidocaine  5 % Commonly known as: LIDODERM  Place 2 patches onto the skin daily. Remove & Discard patch within 12 hours or as directed by MD   magnesium  gluconate 500 (27 Mg) MG Tabs  tablet Commonly known as: MAGONATE Take 0.5 tablets (250 mg total) by mouth at bedtime.   Methocarbamol  1000 MG Tabs Take 1,000 mg by mouth 4 (four) times daily.   mouth rinse Liqd solution 15 mLs by Mouth Rinse route as needed (for oral care).   multivitamin with minerals Tabs tablet Take 1 tablet by mouth daily. Start taking on: October 12, 2024   ondansetron  4 MG tablet Commonly known as: ZOFRAN  Take 1 tablet (4 mg total) by mouth every 6 (six) hours as needed for nausea or vomiting.   oxyCODONE  15 MG immediate release tablet Commonly known as: ROXICODONE  Take 1 tablet (15 mg total) by mouth every 4 (four) hours as needed for severe pain (pain score 7-10). What changed: Another medication with the same name was removed. Continue taking this medication, and follow the directions you see here.   oxyCODONE  10 mg 12 hr tablet Commonly known as: OXYCONTIN  Take 1 tablet (10 mg total) by mouth every 12 (twelve) hours. What changed: Another medication with the same name was removed. Continue taking this medication, and follow the directions you see here.   pregabalin  150 MG capsule Commonly known as: LYRICA  Take 1 capsule (150 mg total) by mouth  3 (three) times daily.   rosuvastatin  20 MG tablet Commonly known as: CRESTOR  Take 1 tablet (20 mg total) by mouth daily.   senna-docusate 8.6-50 MG tablet Commonly known as: Senokot-S Take 1 tablet by mouth at bedtime as needed for mild constipation.   sodium chloride  1 g tablet Take 1 tablet (1 g total) by mouth 2 (two) times daily with a meal.   sorbitol  70 % Soln Take 30 mLs by mouth as needed for moderate constipation (severe constipation).   tamsulosin  0.4 MG Caps capsule Commonly known as: FLOMAX  Take 1 capsule (0.4 mg total) by mouth daily after supper.   thiamine 100 MG tablet Commonly known as: Vitamin B-1 Take 1 tablet (100 mg total) by mouth daily. Start taking on: October 12, 2024   zinc  sulfate (50mg  elemental  zinc ) 220 (50 Zn) MG capsule Take 1 capsule (220 mg total) by mouth daily.   zolpidem  5 MG tablet Commonly known as: AMBIEN  Take 1 tablet (5 mg total) by mouth at bedtime as needed for sleep.        Follow-up Information     Harden Jerona GAILS, MD Follow up in 1 week(s).   Specialty: Orthopedic Surgery Contact information: [address] [place]  [place] [address] [phone]         [name], MD. Schedule an appointment as soon as possible for a visit in 1 week(s).   Specialty: Family Medicine Contact information: [address] [place] [address] [phone]         [facility]. Schedule an appointment as soon as possible for a visit in 1 week(s).   Contact information: [address] [place]  [address] [phone]               Discharge Exam: Filed Weights   10/09/24 0603 10/10/24 0445 10/11/24 0421  Weight: 89.8 kg 85.3 kg 86.2 kg   General exam: NAD, Respiratory system: Clear to auscultation. Respiratory effort normal. Cardiovascular system: S1 & S2 heard, RRR. No JVD, murmurs, rubs, gallops or clicks. No pedal edema. Gastrointestinal system: Abdomen is nondistended, soft and nontender. No organomegaly or masses felt. Normal bowel sounds heard. Central nervous system: Alert and oriented.  Extremities: RLE dressing and wound vac x 2 in place  Condition at discharge: good  The results of significant diagnostics from this hospitalization (including imaging, microbiology, ancillary and laboratory) are listed below for reference.   Imaging Studies: DG Lumbar Spine 2-3 Views Result Date: 10/08/2024 CLINICAL DATA:  Postoperative check EXAM: LUMBAR SPINE - 2-3 VIEW COMPARISON:  Lumbar localization image [id number]. MRI lumbar spine 09/13/2024 FINDINGS: Five lumbar type vertebral bodies. Postoperative changes with posterior rod and screw fixation at L4-5, intervertebral prosthesis at L4-5, and intervertebral  prosthesis with anterior screw fixation at L5-S1. aortoiliac vascular stents. Mild lumbar scoliosis convex towards the left. No anterior subluxation. Diffuse degenerative change throughout the lumbar spine with narrowed interspaces and endplate osteophyte formation. No vertebral compression deformities. Visualized sacrum appears intact. IMPRESSION: Degenerative and postoperative changes in the lumbar spine as described. No acute displaced fractures are identified. Electronically Signed   By: Elsie Gravely M.D.   On: 10/08/2024 20:19   DG Chest Port 1 View Result Date: 10/06/2024 CLINICAL DATA:  Hypoxia.  COPD. EXAM: PORTABLE CHEST 1 VIEW COMPARISON:  09/30/2024 FINDINGS: Normal sized heart. Interval mild-to-moderate right basilar atelectasis and minimal left basilar atelectasis. Stable mild diffuse peribronchial thickening. Tortuous and partially calcified thoracic aorta. Stable midthoracic vertebral compression deformity with kyphoplasty  material. IMPRESSION: 1. Interval mild-to-moderate right basilar atelectasis and minimal left basilar atelectasis. 2. Stable mild chronic bronchitic changes. Electronically Signed   By: Elspeth Bathe M.D.   On: 10/06/2024 12:21   DG Abd 1 View Result Date: 10/04/2024 CLINICAL DATA:  Abdominal pain. EXAM: ABDOMEN - 1 VIEW COMPARISON:  CT 09/23/2019 FINDINGS: No bowel dilatation or evidence of obstruction. Moderate volume of stool in the colon. Iliac vascular stents in place. Left renal calculi are obscured by overlying bowel gas. Lumbar fusion hardware. IMPRESSION: No bowel obstruction. Moderate colonic stool burden. Electronically Signed   By: Andrea Gasman M.D.   On: 10/04/2024 19:13   DG CHEST PORT 1 VIEW Result Date: 09/30/2024 CLINICAL DATA:  Shortness of breath for the past 2 days.  Ex-smoker. EXAM: PORTABLE CHEST 1 VIEW COMPARISON:  09/08/2024 FINDINGS: The endotracheal tube has been removed. Normal-sized heart. Tortuous and partially calcified thoracic aorta.  Clear lungs with normal vascularity. Stable mild chronic peribronchial thickening. Stable midthoracic vertebral compression deformity and kyphoplasty material. IMPRESSION: 1. No acute abnormality. 2. Stable mild chronic bronchitic changes. Electronically Signed   By: Elspeth Bathe M.D.   On: 09/30/2024 12:58   HYBRID OR IMAGING ([facility] ONLY) Result Date: 09/24/2024 There is no interpretation for this exam.  This order is for images obtained during a surgical procedure.  Please See Surgeries Tab for more information regarding the procedure.   HYBRID OR IMAGING ([facility] ONLY) Result Date: 09/24/2024 There is no interpretation for this exam.  This order is for images obtained during a surgical procedure.  Please See Surgeries Tab for more information regarding the procedure.   CT Angio Chest/Abd/Pel for Dissection W and/or W/WO Result Date: 09/22/2024 CLINICAL DATA:  Aortic aneurysm suspected EXAM: CT ANGIOGRAPHY CHEST, ABDOMEN AND PELVIS TECHNIQUE: Non-contrast CT of the chest was initially obtained. Multidetector CT imaging through the chest, abdomen and pelvis was performed using the standard protocol during bolus administration of intravenous contrast. Multiplanar reconstructed images and MIPs were obtained and reviewed to evaluate the vascular anatomy. RADIATION DOSE REDUCTION: This exam was performed according to the departmental dose-optimization program which includes automated exposure control, adjustment of the mA and/or kV according to patient size and/or use of iterative reconstruction technique. CONTRAST:  75mL OMNIPAQUE  IOHEXOL  350 MG/ML SOLN COMPARISON:  Chest CT February 25, 2024 CT abdomen November 19, 2022 FINDINGS: CTA CHEST FINDINGS Cardiovascular: The heart size is normal. Ascending aorta measures 3.5 cm. Pulmonary artery is normal. Atherosclerotic calcifications of coronary arteries and aorta. No pericardial fluid. Mediastinum/Nodes: No suspicious lymphadenopathy. Hiatal hernia. Normal thyroid.  Lungs/Pleura: Upper lobe predominant centrilobular emphysematous changes. Lower lobe subpleural fibrotic changes and interlobular septal thickening. 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 Partially calcified left upper lobe micronodule (7/17), stable to prior. No new nodules. Peri osteophyte fibrosis in posteromedial right lower lobe. (7/85). No pleural effusion. Musculoskeletal: Degenerative changes of the spine. Review of the MIP images confirms the above findings. CTA ABDOMEN AND PELVIS FINDINGS VASCULAR Aorta: Severe atherosclerotic calcifications. Infrarenal aneurysmal dilation of aorta measuring 3.2 x 3.7 x 6.8 cm with mural thrombus, stable to prior. Celiac: Patent without evidence of aneurysm, dissection, vasculitis or significant stenosis. SMA: Patent without evidence of aneurysm, dissection, vasculitis or significant stenosis. Renals: Both renal arteries are patent without evidence of aneurysm, dissection, vasculitis, fibromuscular dysplasia or significant stenosis. IMA: Patent without evidence of aneurysm, dissection, vasculitis or significant stenosis.Aorto bi-iliac stent graft identified. Inflow: Patent without evidence of dissection, vasculitis or significant stenosis. Veins: No  obvious venous abnormality within the limitations of this arterial phase study. Review of the MIP images confirms the above findings. NON-VASCULAR Hepatobiliary: Nodular liver contour. Mild hepatic steatosis. No focal liver lesion. Gallbladder is unremarkable. No biliary dilatation. Pancreas: Unremarkable. No pancreatic ductal dilatation or surrounding inflammatory changes. Spleen: Scattered calcifications of the spleen suggestive of prior granulomatous disease. Adrenals/Urinary Tract: Multiple bilateral renal cortical nonenhancing lesions most consistent with simple cysts which does not require imaging follow-up. Few of the renal cortical lesions demonstrate calcification versus enhancing  component in right inferolateral cortex (5/124) Nonobstructive nephrolithiasis measuring 1.7 cm in left kidney interpolar region, stable. Previously seen left renal pelvis nonobstructive nephrolithiasis is resolved. Bladder is decompressed and contains Foley catheter balloon and foci of air likely due to instrumentation. Stomach/Bowel: Diverticulosis without diverticulitis. Stomach is unremarkable. No bowel obstruction. Normal appendix. Lymphatic: No suspicious lymphadenopathy. Reproductive: Status post prostatectomy. Other: No abdominal wall hernia or abnormality. No abdominopelvic ascites. Musculoskeletal: No fracture is seen. Spinal fusion device in lower lumbar spine. Status post vertebroplasty of T7 vertebral body. Review of the MIP images confirms the above findings. IMPRESSION: Stable infrarenal aortic aneurysm.  Aorto bi-iliac stent graft. Cirrhotic liver morphology.  No focal liver lesion identified. 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 Left apical partially calcified micro nodules, stable to prior. No new nodules. No imaging follow-up required in a low risk patient. Prostatectomy without suspicious finding at the surgical bed. Electronically Signed   By: Megan  Zare M.D.   On: 09/22/2024 18:54   VAS US  LOWER EXTREMITY ARTERIAL DUPLEX Result Date: 09/21/2024 LOWER EXTREMITY ARTERIAL DUPLEX STUDY Patient Name:  David King  Date of Exam:   09/20/2024 Medical Rec #: [id number]      Accession #:    [id number] Date of Birth: 03-Jul-1956      Patient Gender: M Patient Age:   68 years Exam Location:  [facility] Procedure:      VAS US  LOWER EXTREMITY ARTERIAL DUPLEX Referring Phys: [facility] --------------------------------------------------------------------------------  Indications: Rest pain, and peripheral artery disease. High Risk Factors: Hypertension, past history of smoking, coronary artery                    disease. Other Factors: PAD.   Vascular Interventions: Hx of bilateral CIA stenting, left EIA and CFA stenting                         at [facility] 02/26/2022. Current ABI:            unable to obtain Comparison Study: Previous arterial duplex on 07/05/2024 at [facility] was limited,                   however CFA, SFA, and PFA showed triphasic waveforms. RLE ABI                   on 05/05/2024 was 1.08 Performing Technologist: Ezzie Potters RVT, RDMS  Examination Guidelines: A complete evaluation includes B-mode imaging, spectral Doppler, color Doppler, and power Doppler as needed of all accessible portions of each vessel. Bilateral testing is considered an integral part of a complete examination. Limited examinations for reoccurring indications may be performed as noted.  +-----------+--------+-----+--------+-------------------+--------+ RIGHT      PSV cm/sRatioStenosisWaveform           Comments +-----------+--------+-----+--------+-------------------+--------+ CIA Prox   49  dampened monophasic         +-----------+--------+-----+--------+-------------------+--------+ EIA Mid    19                   dampened monophasic         +-----------+--------+-----+--------+-------------------+--------+ CFA Mid    29                   dampened monophasic         +-----------+--------+-----+--------+-------------------+--------+ DFA        44                                               +-----------+--------+-----+--------+-------------------+--------+ SFA Prox   17                   dampened monophasic         +-----------+--------+-----+--------+-------------------+--------+ SFA Mid    16                   dampened monophasic         +-----------+--------+-----+--------+-------------------+--------+ SFA Distal              occluded                            +-----------+--------+-----+--------+-------------------+--------+ POP Prox                occluded                             +-----------+--------+-----+--------+-------------------+--------+ POP Mid                 occluded                            +-----------+--------+-----+--------+-------------------+--------+ POP Distal 10                   dampened monophasic         +-----------+--------+-----+--------+-------------------+--------+ TP Trunk   6                    dampened monophasic         +-----------+--------+-----+--------+-------------------+--------+ ATA Prox   6                    dampened monophasic         +-----------+--------+-----+--------+-------------------+--------+ ATA Mid    4                    dampened monophasic         +-----------+--------+-----+--------+-------------------+--------+ ATA Distal              occluded                            +-----------+--------+-----+--------+-------------------+--------+ PTA Prox   4                    dampened monophasic         +-----------+--------+-----+--------+-------------------+--------+ PTA Mid                 occluded                            +-----------+--------+-----+--------+-------------------+--------+  PTA Distal              occluded                            +-----------+--------+-----+--------+-------------------+--------+ PERO Prox               occluded                            +-----------+--------+-----+--------+-------------------+--------+ PERO Mid                occluded                            +-----------+--------+-----+--------+-------------------+--------+ PERO Distal             occluded                            +-----------+--------+-----+--------+-------------------+--------+ DP                      occluded                            +-----------+--------+-----+--------+-------------------+--------+  Summary: Right: Dampened monophasic waveforms of common iliac artery suggestive of more proximal disease. Unable to visualize aorta due to  patient movement and discomfort. Total occlusion noted in the distal superficial femoral artery. Total occlusion noted in the proximal and mid popliteal artery (reconstitutes in distal popliteal). Total occlusion noted in the distal anterior tibial artery. Total occlusion noted in the peroneal artery. Total occlusion noted in the dorsal pedis artery. Arterial wall calcifications seen throughout lower extremity.  See table(s) above for measurements and observations. Electronically signed by Debby Robertson on 09/21/2024 at 5:12:33 PM.    Final    PERIPHERAL VASCULAR CATHETERIZATION Result Date: 09/21/2024 Images from the original result were not included. Patient name: David King MRN: [id number] DOB: Apr 20, 1956 Sex: male 09/21/2024 Pre-operative Diagnosis: Right leg ischemia Post-operative diagnosis:  Same Surgeon:  [name] Procedure Performed:  1.  Ultrasound-guided access, left common femoral artery  2.  Selective injection with catheter in the common iliac artery  3.  Ultrasound-guided access, left brachial artery  4.  Catheter placement into abdominal aorta  5.  Abdominal aortogram  6.  Bilateral leg angiogram  7.  Conscious sedation, 15 minutes Indications: This is a 68 year old gentleman with history of vascular surgery with endarterectomy and iliac stenting and back surgery from a anterior and posterior approach.  He has been complaining of right leg pain.  He is here for further evaluation Procedure:  The patient was identified in the holding area and taken to room 8.  The patient was then placed supine on the table and prepped and draped in the usual sterile fashion.  A time out was called.  Conscious sedation was administered with the use of IV fentanyl  and Versed  under continuous physician and nurse monitoring.  Heart rate, blood pressure, and oxygen  saturation were continuously monitored.  Total sedation time was 50 minutes.  Ultrasound was used to evaluate the left common femoral artery.  It was  patent, however there did appear to be thrombus within the common femoral artery.  A digital ultrasound image was acquired.  A micropuncture needle was used to access the left common femoral artery under ultrasound guidance.  An 018 wire was advanced without resistance and a micropuncture sheath was placed.  I then inserted a Bentson wire but met resistance.  I removed the micropuncture sheath and used a Berenstein 2 catheter.  I was confident the wire was in the iliac stents on the left but was concerned that I may be in a dissection within the aorta.  I shot a contrast injection that showed that there was opacification of his stents.  I then shot an aortogram to look like I was in a dissection.  Because the patient did not have palpable femoral pulses I elected to stop.  I held pressure for 10 minutes. Attention was turned towards the left arm.  The left brachial artery was evaluated with ultrasound which was widely patent.  1% lidocaine  was used local anesthesia.  The left brachial artery was then cannulated under ultrasound guidance with a micropuncture needle.  An 018 wire was inserted followed by placement of micropuncture sheath.  I then placed a 5 French sheath over a Bentson wire.  Heparin  and nitroglycerin  were given through the sheath.  I then advanced a pigtail catheter into the abdominal aorta and aortogram with bilateral runoff was performed.  Aortogram: The suprarenal abdominal aorta is patent.  The visualized portions of the superior mesenteric artery and bilateral renal arteries are widely patent.  There is occlusion of the infrarenal abdominal aorta at the level of the renal arteries.  There does appear to be reconstitution of the common femoral arteries bilaterally.  Right Lower Extremity: The right common femoral profundofemoral artery appeared to be patent.  The superficial femoral artery is patent down to the adductor canal where it occludes.  Distal evaluation was not possible given proximal  obstruction  Left Lower Extremity: The left common femoral profundofemoral artery appeared to be patent.  The superficial femoral artery is patent down to the adductor canal where it occludes.  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 [name], M.D., [facility] [place] Office: [phone] Pager:  [phone]   VAS US  LOWER EXTREMITY VENOUS (DVT) Result Date: 09/16/2024  Lower Venous DVT Study Patient Name:  David King  Date of Exam:   09/16/2024 Medical Rec #: [id number]      Accession #:    [id number] Date of Birth: March 12, 1956      Patient Gender: M Patient Age:   17 years Exam Location:  [facility] Procedure:      VAS US  LOWER EXTREMITY VENOUS (DVT) Referring Phys: TORIBIO PITCH --------------------------------------------------------------------------------  Indications: Swelling, Edema, and Pain.  Comparison Study: No prior exam. Performing Technologist: Edilia Elden Appl  Examination Guidelines: A complete evaluation includes B-mode imaging, spectral Doppler, color Doppler, and power Doppler as needed of all accessible portions of each vessel. Bilateral testing is considered an integral part of a complete examination. Limited examinations for reoccurring indications may be performed as noted. The reflux portion of the exam is performed with the patient in reverse Trendelenburg.  +---------+---------------+---------+-----------+----------+--------------+ RIGHT    CompressibilityPhasicitySpontaneityPropertiesThrombus Aging +---------+---------------+---------+-----------+----------+--------------+ CFV      Full           Yes      Yes                                 +---------+---------------+---------+-----------+----------+--------------+  SFJ       Full           Yes      Yes                                 +---------+---------------+---------+-----------+----------+--------------+ FV Prox  Full                                                        +---------+---------------+---------+-----------+----------+--------------+ FV Mid   Full                                                        +---------+---------------+---------+-----------+----------+--------------+ FV DistalFull                                                        +---------+---------------+---------+-----------+----------+--------------+ PFV      Full                                                        +---------+---------------+---------+-----------+----------+--------------+ POP      Full           Yes      Yes                                 +---------+---------------+---------+-----------+----------+--------------+ PTV      Full                                                        +---------+---------------+---------+-----------+----------+--------------+ PERO     Full                                                        +---------+---------------+---------+-----------+----------+--------------+   +---------+---------------+---------+-----------+----------+--------------+ LEFT     CompressibilityPhasicitySpontaneityPropertiesThrombus Aging +---------+---------------+---------+-----------+----------+--------------+ CFV      Full           Yes      Yes                                 +---------+---------------+---------+-----------+----------+--------------+ SFJ      Full           Yes      Yes                                 +---------+---------------+---------+-----------+----------+--------------+  FV Prox  Full                                                        +---------+---------------+---------+-----------+----------+--------------+ FV Mid   Full                                                         +---------+---------------+---------+-----------+----------+--------------+ FV DistalFull                                                        +---------+---------------+---------+-----------+----------+--------------+ PFV      Full                                                        +---------+---------------+---------+-----------+----------+--------------+ POP      Full           Yes      Yes                                 +---------+---------------+---------+-----------+----------+--------------+ PTV      Full                                                        +---------+---------------+---------+-----------+----------+--------------+ PERO     Full                                                        +---------+---------------+---------+-----------+----------+--------------+     Summary: BILATERAL: - No evidence of deep vein thrombosis seen in the lower extremities, bilaterally. -No evidence of popliteal cyst, bilaterally.   *See table(s) above for measurements and observations. Electronically signed by Debby Robertson on 09/16/2024 at 4:04:02 PM.    Final    MR BRAIN WO CONTRAST Result Date: 09/14/2024 CLINICAL DATA:  Right leg weakness and numbness EXAM: MRI HEAD WITHOUT CONTRAST TECHNIQUE: Multiplanar, multiecho pulse sequences of the brain and surrounding structures were obtained without intravenous contrast. COMPARISON:  None Available. FINDINGS: MRI brain: The brain volume is normal. The signal in the brain parenchyma is normal. There is no acute or chronic infarct. The ventricles are normal. No mass lesion. There are normal flow signals in the carotid arteries and basilar artery. No significant bone marrow signal abnormality. Incidental left mastoid effusion. IMPRESSION: No acute infarct or other significant abnormality Electronically Signed   By: Nancyann Burns M.D.   On: 09/14/2024 10:33   MR Lumbar Spine W Wo Contrast Result Date:  09/14/2024 CLINICAL DATA:  Low back pain, prior surgery, new symptoms EXAM: MRI LUMBAR SPINE WITHOUT AND WITH CONTRAST TECHNIQUE: Multiplanar and multiecho pulse sequences of the lumbar spine were obtained without and with intravenous contrast. CONTRAST:  9mL GADAVIST  GADOBUTROL  1 MMOL/ML IV SOLN COMPARISON:  None Available. FINDINGS: Segmentation:  Standard. Alignment:  Normal. Vertebrae: No fracture, evidence of discitis, or bone lesion. L5-S1 interbody fusion. L4-L5 PLIF. Conus medullaris and cauda equina: Conus extends to the L1-L2 level. Conus and cauda equina appear normal. Paraspinal and other soft tissues: Partially imaged left renal cyst. Disc levels: T12-L1: No significant disc protrusion, foraminal stenosis, or canal stenosis. L1-L2: No significant disc protrusion, foraminal stenosis, or canal stenosis. L2-L3: Right eccentric disc bulging. Mild right foraminal stenosis. Mild right subarticular recess stenosis without significant central canal stenosis. L3-L4: Disc bulging, facet arthropathy and ligamentum thickening. Resulting mild canal stenosis and mild bilateral subarticular recess stenosis. Moderate left and mild right foraminal stenosis. L4-L5: PLIF with metallic artifact that limits assessment but there appears to be improved left foraminal stenosis with some enhancing granulation tissue in the left foramen and probably at least moderate foraminal stenosis. Right foramen and canal are patent. L5-S1: Interbody fusion. Endplate spurring contributes to moderate left and mild right foraminal stenosis. Patent canal. IMPRESSION: 1. At L4-L5,PLIF with metallic artifact that limits assessment but there appears to be improved left foraminal stenosis with probably at least moderate residual foraminal stenosis. 2. At L5-S1, similar moderate left and mild right foraminal stenosis. Electronically Signed   By: Gilmore GORMAN Molt M.D.   On: 09/14/2024 00:52   MR THORACIC SPINE WO CONTRAST Result Date:  09/13/2024 EXAM: MRI THORACIC SPINE WITHOUT INTRAVENOUS CONTRAST 09/13/2024 01:54:07 PM TECHNIQUE: Multiplanar multisequence MRI of the thoracic spine was performed without the administration of intravenous contrast. COMPARISON: None available. CLINICAL HISTORY: Mid-back pain. FINDINGS: BONES AND ALIGNMENT: There is straightening of the normal thoracic kyphosis. Chronic compression deformity of T7 with up to 50% height loss in the anterior and central aspect of the vertebral body with findings suggestive of kyphoplasty. There is mild anterior wedging of the T11 and T12 vertebral bodies without edema, which may be physiologic versus sequelae of mild trauma. Chronic compression deformity. The vertebral body heights are otherwise maintained. There is no evidence of acute fracture in the thoracic spine. SPINAL CORD: Normal spinal cord volume. No cord compression or cord signal abnormality. SOFT TISSUES: Unremarkable. DEGENERATIVE CHANGES: There are degenerative changes at the costovertebral articulations at multiple levels. There is no evidence of large disc herniation or high-grade spinal canal stenosis in the thoracic spine. There is facet arthrosis at multiple levels in the thoracic spine. Mild foraminal stenosis on the left at T6-7. Moderate right and mild left foraminal stenosis at T10-11. IMPRESSION: 1. No acute findings. 2. 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 3. Mild anterior wedging of the T11 and T12 vertebral bodies without edema, which may be physiologic versus sequelae of mild chronic compression deformity. 4. Degenerative changes as above. Mild foraminal stenosis on the left at T6-7 and moderate right and mild left foraminal stenosis at T10-11. Electronically signed by: Donnice Mania MD 09/13/2024 02:39 PM EDT RP Workstation: [id number]    Microbiology: Results for orders placed or performed during the hospital encounter of 09/24/24  MRSA Next Gen by  PCR, Nasal     Status: None   Collection Time: 10/05/24  7:36 PM   Specimen: Nasal Mucosa; Nasal Swab  Result Value Ref Range  Status   MRSA by PCR Next Gen NOT DETECTED NOT DETECTED Final    Comment: (NOTE) The GeneXpert MRSA Assay (FDA approved for NASAL specimens only), is one component of a comprehensive MRSA colonization surveillance program. It is not intended to diagnose MRSA infection nor to guide or monitor treatment for MRSA infections. Test performance is not FDA approved in patients less than 27 years old. Performed at [facility], [address], [place], [place] [address]   Aerobic/Anaerobic Culture w Gram Stain (surgical/deep wound)     Status: None (Preliminary result)   Collection Time: 10/06/24 10:23 AM   Specimen: Soft Tissue, Other  Result Value Ref Range Status   Specimen Description TISSUE  Final   Special Requests FASCIA RIGHT LEG  Final   Gram Stain NO WBC SEEN NO ORGANISMS SEEN   Final   Culture   Final    NO GROWTH 5 DAYS NO ANAEROBES ISOLATED; CULTURE IN PROGRESS FOR 5 DAYS Performed at [facility], [address] [place], [place] [address]    Report Status PENDING  Incomplete  Culture, blood (Routine X 2) w Reflex to ID Panel     Status: None (Preliminary result)   Collection Time: 10/07/24 10:06 PM   Specimen: BLOOD  Result Value Ref Range Status   Specimen Description BLOOD SITE NOT SPECIFIED  Final   Special Requests   Final    BOTTLES DRAWN AEROBIC AND ANAEROBIC Blood Culture adequate volume   Culture   Final    NO GROWTH 4 DAYS Performed at [facility], [address], [place], [place] [address]    Report Status PENDING  Incomplete  Culture, blood (Routine X 2) w Reflex to ID Panel     Status: None (Preliminary result)   Collection Time: 10/07/24 10:09 PM   Specimen: BLOOD  Result Value Ref Range Status   Specimen Description BLOOD SITE NOT SPECIFIED  Final   Special Requests   Final    BOTTLES DRAWN AEROBIC AND  ANAEROBIC Blood Culture adequate volume   Culture   Final    NO GROWTH 4 DAYS Performed at [facility], [address] [place], [place] [address]    Report Status PENDING  Incomplete    Labs: CBC: Recent Labs  Lab 10/07/24 0313 10/07/24 2206 10/08/24 0353 10/09/24 0410 10/10/24 0300 10/11/24 0836  WBC 14.6* 18.5* 14.3* 12.0* 12.0* 12.6*  NEUTROABS 11.3*  --  10.2*  --   --   --   HGB 9.2* 8.0* 7.2* 7.7* 7.7* 8.9*  HCT 28.3* 25.5* 22.5* 23.6* 24.1* 27.8*  MCV 95.3 98.1 96.6 92.5 92.3 92.4  PLT 266 256 202 161 162 181   Basic Metabolic Panel: Recent Labs  Lab 10/07/24 2206 10/08/24 0353  NA 134* 132*  K 4.5 3.8  CL 96* 99  CO2 27 22  GLUCOSE 122* 102*  BUN 35* 28*  CREATININE 1.49* 1.11  CALCIUM  8.7* 7.7*  MG 2.2  --   PHOS 3.3  --    Liver Function Tests: No results for input(s): AST, ALT, ALKPHOS, BILITOT, PROT, ALBUMIN  in the last 168 hours. CBG: No results for input(s): GLUCAP in the last 168 hours.  Discharge time spent: 50 minutes.  Signed: Deliliah Room, MD [facility] 10/11/2024

## 2024-10-11 NOTE — Progress Notes (Addendum)
 PROGRESS NOTE    David King  FMW:[id number] DOB: [birth year] DOA: 09/24/2024 PCP: [name], MD   Brief Narrative:    68 y.o. male with PMH significant for HTN, HLD, retinal detachment, prostate cancer, PAD s/p b/l common [place] artery stenting, chronic back pain, lumbar radiculopathy.Because of progressive radiculopathy pain, patient was following up with orthopedics as an outpatient.  He had previous L4-L5 disc decompression.  Recent MRI showed severe stenosis of the left L4-L5 due to large disc herniation.   -- 09/08/2024, patient underwent revision L4-L5 decompression, lumbar body fusion by Dr. Beuford.  During the procedure, patient had an anaphylactic reaction to an unknown agent and developed hives with some hypotension. He required short-term intubation and transferred to ICU, post recovery he was noted to have some improvement in his left leg pain, postop site looked okay but he developed some right lower extremity weakness.  Neurology was consulted.  MRI brain was unremarkable.  EMG showed nonspecific changes.  -- 9/17, discharged to CIR. 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 -- 9/26, patient was readmitted to acute care with a plan of revascularization.  Patient underwent right axillary femoral bypass graft, bilateral common femoral, superficial femoral and profundofemoral thrombectomy, right leg anterior compartment fasciotomy due to compartment syndrome, right femoral endarterectomy. Soon after coming out of the OR, he was found to have decrease in his right lower extremity pulses again. Seen by vascular surgery, taken to the OR again.  Underwent right leg angiogram, found to have thrombus in the popliteal artery, underwent successful thrombectomy. Postop, admitted  to [facility] -- 9/29, underwent right calf lateral fasciotomy washout, skin debridement and wound VAC placement.  -09/28/2024 right calf lateral fasciotomy without any skin debridement and wound VAC placement - 09/29/2024 excisional debridement skin and soft tissue muscle and fascia anterior compartment of the right leg transfer of the lateral compartment muscles to the anterior compartment to cover the anterior tibial vessels.  Dr. Harden 10/01/2024: repeat debridement with vascular I&D and excisional debridement 10/8:Excisional debridement right calf with excision of skin soft tissue muscle and fascia. Local tissue transfer for wound closure 35 x 10 cm. Application Kerecis micro graft 95 cm x 2 and 38 cm x 1. Application of cleanse choice wound VAC sponges x 1 and peel in place wound VAC sponges x 2 Doppler to verify arterial competence 10/10: Hypotensive, Ordered 1 unit of PRBC  Pending insurance auth to go back to CIR.  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 Dced heparin  drip on 10/13 and transitioned to oral eliquis. Vascular surgery following Per vascular note, bilateral lower extremities are well-perfused with brisk PT Doppler signals..  But right foot with no motor or sensory function.  09/29/2024 Dr. Harden S/P   Excisional debridement skin soft tissue muscle and fascia anterior compartment right leg.  Transfer of the lateral compartment muscles to the anterior compartment to cover the anterior tibial vessels.  Repeat debridement 10/01/2024 with vascular status post I&D and excisional debridement 10/8: Excisional debridement right calf with excision of skin soft tissue muscle and fascia. Local tissue transfer for wound closure 35 x 10 cm. Application Kerecis micro graft 95 cm x 2 and 38 cm x 1. Application of cleanse choice wound VAC sponges x 1 and peel in place wound VAC sponges x 2 Doppler to verify arterial competence He still does not have sensation in his right foot despite revascularization. Wound VAC was leaking on the morning of 10/13/2024 so Dr. Harden applied a piece of derma tack to reseal the VAC.   Lumbar radiculopathy S/p recent L4-L5 decompression/fusion -9/10 Dr Beuford Continue morphine  and OxyContin    Acute blood loss anemia- Received one unit of PRBC on 10/10.  No evidence of active bleeding. F/u H&H closely.  AKI Resolved with IV hydration.     Acute urinary retention Foul-smelling urine H/o prostate cancer he had Foley catheter inserted on admission.  This was removed on 27 September.  Since then he required multiple In-N-Out catheterization.  With routine reminders he is able to urinate without having a Foley in place.  Continue Flomax .   Hyponatremia Mild.  Continue to monitor.   Hyperlipidemia Crestor  20 mg daily   COPD Bronchodilators as needed   Diverticulosis with constipation  KUB done on 10/04/2024 showed no bowel obstruction.   Anxiety/Major moderate depression Celexa  20 mg daily, Cymbalta  nightly, Ambien  nightly as needed   Pressure injury bilateral buttocks and right heel deep tissue pressure injury seen by wound care.   Severe malnutrition due to acute illness as evidenced by moderate muscle depletion mild fat depletion.  Dietary consulted continue  Ensure mighty shake Juven twice daily multivitamin.  Disposition: Back to CIR, awaiting insurance auth  DVT prophylaxis: on IV Heparin  drip     Code Status: Full Code Family Communication:  None at the bedside Status is: Inpatient Remains inpatient appropriate because: LE debridements  Subjective:  No acute issues overnight.  He appears slightly confused.  Not on nasal oxygen  this morning.  He said that his wife does visit him everyday.  He still complains of right leg pain but is manageable.  Examination:  General exam: NAD, appears slightly confused Respiratory system: Clear to auscultation. Respiratory effort normal. Cardiovascular system: S1 & S2 heard, RRR. No JVD, murmurs, rubs, gallops or clicks. No pedal edema. Gastrointestinal system: Abdomen is nondistended, soft and nontender. No organomegaly or masses felt. Normal bowel sounds heard. Central nervous system: Alert and oriented.  Extremities: RLE dressing and wound vac x 2 in place   Wound 10/05/24 0815 Pressure Injury Buttocks Right;Left Stage 1 -  Intact skin with non-blanchable redness of a localized area usually over a bony prominence. (Active)     Diet Orders (From admission, onward)     Start     Ordered   10/06/24 1535  Diet regular Room service appropriate? Yes with Assist; Fluid consistency: Thin  Diet effective now       Question Answer Comment  Room service appropriate? Yes with Assist   Fluid consistency: Thin      10/06/24 1534            Objective: Vitals:   10/11/24 0644 10/11/24 0807 10/11/24 0846 10/11/24 0854  BP: ROLLEN)  140/128  125/87   Pulse: 95  99   Resp: (!) 22  13   Temp: 98.2 F (36.8 C) 98.7 F (37.1 C) 98.7 F (37.1 C)   TempSrc: Oral Oral Oral   SpO2: 94%  96% 95%  Weight:      Height:        Intake/Output Summary (Last 24 hours) at 10/11/2024 9061 Last data filed at 10/11/2024 9350 Gross per 24 hour  Intake --  Output 1625 ml  Net -1625 ml   Filed Weights    10/09/24 0603 10/10/24 0445 10/11/24 0421  Weight: 89.8 kg 85.3 kg 86.2 kg    Scheduled Meds:  ascorbic acid   500 mg Oral Daily   aspirin  EC  81 mg Oral Daily   budesonide -glycopyrrolate -formoterol   2 puff Inhalation BID   citalopram   20 mg Oral Daily   doxycycline  100 mg Oral Q12H   DULoxetine   30 mg Oral QHS   feeding supplement  237 mL Oral BID BM   multivitamin with minerals  1 tablet Oral Daily   nutrition supplement (JUVEN)  1 packet Oral BID BM   oxyCODONE   10 mg Oral Q12H   polyethylene glycol  17 g Oral Daily   pregabalin   150 mg Oral TID   rosuvastatin   20 mg Oral Daily   senna-docusate  1 tablet Oral QHS   tamsulosin   0.4 mg Oral QPC supper   thiamine  100 mg Oral Daily   zinc  sulfate (50mg  elemental zinc )  220 mg Oral Daily   Continuous Infusions:  heparin  1,500 Units/hr (10/10/24 2045)    Nutritional status Signs/Symptoms: moderate muscle depletion, mild fat depletion, energy intake < or equal to 50% for > or equal to 5 days Interventions: Refer to RD note for recommendations Body mass index is 28.06 kg/m.  Data Reviewed:   CBC: Recent Labs  Lab 10/07/24 0313 10/07/24 2206 10/08/24 0353 10/09/24 0410 10/10/24 0300  WBC 14.6* 18.5* 14.3* 12.0* 12.0*  NEUTROABS 11.3*  --  10.2*  --   --   HGB 9.2* 8.0* 7.2* 7.7* 7.7*  HCT 28.3* 25.5* 22.5* 23.6* 24.1*  MCV 95.3 98.1 96.6 92.5 92.3  PLT 266 256 202 161 162   Basic Metabolic Panel: Recent Labs  Lab 10/07/24 2206 10/08/24 0353  NA 134* 132*  K 4.5 3.8  CL 96* 99  CO2 27 22  GLUCOSE 122* 102*  BUN 35* 28*  CREATININE 1.49* 1.11  CALCIUM  8.7* 7.7*  MG 2.2  --   PHOS 3.3  --    GFR: Estimated Creatinine Clearance: 67.4 mL/min (by C-G formula based on SCr of 1.11 mg/dL). Liver Function Tests: No results for input(s): AST, ALT, ALKPHOS, BILITOT, PROT, ALBUMIN  in the last 168 hours.  No results for input(s): LIPASE, AMYLASE in the last 168 hours. No results for input(s):  AMMONIA in the last 168 hours. Coagulation Profile: No results for input(s): INR, PROTIME in the last 168 hours. Cardiac Enzymes: Recent Labs  Lab 10/07/24 2206  CKTOTAL 69   BNP (last 3 results) No results for input(s): PROBNP in the last 8760 hours. HbA1C: No results for input(s): HGBA1C in the last 72 hours. CBG: No results for input(s): GLUCAP in the last 168 hours.  Lipid Profile: No results for input(s): CHOL, HDL, LDLCALC, TRIG, CHOLHDL, LDLDIRECT in the last 72 hours. Thyroid Function Tests: No results for input(s): TSH, T4TOTAL, FREET4, T3FREE, THYROIDAB in the last 72 hours. Anemia Panel: No results for input(s): VITAMINB12,  FOLATE, FERRITIN, TIBC, IRON, RETICCTPCT in the last 72 hours. Sepsis Labs: Recent Labs  Lab 10/07/24 2206 10/08/24 0045  LATICACIDVEN 3.5* 1.2    Recent Results (from the past 240 hours)  MRSA Next Gen by PCR, Nasal     Status: None   Collection Time: 10/05/24  7:36 PM   Specimen: Nasal Mucosa; Nasal Swab  Result Value Ref Range Status   MRSA by PCR Next Gen NOT DETECTED NOT DETECTED Final    Comment: (NOTE) The GeneXpert MRSA Assay (FDA approved for NASAL specimens only), is one component of a comprehensive MRSA colonization surveillance program. It is not intended to diagnose MRSA infection nor to guide or monitor treatment for MRSA infections. Test performance is not FDA approved in patients less than 26 years old. Performed at [facility], [address] [place], [place] [address]   Aerobic/Anaerobic Culture w Gram Stain (surgical/deep wound)     Status: None (Preliminary result)   Collection Time: 10/06/24 10:23 AM   Specimen: Soft Tissue, Other  Result Value Ref Range Status   Specimen Description TISSUE  Final   Special Requests FASCIA RIGHT LEG  Final   Gram Stain NO WBC SEEN NO ORGANISMS SEEN   Final   Culture   Final    NO GROWTH 4 DAYS NO ANAEROBES ISOLATED; CULTURE  IN PROGRESS FOR 5 DAYS Performed at [facility], [address], [place], [place] [address]    Report Status PENDING  Incomplete  Culture, blood (Routine X 2) w Reflex to ID Panel     Status: None (Preliminary result)   Collection Time: 10/07/24 10:06 PM   Specimen: BLOOD  Result Value Ref Range Status   Specimen Description BLOOD SITE NOT SPECIFIED  Final   Special Requests   Final    BOTTLES DRAWN AEROBIC AND ANAEROBIC Blood Culture adequate volume   Culture   Final    NO GROWTH 4 DAYS Performed at [facility], [address], [place], [place] [address]    Report Status PENDING  Incomplete  Culture, blood (Routine X 2) w Reflex to ID Panel     Status: None (Preliminary result)   Collection Time: 10/07/24 10:09 PM   Specimen: BLOOD  Result Value Ref Range Status   Specimen Description BLOOD SITE NOT SPECIFIED  Final   Special Requests   Final    BOTTLES DRAWN AEROBIC AND ANAEROBIC Blood Culture adequate volume   Culture   Final    NO GROWTH 4 DAYS Performed at [facility], [address], [place], [place] [address]    Report Status PENDING  Incomplete         Radiology Studies: No results found.      LOS: 17 days   Time spent= 39 mins    Deliliah Room, MD [facility]  If 7PM-7AM, please contact night-coverage  10/11/2024, 9:38 AM

## 2024-10-11 NOTE — Progress Notes (Incomplete)
 Occupational Therapy Treatment Patient Details Name: David King MRN: [id number] DOB: 07-Dec-1956 Today's Date: 10/11/2024   History of present illness 68 y.o. 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 Pt went to OR x2 on 9/26 initially for R axial bypass, bilateral femoral thrombectomy and RLE fasciotomy and later for redo RLE thrombectomy. Pt recently underwent L4-5 decompression and fusion on 9/10 with anaphylactic reaction during surgery requiring ICU stay.  09/29/2024 excisional debridement skin and soft tissue muscle and fascia anterior compartment of the right leg transfer of the lateral compartment muscles to the anterior compartment to cover the anterior tibial vessels.  Dr. Harden  10/02/2019 5 repeat debridement with vascular I&D and excisional debridement  S/p serial debridement again on October 8. PMHx: COPD, CAD, PAD, tobacco use, lumbar radiculopathy, detached retina L eye   OT comments  ***      If plan is discharge home, recommend the following:  Assistance with cooking/housework;Assist for transportation;Two people to help with walking and/or transfers;Two people to help with bathing/dressing/bathroom   Equipment Recommendations  Other (comment) (defer)    Recommendations for Other Services Rehab consult    Precautions / Restrictions Precautions Precautions: Fall;Back;Other (comment) Precaution Booklet Issued: Yes (comment) Recall of Precautions/Restrictions: Intact Precaution/Restrictions Comments: watch BP (orthostatic); RLE foot drop Required Braces or Orthoses: Spinal Brace Spinal Brace: Thoracolumbosacral orthotic;Applied in sitting position Restrictions Weight Bearing Restrictions Per Provider Order: Yes RLE Weight Bearing Per Provider Order: Weight bearing as tolerated Other Position/Activity Restrictions: transfers only on RLE        Mobility Bed Mobility Overal bed mobility: Needs Assistance Bed Mobility: Rolling, Sidelying to Sit Rolling: Supervision Sidelying to sit: Contact guard assist            Transfers Overall transfer level: Needs assistance Equipment used: Ambulation equipment used Transfers: Sit to/from Stand, Bed to chair/wheelchair/BSC Sit to Stand: Max assist, +2 physical assistance             Transfer via Lift Equipment: Stedy   Balance Overall balance assessment: Needs assistance Sitting-balance support: Feet supported, No upper extremity supported Sitting balance-Leahy Scale: Fair Sitting balance - Comments: L rail   Standing balance support: Bilateral upper extremity supported, Reliant on assistive device for balance Standing balance-Leahy Scale: Poor Standing balance comment: Reliant on RW and external support                           ADL either performed or assessed with clinical judgement   ADL Overall ADL's : Needs assistance/impaired                         Toilet Transfer: Maximal assistance;+2 for physical assistance Toilet Transfer Details (indicate cue type and reason): standing in stedy frame         Functional mobility during ADLs: Maximal assistance;+2 for physical assistance      Extremity/Trunk Assessment Upper Extremity Assessment Upper Extremity Assessment: Generalized weakness   Lower Extremity Assessment Lower Extremity Assessment: Defer to PT evaluation        Vision   Additional Comments: detached retina L eye   Perception Perception Perception: Not tested   Praxis Praxis Praxis: Not tested   Communication Communication Communication: No apparent difficulties   Cognition Arousal: Alert Behavior During Therapy: WFL for tasks assessed/performed Cognition: Cognition impaired  Memory impairment (select all impairments): Short-term memory Attention impairment (select first level of impairment): Sustained  attention Executive functioning impairment (select all impairments): Problem solving OT - Cognition Comments: pt thinking he is on bedpan upon entry, needing to have BM, no bedpan present                 Following commands: Intact Following commands impaired: Only follows one step commands consistently      Cueing   Cueing Techniques: Verbal cues, Tactile cues, Gestural cues  Exercises      Shoulder Instructions       General Comments see BP in vitals flowsheet    Pertinent Vitals/ Pain       Pain Assessment Pain Assessment: Faces Pain Score: 7  Faces Pain Scale: Hurts whole lot Pain Location: RLE Pain Descriptors / Indicators: Discomfort, Grimacing, Moaning, Guarding Pain Intervention(s): Limited activity within patient's tolerance, Monitored during session, Repositioned  Home Living                                          Prior Functioning/Environment              Frequency  Min 2X/week        Progress Toward Goals  OT Goals(current goals can now be found in the care plan section)  Progress towards OT goals: Progressing toward goals  Acute Rehab OT Goals Patient Stated Goal: to go to reheab OT Goal Formulation: With patient Time For Goal Achievement: 10/14/24 Potential to Achieve Goals: Good ADL Goals Pt Will Perform Lower Body Bathing: sitting/lateral leans;with set-up;with adaptive equipment Pt Will Perform Lower Body Dressing: with mod assist;sit to/from stand Pt Will Transfer to Toilet: with mod assist;stand pivot transfer;bedside commode Pt/caregiver will Perform Home Exercise Program: Increased ROM;Both right and left upper extremity;Increased strength;With written HEP provided Additional ADL Goal #1: Pt will sit EOB independently for 3 meals/day as a precursor to OOB ADLs  Plan      Co-evaluation    PT/OT/SLP Co-Evaluation/Treatment: Yes Reason for Co-Treatment: Complexity of the patient's impairments (multi-system  involvement);Necessary to address cognition/behavior during functional activity;For patient/therapist safety;To address functional/ADL transfers PT goals addressed during session: Mobility/safety with mobility;Balance;Proper use of DME;Strengthening/ROM OT goals addressed during session: ADL's and self-care      AM-PAC OT 6 Clicks Daily Activity     Outcome Measure   Help from another person eating meals?: A Little Help from another person taking care of personal grooming?: A Little Help from another person toileting, which includes using toliet, bedpan, or urinal?: Total Help from another person bathing (including washing, rinsing, drying)?: A Lot Help from another person to put on and taking off regular upper body clothing?: A Little Help from another person to put on and taking off regular lower body clothing?: A Lot 6 Click Score: 14    End of Session Equipment Utilized During Treatment: Gait belt;Rolling walker (2 wheels);Back brace;Oxygen   OT Visit Diagnosis: Unsteadiness on feet (R26.81);Other abnormalities of gait and mobility (R26.89);Muscle weakness (generalized) (M62.81);Pain Pain - Right/Left: Right Pain - part of body: Ankle and joints of foot   Activity Tolerance Patient tolerated treatment well   Patient Left in bed;with call bell/phone within reach;with bed alarm set   Nurse Communication Mobility status;Precautions        Time: 8693-8650 OT Time Calculation (min): 43 min  Charges: OT General Charges $OT Visit: 1  Visit OT Treatments $Self Care/Home Management : 8-22 mins $Therapeutic Activity: 8-22 mins  {enter signature dotphrase here}  Boris Engelmann K Koonce 10/11/2024, 4:26 PM

## 2024-10-11 NOTE — Progress Notes (Signed)
 Patient ID: David King, male   DOB: 1956/08/26, 68 y.o.   MRN: [id number] Wound VAC was leaking this morning.  I applied a piece of derma tack to resealed the VAC.  Will consult wound care nursing to remove the to peel and place dressings and apply a new single peel and place dressing on top of the cleanse choice wound VAC sponge.  Total of 250 cc in the wound VAC canister no new drainage

## 2024-10-11 NOTE — Progress Notes (Signed)
 Occupational Therapy Treatment Patient Details Name: David King MRN: [id number] DOB: 04-06-1956 Today's Date: 10/11/2024   History of present illness 68 y.o. 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 Pt went to OR x2 on 9/26 initially for R axial bypass, bilateral femoral thrombectomy and RLE fasciotomy and later for redo RLE thrombectomy. Pt recently underwent L4-5 decompression and fusion on 9/10 with anaphylactic reaction during surgery requiring ICU stay.  09/29/2024 excisional debridement skin and soft tissue muscle and fascia anterior compartment of the right leg transfer of the lateral compartment muscles to the anterior compartment to cover the anterior tibial vessels.  Dr. Harden  10/02/2019 5 repeat debridement with vascular I&D and excisional debridement  S/p serial debridement again on October 8. PMHx: COPD, CAD, PAD, tobacco use, lumbar radiculopathy, detached retina L eye   OT comments  Pt progressing toward goals, reports needing to have BM upon arrival. Agreeable to attempt OOB mobility to [facility]. Pt max +2 for transfer to [facility] via stedy. BM unsuccessful and pt with dizziness/lightheadedness/soft BP while on BSC. Pt min A for UB ADL and CGA for bed mobility. Pt presenting with impairments listed below, will follow acutely. Patient will benefit from intensive inpatient follow-up therapy, >3 hours/day to maximize safety/ind with ADL/functional mobility.       If plan is discharge home, recommend the following:  Assistance with cooking/housework;Assist for transportation;Two people to help with walking and/or transfers;Two people to help with bathing/dressing/bathroom   Equipment Recommendations  Other (comment) (defer)    Recommendations for Other Services Rehab consult    Precautions / Restrictions Precautions Precautions: Fall;Back;Other  (comment) Precaution Booklet Issued: Yes (comment) Recall of Precautions/Restrictions: Intact Precaution/Restrictions Comments: watch BP (orthostatic); RLE foot drop Required Braces or Orthoses: Spinal Brace Spinal Brace: Thoracolumbosacral orthotic;Applied in sitting position Restrictions Weight Bearing Restrictions Per Provider Order: Yes RLE Weight Bearing Per Provider Order: Weight bearing as tolerated Other Position/Activity Restrictions: transfers only on RLE       Mobility Bed Mobility Overal bed mobility: Needs Assistance Bed Mobility: Rolling, Sidelying to Sit Rolling: Supervision Sidelying to sit: Contact guard assist            Transfers Overall transfer level: Needs assistance Equipment used: Ambulation equipment used Transfers: Sit to/from Stand, Bed to chair/wheelchair/BSC Sit to Stand: Max assist, +2 physical assistance             Transfer via Lift Equipment: Stedy   Balance Overall balance assessment: Needs assistance Sitting-balance support: Feet supported, No upper extremity supported Sitting balance-Leahy Scale: Fair Sitting balance - Comments: L rail   Standing balance support: Bilateral upper extremity supported, Reliant on assistive device for balance Standing balance-Leahy Scale: Poor Standing balance comment: Reliant on RW and external support                           ADL either performed or assessed with clinical judgement   ADL Overall ADL's : Needs assistance/impaired                 Upper Body Dressing : Minimal assistance       Toilet Transfer: Maximal assistance;+2 for physical assistance Toilet Transfer Details (indicate cue type and reason): standing in stedy frame         Functional mobility during ADLs: Maximal assistance;+2 for physical assistance      Extremity/Trunk  Assessment Upper Extremity Assessment Upper Extremity Assessment: Generalized weakness   Lower Extremity Assessment Lower  Extremity Assessment: Defer to PT evaluation        Vision   Additional Comments: detached retina L eye   Perception Perception Perception: Not tested   Praxis Praxis Praxis: Not tested   Communication Communication Communication: No apparent difficulties   Cognition Arousal: Alert Behavior During Therapy: WFL for tasks assessed/performed Cognition: Cognition impaired       Memory impairment (select all impairments): Short-term memory Attention impairment (select first level of impairment): Sustained attention Executive functioning impairment (select all impairments): Problem solving OT - Cognition Comments: pt thinking he is on bedpan upon entry, needing to have BM, no bedpan present                 Following commands: Intact Following commands impaired: Only follows one step commands consistently      Cueing   Cueing Techniques: Verbal cues, Tactile cues, Gestural cues  Exercises      Shoulder Instructions       General Comments see BP in vitals flowsheet    Pertinent Vitals/ Pain       Pain Assessment Pain Assessment: Faces Pain Score: 7  Faces Pain Scale: Hurts whole lot Pain Location: RLE Pain Descriptors / Indicators: Discomfort, Grimacing, Moaning, Guarding Pain Intervention(s): Limited activity within patient's tolerance, Monitored during session, Repositioned  Home Living                                          Prior Functioning/Environment              Frequency  Min 2X/week        Progress Toward Goals  OT Goals(current goals can now be found in the care plan section)  Progress towards OT goals: Progressing toward goals  Acute Rehab OT Goals Patient Stated Goal: to go to reheab OT Goal Formulation: With patient Time For Goal Achievement: 10/14/24 Potential to Achieve Goals: Good ADL Goals Pt Will Perform Lower Body Bathing: sitting/lateral leans;with set-up;with adaptive equipment Pt Will Perform  Lower Body Dressing: with mod assist;sit to/from stand Pt Will Transfer to Toilet: with mod assist;stand pivot transfer;bedside commode Pt/caregiver will Perform Home Exercise Program: Increased ROM;Both right and left upper extremity;Increased strength;With written HEP provided Additional ADL Goal #1: Pt will sit EOB independently for 3 meals/day as a precursor to OOB ADLs  Plan      Co-evaluation    PT/OT/SLP Co-Evaluation/Treatment: Yes Reason for Co-Treatment: Complexity of the patient's impairments (multi-system involvement);Necessary to address cognition/behavior during functional activity;For patient/therapist safety;To address functional/ADL transfers PT goals addressed during session: Mobility/safety with mobility;Balance;Proper use of DME;Strengthening/ROM OT goals addressed during session: ADL's and self-care      AM-PAC OT 6 Clicks Daily Activity     Outcome Measure   Help from another person eating meals?: A Little Help from another person taking care of personal grooming?: A Little Help from another person toileting, which includes using toliet, bedpan, or urinal?: Total Help from another person bathing (including washing, rinsing, drying)?: A Lot Help from another person to put on and taking off regular upper body clothing?: A Little Help from another person to put on and taking off regular lower body clothing?: A Lot 6 Click Score: 14    End of Session Equipment Utilized During Treatment: Gait belt;Rolling walker (2 wheels);Back brace;Oxygen   OT Visit Diagnosis: Unsteadiness on feet (R26.81);Other abnormalities of gait and mobility (R26.89);Muscle weakness (generalized) (M62.81);Pain Pain - Right/Left: Right Pain - part of body: Ankle and joints of foot   Activity Tolerance Patient tolerated treatment well   Patient Left in bed;with call bell/phone within reach;with bed alarm set   Nurse Communication Mobility status;Precautions        Time: 8693-8650 OT  Time Calculation (min): 43 min  Charges: OT General Charges $OT Visit: 1 Visit OT Treatments $Self Care/Home Management : 8-22 mins $Therapeutic Activity: 8-22 mins  Emilya Justen K, OTD, OTR/L SecureChat Preferred Acute Rehab [phone]   Laneta POUR Koonce 10/11/2024, 4:28 PM

## 2024-10-11 NOTE — Progress Notes (Signed)
 Physical Therapy Treatment Patient Details Name: David King MRN: [id number] DOB: 02-20-1956 Today's Date: 10/11/2024   History of Present Illness 68 y.o. 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 Pt went to OR x2 on 9/26 initially for R axial bypass, bilateral femoral thrombectomy and RLE fasciotomy and later for redo RLE thrombectomy. Pt recently underwent L4-5 decompression and fusion on 9/10 with anaphylactic reaction during surgery requiring ICU stay.  09/29/2024 excisional debridement skin and soft tissue muscle and fascia anterior compartment of the right leg transfer of the lateral compartment muscles to the anterior compartment to cover the anterior tibial vessels.  Dr. Harden  10/02/2019 5 repeat debridement with vascular I&D and excisional debridement  S/p serial debridement again on October 8. PMHx: COPD, CAD, PAD, tobacco use, lumbar radiculopathy, detached retina L eye    PT Comments  Pt received in supine, agreeable to therapy session, collaborative session with OT due to pt multidisciplinary therapy needs and need for +2 physical assist for safety. Pt with good effort for transfer training but RLE pain and symptomatic orthostatic hypotension limiting standing tolerance and pt unable to tolerate weight shifting in stance today. Up to +2 maxA to perform transfers from slightly elevated surface heights. Patient will benefit from intensive inpatient follow-up therapy, >3 hours/day  Pulse Rate (!) 117  Pulse Rate Source Monitor  BP 98/66 (edge of bed)  BP Location Right Arm  BP Method Automatic  Patient Position (if appropriate) Sitting  Oxygen  Therapy  SpO2 92 %  O2 Device Room Air  Patient Activity (if Appropriate) In chair (EOB)   Orthostatic Sitting  BP- Sitting (!) 87/67 (on BSC; MAP (75))  Pulse- Sitting 127   Vital Signs  BP 105/74  BP Location  Right Arm  BP Method Automatic  Patient Position (if appropriate) Lying -at end of session  Oxygen  Therapy  SpO2 97 %     If plan is discharge home, recommend the following: Assistance with cooking/housework;Assist for transportation;Help with stairs or ramp for entrance;Two people to help with walking and/or transfers;A lot of help with bathing/dressing/bathroom   Can travel by private vehicle        Equipment Recommendations  Wheelchair (measurements PT);Wheelchair cushion (measurements PT);BSC/3in1 (hoyer pending progress)    Recommendations for Other Services       Precautions / Restrictions Precautions Precautions: Fall;Back;Other (comment) Precaution Booklet Issued: Yes (comment) Recall of Precautions/Restrictions: Intact Precaution/Restrictions Comments: watch BP (orthostatic); RLE foot drop Required Braces or Orthoses: Spinal Brace Spinal Brace: Thoracolumbosacral orthotic;Applied in sitting position Restrictions Weight Bearing Restrictions Per Provider Order: Yes RLE Weight Bearing Per Provider Order: Weight bearing as tolerated Other Position/Activity Restrictions: full WBAT per Harden     Mobility  Bed Mobility Overal bed mobility: Needs Assistance Bed Mobility: Rolling, Sidelying to Sit Rolling: Supervision Sidelying to sit: Contact guard assist       General bed mobility comments: to L EOB    Transfers Overall transfer level: Needs assistance Equipment used: Ambulation equipment used Transfers: Sit to/from Stand, Bed to chair/wheelchair/BSC Sit to Stand: Max assist, +2 physical assistance, From elevated surface, Via lift equipment, Mod assist           General transfer comment: RLE buckling with attempts to stand from air bed<>RW x2 trials, unable to transfer UE from bed to RW handles or fully extend knees/trunk, so after second unsuccessful attempt, pt performs STS  from elevated bed>Stedy to [facility], seated break (unsuccessful BM) then [facility] transfer back  to bed due to orthostatic hypotensive symptoms. +2 modA from elevated Stedy flaps, maxA +2 from EOB and lower surface heights. Transfer via Lift Equipment: Stedy  Ambulation/Gait               General Gait Details: BLE buckling with STS trials so unable to weight shift in stance today in [employer] Mobility     Tilt Bed    Modified Rankin (Stroke Patients Only)       Balance Overall balance assessment: Needs assistance Sitting-balance support: Feet supported, No upper extremity supported Sitting balance-Leahy Scale: Fair Sitting balance - Comments: L rail   Standing balance support: Bilateral upper extremity supported, Reliant on assistive device for balance Standing balance-Leahy Scale: Zero Standing balance comment: Stedy and +2; Poor to Zero, unable to weight shift                            Communication Communication Communication: No apparent difficulties  Cognition Arousal: Alert Behavior During Therapy: WFL for tasks assessed/performed   PT - Cognitive impairments: Memory, Sequencing, Problem solving, Safety/Judgement                       PT - Cognition Comments: Slower processing this date, increased time to perform tasks., HoH and some intermittent confusion. Following commands: Intact Following commands impaired: Only follows one step commands consistently    Cueing Cueing Techniques: Verbal cues, Tactile cues, Gestural cues  Exercises Other Exercises Other Exercises: seated BLE AROM SAQ x 5 reps with upper thigh supported by PTA    General Comments General comments (skin integrity, edema, etc.): see BP in comments above      Pertinent Vitals/Pain Pain Assessment Pain Assessment: Faces Faces Pain Scale: Hurts whole lot Pain Location: RLE with WB (knee>foot) Pain Descriptors / Indicators: Discomfort, Grimacing, Moaning, Guarding Pain Intervention(s): Limited activity within patient's  tolerance, Monitored during session, Premedicated before session, Repositioned    Home Living                          Prior Function            PT Goals (current goals can now be found in the care plan section) Acute Rehab PT Goals Patient Stated Goal: to return to independence and reduce pain PT Goal Formulation: With patient Time For Goal Achievement: 10/09/24 Progress towards PT goals: Progressing toward goals    Frequency    Min 3X/week      PT Plan      Co-evaluation PT/OT/SLP Co-Evaluation/Treatment: Yes Reason for Co-Treatment: Complexity of the patient's impairments (multi-system involvement);Necessary to address cognition/behavior during functional activity;For patient/therapist safety;To address functional/ADL transfers PT goals addressed during session: Mobility/safety with mobility;Balance;Proper use of DME;Strengthening/ROM        AM-PAC PT 6 Clicks Mobility   Outcome Measure  Help needed turning from your back to your side while in a flat bed without using bedrails?: A Little Help needed moving from lying on your back to sitting on the side of a flat bed without using bedrails?: A Little Help needed moving to and from a bed to a chair (including a wheelchair)?: Total Help needed standing up from a chair using your arms (e.g., wheelchair or bedside chair)?:  Total (+2) Help needed to walk in hospital room?: Total Help needed climbing 3-5 steps with a railing? : Total 6 Click Score: 10    End of Session Equipment Utilized During Treatment: Gait belt;Back brace Activity Tolerance: Treatment limited secondary to medical complications (Comment);Other (comment);Patient limited by pain (symptomatic orthostatic hypotension) Patient left: in bed;with call bell/phone within reach;with family/visitor present;Other (comment) (x4 rails up per air bed; heels floated, HOB 30 degrees; spouse in room) Nurse Communication: Mobility status;Need for lift  equipment;Precautions;Other (comment) (orthostatic hypotension; tried to have BM but unsuccessful) PT Visit Diagnosis: Other abnormalities of gait and mobility (R26.89);Muscle weakness (generalized) (M62.81);Other symptoms and signs involving the nervous system (R29.898)     Time: 8682-8651 PT Time Calculation (min) (ACUTE ONLY): 31 min  Charges:    $Therapeutic Activity: 8-22 mins PT General Charges $$ ACUTE PT VISIT: 1 Visit                     Jaala Bohle P., PTA Acute Rehabilitation Services Secure Chat Preferred 9a-5:30pm Office: [phone]    Connell HERO [facility] 10/11/2024, 3:51 PM

## 2024-10-12 ENCOUNTER — Inpatient Hospital Stay (HOSPITAL_COMMUNITY)

## 2024-10-12 DIAGNOSIS — M79A21 Nontraumatic compartment syndrome of right lower extremity: Secondary | ICD-10-CM | POA: Diagnosis not present

## 2024-10-12 DIAGNOSIS — M5416 Radiculopathy, lumbar region: Secondary | ICD-10-CM | POA: Diagnosis not present

## 2024-10-12 LAB — CBC WITH DIFFERENTIAL/PLATELET
Abs Immature Granulocytes: 0.33 K/uL — ABNORMAL HIGH (ref 0.00–0.07)
Basophils Absolute: 0.1 K/uL (ref 0.0–0.1)
Basophils Relative: 1 %
Eosinophils Absolute: 0.3 K/uL (ref 0.0–0.5)
Eosinophils Relative: 2 %
HCT: 26.6 % — ABNORMAL LOW (ref 39.0–52.0)
Hemoglobin: 8.4 g/dL — ABNORMAL LOW (ref 13.0–17.0)
Immature Granulocytes: 3 %
Lymphocytes Relative: 10 %
Lymphs Abs: 1.2 K/uL (ref 0.7–4.0)
MCH: 28.8 pg (ref 26.0–34.0)
MCHC: 31.6 g/dL (ref 30.0–36.0)
MCV: 91.1 fL (ref 80.0–100.0)
Monocytes Absolute: 1.1 K/uL — ABNORMAL HIGH (ref 0.1–1.0)
Monocytes Relative: 9 %
Neutro Abs: 9.1 K/uL — ABNORMAL HIGH (ref 1.7–7.7)
Neutrophils Relative %: 75 %
Platelets: 186 K/uL (ref 150–400)
RBC: 2.92 MIL/uL — ABNORMAL LOW (ref 4.22–5.81)
RDW: 18.3 % — ABNORMAL HIGH (ref 11.5–15.5)
WBC: 12.1 K/uL — ABNORMAL HIGH (ref 4.0–10.5)
nRBC: 0 % (ref 0.0–0.2)

## 2024-10-12 LAB — CULTURE, BLOOD (ROUTINE X 2)
Culture: NO GROWTH
Culture: NO GROWTH
Special Requests: ADEQUATE
Special Requests: ADEQUATE

## 2024-10-12 LAB — COMPREHENSIVE METABOLIC PANEL WITH GFR
ALT: 33 U/L (ref 0–44)
AST: 30 U/L (ref 15–41)
Albumin: 2.3 g/dL — ABNORMAL LOW (ref 3.5–5.0)
Alkaline Phosphatase: 83 U/L (ref 38–126)
Anion gap: 13 (ref 5–15)
BUN: 17 mg/dL (ref 8–23)
CO2: 23 mmol/L (ref 22–32)
Calcium: 8.5 mg/dL — ABNORMAL LOW (ref 8.9–10.3)
Chloride: 97 mmol/L — ABNORMAL LOW (ref 98–111)
Creatinine, Ser: 0.94 mg/dL (ref 0.61–1.24)
GFR, Estimated: >60 mL/min (ref >60)
Glucose, Bld: 115 mg/dL — ABNORMAL HIGH (ref 70–99)
Potassium: 3.2 mmol/L — ABNORMAL LOW (ref 3.5–5.1)
Sodium: 133 mmol/L — ABNORMAL LOW (ref 135–145)
Total Bilirubin: 1.4 mg/dL — ABNORMAL HIGH (ref 0.0–1.2)
Total Protein: 6.1 g/dL — ABNORMAL LOW (ref 6.5–8.1)

## 2024-10-12 LAB — AEROBIC/ANAEROBIC CULTURE W GRAM STAIN (SURGICAL/DEEP WOUND)
Culture: NO GROWTH
Gram Stain: NONE SEEN

## 2024-10-12 MED ORDER — SENNOSIDES-DOCUSATE SODIUM 8.6-50 MG PO TABS
2.0000 | ORAL_TABLET | Freq: Every day | ORAL | Status: DC
  Administered 2024-10-12 – 2024-10-21 (×5): 2 via ORAL
  Filled 2024-10-12 (×8): qty 2

## 2024-10-12 MED ORDER — POTASSIUM CHLORIDE CRYS ER 20 MEQ PO TBCR
40.0000 meq | EXTENDED_RELEASE_TABLET | Freq: Once | ORAL | Status: AC
  Administered 2024-10-12: 40 meq via ORAL
  Filled 2024-10-12: qty 2

## 2024-10-12 NOTE — Patient Care Conference (Cosign Needed Addendum)
 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 Duwaine Barrs Social Worker Present: Graeme Jude, LCSW Nurse Present: Eulalio Falls, RN PT Present: Schuyler Batter, Estill Pereyra, PT OT Present: Camie Hoe, OT PPS Coordinator present : Eleanor Colon, SLP     Current Status/Progress Goal Weekly Team Focus  Bowel/Bladder   Patient intermittently incontinent of bladder. LBM 10/9 with a smear of bowel noted on 10/14.   The patient will achieve regular, complete bowel movements and avoid constipation, maintaining optimal bowel function and comfort.   Assess the patient's bowel habits, noting frequency, consistency, and any signs of difficulty with bowel movements.    Swallow/Nutrition/ Hydration               ADL's      Evals pending          Mobility   very pain limited eval, RLE foot drop, CGA bed mobility, BP soft on eval   min a overall  NMR, strengthening, upright tolerance, transfer training    Communication                Safety/Cognition/ Behavioral Observations               Pain   Patient c/o 9/10 stabbing pain right leg.   Patient pain decrease to 3/10 with ordered pain medications   Assess pain qshift and prn.     Skin   The patient has a wound VAC in place on the right leg along with staples.   Staff will montior site for s/s of infection. Vac will continue to run at rarget pressure of  Assess the wound VAC site for any signs of leakage, infection, or skin breakdown. Ensure that the dressing remains in place and the negative pressure is functioning properly.      Discharge Planning:  TBA    Team Discussion: Patient was admitted post right leg anterior compartment fasciotomy with multiple washouts as well as excisional debridement/wound VAC due to PAD/compartment syndrome/right leg ischemia. Patient with constipation/pain: medication adjusted by MD. Patient progress limited by anxiety, orthostatic hypotension , cognitive deficits and poor endurance.   Patient on target to meet rehab goals: Evals pending  *See Care Plan and progress notes for long and short-term goals.   Revisions to Treatment Plan:  WOC consult Air mattress Prevalon boots  Wound vac Wound care plan Teds Wound care SLP consult  Teaching Needs: Safety, medications, wound care, transfers, toileting, etc.   Current Barriers to Discharge: Decreased caregiver support, Home enviroment access/layout, and Wound care  Possible Resolutions to Barriers: Family Education     Medical Summary Current Status: skin issues- all pictures taken-  R bottom Stage I; WOC has seen; L elbow- severe orthostatic  hypotension-  but HTN at rest;  Barriers to Discharge: Behavior/Mood;Complicated Wound;Electrolyte abnormality;Hypotension;Medical stability;Weight bearing restrictions;Other (comments);Symptomatic Anemia;Uncontrolled Pain;Self-care education  Barriers to Discharge Comments: VAC in place- will go home with it- limited by memory; constipation; severe OH; 81/systolic this AM sitting EOB; Possible Resolutions to [name]: will work on BP-  VVS involved for VAC/RLE;  TBD for d/c date- 2-3 weeks   Continued Need  for Acute Rehabilitation Level of Care: The patient requires daily medical management by a physician with specialized training in physical medicine and rehabilitation for the following reasons: Direction of a multidisciplinary physical rehabilitation program to maximize functional independence : Yes Medical management of patient stability for increased activity during participation in an intensive rehabilitation regime.: Yes Analysis of laboratory values and/or radiology reports with any subsequent need for medication adjustment and/or medical intervention. : Yes   I attest that I was present, lead the team conference, and concur with the assessment and plan of the team.   David King 10/12/2024, 1113 am

## 2024-10-12 NOTE — Progress Notes (Signed)
 Inpatient Rehabilitation Admission Medication Review by a Pharmacist  A complete drug regimen review was completed for this patient to identify any potential clinically significant medication issues.  High Risk Drug Classes Is patient taking? Indication by Medication  Antipsychotic No   Anticoagulant Yes Apixaban - DVT  Antibiotic Yes Doxycycline - wound infection  Opioid Yes Oxycodone  - pain  Antiplatelet Yes ASA - PAD  Hypoglycemics/insulin  No   Vasoactive Medication No   Chemotherapy No   Other Yes Albuterol  - short of breath Vitamin C /thiamine/zinc  - supplement Celexa /cymbalta  - depression Breztri  - COPD Ibuprofen  - pain Lyrica  - neuropathy Flomax  - BPH Ambien  - sleep Crestor  - HLD     Type of Medication Issue Identified Description of Issue Recommendation(s)  Drug Interaction(s) (clinically significant)     Duplicate Therapy     Allergy      No Medication Administration End Date     Incorrect Dose     Additional Drug Therapy Needed     Significant med changes from prior encounter (inform family/care partners about these prior to discharge).    Other  Vitamin D, lidocaine  patch, senno S, sorbitol , bisacodyl  Resume as needed in CIR or at discharge    Clinically significant medication issues were identified that warrant physician communication and completion of prescribed/recommended actions by midnight of the next day:  No  Name of provider notified for urgent issues identified:   Provider Method of Notification:     Pharmacist comments:   Time spent performing this drug regimen review (minutes):  20   Sergio Batch, PharmD, [place], AAHIVP, CPP Infectious Disease Pharmacist 10/12/2024 8:33 AM

## 2024-10-12 NOTE — Progress Notes (Signed)
 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 Active Problems:   Hx of fasciotomy   Lumbar radiculopathy   Protein-calorie malnutrition, severe  Past Medical History:  Past Medical History:  Diagnosis Date   Anxiety    new dx   Arthritis    lumbar   Atherosclerotic vascular disease    Cataract    COPD (chronic obstructive pulmonary disease) (HCC)    Diverticulosis    Elevated PSA    Headache(784.0)    MIGRAINES   Hypertension 2021   Iritis    CHRONIC IN LEFT EYE - SOME VISIAL IMPAIRMENT IN LEFT EYE   Neuromuscular disorder (HCC)    left leg/foot,pinched siactic nerve   Pain    PAIN LEFT HIP AND DOWN LT LEG WITH NUMBNESS IN LEFT LEG--PT STATES SCIATIC NERVE IMPINGEMENT - PT PLANS BACK IN THE NEAR SURGERY.   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   Renal cysts, acquired, bilateral 03/19/2013   several simple , CT   Urinary frequency    AND NOCTURIA   Past Surgical History:  Past Surgical History:  Procedure Laterality Date   ABDOMINAL AORTOGRAM W/LOWER EXTREMITY N/A 09/21/2024   Procedure: ABDOMINAL AORTOGRAM W/LOWER EXTREMITY;  Surgeon: Serene Gaile ORN, MD;  Location: [facility];  Service: Cardiovascular;  Laterality: N/A;   APPLICATION OF WOUND VAC Right 09/27/2024   Procedure: APPLICATION, WOUND VAC RIGHT LOWER EXTREMITY;  Surgeon: Lanis Fonda BRAVO, MD;  Location: [facility] OR;  Service: Vascular;  Laterality: Right;   APPLICATION OF WOUND VAC Right 10/01/2024   Procedure: APPLICATION, WOUND VAC;  Surgeon: Lanis Fonda BRAVO, MD;  Location: [facility] OR;  Service: Vascular;  Laterality: Right;   APPLICATION, SKIN SUBSTITUTE Right 09/27/2024   Procedure: APPLICATION, MYRIAD SKIN SUBSTITUTE;  Surgeon: Lanis Fonda BRAVO, MD;  Location: [facility] OR;  Service: Vascular;  Laterality: Right;   APPLICATION, SKIN SUBSTITUTE Right 10/01/2024   Procedure: APPLICATION, SKIN SUBSTITUTE KERECIS 95SQ CM;  Surgeon: Lanis Fonda BRAVO, MD;  Location: [facility] OR;  Service: Vascular;  Laterality: Right;   AXILLARY-FEMORAL BYPASS GRAFT Right 09/24/2024   Procedure: CREATION, BYPASS, ARTERIAL, AXILLARY TO BILATERAL FEMORAL, USING PROPATEN X 80CM X 2 GRAFT;  Surgeon: Serene Gaile ORN, MD;  Location: [facility] OR;  Service: Vascular;  Laterality: Right;   BACK SURGERY     CARDIAC CATHETERIZATION  03/10/2018   CATARACT EXTRACTION W/ INTRAOCULAR LENS IMPLANT Left    ENDARTERECTOMY FEMORAL Bilateral 09/24/2024   Procedure: BILATERAL FEMORAL ENDARTERECTOMY;  Surgeon: Serene Gaile ORN, MD;  Location: [facility] OR;  Service: Vascular;  Laterality: Bilateral;   EYE SURGERY     RETINAL SURGERY LEFT EYE   FASCIOTOMY Right 09/24/2024   Procedure: RIGHT LOWER LEG FOUR COMPARTMENT FASCIOTOMY WITH RESECTION OF TIBIALIS ANTERIOR;  Surgeon: Serene Gaile ORN, MD;  Location: [facility];  Service: Vascular;  Laterality: Right;   FASCIOTOMY CLOSURE Right 09/27/2024   Procedure: RIGHT LOWER EXTREMITY FASCIOTOMY WASHOUT;  Surgeon: Lanis Fonda BRAVO, MD;  Location: [facility] OR;  Service: Vascular;  Laterality: Right;  RIGHT LEG CLOSURE   HYDROCELE EXCISION Left 03/05/2013   Procedure: HYDROCELECTOMY ADULT;  Surgeon: Donnice Gwenyth Brooks, MD;  Location: [facility];  Service: Urology;  Laterality: Left;   INCISION AND DRAINAGE OF DEEP ABSCESS, CALF Right 09/29/2024  Procedure: INCISION AND DRAINAGE OF DEEP ABSCESS, CALF;  Surgeon: Harden Jerona GAILS, MD;  Location: [facility];  Service: Orthopedics;  Laterality: Right;   INCISION AND DRAINAGE OF DEEP ABSCESS, CALF Right 10/06/2024   Procedure: INCISION AND DRAINAGE OF DEEP ABSCESS, CALF RIGHT;  Surgeon: Harden Jerona GAILS, MD;  Location: [facility];  Service: Orthopedics;  Laterality: Right;  DEBRIDEMENT RIGHT LEG   INCISION AND  DRAINAGE OF WOUND Right 10/01/2024   Procedure: IRRIGATION AND DEBRIDEMENT WOUND;  Surgeon: Lanis Fonda BRAVO, MD;  Location: [facility] OR;  Service: Vascular;  Laterality: Right;   INCISIONAL HERNIA REPAIR N/A 06/18/2022   Procedure: REPAIR OF INTERNAL HERNIA;  Surgeon: Rubin Calamity, MD;  Location: [facility] OR;  Service: General;  Laterality: N/A;   INGUINAL HERNIA REPAIR Bilateral AGE 39   INSERTION OF MESH N/A 06/11/2022   Procedure: INSERTION OF MESH;  Surgeon: Rubin Calamity, MD;  Location: [facility] OR;  Service: General;  Laterality: N/A;   INSERTION OF MESH N/A 06/18/2022   Procedure: INSERTION OF MESH;  Surgeon: Rubin Calamity, MD;  Location: [facility] OR;  Service: General;  Laterality: N/A;   KYPHOPLASTY N/A 05/08/2023   Procedure: THORACIC SEVEN KYPHOPLASTY;  Surgeon: Beuford Anes, MD;  [facility]: [facility];  Service: Orthopedics;  Laterality: N/A;   LAPAROSCOPY N/A 06/18/2022   Procedure: LAPAROSCOPY DIAGNOSTIC;  Surgeon: Rubin Calamity, MD;  [facility];  Service: General;  Laterality: N/A;   LOWER EXTREMITY ANGIOGRAM Right 09/24/2024   Procedure: RIGHT LOWER EXTREMITY ANGIOGRAM AND RIGHT POPLETEAL THROMBECTOMY;  Surgeon: Serene Gaile ORN, MD;  [facility];  Service: Vascular;  Laterality: Right;   LYMPHADENECTOMY Bilateral 05/06/2013   Procedure: REDGIE;  Surgeon: Noretta Ferrara, MD;  Location: [facility];  Service: Urology;  Laterality: Bilateral;   PROSTATE BIOPSY N/A 03/05/2013   Procedure: PROSTATE BIOPSY and ultrasound;  Surgeon: Donnice Gwenyth Brooks, MD;  Location: [facility];  Service: Urology;  Laterality: N/A;   REMOVAL BURSA SAC, LEFT ELBOW  1996   ROBOT ASSISTED LAPAROSCOPIC RADICAL PROSTATECTOMY N/A 05/06/2013   Procedure: ROBOTIC ASSISTED LAPAROSCOPIC RADICAL PROSTATECTOMY LEVEL 3;  Surgeon: Noretta Ferrara, MD;  Location: [facility];  Service: Urology;  Laterality: N/A;   THROMBECTOMY FEMORAL ARTERY Bilateral 09/24/2024   Procedure: BILATERAL FEMORAL ARTERY THROMBECTOMY;   Surgeon: Serene Gaile ORN, MD;  Location[facility];  Service: Vascular;  Laterality: Bilateral;   TONSILLECTOMY     as achild   TRANSFORAMINAL LUMBAR INTERBODY FUSION (TLIF) WITH PEDICLE SCREW FIXATION 1 LEVEL Left 09/08/2024   Procedure: LEFT-SIDED LUMBAR 4- LUMBAR 5 TRANSFORAMINAL LUMBAR INTERBODY FUSION AND DECOMPRESSION WITH INSTRUMENTATION AND ALLOGRAFT;  Surgeon: Beuford Anes, MD;  Location: [facility] OR;  Service: Orthopedics;  Laterality: Left;  LEFT-SIDED LUMBAR 4- LUMBAR 5 TRANSFORAMINAL LUMBAR INTERBODY FUSION AND DECOMPRESSION WITH INSTRUMENTATION AND ALLOGRAFT   XI ROBOTIC ASSISTED VENTRAL HERNIA N/A 06/11/2022   Procedure: ROBOTIC INCISIONAL HERNIA REPAIR WITH MESH;  Surgeon: Rubin Calamity, MD;  Location: [facility] OR;  Service: General;  Laterality: N/A;   Social History:  reports that he quit smoking about 7 years ago. His smoking use included cigarettes. He started smoking about 54 years ago. He has a 94 pack-year smoking history. He has never used smokeless tobacco. He reports current alcohol use of about 14.0 standard drinks of alcohol per week. He reports that he does not use drugs.  Family / Support Systems Marital Status: Married How Long?: 42 years Patient Roles: Spouse, Other (Comment) (friends) Spouse/Significant Other: David King [phone] Other Supports: Friends Anticipated Caregiver:  Wife Ability/Limitations of Caregiver: Wife has been assisting pt for the past few months, she also assists a Network engineer and friend with grocery shopping, etc Caregiver Availability: 24/7 Family Dynamics: Close with wife and friends, he feels bad his wife has to assist him so much. He hopes he will be able to do more for himself to lessen the burden on her   Social History Preferred language: English Religion: Non-Denominational Cultural Background: NA Education: HS Health Literacy - How often do you need to have someone help you when you read instructions, pamphlets, or other written material from your  doctor or pharmacy?: Never Writes: Yes Employment Status: Retired Marine scientist Issues: no issues Guardian/Conservator: None-according to MD pt is capable of making his own decisions while here    Abuse/Neglect Abuse/Neglect Assessment Can Be Completed: Yes Physical Abuse: Denies Verbal Abuse: Denies Sexual Abuse: Denies Exploitation of patient/patient's resources: Denies Self-Neglect: Denies   Patient response to: Social Isolation - How often do you feel lonely or isolated from those around you?: Never   Emotional Status Pt's affect, behavior and adjustment status: Pt is motivated to do well here, but is having BP and other issues today. He is not feeling well and feels it will be a wasted day due to can not push himself if BP is crashing when he tries to get up. He was mobile prior to admission and did need help with his ADL's prior to admission Recent Psychosocial Issues: other health issues Psychiatric History: History of anxiety takes medications for this but is having much more pain issues now. Have placed on the neuro-psych list to be seen atleast by Monday Substance Abuse History: NA-remote smoking quit years ago   Patient / Family Perceptions, Expectations & Goals Pt/Family understanding of illness & functional limitations: Pt is able to explain his surgery and issues as a result. He does talk with the MDs involved and hopes they can work on managing his pain, so he is able to participate in therapies while here. MD is aware of this and working on this Premorbid pt/family roles/activities: husband, friend, neighbor, etc Anticipated changes in roles/activities/participation: resume Pt/family expectations/goals: Pt states:  I feel awful I know I can't do a lot today with my blood pressure issues.   Community [employer] Agencies: None Premorbid Home Care/DME Agencies: Other (Comment) (hurry cane, tub seat, cane, rw) Transportation available at  discharge: self and wife Is the patient able to respond to transportation needs?: Yes In the past 12 months, has lack of transportation kept you from medical appointments or from getting medications?: No In the past 12 months, has lack of transportation kept you from meetings, work, or from getting things needed for daily living?: No Resource referrals recommended: Neuropsychology   Discharge Planning Living Arrangements: Spouse/significant other Support Systems: Spouse/significant other Insurance Resources: Media planner (specify) ([employer] Medicare) Surveyor, quantity Resources: Restaurant manager, fast food Screen Referred: No Living Expenses: Banker Management: Spouse Does the patient have any problems obtaining your medications?: No Care Coordinator Barriers to Discharge: Decreased caregiver support, Lack of/limited family support, Insurance for SNF coverage Care Coordinator Anticipated Follow Up Needs: HH/OP Expected length of stay: [facility] 2 weeks  Clinical Impression SW made several attempts to meet with pt today but patient in care in process.   Pt is a return patient; assessment done on 09/16/24.  1636- SW spoke with pt wife David King  to introduce self, explain role, discuss discharge process, and inform on updates from team conference with [facility] 2 weeks. She  confirms pt will discharge to home with her and she will be primary caregiver. SW shared pt will likely go home with wound vac, and SW will work with [facility] to confirm final care needs and wound vac prior to discharge.   SW sent tentative wound vac referral to [facility] with [facility].   David King 10/12/2024, 4:34 PM

## 2024-10-12 NOTE — Progress Notes (Signed)
 Physical Therapy Session Note  Patient Details  Name: David King MRN: [id number] Date of Birth: [birth year]  Today's Date: 10/12/2024 PT Individual Time: 1400-1500 PT Individual Time Calculation (min): 60 min   Short Term Goals: Week 1:  PT Short Term Goal 1 (Week 1): Pt will tolerate sitting upright >2 hours PT Short Term Goal 2 (Week 1): Pt will perform stedy transfers with mod a or better PT Short Term Goal 3 (Week 1): Pt will consistently perform supine<>sit with CGA  Skilled Therapeutic Interventions/Progress Updates:  pt received in bed and agreeable to therapy. Pt reports 8/10 pain, declines intervention at this time. Session focused on bed level exercise to raise BP and improve LE strength and endurance.  Pt performed heel slides BIL, LLE ankle pumps, RLE SAQ, SLR BIL, bridges with assist for RLE placement. Pt's wife present, so discussed home set up, equipment available at home, and pt cognition. Pt's wife [name] also notes cognitive changes. Secure chat sent to team to discuss potential SLP eval. Pt also requested to use bed pan during session. Pt able to roll with min a to place and remove bed pan, no void noted but brief soiled with urine, so dependent brief change. Pt remained in bed at end of session, was left with all needs in reach and alarm active.   Therapy Documentation Precautions:  Precautions Precautions: Fall, Back Precaution Booklet Issued: Yes (comment) Recall of Precautions/Restrictions: Impaired Precaution/Restrictions Comments: watch BP (orthostatic); RLE foot drop, wound VAC Required Braces or Orthoses: Spinal Brace Spinal Brace: Thoracolumbosacral orthotic, Applied in sitting position Restrictions Weight Bearing Restrictions Per Provider Order: Yes RLE Weight Bearing Per Provider Order: Weight bearing as tolerated Other Position/Activity Restrictions: transfers only on RLE General:     Therapy/Group: Individual Therapy  Caitrin Pendergraph C  Wyndi Northrup 10/12/2024, 3:18 PM

## 2024-10-12 NOTE — Discharge Instructions (Addendum)
 Inpatient Rehab Discharge Instructions  David King Discharge date and time: No discharge date for patient encounter.   Activities/Precautions/ Functional Status: Activity: Back brace when out of bed Diet: regular diet Wound Care: Vinegar soaks mixed 10 parts warm water  1 part vinegar soak left foot for 20 to 30 minutes with vinegar wash claws.  Then dry and apply Eucerin cream.  Foam dressing to buttocks changed 2 times weekly and as needed soiling... Right lower leg cleanse wound with Vashe, place Vashe soaked 4 x 4 gauze over wound bed cover with dry gauze, wrap with Kerlix and Ace wrap change daily    Functional status:  ___ No restrictions     ___ Walk up steps independently ___ 24/7 supervision/assistance   ___ Walk up steps with assistance ___ Intermittent supervision/assistance  ___ Bathe/dress independently ___ Walk with walker     _x__ Bathe/dress with assistance ___ Walk Independently    ___ Shower independently ___ Walk with assistance    ___ Shower with assistance ___ No alcohol     ___ Return to work/school ________  Special Instructions:  No driving smoking or alcohol   COMMUNITY REFERRALS UPON DISCHARGE:    Home Health:   PT      OT    ST     RN                     Agency:[facility]  Phone:[phone]  *Please expect follow-up within 2-3 business days for discharge to schedule your home visit. If you have not received follow-up, be sure to contact the site directly.*      Medical Equipment/Items Ordered: wheelchair                                                  Agency/Supplier: [employer] [phone]     My questions have been answered and I understand these instructions. I will adhere to these goals and the provided educational materials after my discharge from the hospital.  Patient/Caregiver Signature _______________________________ Date __________  Clinician Signature _______________________________________ Date __________  Please  bring this form and your medication list with you to all your follow-up doctor's appointments.   Information on my medicine - ELIQUIS (apixaban)  This medication education was reviewed with me or my healthcare representative as part of my discharge preparation.    Why was Eliquis prescribed for you? Eliquis was prescribed to treat blood clots that may have been found in the veins of your legs (deep vein thrombosis) or in your lungs (pulmonary embolism) and to reduce the risk of them occurring again.  What do You need to know about Eliquis ? Continue Eliquis 5 mg tablet taken TWICE daily.  Eliquis may be taken with or without food.   Try to take the dose about the same time in the morning and in the evening. If you have difficulty swallowing the tablet whole please discuss with your pharmacist how to take the medication safely.  Take Eliquis exactly as prescribed and DO NOT stop taking Eliquis without talking to the doctor who prescribed the medication.  Stopping may increase your risk of developing a new blood clot.  Refill your prescription before you run out.  After discharge, you should have regular check-up appointments with your healthcare provider that is prescribing your Eliquis.    What do  you do if you miss a dose? If a dose of ELIQUIS is not taken at the scheduled time, take it as soon as possible on the same day and twice-daily administration should be resumed. The dose should not be doubled to make up for a missed dose.  Important Safety Information A possible side effect of Eliquis is bleeding. You should call your healthcare provider right away if you experience any of the following: Bleeding from an injury or your nose that does not stop. Unusual colored urine (red or dark brown) or unusual colored stools (red or black). Unusual bruising for unknown reasons. A serious fall or if you hit your head (even if there is no bleeding).  Some medicines may interact with  Eliquis and might increase your risk of bleeding or clotting while on Eliquis. To help avoid this, consult your healthcare provider or pharmacist prior to using any new prescription or non-prescription medications, including herbals, vitamins, non-steroidal anti-inflammatory drugs (NSAIDs) and supplements.  This website has more information on Eliquis (apixaban): http://www.eliquis.com/eliquis/home

## 2024-10-12 NOTE — Plan of Care (Signed)
  Problem: RH Balance Goal: LTG: Patient will maintain dynamic sitting balance (OT) Description: LTG:  Patient will maintain dynamic sitting balance with assistance during activities of daily living (OT) Flowsheets (Taken 10/12/2024 1226) LTG: Pt will maintain dynamic sitting balance during ADLs with: Supervision/Verbal cueing Goal: LTG Patient will maintain dynamic standing with ADLs (OT) Description: LTG:  Patient will maintain dynamic standing balance with assist during activities of daily living (OT)  Flowsheets (Taken 10/12/2024 1226) LTG: Pt will maintain dynamic standing balance during ADLs with: Contact Guard/Touching assist   Problem: RH Grooming Goal: LTG Patient will perform grooming w/assist,cues/equip (OT) Description: LTG: Patient will perform grooming with assist, with/without cues using equipment (OT) Flowsheets (Taken 10/12/2024 1226) LTG: Pt will perform grooming with assistance level of: Set up assist    Problem: RH Bathing Goal: LTG Patient will bathe all body parts with assist levels (OT) Description: LTG: Patient will bathe all body parts with assist levels (OT) Flowsheets (Taken 10/12/2024 1226) LTG: Pt will perform bathing with assistance level/cueing: Minimal Assistance - Patient > 75% LTG: Position pt will perform bathing: At sink   Problem: RH Dressing Goal: LTG Patient will perform upper body dressing (OT) Description: LTG Patient will perform upper body dressing with assist, with/without cues (OT). Flowsheets (Taken 10/12/2024 1226) LTG: Pt will perform upper body dressing with assistance level of: Set up assist Goal: LTG Patient will perform lower body dressing w/assist (OT) Description: LTG: Patient will perform lower body dressing with assist, with/without cues in positioning using equipment (OT) Flowsheets (Taken 10/12/2024 1226) LTG: Pt will perform lower body dressing with assistance level of: Contact Guard/Touching assist   Problem: RH  Toileting Goal: LTG Patient will perform toileting task (3/3 steps) with assistance level (OT) Description: LTG: Patient will perform toileting task (3/3 steps) with assistance level (OT)  Flowsheets (Taken 10/12/2024 1226) LTG: Pt will perform toileting task (3/3 steps) with assistance level: Contact Guard/Touching assist   Problem: RH Functional Use of Upper Extremity Goal: LTG Patient will use RT/LT upper extremity as a (OT) Description: LTG: Patient will use right/left upper extremity as a stabilizer/gross assist/diminished/nondominant/dominant level with assist, with/without cues during functional activity (OT) Flowsheets (Taken 10/12/2024 1226) LTG: Pt will use upper extremity in functional activity with assistance level of: Independent Note: Improve BUE strength to 5/5   Problem: RH Toilet Transfers Goal: LTG Patient will perform toilet transfers w/assist (OT) Description: LTG: Patient will perform toilet transfers with assist, with/without cues using equipment (OT) Flowsheets (Taken 10/12/2024 1226) LTG: Pt will perform toilet transfers with assistance level of: Contact Guard/Touching assist   Problem: RH Tub/Shower Transfers Goal: LTG Patient will perform tub/shower transfers w/assist (OT) Description: LTG: Patient will perform tub/shower transfers with assist, with/without cues using equipment (OT) Flowsheets (Taken 10/12/2024 1226) LTG: Pt will perform tub/shower stall transfers with assistance level of: Contact Guard/Touching assist LTG: Pt will perform tub/shower transfers from: Walk in shower

## 2024-10-12 NOTE — Progress Notes (Signed)
 Ok to give kcl 40meq x1 for k 3.2 per Dr. Lovorn.   Sergio Batch, PharmD, BCIDP, AAHIVP, CPP Infectious Disease Pharmacist 10/12/2024 8:44 AM

## 2024-10-12 NOTE — Progress Notes (Signed)
 Inpatient Rehabilitation  Patient information reviewed and entered into eRehab system by [facility]. Karen Kays., CCC/SLP, PPS Coordinator.  Information including medical coding, functional ability and quality indicators will be reviewed and updated through discharge.

## 2024-10-12 NOTE — Discharge Summary (Signed)
 Physician Discharge Summary  Patient ID: David King MRN: [id number] DOB/AGE: 1956/10/09 68 y.o.  Admit date: 10/11/2024 Discharge date: 11/05/2024  Discharge Diagnoses:  Principal Problem:   Lumbar radiculopathy DVT prophylaxis Pain management PAD/compartment syndrome Acute blood loss anemia AKI Hyperlipidemia COPD/remote tobacco use History of prostate cancer/urinary retention History of detached retina left eye Mood stabilization  Discharged Condition: Stable  Significant Diagnostic Studies: DG Lumbar Spine 2-3 Views Result Date: 10/08/2024 CLINICAL DATA:  Postoperative check EXAM: LUMBAR SPINE - 2-3 VIEW COMPARISON:  Lumbar localization image [id number]. MRI lumbar spine 09/13/2024 FINDINGS: Five lumbar type vertebral bodies. Postoperative changes with posterior rod and screw fixation at L4-5, intervertebral prosthesis at L4-5, and intervertebral prosthesis with anterior screw fixation at L5-S1. aortoiliac vascular stents. Mild lumbar scoliosis convex towards the left. No anterior subluxation. Diffuse degenerative change throughout the lumbar spine with narrowed interspaces and endplate osteophyte formation. No vertebral compression deformities. Visualized sacrum appears intact. IMPRESSION: Degenerative and postoperative changes in the lumbar spine as described. No acute displaced fractures are identified. Electronically Signed   By: Elsie Gravely M.D.   On: 10/08/2024 20:19   DG Chest Port 1 View Result Date: 10/06/2024 CLINICAL DATA:  Hypoxia.  COPD. EXAM: PORTABLE CHEST 1 VIEW COMPARISON:  09/30/2024 FINDINGS: Normal sized heart. Interval mild-to-moderate right basilar atelectasis and minimal left basilar atelectasis. Stable mild diffuse peribronchial thickening. Tortuous and partially calcified thoracic aorta. Stable midthoracic vertebral compression deformity with kyphoplasty material. IMPRESSION: 1. Interval mild-to-moderate right basilar atelectasis and minimal left basilar  atelectasis. 2. Stable mild chronic bronchitic changes. Electronically Signed   By: Elspeth Bathe M.D.   On: 10/06/2024 12:21   DG Abd 1 View Result Date: 10/04/2024 CLINICAL DATA:  Abdominal pain. EXAM: ABDOMEN - 1 VIEW COMPARISON:  CT 09/23/2019 FINDINGS: No bowel dilatation or evidence of obstruction. Moderate volume of stool in the colon. Iliac vascular stents in place. Left renal calculi are obscured by overlying bowel gas. Lumbar fusion hardware. IMPRESSION: No bowel obstruction. Moderate colonic stool burden. Electronically Signed   By: Andrea Gasman M.D.   On: 10/04/2024 19:13   DG CHEST PORT 1 VIEW Result Date: 09/30/2024 CLINICAL DATA:  Shortness of breath for the past 2 days.  Ex-smoker. EXAM: PORTABLE CHEST 1 VIEW COMPARISON:  09/08/2024 FINDINGS: The endotracheal tube has been removed. Normal-sized heart. Tortuous and partially calcified thoracic aorta. Clear lungs with normal vascularity. Stable mild chronic peribronchial thickening. Stable midthoracic vertebral compression deformity and kyphoplasty material. IMPRESSION: 1. No acute abnormality. 2. Stable mild chronic bronchitic changes. Electronically Signed   By: Elspeth Bathe M.D.   On: 09/30/2024 12:58   HYBRID OR IMAGING ([facility] ONLY) Result Date: 09/24/2024 There is no interpretation for this exam.  This order is for images obtained during a surgical procedure.  Please See Surgeries Tab for more information regarding the procedure.   HYBRID OR IMAGING ([facility] ONLY) Result Date: 09/24/2024 There is no interpretation for this exam.  This order is for images obtained during a surgical procedure.  Please See Surgeries Tab for more information regarding the procedure.   CT Angio Chest/Abd/Pel for Dissection W and/or W/WO Result Date: 09/22/2024 CLINICAL DATA:  Aortic aneurysm suspected EXAM: CT ANGIOGRAPHY CHEST, ABDOMEN AND PELVIS TECHNIQUE: Non-contrast CT of the chest was initially obtained. Multidetector CT imaging through the chest,  abdomen and pelvis was performed using the standard protocol during bolus administration of intravenous contrast. Multiplanar reconstructed images and MIPs were obtained and reviewed to evaluate the vascular anatomy. RADIATION DOSE REDUCTION:  This exam was performed according to the departmental dose-optimization program which includes automated exposure control, adjustment of the mA and/or kV according to patient size and/or use of iterative reconstruction technique. CONTRAST:  75mL OMNIPAQUE  IOHEXOL  350 MG/ML SOLN COMPARISON:  Chest CT February 25, 2024 CT abdomen November 19, 2022 FINDINGS: CTA CHEST FINDINGS Cardiovascular: The heart size is normal. Ascending aorta measures 3.5 cm. Pulmonary artery is normal. Atherosclerotic calcifications of coronary arteries and aorta. No pericardial fluid. Mediastinum/Nodes: No suspicious lymphadenopathy. Hiatal hernia. Normal thyroid. Lungs/Pleura: Upper lobe predominant centrilobular emphysematous changes. Lower lobe subpleural fibrotic changes and interlobular septal thickening. 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 Partially calcified left upper lobe micronodule (7/17), stable to prior. No new nodules. Peri osteophyte fibrosis in posteromedial right lower lobe. (7/85). No pleural effusion. Musculoskeletal: Degenerative changes of the spine. Review of the MIP images confirms the above findings. CTA ABDOMEN AND PELVIS FINDINGS VASCULAR Aorta: Severe atherosclerotic calcifications. Infrarenal aneurysmal dilation of aorta measuring 3.2 x 3.7 x 6.8 cm with mural thrombus, stable to prior. Celiac: Patent without evidence of aneurysm, dissection, vasculitis or significant stenosis. SMA: Patent without evidence of aneurysm, dissection, vasculitis or significant stenosis. Renals: Both renal arteries are patent without evidence of aneurysm, dissection, vasculitis, fibromuscular dysplasia or significant stenosis. IMA: Patent without evidence of  aneurysm, dissection, vasculitis or significant stenosis.Aorto bi-iliac stent graft identified. Inflow: Patent without evidence of dissection, vasculitis or significant stenosis. Veins: No obvious venous abnormality within the limitations of this arterial phase study. Review of the MIP images confirms the above findings. NON-VASCULAR Hepatobiliary: Nodular liver contour. Mild hepatic steatosis. No focal liver lesion. Gallbladder is unremarkable. No biliary dilatation. Pancreas: Unremarkable. No pancreatic ductal dilatation or surrounding inflammatory changes. Spleen: Scattered calcifications of the spleen suggestive of prior granulomatous disease. Adrenals/Urinary Tract: Multiple bilateral renal cortical nonenhancing lesions most consistent with simple cysts which does not require imaging follow-up. Few of the renal cortical lesions demonstrate calcification versus enhancing component in right inferolateral cortex (5/124) Nonobstructive nephrolithiasis measuring 1.7 cm in left kidney interpolar region, stable. Previously seen left renal pelvis nonobstructive nephrolithiasis is resolved. Bladder is decompressed and contains Foley catheter balloon and foci of air likely due to instrumentation. Stomach/Bowel: Diverticulosis without diverticulitis. Stomach is unremarkable. No bowel obstruction. Normal appendix. Lymphatic: No suspicious lymphadenopathy. Reproductive: Status post prostatectomy. Other: No abdominal wall hernia or abnormality. No abdominopelvic ascites. Musculoskeletal: No fracture is seen. Spinal fusion device in lower lumbar spine. Status post vertebroplasty of T7 vertebral body. Review of the MIP images confirms the above findings. IMPRESSION: Stable infrarenal aortic aneurysm.  Aorto bi-iliac stent graft. Cirrhotic liver morphology.  No focal liver lesion identified. 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 Left apical partially calcified micro nodules, stable  to prior. No new nodules. No imaging follow-up required in a low risk patient. Prostatectomy without suspicious finding at the surgical bed. Electronically Signed   By: Megan  Zare M.D.   On: 09/22/2024 18:54   VAS US  LOWER EXTREMITY ARTERIAL DUPLEX Result Date: 09/21/2024 LOWER EXTREMITY ARTERIAL DUPLEX STUDY Patient Name:  David King  Date of Exam:   09/20/2024 Medical Rec #: [id number]      Accession #:    [id number] Date of Birth: October 14, 1956      Patient Gender: M Patient Age:   65 years Exam Location:  [facility] Procedure:      VAS US  LOWER EXTREMITY ARTERIAL DUPLEX Referring Phys: MURRAY [facility] --------------------------------------------------------------------------------  Indications: Rest pain,  and peripheral artery disease. High Risk Factors: Hypertension, past history of smoking, coronary artery                    disease. Other Factors: PAD.  Vascular Interventions: Hx of bilateral CIA stenting, left EIA and CFA stenting                         at [facility] 02/26/2022. Current ABI:            unable to obtain Comparison Study: Previous arterial duplex on 07/05/2024 at [facility] was limited,                   however CFA, SFA, and PFA showed triphasic waveforms. RLE ABI                   on 05/05/2024 was 1.08 Performing Technologist: Ezzie Potters RVT, RDMS  Examination Guidelines: A complete evaluation includes B-mode imaging, spectral Doppler, color Doppler, and power Doppler as needed of all accessible portions of each vessel. Bilateral testing is considered an integral part of a complete examination. Limited examinations for reoccurring indications may be performed as noted.  +-----------+--------+-----+--------+-------------------+--------+ RIGHT      PSV cm/sRatioStenosisWaveform           Comments +-----------+--------+-----+--------+-------------------+--------+ CIA Prox   49                   dampened monophasic          +-----------+--------+-----+--------+-------------------+--------+ EIA Mid    19                   dampened monophasic         +-----------+--------+-----+--------+-------------------+--------+ CFA Mid    29                   dampened monophasic         +-----------+--------+-----+--------+-------------------+--------+ DFA        44                                               +-----------+--------+-----+--------+-------------------+--------+ SFA Prox   17                   dampened monophasic         +-----------+--------+-----+--------+-------------------+--------+ SFA Mid    16                   dampened monophasic         +-----------+--------+-----+--------+-------------------+--------+ SFA Distal              occluded                            +-----------+--------+-----+--------+-------------------+--------+ POP Prox                occluded                            +-----------+--------+-----+--------+-------------------+--------+ POP Mid                 occluded                            +-----------+--------+-----+--------+-------------------+--------+ POP Distal 10  dampened monophasic         +-----------+--------+-----+--------+-------------------+--------+ TP Trunk   6                    dampened monophasic         +-----------+--------+-----+--------+-------------------+--------+ ATA Prox   6                    dampened monophasic         +-----------+--------+-----+--------+-------------------+--------+ ATA Mid    4                    dampened monophasic         +-----------+--------+-----+--------+-------------------+--------+ ATA Distal              occluded                            +-----------+--------+-----+--------+-------------------+--------+ PTA Prox   4                    dampened monophasic         +-----------+--------+-----+--------+-------------------+--------+ PTA  Mid                 occluded                            +-----------+--------+-----+--------+-------------------+--------+ PTA Distal              occluded                            +-----------+--------+-----+--------+-------------------+--------+ PERO Prox               occluded                            +-----------+--------+-----+--------+-------------------+--------+ PERO Mid                occluded                            +-----------+--------+-----+--------+-------------------+--------+ PERO Distal             occluded                            +-----------+--------+-----+--------+-------------------+--------+ DP                      occluded                            +-----------+--------+-----+--------+-------------------+--------+  Summary: Right: Dampened monophasic waveforms of common iliac artery suggestive of more proximal disease. Unable to visualize aorta due to patient movement and discomfort. Total occlusion noted in the distal superficial femoral artery. Total occlusion noted in the proximal and mid popliteal artery (reconstitutes in distal popliteal). Total occlusion noted in the distal anterior tibial artery. Total occlusion noted in the peroneal artery. Total occlusion noted in the dorsal pedis artery. Arterial wall calcifications seen throughout lower extremity.  See table(s) above for measurements and observations. Electronically signed by Debby Robertson on 09/21/2024 at 5:12:33 PM.    Final    PERIPHERAL VASCULAR CATHETERIZATION Result Date: 09/21/2024 Images from the original result were not included. Patient name: David King MRN: [id number] DOB: 05-15-1956  Sex: male 09/21/2024 Pre-operative Diagnosis: Right leg ischemia Post-operative diagnosis:  Same Surgeon:  [name] Procedure Performed:  1.  Ultrasound-guided access, left common femoral artery  2.  Selective injection with catheter in the common iliac artery  3.  Ultrasound-guided  access, left brachial artery  4.  Catheter placement into abdominal aorta  5.  Abdominal aortogram  6.  Bilateral leg angiogram  7.  Conscious sedation, 15 minutes Indications: This is a 68 year old gentleman with history of vascular surgery with endarterectomy and iliac stenting and back surgery from a anterior and posterior approach.  He has been complaining of right leg pain.  He is here for further evaluation Procedure:  The patient was identified in the holding area and taken to room 8.  The patient was then placed supine on the table and prepped and draped in the usual sterile fashion.  A time out was called.  Conscious sedation was administered with the use of IV fentanyl  and Versed  under continuous physician and nurse monitoring.  Heart rate, blood pressure, and oxygen  saturation were continuously monitored.  Total sedation time was 50 minutes.  Ultrasound was used to evaluate the left common femoral artery.  It was patent, however there did appear to be thrombus within the common femoral artery.  A digital ultrasound image was acquired.  A micropuncture needle was used to access the left common femoral artery under ultrasound guidance.  An 018 wire was advanced without resistance and a micropuncture sheath was placed.  I then inserted a Bentson wire but met resistance.  I removed the micropuncture sheath and used a Berenstein 2 catheter.  I was confident the wire was in the iliac stents on the left but was concerned that I may be in a dissection within the aorta.  I shot a contrast injection that showed that there was opacification of his stents.  I then shot an aortogram to look like I was in a dissection.  Because the patient did not have palpable femoral pulses I elected to stop.  I held pressure for 10 minutes. Attention was turned towards the left arm.  The left brachial artery was evaluated with ultrasound which was widely patent.  1% lidocaine  was used local anesthesia.  The left brachial artery was  then cannulated under ultrasound guidance with a micropuncture needle.  An 018 wire was inserted followed by placement of micropuncture sheath.  I then placed a 5 French sheath over a Bentson wire.  Heparin  and nitroglycerin  were given through the sheath.  I then advanced a pigtail catheter into the abdominal aorta and aortogram with bilateral runoff was performed.  Aortogram: The suprarenal abdominal aorta is patent.  The visualized portions of the superior mesenteric artery and bilateral renal arteries are widely patent.  There is occlusion of the infrarenal abdominal aorta at the level of the renal arteries.  There does appear to be reconstitution of the common femoral arteries bilaterally.  Right Lower Extremity: The right common femoral profundofemoral artery appeared to be patent.  The superficial femoral artery is patent down to the adductor canal where it occludes.  Distal evaluation was not possible given proximal obstruction  Left Lower Extremity: The left common femoral profundofemoral artery appeared to be patent.  The superficial femoral artery is patent down to the adductor canal where it occludes.  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 [name], M.D., [facility] [place] Office: [phone] Pager:  [phone]   VAS US  LOWER EXTREMITY VENOUS (DVT) Result Date: 09/16/2024  Lower Venous DVT Study Patient Name:  David King  Date of Exam:   09/16/2024 Medical Rec #: [id number]      Accession #:    [id number] Date of Birth: 1956/11/04      Patient Gender: M Patient Age:   27 years Exam Location:  [facility] Procedure:      VAS US  LOWER EXTREMITY VENOUS (DVT) Referring Phys: TORIBIO PITCH  --------------------------------------------------------------------------------  Indications: Swelling, Edema, and Pain.  Comparison Study: No prior exam. Performing Technologist: Edilia Elden Appl  Examination Guidelines: A complete evaluation includes B-mode imaging, spectral Doppler, color Doppler, and power Doppler as needed of all accessible portions of each vessel. Bilateral testing is considered an integral part of a complete examination. Limited examinations for reoccurring indications may be performed as noted. The reflux portion of the exam is performed with the patient in reverse Trendelenburg.  +---------+---------------+---------+-----------+----------+--------------+ RIGHT    CompressibilityPhasicitySpontaneityPropertiesThrombus Aging +---------+---------------+---------+-----------+----------+--------------+ CFV      Full           Yes      Yes                                 +---------+---------------+---------+-----------+----------+--------------+ SFJ      Full           Yes      Yes                                 +---------+---------------+---------+-----------+----------+--------------+ FV Prox  Full                                                        +---------+---------------+---------+-----------+----------+--------------+ FV Mid   Full                                                        +---------+---------------+---------+-----------+----------+--------------+ FV DistalFull                                                        +---------+---------------+---------+-----------+----------+--------------+ PFV      Full                                                        +---------+---------------+---------+-----------+----------+--------------+ POP      Full           Yes      Yes                                 +---------+---------------+---------+-----------+----------+--------------+  PTV      Full                                                         +---------+---------------+---------+-----------+----------+--------------+ PERO     Full                                                        +---------+---------------+---------+-----------+----------+--------------+   +---------+---------------+---------+-----------+----------+--------------+ LEFT     CompressibilityPhasicitySpontaneityPropertiesThrombus Aging +---------+---------------+---------+-----------+----------+--------------+ CFV      Full           Yes      Yes                                 +---------+---------------+---------+-----------+----------+--------------+ SFJ      Full           Yes      Yes                                 +---------+---------------+---------+-----------+----------+--------------+ FV Prox  Full                                                        +---------+---------------+---------+-----------+----------+--------------+ FV Mid   Full                                                        +---------+---------------+---------+-----------+----------+--------------+ FV DistalFull                                                        +---------+---------------+---------+-----------+----------+--------------+ PFV      Full                                                        +---------+---------------+---------+-----------+----------+--------------+ POP      Full           Yes      Yes                                 +---------+---------------+---------+-----------+----------+--------------+ PTV      Full                                                        +---------+---------------+---------+-----------+----------+--------------+  PERO     Full                                                        +---------+---------------+---------+-----------+----------+--------------+     Summary: BILATERAL: - No evidence of deep vein thrombosis seen in the lower  extremities, bilaterally. -No evidence of popliteal cyst, bilaterally.   *See table(s) above for measurements and observations. Electronically signed by Debby Robertson on 09/16/2024 at 4:04:02 PM.    Final    MR BRAIN WO CONTRAST Result Date: 09/14/2024 CLINICAL DATA:  Right leg weakness and numbness EXAM: MRI HEAD WITHOUT CONTRAST TECHNIQUE: Multiplanar, multiecho pulse sequences of the brain and surrounding structures were obtained without intravenous contrast. COMPARISON:  None Available. FINDINGS: MRI brain: The brain volume is normal. The signal in the brain parenchyma is normal. There is no acute or chronic infarct. The ventricles are normal. No mass lesion. There are normal flow signals in the carotid arteries and basilar artery. No significant bone marrow signal abnormality. Incidental left mastoid effusion. IMPRESSION: No acute infarct or other significant abnormality Electronically Signed   By: Nancyann Burns M.D.   On: 09/14/2024 10:33   MR Lumbar Spine W Wo Contrast Result Date: 09/14/2024 CLINICAL DATA:  Low back pain, prior surgery, new symptoms EXAM: MRI LUMBAR SPINE WITHOUT AND WITH CONTRAST TECHNIQUE: Multiplanar and multiecho pulse sequences of the lumbar spine were obtained without and with intravenous contrast. CONTRAST:  9mL GADAVIST  GADOBUTROL  1 MMOL/ML IV SOLN COMPARISON:  None Available. FINDINGS: Segmentation:  Standard. Alignment:  Normal. Vertebrae: No fracture, evidence of discitis, or bone lesion. L5-S1 interbody fusion. L4-L5 PLIF. Conus medullaris and cauda equina: Conus extends to the L1-L2 level. Conus and cauda equina appear normal. Paraspinal and other soft tissues: Partially imaged left renal cyst. Disc levels: T12-L1: No significant disc protrusion, foraminal stenosis, or canal stenosis. L1-L2: No significant disc protrusion, foraminal stenosis, or canal stenosis. L2-L3: Right eccentric disc bulging. Mild right foraminal stenosis. Mild right subarticular recess stenosis without  significant central canal stenosis. L3-L4: Disc bulging, facet arthropathy and ligamentum thickening. Resulting mild canal stenosis and mild bilateral subarticular recess stenosis. Moderate left and mild right foraminal stenosis. L4-L5: PLIF with metallic artifact that limits assessment but there appears to be improved left foraminal stenosis with some enhancing granulation tissue in the left foramen and probably at least moderate foraminal stenosis. Right foramen and canal are patent. L5-S1: Interbody fusion. Endplate spurring contributes to moderate left and mild right foraminal stenosis. Patent canal. IMPRESSION: 1. At L4-L5,PLIF with metallic artifact that limits assessment but there appears to be improved left foraminal stenosis with probably at least moderate residual foraminal stenosis. 2. At L5-S1, similar moderate left and mild right foraminal stenosis. Electronically Signed   By: Gilmore GORMAN Molt M.D.   On: 09/14/2024 00:52   MR THORACIC SPINE WO CONTRAST Result Date: 09/13/2024 EXAM: MRI THORACIC SPINE WITHOUT INTRAVENOUS CONTRAST 09/13/2024 01:54:07 PM TECHNIQUE: Multiplanar multisequence MRI of the thoracic spine was performed without the administration of intravenous contrast. COMPARISON: None available. CLINICAL HISTORY: Mid-back pain. FINDINGS: BONES AND ALIGNMENT: There is straightening of the normal thoracic kyphosis. Chronic compression deformity of T7 with up to 50% height loss in the anterior and central aspect of the vertebral body with findings suggestive of kyphoplasty. There is mild anterior wedging of the T11 and T12  vertebral bodies without edema, which may be physiologic versus sequelae of mild trauma. Chronic compression deformity. The vertebral body heights are otherwise maintained. There is no evidence of acute fracture in the thoracic spine. SPINAL CORD: Normal spinal cord volume. No cord compression or cord signal abnormality. SOFT TISSUES: Unremarkable. DEGENERATIVE CHANGES:  There are degenerative changes at the costovertebral articulations at multiple levels. There is no evidence of large disc herniation or high-grade spinal canal stenosis in the thoracic spine. There is facet arthrosis at multiple levels in the thoracic spine. Mild foraminal stenosis on the left at T6-7. Moderate right and mild left foraminal stenosis at T10-11. IMPRESSION: 1. No acute findings. 2. 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 3. Mild anterior wedging of the T11 and T12 vertebral bodies without edema, which may be physiologic versus sequelae of mild chronic compression deformity. 4. Degenerative changes as above. Mild foraminal stenosis on the left at T6-7 and moderate right and mild left foraminal stenosis at T10-11. Electronically signed by: Donnice Mania MD 09/13/2024 02:39 PM EDT RP Workstation: [id number]    Labs:  Basic Metabolic Panel: Recent Labs  Lab 10/07/24 2206 10/08/24 0353  NA 134* 132*  K 4.5 3.8  CL 96* 99  CO2 27 22  GLUCOSE 122* 102*  BUN 35* 28*  CREATININE 1.49* 1.11  CALCIUM  8.7* 7.7*  MG 2.2  --   PHOS 3.3  --     CBC: Recent Labs  Lab 10/07/24 0313 10/07/24 2206 10/08/24 0353 10/09/24 0410 10/10/24 0300 10/11/24 0836  WBC 14.6*   < > 14.3* 12.0* 12.0* 12.6*  NEUTROABS 11.3*  --  10.2*  --   --   --   HGB 9.2*   < > 7.2* 7.7* 7.7* 8.9*  HCT 28.3*   < > 22.5* 23.6* 24.1* 27.8*  MCV 95.3   < > 96.6 92.5 92.3 92.4  PLT 266   < > 202 161 162 181   < > = values in this interval not displayed.    CBG: No results for input(s): GLUCAP in the last 168 hours.  Family history.  Father with prostate cancer.  Denies any esophageal cancer or rectal cancer or stomach cancer  Brief HPI:   David King is a 68 y.o. right handed male with history significant for anxiety, migraine headaches, detached retina left eye, hypertension, prostate cancer status post radical prostatectomy 2014, COPD/quit smoking 7 years  ago as well as history of left common femoral endarterectomy with bilateral common iliac artery stenting and left external iliac artery stenting November 2023 at [facility].  Per chart review patient lives with spouse.  1 level home 4 steps to entry.  Modified independent with a cane prior to admission.  Presented 09/08/2024 with low back pain radiating to the left lower extremity.  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  Underwent left side L4-5 transforaminal lumbar interbody fusion posterior lateral fusion insertion of interbody device revision L4-5 decompression 09/08/2024 per Dr. Beuford.  Noted intraoperative patient had developed hives swelling of tongue and lips along with hypertension and critical care medicine consulted possible anaphylactic shock was started on epi drip and given Decadron .  He did require short-term intubation and was transferred to the ICU and extubated 9/11.  MRI thoracic spine no acute findings.  MRI lumbar spine showed L4-5 PLIF moderate residual foraminal stenosis.  Since his surgery patient ongoing right leg weakness numbness  neurology consulted for recommendations.  MRI of the brain negative.  Neurology felt these findings consistent with hypersensitivity to pain or mild component of nonorganic findings.  Recommendations at that time were for outpatient EMG of right lower extremity if numbness persisted.  Therapy evaluations completed back brace when out of bed.  Patient was admitted to inpatient rehab services 09/15/2024 for comprehensive rehab therapies.  During patient's rehab hospitalization noted persistent pain and numbness of the right lower extremity earlier hospital course workup by neurology was unremarkable at that time.  Vascular surgery consulted 09/19/2024 for persistent leg pain.  ABIs were not able to be done due to patient's level of pain.  Monitoring of CK levels ranging from 3886-1434.   Diagnostic angiography completed 09/21/2024 per Dr. 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  CT angiography of the chest abdomen and pelvis completed showing stable infrarenal aortic aneurysm.  Due to patient's aortic occlusion findings he was discharged to acute care services 09/24/2024 and underwent right axillary bifemoral bypass graft with bilateral common femoral superficial femoral and profundofemoral thrombectomy with redo of left common femoral artery exposure and 4 compartment right leg fasciotomy resection of necrotic muscle in the right anterior compartment with bilateral femoral enterectomy 09/24/2024 per Dr. Serene.  Patient did return to the OR multiple times for washout as well as initial popliteal signal being different than it was initially in the operating room 01/07/2025 appeared to be monophasic thus returned back for reexploration angiography to make sure blood flow optimized angiography revealed a filling defect in the popliteal artery at that level of the knee.  There was reconstitution of the posterior tibial artery which was dominant runoff.  Peroneal artery also opacified.  It was elected to reopen the medial right below knee incision and dissected out the popliteal artery.  Patient tolerated procedure well was sent back to the ICU IV heparin  initiated for arterial insufficiency.  Patient did require right calf lateral fasciotomy washout and debridement of skin and soft tissue vacuum-assisted dressing 09/27/2024 per Dr. Fonda Simpers followed by excisional debridement of skin soft tissue muscle and fascia anterior compartment right leg.  Transfer of the lateral compartment muscles to the anterior compartment to cover the anterior tibial vessels 09/29/2024 per Dr. Harden and ultimately with right leg fasciotomy washout again completed 10/01/2024 and later completed again with excisional debridement right calf and excision of soft tissue muscle fascia application of wound VAC 10/06/2024 per Dr.  Harden.  Patient did have episodes of ongoing hypotension 10/10 in order of 1 unit packed red blood cells for hemoglobin of 7.2 latest hemoglobin 7.7.  Hospital course remain on doxycycline for wound coverage.  IV heparin  transitioned to Eliquis.  Therapy evaluations completed patient was readmitted back to inpatient rehab services for comprehensive therapies.   Hospital Course: David King was admitted to rehab 10/11/2024 for inpatient therapies to consist of PT, ST and OT at least three hours five days a week. Past admission physiatrist, therapy team and rehab RN have worked together to provide customized collaborative inpatient rehab.  Pertaining to patient's lumbar radiculopathy large disc herniation spinal stenosis status post L4-5 transforaminal interbody fusion posterior lateral fusion insertion of interbody device L4-5 decompression 09/08/2024 per Dr. Beuford.  Back brace when out of bed.  Surgical site healing nicely Patient remained on IV heparin  transition to Eliquis 10/11/2024 for DVT prophylaxis. Pain managed with use of Lyrica  and weaned from OxyContin  sustained-release and immediate release oxycodone . Mood stabilization with Cymbalta  as well as Celexa  patient  was attending full therapies emotional support provided. PAD/compartment syndrome right leg ischemia status post thrombectomy.  Status post right leg anterior compartment fasciotomy 9/26 multiple washouts as well as excisional debridement wound VAC.  Patient completed course of doxycycline followed by vascular surgery as well as Dr. Harden with wound VAC as indicated and discontinued. Acute blood loss anemia stable monitoring for any bleeding episodes AKI.  Resolved after IV hydration follow-up chemistries Hyperlipidemia with Crestor  as indicated COPD/remote tobacco use monitoring of oxygen  saturations no increasing shortness of breath and remained on inhaler History of prostate cancer/urinary retention and remained on Urecholine.   History of prostatectomy.  External catheter initially in place maintained on Flomax  monitoring of PVRs Patient did have history of detached retina left eye follow-up outpatient.   Blood pressures were monitored on TID basis and remained soft and monitored     Rehab course: During patient's stay in rehab weekly team conferences were held to monitor patient's progress, set goals and discuss barriers to dis  Media Information   Media Information   Media Information   Media Information   Document Information  Photos  Left elbow  10/11/2024 17:57  Attached To:  Hospital Encounter on 10/11/24  Source Information  Lenis Geralynn ORN, RN  [facility]    Document Information  Photos  Right leg incision  10/11/2024 16:42  Attached To:  Hospital Encounter on 10/11/24  Source Information  Lenis Geralynn ORN, RN  [facility]    Document Information  Photos  Left great toe  10/11/2024 16:30  Attached To:  Hospital Encounter on 10/11/24  Source Information  Lenis Geralynn ORN, RN  [facility]    Document Information  Photos  Right heel  10/11/2024 16:30  Attached To:  Hospital Encounter on 10/11/24  Source Information  Nida, Geralynn ORN, RN  [facility]  charge. At admission, patient required max assist for feet rolling walker moderate assist squat pivot transfers  He/She  has had improvement in activity tolerance, balance, postural control as well as ability to compensate for deficits. He/She has had improvement in functional use RUE/LUE  and RLE/LLE as well as improvement in awareness.  Working with energy conservation techniques.  Patient with steady progress.  Ambulates 30 feet navigating 16 stairs x 4 with bilateral handrails contact-guard assist overall.  Return to room perform amatory transfer to bed with contact-guard, bed mobility with supervision.  Standing activities at window placing an removing objects with rest breaks as needed.   Patient stood at table to clean all items using bilateral upper extremities.  Transition to ADL apartment for home safety and education and practice removing items from cabinets with contact-guard.       Disposition:  There are no questions and answers to display.         Diet: Regular  Special Instructions: No driving smoking or alcohol  Back brace when out of bed  Medications at discharge. 1.  Tylenol  as needed 2.  Eliquis 5 mg p.o. twice daily 3.  Aspirin  81 mg p.o. daily 4.  Vitamin C  500 mg p.o. daily 5.Breztri  160 - 9 - 4 0.8 mcg 2 puffs twice daily 6.  Celexa  20 mg p.o. daily 7.  Cymbalta  30 mg p.o. nightly 8.  Advil  400 mg every 4 hours as needed moderate pain 9.  Multivitamin daily 10.  Urecholine 10 mg 3 times daily 11.  Vitamin D 1000 units daily 12.  MiraLAX  daily  hold for loose stools 13.  Lyrica  150 mg p.o. 3 times daily 14.  Crestor  20 mg p.o. daily 15.  Flomax  0.4 mg daily after supper 16.  Thiamine 100 mg p.o. daily 17.  Senokot S1 tablet nightly 18.  Ambien  5 mg nightly as needed sleep 19.  ProAmatine 10 mg 3 times daily 20.  Gerhart Butt cream twice daily     30-35 minutes were spent completing discharge summary and discharge planning     Follow-up Information     Lovorn, Megan, MD Follow up.   Specialty: Physical Medicine and Rehabilitation Why: Office to call for appointment Contact information: [address] [place] [address] [phone]         Beuford Anes, MD Follow up.   Specialty: Orthopedic Surgery Why: Call for appointment Contact information: [address] [place] [address] [phone]         [name], MD Follow up.   Specialty: Family Medicine Why: Call for appointment Contact information: [address] [place] [address] [phone]         Harden Jerona GAILS, MD Follow up.   Specialty: Orthopedic Surgery Why: Call for appointment Contact  information: [address] [place]  [place] [address] [phone]         Serene Gaile ORN, MD Follow up.   Specialties: Vascular Surgery, Cardiology Why: Call for appointment Contact information: [address] [place] [address] [phone]                 Signed: Toribio JINNY Pitch 10/12/2024, 4:40 AM

## 2024-10-12 NOTE — Evaluation (Signed)
 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 Active Problems:   Hx of fasciotomy   Lumbar radiculopathy   Protein-calorie malnutrition, severe   Past Medical History:  Past Medical History:  Diagnosis Date   Anxiety    new dx   Arthritis    lumbar   Atherosclerotic vascular disease    Cataract    COPD (chronic obstructive pulmonary disease) (HCC)    Diverticulosis    Elevated PSA    Headache(784.0)    MIGRAINES   Hypertension 2021   Iritis    CHRONIC IN LEFT EYE - SOME VISIAL IMPAIRMENT IN LEFT EYE   Neuromuscular disorder (HCC)    left leg/foot,pinched siactic nerve   Pain    PAIN LEFT HIP AND DOWN LT LEG WITH NUMBNESS IN LEFT LEG--PT STATES SCIATIC NERVE IMPINGEMENT - PT PLANS BACK IN THE NEAR SURGERY.   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   Renal cysts, acquired, bilateral 03/19/2013   several simple , CT   Urinary frequency    AND NOCTURIA   Past Surgical History:  Past Surgical History:  Procedure Laterality Date   ABDOMINAL AORTOGRAM W/LOWER EXTREMITY N/A 09/21/2024   Procedure: ABDOMINAL AORTOGRAM W/LOWER EXTREMITY;  Surgeon: Serene Gaile ORN, MD;  [facility]: [facility];  Service: Cardiovascular;  Laterality: N/A;   APPLICATION OF WOUND VAC Right 09/27/2024   Procedure: APPLICATION, WOUND VAC RIGHT LOWER EXTREMITY;  Surgeon: Lanis Fonda BRAVO, MD;  Location: [facility] OR;  Service: Vascular;  Laterality: Right;   APPLICATION OF WOUND VAC Right 10/01/2024   Procedure:  APPLICATION, WOUND VAC;  Surgeon: Lanis Fonda BRAVO, MD;  Location: [facility] OR;  Service: Vascular;  Laterality: Right;   APPLICATION, SKIN SUBSTITUTE Right 09/27/2024   Procedure: APPLICATION, [place]AD SKIN SUBSTITUTE;  Surgeon: Lanis Fonda BRAVO, MD;  Location: [facility] OR;  Service: Vascular;  Laterality: Right;   APPLICATION, SKIN SUBSTITUTE Right 10/01/2024   Procedure: APPLICATION, SKIN SUBSTITUTE KERECIS 95SQ CM;  Surgeon: Lanis Fonda BRAVO, MD;  Location: [facility] OR;  Service: Vascular;  Laterality: Right;   AXILLARY-FEMORAL BYPASS GRAFT Right 09/24/2024   Procedure: CREATION, BYPASS, ARTERIAL, AXILLARY TO BILATERAL FEMORAL, USING PROPATEN X 80CM X 2 GRAFT;  Surgeon: Serene Gaile ORN, MD;  Location: [facility] OR;  Service: Vascular;  Laterality: Right;   BACK SURGERY     CARDIAC CATHETERIZATION  03/10/2018   CATARACT EXTRACTION W/ INTRAOCULAR LENS IMPLANT Left    ENDARTERECTOMY FEMORAL Bilateral 09/24/2024   Procedure: BILATERAL FEMORAL ENDARTERECTOMY;  Surgeon: Serene Gaile ORN, MD;  [facility]: [facility] OR;  Service: Vascular;  Laterality: Bilateral;   EYE SURGERY     RETINAL SURGERY LEFT EYE   FASCIOTOMY Right 09/24/2024   Procedure: RIGHT LOWER LEG FOUR COMPARTMENT FASCIOTOMY WITH RESECTION OF TIBIALIS ANTERIOR;  Surgeon: Serene Gaile ORN, MD;  [facility]: [facility];  Service: Vascular;  Laterality: Right;   FASCIOTOMY CLOSURE Right 09/27/2024   Procedure: RIGHT LOWER EXTREMITY FASCIOTOMY WASHOUT;  Surgeon: Lanis Fonda BRAVO, MD;  Location: [facility] OR;  Service: Vascular;  Laterality: Right;  RIGHT LEG CLOSURE  HYDROCELE EXCISION Left 03/05/2013   Procedure: HYDROCELECTOMY ADULT;  Surgeon: Donnice Gwenyth Brooks, MD;  Location: [facility];  Service: Urology;  Laterality: Left;   INCISION AND DRAINAGE OF DEEP ABSCESS, CALF Right 09/29/2024   Procedure: INCISION AND DRAINAGE OF DEEP ABSCESS, CALF;  Surgeon: Harden Jerona GAILS, MD;  [facility];  Service: Orthopedics;  Laterality: Right;   INCISION AND DRAINAGE OF  DEEP ABSCESS, CALF Right 10/06/2024   Procedure: INCISION AND DRAINAGE OF DEEP ABSCESS, CALF RIGHT;  Surgeon: Harden Jerona GAILS, MD;  [facility];  Service: Orthopedics;  Laterality: Right;  DEBRIDEMENT RIGHT LEG   INCISION AND DRAINAGE OF WOUND Right 10/01/2024   Procedure: IRRIGATION AND DEBRIDEMENT WOUND;  Surgeon: Lanis Fonda BRAVO, MD;  Location: [facility] OR;  Service: Vascular;  Laterality: Right;   INCISIONAL HERNIA REPAIR N/A 06/18/2022   Procedure: REPAIR OF INTERNAL HERNIA;  Surgeon: Rubin Calamity, MD;  Location: [facility] OR;  Service: General;  Laterality: N/A;   INGUINAL HERNIA REPAIR Bilateral AGE 49   INSERTION OF MESH N/A 06/11/2022   Procedure: INSERTION OF MESH;  Surgeon: Rubin Calamity, MD;  Location: [facility] OR;  Service: General;  Laterality: N/A;   INSERTION OF MESH N/A 06/18/2022   Procedure: INSERTION OF MESH;  Surgeon: Rubin Calamity, MD;  Location: [facility] OR;  Service: General;  Laterality: N/A;   KYPHOPLASTY N/A 05/08/2023   Procedure: THORACIC SEVEN KYPHOPLASTY;  Surgeon: Beuford Anes, MD;  [facility];  Service: Orthopedics;  Laterality: N/A;   LAPAROSCOPY N/A 06/18/2022   Procedure: LAPAROSCOPY DIAGNOSTIC;  Surgeon: Rubin Calamity, MD;  Location: [facility] OR;  Service: General;  Laterality: N/A;   LOWER EXTREMITY ANGIOGRAM Right 09/24/2024   Procedure: RIGHT LOWER EXTREMITY ANGIOGRAM AND RIGHT POPLETEAL THROMBECTOMY;  Surgeon: Serene Gaile ORN, MD;  [facility];  Service: Vascular;  Laterality: Right;   LYMPHADENECTOMY Bilateral 05/06/2013   Procedure: REDGIE;  Surgeon: Noretta Ferrara, MD;  Location: [facility] ORS;  Service: Urology;  Laterality: Bilateral;   PROSTATE BIOPSY N/A 03/05/2013   Procedure: PROSTATE BIOPSY and ultrasound;  Surgeon: Donnice Gwenyth Brooks, MD;  Location: [facility];  Service: Urology;  Laterality: N/A;   REMOVAL BURSA SAC, LEFT ELBOW  1996   ROBOT ASSISTED LAPAROSCOPIC RADICAL PROSTATECTOMY N/A 05/06/2013   Procedure: ROBOTIC ASSISTED  LAPAROSCOPIC RADICAL PROSTATECTOMY LEVEL 3;  Surgeon: Noretta Ferrara, MD;  Location: [facility] ORS;  Service: Urology;  Laterality: N/A;   THROMBECTOMY FEMORAL ARTERY Bilateral 09/24/2024   Procedure: BILATERAL FEMORAL ARTERY THROMBECTOMY;  Surgeon: Serene Gaile ORN, MD;  Location: [facility];  Service: Vascular;  Laterality: Bilateral;   TONSILLECTOMY     as achild   TRANSFORAMINAL LUMBAR INTERBODY FUSION (TLIF) WITH PEDICLE SCREW FIXATION 1 LEVEL Left 09/08/2024   Procedure: LEFT-SIDED LUMBAR 4- LUMBAR 5 TRANSFORAMINAL LUMBAR INTERBODY FUSION AND DECOMPRESSION WITH INSTRUMENTATION AND ALLOGRAFT;  Surgeon: Beuford Anes, MD;  Location: [facility] OR;  Service: Orthopedics;  Laterality: Left;  LEFT-SIDED LUMBAR 4- LUMBAR 5 TRANSFORAMINAL LUMBAR INTERBODY FUSION AND DECOMPRESSION WITH INSTRUMENTATION AND ALLOGRAFT   XI ROBOTIC ASSISTED VENTRAL HERNIA N/A 06/11/2022   Procedure: ROBOTIC INCISIONAL HERNIA REPAIR WITH MESH;  Surgeon: Rubin Calamity, MD;  Location: [facility] OR;  Service: General;  Laterality: N/A;    Assessment & Plan Clinical Impression: David David is a 68 year old right handed male with history significant for anxiety, migraine headaches, detached retina left eye, hypertension, prostate cancer status post radical prostatectomy 2014, COPD/quit smoking 7 years ago as well as history of left common  femoral enterectomy with bilateral common iliac artery stenting and left external iliac artery stenting November 2023 at [facility]. Per chart review patient lives with spouse. 1 level home 4 steps to entry. Modified independent with a cane prior to admission. Presented 09/08/2024 with low back pain radiating to the left lower extremity. 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 Underwent left-sided L4-5 transforaminal lumbar interbody fusion/posterior lateral fusion insertion of interbody device revision L4-5 decompression 09/08/2024 per Dr. Beuford.  Noted intraoperatively patient had developed hives swelling of tongue and lips along with hypotension with critical care medicine consulted possible anaphylactic shock and was started on epi drip and given Decadron . He did require short-term intubation was transferred to the ICU and extubated 9/11. MRI T-spine with no acute findings. MRI lumbar spine showed L4-5 PLIF moderate residual foraminal stenosis. Since his surgery patient had ongoing right leg weakness and numbness neurology consulted for recommendations. MRI of the brain showed no acute changes. Neurology felt these findings consistent with either hypersensitivity to pain or mild component of nonorganic findings. Recommendations at that time were for outpatient EMG of right lower extremity numbness persisted. Therapy evaluations completed with back brace when out of bed. Therapy evaluations completed and patient was admitted to inpatient rehab services 09/15/2024 for comprehensive rehab therapies. During patient's hospitalization noted persistent pain and numbness of the right lower extremity earlier hospital course workup by neurology was unremarkable at that time. Vascular surgery Dr. Penne Colorado consulted 09/19/2024 for persistent leg pain. ABIs were not able to be done due to patient's level of pain. Monitoring of CK levels ranging from 3886-1434. Diagnostic angiography completed 09/21/2024 per Dr. 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 CT angiography chest abdomen and pelvis completed showing stable infrarenal aortic aneurysm. Due to patient's aortic occlusion findings he was discharged to acute care services 09/24/2024 and underwent right axillary bifemoral bypass graft with bilateral common femoral, superficial femoral and profundofemoral thrombectomy with redo of left common femoral artery exposure and 4 compartment right leg fasciotomy resection of necrotic muscle in the right anterior compartment with bilateral femoral enterectomy 09/24/2024 per  Dr. Serene. Patient did return to the OR same day 09/24/2024 due to popliteal signal being different than what it was initially in the operating room it appeared to be monophasic thus returned back for reexploration angiography to make sure blood flow is optimized and angiography revealed a filling defect in the popliteal artery at the level of the knee. There was reconstitution of the posterior tibial artery which was dominant runoff. Peroneal artery also opacified. It was elected to reopen the medial right below-knee incision and dissected out the popliteal artery. Patient tolerated the procedure well and was sent back to the ICU. IV heparin  was initiated for arterial insufficiency. Patient did require right calf lateral fasciotomy washout debridement of skin and soft tissue vacuum assisted dressing 09/27/2024 per vascular surgery Dr. Fonda Simpers followed by excisional debridement skin soft tissue muscle and fascia anterior compartment right leg. Transfer of the lateral compartment muscles to the anterior compartment to cover the anterior tibial vessels 09/29/2024 per Dr. Harden and ultimately with right leg fasciotomy washout again completed 10/01/2024 and later completed again with excisional debridement right calf with excision of soft tissue muscle and fascia application of wound VAC 10/06/2024 per Dr. Harden.... Patient did require episodes of ongoing hypotension 10/10 order of 1 unit packed red blood cells for hemoglobin of 7.2 latest hemoglobin 7.7. Hospital course patient remains on doxycycline  for wound coverage. IV heparin  ongoing he was cleared to resume low-dose aspirin . Therapy evaluations have been completed and ongoing with slow progressive gains. Patient was readmitted back to inpatient rehab services for comprehensive therapies.  Patient transferred to CIR on 10/11/2024 .   Patient currently requires max with mobility secondary to muscle weakness, decreased cardiorespiratoy endurance, and decreased  sitting balance, decreased standing balance, and decreased balance strategies.  Prior to hospitalization, patient was modified independent  with mobility and lived with Spouse in a House home.  Home access is 4Stairs to enter.  Patient will benefit from skilled PT intervention to maximize safe functional mobility, minimize fall risk, and decrease caregiver burden for planned discharge home with 24 hour assist.  Anticipate patient will benefit from follow up [facility] at discharge.  PT - End of Session Activity Tolerance: Decreased this session;Tolerates < 10 min activity, no significant change in vital signs Endurance Deficit: Yes PT Assessment Rehab Potential (ACUTE/IP ONLY): Fair PT Barriers to Discharge: None PT Patient demonstrates impairments in the following area(s): Balance;Pain;Safety;Edema;Endurance;Sensory;Skin Integrity;Motor PT Transfers Functional Problem(s): Bed Mobility;Bed to Chair;Car PT Locomotion Functional Problem(s): Ambulation;Wheelchair Mobility PT Plan PT Intensity: Minimum of 1-2 x/day ,45 to 90 minutes PT Frequency: 5 out of 7 days PT Duration Estimated Length of Stay: 2-3 weeks PT Treatment/Interventions: Ambulation/gait training;Discharge planning;Functional mobility training;Psychosocial support;Therapeutic Activities;Wheelchair propulsion/positioning;Therapeutic Exercise;Skin care/wound management;Neuromuscular re-education;Disease management/prevention;Balance/vestibular training;DME/adaptive equipment instruction;Pain management;Splinting/orthotics;Community reintegration;Functional electrical stimulation;UE/LE Strength taining/ROM;Patient/family education;Stair training;UE/LE Coordination activities;Cognitive remediation/compensation PT Transfers Anticipated Outcome(s): min a with LRAD PT Locomotion Anticipated Outcome(s): supervision w/c PT Recommendation Recommendations for Other Services: Neuropsych consult;Therapeutic Recreation consult Therapeutic Recreation  Interventions: Stress management;Outing/community reintergration Follow Up Recommendations: Home health PT Patient destination: Home Equipment Recommended: To be determined   PT Evaluation Precautions/Restrictions Precautions Precautions: Fall;Back Precaution Booklet Issued: Yes (comment) Recall of Precautions/Restrictions: Impaired Precaution/Restrictions Comments: watch BP (orthostatic); RLE foot drop, wound VAC Required Braces or Orthoses: Spinal Brace Spinal Brace: Thoracolumbosacral orthotic;Applied in sitting position Restrictions Weight Bearing Restrictions Per Provider Order: Yes RLE Weight Bearing Per Provider Order: Weight bearing as tolerated Other Position/Activity Restrictions: transfers only on RLE General   Vital Signs  Pain Pain Assessment Pain Scale: 0-10 Pain Score: 8  Faces Pain Scale: Hurts whole lot Pain Type: Surgical pain;Acute pain Pain Location: Calf Pain Orientation: Right Pain Descriptors / Indicators: Constant Pain Onset: On-going Patients Stated Pain Goal: 0 Pain Intervention(s): Medication (See eMAR);Repositioned Pain Interference   Home Living/Prior Functioning Home Living Available Help at Discharge: Family;Available 24 hours/day Type of Home: House Home Access: Stairs to enter [employer] of Steps: 4 Entrance Stairs-Rails: Can reach both Home Layout: Two level;Able to live on main level with bedroom/bathroom Bathroom Shower/Tub: Health visitor: Handicapped height Bathroom Accessibility: Yes Additional Comments: bench in shower HH showerhead, grab bars in shower  Lives With: Spouse Prior Function Level of Independence: Independent with gait;Independent with transfers;Requires assistive device for independence  Able to Take Stairs?: Yes Driving: Yes Vocation: Retired Vision/Perception  Vision - History Ability to See in Adequate Light: 0 Adequate Vision - Assessment Additional Comments: detached retina,  some blurred vision per patient report. Perception Perception: Within Functional Limits Praxis Praxis: WFL  Cognition Overall Cognitive Status: Impaired/Different from baseline Orientation Level: Oriented X4 Memory: Impaired Memory Impairment: Retrieval deficit;Decreased recall of new information;Decreased short term memory Decreased Short Term Memory: Verbal basic Awareness: Impaired Problem Solving: Impaired Safety/Judgment: Impaired Sensation Sensation Light Touch: Impaired by gross assessment Hot/Cold: Appears Intact Proprioception: Impaired by gross assessment Stereognosis: Not tested  Additional Comments: absent sensation below fasciotomy on RLE, LLE intact Coordination Gross Motor Movements are Fluid and Coordinated: No Fine Motor Movements are Fluid and Coordinated: No Coordination and Movement Description: increased BUE/hand tremor Motor  Motor Motor: Abnormal tone Motor - Skilled Clinical Observations: unable to DF right foot   Trunk/Postural Assessment  Cervical Assessment Cervical Assessment: Within Functional Limits Thoracic Assessment Thoracic Assessment: Within Functional Limits Lumbar Assessment Lumbar Assessment: Exceptions to [facility] (back precautions) Postural Control Postural Control: Deficits on evaluation  Balance Balance Balance Assessed: Yes Static Sitting Balance Static Sitting - Balance Support: Bilateral upper extremity supported;Feet supported Static Sitting - Level of Assistance: 4: Min assist Dynamic Sitting Balance Dynamic Sitting - Balance Support: Feet supported;Bilateral upper extremity supported Dynamic Sitting - Level of Assistance: 4: Min assist Extremity Assessment  RUE Assessment Passive Range of Motion (PROM) Comments: WFL General Strength Comments: 4/5 LUE Assessment Passive Range of Motion (PROM) Comments: WFL General Strength Comments: 4/5 RLE Assessment RLE Assessment: Exceptions to [facility] RLE Strength Right Hip Flexion:  3/5 Right Hip Extension: 3/5 Right Knee Flexion: 3/5 Right Knee Extension: 3/5 Right Ankle Dorsiflexion: 0/5 Right Ankle Plantar Flexion: 0/5 LLE Assessment LLE Assessment: Exceptions to [facility] General Strength Comments: grossly 3+/5  Care Tool Care Tool Bed Mobility Roll left and right activity   Roll left and right assist level: Minimal Assistance - Patient > 75%    Sit to lying activity   Sit to lying assist level: Maximal Assistance - Patient 25 - 49%    Lying to sitting on side of bed activity   Lying to sitting on side of bed assist level: the ability to move from lying on the back to sitting on the side of the bed with no back support.: Contact Guard/Touching assist     Care Tool Transfers Sit to stand transfer Sit to stand activity did not occur: Safety/medical concerns Sit to stand assist level: 2 Helpers    Chair/bed transfer Chair/bed transfer activity did not occur: Safety/medical concerns      Licensed conveyancer transfer activity did not occur: Safety/medical concerns        Care Tool Locomotion Ambulation Ambulation activity did not occur: Safety/medical concerns        Walk 10 feet activity Walk 10 feet activity did not occur: Safety/medical concerns       Walk 50 feet with 2 turns activity Walk 50 feet with 2 turns activity did not occur: Safety/medical concerns      Walk 150 feet activity Walk 150 feet activity did not occur: Safety/medical concerns      Walk 10 feet on uneven surfaces activity Walk 10 feet on uneven surfaces activity did not occur: Safety/medical concerns      Stairs Stair activity did not occur: Safety/medical concerns        Walk up/down 1 step activity Walk up/down 1 step or curb (drop down) activity did not occur: Safety/medical concerns      Walk up/down 4 steps activity Walk up/down 4 steps activity did not occur: Safety/medical concerns      Walk up/down 12 steps activity Walk up/down 12 steps activity did not occur:  Safety/medical concerns      Pick up small objects from floor Pick up small object from the floor (from standing position) activity did not occur: Safety/medical concerns      Wheelchair Is the patient using a wheelchair?: Yes Type of Wheelchair: Manual Wheelchair activity did not occur: Safety/medical concerns  Wheel 50 feet with 2 turns activity Wheelchair 50 feet with 2 turns activity did not occur: Safety/medical concerns    Wheel 150 feet activity Wheelchair 150 feet activity did not occur: Safety/medical concerns      Refer to Care Plan for Long Term Goals  SHORT TERM GOAL WEEK 1 PT Short Term Goal 1 (Week 1): Pt will tolerate sitting upright >2 hours PT Short Term Goal 2 (Week 1): Pt will perform stedy transfers with mod a or better PT Short Term Goal 3 (Week 1): Pt will consistently perform supine<>sit with CGA  Recommendations for other services: Neuropsych and Therapeutic Recreation  Pet therapy and Stress management  Skilled Therapeutic Intervention Evaluation completed (see details above) with patient education regarding purpose of PT evaluation, PT POC and goals, therapy schedule, weekly team meetings, and other CIR information including safety plan and fall risk safety. Pt reports 8-9/10 pain, premedicated. Rest and positioning provided as needed.  Pt performed the below functional mobility tasks with the specified levels of skilled cuing and assistance. Eval was extremely limited by orthostatic hypotension. Vitals listed below. Pt returned to bed after session, left sitting with HOB elevated to improve upright tolerance.   Vitals: Supine: 96/70(80) Supine with LLE ted: 105/72 (84) HR=102 Seated EOB: 81/67 (71), HR=111, *pt symptomatic and with whole body tremor* Partially reclined: 90/79 (84), HR=99, pt asymptomatic   Mobility Bed Mobility Bed Mobility: Rolling Left;Rolling Right Rolling Right: Minimal Assistance - Patient > 75% Rolling Left: Minimal  Assistance - Patient > 75% Supine to Sit: Contact Guard/Touching assist Transfers Sit to Stand: 2 Helpers;Dependent - mechanical lift Transfer via Lift Equipment: Youth worker Ambulation: No Gait Gait: No Stairs / Additional Locomotion Stairs: No Wheelchair Mobility Wheelchair Mobility: No   Discharge Criteria: Patient will be discharged from PT if patient refuses treatment 3 consecutive times without medical reason, if treatment goals not met, if there is a change in medical status, if patient makes no progress towards goals or if patient is discharged from hospital.  The above assessment, treatment plan, treatment alternatives and goals were discussed and mutually agreed upon: by patient  [name] 10/12/2024, 12:57 PM

## 2024-10-12 NOTE — Consult Note (Signed)
 WOC Nurse Consult Note: Reason for Consult: left elbow wound Patient stated that he has had this wound for many months and can not recall how it devleoped Wound type: Recalcitrant wound Does not appear PI related Measurement: 4 x 5 Wound bed: non-viable scab; raised, oddly shaped Drainage (amount, consistency, odor) none; dry Periwound: dehydrated skin Dressing procedure/placement/frequency: Cleanse left elbow wound with normal saline, pat dry, apply 4x4 Mepilex foam dressing, assess every shift, change foam dressing every 2-3 days.   Patient had been seen previously by [facility]. Documentation of stage 1 to buttock; patient was having therapy session and not able to fully assess, spoke to nurse lead regarding need for weekly images. But recommend Mepilex foam dressing to sacrum. VAC [employer] foam to RLE noted and VAC unit functioning well, plan is for patient to follow up with Dr. Harden, chronic R heel Unstageable PI POA. Coordinated care with primary nurse.    10/11/24 left elbow image taken by staff   Please reconsult if wound worsens in condition and notify provider.   Sherrilyn Hals MSN RN CWOCN WOC [facility]  [phone] (Available from 7-3 pm Mon-Friday)

## 2024-10-12 NOTE — Care Plan (Signed)
 Wound Plan  Wounds present: Wound 10/11/24 1630 Pressure Injury Heel Right Unstageable - Full thickness tissue loss in which the base of the injury is covered by slough (yellow, tan, gray, green or brown) and/or eschar (tan, brown or black) in the wound bed.  Wound 10/05/24 0815 Irritant Contact Dermatitis Buttocks Right;Left  Wound 10/11/24 1630 Other (Comment) Elbow Left;Posterior   Interventions:   Diet regular Room eating 45%-75% ascorbic acid  (VITAMIN C ) tablet 500 mg, Oral, Daily feeding supplement (ENSURE PLUS HIGH PROTEIN) liquid 237 mL, Oral, 2 times daily between meals Gerhardt's butt cream Topical, 2 times daily multivitamin with minerals tablet 1 tablet, Oral, Every 24 hours nutrition supplement (JUVEN) (JUVEN) powder packet 1 packet, Oral, 2 times daily between meals zinc  sulfate (50mg  elemental zinc ) capsule 220 mg, Oral, Daily Wound care:Routine, Every shift, First occurrence on Tue 10/12/24 at 1029 Cleanse left elbow wound, Sacrum, and Right heel with normal saline daily, pat dry well with gauze, apply 4x4 Mepilex foam dressings and Sacrum Mepilex foam dressing to buttock, assess every shift, change foam dressing every 2-3 days. Foam dressing   Braden Score: 15  Sensory: 3  Moisture: 3  Activity: 2  Mobility: 2  Nutrition: 3  Friction: 2  Contributors: Gilford Sherrilyn NOVAK, RN Registered Nurse WOC [place], Darice LABOR, FNP Family Nurse Practitioner WOC Dr. Duwaine Lovorn Rolan Pitch, PA Eulalio Falls, BSN, RN-C, [facility]

## 2024-10-12 NOTE — Evaluation (Signed)
 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 Active Problems:   Hx of fasciotomy   Lumbar radiculopathy   Protein-calorie malnutrition, severe   Past Medical History:  Past Medical History:  Diagnosis Date   Anxiety    new dx   Arthritis    lumbar   Atherosclerotic vascular disease    Cataract    COPD (chronic obstructive pulmonary disease) (HCC)    Diverticulosis    Elevated PSA    Headache(784.0)    MIGRAINES   Hypertension 2021   Iritis    CHRONIC IN LEFT EYE - SOME VISIAL IMPAIRMENT IN LEFT EYE   Neuromuscular disorder (HCC)    left leg/foot,pinched siactic nerve   Pain    PAIN LEFT HIP AND DOWN LT LEG WITH NUMBNESS IN LEFT LEG--PT STATES SCIATIC NERVE IMPINGEMENT - PT PLANS BACK IN THE NEAR SURGERY.   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   Renal cysts, acquired, bilateral 03/19/2013   several simple , CT   Urinary frequency    AND NOCTURIA   Past Surgical History:  Past Surgical History:  Procedure Laterality Date   ABDOMINAL AORTOGRAM W/LOWER EXTREMITY N/A 09/21/2024   Procedure: ABDOMINAL AORTOGRAM W/LOWER EXTREMITY;  Surgeon: Serene Gaile ORN, MD;  [facility]: [facility];  Service: Cardiovascular;  Laterality: N/A;   APPLICATION OF WOUND VAC Right 09/27/2024   Procedure: APPLICATION, WOUND VAC RIGHT LOWER EXTREMITY;  Surgeon: Lanis Fonda BRAVO, MD;  Location: [facility] OR;  Service: Vascular;  Laterality: Right;   APPLICATION OF WOUND VAC Right  10/01/2024   Procedure: APPLICATION, WOUND VAC;  Surgeon: Lanis Fonda BRAVO, MD;  Location: [facility] OR;  Service: Vascular;  Laterality: Right;   APPLICATION, SKIN SUBSTITUTE Right 09/27/2024   Procedure: APPLICATION, [place] SKIN SUBSTITUTE;  Surgeon: Lanis Fonda BRAVO, MD;  Location: [facility] OR;  Service: Vascular;  Laterality: Right;   APPLICATION, SKIN SUBSTITUTE Right 10/01/2024   Procedure: APPLICATION, SKIN SUBSTITUTE KERECIS 95SQ CM;  Surgeon: Lanis Fonda BRAVO, MD;  Location: [facility] OR;  Service: Vascular;  Laterality: Right;   AXILLARY-FEMORAL BYPASS GRAFT Right 09/24/2024   Procedure: CREATION, BYPASS, ARTERIAL, AXILLARY TO BILATERAL FEMORAL, USING PROPATEN X 80CM X 2 GRAFT;  Surgeon: Serene Gaile ORN, MD;  Location: [facility] OR;  Service: Vascular;  Laterality: Right;   BACK SURGERY     CARDIAC CATHETERIZATION  03/10/2018   CATARACT EXTRACTION W/ INTRAOCULAR LENS IMPLANT Left    ENDARTERECTOMY FEMORAL Bilateral 09/24/2024   Procedure: BILATERAL FEMORAL ENDARTERECTOMY;  Surgeon: Serene Gaile ORN, MD;  Location: [facility] OR;  Service: Vascular;  Laterality: Bilateral;   EYE SURGERY     RETINAL SURGERY LEFT EYE   FASCIOTOMY Right 09/24/2024   Procedure: RIGHT LOWER LEG FOUR COMPARTMENT FASCIOTOMY WITH RESECTION OF TIBIALIS ANTERIOR;  Surgeon: Serene Gaile ORN, MD;  Location[facility];  Service: Vascular;  Laterality: Right;   FASCIOTOMY CLOSURE Right 09/27/2024   Procedure: RIGHT LOWER EXTREMITY FASCIOTOMY WASHOUT;  Surgeon: Lanis Fonda BRAVO, MD;  Location: [facility] OR;  Service: Vascular;  Laterality: Right;  RIGHT LEG CLOSURE   HYDROCELE EXCISION Left 03/05/2013   Procedure: HYDROCELECTOMY ADULT;  Surgeon: Donnice Gwenyth Brooks, MD;  Location: [facility];  Service: Urology;  Laterality: Left;   INCISION AND DRAINAGE OF DEEP ABSCESS, CALF Right 09/29/2024   Procedure: INCISION AND DRAINAGE OF DEEP ABSCESS, CALF;  Surgeon: Harden Jerona GAILS, MD;  Location: [facility];  Service: Orthopedics;  Laterality: Right;    INCISION AND DRAINAGE OF DEEP ABSCESS, CALF Right 10/06/2024   Procedure: INCISION AND DRAINAGE OF DEEP ABSCESS, CALF RIGHT;  Surgeon: Harden Jerona GAILS, MD;  Location: [facility];  Service: Orthopedics;  Laterality: Right;  DEBRIDEMENT RIGHT LEG   INCISION AND DRAINAGE OF WOUND Right 10/01/2024   Procedure: IRRIGATION AND DEBRIDEMENT WOUND;  Surgeon: Lanis Fonda BRAVO, MD;  Location: [facility] OR;  Service: Vascular;  Laterality: Right;   INCISIONAL HERNIA REPAIR N/A 06/18/2022   Procedure: REPAIR OF INTERNAL HERNIA;  Surgeon: Rubin Calamity, MD;  Location: [facility] OR;  Service: General;  Laterality: N/A;   INGUINAL HERNIA REPAIR Bilateral AGE 11   INSERTION OF MESH N/A 06/11/2022   Procedure: INSERTION OF MESH;  Surgeon: Rubin Calamity, MD;  Location: [facility] OR;  Service: General;  Laterality: N/A;   INSERTION OF MESH N/A 06/18/2022   Procedure: INSERTION OF MESH;  Surgeon: Rubin Calamity, MD;  Location: [facility] OR;  Service: General;  Laterality: N/A;   KYPHOPLASTY N/A 05/08/2023   Procedure: THORACIC SEVEN KYPHOPLASTY;  Surgeon: Beuford Anes, MD;  [facility];  Service: Orthopedics;  Laterality: N/A;   LAPAROSCOPY N/A 06/18/2022   Procedure: LAPAROSCOPY DIAGNOSTIC;  Surgeon: Rubin Calamity, MD;  Location: [facility] OR;  Service: General;  Laterality: N/A;   LOWER EXTREMITY ANGIOGRAM Right 09/24/2024   Procedure: RIGHT LOWER EXTREMITY ANGIOGRAM AND RIGHT POPLETEAL THROMBECTOMY;  Surgeon: Serene Gaile ORN, MD;  Location[facility];  Service: Vascular;  Laterality: Right;   LYMPHADENECTOMY Bilateral 05/06/2013   Procedure: REDGIE;  Surgeon: Noretta Ferrara, MD;  Location: [facility] OR[facility];  Service: Urology;  Laterality: Bilateral;   PROSTATE BIOPSY N/A 03/05/2013   Procedure: PROSTATE BIOPSY and ultrasound;  Surgeon: Donnice Gwenyth Brooks, MD;  Location: [facility];  Service: Urology;  Laterality: N/A;   REMOVAL BURSA SAC, LEFT ELBOW  1996   ROBOT ASSISTED LAPAROSCOPIC RADICAL PROSTATECTOMY N/A 05/06/2013    Procedure: ROBOTIC ASSISTED LAPAROSCOPIC RADICAL PROSTATECTOMY LEVEL 3;  Surgeon: Noretta Ferrara, MD;  Location: [facility] OR[facility];  Service: Urology;  Laterality: N/A;   THROMBECTOMY FEMORAL ARTERY Bilateral 09/24/2024   Procedure: BILATERAL FEMORAL ARTERY THROMBECTOMY;  Surgeon: Serene Gaile ORN, MD;  Location: [facility];  Service: Vascular;  Laterality: Bilateral;   TONSILLECTOMY     as achild   TRANSFORAMINAL LUMBAR INTERBODY FUSION (TLIF) WITH PEDICLE SCREW FIXATION 1 LEVEL Left 09/08/2024   Procedure: LEFT-SIDED LUMBAR 4- LUMBAR 5 TRANSFORAMINAL LUMBAR INTERBODY FUSION AND DECOMPRESSION WITH INSTRUMENTATION AND ALLOGRAFT;  Surgeon: Beuford Anes, MD;  Location: [facility];  Service: Orthopedics;  Laterality: Left;  LEFT-SIDED LUMBAR 4- LUMBAR 5 TRANSFORAMINAL LUMBAR INTERBODY FUSION AND DECOMPRESSION WITH INSTRUMENTATION AND ALLOGRAFT   XI ROBOTIC ASSISTED VENTRAL HERNIA N/A 06/11/2022   Procedure: ROBOTIC INCISIONAL HERNIA REPAIR WITH MESH;  Surgeon: Rubin Calamity, MD;  Location: [facility] OR;  Service: General;  Laterality: N/A;    Assessment & Plan Clinical Impression: Patient is a 68 y.o. year old male with recent admission to the hospital on 9/10 to under go left-sided L4-5 transforaminal lumbar interbody fusion/posterior lateral fusion insertion of interbody device revision L4-5 decompression 09/08/2024  per Dr. Beuford. Noted intraoperatively patient had developed hives swelling of tongue and lips along with hypotension with critical care medicine consulted possible anaphylactic shock and was started on epi drip and given Decadron . He did require short-term intubation was transferred to the ICU and extubated 9/11.  Post op right lower extremity numbness persisted.  Vascular surgery Dr. Penne Colorado consulted 09/19/2024 for persistent leg pain. ABIs were not able to be done due to patient's level of pain. Monitoring of CK levels ranging from 3886-1434. Diagnostic angiography completed 09/21/2024 per Dr. 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 CT angiography chest abdomen and pelvis completed showing stable infrarenal aortic aneurysm. Patient underwent right axillary bifemoral bypass graft with bilateral common femoral, superficial femoral and profundofemoral thrombectomy with redo of left common femoral artery exposure and 4 compartment right leg fasciotomy resection of necrotic muscle in the right anterior compartment with bilateral femoral enterectomy 09/24/2024 per Dr. Serene. Patient did return to the OR same day 09/24/2024 due to popliteal signal being different than what it was initially in the operating room it appeared to be monophasic thus returned back for reexploration angiography to make sure blood flow is optimized and angiography revealed a filling defect in the popliteal artery at the level of the knee. There was reconstitution of the posterior tibial artery which was dominant runoff. Peroneal artery also opacified. It was elected to reopen the medial right below-knee incision and dissected out the popliteal artery.  Patient did require right calf lateral fasciotomy washout debridement of skin and soft tissue vacuum assisted dressing 09/27/2024 per vascular surgery Dr. Fonda Simpers followed by excisional debridement skin soft tissue muscle and fascia anterior compartment right leg. Transfer of the lateral compartment muscles to the anterior compartment to cover the anterior tibial vessels 09/29/2024 per Dr. Harden and ultimately with right leg fasciotomy washout again completed 10/01/2024 and later completed again with excisional debridement right calf with excision of soft tissue muscle and fascia application of wound VAC 10/06/2024 per Dr. Harden. Patient did require episodes of ongoing hypotension 10/10 order of 1 unit packed red blood cells for hemoglobin of 7.2 latest hemoglobin 7.7.   Patient transferred to CIR on 10/11/2024 .    Patient currently requires total with basic self-care skills secondary to muscle  weakness and decreased safety awareness and decreased memory.  Prior to hospitalization, patient could complete BADL's with modified independent .  Patient will benefit from skilled intervention to decrease level of assist with basic self-care skills and increase independence with basic self-care skills prior to discharge home with care partner.  Anticipate patient will require 24 hour supervision and minimal physical assistance and follow up home health.  OT - End of Session Activity Tolerance: Tolerates < 10 min activity with changes in vital signs Endurance Deficit: Yes OT Assessment Rehab Potential (ACUTE ONLY): Good OT Barriers to Discharge: Neurogenic Bowel & Bladder;Wound Care OT Patient demonstrates impairments in the following area(s): Balance;Pain;Cognition;Safety;Sensory;Edema;Endurance;Skin Integrity;Motor OT Basic ADL's Functional Problem(s): Grooming;Bathing;Dressing;Toileting OT Advanced ADL's Functional Problem(s): None OT Transfers Functional Problem(s): Toilet;Tub/Shower OT Additional Impairment(s): Fuctional Use of Upper Extremity OT Plan OT Intensity: Minimum of 1-2 x/day, 45 to 90 minutes OT Frequency: 5 out of 7 days OT Treatment/Interventions: Balance/vestibular training;Discharge planning;Pain management;Self Care/advanced ADL retraining;Therapeutic Activities;UE/LE Coordination activities;Cognitive remediation/compensation;Disease mangement/prevention;Functional mobility training;Patient/family education;Skin care/wound managment;Therapeutic Exercise;Community reintegration;DME/adaptive equipment instruction;Neuromuscular re-education;Psychosocial support;UE/LE Strength taining/ROM;Wheelchair propulsion/positioning OT Self Feeding Anticipated Outcome(s): Mod I OT Basic Self-Care Anticipated Outcome(s): Supervision OT Toileting Anticipated Outcome(s): SBA OT Bathroom Transfers Anticipated Outcome(s):  SBA OT Recommendation Recommendations for Other Services: Neuropsych  consult;Speech consult;Therapeutic Recreation consult Therapeutic Recreation Interventions: Stress management;Pet therapy Patient destination: Home Follow Up Recommendations: Home health OT Equipment Recommended: 3 in 1 bedside comode;To be determined   OT Evaluation Precautions/Restrictions  Precautions Precautions: Fall;Back Precaution Booklet Issued: Yes (comment) Recall of Precautions/Restrictions: Impaired Precaution/Restrictions Comments: watch BP (orthostatic); RLE foot drop, wound VAC Required Braces or Orthoses: Spinal Brace Spinal Brace: Thoracolumbosacral orthotic;Applied in sitting position Restrictions Weight Bearing Restrictions Per Provider Order: Yes RLE Weight Bearing Per Provider Order: Weight bearing as tolerated Other Position/Activity Restrictions: transfers only on RLE General Chart Reviewed: Yes Family/Caregiver Present: No Vital Signs   Pain Pain Assessment Pain Scale: 0-10 Pain Score: 8  Faces Pain Scale: Hurts whole lot Pain Type: Surgical pain;Acute pain Pain Location: Calf Pain Orientation: Right Pain Descriptors / Indicators: Constant Pain Onset: On-going Patients Stated Pain Goal: 0 Pain Intervention(s): Medication (See eMAR);Repositioned Home Living/Prior Functioning Home Living Available Help at Discharge: Family, Available 24 hours/day Type of Home: House Home Access: Stairs to enter [employer] of Steps: 4 Entrance Stairs-Rails: Can reach both Home Layout: Two level, Able to live on main level with bedroom/bathroom Bathroom Shower/Tub: Health visitor: Handicapped height Bathroom Accessibility: Yes Additional Comments: bench in shower HH showerhead, grab bars in shower  Lives With: Spouse IADL History Homemaking Responsibilities: No Current License: Yes 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 Perception  Perception: Within Functional Limits Praxis Praxis: WFL Cognition Cognition Overall Cognitive Status: Impaired/Different from baseline Arousal/Alertness: Awake/alert Orientation Level: Person;Place Memory: Impaired Memory Impairment: Retrieval deficit;Decreased recall of new information;Decreased short term memory Decreased Short Term Memory: Verbal basic Awareness: Impaired Problem Solving: Impaired Safety/Judgment: Impaired Brief Interview for Mental Status (BIMS) Repetition of Three Words (First Attempt): None Temporal Orientation: Year: Correct Temporal Orientation: Month: Accurate within 5 days Temporal Orientation: Day: Incorrect Recall: Sock: No, could not recall Recall: Blue: No, could not recall Recall: Bed: No, could not recall BIMS Summary Score: 5 Sensation Sensation Hot/Cold: Appears Intact Additional Comments: BUE sensation grossly intact. Coordination Gross Motor Movements are Fluid and Coordinated: No Fine Motor Movements are Fluid and Coordinated: No Coordination and Movement Description: increased BUE/hand tremor Motor  Motor Motor - Skilled Clinical Observations: unable to DF right foot  Trunk/Postural Assessment  Cervical Assessment Cervical Assessment: Exceptions to [facility] Lumbar Assessment Lumbar Assessment: Exceptions to [facility] Postural Control Postural Control: Deficits on evaluation  Balance Balance Balance Assessed: Yes Static Sitting Balance Static Sitting - Balance Support: Bilateral upper extremity supported;Feet supported Static Sitting - Level of Assistance: 4: Min  assist Dynamic Sitting Balance Dynamic Sitting - Balance Support: Feet supported;Bilateral upper extremity supported Dynamic Sitting - Level of Assistance: 4: Min assist Extremity/Trunk Assessment RUE Assessment Passive Range of Motion (PROM) Comments: WFL General Strength Comments: 4/5 LUE Assessment Passive Range of Motion (PROM) Comments: WFL General Strength Comments: 4/5  Care Tool Care Tool Self Care Eating   Eating Assist Level: Set up assist    Oral Care    Oral Care Assist Level: Minimal Assistance - Patient > 75%    Bathing   Body parts bathed by  patient: Right arm;Left arm;Chest;Abdomen;Face Body parts bathed by helper: Front perineal area;Buttocks;Right upper leg;Left lower leg;Right lower leg;Left upper leg   Assist Level: Total Assistance - Patient < 25%    Upper Body Dressing(including orthotics)   What is the patient wearing?: Hospital gown only   Assist Level: Moderate Assistance - Patient 50 - 74%    Lower Body Dressing (excluding footwear)     Assist for lower body dressing: Total Assistance - Patient < 25%    Putting on/Taking off footwear   What is the patient wearing?: Non-skid slipper socks Assist for footwear: Dependent - Patient 0%       Care Tool Toileting Toileting activity   Assist for toileting: Total Assistance - Patient < 25%     Care Tool Bed Mobility Roll left and right activity   Roll left and right assist level: Moderate Assistance - Patient 50 - 74%    Sit to lying activity   Sit to lying assist level: Maximal Assistance - Patient 25 - 49%    Lying to sitting on side of bed activity   Lying to sitting on side of bed assist level: the ability to move from lying on the back to sitting on the side of the bed with no back support.: Maximal Assistance - Patient 25 - 49%     Care Tool Transfers Sit to stand transfer   Sit to stand assist level: 2 Helpers    Chair/bed transfer Chair/bed transfer activity did not occur:  Safety/medical concerns       Toilet transfer Toilet transfer activity did not occur: Safety/medical concerns Assist Level: 2 Helpers     Care Tool Cognition  Expression of Ideas and Wants Expression of Ideas and Wants: 3. Some difficulty - exhibits some difficulty with expressing needs and ideas (e.g, some words or finishing thoughts) or speech is not clear  Understanding Verbal and Non-Verbal Content Understanding Verbal and Non-Verbal Content: 3. Usually understands - understands most conversations, but misses some part/intent of message. Requires cues at times to understand   Memory/Recall Ability Memory/Recall Ability : That he or she is in a hospital/hospital unit   Refer to Care Plan for Long Term Goals  SHORT TERM GOAL WEEK 1 OT Short Term Goal 1 (Week 1): Patient to complete LB dressing with Mod A OT Short Term Goal 2 (Week 1): Patient to perform toileting with Mod A OT Short Term Goal 3 (Week 1): Patient to perform toilet transfer with Min assist OT Short Term Goal 4 (Week 1): Pt will don/doff TLSO brace with Mod A  Recommendations for other services:  Neuro psych, Therapeutic rec., Speech pathology.   Skilled Therapeutic Intervention ADL ADL Equipment Provided: Reacher Eating: Set up Where Assessed-Eating: Bed level Grooming: Minimal assistance Where Assessed-Grooming: Bed level Upper Body Bathing: Moderate assistance Where Assessed-Upper Body Bathing: Bed level Lower Body Bathing: Dependent Where Assessed-Lower Body Bathing: Bed level Upper Body Dressing: Moderate assistance Where Assessed-Upper Body Dressing: Edge of bed Lower Body Dressing: Dependent Where Assessed-Lower Body Dressing: Edge of bed Toileting: Dependent Where Assessed-Toileting: Bed level Toilet Transfer:  (UTA due to hypotensive sitting EOB. Needs assist of 2) Toilet Transfer Method: Unable to assess Tub/Shower Transfer: Unable to assess Psychologist, counselling Transfer: Unable to assess ADL  Comments: Patient with difficulty follow directions for ADL's with fulctuation of cognition. Mobility  Bed Mobility Bed Mobility: Rolling Left Rolling Right: Moderate Assistance - Patient 50-74% Rolling Left: Moderate Assistance - Patient 50-74% Supine to Sit: Maximal Assistance -  Patient - Patient 25-49% Transfers Sit to Stand: 2 Helpers;Dependent - mechanical lift   Discharge Criteria: Patient will be discharged from OT if patient refuses treatment 3 consecutive times without medical reason, if treatment goals not met, if there is a change in medical status, if patient makes no progress towards goals or if patient is discharged from hospital.  The above assessment, treatment plan, treatment alternatives and goals were discussed and mutually agreed upon: by patient  David King 10/12/2024, 12:17 PM

## 2024-10-12 NOTE — Progress Notes (Signed)
 PROGRESS NOTE   Subjective/Complaints:  Pt reports pain was 3-4/10 initially; now 6-8/10- got some meds 30 minutes ago.   No BM lately- has been at least 3 days- cannot find one for 5 days.  Says has no catheter- no urinal in room.  Cannot find a foley bag from condom cath or Foley.    K+ 3.2  ROS: Per HPI   Pt denies SOB, abd pain, CP, N/V/C/D, and vision changes   Objective:   No results found. Recent Labs    10/11/24 0836 10/12/24 0458  WBC 12.6* 12.1*  HGB 8.9* 8.4*  HCT 27.8* 26.6*  PLT 181 186   Recent Labs    10/12/24 0458  NA 133*  K 3.2*  CL 97*  CO2 23  GLUCOSE 115*  BUN 17  CREATININE 0.94  CALCIUM  8.5*    Intake/Output Summary (Last 24 hours) at 10/12/2024 0932 Last data filed at 10/11/2024 1900 Gross per 24 hour  Intake 240 ml  Output --  Net 240 ml     Wound 10/05/24 0815 Pressure Injury Buttocks Right;Left Stage 1 -  Intact skin with non-blanchable redness of a localized area usually over a bony prominence. (Active)     Wound 10/11/24 1630 Pressure Injury Heel Right Unstageable - Full thickness tissue loss in which the base of the injury is covered by slough (yellow, tan, gray, green or brown) and/or eschar (tan, brown or black) in the wound bed. (Active)    Physical Exam: Vital Signs Blood pressure 98/70, pulse (!) 104, temperature 98 F (36.7 C), temperature source Oral, resp. rate 18, weight 86.2 kg, SpO2 98%.   General: awake, alert, appropriate,  sitting up in bed; NAD HENT: conjugate gaze; oropharynx moist CV: regular rate; no JVD Pulmonary: CTA B/L; no W/R/R- good air movement GI: soft, but diffusely TTP;  Psychiatric: appropriate- flat affect but naxious Neurological: Ox3 Skin:    General: Skin is warm.     Multiple wounds as below                              Neurological:     Comments: Patient is alert.  Complains of right leg pain.   Oriented x 3 and follows commands. CN exam non-focal. Fair insight and awareness. MMT: BUE 5/5. LLE limited by  pain but appears to be moving all muscle groups. RLE limited by wounds/pain. Sensory exam normal for light touch and pain except for right foot where there is diminished LT along dorsum and to a lesser extent plantar aspect of foot.  No limb ataxia or cerebellar signs. No abnormal tone appreciated.    Psychiatric:     Comments: Cooperative but anxious        Assessment/Plan: 1. Functional deficits which require 3+ hours per day of interdisciplinary therapy in a comprehensive inpatient rehab setting. Physiatrist is providing close team supervision and 24 hour management of active medical problems listed below. Physiatrist and rehab team continue to assess barriers to discharge/monitor patient progress toward functional and medical goals  Care Tool:  Bathing  Bathing assist       Upper Body Dressing/Undressing Upper body dressing        Upper body assist      Lower Body Dressing/Undressing Lower body dressing            Lower body assist       Toileting Toileting    Toileting assist       Transfers Chair/bed transfer  Transfers assist           Locomotion Ambulation   Ambulation assist              Walk 10 feet activity   Assist           Walk 50 feet activity   Assist           Walk 150 feet activity   Assist           Walk 10 feet on uneven surface  activity   Assist           Wheelchair     Assist               Wheelchair 50 feet with 2 turns activity    Assist            Wheelchair 150 feet activity     Assist          Blood pressure 98/70, pulse (!) 104, temperature 98 F (36.7 C), temperature source Oral, resp. rate 18, weight 86.2 kg, SpO2 98%.  Medical Problem List and Plan: 1. Functional deficits secondary to PAD/compartment syndrome/right leg  ischemia/status post thrombectomy.  Status post right leg anterior compartment fasciotomy 9/26 with multiple washouts as well as excisional debridement/wound VAC.  Patient is completing a course of doxycycline             -patient may not yet shower             -ELOS/Goals: 10-14 days, supervision goals  First day evaluations- con't CIR PT and OT  Team conference today to determine LOS 2.  Antithrombotics: -DVT/anticoagulation:  Pharmaceutical: Heparin  awaiting plan to possibly transition to Eliquis today 10/11/2024 10/14- con't Eliquis             -antiplatelet therapy: Aspirin  81 mg daily 3. Pain Management: Lyrica  150 mg 3 times daily, OxyContin  sustained-release 10 mg every 12 hours, oxycodone  10 mg every 4 hours as needed breakthrough pain Advil  400 mg every 4 hours as needed  10/14- pain is doing better- overall  4. Mood/Behavior/Sleep: Cymbalta  30 mg nightly, Celexa  20 mg daily             -antipsychotic agents: N/A 5. Neuropsych/cognition: This patient is capable of making decisions on his own behalf. 6. Skin/Wound Care: Routine skin checks             -vac management per WOC RN and Dr. Harden             -local care and pressure relief as needed to multiple other wounds.             -prevalon boot/pressure relief, right heel wound 7. Fluids/Electrolytes/Nutrition: Routine and analysis with follow-up chemistries 8. lumbar radiculopathy/large disc herniation spinal stenosis.  Status post L4-5 transforaminal interbody fusion/posterior lateral fusion insertion interbody device L4-5 decompression 09/08/2024 per Dr. Beuford.  Back brace when out of bed    9.  Acute blood loss anemia.  Follow-up CBC  10/14- Pt has ~ 150-200cc out of VAC- not sure since when?  Hb down 1/2 unit from 8.9 to 8.4- will recheck Thursday 10.  AKI.  Resolved after IV hydration. 11.  Hyperlipidemia.  Crestor  12.  COPD/remote tobacco use.  Monitor oxygen  saturations every shift 13.  History of prostate cancer/urinary  retention.  History of status post prostatectomy.  External catheter currently in place.  Continue Flomax  0.4 mg daily  10/14- is voiding- but had urinary retention last time- will check a few bladder scans.  14.  History of detached retina left eye.  Follow-up outpatient ophthalmology. 15. Severe constipation  10/14- LBM at least 4-5 days ago per documentation- pt doesn't remember when went last- will get KUB- and order Sorbitol  30cc- and SSE if no results- having abd pain.  16.  Hypokalemia  10/14- K+ 3.2- will replete KCL 40 Meq x1.   I spent a total of 52   minutes on total care today- >50% coordination of care- due to  D/w pt at length about bowels, urination, monitoring Hb esp with drainage form VAC, d/w WOC this AM and PA; and team conference to determine length of stay    LOS: 1 days A FACE TO FACE EVALUATION WAS PERFORMED  Jleigh Striplin 10/12/2024, 9:32 AM

## 2024-10-13 ENCOUNTER — Inpatient Hospital Stay (HOSPITAL_COMMUNITY)

## 2024-10-13 DIAGNOSIS — I77819 Aortic ectasia, unspecified site: Secondary | ICD-10-CM | POA: Diagnosis not present

## 2024-10-13 DIAGNOSIS — R918 Other nonspecific abnormal finding of lung field: Secondary | ICD-10-CM | POA: Diagnosis not present

## 2024-10-13 DIAGNOSIS — I700 Atherosclerosis of aorta: Secondary | ICD-10-CM | POA: Diagnosis not present

## 2024-10-13 DIAGNOSIS — M5416 Radiculopathy, lumbar region: Secondary | ICD-10-CM | POA: Diagnosis not present

## 2024-10-13 DIAGNOSIS — M79A21 Nontraumatic compartment syndrome of right lower extremity: Secondary | ICD-10-CM | POA: Diagnosis not present

## 2024-10-13 LAB — BASIC METABOLIC PANEL WITH GFR
Anion gap: 12 (ref 5–15)
BUN: 19 mg/dL (ref 8–23)
CO2: 24 mmol/L (ref 22–32)
Calcium: 8.9 mg/dL (ref 8.9–10.3)
Chloride: 98 mmol/L (ref 98–111)
Creatinine, Ser: 0.91 mg/dL (ref 0.61–1.24)
GFR, Estimated: >60 mL/min (ref >60)
Glucose, Bld: 101 mg/dL — ABNORMAL HIGH (ref 70–99)
Potassium: 3.7 mmol/L (ref 3.5–5.1)
Sodium: 134 mmol/L — ABNORMAL LOW (ref 135–145)

## 2024-10-13 LAB — URINALYSIS, ROUTINE W REFLEX MICROSCOPIC
Bilirubin Urine: NEGATIVE
Glucose, UA: NEGATIVE mg/dL
Hgb urine dipstick: NEGATIVE
Ketones, ur: NEGATIVE mg/dL
Nitrite: NEGATIVE
Protein, ur: NEGATIVE mg/dL
Specific Gravity, Urine: 1.017 (ref 1.005–1.030)
pH: 6 (ref 5.0–8.0)

## 2024-10-13 LAB — CBC WITH DIFFERENTIAL/PLATELET
Abs Immature Granulocytes: 0.26 K/uL — ABNORMAL HIGH (ref 0.00–0.07)
Basophils Absolute: 0.1 K/uL (ref 0.0–0.1)
Basophils Relative: 0 %
Eosinophils Absolute: 0.1 K/uL (ref 0.0–0.5)
Eosinophils Relative: 1 %
HCT: 28.8 % — ABNORMAL LOW (ref 39.0–52.0)
Hemoglobin: 9.1 g/dL — ABNORMAL LOW (ref 13.0–17.0)
Immature Granulocytes: 2 %
Lymphocytes Relative: 9 %
Lymphs Abs: 1.2 K/uL (ref 0.7–4.0)
MCH: 29.3 pg (ref 26.0–34.0)
MCHC: 31.6 g/dL (ref 30.0–36.0)
MCV: 92.6 fL (ref 80.0–100.0)
Monocytes Absolute: 0.9 K/uL (ref 0.1–1.0)
Monocytes Relative: 7 %
Neutro Abs: 10.8 K/uL — ABNORMAL HIGH (ref 1.7–7.7)
Neutrophils Relative %: 81 %
Platelets: 204 K/uL (ref 150–400)
RBC: 3.11 MIL/uL — ABNORMAL LOW (ref 4.22–5.81)
RDW: 18.5 % — ABNORMAL HIGH (ref 11.5–15.5)
WBC: 13.2 K/uL — ABNORMAL HIGH (ref 4.0–10.5)
nRBC: 0 % (ref 0.0–0.2)

## 2024-10-13 LAB — BLOOD GAS, ARTERIAL
Acid-Base Excess: 2.6 mmol/L — ABNORMAL HIGH (ref 0.0–2.0)
Bicarbonate: 25.9 mmol/L (ref 20.0–28.0)
O2 Saturation: 95.3 %
Patient temperature: 37.3
pCO2 arterial: 34 mmHg (ref 32–48)
pH, Arterial: 7.48 — ABNORMAL HIGH (ref 7.35–7.45)
pO2, Arterial: 65 mmHg — ABNORMAL LOW (ref 83–108)

## 2024-10-13 LAB — AMMONIA: Ammonia: 16 umol/L (ref 9–35)

## 2024-10-13 LAB — PROCALCITONIN: Procalcitonin: <0.1 ng/mL

## 2024-10-13 LAB — LACTIC ACID, PLASMA: Lactic Acid, Venous: 1.1 mmol/L (ref 0.5–1.9)

## 2024-10-13 MED ORDER — FLUTICASONE-UMECLIDIN-VILANT 100-62.5-25 MCG/ACT IN AEPB
1.0000 | INHALATION_SPRAY | Freq: Every day | RESPIRATORY_TRACT | Status: DC
  Administered 2024-10-14 – 2024-11-04 (×21): 1 via RESPIRATORY_TRACT
  Filled 2024-10-13: qty 1

## 2024-10-13 MED ORDER — BUDESON-GLYCOPYRROL-FORMOTEROL 160-9-4.8 MCG/ACT IN AERO
2.0000 | INHALATION_SPRAY | Freq: Two times a day (BID) | RESPIRATORY_TRACT | Status: DC
  Filled 2024-10-13: qty 5.9

## 2024-10-13 MED ORDER — AMPICILLIN-SULBACTAM SODIUM 3 (2-1) G IJ SOLR
3.0000 g | Freq: Four times a day (QID) | INTRAMUSCULAR | Status: DC
  Administered 2024-10-14 – 2024-10-19 (×23): 3 g via INTRAVENOUS
  Filled 2024-10-13 (×23): qty 8

## 2024-10-13 MED ORDER — GERHARDT'S BUTT CREAM
TOPICAL_CREAM | Freq: Two times a day (BID) | CUTANEOUS | Status: DC
  Administered 2024-10-18 – 2024-10-25 (×3): 1 via TOPICAL
  Filled 2024-10-13: qty 60

## 2024-10-13 NOTE — Progress Notes (Signed)
 Physical Therapy Session Note  Patient Details  Name: David King MRN: [id number] Date of Birth: Mar 31, 1956  Today's Date: 10/13/2024 PT Individual Time: 0802-0859 PT Individual Time Calculation (min): 57 min   Short Term Goals: Week 1:  PT Short Term Goal 1 (Week 1): Pt will tolerate sitting upright >2 hours PT Short Term Goal 2 (Week 1): Pt will perform stedy transfers with mod a or better PT Short Term Goal 3 (Week 1): Pt will consistently perform supine<>sit with CGA  Skilled Therapeutic Interventions/Progress Updates:  Patient supine in bed on entrance to room. Patient alert and agreeable to PT session. Pt on 1.5L of pushed RA.   Patient with no pain complaint at start of session.   Pt does demonstrate confusion. He did not sleep well relating that he was worried about the surgery he is supposed to have today. Has not eaten breakfast due to the surgery and wants coffee. Is upset d/t inability to have coffee. Chart review does not indicate surgery in place for today and not signs on pt's door indicating requirement to be NPO prior to procedure. Pt adamant in desire to have coffee.   Discussed with RN/ NS/ NT as new rules in place re: obtaining coffee for patients. Pt continues to perseverate on coffee.   Prior to starting mobility, orthostatic vitals taken and noted below.  Wound vac in place with good pressure noted. Prevalon boot removed. Pt able to slowly bring BLE off EOB, and rise to seated position with CGA for UB. During orthostatic vitals, pt relates dizziness. With time in seated position, pt relates increasing dizziness. And so process stopped and pt returned to supine with ModA.   Requested to keep prevalon boot off. Care team notified re: disorientation, coffee and vitals.    ORTHOSTATIC VITAL SIGNS BP taken in SUPINE: 97/71 (81), pulse 93, SpO2 90% BP taken in SITTING after : 74/56 (64), pulse 102, SpO2 92% BP taken in SITTING after : unable d/t worsening of  dizziness BP taken in SUPINE:107/75 (83), pulse 93, SpO2 91%  Patient supine in bed at end of session with brakes locked, bed alarm set, and all needs within reach.   Therapy Documentation Precautions:  Precautions Precautions: Fall, Back Precaution Booklet Issued: Yes (comment) Recall of Precautions/Restrictions: Impaired Precaution/Restrictions Comments: watch BP (orthostatic); RLE foot drop, wound VAC Required Braces or Orthoses: Spinal Brace Spinal Brace: Thoracolumbosacral orthotic, Applied in sitting position Restrictions Weight Bearing Restrictions Per Provider Order: Yes RLE Weight Bearing Per Provider Order: Weight bearing as tolerated Other Position/Activity Restrictions: transfers only on RLE  Pain: Pt relates 3/10 pain at RLE and is addressed with repositioning and distraction.   Therapy/Group: Individual Therapy  Mliss DELENA Milliner PT, DPT, CSRS 10/13/2024, 4:21 PM

## 2024-10-13 NOTE — Progress Notes (Signed)
 Lactic acid WNL. Procal pending but CXR with LLL opacities question HCAP. Discussed with Earle/pharmacy and Unasyn added for treatment.

## 2024-10-13 NOTE — Progress Notes (Signed)
 Occupational Therapy Session Note  Patient Details  Name: David King MRN: [id number] Date of Birth: Mar 17, 1956  Today's Date: 10/13/2024 OT Individual Time: 0930-1040 OT Individual Time Calculation (min): 70 min    Short Term Goals: Week 1:  OT Short Term Goal 1 (Week 1): Patient to complete LB dressing with Mod A OT Short Term Goal 2 (Week 1): Patient to perform toileting with Mod A OT Short Term Goal 3 (Week 1): Patient to perform toilet transfer with Min assist OT Short Term Goal 4 (Week 1): Pt will don/doff TLSO brace with Mod A  Skilled Therapeutic Interventions/Progress Updates:    Pt resting in bed upon arrival with 2L O2 via n/c. No order for O2 and discussed with PA Joie) to determine need for O2. O2 removed and O2 sats monitored during session (91%-94% on RA.) Pt had not eaten breakfast because he thought he was having surgery today. Discussed with pt that no surgery was scheduled and it was important to eat. Pt agreeable to eat some chocolate pudding and provided with Ensure Plus which pt agreed to drink. Pt able to reposition in bed with superviison using bed rails to pull up in bed. BUE therex for general conditioning and monitor O2 sats. Reviewed hospital course and discharge plans. Reviewed OT LTGs. Pt remained in bed with all needs within reach.   Therapy Documentation Precautions:  Precautions Precautions: Fall, Back Precaution Booklet Issued: Yes (comment) Recall of Precautions/Restrictions: Impaired Precaution/Restrictions Comments: watch BP (orthostatic); RLE foot drop, wound VAC Required Braces or Orthoses: Spinal Brace Spinal Brace: Thoracolumbosacral orthotic, Applied in sitting position Restrictions Weight Bearing Restrictions Per Provider Order: Yes RLE Weight Bearing Per Provider Order: Weight bearing as tolerated Other Position/Activity Restrictions: transfers only on RLE Pain:  Pt denies pain this morning when at rest    Therapy/Group: Individual  Therapy  [name] 10/13/2024, 11:56 AM

## 2024-10-13 NOTE — Care Management (Signed)
 [facility] Individual Statement of Services  Patient Name:  David King  Date:  10/13/2024  Welcome to the [facility].  Our goal is to provide you with an individualized program based on your diagnosis and situation, designed to meet your specific needs.  With this comprehensive rehabilitation program, you will be expected to participate in at least 3 hours of rehabilitation therapies Monday-Friday, with modified therapy programming on the weekends.  Your rehabilitation program will include the following services:  Physical Therapy (PT), Occupational Therapy (OT), 24 hour per day rehabilitation nursing, Therapeutic Recreaction (TR), Psychology, Neuropsychology, Care Coordinator, Rehabilitation Medicine, Nutrition Services, Pharmacy Services, and Other  Weekly team conferences will be held on Tuesday to discuss your progress.  Your Inpatient Rehabilitation Care Coordinator will talk with you frequently to get your input and to update you on team discussions.  Team conferences with you and your family in attendance may also be held.  Expected length of stay: 2-3 weeks    Overall anticipated outcome: Minimal Assistance  Depending on your progress and recovery, your program may change. Your Inpatient Rehabilitation Care Coordinator will coordinate services and will keep you informed of any changes. Your Inpatient Rehabilitation Care Coordinator's name and contact numbers are listed  below.  The following services may also be recommended but are not provided by the Inpatient [facility]:  Driving Evaluations Home Health Rehabiltiation Services Outpatient Rehabilitation Services Vocational Rehabilitation   Arrangements will be made to provide these services after discharge if needed.  Arrangements include referral to agencies that provide these services.  Your insurance has been verified to be:  [facility] Medicare  Your primary doctor is:  Carlin Dale Gull  Pertinent information will be shared with your doctor and your insurance company.  Inpatient Rehabilitation Care Coordinator:  Graeme Jude, KEN [phone] or ([phone]  Information discussed with and copy given to patient by: Graeme DELENA Jude, 10/13/2024, 9:01 AM

## 2024-10-13 NOTE — Progress Notes (Signed)
 PROGRESS NOTE   Subjective/Complaints:  Pt reports attempted to pee and have BM- was unable to.    Thinks needs to have surgery today, but no sign of this- including is on regular diet, no note from surgery, etc.  Pt then reports I feel so confused.  Note in chart, wife concerned about his tremors/jerking movements this afternoon .   Said just got cathed.     ROS: Per HPI  Limited by delirium   Objective:   DG Abd 1 View Result Date: 10/12/2024 CLINICAL DATA:  Abdominal pain and constipation. EXAM: ABDOMEN - 1 VIEW COMPARISON:  10/04/2024 FINDINGS: Prominent stool throughout the colon favors constipation. Levoconvex lumbar scoliosis with rotary component. Lower thoracic and lumbar spondylosis. Postoperative Farber the lower lumbar spine. Bilateral iliac stents noted. Mild degenerative hip arthropathy bilaterally. Lung bases appear clear. IMPRESSION: 1. Prominent stool throughout the colon favors constipation. 2. Levoconvex lumbar scoliosis with rotary component. 3. Mild degenerative hip arthropathy bilaterally. Electronically Signed   By: Ryan Salvage M.D.   On: 10/12/2024 16:19   Recent Labs    10/11/24 0836 10/12/24 0458  WBC 12.6* 12.1*  HGB 8.9* 8.4*  HCT 27.8* 26.6*  PLT 181 186   Recent Labs    10/12/24 0458 10/13/24 0533  NA 133* 134*  K 3.2* 3.7  CL 97* 98  CO2 23 24  GLUCOSE 115* 101*  BUN 17 19  CREATININE 0.94 0.91  CALCIUM  8.5* 8.9    Intake/Output Summary (Last 24 hours) at 10/13/2024 1645 Last data filed at 10/13/2024 1400 Gross per 24 hour  Intake 120 ml  Output 2100 ml  Net -1980 ml     Wound 10/11/24 1630 Pressure Injury Heel Right Unstageable - Full thickness tissue loss in which the base of the injury is covered by slough (yellow, tan, gray, green or brown) and/or eschar (tan, brown or black) in the wound bed. (Active)    Physical Exam: Vital Signs Blood pressure 100/84,  pulse (!) 113, temperature 99.2 F (37.3 C), temperature source Oral, resp. rate 17, height 5' 9 (1.753 m), weight 86.2 kg, SpO2 93%.    General: awake, alert, delirious about needing more surgery- pt also reports he's confused  NAD HENT: conjugate gaze; oropharynx moist CV: regular rate and rhythm this AM- rate in 90's; no JVD Pulmonary: decreased throughout but sounded stable- on new 2L O2 this AM GI: soft, NT, ND, (+)BS Psychiatric: confused, somewhat anxious Neurological: pt confused- thought was going for surgery today- no sign/notes/orders of this-     General: Skin is warm.     Multiple wounds as below                              Neurological:     Comments: Patient is alert.  Complains of right leg pain.  Oriented x 3 and follows commands. CN exam non-focal. Fair insight and awareness. MMT: BUE 5/5. LLE limited by  pain but appears to be moving all muscle groups. RLE limited by wounds/pain. Sensory exam normal for light touch and pain except for right foot where there is diminished LT along dorsum  and to a lesser extent plantar aspect of foot.  No limb ataxia or cerebellar signs. No abnormal tone appreciated.    Psychiatric:     Comments: Cooperative but anxious        Assessment/Plan: 1. Functional deficits which require 3+ hours per day of interdisciplinary therapy in a comprehensive inpatient rehab setting. Physiatrist is providing close team supervision and 24 hour management of active medical problems listed below. Physiatrist and rehab team continue to assess barriers to discharge/monitor patient progress toward functional and medical goals  Care Tool:  Bathing    Body parts bathed by patient: Right arm, Left arm, Chest, Abdomen, Face   Body parts bathed by helper: Front perineal area, Buttocks, Right upper leg, Left lower leg, Right lower leg, Left upper leg     Bathing assist Assist Level: Total Assistance - Patient < 25%     Upper Body  Dressing/Undressing Upper body dressing   What is the patient wearing?: Hospital gown only    Upper body assist Assist Level: Moderate Assistance - Patient 50 - 74%    Lower Body Dressing/Undressing Lower body dressing            Lower body assist Assist for lower body dressing: Total Assistance - Patient < 25%     Toileting Toileting    Toileting assist Assist for toileting: Total Assistance - Patient < 25%     Transfers Chair/bed transfer  Transfers assist  Chair/bed transfer activity did not occur: Safety/medical concerns  Chair/bed transfer assist level: 2 Helpers     Locomotion Ambulation   Ambulation assist   Ambulation activity did not occur: Safety/medical concerns          Walk 10 feet activity   Assist  Walk 10 feet activity did not occur: Safety/medical concerns        Walk 50 feet activity   Assist Walk 50 feet with 2 turns activity did not occur: Safety/medical concerns         Walk 150 feet activity   Assist Walk 150 feet activity did not occur: Safety/medical concerns         Walk 10 feet on uneven surface  activity   Assist Walk 10 feet on uneven surfaces activity did not occur: Safety/medical concerns         Wheelchair     Assist Is the patient using a wheelchair?: Yes Type of Wheelchair: Manual Wheelchair activity did not occur: Safety/medical concerns         Wheelchair 50 feet with 2 turns activity    Assist    Wheelchair 50 feet with 2 turns activity did not occur: Safety/medical concerns       Wheelchair 150 feet activity     Assist  Wheelchair 150 feet activity did not occur: Safety/medical concerns       Blood pressure 100/84, pulse (!) 113, temperature 99.2 F (37.3 C), temperature source Oral, resp. rate 17, height 5' 9 (1.753 m), weight 86.2 kg, SpO2 93%.  Medical Problem List and Plan: 1. Functional deficits secondary to PAD/compartment syndrome/right leg  ischemia/status post thrombectomy.  Status post right leg anterior compartment fasciotomy 9/26 with multiple washouts as well as excisional debridement/wound VAC.  Patient is completing a course of doxycycline             -patient may not yet shower             -ELOS/Goals: 10-14 days, supervision goals  D/c 2-3 weeks  Con't CIR PT<  OT and asked by staff to get SLP eval due to confusion 2.  Antithrombotics: -DVT/anticoagulation:  Pharmaceutical: Heparin  awaiting plan to possibly transition to Eliquis today 10/11/2024 10/14- con't Eliquis             -antiplatelet therapy: Aspirin  81 mg daily 3. Pain Management: Lyrica  150 mg 3 times daily, OxyContin  sustained-release 10 mg every 12 hours, oxycodone  10 mg every 4 hours as needed breakthrough pain Advil  400 mg every 4 hours as needed  10/14- pain is doing better- overall   10/15- pain still skyrockets with therapy and had to stop today due to pain 4. Mood/Behavior/Sleep: Cymbalta  30 mg nightly, Celexa  20 mg daily             -antipsychotic agents: N/A 5. Neuropsych/cognition: This patient is capable of making decisions on his own behalf. 6. Skin/Wound Care: Routine skin checks             -vac management per WOC RN and Dr. Harden             -local care and pressure relief as needed to multiple other wounds.             -prevalon boot/pressure relief, right heel wound 7. Fluids/Electrolytes/Nutrition: Routine and analysis with follow-up chemistries 8. lumbar radiculopathy/large disc herniation spinal stenosis.  Status post L4-5 transforaminal interbody fusion/posterior lateral fusion insertion interbody device L4-5 decompression 09/08/2024 per Dr. Beuford.  Back brace when out of bed    9.  Acute blood loss anemia.  Follow-up CBC  10/14- Pt has ~ 150-200cc out of VAC- not sure since when? Hb down 1/2 unit from 8.9 to 8.4- will recheck Thursday  10/15- Hb will check in AM 10.  AKI.  Resolved after IV hydration. 11.  Hyperlipidemia.  Crestor  12.   COPD/remote tobacco use.  Monitor oxygen  saturations every shift  10/15- was on O2 this AM- per nursing from overnight only- but Sats 89-91%- getting CXR and ABG to see where he is- and getting sepsis work up.  13.  History of prostate cancer/urinary retention.  History of status post prostatectomy.  External catheter currently in place.  Continue Flomax  0.4 mg daily  10/14- is voiding- but had urinary retention last time- will check a few bladder scans.   10/15 - pt needing in/out caths- had >1400cc in bladder- has cathing protocol now 14.  History of detached retina left eye.  Follow-up outpatient ophthalmology. 15. Severe constipation  10/14- LBM at least 4-5 days ago per documentation- pt doesn't remember when went last- will get KUB- and order Sorbitol  30cc- and SSE if no results- having abd pain.  16.  Hypokalemia  10/14- K+ 3.2- will replete KCL 40 Meq x1.  17. Low grade temp in setting of delirium  10/15- will get sepsis work up, ammonia level and ABG 18. Cirrhosis on last Abd imaging  10/15- will get Ammonia as well esp with new jerking/tremors.   I spent a total of 59   minutes on total care today- >50% coordination of care- due to  D/w nursing including charge and pt's nurse- PA at length and reviewed labs and chart at length and vitals/labs- getting sepsis work up= ammonia and CXR - (-) U/A already done- also ABG due to Severe COPD  LOS: 2 days A FACE TO FACE EVALUATION WAS PERFORMED  Claryssa Sandner 10/13/2024, 4:45 PM

## 2024-10-13 NOTE — Progress Notes (Signed)
 Met with patient and wife to review current situation,team conference and plan of care. Reviewed wound care, pain, medications. Continue to follow along to provide educational needs to facilitate preparation for discharge.

## 2024-10-13 NOTE — Progress Notes (Signed)
 Occupational Therapy Session Note  Patient Details  Name: David King MRN: [id number] Date of Birth: [birth year]  Today's Date: 10/13/2024 OT Individual Time: 8692-8590 OT Individual Time Calculation (min): 62 min  13 mins missed as pt requesting time to attempt to void bowels/bladder  Short Term Goals: Week 1:  OT Short Term Goal 1 (Week 1): Patient to complete LB dressing with Mod A OT Short Term Goal 2 (Week 1): Patient to perform toileting with Mod A OT Short Term Goal 3 (Week 1): Patient to perform toilet transfer with Min assist OT Short Term Goal 4 (Week 1): Pt will don/doff TLSO brace with Mod A  Skilled Therapeutic Interventions/Progress Updates:  Pt greeted supine in bed, pt agreeable to OT intervention.    Pt reports 8/10 pain, pain meds requested during session, provided rest breaks as needed.  BP from supine- 94/78(85)  HR 115 bpm  Donned ted hose on LLE for BP mgmt  Transfers/bed mobility/functional mobility:  Pt requested to urinate, pt able to set- up urinal with supervision, + time to attempt to urinate but pt unable to void. Pt agreeable to attempt to sit EOB. Pt completed supine>sit via log roll technique with CGA with use of bed features. Pt reports dizziness from sitting EOB and then reports urgency to void bowels.pt returned to supine with CGA to elevate BLEs. Pt able to roll to R side with MIN A to place bed pan. Unable to void bowels but did note smear in rectum when providing total A for pericare.   After 2 attempts to void bowels and increased pain, pt declined further mobility attempts.                 Ended session with pt supine in bed with all needs within reach and bed alarm activated.                    Therapy Documentation Precautions:  Precautions Precautions: Fall, Back Precaution Booklet Issued: Yes (comment) Recall of Precautions/Restrictions: Impaired Precaution/Restrictions Comments: watch BP (orthostatic); RLE foot drop, wound  VAC Required Braces or Orthoses: Spinal Brace Spinal Brace: Thoracolumbosacral orthotic, Applied in sitting position Restrictions Weight Bearing Restrictions Per Provider Order: Yes RLE Weight Bearing Per Provider Order: Weight bearing as tolerated Other Position/Activity Restrictions: transfers only on RLE    Therapy/Group: Individual Therapy  [name] 10/13/2024, 2:52 PM

## 2024-10-13 NOTE — Progress Notes (Signed)
 Met with patient and wife, sister to review current situation, team conference and plan of care. Per wife she is concerned that current medication for pain is making him have the jerky movements. PA was notified. Reviewed wound care, bowel and bladder. Continue to follo w along to provide educational needs to facilitate preparation for discharge.

## 2024-10-13 NOTE — Progress Notes (Addendum)
 Patient with fine resting tremors. Lying in bed NAD. Able to state name, place, situation, month, day, year, eye problems (treatment by Dr. Elner). Denies any CP or palpitations --HR in 110's. Does report SOB with activity. Pulse ox 89% on RA--flutter valve ordered due to hx of COPD/recent surgery. No cough reported and tremors at baseline per nurse. Patient reports on set of tremors since surgery--question polypharmacy v/s debility.  Has had reports of confusion and does have hx of cirrhosis Ammonia level ordered. CBC w/diff ordered as reports of retention and need for cath this am. UA negative except for budding yeast on micro. Renal status/Na improved on labs this am. NSAIDs dc due to recent AKI. Orthostatic changes ongoing.   ABG with evidence of hypoxia--metabolic alkalosis? Question PNA v/s COPD exacerbation doubt PE as was on IV heparin  transitioned to Eliquis at admission. CXR ordered and pending. One liter oxygen  per Miranda ordered. Sepsis markers ordered--will discuss with hosptalist also for input.

## 2024-10-13 NOTE — Plan of Care (Signed)
  Problem: SCI BOWEL ELIMINATION Goal: RH STG MANAGE BOWEL WITH ASSISTANCE Description: STG Manage Bowel with supervision Assistance. Outcome: Progressing   Problem: SCI BLADDER ELIMINATION Goal: RH STG MANAGE BLADDER WITH ASSISTANCE Description: STG Manage Bladder With supervision Assistance Outcome: Progressing   Problem: RH SKIN INTEGRITY Goal: RH STG SKIN FREE OF INFECTION/BREAKDOWN Description: Manage skin free of infection with supervision assistance Outcome: Progressing   Problem: RH SAFETY Goal: RH STG ADHERE TO SAFETY PRECAUTIONS W/ASSISTANCE/DEVICE Description: STG Adhere to Safety Precautions With Assistance/Device. Outcome: Progressing   Problem: RH PAIN MANAGEMENT Goal: RH STG PAIN MANAGED AT OR BELOW PT'S PAIN GOAL Description: < 4 w/ prns Outcome: Progressing   Problem: RH KNOWLEDGE DEFICIT SCI Goal: RH STG INCREASE KNOWLEDGE OF SELF CARE AFTER SCI Description: Manage increase knowledge of self care after SCI with supervision assistance from wife using educational materials provided Outcome: Progressing

## 2024-10-14 ENCOUNTER — Inpatient Hospital Stay (HOSPITAL_COMMUNITY)

## 2024-10-14 DIAGNOSIS — M5416 Radiculopathy, lumbar region: Secondary | ICD-10-CM | POA: Diagnosis not present

## 2024-10-14 DIAGNOSIS — M79A21 Nontraumatic compartment syndrome of right lower extremity: Secondary | ICD-10-CM | POA: Diagnosis not present

## 2024-10-14 LAB — CBC WITH DIFFERENTIAL/PLATELET
Abs Immature Granulocytes: 0.23 K/uL — ABNORMAL HIGH (ref 0.00–0.07)
Basophils Absolute: 0.1 K/uL (ref 0.0–0.1)
Basophils Relative: 1 %
Eosinophils Absolute: 0.2 K/uL (ref 0.0–0.5)
Eosinophils Relative: 1 %
HCT: 29.2 % — ABNORMAL LOW (ref 39.0–52.0)
Hemoglobin: 9 g/dL — ABNORMAL LOW (ref 13.0–17.0)
Immature Granulocytes: 2 %
Lymphocytes Relative: 10 %
Lymphs Abs: 1.3 K/uL (ref 0.7–4.0)
MCH: 28.9 pg (ref 26.0–34.0)
MCHC: 30.8 g/dL (ref 30.0–36.0)
MCV: 93.9 fL (ref 80.0–100.0)
Monocytes Absolute: 0.9 K/uL (ref 0.1–1.0)
Monocytes Relative: 7 %
Neutro Abs: 10.1 K/uL — ABNORMAL HIGH (ref 1.7–7.7)
Neutrophils Relative %: 79 %
Platelets: 189 K/uL (ref 150–400)
RBC: 3.11 MIL/uL — ABNORMAL LOW (ref 4.22–5.81)
RDW: 18.5 % — ABNORMAL HIGH (ref 11.5–15.5)
WBC: 12.7 K/uL — ABNORMAL HIGH (ref 4.0–10.5)
nRBC: 0 % (ref 0.0–0.2)

## 2024-10-14 LAB — BASIC METABOLIC PANEL WITH GFR
Anion gap: 11 (ref 5–15)
BUN: 16 mg/dL (ref 8–23)
CO2: 23 mmol/L (ref 22–32)
Calcium: 8.9 mg/dL (ref 8.9–10.3)
Chloride: 101 mmol/L (ref 98–111)
Creatinine, Ser: 0.9 mg/dL (ref 0.61–1.24)
GFR, Estimated: >60 mL/min
Glucose, Bld: 122 mg/dL — ABNORMAL HIGH (ref 70–99)
Potassium: 4.2 mmol/L (ref 3.5–5.1)
Sodium: 135 mmol/L (ref 135–145)

## 2024-10-14 LAB — URINE CULTURE: Culture: NO GROWTH

## 2024-10-14 MED ORDER — MAGNESIUM CITRATE PO SOLN
1.0000 | Freq: Once | ORAL | Status: DC
  Filled 2024-10-14: qty 296

## 2024-10-14 MED ORDER — MIDODRINE HCL 5 MG PO TABS
5.0000 mg | ORAL_TABLET | Freq: Three times a day (TID) | ORAL | Status: DC
  Administered 2024-10-14 – 2024-10-18 (×12): 5 mg via ORAL
  Filled 2024-10-14 (×13): qty 1

## 2024-10-14 MED ORDER — SODIUM CHLORIDE 0.9 % IV SOLN: INTRAVENOUS | Status: AC | PRN

## 2024-10-14 MED ORDER — GUAIFENESIN ER 600 MG PO TB12
600.0000 mg | ORAL_TABLET | Freq: Two times a day (BID) | ORAL | Status: DC
  Administered 2024-10-14 – 2024-11-05 (×45): 600 mg via ORAL
  Filled 2024-10-14 (×45): qty 1

## 2024-10-14 MED ORDER — SORBITOL 70 % SOLN
45.0000 mL | Freq: Once | Status: AC
Start: 2024-10-14 — End: 2024-10-14
  Administered 2024-10-14: 45 mL via ORAL
  Filled 2024-10-14: qty 60

## 2024-10-14 NOTE — Plan of Care (Signed)
  Problem: RH Problem Solving Goal: LTG Patient will demonstrate problem solving for (SLP) Description: LTG:  Patient will demonstrate problem solving for basic/complex daily situations with cues  (SLP) Flowsheets (Taken 10/14/2024 2024) LTG: Patient will demonstrate problem solving for (SLP): Basic daily situations LTG Patient will demonstrate problem solving for: Modified Independent   Problem: RH Memory Goal: LTG Patient will demonstrate ability for day to day (SLP) Description: LTG:   Patient will demonstrate ability for day to day recall/carryover during cognitive/linguistic activities with assist  (SLP) Flowsheets (Taken 10/14/2024 2024) LTG: Patient will demonstrate ability for day to day recall:  New information  Biographical information LTG: Patient will demonstrate ability for day to day recall/carryover during cognitive/linguistic activities with assist (SLP): Modified Independent   Problem: RH Cognition - SLP Goal: RH LTG Patient will demonstrate orientation with cues Description:  LTG:  Patient will demonstrate orientation to person/place/time/situation with cues (SLP)   Flowsheets (Taken 10/14/2024 2024) LTG Patient will demonstrate orientation to:  Person  Place  Time  Situation LTG: Patient will demonstrate orientation using cueing (SLP): Modified Independent

## 2024-10-14 NOTE — IPOC Note (Signed)
 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 Active Problems:   Hx of fasciotomy   Lumbar radiculopathy   Protein-calorie malnutrition, severe     Functional Problem List: Nursing Bladder, Bowel, Edema, Endurance, Medication Management, Pain, Skin Integrity, Behavior  PT Balance, Pain, Safety, Edema, Endurance, Sensory, Skin Integrity, Motor  OT Balance, Pain, Cognition, Safety, Sensory, Edema, Endurance, Skin Integrity, Motor  SLP    TR         Basic ADL's: OT Grooming, Bathing, Dressing, Toileting     Advanced  ADL's: OT None     Transfers: PT Bed Mobility, Bed to Chair, Customer service manager, Tub/Shower     Locomotion: PT Ambulation, Wheelchair Mobility     Additional Impairments: OT Fuctional Use of Upper Extremity  SLP        TR      Anticipated Outcomes Item Anticipated Outcome  Self Feeding Mod I  Swallowing      Basic self-care  Supervision  Toileting  SBA   Bathroom Transfers SBA  Bowel/Bladder  manage bowels with medications/manage bladder with toileting assistance  Transfers  min a with LRAD  Locomotion  supervision w/c  Communication     Cognition     Pain  <4 w/ prns  Safety/Judgment  manage safety with supervision assistance   Therapy Plan: PT Intensity: Minimum of 1-2 x/day ,45 to 90 minutes PT Frequency: 5 out of 7 days PT Duration Estimated Length of Stay: 2-3 weeks OT Intensity: Minimum of 1-2 x/day, 45 to 90 minutes OT Frequency: 5 out of 7 days OT Duration/Estimated Length of Stay: 2-3 weeks     Team Interventions: Nursing Interventions Patient/Family Education, Bladder Management, Bowel Management, Disease Management/Prevention, Pain Management, Medication Management, Skin Care/Wound  Management  PT interventions Ambulation/gait training, Discharge planning, Functional mobility training, Psychosocial support, Therapeutic Activities, Wheelchair propulsion/positioning, Therapeutic Exercise, Skin care/wound management, Neuromuscular re-education, Disease management/prevention, Warden/ranger, DME/adaptive equipment instruction, Pain management, Splinting/orthotics, Community reintegration, Functional electrical stimulation, UE/LE Strength taining/ROM, Patient/family education, Stair training, UE/LE Coordination activities, Cognitive remediation/compensation  OT Interventions Balance/vestibular training, Discharge planning, Pain management, Self Care/advanced ADL retraining, Therapeutic Activities, UE/LE Coordination activities, Cognitive remediation/compensation, Disease mangement/prevention, Functional mobility training, Patient/family education, Skin care/wound managment, Therapeutic Exercise, Community reintegration, Fish farm manager, Neuromuscular re-education, Psychosocial support, UE/LE Strength taining/ROM, Wheelchair propulsion/positioning  SLP Interventions    TR Interventions    SW/CM Interventions Discharge Planning, Psychosocial Support, Patient/Family Education   Barriers to Discharge MD  Medical stability, Home enviroment access/loayout, IV antibiotics, Incontinence, Neurogenic bowel and bladder, Wound care, Lack of/limited family support, Weight bearing restrictions, and Behavior  Nursing Decreased caregiver support, Home environment access/layout, Incontinence, Wound Care Discharge Living Setting: Patient's home  Type of Home at Discharge: House  Discharge Home Layout: One level  Discharge Home Access: Stairs to enter  Entrance Stairs-Rails: Can reach both  Entrance Stairs-Number of Steps: 4  PT None    OT Neurogenic Bowel & Bladder, Wound Care    SLP      SW Decreased caregiver support, Lack of/limited family support, Community education officer for SNF  coverage     Team Discharge Planning: Destination: PT-Home ,OT- Home , SLP-  Projected Follow-up: PT-Home health PT, OT-  Home health OT, SLP-  Projected Equipment Needs: PT-To be determined, OT- 3 in 1 bedside comode,  To be determined, SLP-  Equipment Details: PT- , OT-  Patient/family involved in discharge planning: PT- Patient,  OT-Patient, SLP-   MD ELOS: 2-3 weeks Medical Rehab Prognosis:  Good Assessment: The patient has been admitted for CIR therapies with the diagnosis of RLE ischemia s/p fasciotomy and HCAP. The team will be addressing functional mobility, strength, stamina, balance, safety, adaptive techniques and equipment, self-care, bowel and bladder mgt, patient and caregiver education, VAC on RLE. Goals have been set at SBA ot min A. Anticipated discharge destination is home with wife.        See Team Conference Notes for weekly updates to the plan of care

## 2024-10-14 NOTE — Progress Notes (Signed)
 Patient had a very large BM this afternoon and felt relief from that. He refused SSE and Mag. Will continue to give Miralax  as scheduled.   David King KATHEE Molt

## 2024-10-14 NOTE — Progress Notes (Addendum)
 Patient evaluated once again at his bedside.  Patient seems to be in good spirits today.  Patient continues to report resolution of his preoperative left leg pain.  He does feel that the pain in his right heel has been slowly improving.  He has been ambulating, primarily putting weight on his left leg.  With regards to his right leg, his wound VAC is in place.  A foot drop is noted on the right.  I did place an order for an AFO brace for when he is upright to help with ambulation. Additional management of his right leg will continue to be overseen by his vascular team and by Dr. Harden.  His lower lumbar wound is healing well.  I will continue to follow this course.  I did again ask that he give his wife Cheron my best.

## 2024-10-14 NOTE — Progress Notes (Signed)
 Occupational Therapy Session Note  Patient Details  Name: David King MRN: [id number] Date of Birth: January 17, 1956  Today's Date: 10/14/2024 OT Individual Time: 9184-9074 OT Individual Time Calculation (min): 70 min    Short Term Goals: Week 1:  OT Short Term Goal 1 (Week 1): Patient to complete LB dressing with Mod A OT Short Term Goal 2 (Week 1): Patient to perform toileting with Mod A OT Short Term Goal 3 (Week 1): Patient to perform toilet transfer with Min assist OT Short Term Goal 4 (Week 1): Pt will don/doff TLSO brace with Mod A  Skilled Therapeutic Interventions/Progress Updates:    Skilled OT intervention with focus on bed mobility and pain mgmt. RN present throughout session to provided +2. Pt with multiple attempts to use bed pain 2/2 pt report that he feels the urge. Pt with no BM during session. O2 sats 94% on 1L O2. Pt rolled R/L x 5 and pulled self up in bed with min A throughout session. Tot A for hygiene and clothing mgmt. Pt elected to continue wearing hospital gown for comfort. Pt with ongoing tremors during session. Pt remained in bed in sidelying position to provide relief on buttocks. Reviewed OT LTG. All needs within reach.   Therapy Documentation Precautions:  Precautions Precautions: Fall, Back Precaution Booklet Issued: Yes (comment) Recall of Precautions/Restrictions: Impaired Precaution/Restrictions Comments: watch BP (orthostatic); RLE foot drop, wound VAC Required Braces or Orthoses: Spinal Brace Spinal Brace: Thoracolumbosacral orthotic, Applied in sitting position Restrictions Weight Bearing Restrictions Per Provider Order: Yes RLE Weight Bearing Per Provider Order: Weight bearing as tolerated Other Position/Activity Restrictions: transfers only on RLE Pain:  Pt c/o bowel discomfort/pain; MD and RN aware and repositioned   Therapy/Group: Individual Therapy  [name] 10/14/2024, 9:28 AM

## 2024-10-14 NOTE — Progress Notes (Signed)
 Orthopedic Tech Progress Note Patient Details:  David King 12-26-1956 [id number]  Order for an AFO called into [facility].  Patient ID: David King, male   DOB: [birth year], 68 y.o.   MRN: [id number]  Tinnie Ronal Brasil 10/14/2024, 4:13 PM

## 2024-10-14 NOTE — Progress Notes (Addendum)
 PROGRESS NOTE   Subjective/Complaints:  Doesn't feel good this AM Don't feel great'- abdominal pain and pain on buttocks from liquid stool/Type 7 last night- kept repeating having lots of diarrhea, but can only find had 1x last night ~ 10pm.   Just not happy per pt.  Also shivering, but denies feeling cold  ROS: Per HPI   Pt denies SOB,  (+)abd pain, CP, N/V/ (+)C/D, and vision changes    Objective:   DG CHEST PORT 1 VIEW Result Date: 10/13/2024 EXAM: 1 VIEW XRAY OF THE CHEST 10/13/2024 06:38:00 PM COMPARISON: Chest x-ray 10/06/2024. CLINICAL HISTORY: Altered mental status. FINDINGS: LUNGS AND PLEURA: Patchy opacities in the left lung base. Increased central interstitial markings bilaterally. No pulmonary edema. No pleural effusion. No pneumothorax. HEART AND MEDIASTINUM: Ectatic aorta with atherosclerotic calcifications. Calcified mediastinal lymph node, unchanged. No acute abnormality of the cardiac and mediastinal silhouettes. BONES AND SOFT TISSUES: No acute osseous abnormality. IMPRESSION: 1. Patchy opacities in the left lung base and increased central interstitial markings bilaterally to mild edema or infectious/inflammatory process . Electronically signed by: Greig Pique MD 10/13/2024 07:42 PM EDT RP Workstation: [id number]   DG Abd 1 View Result Date: 10/12/2024 CLINICAL DATA:  Abdominal pain and constipation. EXAM: ABDOMEN - 1 VIEW COMPARISON:  10/04/2024 FINDINGS: Prominent stool throughout the colon favors constipation. Levoconvex lumbar scoliosis with rotary component. Lower thoracic and lumbar spondylosis. Postoperative Farber the lower lumbar spine. Bilateral iliac stents noted. Mild degenerative hip arthropathy bilaterally. Lung bases appear clear. IMPRESSION: 1. Prominent stool throughout the colon favors constipation. 2. Levoconvex lumbar scoliosis with rotary component. 3. Mild degenerative hip arthropathy  bilaterally. Electronically Signed   By: Ryan Salvage M.D.   On: 10/12/2024 16:19   Recent Labs    10/13/24 2034 10/14/24 0530  WBC 13.2* 12.7*  HGB 9.1* 9.0*  HCT 28.8* 29.2*  PLT 204 189   Recent Labs    10/13/24 0533 10/14/24 0530  NA 134* 135  K 3.7 4.2  CL 98 101  CO2 24 23  GLUCOSE 101* 122*  BUN 19 16  CREATININE 0.91 0.90  CALCIUM  8.9 8.9    Intake/Output Summary (Last 24 hours) at 10/14/2024 0951 Last data filed at 10/14/2024 0900 Gross per 24 hour  Intake 360 ml  Output 300 ml  Net 60 ml     Wound 10/11/24 1630 Pressure Injury Heel Right Unstageable - Full thickness tissue loss in which the base of the injury is covered by slough (yellow, tan, gray, green or brown) and/or eschar (tan, brown or black) in the wound bed. (Active)    Physical Exam: Vital Signs Blood pressure 103/80, pulse 93, temperature 98.2 F (36.8 C), temperature source Oral, resp. rate 18, height 5' 9 (1.753 m), weight 86.2 kg, SpO2 93%.      General: awake, alert, appropriate,  vague; slumped in bed;  is shivering but denies being cold; not actual tremor, NAD HENT: conjugate gaze; oropharynx dry- wearing 1 L O2 CV: regular rate and rhythm- rate in 90's; no JVD Pulmonary: coarse thick cough, but  not productive- decreased at bases; rare wheeze GI: soft, but distended; mildlyl TTP no rebound;  Psychiatric: vague, depressed  affect Neurological: Ox3 better than yesterday -     General: Skin is warm.     Multiple wounds as below                              Neurological:     Comments: Patient is alert.  Complains of right leg pain.  Oriented x 3 and follows commands. CN exam non-focal. Fair insight and awareness. MMT: BUE 5/5. LLE limited by  pain but appears to be moving all muscle groups. RLE limited by wounds/pain. Sensory exam normal for light touch and pain except for right foot where there is diminished LT along dorsum and to a lesser extent plantar aspect  of foot.  No limb ataxia or cerebellar signs. No abnormal tone appreciated.    Psychiatric:     Comments: Cooperative but anxious        Assessment/Plan: 1. Functional deficits which require 3+ hours per day of interdisciplinary therapy in a comprehensive inpatient rehab setting. Physiatrist is providing close team supervision and 24 hour management of active medical problems listed below. Physiatrist and rehab team continue to assess barriers to discharge/monitor patient progress toward functional and medical goals  Care Tool:  Bathing    Body parts bathed by patient: Right arm, Left arm, Chest, Abdomen, Face   Body parts bathed by helper: Front perineal area, Buttocks, Right upper leg, Left lower leg, Right lower leg, Left upper leg     Bathing assist Assist Level: Total Assistance - Patient < 25%     Upper Body Dressing/Undressing Upper body dressing   What is the patient wearing?: Hospital gown only    Upper body assist Assist Level: Moderate Assistance - Patient 50 - 74%    Lower Body Dressing/Undressing Lower body dressing            Lower body assist Assist for lower body dressing: Total Assistance - Patient < 25%     Toileting Toileting    Toileting assist Assist for toileting: Total Assistance - Patient < 25%     Transfers Chair/bed transfer  Transfers assist  Chair/bed transfer activity did not occur: Safety/medical concerns  Chair/bed transfer assist level: 2 Helpers     Locomotion Ambulation   Ambulation assist   Ambulation activity did not occur: Safety/medical concerns          Walk 10 feet activity   Assist  Walk 10 feet activity did not occur: Safety/medical concerns        Walk 50 feet activity   Assist Walk 50 feet with 2 turns activity did not occur: Safety/medical concerns         Walk 150 feet activity   Assist Walk 150 feet activity did not occur: Safety/medical concerns         Walk 10 feet on uneven  surface  activity   Assist Walk 10 feet on uneven surfaces activity did not occur: Safety/medical concerns         Wheelchair     Assist Is the patient using a wheelchair?: Yes Type of Wheelchair: Manual Wheelchair activity did not occur: Safety/medical concerns         Wheelchair 50 feet with 2 turns activity    Assist    Wheelchair 50 feet with 2 turns activity did not occur: Safety/medical concerns       Wheelchair 150 feet activity     Assist  Wheelchair 150 feet activity did not occur:  Safety/medical concerns       Blood pressure 103/80, pulse 93, temperature 98.2 F (36.8 C), temperature source Oral, resp. rate 18, height 5' 9 (1.753 m), weight 86.2 kg, SpO2 93%.  Medical Problem List and Plan: 1. Functional deficits secondary to PAD/compartment syndrome/right leg ischemia/status post thrombectomy.  Status post right leg anterior compartment fasciotomy 9/26 with multiple washouts as well as excisional debridement/wound VAC.  Patient is completing a course of doxycycline             -patient may not yet shower             -ELOS/Goals: 10-14 days, supervision goals  D/c 2-3 weeks  Con't CIR PT< OT and asked by staff to get SLP eval due to confusion 2.  Antithrombotics: -DVT/anticoagulation:  Pharmaceutical: Heparin  awaiting plan to possibly transition to Eliquis today 10/11/2024 10/14- con't Eliquis             -antiplatelet therapy: Aspirin  81 mg daily 3. Pain Management: Lyrica  150 mg 3 times daily, OxyContin  sustained-release 10 mg every 12 hours, oxycodone  10 mg every 4 hours as needed breakthrough pain Advil  400 mg every 4 hours as needed  10/14- pain is doing better- overall   10/15- pain still skyrockets with therapy and had to stop today due to pain 4. Mood/Behavior/Sleep: Cymbalta  30 mg nightly, Celexa  20 mg daily             -antipsychotic agents: N/A 5. Neuropsych/cognition: This patient is capable of making decisions on his own  behalf. 6. Skin/Wound Care: Routine skin checks             -vac management per WOC RN and Dr. Harden             -local care and pressure relief as needed to multiple other wounds.             -prevalon boot/pressure relief, right heel wound 7. Fluids/Electrolytes/Nutrition: Routine and analysis with follow-up chemistries 8. lumbar radiculopathy/large disc herniation spinal stenosis.  Status post L4-5 transforaminal interbody fusion/posterior lateral fusion insertion interbody device L4-5 decompression 09/08/2024 per Dr. Beuford.  Back brace when out of bed    9.  Acute blood loss anemia.  Follow-up CBC  10/14- Pt has ~ 150-200cc out of VAC- not sure since when? Hb down 1/2 unit from 8.9 to 8.4- will recheck Thursday  10/15- Hb will check in AM 10.  AKI.  Resolved after IV hydration. 11.  Hyperlipidemia.  Crestor  12.  COPD/remote tobacco use.  Monitor oxygen  saturations every shift  10/15- was on O2 this AM- per nursing from overnight only- but Sats 89-91%- getting CXR and ABG to see where he is- and getting sepsis work up.   10/16- started on Unasyn and will start Mucinex for coarse cough-  13.  History of prostate cancer/urinary retention.  History of status post prostatectomy.  External catheter currently in place.  Continue Flomax  0.4 mg daily  10/14- is voiding- but had urinary retention last time- will check a few bladder scans.   10/15 - pt needing in/out caths- had >1400cc in bladder- has cathing protocol now  10/16- has orders to bladder scan and in/out if volumes >350cc 14.  History of detached retina left eye.  Follow-up outpatient ophthalmology. 15. Severe constipation  10/14- LBM at least 4-5 days ago per documentation- pt doesn't remember when went last- will get KUB- and order Sorbitol  30cc- and SSE if no results- having abd pain.   10/16- Pt  had 1 liquid stool- but otherwise, no other stools in 6-7 days- last KUB shows on 10/14 was full of stool- will give Sorbitol  since likely  has hard stool that liquid getting by- and SSE- if that doesn't work, will ask nursing to do Mg citrate.  16.  Hypokalemia  10/14- K+ 3.2- will replete KCL 40 Meq x1.  17. Low grade temp in setting of delirium  10/15- will get sepsis work up, ammonia level and ABG 18. Cirrhosis on last Abd imaging  10/15- will get Ammonia as well esp with new jerking/tremors.   10/16- is actually shivering this AM, but isn't cold- Ammonia 16- not the cause of Sx's.  19. Infrarenal aortic aneurysm with mural thrombus- 6.8 cm on last imaging   20. Orthostatic hypotension  10/16- On Flomax , however No BP meds that I can reduce- will check with therapy- if dropping more, will add Midodrine- ordering ACE wrap on LLE  -addendum- adding Midodrine 50 mg TID starting at 630 am  21. HCAP  10/16- on Unasyn-  will con't for 5-6 days- monitor WBC- down to 12.7 from 13.2 last evening. Shivering could be due to breaking a temp>>??   I spent a total of 54   minutes on total care today- >50% coordination of care- due to  Doing IPOC,  went over labs, vitals; changed orders; review  LOS: 3 days A FACE TO FACE EVALUATION WAS PERFORMED  Charlene Cowdrey 10/14/2024, 9:51 AM

## 2024-10-14 NOTE — Evaluation (Signed)
 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 Active Problems:   Hx of fasciotomy   Lumbar radiculopathy   Protein-calorie malnutrition, severe  Past Medical History:  Past Medical History:  Diagnosis Date   Anxiety    new dx   Arthritis    lumbar   Atherosclerotic vascular disease    Cataract    COPD (chronic obstructive pulmonary disease) (HCC)    Diverticulosis    Elevated PSA    Headache(784.0)    MIGRAINES   Hypertension 2021   Iritis    CHRONIC IN LEFT EYE - SOME VISIAL IMPAIRMENT IN LEFT EYE   Neuromuscular disorder (HCC)    left leg/foot,pinched siactic nerve   Pain    PAIN LEFT HIP AND DOWN LT LEG WITH NUMBNESS IN LEFT LEG--PT STATES SCIATIC NERVE IMPINGEMENT - PT PLANS BACK IN THE NEAR SURGERY.   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   Renal cysts, acquired, bilateral 03/19/2013   several simple , CT   Urinary frequency    AND NOCTURIA   Past Surgical History:  Past Surgical History:  Procedure Laterality Date   ABDOMINAL AORTOGRAM W/LOWER EXTREMITY N/A 09/21/2024   Procedure: ABDOMINAL AORTOGRAM W/LOWER EXTREMITY;  Surgeon: Serene Gaile ORN, MD;  Location: [facility];  Service: Cardiovascular;  Laterality: N/A;   APPLICATION OF WOUND VAC Right 09/27/2024   Procedure: APPLICATION, WOUND VAC RIGHT LOWER EXTREMITY;  Surgeon: Lanis Fonda BRAVO, MD;  Location: [facility] OR;  Service: Vascular;  Laterality: Right;   APPLICATION OF WOUND VAC Right 10/01/2024   Procedure:  APPLICATION, WOUND VAC;  Surgeon: Lanis Fonda BRAVO, MD;  Location: [facility] OR;  Service: Vascular;  Laterality: Right;   APPLICATION, SKIN SUBSTITUTE Right 09/27/2024   Procedure: APPLICATION, MYRIAD SKIN SUBSTITUTE;  Surgeon: Lanis Fonda BRAVO, MD;  Location: [facility] OR;  Service: Vascular;  Laterality: Right;   APPLICATION, SKIN SUBSTITUTE Right 10/01/2024   Procedure: APPLICATION, SKIN SUBSTITUTE KERECIS 95SQ CM;  Surgeon: Lanis Fonda BRAVO, MD;  Location: [facility] OR;  Service: Vascular;  Laterality: Right;   AXILLARY-FEMORAL BYPASS GRAFT Right 09/24/2024   Procedure: CREATION, BYPASS, ARTERIAL, AXILLARY TO BILATERAL FEMORAL, USING PROPATEN X 80CM X 2 GRAFT;  Surgeon: Serene Gaile ORN, MD;  Location: [facility] OR;  Service: Vascular;  Laterality: Right;   BACK SURGERY     CARDIAC CATHETERIZATION  03/10/2018   CATARACT EXTRACTION W/ INTRAOCULAR LENS IMPLANT Left    ENDARTERECTOMY FEMORAL Bilateral 09/24/2024   Procedure: BILATERAL FEMORAL ENDARTERECTOMY;  Surgeon: Serene Gaile ORN, MD;  Location: [facility] OR;  Service: Vascular;  Laterality: Bilateral;   EYE SURGERY     RETINAL SURGERY LEFT EYE   FASCIOTOMY Right 09/24/2024   Procedure: RIGHT LOWER LEG FOUR COMPARTMENT FASCIOTOMY WITH RESECTION OF TIBIALIS ANTERIOR;  Surgeon: Serene Gaile ORN, MD;  Location: [facility];  Service: Vascular;  Laterality: Right;   FASCIOTOMY CLOSURE Right 09/27/2024   Procedure: RIGHT LOWER EXTREMITY FASCIOTOMY WASHOUT;  Surgeon: Lanis Fonda BRAVO, MD;  Location: [facility] OR;  Service: Vascular;  Laterality: Right;  RIGHT LEG CLOSURE  HYDROCELE EXCISION Left 03/05/2013   Procedure: HYDROCELECTOMY ADULT;  Surgeon: Donnice Gwenyth Brooks, MD;  Location: [facility];  Service: Urology;  Laterality: Left;   INCISION AND DRAINAGE OF DEEP ABSCESS, CALF Right 09/29/2024   Procedure: INCISION AND DRAINAGE OF DEEP ABSCESS, CALF;  Surgeon: Harden Jerona GAILS, MD;  Location: [facility] OR;  Service: Orthopedics;  Laterality: Right;   INCISION AND DRAINAGE OF  DEEP ABSCESS, CALF Right 10/06/2024   Procedure: INCISION AND DRAINAGE OF DEEP ABSCESS, CALF RIGHT;  Surgeon: Harden Jerona GAILS, MD;  Location: [facility];  Service: Orthopedics;  Laterality: Right;  DEBRIDEMENT RIGHT LEG   INCISION AND DRAINAGE OF WOUND Right 10/01/2024   Procedure: IRRIGATION AND DEBRIDEMENT WOUND;  Surgeon: Lanis Fonda BRAVO, MD;  Location: [facility] OR;  Service: Vascular;  Laterality: Right;   INCISIONAL HERNIA REPAIR N/A 06/18/2022   Procedure: REPAIR OF INTERNAL HERNIA;  Surgeon: Rubin Calamity, MD;  Location: [facility] OR;  Service: General;  Laterality: N/A;   INGUINAL HERNIA REPAIR Bilateral AGE 68   INSERTION OF MESH N/A 06/11/2022   Procedure: INSERTION OF MESH;  Surgeon: Rubin Calamity, MD;  Location: [facility] OR;  Service: General;  Laterality: N/A;   INSERTION OF MESH N/A 06/18/2022   Procedure: INSERTION OF MESH;  Surgeon: Rubin Calamity, MD;  Location: [facility] OR;  Service: General;  Laterality: N/A;   KYPHOPLASTY N/A 05/08/2023   Procedure: THORACIC SEVEN KYPHOPLASTY;  Surgeon: Beuford Anes, MD;  Location: [facility];  Service: Orthopedics;  Laterality: N/A;   LAPAROSCOPY N/A 06/18/2022   Procedure: LAPAROSCOPY DIAGNOSTIC;  Surgeon: Rubin Calamity, MD;  Location: [facility] OR;  Service: General;  Laterality: N/A;   LOWER EXTREMITY ANGIOGRAM Right 09/24/2024   Procedure: RIGHT LOWER EXTREMITY ANGIOGRAM AND RIGHT POPLETEAL THROMBECTOMY;  Surgeon: Serene Gaile ORN, MD;  Location: [facility];  Service: Vascular;  Laterality: Right;   LYMPHADENECTOMY Bilateral 05/06/2013   Procedure: REDGIE;  Surgeon: Noretta Ferrara, MD;  Location: [facility] ORS;  Service: Urology;  Laterality: Bilateral;   PROSTATE BIOPSY N/A 03/05/2013   Procedure: PROSTATE BIOPSY and ultrasound;  Surgeon: Donnice Gwenyth Brooks, MD;  Location: [facility];  Service: Urology;  Laterality: N/A;   REMOVAL BURSA SAC, LEFT ELBOW  1996   ROBOT ASSISTED LAPAROSCOPIC RADICAL PROSTATECTOMY N/A 05/06/2013   Procedure: ROBOTIC ASSISTED  LAPAROSCOPIC RADICAL PROSTATECTOMY LEVEL 3;  Surgeon: Noretta Ferrara, MD;  Location: [facility] ORS;  Service: Urology;  Laterality: N/A;   THROMBECTOMY FEMORAL ARTERY Bilateral 09/24/2024   Procedure: BILATERAL FEMORAL ARTERY THROMBECTOMY;  Surgeon: Serene Gaile ORN, MD;  Location: [facility];  Service: Vascular;  Laterality: Bilateral;   TONSILLECTOMY     as achild   TRANSFORAMINAL LUMBAR INTERBODY FUSION (TLIF) WITH PEDICLE SCREW FIXATION 1 LEVEL Left 09/08/2024   Procedure: LEFT-SIDED LUMBAR 4- LUMBAR 5 TRANSFORAMINAL LUMBAR INTERBODY FUSION AND DECOMPRESSION WITH INSTRUMENTATION AND ALLOGRAFT;  Surgeon: Beuford Anes, MD;  Location: [facility] OR;  Service: Orthopedics;  Laterality: Left;  LEFT-SIDED LUMBAR 4- LUMBAR 5 TRANSFORAMINAL LUMBAR INTERBODY FUSION AND DECOMPRESSION WITH INSTRUMENTATION AND ALLOGRAFT   XI ROBOTIC ASSISTED VENTRAL HERNIA N/A 06/11/2022   Procedure: ROBOTIC INCISIONAL HERNIA REPAIR WITH MESH;  Surgeon: Rubin Calamity, MD;  Location: [facility] OR;  Service: General;  Laterality: N/A;    Assessment / Plan / Recommendation Clinical Impression Pt is a 68 year old right handed male with history significant for anxiety, migraine headaches, detached retina left eye, hypertension, prostate cancer status post radical prostatectomy 2014, COPD/quit smoking 7 years ago as well as history of left common  femoral enterectomy with bilateral common iliac artery stenting and left external iliac artery stenting November 2023 at [facility]. Per chart review patient lives with spouse. 1 level home 4 steps to entry. Modified independent with a cane prior to admission. Presented 09/08/2024 with low back pain radiating to the left lower extremity. 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 Underwent left-sided L4-5 transforaminal lumbar interbody fusion/posterior lateral fusion insertion of interbody device revision L4-5 decompression 09/08/2024 per Dr.  Beuford. Noted intraoperatively patient had developed hives swelling of tongue and lips along with hypotension with critical care medicine consulted possible anaphylactic shock and was started on epi drip and given Decadron . He did require short-term intubation was transferred to the ICU and extubated 9/11. MRI T-spine with no acute findings. MRI lumbar spine showed L4-5 PLIF moderate residual foraminal stenosis. Since his surgery patient had ongoing right leg weakness and numbness neurology consulted for recommendations. MRI of the brain showed no acute changes. Neurology felt these findings consistent with either hypersensitivity to pain or mild component of nonorganic findings. Recommendations at that time were for outpatient EMG of right lower extremity numbness persisted. Therapy evaluations completed with back brace when out of bed. Therapy evaluations completed and patient was admitted to inpatient rehab services 09/15/2024 for comprehensive rehab therapies. During patient's hospitalization noted persistent pain and numbness of the right lower extremity earlier hospital course workup by neurology was unremarkable at that time. Vascular surgery Dr. Penne Colorado consulted 09/19/2024 for persistent leg pain. ABIs were not able to be done due to patient's level of pain. Monitoring of CK levels ranging from 3886-1434. Diagnostic angiography completed 09/21/2024 per Dr. 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 CT angiography chest abdomen and pelvis completed showing stable infrarenal aortic aneurysm. Due to patient's aortic occlusion findings he was discharged to acute care services 09/24/2024 and underwent right axillary bifemoral bypass graft with bilateral common femoral, superficial femoral and profundofemoral thrombectomy with redo of left common femoral artery exposure and 4 compartment right leg fasciotomy resection of necrotic muscle in the right anterior compartment with bilateral femoral enterectomy  09/24/2024 per Dr. Serene. Patient did return to the OR same day 09/24/2024 due to popliteal signal being different than what it was initially in the operating room it appeared to be monophasic thus returned back for reexploration angiography to make sure blood flow is optimized and angiography revealed a filling defect in the popliteal artery at the level of the knee. There was reconstitution of the posterior tibial artery which was dominant runoff. Peroneal artery also opacified. It was elected to reopen the medial right below-knee incision and dissected out the popliteal artery. Patient tolerated the procedure well and was sent back to the ICU. IV heparin  was initiated for arterial insufficiency. Patient did require right calf lateral fasciotomy washout debridement of skin and soft tissue vacuum assisted dressing 09/27/2024 per vascular surgery Dr. Fonda Simpers followed by excisional debridement skin soft tissue muscle and fascia anterior compartment right leg. Transfer of the lateral compartment muscles to the anterior compartment to cover the anterior tibial vessels 09/29/2024 per Dr. Harden and ultimately with right leg fasciotomy washout again completed 10/01/2024 and later completed again with excisional debridement right calf with excision of soft tissue muscle and fascia application of wound VAC 10/06/2024 per Dr. Harden.... Patient did require episodes of ongoing hypotension 10/10 order of 1 unit packed red blood cells for hemoglobin of 7.2 latest hemoglobin 7.7. Hospital course patient remains on doxycycline  for wound coverage. IV heparin  ongoing he was cleared to resume low-dose aspirin . Therapy evaluations have been completed and ongoing with slow progressive gains.  Only able to complete portions of the [name] Mental Status (SLUMS) Exam d/t 10/10 pain. He appeared to present w/ mild cognitive deficits in the areas of orientation, STM, and problem solving. Some attention deficits noted as well,  however, difficult to tell if inattention was d/t pain or true cognitive deficits. He endorsed cognitive decline, but reported it has been coming on for awhile now. Unable to confirm baseline at this time. Recommend dynamic assessment of cognition in upcoming tx sessions to determine cognitive baseline/need for continued services. Expressive/receptive language WFL. Speech ~95% intelligible throughout - anticipate pain and inattention negatively impacted this. At this time, recommend ST to target cognitive deficits, maximize pt independence, and facilitate return to prev roles/responsibilities.    Skilled Therapeutic Interventions          SLP facilitated portions of a cognitive-linguistic evaluation to assess pt's cognitive-communication skills and determine need for additional skilled ST services. See above for more information.    SLP Assessment  Patient will need skilled Speech Lanaguage Pathology Services during CIR admission    Recommendations  Patient destination: Home Follow up Recommendations:  (TBD) Equipment Recommended: None recommended by SLP    SLP Frequency 1 to 3 out of 7 days   SLP Duration  SLP Intensity  SLP Treatment/Interventions 2 weeks  Minumum of 1-2 x/day, 30 to 90 minutes  Cognitive remediation/compensation;Functional tasks;Patient/family education;Therapeutic Activities;Cueing hierarchy;Internal/external aids;Environmental controls    Pain  10/10 pain in the stomach/rectum. Nursing aware.  Prior Functioning  Baseline cognitive function unknown   SLP Evaluation Cognition Overall Cognitive Status: No family/caregiver present to determine baseline cognitive functioning Arousal/Alertness: Lethargic Orientation Level: Oriented to person;Oriented to place;Oriented to situation Year: 2024 Month: October Day of Week: Incorrect Attention: Sustained Sustained Attention: Impaired Memory: Impaired Memory Impairment: Retrieval deficit;Decreased recall of new  information Decreased Short Term Memory: Verbal basic;Functional basic Problem Solving: Impaired Problem Solving Impairment: Verbal basic  Comprehension Auditory Comprehension Overall Auditory Comprehension: Appears within functional limits for tasks assessed Expression Expression Primary Mode of Expression: Verbal Verbal Expression Overall Verbal Expression: Appears within functional limits for tasks assessed Oral Motor Oral Motor/Sensory Function Overall Oral Motor/Sensory Function: Within functional limits Motor Speech Overall Motor Speech: Appears within functional limits for tasks assessed  Care Tool Care Tool Cognition Ability to hear (with hearing aid or hearing appliances if normally used Ability to hear (with hearing aid or hearing appliances if normally used): 1. Minimal difficulty - difficulty in some environments (e.g. when person speaks softly or setting is noisy)   Expression of Ideas and Wants Expression of Ideas and Wants: 3. Some difficulty - exhibits some difficulty with expressing needs and ideas (e.g, some words or finishing thoughts) or speech is not clear   Understanding Verbal and Non-Verbal Content Understanding Verbal and Non-Verbal Content: 3. Usually understands - understands most conversations, but misses some part/intent of message. Requires cues at times to understand  Memory/Recall Ability Memory/Recall Ability : That he or she is in a hospital/hospital unit;Current season   Motor Speech Assessment  WFL    Short Term Goals: Week 1: SLP Short Term Goal 1 (Week 1): Pt will recall recent/relevant information w/ supervision SLP Short Term Goal 2 (Week 1): Pt will solve functional problems w/ superivion SLP Short Term Goal 3 (Week 1): Pt will participate in dynamic assessment of all cognitive domains to  determine additional tx goals as needed  Refer to Care Plan for Long Term Goals  Recommendations for other services: None   Discharge Criteria:  Patient will be discharged from SLP if patient refuses treatment 3 consecutive times without medical reason, if treatment goals not met, if there is a change in medical status, if patient makes no progress towards goals or if patient is discharged from hospital.  The above assessment, treatment plan, treatment alternatives and goals were discussed and mutually agreed upon: by patient  Recardo DELENA Mole 10/14/2024, 8:07 PM

## 2024-10-14 NOTE — Progress Notes (Signed)
 Physical Therapy Session Note  Patient Details  Name: David King MRN: [id number] Date of Birth: 1956/12/21  Today's Date: 10/14/2024 PT Individual Time: 1140-1150 PT Individual Time Calculation (min): 10 min   Short Term Goals: Week 1:  PT Short Term Goal 1 (Week 1): Pt will tolerate sitting upright >2 hours PT Short Term Goal 2 (Week 1): Pt will perform stedy transfers with mod a or better PT Short Term Goal 3 (Week 1): Pt will consistently perform supine<>sit with CGA  Skilled Therapeutic Interventions/Progress Updates:    Session 1:  Pt recd in bed, requesting to use bed pan. Pt assisted with rolling to place bed pan with min a overall with cueing. Pt requesting more time to complete toileting so handed off to NT at this time, needs left in reach.   Session 2: Pt being transported for x-ray on arrival. Missed x 45 min of scheduled therapy, will make up later as able.   Therapy Documentation Precautions:  Precautions Precautions: Fall, Back Precaution Booklet Issued: Yes (comment) Recall of Precautions/Restrictions: Impaired Precaution/Restrictions Comments: watch BP (orthostatic); RLE foot drop, wound VAC Required Braces or Orthoses: Spinal Brace Spinal Brace: Thoracolumbosacral orthotic, Applied in sitting position Restrictions Weight Bearing Restrictions Per Provider Order: Yes RLE Weight Bearing Per Provider Order: Weight bearing as tolerated Other Position/Activity Restrictions: transfers only on RLE General: PT Amount of Missed Time (min): 45 Minutes PT Missed Treatment Reason: Unavailable (Comment) (x-ray)     Therapy/Group: Individual Therapy  Ozie Lupe C Terrian Ridlon 10/14/2024, 4:41 PM

## 2024-10-15 DIAGNOSIS — M5416 Radiculopathy, lumbar region: Secondary | ICD-10-CM | POA: Diagnosis not present

## 2024-10-15 DIAGNOSIS — M79A21 Nontraumatic compartment syndrome of right lower extremity: Secondary | ICD-10-CM | POA: Diagnosis not present

## 2024-10-15 MED ORDER — OXYCODONE HCL ER 10 MG PO T12A
10.0000 mg | EXTENDED_RELEASE_TABLET | Freq: Every morning | ORAL | Status: DC
  Administered 2024-10-16 – 2024-10-17 (×2): 10 mg via ORAL
  Filled 2024-10-15 (×2): qty 1

## 2024-10-15 MED ORDER — ALBUTEROL SULFATE (2.5 MG/3ML) 0.083% IN NEBU
2.5000 mg | INHALATION_SOLUTION | Freq: Four times a day (QID) | RESPIRATORY_TRACT | Status: DC
  Filled 2024-10-15: qty 3

## 2024-10-15 MED ORDER — ACETAMINOPHEN 325 MG PO TABS
650.0000 mg | ORAL_TABLET | Freq: Four times a day (QID) | ORAL | Status: DC | PRN
  Administered 2024-10-16 – 2024-10-19 (×8): 650 mg via ORAL
  Filled 2024-10-15 (×8): qty 2

## 2024-10-15 MED ORDER — SORBITOL 70 % SOLN
30.0000 mL | Freq: Every day | Status: DC | PRN
  Filled 2024-10-15 (×2): qty 30

## 2024-10-15 MED ORDER — ALBUTEROL SULFATE (2.5 MG/3ML) 0.083% IN NEBU: 2.5000 mg | INHALATION_SOLUTION | RESPIRATORY_TRACT | Status: DC | PRN

## 2024-10-15 NOTE — Progress Notes (Signed)
 Occupational Therapy Session Note  Patient Details  Name: David King MRN: [id number] Date of Birth: 1956-11-02  Today's Date: 10/15/2024 OT Individual Time: 0700-0810 OT Individual Time Calculation (min): 70 min    Short Term Goals: Week 1:  OT Short Term Goal 1 (Week 1): Patient to complete LB dressing with Mod A OT Short Term Goal 2 (Week 1): Patient to perform toileting with Mod A OT Short Term Goal 3 (Week 1): Patient to perform toilet transfer with Min assist OT Short Term Goal 4 (Week 1): Pt will don/doff TLSO brace with Mod A  Skilled Therapeutic Interventions/Progress Updates:    Pt resting in bed upon arrival. Skilled OT intervention with focus on bed mobility and upright tolerance seated in bed with HOB elevated. Pt able to pull up in bed without assistance. Rolling R/L in bed using bed rails with min A. Focus on education, back precautions, med mgmt, and discharge planning. Pt concerned that pain meds causing constipation and not sure if nursing following orders. Pt concerned that there is breakdown in communication regarding med orders. Ongoing discussion with MD. Pt remained in bed eating breakfast. Pt in much better spirits this morning. No c/o lightheadedness. All needs within reach.   Therapy Documentation Precautions:  Precautions Precautions: Fall, Back Precaution Booklet Issued: Yes (comment) Recall of Precautions/Restrictions: Impaired Precaution/Restrictions Comments: watch BP (orthostatic); RLE foot drop, wound VAC Required Braces or Orthoses: Spinal Brace Spinal Brace: Thoracolumbosacral orthotic, Applied in sitting position Restrictions Weight Bearing Restrictions Per Provider Order: Yes RLE Weight Bearing Per Provider Order: Weight bearing as tolerated Other Position/Activity Restrictions: transfers only on RLE Pain:  Pt reports he feels better then previous day since he has had a BM. Pt c/o 7/10 generalized pain. Pt concerned that pain meds cause  constipation. PT and MD discussed options.    Therapy/Group: Individual Therapy  [name] 10/15/2024, 8:13 AM

## 2024-10-15 NOTE — Progress Notes (Signed)
 Physical Therapy Session Note  Patient Details  Name: LIEM COPENHAVER MRN: [id number] Date of Birth: [birth year]  Today's Date: 10/15/2024 PT Individual Time: 0845-1000 PT Individual Time Calculation (min): 75 min   Short Term Goals: Week 1:  PT Short Term Goal 1 (Week 1): Pt will tolerate sitting upright >2 hours PT Short Term Goal 2 (Week 1): Pt will perform stedy transfers with mod a or better PT Short Term Goal 3 (Week 1): Pt will consistently perform supine<>sit with CGA  Skilled Therapeutic Interventions/Progress Updates:    pt received in bed and agreeable to therapy. Donned ted hose on LLE. Pt on bed pan on arrival, min a rolling to remove and perform dependent pericare. Supine>sit with min a for balance. Monitored pt's vitals throughout, BP=94/76(83)  in supine and 74/60(67) at the lowest (symptomatic). Session focused on transitioning to TIS to improve upright tolerance. Stedy transfer with mod a x 2 to TIS chair. Therapist adjusted chair and monitored for orthostatic symptoms, adjusting tilt and leg elevation as required. Pt remained in TIS for SLP session directly after.   Therapy Documentation Precautions:  Precautions Precautions: Fall, Back Precaution Booklet Issued: Yes (comment) Recall of Precautions/Restrictions: Impaired Precaution/Restrictions Comments: watch BP (orthostatic); RLE foot drop, wound VAC Required Braces or Orthoses: Spinal Brace Spinal Brace: Thoracolumbosacral orthotic, Applied in sitting position Restrictions Weight Bearing Restrictions Per Provider Order: Yes RLE Weight Bearing Per Provider Order: Weight bearing as tolerated Other Position/Activity Restrictions: transfers only on RLE General:       Therapy/Group: Individual Therapy  [name] 10/15/2024, 4:31 PM

## 2024-10-15 NOTE — Progress Notes (Signed)
 Speech Language Pathology Daily Session Note  Patient Details  Name: David King MRN: [id number] Date of Birth: Aug 16, 1956  Today's Date: 10/15/2024 SLP Individual Time: 1000-1053 SLP Individual Time Calculation (min): 53 min  Short Term Goals: Week 1: SLP Short Term Goal 1 (Week 1): Pt will recall recent/relevant information w/ supervision SLP Short Term Goal 2 (Week 1): Pt will solve functional problems w/ superivion SLP Short Term Goal 3 (Week 1): Pt will participate in dynamic assessment of all cognitive domains to determine additional tx goals as needed  Skilled Therapeutic Interventions: Skilled therapy session focused on cognitive goals. SLP facilitated session by prompting patient to share biographical information along with medical hx. Patient independently shared biographical information re: self, job and family, however with difficulty sequencing medical hx. SLP further evaluated patients cognitive skills through completion of the [employer]ogn[employer]at standardized assessment. Patient with strengths in orientation, attention, awareness and receptive/expressive language. Patient with mild deficits in problem solving and moderate deficits in memory. SLP continued to target cognition through introduction of memory book. Towards the end of the session, patient requested transfer to bed for bowel movement. Patient followed all single step directions and attended to task during transfer. Remaining 7 minutes missed due to toileting. Patient left in bed with alarm set and call bell in reach. Continue POC.    Pain None reported   Therapy/Group: Individual Therapy  Haruye Lainez M.A., CCC-SLP 10/15/2024, 7:39 AM

## 2024-10-15 NOTE — Progress Notes (Signed)
 Correction pt doesn't have nor does her require a telesitter

## 2024-10-15 NOTE — Plan of Care (Signed)
  Problem: SCI BOWEL ELIMINATION Goal: RH STG MANAGE BOWEL WITH ASSISTANCE Description: STG Manage Bowel with supervision Assistance. Outcome: Progressing   Problem: SCI BLADDER ELIMINATION Goal: RH STG MANAGE BLADDER WITH ASSISTANCE Description: STG Manage Bladder With supervision Assistance Outcome: Progressing   Problem: RH SKIN INTEGRITY Goal: RH STG SKIN FREE OF INFECTION/BREAKDOWN Description: Manage skin free of infection with supervision assistance Outcome: Progressing   Problem: RH SAFETY Goal: RH STG ADHERE TO SAFETY PRECAUTIONS W/ASSISTANCE/DEVICE Description: STG Adhere to Safety Precautions With Assistance/Device. Outcome: Progressing   Problem: RH PAIN MANAGEMENT Goal: RH STG PAIN MANAGED AT OR BELOW PT'S PAIN GOAL Description: < 4 w/ prns Outcome: Progressing   Problem: RH KNOWLEDGE DEFICIT SCI Goal: RH STG INCREASE KNOWLEDGE OF SELF CARE AFTER SCI Description: Manage increase knowledge of self care after SCI with supervision assistance from wife using educational materials provided Outcome: Progressing

## 2024-10-15 NOTE — Progress Notes (Signed)
 PROGRESS NOTE   Subjective/Complaints:  Feeling better today than yesterday- a huge BM and huge relief with the BM. Pt says would rather deal with pain than take Oxycodone  of any kind- wants ot come off it completley and take tylenol .   Had been having slightly increased LFTs so they had stopped tylenol , but only up to 60 for ALT, so will restart and monitor LFTs.  Pt ok with tapering off Oxycontin - went over could have some withdrawal since has been on for a few weeks.  Over 2-3 days, but doesn't want  Oxycodone  prn at all. Stopped it.   Feeling constipated again per pt- but not clear if scared of constipation or feeling constipated?  Said receiving nebs sometimes-  but per chart, hasn't received since came to rehab/albuterol .     ROS: Per HPI    Pt denies SOB, abd pain, CP, N/V/ lessC/D, and vision changes   Objective:   DG Chest 2 View Result Date: 10/14/2024 CLINICAL DATA:  Follow-up left basilar opacity EXAM: CHEST - 2 VIEW COMPARISON:  10/13/2024 FINDINGS: Cardiac shadow is stable. Aortic calcifications are seen. Changes of prior midthoracic vertebral augmentation noted. Improved aeration in the left base is noted. Minimal right basilar atelectasis is seen. No bony abnormality is noted. IMPRESSION: Mild right basilar atelectasis. Electronically Signed   By: Oneil Devonshire M.D.   On: 10/14/2024 15:21   DG CHEST PORT 1 VIEW Result Date: 10/13/2024 EXAM: 1 VIEW XRAY OF THE CHEST 10/13/2024 06:38:00 PM COMPARISON: Chest x-ray 10/06/2024. CLINICAL HISTORY: Altered mental status. FINDINGS: LUNGS AND PLEURA: Patchy opacities in the left lung base. Increased central interstitial markings bilaterally. No pulmonary edema. No pleural effusion. No pneumothorax. HEART AND MEDIASTINUM: Ectatic aorta with atherosclerotic calcifications. Calcified mediastinal lymph node, unchanged. No acute abnormality of the cardiac and mediastinal  silhouettes. BONES AND SOFT TISSUES: No acute osseous abnormality. IMPRESSION: 1. Patchy opacities in the left lung base and increased central interstitial markings bilaterally to mild edema or infectious/inflammatory process . Electronically signed by: Greig Pique MD 10/13/2024 07:42 PM EDT RP Workstation: [id number]   Recent Labs    10/13/24 2034 10/14/24 0530  WBC 13.2* 12.7*  HGB 9.1* 9.0*  HCT 28.8* 29.2*  PLT 204 189   Recent Labs    10/13/24 0533 10/14/24 0530  NA 134* 135  K 3.7 4.2  CL 98 101  CO2 24 23  GLUCOSE 101* 122*  BUN 19 16  CREATININE 0.91 0.90  CALCIUM  8.9 8.9    Intake/Output Summary (Last 24 hours) at 10/15/2024 0857 Last data filed at 10/15/2024 0849 Gross per 24 hour  Intake 240 ml  Output 250 ml  Net -10 ml     Wound 10/11/24 1630 Pressure Injury Heel Right Unstageable - Full thickness tissue loss in which the base of the injury is covered by slough (yellow, tan, gray, green or brown) and/or eschar (tan, brown or black) in the wound bed. (Active)    Physical Exam: Vital Signs Blood pressure 99/78, pulse 86, temperature 97.9 F (36.6 C), resp. rate 18, height 5' 9 (1.753 m), weight 86.2 kg, SpO2 91%.       General: awake, alert, appropriate; sitting  up in bed; wearing L prevalon and not R for some reason; OT in room; NAD HENT: conjugate gaze; oropharynx dry CV: regular rate and rhythm; no JVD Pulmonary: diffuse wheezing this AM GI: soft, NT, still somewhat distended; more normoactive, but still not normal Psychiatric: appropriate- more interactive Neurological: Ox3 VAC stable amount of sanguinous drainage in cannister- no increased amount form yesterday    General: Skin is warm.     Multiple wounds as below                              Neurological:     Comments: Patient is alert.  Complains of right leg pain.  Oriented x 3 and follows commands. CN exam non-focal. Fair insight and awareness. MMT: BUE 5/5. LLE  limited by  pain but appears to be moving all muscle groups. RLE limited by wounds/pain. Sensory exam normal for light touch and pain except for right foot where there is diminished LT along dorsum and to a lesser extent plantar aspect of foot.  No limb ataxia or cerebellar signs. No abnormal tone appreciated.    Psychiatric:     Comments: Cooperative but anxious        Assessment/Plan: 1. Functional deficits which require 3+ hours per day of interdisciplinary therapy in a comprehensive inpatient rehab setting. Physiatrist is providing close team supervision and 24 hour management of active medical problems listed below. Physiatrist and rehab team continue to assess barriers to discharge/monitor patient progress toward functional and medical goals  Care Tool:  Bathing    Body parts bathed by patient: Right arm, Left arm, Chest, Abdomen, Face   Body parts bathed by helper: Front perineal area, Buttocks, Right upper leg, Left lower leg, Right lower leg, Left upper leg     Bathing assist Assist Level: Total Assistance - Patient < 25%     Upper Body Dressing/Undressing Upper body dressing   What is the patient wearing?: Hospital gown only    Upper body assist Assist Level: Moderate Assistance - Patient 50 - 74%    Lower Body Dressing/Undressing Lower body dressing            Lower body assist Assist for lower body dressing: Total Assistance - Patient < 25%     Toileting Toileting    Toileting assist Assist for toileting: Total Assistance - Patient < 25%     Transfers Chair/bed transfer  Transfers assist  Chair/bed transfer activity did not occur: Safety/medical concerns  Chair/bed transfer assist level: 2 Helpers     Locomotion Ambulation   Ambulation assist   Ambulation activity did not occur: Safety/medical concerns          Walk 10 feet activity   Assist  Walk 10 feet activity did not occur: Safety/medical concerns        Walk 50 feet  activity   Assist Walk 50 feet with 2 turns activity did not occur: Safety/medical concerns         Walk 150 feet activity   Assist Walk 150 feet activity did not occur: Safety/medical concerns         Walk 10 feet on uneven surface  activity   Assist Walk 10 feet on uneven surfaces activity did not occur: Safety/medical concerns         Wheelchair     Assist Is the patient using a wheelchair?: Yes Type of Wheelchair: Manual Wheelchair activity did not occur: Safety/medical concerns  Wheelchair 50 feet with 2 turns activity    Assist    Wheelchair 50 feet with 2 turns activity did not occur: Safety/medical concerns       Wheelchair 150 feet activity     Assist  Wheelchair 150 feet activity did not occur: Safety/medical concerns       Blood pressure 99/78, pulse 86, temperature 97.9 F (36.6 C), resp. rate 18, height 5' 9 (1.753 m), weight 86.2 kg, SpO2 91%.  Medical Problem List and Plan: 1. Functional deficits secondary to PAD/compartment syndrome/right leg ischemia/status post thrombectomy.  Status post right leg anterior compartment fasciotomy 9/26 with multiple washouts as well as excisional debridement/wound VAC.  Patient is completing a course of doxycycline             -patient may not yet shower             -ELOS/Goals: 10-14 days, supervision goals  D/c 2-3 weeks  Con't CIR PT, OT and SLP 2.  Antithrombotics: -DVT/anticoagulation:  Pharmaceutical: Heparin  awaiting plan to possibly transition to Eliquis today 10/11/2024 10/14- con't Eliquis             -antiplatelet therapy: Aspirin  81 mg daily 3. Pain Management: Lyrica  150 mg 3 times daily, OxyContin  sustained-release 10 mg every 12 hours, oxycodone  10 mg every 4 hours as needed breakthrough pain Advil  400 mg every 4 hours as needed  10/14- pain is doing better- overall   10/15- pain still skyrockets with therapy and had to stop today due to pain  10/17- refused to take  anymore prn oxy- agreed to let me keep him on Oxycontin  qam and taper after a few days- due to constipation- said severe and doesn't want anything by tylenol - need to wean Oxycontin  off Monday as well- if pain skyrockets, can restart if needed 4. Mood/Behavior/Sleep: Cymbalta  30 mg nightly, Celexa  20 mg daily             -antipsychotic agents: N/A 5. Neuropsych/cognition: This patient is capable of making decisions on his own behalf. 6. Skin/Wound Care: Routine skin checks             -vac management per WOC RN and Dr. Harden             -local care and pressure relief as needed to multiple other wounds.             -prevalon boot/pressure relief, right heel wound 7. Fluids/Electrolytes/Nutrition: Routine and analysis with follow-up chemistries 8. lumbar radiculopathy/large disc herniation spinal stenosis.  Status post L4-5 transforaminal interbody fusion/posterior lateral fusion insertion interbody device L4-5 decompression 09/08/2024 per Dr. Beuford.  Back brace when out of bed    9.  Acute blood loss anemia.  Follow-up CBC  10/14- Pt has ~ 150-200cc out of VAC- not sure since when? Hb down 1/2 unit from 8.9 to 8.4- will recheck Thursday  10/15- Hb will check in AM  10/17- Hb 9.0 10.  AKI.  Resolved after IV hydration. 11.  Hyperlipidemia.  Crestor  12.  COPD/remote tobacco use.  Monitor oxygen  saturations every shift  10/15- was on O2 this AM- per nursing from overnight only- but Sats 89-91%- getting CXR and ABG to see where he is- and getting sepsis work up.   10/16- started on Unasyn and will start Mucinex for coarse cough-  13.  History of prostate cancer/urinary retention.  History of status post prostatectomy.  External catheter currently in place.  Continue Flomax  0.4 mg daily  10/14- is voiding-  but had urinary retention last time- will check a few bladder scans.   10/15 - pt needing in/out caths- had >1400cc in bladder- has cathing protocol now  10/16-10/17 has orders to bladder scan  and in/out if volumes >350cc- requiring caths sometimes 14.  History of detached retina left eye.  Follow-up outpatient ophthalmology. 15. Severe constipation  10/14- LBM at least 4-5 days ago per documentation- pt doesn't remember when went last- will get KUB- and order Sorbitol  30cc- and SSE if no results- having abd pain.   10/16- Pt had 1 liquid stool- but otherwise, no other stools in 6-7 days- last KUB shows on 10/14 was full of stool- will give Sorbitol  since likely has hard stool that liquid getting by- and SSE- if that doesn't work, will ask nursing to do Mg citrate.   10/17- had massive BM  per pt- per chart 1 large, 1 medium-  16.  Hypokalemia  10/14- K+ 3.2- will replete KCL 40 Meq x1.  17. Low grade temp in setting of delirium  10/15- will get sepsis work up, ammonia level and ABG  10/17- has HCAP- Per #21 18. Cirrhosis on last Abd imaging  10/15- will get Ammonia as well esp with new jerking/tremors.   10/16- is actually shivering this AM, but isn't cold- Ammonia 16- not the cause of Sx's.  19. Infrarenal aortic aneurysm with mural thrombus- 6.8 cm on last imaging   20. Orthostatic hypotension  10/16- On Flomax , however No BP meds that I can reduce- will check with therapy- if dropping more, will add Midodrine- ordering ACE wrap on LLE  -addendum- adding Midodrine 5 mg TID starting at 630 am  10/17- will see how BP does today 21. HCAP  10/16- on Unasyn-  will con't for 5-6 days- monitor WBC- down to 12.7 from 13.2 last evening. Shivering could be due to breaking a temp>>??  10/17- no tremors/shivering- will recheck CBC in AM- and make sure on right ABX-  and CMP due to LFTS/renal check on IV ABX- Added Albuterol  scheduled QID due to wheezing and bad COPD   I spent a total of  52  minutes on total care today- >50% coordination of care- due to  D/w pt at length about Oxy and constipation, bowel meds; d/w OT- and review of vitals, and B/B- also orders for HCAP and adding  albuterol   LOS: 4 days A FACE TO FACE EVALUATION WAS PERFORMED  Carlynn Leduc 10/15/2024, 8:57 AM

## 2024-10-16 DIAGNOSIS — T79A21D Traumatic compartment syndrome of right lower extremity, subsequent encounter: Secondary | ICD-10-CM

## 2024-10-16 DIAGNOSIS — R339 Retention of urine, unspecified: Secondary | ICD-10-CM | POA: Diagnosis not present

## 2024-10-16 DIAGNOSIS — J189 Pneumonia, unspecified organism: Secondary | ICD-10-CM

## 2024-10-16 DIAGNOSIS — M5416 Radiculopathy, lumbar region: Secondary | ICD-10-CM | POA: Diagnosis not present

## 2024-10-16 LAB — CBC WITH DIFFERENTIAL/PLATELET
Abs Immature Granulocytes: 0.2 K/uL — ABNORMAL HIGH (ref 0.00–0.07)
Basophils Absolute: 0.1 K/uL (ref 0.0–0.1)
Basophils Relative: 1 %
Eosinophils Absolute: 0.3 K/uL (ref 0.0–0.5)
Eosinophils Relative: 3 %
HCT: 26.6 % — ABNORMAL LOW (ref 39.0–52.0)
Hemoglobin: 8.4 g/dL — ABNORMAL LOW (ref 13.0–17.0)
Immature Granulocytes: 2 %
Lymphocytes Relative: 15 %
Lymphs Abs: 1.2 K/uL (ref 0.7–4.0)
MCH: 29.9 pg (ref 26.0–34.0)
MCHC: 31.6 g/dL (ref 30.0–36.0)
MCV: 94.7 fL (ref 80.0–100.0)
Monocytes Absolute: 0.6 K/uL (ref 0.1–1.0)
Monocytes Relative: 7 %
Neutro Abs: 6 K/uL (ref 1.7–7.7)
Neutrophils Relative %: 72 %
Platelets: 202 K/uL (ref 150–400)
RBC: 2.81 MIL/uL — ABNORMAL LOW (ref 4.22–5.81)
RDW: 18.6 % — ABNORMAL HIGH (ref 11.5–15.5)
WBC: 8.3 K/uL (ref 4.0–10.5)
nRBC: 0 % (ref 0.0–0.2)

## 2024-10-16 LAB — COMPREHENSIVE METABOLIC PANEL WITH GFR
ALT: 40 U/L (ref 0–44)
AST: 60 U/L — ABNORMAL HIGH (ref 15–41)
Albumin: 2.1 g/dL — ABNORMAL LOW (ref 3.5–5.0)
Alkaline Phosphatase: 76 U/L (ref 38–126)
Anion gap: 12 (ref 5–15)
BUN: 20 mg/dL (ref 8–23)
CO2: 20 mmol/L — ABNORMAL LOW (ref 22–32)
Calcium: 8.1 mg/dL — ABNORMAL LOW (ref 8.9–10.3)
Chloride: 103 mmol/L (ref 98–111)
Creatinine, Ser: 0.81 mg/dL (ref 0.61–1.24)
GFR, Estimated: >60 mL/min (ref >60)
Glucose, Bld: 91 mg/dL (ref 70–99)
Potassium: 4 mmol/L (ref 3.5–5.1)
Sodium: 135 mmol/L (ref 135–145)
Total Bilirubin: 1.6 mg/dL — ABNORMAL HIGH (ref 0.0–1.2)
Total Protein: 5.6 g/dL — ABNORMAL LOW (ref 6.5–8.1)

## 2024-10-16 MED ORDER — POLYETHYLENE GLYCOL 3350 17 G PO PACK
17.0000 g | PACK | Freq: Two times a day (BID) | ORAL | Status: DC
  Administered 2024-10-16 – 2024-10-23 (×7): 17 g via ORAL
  Filled 2024-10-16 (×12): qty 1

## 2024-10-16 NOTE — Progress Notes (Signed)
 PROGRESS NOTE   Subjective/Complaints:  Pt with fair night. Having pain as narcotics are being decreased. Was able to sleep. Having occasional cough.   ROS: Patient denies fever, rash, sore throat, blurred vision, dizziness, nausea, vomiting, diarrhea, cough, shortness of breath or chest pain,  headache, or mood change.    Objective:   DG Chest 2 View Result Date: 10/14/2024 CLINICAL DATA:  Follow-up left basilar opacity EXAM: CHEST - 2 VIEW COMPARISON:  10/13/2024 FINDINGS: Cardiac shadow is stable. Aortic calcifications are seen. Changes of prior midthoracic vertebral augmentation noted. Improved aeration in the left base is noted. Minimal right basilar atelectasis is seen. No bony abnormality is noted. IMPRESSION: Mild right basilar atelectasis. Electronically Signed   By: Oneil Devonshire M.D.   On: 10/14/2024 15:21   Recent Labs    10/14/24 0530 10/16/24 0516  WBC 12.7* 8.3  HGB 9.0* 8.4*  HCT 29.2* 26.6*  PLT 189 202   Recent Labs    10/14/24 0530 10/16/24 0516  NA 135 135  K 4.2 4.0  CL 101 103  CO2 23 20*  GLUCOSE 122* 91  BUN 16 20  CREATININE 0.90 0.81  CALCIUM  8.9 8.1*    Intake/Output Summary (Last 24 hours) at 10/16/2024 1110 Last data filed at 10/16/2024 0900 Gross per 24 hour  Intake 1120 ml  Output 1826 ml  Net -706 ml     Wound 10/11/24 1630 Pressure Injury Heel Right Unstageable - Full thickness tissue loss in which the base of the injury is covered by slough (yellow, tan, gray, green or brown) and/or eschar (tan, brown or black) in the wound bed. (Active)    Physical Exam: Vital Signs Blood pressure 132/65, pulse 79, temperature 97.6 F (36.4 C), temperature source Oral, resp. rate 15, height 5' 9 (1.753 m), weight 86.2 kg, SpO2 92%.       Constitutional: No distress . Vital signs reviewed. HEENT: NCAT, EOMI, oral membranes moist Neck: supple Cardiovascular: RRR without murmur. No  JVD    Respiratory/Chest: CTA Bilaterally without wheezes; crackles at right base. Normal effort    GI/Abdomen: BS +, non-tender, non-distended Ext: no clubbing, cyanosis, or edema Psych: flat but cooperative  Neurological: Ox3 VAC ongoing sanguinous drainage in cannister- no increased amount form yesterday. Good seal.     General: Skin is warm.     Multiple wounds as below                              Neurological:     Comments: Patient is alert.     Oriented x 3 and follows commands. CN exam non-focal. Fair insight and awareness. MMT: BUE 5/5. LLE limited by  pain but appears to be moving all muscle groups. RLE limited by wounds/pain. Sensory exam normal for light touch and pain except for right foot where there is diminished LT along dorsum and to a lesser extent plantar aspect of foot.  No limb ataxia or cerebellar signs. No abnormal tone appreciated.    Prior neuro assessment is c/w 10/16/2024 exam.         Assessment/Plan: 1. Functional deficits which require 3+  hours per day of interdisciplinary therapy in a comprehensive inpatient rehab setting. Physiatrist is providing close team supervision and 24 hour management of active medical problems listed below. Physiatrist and rehab team continue to assess barriers to discharge/monitor patient progress toward functional and medical goals  Care Tool:  Bathing    Body parts bathed by patient: Right arm, Left arm, Chest, Abdomen, Face   Body parts bathed by helper: Front perineal area, Buttocks, Right upper leg, Left lower leg, Right lower leg, Left upper leg     Bathing assist Assist Level: Total Assistance - Patient < 25%     Upper Body Dressing/Undressing Upper body dressing   What is the patient wearing?: Hospital gown only    Upper body assist Assist Level: Moderate Assistance - Patient 50 - 74%    Lower Body Dressing/Undressing Lower body dressing            Lower body assist Assist for lower  body dressing: Total Assistance - Patient < 25%     Toileting Toileting    Toileting assist Assist for toileting: Total Assistance - Patient < 25%     Transfers Chair/bed transfer  Transfers assist  Chair/bed transfer activity did not occur: Safety/medical concerns  Chair/bed transfer assist level: 2 Helpers     Locomotion Ambulation   Ambulation assist   Ambulation activity did not occur: Safety/medical concerns          Walk 10 feet activity   Assist  Walk 10 feet activity did not occur: Safety/medical concerns        Walk 50 feet activity   Assist Walk 50 feet with 2 turns activity did not occur: Safety/medical concerns         Walk 150 feet activity   Assist Walk 150 feet activity did not occur: Safety/medical concerns         Walk 10 feet on uneven surface  activity   Assist Walk 10 feet on uneven surfaces activity did not occur: Safety/medical concerns         Wheelchair     Assist Is the patient using a wheelchair?: Yes Type of Wheelchair: Manual Wheelchair activity did not occur: Safety/medical concerns         Wheelchair 50 feet with 2 turns activity    Assist    Wheelchair 50 feet with 2 turns activity did not occur: Safety/medical concerns       Wheelchair 150 feet activity     Assist  Wheelchair 150 feet activity did not occur: Safety/medical concerns       Blood pressure 132/65, pulse 79, temperature 97.6 F (36.4 C), temperature source Oral, resp. rate 15, height 5' 9 (1.753 m), weight 86.2 kg, SpO2 92%.  Medical Problem List and Plan: 1. Functional deficits secondary to PAD/compartment syndrome/right leg ischemia/status post thrombectomy.  Status post right leg anterior compartment fasciotomy 9/26 with multiple washouts as well as excisional debridement/wound VAC.  Patient is completing a course of doxycycline             -patient may not yet shower             -ELOS/Goals: 10-14 days,  supervision goals  D/c 2-3 weeks  -Continue CIR therapies including PT, OT, and SLP  2.  Antithrombotics: -DVT/anticoagulation:  Pharmaceutical: Heparin  awaiting plan to possibly transition to Eliquis today 10/11/2024 10/14- con't Eliquis             -antiplatelet therapy: Aspirin  81 mg daily 3. Pain Management: Lyrica   150 mg 3 times daily, OxyContin  sustained-release 10 mg every 12 hours, oxycodone  10 mg every 4 hours as needed breakthrough pain Advil  400 mg every 4 hours as needed  10/14- pain is doing better- overall   10/15- pain still skyrockets with therapy and had to stop today due to pain  10/17- refused to take anymore prn oxy- agreed to let me keep him on Oxycontin  qam and taper after a few days- due to constipation- said severe and doesn't want anything by tylenol - need to wean Oxycontin  off Monday as well- if pain skyrockets, can restart if needed  10/18 pt doing fairly well, only on oxy cr 10mg  q12 4. Mood/Behavior/Sleep: Cymbalta  30 mg nightly, Celexa  20 mg daily             -antipsychotic agents: N/A 5. Neuropsych/cognition: This patient is capable of making decisions on his own behalf. 6. Skin/Wound Care: Routine skin checks             -vac management per WOC RN and Dr. Bianca changes             -local care and pressure relief as needed to multiple other wounds.             -prevalon boot/pressure relief, right heel wound 7. Fluids/Electrolytes/Nutrition: Routine and analysis with follow-up chemistries 8. lumbar radiculopathy/large disc herniation spinal stenosis.  Status post L4-5 transforaminal interbody fusion/posterior lateral fusion insertion interbody device L4-5 decompression 09/08/2024 per Dr. Beuford.  Back brace when out of bed    9.  Acute blood loss anemia.  Follow-up CBC  10/14- Pt has ~ 150-200cc out of VAC- not sure since when? Hb down 1/2 unit from 8.9 to 8.4- will recheck Thursday  10/15- Hb will check in AM  10/17- Hb 9.0 10.  AKI.  Resolved after IV  hydration. 11.  Hyperlipidemia.  Crestor  12.  COPD/remote tobacco use.  Monitor oxygen  saturations every shift  10/15- was on O2 this AM- per nursing from overnight only- but Sats 89-91%- getting CXR and ABG to see where he is- and getting sepsis work up.   10/16- started on Unasyn and will start Mucinex for coarse cough-  13.  History of prostate cancer/urinary retention.  History of status post prostatectomy.  External catheter currently in place.  Continue Flomax  0.4 mg daily  10/14- is voiding- but had urinary retention last time- will check a few bladder scans.   10/15 - pt needing in/out caths- had >1400cc in bladder- has cathing protocol now  10/16-10/17 has orders to bladder scan and in/out if volumes >350cc- requiring caths sometimes  10/18 may need to cath more frequently if he's having volumes >1000cc 14.  History of detached retina left eye.  Follow-up outpatient ophthalmology. 15. Severe constipation  10/14- LBM at least 4-5 days ago per documentation- pt doesn't remember when went last- will get KUB- and order Sorbitol  30cc- and SSE if no results- having abd pain.   10/16- Pt had 1 liquid stool- but otherwise, no other stools in 6-7 days- last KUB shows on 10/14 was full of stool- will give Sorbitol  since likely has hard stool that liquid getting by- and SSE- if that doesn't work, will ask nursing to do Mg citrate.   10/17- had massive BM  per pt- per chart 1 large, 1 medium-   10/18 smear yesterday. Continue senna-s and increase miralax  to bid. 16.  Hypokalemia  10/14- K+ 3.2- will replete KCL 40 Meq x1.  17. Low grade  temp in setting of delirium  10/15- will get sepsis work up, ammonia level and ABG  10/17- has HCAP- Per #21 18. Cirrhosis on last Abd imaging  10/15- will get Ammonia as well esp with new jerking/tremors.   10/16- is actually shivering this AM, but isn't cold- Ammonia 16- not the cause of Sx's.  19. Infrarenal aortic aneurysm with mural thrombus- 6.8 cm on last  imaging   20. Orthostatic hypotension  10/16- On Flomax , however No BP meds that I can reduce- will check with therapy- if dropping more, will add Midodrine- ordering ACE wrap on LLE  -addendum- adding Midodrine 5 mg TID starting at 630 am  10/17- will see how BP does today 21. HCAP  10/16- on Unasyn-  will con't for 5-6 days- monitor WBC- down to 12.7 from 13.2 last evening. Shivering could be due to breaking a temp>>??  10/17- no tremors/shivering- will recheck CBC in AM- and make sure on right ABX-  and CMP due to LFTS/renal check on IV ABX- Added Albuterol  scheduled QID due to wheezing and bad COPD  10/18 wbc's down to 8.3, afebrile   -continue unasyn, nebs   -encouraged IS, OOB   -F/u labs Monday    -see #12     LOS: 5 days A FACE TO FACE EVALUATION WAS PERFORMED  Arthea ONEIDA Gunther 10/16/2024, 11:10 AM

## 2024-10-16 NOTE — Progress Notes (Signed)
 Occupational Therapy Session Note  Patient Details  Name: David King MRN: [id number] Date of Birth: Mar 05, 1956  Today's Date: 10/16/2024 OT Individual Time: 8694-8684 OT Individual Time Calculation (min): 10 min  and Today's Date: 10/16/2024 OT Missed Time: 15 Minutes Missed Time Reason: Nursing care   Short Term Goals: Week 1:  OT Short Term Goal 1 (Week 1): Patient to complete LB dressing with Mod A OT Short Term Goal 2 (Week 1): Patient to perform toileting with Mod A OT Short Term Goal 3 (Week 1): Patient to perform toilet transfer with Min assist OT Short Term Goal 4 (Week 1): Pt will don/doff TLSO brace with Mod A  Skilled Therapeutic Interventions/Progress Updates:   Pt greeted resting in bed, bladder scan completed during session due to patient not having void in 8hrs. OOB activity deferred this session due to no +2 assistance available and multiple interruptions. BLE exercises attempted for carryover in BADL and functional transfer safety. Pt completing 1x10 leg extensions before RN present to complete cathing and wound care. Session therefore ended and patient missing ~15 mins of skilled intervention to be made up as appropriate.   Therapy Documentation Precautions:  Precautions Precautions: Fall, Back Precaution Booklet Issued: Yes (comment) Recall of Precautions/Restrictions: Impaired Precaution/Restrictions Comments: watch BP (orthostatic); RLE foot drop, wound VAC Required Braces or Orthoses: Spinal Brace Spinal Brace: Thoracolumbosacral orthotic, Applied in sitting position Restrictions Weight Bearing Restrictions Per Provider Order: Yes RLE Weight Bearing Per Provider Order: Weight bearing as tolerated Other Position/Activity Restrictions: transfers only on RLE   Therapy/Group: Individual Therapy  Nereida Habermann, OTR/L, MSOT  10/16/2024, 12:48 PM

## 2024-10-16 NOTE — Progress Notes (Signed)
 Orthopedic Tech Progress Note Patient Details:  David King 03/23/1956 [id number]  Ortho Devices Type of Ortho Device: Prafo boot/shoe Ortho Device/Splint Location: RLE Ortho Device/Splint Interventions: Application   Post Interventions Patient Tolerated: Well  Massie BRAVO [name] 10/16/2024, 3:09 PM

## 2024-10-16 NOTE — Progress Notes (Signed)
 Occupational Therapy Session Note  Patient Details  Name: David King MRN: [id number] Date of Birth: [birth year]  Today's Date: 10/16/2024 OT Individual Time: 1015-1110 OT Individual Time Calculation (min): 55 min    Short Term Goals: Week 1:  OT Short Term Goal 1 (Week 1): Patient to complete LB dressing with Mod A OT Short Term Goal 2 (Week 1): Patient to perform toileting with Mod A OT Short Term Goal 3 (Week 1): Patient to perform toilet transfer with Min assist OT Short Term Goal 4 (Week 1): Pt will don/doff TLSO brace with Mod A  Skilled Therapeutic Interventions/Progress Updates:    Pt sleeping in bed upon arrival. Mod verbal cues to arouse and approx 10 mins to fully wake. Pt stated that earlier sitting EOB with PT had been good but exhausting. Pt agreeable to repositioning in bed and BUE therex. Educated pt on purpose of Prevalon boots and correct positioning on LE when boots donned. Pt able to pull self up in bed using bed rails. BUE therex with 4# bar-circuit therex reaching out and up 3x10 with rest breaks. Pt remained in bed with all needs within reach.   Therapy Documentation Precautions:  Precautions Precautions: Fall, Back Precaution Booklet Issued: Yes (comment) Recall of Precautions/Restrictions: Impaired Precaution/Restrictions Comments: watch BP (orthostatic); RLE foot drop, wound VAC Required Braces or Orthoses: Spinal Brace Spinal Brace: Thoracolumbosacral orthotic, Applied in sitting position Restrictions Weight Bearing Restrictions Per Provider Order: Yes RLE Weight Bearing Per Provider Order: Weight bearing as tolerated Other Position/Activity Restrictions: transfers only on RLE    Pain:  Pt denies pain this morning  Therapy/Group: Individual Therapy  David King 10/16/2024, 12:07 PM

## 2024-10-16 NOTE — Plan of Care (Signed)
  Problem: RH SAFETY Goal: RH STG ADHERE TO SAFETY PRECAUTIONS W/ASSISTANCE/DEVICE Description: STG Adhere to Safety Precautions With Assistance/Device. Outcome: Progressing   Problem: RH PAIN MANAGEMENT Goal: RH STG PAIN MANAGED AT OR BELOW PT'S PAIN GOAL Description: <4 w/ prns Outcome: Progressing

## 2024-10-16 NOTE — Progress Notes (Signed)
 Speech Language Pathology Daily Session Note  Patient Details  Name: David King MRN: [id number] Date of Birth: 1956/11/10  Today's Date: 10/16/2024 SLP Individual Time: 1400-1445 SLP Individual Time Calculation (min): 45 min  Short Term Goals: Week 1: SLP Short Term Goal 1 (Week 1): Pt will recall recent/relevant information w/ supervision SLP Short Term Goal 2 (Week 1): Pt will solve functional problems w/ superivion SLP Short Term Goal 3 (Week 1): Pt will participate in dynamic assessment of all cognitive domains to determine additional tx goals as needed  Skilled Therapeutic Interventions: Skilled therapy session focused on cognitive goals. SLP facilitated session by encouraging patient to write activities completed thus far  in memory book. Patient independently recalled today's date and in depth activities completed in 3/4 tx sessions today. SLP then educated patient on WRAP (write, repeat, associate, picture) memory strategies and provided handout to aid in recall. Patient left in bed with alarm set and call bell in reach. Continue POC.    Pain None reported to SLP  Therapy/Group: Individual Therapy  [name] M.A., CCC-SLP 10/16/2024, 7:48 AM

## 2024-10-16 NOTE — Progress Notes (Signed)
 Physical Therapy Session Note  Patient Details  Name: David King MRN: [id number] Date of Birth: 09-27-1956  Today's Date: 10/16/2024 PT Individual Time: 0802-0916 PT Individual Time Calculation (min): 74 min   Short Term Goals: Week 1:  PT Short Term Goal 1 (Week 1): Pt will tolerate sitting upright >2 hours PT Short Term Goal 2 (Week 1): Pt will perform stedy transfers with mod a or better PT Short Term Goal 3 (Week 1): Pt will consistently perform supine<>sit with CGA   Skilled Therapeutic Interventions/Progress Updates:  Patient supine in bed on entrance to room. Patient alert and agreeable to PT session. Has breakfast tray but has only eaten banana and had coffee. Relates jaw pain that has limited his ability to open mouth for bites of food.   Patient with no pain complaint at start of session. Does expect some pain with mobility.   Pt's R ankle noted to be stuck at bottom of bed in PEVALON boot but also in inverted positioning. Pt gently repositioned for neutral ankle/ foot position. Noted difficulty in bringing DF to neutral ankle position. Pt would like to sit up off of bed surface. Suggested for pt to sit up on EOB in order to attempt to eat breakfast - pt amenable.   Therapeutic Activity:  Pt performed supine > sit with MinA/ CGA. Provided line assist for wound vac. Pt able to sit on EOB for with no s/s of orthostasis. He is able to eat all of breakfast that he can tolerate. Also takes meds while seated upright. Requires supervision for balance, fatigue. After completing breakfast, pt willing to attempt standing and assisted with 3-musketeer support to stand with ModA +2. Is able to maintain with related RLE pain for 10sec prior to request to return to sit. Pt requires MinA for BLE assist back to bed surface. Then relates feeling lump behind low back. Is able to use LLE to bridge hips from bed surface and all linens and brief smoothed out for improved comfort with 3x bridge  performance and CGA. Pt is able to pull self up to [facility] with CGA using BUE. Positioned RLE in [facility] boot for comfort and heel offloading. Msg sent to MD for [facility] order.   Patient supine in bed at end of session with brakes locked, and all needs within reach. Discussion with NT re: potential for pt to sit up on EOB for meals but will require consistent supervision for fatigue/ signs of orthostasis.    Therapy Documentation Precautions:  Precautions Precautions: Fall, Back Precaution Booklet Issued: Yes (comment) Recall of Precautions/Restrictions: Impaired Precaution/Restrictions Comments: watch BP (orthostatic); RLE foot drop, wound VAC Required Braces or Orthoses: Spinal Brace Spinal Brace: Thoracolumbosacral orthotic, Applied in sitting position Restrictions Weight Bearing Restrictions Per Provider Order: Yes RLE Weight Bearing Per Provider Order: Weight bearing as tolerated Other Position/Activity Restrictions: transfers only on RLE  Pain:  No pain related this session. No s/s of orthostasis.    Therapy/Group: Individual Therapy  Mliss DELENA Milliner PT, DPT, CSRS 10/16/2024, 12:36 PM

## 2024-10-16 NOTE — Progress Notes (Signed)
   10/15/24 1949  Vitals  BP (!) 86/58  BP Location Right Arm  BP Method Manual  Patient Position (if appropriate) Lying  MEWS COLOR  MEWS Score Color Green  MEWS Score  MEWS Temp 0  MEWS Systolic 1  MEWS Pulse 0  MEWS RR 0  MEWS LOC 0  MEWS Score 1    Pt asymptomatic, Dr. Babs notified of BP, received instruction to push po fluids. Plan of care ongoing.

## 2024-10-16 NOTE — Plan of Care (Signed)
  Problem: RH SAFETY Goal: RH STG ADHERE TO SAFETY PRECAUTIONS W/ASSISTANCE/DEVICE Description: STG Adhere to Safety Precautions With Assistance/Device. 10/16/2024 0755 by Ann Axel HERO, RN Outcome: Progressing 10/16/2024 0754 by Ann Axel HERO, RN Outcome: Progressing   Problem: RH PAIN MANAGEMENT Goal: RH STG PAIN MANAGED AT OR BELOW PT'S PAIN GOAL Description: < 4 w/ prns 10/16/2024 0755 by Ann Axel HERO, RN Outcome: Progressing 10/16/2024 0754 by Ann Axel HERO, RN Outcome: Progressing   Problem: RH KNOWLEDGE DEFICIT SCI Goal: RH STG INCREASE KNOWLEDGE OF SELF CARE AFTER SCI Description: Manage increase knowledge of self care after SCI with supervision assistance from wife using educational materials provided Outcome: Progressing

## 2024-10-17 DIAGNOSIS — R339 Retention of urine, unspecified: Secondary | ICD-10-CM | POA: Diagnosis not present

## 2024-10-17 DIAGNOSIS — M5416 Radiculopathy, lumbar region: Secondary | ICD-10-CM | POA: Diagnosis not present

## 2024-10-17 DIAGNOSIS — J189 Pneumonia, unspecified organism: Secondary | ICD-10-CM | POA: Diagnosis not present

## 2024-10-17 DIAGNOSIS — T79A21D Traumatic compartment syndrome of right lower extremity, subsequent encounter: Secondary | ICD-10-CM | POA: Diagnosis not present

## 2024-10-17 NOTE — Plan of Care (Signed)
  Problem: SCI BOWEL ELIMINATION Goal: RH STG MANAGE BOWEL WITH ASSISTANCE Description: STG Manage Bowel with supervision Assistance. Outcome: Progressing   Problem: SCI BLADDER ELIMINATION Goal: RH STG MANAGE BLADDER WITH ASSISTANCE Description: STG Manage Bladder With supervision Assistance Outcome: Progressing   Problem: RH SKIN INTEGRITY Goal: RH STG SKIN FREE OF INFECTION/BREAKDOWN Description: Manage skin free of infection with supervision assistance Outcome: Progressing   Problem: RH SAFETY Goal: RH STG ADHERE TO SAFETY PRECAUTIONS W/ASSISTANCE/DEVICE Description: STG Adhere to Safety Precautions With Assistance/Device. Outcome: Progressing   Problem: RH PAIN MANAGEMENT Goal: RH STG PAIN MANAGED AT OR BELOW PT'S PAIN GOAL Description: < 4 w/ prns Outcome: Progressing   Problem: RH KNOWLEDGE DEFICIT SCI Goal: RH STG INCREASE KNOWLEDGE OF SELF CARE AFTER SCI Description: Manage increase knowledge of self care after SCI with supervision assistance from wife using educational materials provided Outcome: Progressing

## 2024-10-17 NOTE — Progress Notes (Signed)
 PROGRESS NOTE   Subjective/Complaints:  No new issues. Pt was sleeping soundly when I came in. Pain reasonable. Vac without problems  ROS: Patient denies fever, rash, sore throat, blurred vision, dizziness, nausea, vomiting, diarrhea, cough, shortness of breath or chest pain,  headache, or mood change.    Objective:   No results found.  Recent Labs    10/16/24 0516  WBC 8.3  HGB 8.4*  HCT 26.6*  PLT 202   Recent Labs    10/16/24 0516  NA 135  K 4.0  CL 103  CO2 20*  GLUCOSE 91  BUN 20  CREATININE 0.81  CALCIUM  8.1*    Intake/Output Summary (Last 24 hours) at 10/17/2024 9092 Last data filed at 10/17/2024 0600 Gross per 24 hour  Intake 440 ml  Output 1681 ml  Net -1241 ml     Wound 10/11/24 1630 Pressure Injury Heel Right Unstageable - Full thickness tissue loss in which the base of the injury is covered by slough (yellow, tan, gray, green or brown) and/or eschar (tan, brown or black) in the wound bed. (Active)    Physical Exam: Vital Signs Blood pressure 103/73, pulse 81, temperature 98.2 F (36.8 C), temperature source Oral, resp. rate 16, height 5' 9 (1.753 m), weight 86.2 kg, SpO2 92%.       Constitutional: No distress . Vital signs reviewed. HEENT: NCAT, EOMI, oral membranes moist Neck: supple Cardiovascular: RRR without murmur. No JVD    Respiratory/Chest: CTA Bilaterally without wheezes; crackles at right base. Normal effort    GI/Abdomen: BS +, non-tender, non-distended Ext: no clubbing, cyanosis, or edema Psych: flat but cooperative  Neurological: Ox3 VAC ongoing sanguinous drainage in cannister- no increased amount form yesterday. Good seal.     General: Skin is warm.     Multiple wounds as below                              Neurological:     Comments: Patient is alert.     Oriented x 3 and follows commands. CN exam non-focal. Fair insight and awareness. MMT: BUE  5/5. LLE limited by  pain but appears to be moving all muscle groups. RLE limited by wounds/pain. Sensory exam normal for light touch and pain except for right foot where there is diminished LT along dorsum and to a lesser extent plantar aspect of foot.  No limb ataxia or cerebellar signs. No abnormal tone appreciated.    Prior neuro assessment is c/w 10/17/2024 exam.   Musc: right heel cord tight      Assessment/Plan: 1. Functional deficits which require 3+ hours per day of interdisciplinary therapy in a comprehensive inpatient rehab setting. Physiatrist is providing close team supervision and 24 hour management of active medical problems listed below. Physiatrist and rehab team continue to assess barriers to discharge/monitor patient progress toward functional and medical goals  Care Tool:  Bathing    Body parts bathed by patient: Right arm, Left arm, Chest, Abdomen, Face   Body parts bathed by helper: Front perineal area, Buttocks, Right upper leg, Left lower leg, Right lower leg, Left upper leg  Bathing assist Assist Level: Total Assistance - Patient < 25%     Upper Body Dressing/Undressing Upper body dressing   What is the patient wearing?: Hospital gown only    Upper body assist Assist Level: Moderate Assistance - Patient 50 - 74%    Lower Body Dressing/Undressing Lower body dressing            Lower body assist Assist for lower body dressing: Total Assistance - Patient < 25%     Toileting Toileting    Toileting assist Assist for toileting: Total Assistance - Patient < 25%     Transfers Chair/bed transfer  Transfers assist  Chair/bed transfer activity did not occur: Safety/medical concerns  Chair/bed transfer assist level: 2 Helpers     Locomotion Ambulation   Ambulation assist   Ambulation activity did not occur: Safety/medical concerns          Walk 10 feet activity   Assist  Walk 10 feet activity did not occur: Safety/medical  concerns        Walk 50 feet activity   Assist Walk 50 feet with 2 turns activity did not occur: Safety/medical concerns         Walk 150 feet activity   Assist Walk 150 feet activity did not occur: Safety/medical concerns         Walk 10 feet on uneven surface  activity   Assist Walk 10 feet on uneven surfaces activity did not occur: Safety/medical concerns         Wheelchair     Assist Is the patient using a wheelchair?: Yes Type of Wheelchair: Manual Wheelchair activity did not occur: Safety/medical concerns         Wheelchair 50 feet with 2 turns activity    Assist    Wheelchair 50 feet with 2 turns activity did not occur: Safety/medical concerns       Wheelchair 150 feet activity     Assist  Wheelchair 150 feet activity did not occur: Safety/medical concerns       Blood pressure 103/73, pulse 81, temperature 98.2 F (36.8 C), temperature source Oral, resp. rate 16, height 5' 9 (1.753 m), weight 86.2 kg, SpO2 92%.  Medical Problem List and Plan: 1. Functional deficits secondary to PAD/compartment syndrome/right leg ischemia/status post thrombectomy.  Status post right leg anterior compartment fasciotomy 9/26 with multiple washouts as well as excisional debridement/wound VAC.  Patient is completing a course of doxycycline             -patient may not yet shower             -ELOS/Goals: 10-14 days, supervision goals  D/c 2-3 weeks  -Continue CIR therapies including PT, OT, and SLP  -ordered PRAFO to help stretch right heel cord  2.  Antithrombotics: -DVT/anticoagulation:  Pharmaceutical: Heparin  awaiting plan to possibly transition to Eliquis today 10/11/2024 10/14- con't Eliquis             -antiplatelet therapy: Aspirin  81 mg daily 3. Pain Management: Lyrica  150 mg 3 times daily, OxyContin  sustained-release 10 mg every 12 hours, oxycodone  10 mg every 4 hours as needed breakthrough pain Advil  400 mg every 4 hours as needed  10/14-  pain is doing better- overall   10/15- pain still skyrockets with therapy and had to stop today due to pain  10/17- refused to take anymore prn oxy- agreed to let me keep him on Oxycontin  qam and taper after a few days- due to constipation- said severe and doesn't  want anything by tylenol - need to wean Oxycontin  off Monday as well- if pain skyrockets, can restart if needed  10/18 pt doing fairly well, only on oxy cr 10mg  q12 4. Mood/Behavior/Sleep: Cymbalta  30 mg nightly, Celexa  20 mg daily             -antipsychotic agents: N/A 5. Neuropsych/cognition: This patient is capable of making decisions on his own behalf. 6. Skin/Wound Care: Routine skin checks             -vac management per WOC RN and Dr. Bianca changes             -local care and pressure relief as needed to multiple other wounds.             -prevalon boot/pressure relief, right heel wound 7. Fluids/Electrolytes/Nutrition: Routine and analysis with follow-up chemistries 8. lumbar radiculopathy/large disc herniation spinal stenosis.  Status post L4-5 transforaminal interbody fusion/posterior lateral fusion insertion interbody device L4-5 decompression 09/08/2024 per Dr. Beuford.  Back brace when out of bed    9.  Acute blood loss anemia.  Follow-up CBC  10/14- Pt has ~ 150-200cc out of VAC- not sure since when? Hb down 1/2 unit from 8.9 to 8.4- will recheck Thursday  10/15- Hb will check in AM  10/17- Hb 9.0 10.  AKI.  Resolved after IV hydration. 11.  Hyperlipidemia.  Crestor  12.  COPD/remote tobacco use.  Monitor oxygen  saturations every shift  10/15- was on O2 this AM- per nursing from overnight only- but Sats 89-91%- getting CXR and ABG to see where he is- and getting sepsis work up.   10/16- started on Unasyn and will start Mucinex for coarse cough-  13.  History of prostate cancer/urinary retention.  History of status post prostatectomy.  External catheter currently in place.  Continue Flomax  0.4 mg daily  10/14- is  voiding- but had urinary retention last time- will check a few bladder scans.   10/15 - pt needing in/out caths- had >1400cc in bladder- has cathing protocol now  10/16-10/17 has orders to bladder scan and in/out if volumes >350cc- requiring caths sometimes  10/18 may need to cath more frequently if he's having volumes >1000cc  10/19 cath volumes a little less yesterday--continue with current mgt 14.  History of detached retina left eye.  Follow-up outpatient ophthalmology. 15. Severe constipation  10/14- LBM at least 4-5 days ago per documentation- pt doesn't remember when went last- will get KUB- and order Sorbitol  30cc- and SSE if no results- having abd pain.   10/16- Pt had 1 liquid stool- but otherwise, no other stools in 6-7 days- last KUB shows on 10/14 was full of stool- will give Sorbitol  since likely has hard stool that liquid getting by- and SSE- if that doesn't work, will ask nursing to do Mg citrate.   10/17- had massive BM  per pt- per chart 1 large, 1 medium-   10/18 smear yesterday. Continue senna-s and increase miralax  to bid.  10/19 two small bm's today 16.  Hypokalemia  10/14- K+ 3.2- will replete KCL 40 Meq x1.  17. Low grade temp in setting of delirium  10/15- will get sepsis work up, ammonia level and ABG  10/17- has HCAP- Per #21 18. Cirrhosis on last Abd imaging  10/15- will get Ammonia as well esp with new jerking/tremors.   10/16- is actually shivering this AM, but isn't cold- Ammonia 16- not the cause of Sx's.  19. Infrarenal aortic aneurysm with mural thrombus- 6.8  cm on last imaging   20. Orthostatic hypotension  10/16- On Flomax , however No BP meds that I can reduce- will check with therapy- if dropping more, will add Midodrine- ordering ACE wrap on LLE  -addendum- adding Midodrine 5 mg TID starting at 630 am  10/19 bp's a little more robust over weekend thus far. No changes today 21. HCAP  10/16- on Unasyn-  will con't for 5-6 days- monitor WBC- down to 12.7  from 13.2 last evening. Shivering could be due to breaking a temp>>??  10/17- no tremors/shivering- will recheck CBC in AM- and make sure on right ABX-  and CMP due to LFTS/renal check on IV ABX- Added Albuterol  scheduled QID due to wheezing and bad COPD  10/18 wbc's down to 8.3, afebrile   -continue unasyn, nebs   -encouraged IS, OOB   -F/u labs Monday    -see #12     LOS: 6 days A FACE TO FACE EVALUATION WAS PERFORMED  Arthea ONEIDA Gunther 10/17/2024, 9:07 AM

## 2024-10-17 NOTE — Plan of Care (Signed)
  Problem: RH SAFETY Goal: RH STG ADHERE TO SAFETY PRECAUTIONS W/ASSISTANCE/DEVICE Description: STG Adhere to Safety Precautions With Assistance/Device. Outcome: Progressing   Problem: RH PAIN MANAGEMENT Goal: RH STG PAIN MANAGED AT OR BELOW PT'S PAIN GOAL Description: < 4 w/ prns Outcome: Progressing   Problem: SCI BLADDER ELIMINATION Goal: RH STG MANAGE BLADDER WITH ASSISTANCE Description: STG Manage Bladder With supervision Assistance Outcome: Not Progressing

## 2024-10-18 DIAGNOSIS — T79A21A Traumatic compartment syndrome of right lower extremity, initial encounter: Secondary | ICD-10-CM

## 2024-10-18 LAB — BASIC METABOLIC PANEL WITH GFR
Anion gap: 9 (ref 5–15)
BUN: 17 mg/dL (ref 8–23)
CO2: 21 mmol/L — ABNORMAL LOW (ref 22–32)
Calcium: 8.3 mg/dL — ABNORMAL LOW (ref 8.9–10.3)
Chloride: 105 mmol/L (ref 98–111)
Creatinine, Ser: 0.86 mg/dL (ref 0.61–1.24)
GFR, Estimated: >60 mL/min (ref >60)
Glucose, Bld: 94 mg/dL (ref 70–99)
Potassium: 3.6 mmol/L (ref 3.5–5.1)
Sodium: 135 mmol/L (ref 135–145)

## 2024-10-18 LAB — CBC WITH DIFFERENTIAL/PLATELET
Abs Immature Granulocytes: 0.36 K/uL — ABNORMAL HIGH (ref 0.00–0.07)
Basophils Absolute: 0.1 K/uL (ref 0.0–0.1)
Basophils Relative: 1 %
Eosinophils Absolute: 0.4 K/uL (ref 0.0–0.5)
Eosinophils Relative: 5 %
HCT: 29.9 % — ABNORMAL LOW (ref 39.0–52.0)
Hemoglobin: 9.4 g/dL — ABNORMAL LOW (ref 13.0–17.0)
Immature Granulocytes: 4 %
Lymphocytes Relative: 16 %
Lymphs Abs: 1.3 K/uL (ref 0.7–4.0)
MCH: 29.7 pg (ref 26.0–34.0)
MCHC: 31.4 g/dL (ref 30.0–36.0)
MCV: 94.6 fL (ref 80.0–100.0)
Monocytes Absolute: 0.4 K/uL (ref 0.1–1.0)
Monocytes Relative: 5 %
Neutro Abs: 5.7 K/uL (ref 1.7–7.7)
Neutrophils Relative %: 69 %
Platelets: 209 K/uL (ref 150–400)
RBC: 3.16 MIL/uL — ABNORMAL LOW (ref 4.22–5.81)
RDW: 18.6 % — ABNORMAL HIGH (ref 11.5–15.5)
WBC: 8.2 K/uL (ref 4.0–10.5)
nRBC: 0 % (ref 0.0–0.2)

## 2024-10-18 LAB — HEPATIC FUNCTION PANEL
ALT: 47 U/L — ABNORMAL HIGH (ref 0–44)
AST: 37 U/L (ref 15–41)
Albumin: 2.3 g/dL — ABNORMAL LOW (ref 3.5–5.0)
Alkaline Phosphatase: 75 U/L (ref 38–126)
Bilirubin, Direct: <0.1 mg/dL (ref 0.0–0.2)
Total Bilirubin: 0.7 mg/dL (ref 0.0–1.2)
Total Protein: 6.3 g/dL — ABNORMAL LOW (ref 6.5–8.1)

## 2024-10-18 MED ORDER — MIDODRINE HCL 5 MG PO TABS
5.0000 mg | ORAL_TABLET | Freq: Three times a day (TID) | ORAL | Status: DC
  Administered 2024-10-18 – 2024-10-28 (×31): 5 mg via ORAL
  Filled 2024-10-18 (×30): qty 1

## 2024-10-18 NOTE — Progress Notes (Signed)
 Occupational Therapy Session Note  Patient Details  Name: David King MRN: [id number] Date of Birth: 24-Mar-1956  Today's Date: 10/18/2024 OT Individual Time: 0700-0815 OT Individual Time Calculation (min): 75 min    Short Term Goals: Week 1:  OT Short Term Goal 1 (Week 1): Patient to complete LB dressing with Mod A OT Short Term Goal 2 (Week 1): Patient to perform toileting with Mod A OT Short Term Goal 3 (Week 1): Patient to perform toilet transfer with Min assist OT Short Term Goal 4 (Week 1): Pt will don/doff TLSO brace with Mod A  Skilled Therapeutic Interventions/Progress Updates:    Pt eating breakfast upon arrival (in bed.) Skilled OT intervention with focus on bed mobility, toileting at bed level, sitting balance EOB, sit<>stand in [place], activity tolerance, and safety awareness to increase independence with BADLs. Pt requested to use bed pan. Rolling R/L in bed with CGA using bed rails. Pt required tot A for hygiene after BM. Tot A for donning pants at bed level but pt able to raise BLE in bed to facilitate OTA threading pants. Tot A for pulling pants over hips. Supine>sit EOB with CGA. Pt reported minimum dizziness which resolved quickly. Tot A for donning TLSO while seated EOB. Sit<>stand in [place] with supervision. Pt transferred to TIS w/c. BP seated in w/c 93/63 HR 88. Pt remained in w/c with all needs within reach. Wound Vac plugged in and running.   Therapy Documentation Precautions:  Precautions Precautions: Fall, Back Precaution Booklet Issued: Yes (comment) Recall of Precautions/Restrictions: Impaired Precaution/Restrictions Comments: watch BP (orthostatic); RLE foot drop, wound VAC Required Braces or Orthoses: Spinal Brace Spinal Brace: Thoracolumbosacral orthotic, Applied in sitting position Restrictions Weight Bearing Restrictions Per Provider Order: Yes RLE Weight Bearing Per Provider Order: Weight bearing as tolerated Other Position/Activity Restrictions:  transfers only on RLE Pain: Pt c/o 5/10 RLE pain (but manageable); scheduled for pain meds at end of session and repositioned  Therapy/Group: Individual Therapy  [name] 10/18/2024, 9:02 AM

## 2024-10-18 NOTE — Progress Notes (Signed)
 Patient concerned negative pressure wound not draining. This RN checked seal and wound vac is operating at . Dr. Harden made aware of patient concern;MD states no drainage is appropriate. Relayed information to patient. No further questions.

## 2024-10-18 NOTE — Progress Notes (Signed)
 Speech Language Pathology Daily Session Note  Patient Details  Name: David King MRN: [id number] Date of Birth: [birth year]  Today's Date: 10/18/2024 SLP Individual Time: 1300-1400 SLP Individual Time Calculation (min): 60 min  Short Term Goals: Week 1: SLP Short Term Goal 1 (Week 1): Pt will recall recent/relevant information w/ supervision SLP Short Term Goal 2 (Week 1): Pt will solve functional problems w/ superivion SLP Short Term Goal 3 (Week 1): Pt will participate in dynamic assessment of all cognitive domains to determine additional tx goals as needed  Skilled Therapeutic Interventions: SLP conducted skilled therapy session targeting cognition goals. SLP reviewed memory log strategy with patient and provided assist to write log of therapy sessions and tasks completed during each. Patient recalled broad details, such as session duration and general locations with modI, but required up to minA for small details. Patient required use of external handout aid to recall WRAP memory strategies and min assist to generate functional examples for use of each strategy. SLP then transitioned to facilitation of mildly complex problem solving task where patient solved functional math-based scenarios given supervision-minA. In final minutes of session, patient challenged to recall pictured information. Patient utilized 60 second intervals to memorize pictured information, then recalled information with 100% accuracy after stimulus removal. Patient was left in room with call bell in reach and alarm set. SLP will continue to target goals per plan of care.        Pain  None  Therapy/Group: Individual Therapy  Bijon Mineer, M.A., CCC-SLP  Byanka Landrus A Omega Slager 10/18/2024, 2:29 PM

## 2024-10-18 NOTE — Plan of Care (Signed)
  Problem: SCI BOWEL ELIMINATION Goal: RH STG MANAGE BOWEL WITH ASSISTANCE Description: STG Manage Bowel with supervision Assistance. Outcome: Progressing   Problem: SCI BLADDER ELIMINATION Goal: RH STG MANAGE BLADDER WITH ASSISTANCE Description: STG Manage Bladder With supervision Assistance Outcome: Progressing   Problem: RH SKIN INTEGRITY Goal: RH STG SKIN FREE OF INFECTION/BREAKDOWN Description: Manage skin free of infection with supervision assistance Outcome: Progressing   Problem: RH SAFETY Goal: RH STG ADHERE TO SAFETY PRECAUTIONS W/ASSISTANCE/DEVICE Description: STG Adhere to Safety Precautions With Assistance/Device. Outcome: Progressing   Problem: RH PAIN MANAGEMENT Goal: RH STG PAIN MANAGED AT OR BELOW PT'S PAIN GOAL Description: < 4 w/ prns Outcome: Progressing   Problem: RH KNOWLEDGE DEFICIT SCI Goal: RH STG INCREASE KNOWLEDGE OF SELF CARE AFTER SCI Description: Manage increase knowledge of self care after SCI with supervision assistance from wife using educational materials provided Outcome: Progressing

## 2024-10-18 NOTE — Progress Notes (Signed)
 Occupational Therapy Session Note  Patient Details  Name: David King MRN: [id number] Date of Birth: April 26, 1956  Today's Date: 10/18/2024 OT Individual Time: 1015-1105 OT Individual Time Calculation (min): 50 min    Short Term Goals: Week 1:  OT Short Term Goal 1 (Week 1): Patient to complete LB dressing with Mod A OT Short Term Goal 1 - Progress (Week 1): Progressing toward goal OT Short Term Goal 2 (Week 1): Patient to perform toileting with Mod A OT Short Term Goal 2 - Progress (Week 1): Progressing toward goal OT Short Term Goal 3 (Week 1): Patient to perform toilet transfer with Min assist OT Short Term Goal 3 - Progress (Week 1): Progressing toward goal OT Short Term Goal 4 (Week 1): Pt will don/doff TLSO brace with Mod A OT Short Term Goal 4 - Progress (Week 1): Progressing toward goal  Skilled Therapeutic Interventions/Progress Updates:      Therapy Documentation Precautions:  Precautions Precautions: Fall, Back Precaution Booklet Issued: Yes (comment) Recall of Precautions/Restrictions: Impaired Precaution/Restrictions Comments: watch BP (orthostatic); RLE foot drop, wound VAC Required Braces or Orthoses: Spinal Brace Spinal Brace: Thoracolumbosacral orthotic, Applied in sitting position Restrictions Weight Bearing Restrictions Per Provider Order: Yes RLE Weight Bearing Per Provider Order: Weight bearing as tolerated Other Position/Activity Restrictions: transfers only on RLE General: Pt reclined in TIS upon OT arrival, agreeable to OT. Wound vac and IV in place  Pain:  7/10 pain reported in RLE, activity, intermittent rest breaks, distractions provided for pain management, pt reports tolerable to proceed.   Exercises: Pt completed 10 minutes of arm bike, 5 min forward, 5 min backward in order to increase BUE/BLEstrength and endurance, circulation and upright tolerance in preparation for increased independence in ADLs. Pt with occasional tilted rest breaks in order  to manage BP, on level 3 resistance.  At end of session, pt wanting to get back into bed, Mod A to stand within stedy, +2 to manage equipment. Process moving quickly d/t low BP, pt reported slight dizziness but overall okay once seated EOB. Pt supine in bed with bed alarm activated, 2 bed rails up, call light within reach and 4Ps assessed.   Therapy/Group: Individual Therapy  Camie Hoe, OTD, OTR/L 10/18/2024, 4:07 PM

## 2024-10-18 NOTE — Progress Notes (Signed)
 Occupational Therapy Weekly Progress Note  Patient Details  Name: David King MRN: [id number] Date of Birth: 1956/07/06  Beginning of progress report period: October 12, 2024 End of progress report period: October 18, 2024  Patient has met 0 of 4 short term goals.  Pt progress has been slow, especially at beginning of rehab admission, but pt progress has been steadily improving over the past couple of days. Pt with considerable pain at beginning of stay 2/2 constipation. Pain has considerably improved and pt's ability to actively participate has also improved. Pt requires tot A for LB dressing at bed level (wound vac x 2 on RLE). Supine>sit EOB and sitting balance with CGA/Supervision. Sit<>stand in [place] with supervsion. Pt tolerating therapy well and active participation improving daily. Pt's wife has not been present for therapy.   Patient continues to demonstrate the following deficits: {impairments:3041632} and therefore will continue to benefit from skilled OT intervention to enhance overall performance with {ADL/iADL:3041649}.  Patient {LTG progression:3041653}.  {plan of rjmz:6958345}  OT Short Term Goals Week 1:  OT Short Term Goal 1 (Week 1): Patient to complete LB dressing with Mod A OT Short Term Goal 1 - Progress (Week 1): Progressing toward goal OT Short Term Goal 2 (Week 1): Patient to perform toileting with Mod A OT Short Term Goal 2 - Progress (Week 1): Progressing toward goal OT Short Term Goal 3 (Week 1): Patient to perform toilet transfer with Min assist OT Short Term Goal 3 - Progress (Week 1): Progressing toward goal OT Short Term Goal 4 (Week 1): Pt will don/doff TLSO brace with Mod A OT Short Term Goal 4 - Progress (Week 1): Progressing toward goal Week 2:  OT Short Term Goal 1 (Week 2): Patient to complete LB dressing with Mod A OT Short Term Goal 2 (Week 2): Patient to perform toileting with Mod A OT Short Term Goal 3 (Week 2): Patient to perform toilet transfer  with Min assist OT Short Term Goal 4 (Week 2): Pt will don/doff TLSO brace with Mod A   [name] 10/18/2024, 9:11 AM

## 2024-10-18 NOTE — Progress Notes (Signed)
 PROGRESS NOTE   Subjective/Complaints:  Pt reports doing great this AM- doing well off pain meds- wants me to stop the day long acting Oxycontin .  Thinks can do it with tylenol .    ROS:   Pt denies SOB, abd pain, CP, N/V/C/D, and vision changes   Objective:   No results found.  Recent Labs    10/16/24 0516 10/18/24 0705  WBC 8.3 8.2  HGB 8.4* 9.4*  HCT 26.6* 29.9*  PLT 202 209   Recent Labs    10/16/24 0516  NA 135  K 4.0  CL 103  CO2 20*  GLUCOSE 91  BUN 20  CREATININE 0.81  CALCIUM  8.1*    Intake/Output Summary (Last 24 hours) at 10/18/2024 0751 Last data filed at 10/18/2024 0200 Gross per 24 hour  Intake 696 ml  Output 1100 ml  Net -404 ml     Wound 10/11/24 1630 Pressure Injury Heel Right Unstageable - Full thickness tissue loss in which the base of the injury is covered by slough (yellow, tan, gray, green or brown) and/or eschar (tan, brown or black) in the wound bed. (Active)    Physical Exam: Vital Signs Blood pressure 111/79, pulse 80, temperature 98.2 F (36.8 C), resp. rate 18, height 5' 9 (1.753 m), weight 86.2 kg, SpO2 97%.       General: awake, alert, appropriate,  sitting up in bed eating breakfast- just started; NAD HENT: conjugate gaze; oropharynx moist CV: regular rate and rhythm; no JVD Pulmonary: CTA B/L; no W/R/R- good air movement GI: soft, NT, ND, (+)BS- slightly hyperactive from eating Psychiatric: appropriate- much brighter (still a little flat) Neurological: Ox3 VAC ongoing sanguinous drainage in cannister- no increased amount form yesterday. Good seal.  Still at 350cc.     General: Skin is warm.     Multiple wounds as below                              Neurological:     Comments: Patient is alert.     Oriented x 3 and follows commands. CN exam non-focal. Fair insight and awareness. MMT: BUE 5/5. LLE limited by  pain but appears to be moving all  muscle groups. RLE limited by wounds/pain. Sensory exam normal for light touch and pain except for right foot where there is diminished LT along dorsum and to a lesser extent plantar aspect of foot.  No limb ataxia or cerebellar signs. No abnormal tone appreciated.    Prior neuro assessment is c/w 10/18/2024 exam.   Musc: right heel cord tight      Assessment/Plan: 1. Functional deficits which require 3+ hours per day of interdisciplinary therapy in a comprehensive inpatient rehab setting. Physiatrist is providing close team supervision and 24 hour management of active medical problems listed below. Physiatrist and rehab team continue to assess barriers to discharge/monitor patient progress toward functional and medical goals  Care Tool:  Bathing    Body parts bathed by patient: Right arm, Left arm, Chest, Abdomen, Face   Body parts bathed by helper: Front perineal area, Buttocks, Right upper leg, Left lower leg, Right lower leg,  Left upper leg     Bathing assist Assist Level: Total Assistance - Patient < 25%     Upper Body Dressing/Undressing Upper body dressing   What is the patient wearing?: Hospital gown only    Upper body assist Assist Level: Moderate Assistance - Patient 50 - 74%    Lower Body Dressing/Undressing Lower body dressing            Lower body assist Assist for lower body dressing: Total Assistance - Patient < 25%     Toileting Toileting    Toileting assist Assist for toileting: Total Assistance - Patient < 25%     Transfers Chair/bed transfer  Transfers assist  Chair/bed transfer activity did not occur: Safety/medical concerns  Chair/bed transfer assist level: 2 Helpers     Locomotion Ambulation   Ambulation assist   Ambulation activity did not occur: Safety/medical concerns          Walk 10 feet activity   Assist  Walk 10 feet activity did not occur: Safety/medical concerns        Walk 50 feet activity   Assist Walk 50  feet with 2 turns activity did not occur: Safety/medical concerns         Walk 150 feet activity   Assist Walk 150 feet activity did not occur: Safety/medical concerns         Walk 10 feet on uneven surface  activity   Assist Walk 10 feet on uneven surfaces activity did not occur: Safety/medical concerns         Wheelchair     Assist Is the patient using a wheelchair?: Yes Type of Wheelchair: Manual Wheelchair activity did not occur: Safety/medical concerns         Wheelchair 50 feet with 2 turns activity    Assist    Wheelchair 50 feet with 2 turns activity did not occur: Safety/medical concerns       Wheelchair 150 feet activity     Assist  Wheelchair 150 feet activity did not occur: Safety/medical concerns       Blood pressure 111/79, pulse 80, temperature 98.2 F (36.8 C), resp. rate 18, height 5' 9 (1.753 m), weight 86.2 kg, SpO2 97%.  Medical Problem List and Plan: 1. Functional deficits secondary to PAD/compartment syndrome/right leg ischemia/status post thrombectomy.  Status post right leg anterior compartment fasciotomy 9/26 with multiple washouts as well as excisional debridement/wound VAC.  Patient is completing a course of doxycycline             -patient may not yet shower             -ELOS/Goals: 10-14 days, supervision goals  D/c 2-3 weeks  Con't CIR PT And OT -ordered PRAFO to help stretch right heel cord  -Pt has VAC- need to see who's changing it- and the plan- will d/w PA/NP.  2.  Antithrombotics: -DVT/anticoagulation:  Pharmaceutical: Heparin  awaiting plan to possibly transition to Eliquis today 10/11/2024 10/14- con't Eliquis             -antiplatelet therapy: Aspirin  81 mg daily 3. Pain Management: Lyrica  150 mg 3 times daily, OxyContin  sustained-release 10 mg every 12 hours, oxycodone  10 mg every 4 hours as needed breakthrough pain Advil  400 mg every 4 hours as needed  10/14- pain is doing better- overall   10/15-  pain still skyrockets with therapy and had to stop today due to pain  10/17- refused to take anymore prn oxy- agreed to let me keep him  on Oxycontin  qam and taper after a few days- due to constipation- said severe and doesn't want anything by tylenol - need to wean Oxycontin  off Monday as well- if pain skyrockets, can restart if needed  10/18 pt doing fairly well, only on oxy cr 10mg  q12  10/20- will stop oxycontin  per pt request- refuses everything but tylenol  - if he decides OFF oxycontin  that wants it back, can have Oxycontin  back 4. Mood/Behavior/Sleep: Cymbalta  30 mg nightly, Celexa  20 mg daily             -antipsychotic agents: N/A 5. Neuropsych/cognition: This patient is capable of making decisions on his own behalf. 6. Skin/Wound Care: Routine skin checks             -vac management per WOC RN and Dr. Bianca changes             -local care and pressure relief as needed to multiple other wounds.             -prevalon boot/pressure relief, right heel wound 7. Fluids/Electrolytes/Nutrition: Routine and analysis with follow-up chemistries 8. lumbar radiculopathy/large disc herniation spinal stenosis.  Status post L4-5 transforaminal interbody fusion/posterior lateral fusion insertion interbody device L4-5 decompression 09/08/2024 per Dr. Beuford.  Back brace when out of bed    9.  Acute blood loss anemia.  Follow-up CBC  10/14- Pt has ~ 150-200cc out of VAC- not sure since when? Hb down 1/2 unit from 8.9 to 8.4- will recheck Thursday  10/15- Hb will check in AM  10/17- Hb 9.0 10.  AKI.  Resolved after IV hydration. 11.  Hyperlipidemia.  Crestor  12.  COPD/remote tobacco use.  Monitor oxygen  saturations every shift  10/15- was on O2 this AM- per nursing from overnight only- but Sats 89-91%- getting CXR and ABG to see where he is- and getting sepsis work up.   10/16- started on Unasyn and will start Mucinex for coarse cough-  13.  History of prostate cancer/urinary retention.  History of  status post prostatectomy.  External catheter currently in place.  Continue Flomax  0.4 mg daily  10/14- is voiding- but had urinary retention last time- will check a few bladder scans.   10/15 - pt needing in/out caths- had >1400cc in bladder- has cathing protocol now  10/16-10/17 has orders to bladder scan and in/out if volumes >350cc- requiring caths sometimes  10/18 may need to cath more frequently if he's having volumes >1000cc  10/19 cath volumes a little less yesterday--continue with current mgt  10/20- pt's Caths are 280-60cc- doing better 14.  History of detached retina left eye.  Follow-up outpatient ophthalmology. 15. Severe constipation  10/14- LBM at least 4-5 days ago per documentation- pt doesn't remember when went last- will get KUB- and order Sorbitol  30cc- and SSE if no results- having abd pain.   10/16- Pt had 1 liquid stool- but otherwise, no other stools in 6-7 days- last KUB shows on 10/14 was full of stool- will give Sorbitol  since likely has hard stool that liquid getting by- and SSE- if that doesn't work, will ask nursing to do Mg citrate.   10/17- had massive BM  per pt- per chart 1 large, 1 medium-   10/18 smear yesterday. Continue senna-s and increase miralax  to bid.  10/19 two small bm's today  10/20- LBM this AM- was medium type 4 16.  Hypokalemia  10/14- K+ 3.2- will replete KCL 40 Meq x1.  17. Low grade temp in setting of delirium  10/15-  will get sepsis work up, ammonia level and ABG  10/17- has HCAP- Per #21 18. Cirrhosis on last Abd imaging  10/15- will get Ammonia as well esp with new jerking/tremors.   10/16- is actually shivering this AM, but isn't cold- Ammonia 16- not the cause of Sx's.   10/20- ALT is 47- slightly up but AST down to 37- can con't tylenol  as ordered 19. Infrarenal aortic aneurysm with mural thrombus- 6.8 cm on last imaging   20. Orthostatic hypotension  10/16- On Flomax , however No BP meds that I can reduce- will check with therapy-  if dropping more, will add Midodrine- ordering ACE wrap on LLE  -addendum- adding Midodrine 5 mg TID starting at 630 am  10/19 bp's a little more robust over weekend thus far. No changes today  10/20- BP doing better on midodrine 21. HCAP  10/16- on Unasyn-  will con't for 5-6 days- monitor WBC- down to 12.7 from 13.2 last evening. Shivering could be due to breaking a temp>>??  10/17- no tremors/shivering- will recheck CBC in AM- and make sure on right ABX-  and CMP due to LFTS/renal check on IV ABX- Added Albuterol  scheduled QID due to wheezing and bad COPD  10/18 wbc's down to 8.3, afebrile   -continue unasyn, nebs   -encouraged IS, OOB   -F/u labs Monday    10/20- WBC down to 8.2- will finish Unasyn   I spent a total of 37   minutes on total care today- >50% coordination of care- due to   D/w pt about his pain, tylenol  and LFTs- and d/w PA about VAC    LOS: 7 days A FACE TO FACE EVALUATION WAS PERFORMED  David King 10/18/2024, 7:51 AM

## 2024-10-18 NOTE — Progress Notes (Signed)
 Physical Therapy Session Note  Patient Details  Name: David King MRN: [id number] Date of Birth: Sep 06, 1956  Today's Date: 10/18/2024 PT Individual Time: 0900-1000 PT Individual Time Calculation (min): 60 min   Short Term Goals: Week 1:  PT Short Term Goal 1 (Week 1): Pt will tolerate sitting upright >2 hours PT Short Term Goal 2 (Week 1): Pt will perform stedy transfers with mod a or better PT Short Term Goal 3 (Week 1): Pt will consistently perform supine<>sit with CGA  Skilled Therapeutic Interventions/Progress Updates:    Pt seated in w/c on arrival and agreeable to therapy. Pt with reports of R heel pain, adjusted positioning and provided stretching during session. Session focused on standing for upright tolerance. Pt performed Sit to stand in stedy x 3 with min a overall. Required extended rest breaks using tilt feature d/t immediate onset of dizziness once in standing. Pt also reports rubber band feeling in his R side consistent with tightness in knee and hip flexors. Therapist provided x 3 bouts of hamstring and gastroc stretch x ~45 sec each, BLE. Pt remained in TIS at end of session with needs in reach.   Therapy Documentation Precautions:  Precautions Precautions: Fall, Back Precaution Booklet Issued: Yes (comment) Recall of Precautions/Restrictions: Impaired Precaution/Restrictions Comments: watch BP (orthostatic); RLE foot drop, wound VAC Required Braces or Orthoses: Spinal Brace Spinal Brace: Thoracolumbosacral orthotic, Applied in sitting position Restrictions Weight Bearing Restrictions Per Provider Order: Yes RLE Weight Bearing Per Provider Order: Weight bearing as tolerated Other Position/Activity Restrictions: transfers only on RLE General:        Therapy/Group: Individual Therapy  David King 10/18/2024, 3:59 PM

## 2024-10-19 DIAGNOSIS — R339 Retention of urine, unspecified: Secondary | ICD-10-CM | POA: Diagnosis not present

## 2024-10-19 DIAGNOSIS — J189 Pneumonia, unspecified organism: Secondary | ICD-10-CM | POA: Diagnosis not present

## 2024-10-19 DIAGNOSIS — T79A21D Traumatic compartment syndrome of right lower extremity, subsequent encounter: Secondary | ICD-10-CM | POA: Diagnosis not present

## 2024-10-19 DIAGNOSIS — M5416 Radiculopathy, lumbar region: Secondary | ICD-10-CM | POA: Diagnosis not present

## 2024-10-19 MED ORDER — ACETAMINOPHEN 325 MG PO TABS
650.0000 mg | ORAL_TABLET | ORAL | Status: DC | PRN
  Administered 2024-10-19 – 2024-11-05 (×48): 650 mg via ORAL
  Filled 2024-10-19 (×49): qty 2

## 2024-10-19 NOTE — Plan of Care (Signed)
  Problem: SCI BOWEL ELIMINATION Goal: RH STG MANAGE BOWEL WITH ASSISTANCE Description: STG Manage Bowel with supervision Assistance. Outcome: Progressing   Problem: SCI BLADDER ELIMINATION Goal: RH STG MANAGE BLADDER WITH ASSISTANCE Description: STG Manage Bladder With supervision Assistance Outcome: Progressing   Problem: RH SKIN INTEGRITY Goal: RH STG SKIN FREE OF INFECTION/BREAKDOWN Description: Manage skin free of infection with supervision assistance Outcome: Progressing   Problem: RH SAFETY Goal: RH STG ADHERE TO SAFETY PRECAUTIONS W/ASSISTANCE/DEVICE Description: STG Adhere to Safety Precautions With Assistance/Device. Outcome: Progressing   Problem: RH PAIN MANAGEMENT Goal: RH STG PAIN MANAGED AT OR BELOW PT'S PAIN GOAL Description: < 4 w/ prns Outcome: Progressing   Problem: RH KNOWLEDGE DEFICIT SCI Goal: RH STG INCREASE KNOWLEDGE OF SELF CARE AFTER SCI Description: Manage increase knowledge of self care after SCI with supervision assistance from wife using educational materials provided Outcome: Progressing

## 2024-10-19 NOTE — Progress Notes (Signed)
 Physical Therapy Weekly Progress Note  Patient Details  Name: David King MRN: [id number] Date of Birth: 1956-08-04  Beginning of progress report period: October 12, 2024 End of progress report period: October 19, 2024  Today's Date: 10/19/2024 PT Individual Time: 0800-0900, 1130-1200 PT Individual Time Calculation (min): 60 min, 30 min   Patient has met 3 of 3 short term goals.  Pt has made fantastic progress in the past few days. While still limited by fatigue and orthostatic hypotension, he is able to perform bed mobility with supervision and stand in stedy CGA. Pt also ambulated for the first time this admission with +2 for w/c follow and RW, see details below.   Patient continues to demonstrate the following deficits muscle weakness and muscle joint tightness, decreased cardiorespiratoy endurance, and decreased standing balance, decreased postural control, decreased balance strategies, and difficulty maintaining precautions and therefore will continue to benefit from skilled PT intervention to increase functional independence with mobility.  Patient progressing toward long term goals..  Continue plan of care.  PT Short Term Goals Week 1:  PT Short Term Goal 1 (Week 1): Pt will tolerate sitting upright >2 hours PT Short Term Goal 1 - Progress (Week 1): Met PT Short Term Goal 2 (Week 1): Pt will perform stedy transfers with mod a or better PT Short Term Goal 2 - Progress (Week 1): Met PT Short Term Goal 3 (Week 1): Pt will consistently perform supine<>sit with CGA PT Short Term Goal 3 - Progress (Week 1): Met Week 2:  PT Short Term Goal 1 (Week 2): pt will tolerate standing >60 sec without orthostatic hypotension PT Short Term Goal 2 (Week 2): Pt will ambulate 30 ft or >with LRAD PT Short Term Goal 3 (Week 2): Pt will initiate transfer training with RW  Skilled Therapeutic Interventions/Progress Updates:  Session 1: pt received in bed and agreeable to therapy. Pt continues to have  unrated RLE pain, therapy to tolerance. Donned ted hose tot a. Pt requesting to use bed pan. Assisted on and off bed pan. Continent small BM. Donned pants with assist to thread, able to bridge and pull over hips without assist.  Pt then performed supine>sit with supervision. Donned shoes with tot A. Monitored orthostatic hypotension based on symptoms, extra time to adjust to upright, but otherwise asymptomatic. Stedy transfer to [facility] with CGA. Pt with continent B+B void, assisted with pericare and clothing management to minimize time in standing. Pt then set up in TIS at end of session and was left with all needs in reach and alarm active.   Session 2: Pt recd in TIS, continued RLE heel pain (unrated), therapy to tolerance. Pt transported to therapy gym for time management and energy conservation. Session focused on gait training. 1st trial with hall rail and HHA, w/c follow x 3 steps. Following trials with RW and min-mod a d/t RLE tightness preventing full ROM and fatigue. Pt ambulated x 5 steps, x 9 steps, and x 15 steps, for a totals of 30 ft across all trials. Pt returned to room and remained in w/c, was left with all needs in reach and alarm active.   Therapy Documentation Precautions:  Precautions Precautions: Fall, Back Precaution Booklet Issued: Yes (comment) Recall of Precautions/Restrictions: Impaired Precaution/Restrictions Comments: watch BP (orthostatic); RLE foot drop, wound VAC Required Braces or Orthoses: Spinal Brace Spinal Brace: Thoracolumbosacral orthotic, Applied in sitting position Restrictions Weight Bearing Restrictions Per Provider Order: Yes RLE Weight Bearing Per Provider Order: Weight bearing as tolerated Other  Position/Activity Restrictions: transfers only on RLE General:     Therapy/Group: Individual Therapy  David King 10/19/2024, 9:14 AM

## 2024-10-19 NOTE — Progress Notes (Signed)
 Speech Language Pathology Daily Session Note  Patient Details  Name: David King MRN: [id number] Date of Birth: 1956-05-21  Today's Date: 10/19/2024 SLP Individual Time: 0904-1000 SLP Individual Time Calculation (min): 56 min  Short Term Goals: Week 1: SLP Short Term Goal 1 (Week 1): Pt will recall recent/relevant information w/ supervision SLP Short Term Goal 2 (Week 1): Pt will solve functional problems w/ superivion SLP Short Term Goal 3 (Week 1): Pt will participate in dynamic assessment of all cognitive domains to determine additional tx goals as needed  Skilled Therapeutic Interventions:  Patient was seen in am to address cognitive re- training. Direct handoff from PT with pt alert and seated upright in WC upon SLP arrival. SLP engaged pt in a life review in order to facilitate rapport building and to assess sequencing and long term memory. Pt discussed career and life post retirement. Session transitioned to discussion about recent medical hx and frequent surgeries. Pt demonstrating adequate recall of medical hx including most recent timeline with lumbar fusion complicated by anaphylactic shock. He was further challenged in recall of PT session where he recalled and identified getting dressed and transfers. Pt continues to endorse changes in cognition including feeling foggy. He recalled 3 of 4 components of WRAP memory strategies mod I. SLP reviewed remaining strategy. Memory notebook updated to reflect therapies this am. Problem solving addressed with pt indep recalling spinal precautions and recommendations for brace. Pt able to utilize therapy schedule to identify remaining therapies along with recalling team conference and ultimately finding out discharge this date. Discussed initiating cognitive stimulation tasks in order to address brain health. At conclusion of session, pt was left upright in [facility] with call button within reach. SLP to continue POC.   Pain Pain Assessment Pain  Scale: 0-10 Pain Score: 8  Pain Type: Acute pain Pain Location: Foot Pain Orientation: Right Pain Intervention(s): Medication (See eMAR)  Therapy/Group: Individual Therapy  [name] 10/19/2024, 9:44 AM

## 2024-10-19 NOTE — Progress Notes (Signed)
 Patient ID: David King, male   DOB: 08/16/1956, 68 y.o.   MRN: [id number]  SW met with pt and pt wife to provide updates from team conference, and d/c dare 11/11. Pt was sleep. No HHA preference. SW shared plan for pt to discharge with wound vac unless vascular decides on other options.   SW sent out HHPT/OT/SLP/SN (wound vac)/aide to various HHAs and waiting on follow-up.   Graeme Jude, MSW, LCSW Office: [phone] Cell: [phone] Fax: [phone]

## 2024-10-19 NOTE — Progress Notes (Signed)
 Physical Therapy Note  Patient Details  Name: David King MRN: [id number] Date of Birth: 1956-09-03 Today's Date: 10/19/2024    Physical Therapist participated in the interdisciplinary team conference, providing clinical information regarding the patient's current status, treatment goals, and weekly focus, including any barriers that need to be addressed. Please see the Inpatient Rehabilitation Team Conference and Plan of Care Update for further details.    David King 10/19/2024, 4:33 PM

## 2024-10-19 NOTE — Progress Notes (Signed)
 Occupational Therapy Note  Patient Details  Name: David King MRN: [id number] Date of Birth: Aug 07, 1956   Occupational Therapist participated in the interdisciplinary team conference, providing clinical information regarding the patient's current status, treatment goals, and weekly focus, including any barriers that need to be addressed. Please see the Inpatient Rehabilitation Team Conference and Plan of Care Update for further details.   Camie Hoe, OTD, OTR/L 10/19/2024, 4:35 PM

## 2024-10-19 NOTE — Progress Notes (Signed)
 Occupational Therapy Session Note  Patient Details  Name: David King MRN: [id number] Date of Birth: 07-03-1956  Today's Date: 10/19/2024 OT Individual Time: 1205-1248 OT Individual Time Calculation (min): 43 min    Short Term Goals: Week 1:  OT Short Term Goal 1 (Week 1): Patient to complete LB dressing with Mod A OT Short Term Goal 1 - Progress (Week 1): Progressing toward goal OT Short Term Goal 2 (Week 1): Patient to perform toileting with Mod A OT Short Term Goal 2 - Progress (Week 1): Progressing toward goal OT Short Term Goal 3 (Week 1): Patient to perform toilet transfer with Min assist OT Short Term Goal 3 - Progress (Week 1): Progressing toward goal OT Short Term Goal 4 (Week 1): Pt will don/doff TLSO brace with Mod A OT Short Term Goal 4 - Progress (Week 1): Progressing toward goal  Skilled Therapeutic Interventions/Progress Updates:      Therapy Documentation Precautions:  Precautions Precautions: Fall, Back Precaution Booklet Issued: Yes (comment) Recall of Precautions/Restrictions: Impaired Precaution/Restrictions Comments: watch BP (orthostatic); RLE foot drop, wound VAC Required Braces or Orthoses: Spinal Brace Spinal Brace: Thoracolumbosacral orthotic, Applied in sitting position Restrictions Weight Bearing Restrictions Per Provider Order: Yes RLE Weight Bearing Per Provider Order: Weight bearing as tolerated Other Position/Activity Restrictions: transfers only on RLE General:  Pt seated in W/C upon OT arrival, agreeable to OT. Wound vac in place.  Pain:  4/10 pain reported in RLE, activity, intermittent rest breaks, distractions provided for pain management, pt reports tolerable to proceed.   Exercises: Pt completed the following exercise circuit in order to improve functional activity, strength and endurance to prepare for ADLs such as bathing. Pt completed the following exercises in seated position with no noted LOB/SOB and 3x10 repetitions on each  exercise: -bicep curls with 5# dowel rod -shoulder abduction with red theraband -chest press with 5# dowel rod    Pt seated in W/C at end of session with lunch, W/C alarm donned, call light within reach and 4Ps assessed.     Therapy/Group: Individual Therapy  Camie Hoe, OTD, OTR/L 10/19/2024, 12:56 PM

## 2024-10-19 NOTE — Progress Notes (Signed)
 PROGRESS NOTE   Subjective/Complaints:  No problems off the oxycontin . Very happy to be off all narcs. Would like to be able to take tylenol  more frequently. Had questions about back brace.   ROS: Patient denies fever, rash, sore throat, blurred vision, dizziness, nausea, vomiting, diarrhea, cough, shortness of breath or chest pain,  headache, or mood change.    Objective:   No results found.  Recent Labs    10/18/24 0705  WBC 8.2  HGB 9.4*  HCT 29.9*  PLT 209   Recent Labs    10/18/24 0705  NA 135  K 3.6  CL 105  CO2 21*  GLUCOSE 94  BUN 17  CREATININE 0.86  CALCIUM  8.3*    Intake/Output Summary (Last 24 hours) at 10/19/2024 0933 Last data filed at 10/19/2024 9367 Gross per 24 hour  Intake 600 ml  Output 2600 ml  Net -2000 ml     Wound 10/11/24 1630 Pressure Injury Heel Right Unstageable - Full thickness tissue loss in which the base of the injury is covered by slough (yellow, tan, gray, green or brown) and/or eschar (tan, brown or black) in the wound bed. (Active)    Physical Exam: Vital Signs Blood pressure 103/88, pulse 82, temperature 97.7 F (36.5 C), resp. rate 16, height 5' 9 (1.753 m), weight 86.2 kg, SpO2 95%.       Constitutional: No distress . Vital signs reviewed. HEENT: NCAT, EOMI, oral membranes moist Neck: supple Cardiovascular: RRR without murmur. No JVD    Respiratory/Chest: CTA Bilaterally without wheezes or rales. Normal effort    GI/Abdomen: BS +, non-tender, non-distended Ext: no clubbing, cyanosis, or edema Psych: pleasant and cooperative  VAC ongoing sanguinous drainage in cannister- no increased amount form yesterday. Good seal.      General: Skin is warm.     Multiple wounds similar as below                              Neurological:     Comments: Patient is alert.     Oriented x 3 and follows commands. CN exam non-focal. Fair insight and  awareness. MMT: BUE 5/5. LLE limited by  pain but appears to be moving all muscle groups. RLE limited by wounds/pain. Sensory exam normal for light touch and pain except for right foot where there is diminished LT along dorsum and to a lesser extent plantar aspect of foot.  No limb ataxia or cerebellar signs. No abnormal tone appreciated.    Prior neuro assessment is c/w 10/19/2024 exam.   Musc: right heel cord tight- -15deg      Assessment/Plan: 1. Functional deficits which require 3+ hours per day of interdisciplinary therapy in a comprehensive inpatient rehab setting. Physiatrist is providing close team supervision and 24 hour management of active medical problems listed below. Physiatrist and rehab team continue to assess barriers to discharge/monitor patient progress toward functional and medical goals  Care Tool:  Bathing    Body parts bathed by patient: Right arm, Left arm, Chest, Abdomen, Face   Body parts bathed by helper: Front perineal area, Buttocks, Right upper leg, Left lower  leg, Right lower leg, Left upper leg     Bathing assist Assist Level: Total Assistance - Patient < 25%     Upper Body Dressing/Undressing Upper body dressing   What is the patient wearing?: Hospital gown only    Upper body assist Assist Level: Moderate Assistance - Patient 50 - 74%    Lower Body Dressing/Undressing Lower body dressing            Lower body assist Assist for lower body dressing: Total Assistance - Patient < 25%     Toileting Toileting    Toileting assist Assist for toileting: Total Assistance - Patient < 25%     Transfers Chair/bed transfer  Transfers assist  Chair/bed transfer activity did not occur: Safety/medical concerns  Chair/bed transfer assist level: 2 Helpers     Locomotion Ambulation   Ambulation assist   Ambulation activity did not occur: Safety/medical concerns          Walk 10 feet activity   Assist  Walk 10 feet activity did not  occur: Safety/medical concerns        Walk 50 feet activity   Assist Walk 50 feet with 2 turns activity did not occur: Safety/medical concerns         Walk 150 feet activity   Assist Walk 150 feet activity did not occur: Safety/medical concerns         Walk 10 feet on uneven surface  activity   Assist Walk 10 feet on uneven surfaces activity did not occur: Safety/medical concerns         Wheelchair     Assist Is the patient using a wheelchair?: Yes Type of Wheelchair: Manual Wheelchair activity did not occur: Safety/medical concerns         Wheelchair 50 feet with 2 turns activity    Assist    Wheelchair 50 feet with 2 turns activity did not occur: Safety/medical concerns       Wheelchair 150 feet activity     Assist  Wheelchair 150 feet activity did not occur: Safety/medical concerns       Blood pressure 103/88, pulse 82, temperature 97.7 F (36.5 C), resp. rate 16, height 5' 9 (1.753 m), weight 86.2 kg, SpO2 95%.  Medical Problem List and Plan: 1. Functional deficits secondary to PAD/compartment syndrome/right leg ischemia/status post thrombectomy.  Status post right leg anterior compartment fasciotomy 9/26 with multiple washouts as well as excisional debridement/wound VAC.  Patient is completing a course of doxycycline             -patient may not yet shower             -ELOS/Goals: 10-14 days, supervision goals    -ordered PRAFO to help stretch right heel cord  -Pt has VAC- need to see who's changing it- and the plan- will d/w PA/NP.  -Continue CIR therapies including PT, SLP and OT. Interdisciplinary team conference today to discuss goals, barriers to discharge, and dc planning.  2.  Antithrombotics: -DVT/anticoagulation:  Pharmaceutical: Heparin  awaiting plan to possibly transition to Eliquis today 10/11/2024 10/14- con't Eliquis             -antiplatelet therapy: Aspirin  81 mg daily 3. Pain Management: Lyrica  150 mg 3 times  daily, OxyContin  sustained-release 10 mg every 12 hours, oxycodone  10 mg every 4 hours as needed breakthrough pain Advil  400 mg every 4 hours as needed  10/14- pain is doing better- overall   10/15- pain still skyrockets with therapy and had to  stop today due to pain  10/17- refused to take anymore prn oxy- agreed to let me keep him on Oxycontin  qam and taper after a few days- due to constipation- said severe and doesn't want anything by tylenol - need to wean Oxycontin  off Monday as well- if pain skyrockets, can restart if needed  10/21 doing well off all meds. Will make tylenol  more flexible for him 4. Mood/Behavior/Sleep: Cymbalta  30 mg nightly, Celexa  20 mg daily             -antipsychotic agents: N/A 5. Neuropsych/cognition: This patient is capable of making decisions on his own behalf. 6. Skin/Wound Care: Routine skin checks             -vac management per WOC RN and Dr. Bianca changes             -local care and pressure relief as needed to multiple other wounds.             -prevalon boot/pressure relief, right heel wound 7. Fluids/Electrolytes/Nutrition: Routine and analysis with follow-up chemistries 8. lumbar radiculopathy/large disc herniation spinal stenosis.  Status post L4-5 transforaminal interbody fusion/posterior lateral fusion insertion interbody device L4-5 decompression 09/08/2024 per Dr. Beuford.  Back brace when out of bed    -10/21 will check Dr. D about whether brace is still needed or at least how much longer 9.  Acute blood loss anemia.  Follow-up CBC  10/14- Pt has ~ 150-200cc out of VAC- not sure since when? Hb down 1/2 unit from 8.9 to 8.4- will recheck Thursday  10/15- Hb will check in AM  10/17- Hb 9.0 10.  AKI.  Resolved after IV hydration. 11.  Hyperlipidemia.  Crestor  12.  COPD/remote tobacco use.  Monitor oxygen  saturations every shift  10/15- was on O2 this AM- per nursing from overnight only- but Sats 89-91%- getting CXR and ABG to see where he is- and  getting sepsis work up.   10/16- started on Unasyn and will start Mucinex for coarse cough-  13.  History of prostate cancer/urinary retention.  History of status post prostatectomy.  External catheter currently in place.  Continue Flomax  0.4 mg daily  10/14- is voiding- but had urinary retention last time- will check a few bladder scans.   10/15 - pt needing in/out caths- had >1400cc in bladder- has cathing protocol now  10/16-10/17 has orders to bladder scan and in/out if volumes >350cc- requiring caths sometimes  10/18 may need to cath more frequently if he's having volumes >1000cc  10/19 cath volumes a little less yesterday--continue with current mgt  10/20- pt's Caths are 280-60cc- doing better 14.  History of detached retina left eye.  Follow-up outpatient ophthalmology. 15. Severe constipation  10/14- LBM at least 4-5 days ago per documentation- pt doesn't remember when went last- will get KUB- and order Sorbitol  30cc- and SSE if no results- having abd pain.   10/16- Pt had 1 liquid stool- but otherwise, no other stools in 6-7 days- last KUB shows on 10/14 was full of stool- will give Sorbitol  since likely has hard stool that liquid getting by- and SSE- if that doesn't work, will ask nursing to do Mg citrate.   10/17- had massive BM  per pt- per chart 1 large, 1 medium-   10/18 smear yesterday. Continue senna-s and increase miralax  to bid.  10/19 two small bm's today  10/21- LBM this 10/20  medium type 4 16.  Hypokalemia  10/14- K+ 3.2- will replete KCL 40 Meq  x1.  17. Low grade temp in setting of delirium  10/15- will get sepsis work up, ammonia level and ABG  10/17- has HCAP- Per #21 18. Cirrhosis on last Abd imaging  10/15- will get Ammonia as well esp with new jerking/tremors.   10/16- is actually shivering this AM, but isn't cold- Ammonia 16- not the cause of Sx's.   10/20- ALT is 47- slightly up but AST down to 37- can con't tylenol  as ordered 19. Infrarenal aortic aneurysm  with mural thrombus- 6.8 cm on last imaging   20. Orthostatic hypotension  10/16- On Flomax , however No BP meds that I can reduce- will check with therapy- if dropping more, will add Midodrine- ordering ACE wrap on LLE  -addendum- adding Midodrine 5 mg TID starting at 630 am  10/19 bp's a little more robust over weekend thus far. No changes today  10/20- BP doing better on midodrine 21. HCAP  10/16- on Unasyn-  will con't for 5-6 days- monitor WBC- down to 12.7 from 13.2 last evening. Shivering could be due to breaking a temp>>??  10/17- no tremors/shivering- will recheck CBC in AM- and make sure on right ABX-  and CMP due to LFTS/renal check on IV ABX- Added Albuterol  scheduled QID due to wheezing and bad COPD  10/18 wbc's down to 8.3, afebrile   -continue unasyn, nebs   -encouraged IS, OOB   -F/u labs Monday   10/20- WBC down to 8.2- will finish Unasyn        LOS: 8 days A FACE TO FACE EVALUATION WAS PERFORMED  Arthea ONEIDA Gunther 10/19/2024, 9:33 AM

## 2024-10-19 NOTE — Patient Care Conference (Signed)
 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 Arthea Gunther Social Worker Present: Graeme Jude, LCSW Nurse Present: Eulalio Falls, RN PT Present: Schuyler Batter, PT OT Present: Camie Hoe, OT SLP Present: Joane Fuss, SLP PPS Coordinator present : Eleanor Colon, SLP     Current Status/Progress Goal Weekly Team Focus  Bowel/Bladder   Continent with bowel with i/o cath.   LBM 10/20   Will remain continent with bowel and free from UTI   Assist with toilet needs and follow i/o cath schedule qshift    Swallow/Nutrition/ Hydration               ADL's   supine>sit EOB with supervision, sit<>stand in [place] with supervision, LB dressing with max A; UB dressing with supervision; pain improving and pt eager to improve   min A/CGA oerall   balance, pain mgmt, BADLs, education    Mobility   Still limited by [facility], but able to stand in stedy min-CGA and tolerating TIS for long stretches   min a overall  upright tolerance, transfer training    Communication                Safety/Cognition/ Behavioral Observations  moderate deficits in memory and mild deficits in problem solving   modI   use of WRAP memory strategies, basic problem  solving, consistency with memory book    Pain   Pain level 5/10 to left heel. Tylenol  1000 mg to mange pain   Will have a pain level <3   Assess pain qshift/prn    Skin   Impaired skin intergrity  Right heel unstageable wound MASD buttocks Skin will be free from s/s of infection.  Promote healing and assist with reposition qshift/prn      Discharge Planning:  Home with wife who can assist but does have knee issues. Wife is here daily to provide support. Pt will go home with wound vac. SW will confirm there are no barriers to discharge.    Team Discussion: Patient was admitted post right leg anterior compartment fasciotomy with multiple washouts as well as excisional debridement/wound VAC due to PAD/compartment syndrome/right leg ischemia. Patient with pain: medication adjusted by MD. Patient progress limited by mild memory deficits, orthostatic hypotension.  Patient on target to meet rehab goals: yes, currently patient needs supervision with upper body dressing and needs max assistance with lower body dressing. Patient was able to stand with minimal assistance using a stedy. Overall goals at discharge are set for minimal assistance.  *See Care Plan and progress notes for long and short-term goals.   Revisions to Treatment Plan:  Tilt n space [facility] consult TEDS  Teaching Needs: Safety, medications, wound care, wound vac care, toileting, transfers, back precautions,etc.   Current  Barriers to Discharge: Decreased caregiver support and Home enviroment access/layout  Possible Resolutions to Barriers: Family Education      Medical Summary Current Status: wounds stable, vac sealed. surgery/woc follwoing.  pain control better. weaning narcs  Barriers to Discharge: Medical stability   Possible Resolutions to [employer] Focus: daily wound assessments. weaning narcotics, more aggressive tylenol . back brace---dc?   Continued Need for Acute Rehabilitation Level of  Care: The patient requires daily medical management by a physician with specialized training in physical medicine and rehabilitation for the following reasons: Direction of a multidisciplinary physical rehabilitation program to maximize functional independence : Yes Medical management of patient stability for increased activity during participation in an intensive rehabilitation regime.: Yes Analysis of laboratory values and/or radiology reports with any subsequent need for medication adjustment and/or medical intervention. : Yes   I attest that I was present, lead the team conference, and concur with the assessment and plan of the team.   David King 10/19/2024, 1104 am

## 2024-10-20 DIAGNOSIS — J189 Pneumonia, unspecified organism: Secondary | ICD-10-CM | POA: Diagnosis not present

## 2024-10-20 DIAGNOSIS — T79A21D Traumatic compartment syndrome of right lower extremity, subsequent encounter: Secondary | ICD-10-CM | POA: Diagnosis not present

## 2024-10-20 DIAGNOSIS — M5416 Radiculopathy, lumbar region: Secondary | ICD-10-CM | POA: Diagnosis not present

## 2024-10-20 DIAGNOSIS — R339 Retention of urine, unspecified: Secondary | ICD-10-CM | POA: Diagnosis not present

## 2024-10-20 MED ORDER — BETHANECHOL CHLORIDE 10 MG PO TABS
10.0000 mg | ORAL_TABLET | Freq: Three times a day (TID) | ORAL | Status: DC
  Administered 2024-10-20 – 2024-10-22 (×7): 10 mg via ORAL
  Filled 2024-10-20 (×7): qty 1

## 2024-10-20 NOTE — Progress Notes (Signed)
 Physical Therapy Session Note  Patient Details  Name: David King MRN: [id number] Date of Birth: April 27, 1956  Today's Date: 10/20/2024 PT Individual Time: 0832-0928, 1300-1410 PT Individual Time Calculation (min): 56 min , 70 min  Short Term Goals: Week 2:  PT Short Term Goal 1 (Week 2): pt will tolerate standing >60 sec without orthostatic hypotension PT Short Term Goal 2 (Week 2): Pt will ambulate 30 ft or >with LRAD PT Short Term Goal 3 (Week 2): Pt will initiate transfer training with RW  Skilled Therapeutic Interventions/Progress Updates:      Treatment Session 1   Nurse and tech assisting pt to [facility] upon arrival with steady. Pt endorses 4/10 R heel and shin pain-requesting pain medicine-notified nurse.   PT donned back brace, L LE ted hose, pants, socks and shoes with total A while pt seated on BSC.   Pt stood with +1 max A with use of steady x1 to perform pericare with total A, x1 to donn pants and brief.   Pt endorses dizziness with standing. Upon return to sitting in TIS assessed vitals BP 100/73 HR 99. Pt endorses symptoms subsided with seated rest break.   Post seated rest break, attempted ambulation with +2 A, however pt self discontinued 2/2 increased dizziness.   Reaminder of session, pt performed seated therex for B LE strengthening/ROM/hemodynamic stability  1x10 B LE LAQ  1x10 B LE seated marching  1x10 ankle pumps (active assisted R LE)  R LE passive hamstring stretch 2 min hold  Encouraged pt to sit up in TIS WC between sessions for improved upright/activity tolerance and hemodynamic standing. Pt verbalized understanding and agreeable.   Pt seated in TIS at end of session with all needs within reach and seatbelt alarm on.    Treatment Session 2   Pt seated in TIS WC upon arrival. Pt agreeable to therapy. Pt denies any pain.   Pt performed sit to stand from [facility] throughout session with RW and CGA/light min A, and BSC with heavy min A; verbal cues provided  for R LE positioning and UE positioning.   Pt ambulated ~6 bouts for total of 55 feet, with prolonged seated rest breaks between ambulation trials. Pt reports intermittent dizziness with ambulation trials however not as bad as this morning session.   Pt performed stand pivot transfer TIS WC to BSC with RW and CGA, verbal cues provided for sequencing/technique. Pt continent of bladder. Pt donned pants with total A while standing with RW and CGA.   Wound care nurse present to move R LE wound vac. Utilized steady 2/2 pt fatigue for return transfer to bed.   Sit to supine with mod A for B LE management.   Pt supine in bed with wound care nurse present at end of session.       Therapy Documentation Precautions:  Precautions Precautions: Fall, Back Precaution Booklet Issued: Yes (comment) Recall of Precautions/Restrictions: Impaired Precaution/Restrictions Comments: watch BP (orthostatic); RLE foot drop, wound VAC Required Braces or Orthoses: Spinal Brace Spinal Brace: Thoracolumbosacral orthotic, Applied in sitting position Restrictions Weight Bearing Restrictions Per Provider Order: Yes RLE Weight Bearing Per Provider Order: Weight bearing as tolerated Other Position/Activity Restrictions: transfers only on RLE  Therapy/Group: Individual Therapy  [facility], [place], DPT  10/20/2024, 7:50 AM

## 2024-10-20 NOTE — Plan of Care (Signed)
  Problem: SCI BOWEL ELIMINATION Goal: RH STG MANAGE BOWEL WITH ASSISTANCE Description: STG Manage Bowel with supervision Assistance. Outcome: Progressing   Problem: SCI BLADDER ELIMINATION Goal: RH STG MANAGE BLADDER WITH ASSISTANCE Description: STG Manage Bladder With supervision Assistance Outcome: Progressing   Problem: RH SKIN INTEGRITY Goal: RH STG SKIN FREE OF INFECTION/BREAKDOWN Description: Manage skin free of infection with supervision assistance Outcome: Progressing   Problem: RH SAFETY Goal: RH STG ADHERE TO SAFETY PRECAUTIONS W/ASSISTANCE/DEVICE Description: STG Adhere to Safety Precautions With Assistance/Device. Outcome: Progressing   Problem: RH PAIN MANAGEMENT Goal: RH STG PAIN MANAGED AT OR BELOW PT'S PAIN GOAL Description: < 4 w/ prns Outcome: Progressing   Problem: RH KNOWLEDGE DEFICIT SCI Goal: RH STG INCREASE KNOWLEDGE OF SELF CARE AFTER SCI Description: Manage increase knowledge of self care after SCI with supervision assistance from wife using educational materials provided Outcome: Progressing

## 2024-10-20 NOTE — Progress Notes (Signed)
 Speech Language Pathology Daily Session Note  Patient Details  Name: David King MRN: [id number] Date of Birth: 1956/04/02  Today's Date: 10/20/2024 SLP Individual Time: 1435-1515 SLP Individual Time Calculation (min): 40 min  Short Term Goals: Week 1: SLP Short Term Goal 1 (Week 1): Pt will recall recent/relevant information w/ supervision SLP Short Term Goal 2 (Week 1): Pt will solve functional problems w/ superivion SLP Short Term Goal 3 (Week 1): Pt will participate in dynamic assessment of all cognitive domains to determine additional tx goals as needed  Skilled Therapeutic Interventions: Skilled therapy session focused on cognitive goals. SLP facilitated completion of medication management task to encourage use of problem solving, memory and organizational skills. SLP  provided supervisionA for patient to complete BID pillbox according to written and verbalized directions. Patient with no mistakes. SLP educate patient on continuing to utilize pillbox upon d/c to aid in planning, organization, and memory. Patient verbalized understanding. SLP continued to challenge patient through identification of medication mistakes task. Patient required supervisionA to identify errors in BID pillbox and correct according to directions. Patient left in bed with alarm set and call bell in reach. Continue POC.   Pain None reported   Therapy/Group: Individual Therapy  Agustina Witzke M.A., CCC-SLP 10/20/2024, 7:43 AM

## 2024-10-20 NOTE — Progress Notes (Signed)
 Occupational Therapy Session Note  Patient Details  Name: David King MRN: [id number] Date of Birth: 04/22/1956  Today's Date: 10/20/2024 OT Individual Time: 1000-1045 OT Individual Time Calculation (min): 45 min    Short Term Goals: Week 1:  OT Short Term Goal 1 (Week 1): Patient to complete LB dressing with Mod A OT Short Term Goal 1 - Progress (Week 1): Progressing toward goal OT Short Term Goal 2 (Week 1): Patient to perform toileting with Mod A OT Short Term Goal 2 - Progress (Week 1): Progressing toward goal OT Short Term Goal 3 (Week 1): Patient to perform toilet transfer with Min assist OT Short Term Goal 3 - Progress (Week 1): Progressing toward goal OT Short Term Goal 4 (Week 1): Pt will don/doff TLSO brace with Mod A OT Short Term Goal 4 - Progress (Week 1): Progressing toward goal  Skilled Therapeutic Interventions/Progress Updates:      Therapy Documentation Precautions:  Precautions Precautions: Fall, Back Precaution Booklet Issued: Yes (comment) Recall of Precautions/Restrictions: Impaired Precaution/Restrictions Comments: watch BP (orthostatic); RLE foot drop, wound VAC Required Braces or Orthoses: Spinal Brace Spinal Brace: Thoracolumbosacral orthotic, Applied in sitting position Restrictions Weight Bearing Restrictions Per Provider Order: No RLE Weight Bearing Per Provider Order: Weight bearing as tolerated Other Position/Activity Restrictions: transfers only on RLE General: Pt seated in W/C upon OT arrival, agreeable to OT. Pt with TED hose on LLE.   Pain:  6/10 pain reported in RLE, activity, intermittent rest breaks, distractions provided for pain management, pt reports tolerable to proceed.   ADL: OT attempting to work on upright tolerance d/t OH. Pt stood at [employer] A with RW with increased dizziness that would not go away. Pt sat down for BP recovery and recliner in TIS.   Exercises: Pt c/o increased tightness in RLE hamstring, reporting inhibiting  progress with functional gains. OT providing gentle progressive stretching to Rt hamstring with noted increased ROM at end of session.   Pt seated in W/C at end of session with W/C alarm donned, call light within reach and 4Ps assessed.    Therapy/Group: Individual Therapy Camie Hoe, OTD, OTR/L 10/20/2024, 5:11 PM

## 2024-10-20 NOTE — Consult Note (Addendum)
 WOC Nurse wound follow up Wound type: surgical R lower leg Measurement: see nursing flow sheets Wound bed: 100% red, moist Drainage (amount, consistency, odor)  serosanguinous Periwound: approximated surgical incision with sutures proximal and distal to wound Dressing procedure/placement/frequency:  Case discussed with Dr Harden, removed NPWT.  Daily wound care as follows:  Cleanse wound with Vashe (lawson # [id number]), place Vashe soaked 4x4 gauze over wound bed, cover with dry gauze, wrap with Kerlix and ace wrap.  Change daily.    WOC team will not follow patient at this time, please re consult if new needs arise.  Thank you, Doyal Polite, MSN, RN, [facility] WOC Team [phone] (Available Mon-Fri 0700-1500)

## 2024-10-20 NOTE — Progress Notes (Signed)
 PROGRESS NOTE   Subjective/Complaints:  Continues to do very well with pain off narcs.  Had bm yesterday. Was cathed 3 x. Excited to have dc date. Hopes it can be sooner!  ROS: Patient denies fever, rash, sore throat, blurred vision, dizziness, nausea, vomiting, diarrhea, cough, shortness of breath or chest pain,   headache, or mood change.    Objective:   No results found.  Recent Labs    10/18/24 0705  WBC 8.2  HGB 9.4*  HCT 29.9*  PLT 209   Recent Labs    10/18/24 0705  NA 135  K 3.6  CL 105  CO2 21*  GLUCOSE 94  BUN 17  CREATININE 0.86  CALCIUM  8.3*    Intake/Output Summary (Last 24 hours) at 10/20/2024 0846 Last data filed at 10/20/2024 9180 Gross per 24 hour  Intake 620 ml  Output 980 ml  Net -360 ml     Wound 10/11/24 1630 Pressure Injury Heel Right Unstageable - Full thickness tissue loss in which the base of the injury is covered by slough (yellow, tan, gray, green or brown) and/or eschar (tan, brown or black) in the wound bed. (Active)    Physical Exam: Vital Signs Blood pressure 114/84, pulse 80, temperature 98.3 F (36.8 C), resp. rate 18, height 5' 9 (1.753 m), weight 86.2 kg, SpO2 96%.       Constitutional: No distress . Vital signs reviewed. HEENT: NCAT, EOMI, oral membranes moist Neck: supple Cardiovascular: RRR without murmur. No JVD    Respiratory/Chest: CTA Bilaterally without wheezes or rales. Normal effort    GI/Abdomen: BS +, non-tender, non-distended Ext: no clubbing, cyanosis, or edema Psych: pleasant and cooperative  VAC ongoing sanguinous drainage in cannister- no increased amount form yesterday. Good seal remains.      General: Skin is warm.     Multiple wounds similar as below                              Neurological:     Comments: Patient is alert.     Oriented x 3 and follows commands. CN exam non-focal. Fair insight and awareness. MMT: BUE  5/5. LLE limited by  pain but appears to be moving all muscle groups. RLE limited by wounds/dressings, etc. Sensory exam normal for light touch and pain except for right foot where there is diminished LT along dorsum and to a lesser extent plantar aspect of foot.  No limb ataxia or cerebellar signs. No abnormal tone appreciated.    Prior neuro assessment is c/w 10/20/2024 exam.   Musc: right heel cord tight- still about  minus 10-15deg      Assessment/Plan: 1. Functional deficits which require 3+ hours per day of interdisciplinary therapy in a comprehensive inpatient rehab setting. Physiatrist is providing close team supervision and 24 hour management of active medical problems listed below. Physiatrist and rehab team continue to assess barriers to discharge/monitor patient progress toward functional and medical goals  Care Tool:  Bathing    Body parts bathed by patient: Right arm, Left arm, Chest, Abdomen, Face   Body parts bathed by helper: Front perineal area,  Buttocks, Right upper leg, Left lower leg, Right lower leg, Left upper leg     Bathing assist Assist Level: Total Assistance - Patient < 25%     Upper Body Dressing/Undressing Upper body dressing   What is the patient wearing?: Hospital gown only    Upper body assist Assist Level: Moderate Assistance - Patient 50 - 74%    Lower Body Dressing/Undressing Lower body dressing            Lower body assist Assist for lower body dressing: Total Assistance - Patient < 25%     Toileting Toileting    Toileting assist Assist for toileting: Total Assistance - Patient < 25%     Transfers Chair/bed transfer  Transfers assist  Chair/bed transfer activity did not occur: Safety/medical concerns  Chair/bed transfer assist level: 2 Helpers     Locomotion Ambulation   Ambulation assist   Ambulation activity did not occur: Safety/medical concerns  Assist level: 2 helpers Assistive device: Walker-rolling Max  distance: 15   Walk 10 feet activity   Assist  Walk 10 feet activity did not occur: Safety/medical concerns  Assist level: 2 helpers Assistive device: Walker-rolling   Walk 50 feet activity   Assist Walk 50 feet with 2 turns activity did not occur: Safety/medical concerns         Walk 150 feet activity   Assist Walk 150 feet activity did not occur: Safety/medical concerns         Walk 10 feet on uneven surface  activity   Assist Walk 10 feet on uneven surfaces activity did not occur: Safety/medical concerns         Wheelchair     Assist Is the patient using a wheelchair?: Yes Type of Wheelchair: Manual Wheelchair activity did not occur: Safety/medical concerns         Wheelchair 50 feet with 2 turns activity    Assist    Wheelchair 50 feet with 2 turns activity did not occur: Safety/medical concerns       Wheelchair 150 feet activity     Assist  Wheelchair 150 feet activity did not occur: Safety/medical concerns       Blood pressure 114/84, pulse 80, temperature 98.3 F (36.8 C), resp. rate 18, height 5' 9 (1.753 m), weight 86.2 kg, SpO2 96%.  Medical Problem List and Plan: 1. Functional deficits secondary to PAD/compartment syndrome/right leg ischemia/status post thrombectomy.  Status post right leg anterior compartment fasciotomy 9/26 with multiple washouts as well as excisional debridement/wound VAC.  Patient is completing a course of doxycycline             -patient may not yet shower             -ELOS/Goals: 11/11,  supervision goals    -ordered PRAFO to help stretch right heel cord  -Pt has VAC- need to see who's changing it- and the plan- will d/w PA/NP.  --Continue CIR therapies including PT, OT, and SLP  2.  Antithrombotics: -DVT/anticoagulation:  Pharmaceutical: Heparin  awaiting plan to possibly transition to Eliquis today 10/11/2024 10/14- con't Eliquis             -antiplatelet therapy: Aspirin  81 mg daily 3. Pain  Management: Lyrica  150 mg 3 times daily, OxyContin  sustained-release 10 mg every 12 hours, oxycodone  10 mg every 4 hours as needed breakthrough pain Advil  400 mg every 4 hours as needed  10/14- pain is doing better- overall   10/15- pain still skyrockets with therapy and had to  stop today due to pain  10/17- refused to take anymore prn oxy- agreed to let me keep him on Oxycontin  qam and taper after a few days- due to constipation- said severe and doesn't want anything by tylenol - need to wean Oxycontin  off Monday as well- if pain skyrockets, can restart if needed  10/21-22 doing well off all meds. Made tylenol  more flexible for him 4. Mood/Behavior/Sleep: Cymbalta  30 mg nightly, Celexa  20 mg daily             -antipsychotic agents: N/A 5. Neuropsych/cognition: This patient is capable of making decisions on his own behalf. 6. Skin/Wound Care: Routine skin checks             -vac management per WOC RN and Dr. Bianca changes             -local care and pressure relief as needed to multiple other wounds. doxycycline             -prevalon boot/pressure relief, right heel wound 7. Fluids/Electrolytes/Nutrition: Routine and analysis with follow-up chemistries 8. lumbar radiculopathy/large disc herniation spinal stenosis.  Status post L4-5 transforaminal interbody fusion/posterior lateral fusion insertion interbody device L4-5 decompression 09/08/2024 per Dr. Beuford.  Back brace when out of bed    -10/21 will check Dr. D about whether brace is still needed or at least how much longer 9.  Acute blood loss anemia.  Follow-up CBC  10/14- Pt has ~ 150-200cc out of VAC- not sure since when? Hb down 1/2 unit from 8.9 to 8.4- will recheck Thursday  10/15- Hb will check in AM  10/17- Hb 9.0 10.  AKI.  Resolved after IV hydration. 11.  Hyperlipidemia.  Crestor  12.  COPD/remote tobacco use.  Monitor oxygen  saturations every shift  10/15- was on O2 this AM- per nursing from overnight only- but Sats 89-91%-  getting CXR and ABG to see where he is- and getting sepsis work up.   10/16- started on Unasyn and will start Mucinex for coarse cough-  13.  History of prostate cancer/urinary retention.  History of status post prostatectomy.  External catheter currently in place.  Continue Flomax  0.4 mg daily  10/14- is voiding- but had urinary retention last time- will check a few bladder scans.   10/15 - pt needing in/out caths- had >1400cc in bladder- has cathing protocol now  10/16-10/17 has orders to bladder scan and in/out if volumes >350cc- requiring caths sometimes  10/18 may need to cath more frequently if he's having volumes >1000cc  10/19 cath volumes a little less yesterday--continue with current mgt  10/22- pt's Caths 200-500cc   -will try low dose urecholine   -up to EOB/toilet to empty 14.  History of detached retina left eye.  Follow-up outpatient ophthalmology. 15. Severe constipation  10/14- LBM at least 4-5 days ago per documentation- pt doesn't remember when went last- will get KUB- and order Sorbitol  30cc- and SSE if no results- having abd pain.   10/16- Pt had 1 liquid stool- but otherwise, no other stools in 6-7 days- last KUB shows on 10/14 was full of stool- will give Sorbitol  since likely has hard stool that liquid getting by- and SSE- if that doesn't work, will ask nursing to do Mg citrate.   10/17- had massive BM  per pt- per chart 1 large, 1 medium-   10/18 smear yesterday. Continue senna-s and increase miralax  to bid.  10/19 two small bm's today  10/22 LBM   10/21    16.  Hypokalemia  10/14- K+ 3.2- will replete KCL 40 Meq x1.  17. Low grade temp in setting of delirium  10/15- will get sepsis work up, ammonia level and ABG  10/17- has HCAP- Per #21 18. Cirrhosis on last Abd imaging  10/15- will get Ammonia as well esp with new jerking/tremors.   10/16- is actually shivering this AM, but isn't cold- Ammonia 16- not the cause of Sx's.   10/20- ALT is 47- slightly up but AST  down to 37- can con't tylenol  as ordered 19. Infrarenal aortic aneurysm with mural thrombus- 6.8 cm on last imaging   20. Orthostatic hypotension  10/16- On Flomax , however No BP meds that I can reduce- will check with therapy- if dropping more, will add Midodrine- ordering ACE wrap on LLE  -addendum- adding Midodrine 5 mg TID starting at 630 am  10/19 bp's a little more robust over weekend thus far. No changes today  10/22 improved on midodrine 21. HCAP  10/16- on Unasyn-  will con't for 5-6 days- monitor WBC- down to 12.7 from 13.2 last evening. Shivering could be due to breaking a temp>>??  10/17- no tremors/shivering- will recheck CBC in AM- and make sure on right ABX-  and CMP due to LFTS/renal check on IV ABX- Added Albuterol  scheduled QID due to wheezing and bad COPD     10/20- WBC down to 8.2-   Unasyn completed        LOS: 9 days A FACE TO FACE EVALUATION WAS PERFORMED  Arthea ONEIDA Gunther 10/20/2024, 8:46 AM

## 2024-10-20 NOTE — Progress Notes (Addendum)
 Patient ID: David King, male   DOB: 1956/08/08, 68 y.o.   MRN: [id number]  SW waiting on updates from Angie/[facility] to follow-up if able to accept. *[facility] accepted referral.   *wound vac removed today and being charged to WTD dressing.   Declined HHAs Cory[facility]- staffing [facility]- no [facility] benefits Lynette/[facility]- staffing   Graeme Jude, MSW, LCSW Office: [phone] Cell: [phone] Fax: [phone]

## 2024-10-21 DIAGNOSIS — T79A21D Traumatic compartment syndrome of right lower extremity, subsequent encounter: Secondary | ICD-10-CM | POA: Diagnosis not present

## 2024-10-21 DIAGNOSIS — R339 Retention of urine, unspecified: Secondary | ICD-10-CM | POA: Diagnosis not present

## 2024-10-21 DIAGNOSIS — M5416 Radiculopathy, lumbar region: Secondary | ICD-10-CM | POA: Diagnosis not present

## 2024-10-21 DIAGNOSIS — J189 Pneumonia, unspecified organism: Secondary | ICD-10-CM | POA: Diagnosis not present

## 2024-10-21 LAB — BASIC METABOLIC PANEL WITH GFR
Anion gap: 10 (ref 5–15)
BUN: 23 mg/dL (ref 8–23)
CO2: 23 mmol/L (ref 22–32)
Calcium: 9 mg/dL (ref 8.9–10.3)
Chloride: 107 mmol/L (ref 98–111)
Creatinine, Ser: 0.96 mg/dL (ref 0.61–1.24)
GFR, Estimated: >60 mL/min (ref >60)
Glucose, Bld: 97 mg/dL (ref 70–99)
Potassium: 3.7 mmol/L (ref 3.5–5.1)
Sodium: 140 mmol/L (ref 135–145)

## 2024-10-21 LAB — CBC WITH DIFFERENTIAL/PLATELET
Abs Immature Granulocytes: 0.31 K/uL — ABNORMAL HIGH (ref 0.00–0.07)
Basophils Absolute: 0.1 K/uL (ref 0.0–0.1)
Basophils Relative: 1 %
Eosinophils Absolute: 0.6 K/uL — ABNORMAL HIGH (ref 0.0–0.5)
Eosinophils Relative: 6 %
HCT: 31.1 % — ABNORMAL LOW (ref 39.0–52.0)
Hemoglobin: 9.8 g/dL — ABNORMAL LOW (ref 13.0–17.0)
Immature Granulocytes: 3 %
Lymphocytes Relative: 16 %
Lymphs Abs: 1.5 K/uL (ref 0.7–4.0)
MCH: 29.6 pg (ref 26.0–34.0)
MCHC: 31.5 g/dL (ref 30.0–36.0)
MCV: 94 fL (ref 80.0–100.0)
Monocytes Absolute: 0.3 K/uL (ref 0.1–1.0)
Monocytes Relative: 3 %
Neutro Abs: 6.6 K/uL (ref 1.7–7.7)
Neutrophils Relative %: 71 %
Platelets: 238 K/uL (ref 150–400)
RBC: 3.31 MIL/uL — ABNORMAL LOW (ref 4.22–5.81)
RDW: 19.2 % — ABNORMAL HIGH (ref 11.5–15.5)
WBC: 9.4 K/uL (ref 4.0–10.5)
nRBC: 0 % (ref 0.0–0.2)

## 2024-10-21 NOTE — Progress Notes (Signed)
 Physical Therapy Session Note  Patient Details  Name: David King MRN: [id number] Date of Birth: September 21, 1956  Today's Date: 10/21/2024 PT Individual Time: 1115-1200 PT Individual Time Calculation (min): 45 min   Short Term Goals: Week 2:  PT Short Term Goal 1 (Week 2): pt will tolerate standing >60 sec without orthostatic hypotension PT Short Term Goal 2 (Week 2): Pt will ambulate 30 ft or >with LRAD PT Short Term Goal 3 (Week 2): Pt will initiate transfer training with RW  Skilled Therapeutic Interventions/Progress Updates:    Pt seated in w/c on arrival and agreeable to therapy. Pt with continued RLE pain, but reports improved without wound vac. Therapy to tolerance. Pt transported to therapy gym for time management and energy conservation.   Stand pivot transfer with min a and RW to mat table. Pt then performed 4 x 10 Sit to stand for LE strength and endurance, cues for RLE extension for stretching. Pt with x 2 incidence of knee buckling requiring mod a. Pt then ambulated x 20 ft and x 30 ft with min a and w/c follow. RLE noted to sink into flexion with fatigue. Pt returned to room and remained up in TIS with needs in reach.   Therapy Documentation Precautions:  Precautions Precautions: Fall, Back Precaution Booklet Issued: Yes (comment) Recall of Precautions/Restrictions: Impaired Precaution/Restrictions Comments: watch BP (orthostatic); RLE foot drop, wound VAC Required Braces or Orthoses: Spinal Brace Spinal Brace: Thoracolumbosacral orthotic, Applied in sitting position Restrictions Weight Bearing Restrictions Per Provider Order: Yes RLE Weight Bearing Per Provider Order: Weight bearing as tolerated Other Position/Activity Restrictions: transfers only on RLE General:       Therapy/Group: Individual Therapy  [name] 10/21/2024, 4:16 PM

## 2024-10-21 NOTE — Progress Notes (Signed)
 Occupational Therapy Session Note  Patient Details  Name: David King MRN: [id number] Date of Birth: September 24, 1956  Today's Date: 10/21/2024 OT Individual Time: 1305-1400 OT Individual Time Calculation (min): 55 min    Short Term Goals: Week 2:  OT Short Term Goal 1 (Week 2): Patient to complete LB dressing with Mod A OT Short Term Goal 2 (Week 2): Patient to perform toileting with Mod A OT Short Term Goal 3 (Week 2): Patient to perform toilet transfer with Min assist OT Short Term Goal 4 (Week 2): Pt will don/doff TLSO brace with Mod A  Skilled Therapeutic Interventions/Progress Updates:    Skilled OT intervention with focus on sit<>stand, funcitonal amb with RW, toilet transfers, toileting, bed mobility, and activity tolerance to increase independence with BADLs. Sit<>stand from w/c with CGA. Standing balance with CGA. Pt requested to use toilet and transitioned to public restroom off of day room. Toilet transfer with min A using grab bar. Max A for toileting tasks. Sit<>stand from lower toilet seat with mod a. Standing for OTA to assist with hygiene and clothing mgmt. No reports of dizziness. Pt returned to room and amb with RW from doorway to EOB with CGA. Sit>supine with supervision. Pt remained in bed with all needs within reach.  Therapy Documentation Precautions:  Precautions Precautions: Fall, Back Precaution Booklet Issued: Yes (comment) Recall of Precautions/Restrictions: Impaired Precaution/Restrictions Comments: watch BP (orthostatic); RLE foot drop, wound VAC Required Braces or Orthoses: Spinal Brace Spinal Brace: Thoracolumbosacral orthotic, Applied in sitting position Restrictions Weight Bearing Restrictions Per Provider Order: Yes RLE Weight Bearing Per Provider Order: Weight bearing as tolerated Other Position/Activity Restrictions: transfers only on RLE   Pain:  I feel like I have a 'charley horse in my right calf relief with stretching    Therapy/Group:  Individual Therapy  [name] 10/21/2024, 2:42 PM

## 2024-10-21 NOTE — Progress Notes (Signed)
 Patient was again evaluated at his bedside.  He is in excellent spirits today.  He was seated at the sink brushing his teeth upon my arrival.  He is now off of all pain medications, and he is quite excited about this.  His wound VAC has been removed.  His pain is much improved.  His lower back wound was again evaluated today, and is healing very well, with no erythema or drainage.  He does continue to have a foot drop on the right, and I have once again recommended an AFO brace.  His occupational therapist was at his bedside this morning as well, and it is his understanding that that is due to be arranged in the near future, potentially today.  The patient's preoperative left leg pain continues to be resolved.  He no longer needs to maintain utilization of his TLSO brace.  I did revise his brace to an brace, which he only needs to wear when he is standing and ambulating.  This was discussed with the patient.  His lower back restrictions continue to remain in place.  I did try to reach out to his wife Ginny to touch base with, but there was no answer.  I will continue to follow Mr. Abbe progress.

## 2024-10-21 NOTE — Progress Notes (Signed)
 PROGRESS NOTE   Subjective/Complaints:  Pt up on BSC with OT. Missed the bowl initially. Still requiring I/O caths yesterday. Had the urge to empty today. Pain levels controlled. Asked if we'd heard anything about his TLSO. Having occasional shooting pain, usually controlled with tylenol  if he needs it.   ROS: Patient denies fever, rash, sore throat, blurred vision, dizziness, nausea, vomiting, diarrhea, cough, shortness of breath or chest pain, , headache, or mood change.    Objective:   No results found.  Recent Labs    10/21/24 0821  WBC 9.4  HGB 9.8*  HCT 31.1*  PLT 238   Recent Labs    10/21/24 0504  NA 140  K 3.7  CL 107  CO2 23  GLUCOSE 97  BUN 23  CREATININE 0.96  CALCIUM  9.0    Intake/Output Summary (Last 24 hours) at 10/21/2024 0929 Last data filed at 10/21/2024 9185 Gross per 24 hour  Intake 480 ml  Output 1100 ml  Net -620 ml     Wound 10/11/24 1630 Pressure Injury Heel Right Unstageable - Full thickness tissue loss in which the base of the injury is covered by slough (yellow, tan, gray, green or brown) and/or eschar (tan, brown or black) in the wound bed. (Active)    Physical Exam: Vital Signs Blood pressure 106/73, pulse 80, temperature 98.4 F (36.9 C), resp. rate 18, height 5' 9 (1.753 m), weight 86.2 kg, SpO2 99%.      Constitutional: No distress . Vital signs reviewed. HEENT: NCAT, EOMI, oral membranes moist Neck: supple Cardiovascular: RRR without murmur. No JVD    Respiratory/Chest: CTA Bilaterally without wheezes or rales. Normal effort    GI/Abdomen: BS +, non-tender, non-distended Ext: no clubbing, cyanosis, or edema Psych: pleasant and cooperative  VAC ongoing sanguinous drainage in cannister- no increased amount form yesterday. Good seal remains.      General: Skin is warm. Wounds presenting still similar to below                                     Neurological:     Comments: Patient is alert.     Oriented x 3 and follows commands. CN exam non-focal. Fair insight and awareness. MMT: BUE 5/5. LLE limited by  pain but appears to be moving all muscle groups. RLE limited by wounds/dressings, etc. Sensory exam normal for light touch and pain except for right foot where there is diminished LT along dorsum and to a lesser extent plantar aspect of foot.  No limb ataxia or cerebellar signs. No abnormal tone appreciated.    Prior neuro assessment is c/w 10/21/2024 exam.   Musc: right heel cord tight- still about  minus 10-deg      Assessment/Plan: 1. Functional deficits which require 3+ hours per day of interdisciplinary therapy in a comprehensive inpatient rehab setting. Physiatrist is providing close team supervision and 24 hour management of active medical problems listed below. Physiatrist and rehab team continue to assess barriers to discharge/monitor patient progress toward functional and medical goals  Care Tool:  Bathing    Body parts bathed  by patient: Right arm, Left arm, Chest, Abdomen, Face   Body parts bathed by helper: Front perineal area, Buttocks, Right upper leg, Left lower leg, Right lower leg, Left upper leg     Bathing assist Assist Level: Total Assistance - Patient < 25%     Upper Body Dressing/Undressing Upper body dressing   What is the patient wearing?: Hospital gown only    Upper body assist Assist Level: Moderate Assistance - Patient 50 - 74%    Lower Body Dressing/Undressing Lower body dressing            Lower body assist Assist for lower body dressing: Total Assistance - Patient < 25%     Toileting Toileting    Toileting assist Assist for toileting: Total Assistance - Patient < 25%     Transfers Chair/bed transfer  Transfers assist  Chair/bed transfer activity did not occur: Safety/medical concerns  Chair/bed transfer assist level: 2 Helpers     Locomotion Ambulation   Ambulation  assist   Ambulation activity did not occur: Safety/medical concerns  Assist level: 2 helpers Assistive device: Walker-rolling Max distance: 15   Walk 10 feet activity   Assist  Walk 10 feet activity did not occur: Safety/medical concerns  Assist level: 2 helpers Assistive device: Walker-rolling   Walk 50 feet activity   Assist Walk 50 feet with 2 turns activity did not occur: Safety/medical concerns         Walk 150 feet activity   Assist Walk 150 feet activity did not occur: Safety/medical concerns         Walk 10 feet on uneven surface  activity   Assist Walk 10 feet on uneven surfaces activity did not occur: Safety/medical concerns         Wheelchair     Assist Is the patient using a wheelchair?: Yes Type of Wheelchair: Manual Wheelchair activity did not occur: Safety/medical concerns         Wheelchair 50 feet with 2 turns activity    Assist    Wheelchair 50 feet with 2 turns activity did not occur: Safety/medical concerns       Wheelchair 150 feet activity     Assist  Wheelchair 150 feet activity did not occur: Safety/medical concerns       Blood pressure 106/73, pulse 80, temperature 98.4 F (36.9 C), resp. rate 18, height 5' 9 (1.753 m), weight 86.2 kg, SpO2 99%.  Medical Problem List and Plan: 1. Functional deficits secondary to PAD/compartment syndrome/right leg ischemia/status post thrombectomy.  Status post right leg anterior compartment fasciotomy 9/26 with multiple washouts as well as excisional debridement/wound VAC.  Patient is completing a course of doxycycline             -patient may not yet shower             -ELOS/Goals: 11/11,  supervision goals    -ordered PRAFO to help stretch right heel cord  -Pt has VAC- need to see who's changing it- and the plan- will d/w PA/NP.  -Continue CIR therapies including PT, OT, and SLP   2.  Antithrombotics: -DVT/anticoagulation:  Pharmaceutical: Heparin  awaiting plan to  possibly transition to Eliquis today 10/11/2024 10/14- con't Eliquis             -antiplatelet therapy: Aspirin  81 mg daily 3. Pain Management: Lyrica  150 mg 3 times daily,  tylenol .    10/22 doing well with tylenol  alone. Off all narcotics 4. Mood/Behavior/Sleep: Cymbalta  30 mg nightly, Celexa  20 mg  daily             -antipsychotic agents: N/A 5. Neuropsych/cognition: This patient is capable of making decisions on his own behalf. 6. Skin/Wound Care: Routine skin checks             -vac management per WOC RN and Dr. Bianca changes             -local care and pressure relief as needed to multiple other wounds. doxycycline             -prevalon boot/pressure relief, right heel wound  -continue current plan with wounds 7. Fluids/Electrolytes/Nutrition: Routine and analysis with follow-up chemistries 8. lumbar radiculopathy/large disc herniation spinal stenosis.  Status post L4-5 transforaminal interbody fusion/posterior lateral fusion insertion interbody device L4-5 decompression 09/08/2024 per Dr. Beuford.  Back brace when out of bed    -10/23. Dr. JONETTA to follow up regarding brace need 9.  Acute blood loss anemia.  Follow-up CBC  10/14- Pt has ~ 150-200cc out of VAC- not sure since when? Hb down 1/2 unit from 8.9 to 8.4- will recheck Thursday  10/15- Hb will check in AM  10/17- Hb 9.0 10.  AKI.  Resolved after IV hydration. 11.  Hyperlipidemia.  Crestor  12.  COPD/remote tobacco use.  Monitor oxygen  saturations every shift  10/15- was on O2 this AM- per nursing from overnight only- but Sats 89-91%- getting CXR and ABG to see where he is- and getting sepsis work up.   10/16- started on Unasyn and will start Mucinex for coarse cough-  13.  History of prostate cancer/urinary retention.  History of status post prostatectomy.  External catheter currently in place.  Continue Flomax  0.4 mg daily  10/14- is voiding- but had urinary retention last time- will check a few bladder scans.   10/15 - pt  needing in/out caths- had >1400cc in bladder- has cathing protocol now  10/16-10/17 has orders to bladder scan and in/out if volumes >350cc- requiring caths sometimes  10/18 may need to cath more frequently if he's having volumes >1000cc  10/19 cath volumes a little less yesterday--continue with current mgt  10/22-23- pt's Caths 200-700cc   -continue low dose urecholine   -up to EOB/toilet to empty--doing so today with output 14.  History of detached retina left eye.  Follow-up outpatient ophthalmology. 15. Severe constipation  10/14- LBM at least 4-5 days ago per documentation- pt doesn't remember when went last- will get KUB- and order Sorbitol  30cc- and SSE if no results- having abd pain.   10/16- Pt had 1 liquid stool- but otherwise, no other stools in 6-7 days- last KUB shows on 10/14 was full of stool- will give Sorbitol  since likely has hard stool that liquid getting by- and SSE- if that doesn't work, will ask nursing to do Mg citrate.   10/17- had massive BM  per pt- per chart 1 large, 1 medium-   10/18 smear yesterday. Continue senna-s and increase miralax  to bid.  10/19 two small bm's today  10/22 LBM   10/22    16.  Hypokalemia  10/14- K+ 3.2- will replete KCL 40 Meq x1.  17. Low grade temp in setting of delirium  10/15- will get sepsis work up, ammonia level and ABG  10/17- has HCAP- Per #21 18. Cirrhosis on last Abd imaging  10/15- will get Ammonia as well esp with new jerking/tremors.   10/16- is actually shivering this AM, but isn't cold- Ammonia 16- not the cause of Sx's.  10/20- ALT is 47- slightly up but AST down to 37- can con't tylenol  as ordered 19. Infrarenal aortic aneurysm with mural thrombus- 6.8 cm on last imaging   20. Orthostatic hypotension  10/16- On Flomax , however No BP meds that I can reduce- will check with therapy- if dropping more, will add Midodrine- ordering ACE wrap on LLE  -addendum- adding Midodrine 5 mg TID starting at 630 am  10/19 bp's a  little more robust over weekend thus far. No changes today  10/22-23 improved on midodrine 21. HCAP  10/16- on Unasyn-  will con't for 5-6 days- monitor WBC- down to 12.7 from 13.2 last evening. Shivering could be due to breaking a temp>>??  10/17- no tremors/shivering- will recheck CBC in AM- and make sure on right ABX-  and CMP due to LFTS/renal check on IV ABX- Added Albuterol  scheduled QID due to wheezing and bad COPD     10/20- WBC down to 8.2-   Unasyn completed        LOS: 10 days A FACE TO FACE EVALUATION WAS PERFORMED  Arthea ONEIDA Gunther 10/21/2024, 9:29 AM

## 2024-10-21 NOTE — Plan of Care (Signed)
  Problem: SCI BOWEL ELIMINATION Goal: RH STG MANAGE BOWEL WITH ASSISTANCE Description: STG Manage Bowel with supervision Assistance. Outcome: Progressing   Problem: SCI BLADDER ELIMINATION Goal: RH STG MANAGE BLADDER WITH ASSISTANCE Description: STG Manage Bladder With supervision Assistance Outcome: Progressing   Problem: RH SKIN INTEGRITY Goal: RH STG SKIN FREE OF INFECTION/BREAKDOWN Description: Manage skin free of infection with supervision assistance Outcome: Progressing   Problem: RH SAFETY Goal: RH STG ADHERE TO SAFETY PRECAUTIONS W/ASSISTANCE/DEVICE Description: STG Adhere to Safety Precautions With Assistance/Device. Outcome: Progressing   Problem: RH PAIN MANAGEMENT Goal: RH STG PAIN MANAGED AT OR BELOW PT'S PAIN GOAL Description: < 4 w/ prns Outcome: Progressing   Problem: RH KNOWLEDGE DEFICIT SCI Goal: RH STG INCREASE KNOWLEDGE OF SELF CARE AFTER SCI Description: Manage increase knowledge of self care after SCI with supervision assistance from wife using educational materials provided Outcome: Progressing

## 2024-10-21 NOTE — Progress Notes (Signed)
 Occupational Therapy Session Note  Patient Details  Name: David King MRN: [id number] Date of Birth: [birth year]  Today's Date: 10/21/2024 OT Individual Time: 0815-0930 OT Individual Time Calculation (min): 75 min    Short Term Goals: Week 2:  OT Short Term Goal 1 (Week 2): Patient to complete LB dressing with Mod A OT Short Term Goal 2 (Week 2): Patient to perform toileting with Mod A OT Short Term Goal 3 (Week 2): Patient to perform toilet transfer with Min assist OT Short Term Goal 4 (Week 2): Pt will don/doff TLSO brace with Mod A  Skilled Therapeutic Interventions/Progress Updates:    Pt resting in bed upon arrival. Skilled OT intervention with focus on bed mobility, sitting balance, sit<>stand, standing balance, functional transfers, toileting, LB dressing and activity tolerance to increase independence with BADLs. Supine>sit EOB with supervision. Sitting balance with supervision. Pt requested to use toilet and BSC positioned next to bed. Transfer with CGA. Max A for toileting (hygiene and pulling pants up.) Pt initiated pulling pants over hips but required assistance to complete task. Grooming at sink with setup. MD entered room and changed TLSO to LSO. Pt remained seated in w/c with all needs within reach.   Therapy Documentation Precautions:  Precautions Precautions: Fall, Back Precaution Booklet Issued: Yes (comment) Recall of Precautions/Restrictions: Impaired Precaution/Restrictions Comments: watch BP (orthostatic); RLE foot drop, wound VAC Required Braces or Orthoses: Spinal Brace Spinal Brace: Thoracolumbosacral orthotic, Applied in sitting position Restrictions Weight Bearing Restrictions Per Provider Order: Yes RLE Weight Bearing Per Provider Order: Weight bearing as tolerated Other Position/Activity Restrictions: transfers only on RLE    Pain:  Pt denies pain this morning   Therapy/Group: Individual Therapy  [name] 10/21/2024, 12:16 PM

## 2024-10-21 NOTE — Progress Notes (Signed)
 Occupational Therapy Session Note  Patient Details  Name: David King MRN: [id number] Date of Birth: [birth year]  Today's Date: 10/21/2024 OT Individual Time: 1000-1045 OT Individual Time Calculation (min): 45 min    Short Term Goals: Week 2:  OT Short Term Goal 1 (Week 2): Patient to complete LB dressing with Mod A OT Short Term Goal 2 (Week 2): Patient to perform toileting with Mod A OT Short Term Goal 3 (Week 2): Patient to perform toilet transfer with Min assist OT Short Term Goal 4 (Week 2): Pt will don/doff TLSO brace with Mod A  Skilled Therapeutic Interventions/Progress Updates:    Skilled OT intervention with focus on sit<>stand, functional transfers, BLE activity for general strengthening and conditioning to increase independence with BADLs. All sit<>stand and standing balance with CGA/min A. Stand step transfer w/c<>NuStep with min A. Pt with reported dizziness with sit<>stand and transfer. Resolved (partially) with rest. NuStep level 1 for approx 5 mins with pt reported fatigue/dizziness. Transfer back to w/c and return to room. Pt remained in w/c with all needs within reach. Pt disappointed that he was unable to [facility] as much during session. Emotional support and encouragement provided.   Therapy Documentation Precautions:  Precautions Precautions: Fall, Back Precaution Booklet Issued: Yes (comment) Recall of Precautions/Restrictions: Impaired Precaution/Restrictions Comments: watch BP (orthostatic); RLE foot drop, wound VAC Required Braces or Orthoses: Spinal Brace Spinal Brace: Thoracolumbosacral orthotic, Applied in sitting position Restrictions Weight Bearing Restrictions Per Provider Order: Yes RLE Weight Bearing Per Provider Order: Weight bearing as tolerated Other Position/Activity Restrictions: transfers only on RLE   Pain:  Pt reported some tightness RLE gastroc with NuStep; relief reported with stretching   Therapy/Group: Individual  Therapy  [name] 10/21/2024, 12:16 PM

## 2024-10-22 ENCOUNTER — Encounter (HOSPITAL_COMMUNITY): Payer: Self-pay | Admitting: Physical Medicine and Rehabilitation

## 2024-10-22 ENCOUNTER — Other Ambulatory Visit: Payer: Self-pay

## 2024-10-22 DIAGNOSIS — J189 Pneumonia, unspecified organism: Secondary | ICD-10-CM | POA: Diagnosis not present

## 2024-10-22 DIAGNOSIS — R339 Retention of urine, unspecified: Secondary | ICD-10-CM | POA: Diagnosis not present

## 2024-10-22 DIAGNOSIS — T79A21D Traumatic compartment syndrome of right lower extremity, subsequent encounter: Secondary | ICD-10-CM | POA: Diagnosis not present

## 2024-10-22 DIAGNOSIS — M5416 Radiculopathy, lumbar region: Secondary | ICD-10-CM | POA: Diagnosis not present

## 2024-10-22 MED ORDER — HYDROCERIN EX CREA
TOPICAL_CREAM | Freq: Two times a day (BID) | CUTANEOUS | Status: DC
  Administered 2024-10-25: 1 via TOPICAL
  Filled 2024-10-22: qty 113

## 2024-10-22 MED ORDER — BETHANECHOL CHLORIDE 10 MG PO TABS
25.0000 mg | ORAL_TABLET | Freq: Three times a day (TID) | ORAL | Status: DC
  Administered 2024-10-22 – 2024-11-02 (×33): 25 mg via ORAL
  Filled 2024-10-22 (×23): qty 3
  Filled 2024-10-22: qty 2.5
  Filled 2024-10-22 (×9): qty 3

## 2024-10-22 MED ORDER — SENNOSIDES-DOCUSATE SODIUM 8.6-50 MG PO TABS
3.0000 | ORAL_TABLET | Freq: Every day | ORAL | Status: DC
  Administered 2024-10-22 – 2024-10-23 (×2): 3 via ORAL
  Filled 2024-10-22 (×2): qty 3

## 2024-10-22 NOTE — Plan of Care (Signed)
  Problem: SCI BOWEL ELIMINATION Goal: RH STG MANAGE BOWEL WITH ASSISTANCE Description: STG Manage Bowel with supervision Assistance. Outcome: Progressing   Problem: SCI BLADDER ELIMINATION Goal: RH STG MANAGE BLADDER WITH ASSISTANCE Description: STG Manage Bladder With supervision Assistance Outcome: Progressing   Problem: RH SKIN INTEGRITY Goal: RH STG SKIN FREE OF INFECTION/BREAKDOWN Description: Manage skin free of infection with supervision assistance Outcome: Progressing   Problem: RH SAFETY Goal: RH STG ADHERE TO SAFETY PRECAUTIONS W/ASSISTANCE/DEVICE Description: STG Adhere to Safety Precautions With Assistance/Device. Outcome: Progressing   Problem: RH PAIN MANAGEMENT Goal: RH STG PAIN MANAGED AT OR BELOW PT'S PAIN GOAL Description: < 4 w/ prns Outcome: Progressing   Problem: RH KNOWLEDGE DEFICIT SCI Goal: RH STG INCREASE KNOWLEDGE OF SELF CARE AFTER SCI Description: Manage increase knowledge of self care after SCI with supervision assistance from wife using educational materials provided Outcome: Progressing   Problem: Education: Goal: Knowledge of General Education information will improve Description: Including pain rating scale, medication(s)/side effects and non-pharmacologic comfort measures Outcome: Progressing   Problem: Health Behavior/Discharge Planning: Goal: Ability to manage health-related needs will improve Outcome: Progressing   Problem: Clinical Measurements: Goal: Ability to maintain clinical measurements within normal limits will improve Outcome: Progressing Goal: Will remain free from infection Outcome: Progressing Goal: Diagnostic test results will improve Outcome: Progressing Goal: Respiratory complications will improve Outcome: Progressing Goal: Cardiovascular complication will be avoided Outcome: Progressing   Problem: Activity: Goal: Risk for activity intolerance will decrease Outcome: Progressing   Problem: Nutrition: Goal:  Adequate nutrition will be maintained Outcome: Progressing   Problem: Coping: Goal: Level of anxiety will decrease Outcome: Progressing   Problem: Elimination: Goal: Will not experience complications related to bowel motility Outcome: Progressing Goal: Will not experience complications related to urinary retention Outcome: Progressing   Problem: Pain Managment: Goal: General experience of comfort will improve and/or be controlled Outcome: Progressing   Problem: Safety: Goal: Ability to remain free from injury will improve Outcome: Progressing   Problem: Skin Integrity: Goal: Risk for impaired skin integrity will decrease Outcome: Progressing

## 2024-10-22 NOTE — Progress Notes (Signed)
 Occupational Therapy Session Note  Patient Details  Name: David King MRN: [id number] Date of Birth: 08/24/1956  Today's Date: 10/22/2024 OT Individual Time: 0815-0930 OT Individual Time Calculation (min): 75 min    Short Term Goals: Week 2:  OT Short Term Goal 1 (Week 2): Patient to complete LB dressing with Mod A OT Short Term Goal 2 (Week 2): Patient to perform toileting with Mod A OT Short Term Goal 3 (Week 2): Patient to perform toilet transfer with Min assist OT Short Term Goal 4 (Week 2): Pt will don/doff TLSO brace with Mod A  Skilled Therapeutic Interventions/Progress Updates:    Pt resting in bed upon arrival. Pt already dressing. OTA assisted pt with donning shoes. Upon standing, pt requested use of BSC. Transfer to [facility] with min A. Pt with medium BM. Max A for toileting tasks. Transfer to EOB and then to w/c with min A. BUE therex on UBE for general conditioning. 4x2 mins level 2 with extended rest breaks. Pt voiced surprise that task was so exhausting/tiring. Pt returned to room and remained in w/c with all needs within reach.   Therapy Documentation Precautions:  Precautions Precautions: Fall, Back Precaution Booklet Issued: Yes (comment) Recall of Precautions/Restrictions: Impaired Precaution/Restrictions Comments: watch BP (orthostatic); RLE foot drop, wound VAC Required Braces or Orthoses: Spinal Brace Spinal Brace: Thoracolumbosacral orthotic, Applied in sitting position Restrictions Weight Bearing Restrictions Per Provider Order: Yes RLE Weight Bearing Per Provider Order: Weight bearing as tolerated Other Position/Activity Restrictions: transfers only on RLE Pain: Pt c/o intermittent shooting pains in RLE but resolves without intervention within 90 secs.    Therapy/Group: Individual Therapy  David King 10/22/2024, 12:20 PM

## 2024-10-22 NOTE — Progress Notes (Addendum)
 PROGRESS NOTE   Subjective/Complaints:  Pt just getting back into room with OT. No new issues although right leg sore this morning given his wound issues, vac being off, etc. Tylenol  still covers pain. Has tingling/burning in feet too which is painful as well. Constipated this morning  ROS: Patient denies fever, rash, sore throat, blurred vision, dizziness, nausea, vomiting, diarrhea, cough, shortness of breath or chest pain, headache, or mood change.    Objective:   No results found.  Recent Labs    10/21/24 0821  WBC 9.4  HGB 9.8*  HCT 31.1*  PLT 238   Recent Labs    10/21/24 0504  NA 140  K 3.7  CL 107  CO2 23  GLUCOSE 97  BUN 23  CREATININE 0.96  CALCIUM  9.0    Intake/Output Summary (Last 24 hours) at 10/22/2024 0948 Last data filed at 10/22/2024 0807 Gross per 24 hour  Intake 720 ml  Output 2225 ml  Net -1505 ml     Wound 10/11/24 1630 Pressure Injury Heel Right Unstageable - Full thickness tissue loss in which the base of the injury is covered by slough (yellow, tan, gray, green or brown) and/or eschar (tan, brown or black) in the wound bed. (Active)    Physical Exam: Vital Signs Blood pressure 114/74, pulse 89, temperature 98.2 F (36.8 C), temperature source Oral, resp. rate 16, height 5' 9 (1.753 m), weight 86.2 kg, SpO2 96%.      Constitutional: No distress . Vital signs reviewed. HEENT: NCAT, EOMI, oral membranes moist Neck: supple Cardiovascular: RRR without murmur. No JVD    Respiratory/Chest: CTA Bilaterally without wheezes or rales. Normal effort    GI/Abdomen: BS +, non-tender, non-distended Ext: no clubbing, cyanosis, or edema Psych: pleasant and cooperative  General: Skin is warm. Wounds presenting still similar to below -vac removed from right lower leg                                   Neurological:     Comments: Patient is alert.     Oriented x 3 and  follows commands. CN exam non-focal. Fair insight and awareness. MMT: BUE 5/5. LLE limited by  pain but appears to be moving all muscle groups. RLE limited by wounds/dressings, etc. Sensory exam normal for light touch and pain except for right foot where there is diminished LT along dorsum and to a lesser extent plantar aspect of foot.  No limb ataxia or cerebellar signs. No abnormal tone appreciated.    Prior neuro assessment is c/w 10/22/2024 exam.   Musc: right heel cord tight- still about  minus 10-deg      Assessment/Plan: 1. Functional deficits which require 3+ hours per day of interdisciplinary therapy in a comprehensive inpatient rehab setting. Physiatrist is providing close team supervision and 24 hour management of active medical problems listed below. Physiatrist and rehab team continue to assess barriers to discharge/monitor patient progress toward functional and medical goals  Care Tool:  Bathing    Body parts bathed by patient: Right arm, Left arm, Chest, Abdomen, Face   Body parts bathed by  helper: Front perineal area, Buttocks, Right upper leg, Left lower leg, Right lower leg, Left upper leg     Bathing assist Assist Level: Total Assistance - Patient < 25%     Upper Body Dressing/Undressing Upper body dressing   What is the patient wearing?: Pull over shirt    Upper body assist Assist Level: Supervision/Verbal cueing    Lower Body Dressing/Undressing Lower body dressing      What is the patient wearing?: Pants, Incontinence brief     Lower body assist Assist for lower body dressing: Maximal Assistance - Patient 25 - 49%     Toileting Toileting    Toileting assist Assist for toileting: Maximal Assistance - Patient 25 - 49%     Transfers Chair/bed transfer  Transfers assist  Chair/bed transfer activity did not occur: Safety/medical concerns  Chair/bed transfer assist level: 2 Helpers     Locomotion Ambulation   Ambulation assist   Ambulation  activity did not occur: Safety/medical concerns  Assist level: 2 helpers Assistive device: Walker-rolling Max distance: 15   Walk 10 feet activity   Assist  Walk 10 feet activity did not occur: Safety/medical concerns  Assist level: 2 helpers Assistive device: Walker-rolling   Walk 50 feet activity   Assist Walk 50 feet with 2 turns activity did not occur: Safety/medical concerns         Walk 150 feet activity   Assist Walk 150 feet activity did not occur: Safety/medical concerns         Walk 10 feet on uneven surface  activity   Assist Walk 10 feet on uneven surfaces activity did not occur: Safety/medical concerns         Wheelchair     Assist Is the patient using a wheelchair?: Yes Type of Wheelchair: Manual Wheelchair activity did not occur: Safety/medical concerns         Wheelchair 50 feet with 2 turns activity    Assist    Wheelchair 50 feet with 2 turns activity did not occur: Safety/medical concerns       Wheelchair 150 feet activity     Assist  Wheelchair 150 feet activity did not occur: Safety/medical concerns       Blood pressure 114/74, pulse 89, temperature 98.2 F (36.8 C), temperature source Oral, resp. rate 16, height 5' 9 (1.753 m), weight 86.2 kg, SpO2 96%.  Medical Problem List and Plan: 1. Functional deficits secondary to PAD/compartment syndrome/right leg ischemia/status post thrombectomy.  Status post right leg anterior compartment fasciotomy 9/26 with multiple washouts as well as excisional debridement/wound VAC.  Patient is completing a course of doxycycline             -patient may not yet shower             -ELOS/Goals: 11/11,  supervision goals  -ordered PRAFO to help stretch right heel cord  -Continue CIR therapies including PT, OT, and SLP  -pt will need an AFO but given his ongoing wound issues, he's not ready for it quite yet.  2.  Antithrombotics: -DVT/anticoagulation:  Pharmaceutical: now on  eliquis             -antiplatelet therapy: Aspirin  81 mg daily 3. Pain Management: Lyrica  150 mg 3 times daily,  tylenol .    10/22 doing well with tylenol  alone. Off all narcotics  10/24 pt doesn't want to increase lyrica  any further at this point (not too much higher it can go) 4. Mood/Behavior/Sleep: Cymbalta  30 mg nightly, Celexa  20  mg daily             -antipsychotic agents: N/A 5. Neuropsych/cognition: This patient is capable of making decisions on his own behalf. 6. Skin/Wound Care: Routine skin checks             -vac removed-->Vashe soaked gauze over wound,dry gauze, kerlix, ACE --daily change             -local care and pressure relief as needed to multiple other wounds. doxycycline             -prevalon boot/pressure relief, right heel wound   7. Fluids/Electrolytes/Nutrition: Routine and analysis with follow-up chemistries 8. lumbar radiculopathy/large disc herniation spinal stenosis.  Status post L4-5 transforaminal interbody fusion/posterior lateral fusion insertion interbody device L4-5 decompression 09/08/2024 per Dr. Beuford.  Back brace when out of bed    -10/24--Dr. Letitia revised TLSO to LSO which he needs to wear when standing or ambulating   -back precautions ongoing 9.  Acute blood loss anemia.  Follow-up CBC  10/14- Pt has ~ 150-200cc out of VAC- not sure since when? Hb down 1/2 unit from 8.9 to 8.4- will recheck Thursday  10/15- Hb will check in AM  10/17- Hb 9.0 10.  AKI.  Resolved after IV hydration. 11.  Hyperlipidemia.  Crestor  12.  COPD/remote tobacco use.  Monitor oxygen  saturations every shift  10/15- was on O2 this AM- per nursing from overnight only- but Sats 89-91%- getting CXR and ABG to see where he is- and getting sepsis work up.   10/16- started on Unasyn and will start Mucinex for coarse cough-  13.  History of prostate cancer/urinary retention.  History of status post prostatectomy.  External catheter currently in place.  Continue Flomax  0.4 mg  daily  10/14- is voiding- but had urinary retention last time- will check a few bladder scans.   10/15 - pt needing in/out caths- had >1400cc in bladder- has cathing protocol now  10/16-10/17 has orders to bladder scan and in/out if volumes >350cc- requiring caths sometimes  10/18 may need to cath more frequently if he's having volumes >1000cc  10/19 cath volumes a little less yesterday--continue with current mgt  10/24 still having intermittent retention   -continue low dose urecholine at high volumes   -increase urecholine to 25mg  tid, continue flomax    -up to EOB/toilet to empty 14.  History of detached retina left eye.  Follow-up outpatient ophthalmology. 15. Severe constipation  10/14- LBM at least 4-5 days ago per documentation- pt doesn't remember when went last- will get KUB- and order Sorbitol  30cc- and SSE if no results- having abd pain.   10/16- Pt had 1 liquid stool- but otherwise, no other stools in 6-7 days- last KUB shows on 10/14 was full of stool- will give Sorbitol  since likely has hard stool that liquid getting by- and SSE- if that doesn't work, will ask nursing to do Mg citrate.   10/17- had massive BM  per pt- per chart 1 large, 1 medium-   10/18 smear yesterday. Continue senna-s and increase miralax  to bid.  10/19 two small bm's today  10/24 had large T3 BM today but still feels constipated. Will increase senna-s to 3 tabs at bedtime 16.  Hypokalemia  10/14- K+ 3.2- will replete KCL 40 Meq x1.  17. Low grade temp in setting of delirium  10/15- will get sepsis work up, ammonia level and ABG  10/17- has HCAP- Per #21 18. Cirrhosis on last Abd imaging  10/15-  will get Ammonia as well esp with new jerking/tremors.   10/16- is actually shivering this AM, but isn't cold- Ammonia 16- not the cause of Sx's.   10/20- ALT is 47- slightly up but AST down to 37- can con't tylenol  as ordered 19. Infrarenal aortic aneurysm with mural thrombus- 6.8 cm on last imaging   20.  Orthostatic hypotension  10/16- On Flomax , however No BP meds that I can reduce- will check with therapy- if dropping more, will add Midodrine- ordering ACE wrap on LLE  -addendum- adding Midodrine 5 mg TID starting at 630 am  10/19 bp's a little more robust over weekend thus far. No changes today  10/22-24 improved on midodrine 21. HCAP  10/16- on Unasyn-  will con't for 5-6 days- monitor WBC- down to 12.7 from 13.2 last evening. Shivering could be due to breaking a temp>>??  10/17- no tremors/shivering- will recheck CBC in AM- and make sure on right ABX-  and CMP due to LFTS/renal check on IV ABX- Added Albuterol  scheduled QID due to wheezing and bad COPD     10/20- WBC down to 8.2-   Unasyn completed        LOS: 11 days A FACE TO FACE EVALUATION WAS PERFORMED  Arthea ONEIDA Gunther 10/22/2024, 9:48 AM

## 2024-10-22 NOTE — Plan of Care (Signed)
  Problem: SCI BOWEL ELIMINATION Goal: RH STG MANAGE BOWEL WITH ASSISTANCE Description: STG Manage Bowel with supervision Assistance. Outcome: Progressing   Problem: SCI BLADDER ELIMINATION Goal: RH STG MANAGE BLADDER WITH ASSISTANCE Description: STG Manage Bladder With supervision Assistance Outcome: Progressing   Problem: RH SKIN INTEGRITY Goal: RH STG SKIN FREE OF INFECTION/BREAKDOWN Description: Manage skin free of infection with supervision assistance Outcome: Progressing   Problem: RH SAFETY Goal: RH STG ADHERE TO SAFETY PRECAUTIONS W/ASSISTANCE/DEVICE Description: STG Adhere to Safety Precautions With Assistance/Device. Outcome: Progressing   Problem: RH PAIN MANAGEMENT Goal: RH STG PAIN MANAGED AT OR BELOW PT'S PAIN GOAL Description: < 4 w/ prns Outcome: Progressing   Problem: RH KNOWLEDGE DEFICIT SCI Goal: RH STG INCREASE KNOWLEDGE OF SELF CARE AFTER SCI Description: Manage increase knowledge of self care after SCI with supervision assistance from wife using educational materials provided Outcome: Progressing

## 2024-10-22 NOTE — Progress Notes (Signed)
 RN notified provider of increased edema and pain to RLE; MD informed this RN to elevate extremity and increase compression of ACE wrap.

## 2024-10-22 NOTE — Progress Notes (Signed)
 Physical Therapy Session Note  Patient Details  Name: David King MRN: [id number] Date of Birth: [birth year]  Today's Date: 10/22/2024 PT Individual Time: 1300-1400 PT Individual Time Calculation (min): 60 min   Short Term Goals: Week 2:  PT Short Term Goal 1 (Week 2): pt will tolerate standing >60 sec without orthostatic hypotension PT Short Term Goal 2 (Week 2): Pt will ambulate 30 ft or >with LRAD PT Short Term Goal 3 (Week 2): Pt will initiate transfer training with RW  Skilled Therapeutic Interventions/Progress Updates:    Pt seated in w/c on arrival and agreeable to therapy. Pt reports 6/10 pain at start of session, down to 4/10 by end of session, declines intervention other than exercise.   Session focused on gait training with min a and +2 w/c follow. Pt demoes step to and steppage gait, would benefit from AFO, however not attempted this time d/t LE wounds. Pt walked x 25 ft, 2 x 38 ft, x 66 ft.   Pt used Nustep 2 x 5 min at level 3 for global strength and endurance. Pt returned to room and was handed off to nsg for toileting.    Therapy Documentation Precautions:  Precautions Precautions: Fall, Back Precaution Booklet Issued: Yes (comment) Recall of Precautions/Restrictions: Impaired Precaution/Restrictions Comments: watch BP (orthostatic); RLE foot drop, wound VAC Required Braces or Orthoses: Spinal Brace Spinal Brace: Thoracolumbosacral orthotic, Applied in sitting position Restrictions Weight Bearing Restrictions Per Provider Order: Yes RLE Weight Bearing Per Provider Order: Weight bearing as tolerated Other Position/Activity Restrictions: transfers only on RLE General:    Therapy/Group: Individual Therapy  [name] 10/22/2024, 1:47 PM

## 2024-10-22 NOTE — Progress Notes (Signed)
 Physical Therapy Session Note  Patient Details  Name: David King MRN: [id number] Date of Birth: 06-Jul-1956  Today's Date: 10/22/2024 PT Individual Time: 1045-1200 PT Individual Time Calculation (min): 75 min   Short Term Goals: Week 2:  PT Short Term Goal 1 (Week 2): pt will tolerate standing >60 sec without orthostatic hypotension PT Short Term Goal 2 (Week 2): Pt will ambulate 30 ft or >with LRAD PT Short Term Goal 3 (Week 2): Pt will initiate transfer training with RW  Skilled Therapeutic Interventions/Progress Updates: Pt presents sitting in TIS and agreeable to therapy although c/o dizziness in sitting and pain down R LE.  Pt given rest breaks throughout session for pain management.  Thigh high TED donned to LLEalthough required standing to complete pulling up to thigh.  BP measured at 92/71 in sitting w/ 4/10 dizziness..  Pt transfers throughout session w/ CGA, split UE placement.  Pt unable to get BP in standing.  Pt performed LE there ex to increase circulation and BP.  Pt performed AP LLE, LAQ, hip flexion 3 x 10.  Pt performed standing marching 3 x 10, cone stacking/unstacking w/ B UES and then transitioned to standing on Airex.  Pt required extended rest breaks 2/2 fatigue.  Pt returned to room and remained sitting in TIS w/ all needs in reach.     Therapy Documentation Precautions:  Precautions Precautions: Fall, Back Precaution Booklet Issued: Yes (comment) Recall of Precautions/Restrictions: Impaired Precaution/Restrictions Comments: watch BP (orthostatic); RLE foot drop, wound VAC Required Braces or Orthoses: Spinal Brace Spinal Brace: Thoracolumbosacral orthotic, Applied in sitting position Restrictions Weight Bearing Restrictions Per Provider Order: Yes RLE Weight Bearing Per Provider Order: Weight bearing as tolerated Other Position/Activity Restrictions: transfers only on RLE General:   Vital Signs:   Pain:6/10, increased to 9/10 w/ activity. Pain  Assessment Pain Scale: 0-10 Pain Score: 8  Pain Type: Acute pain;Surgical pain Pain Location: Leg Pain Orientation: Right Pain Radiating Towards: foot Pain Descriptors / Indicators: Constant Pain Frequency: Constant Pain Onset: On-going Pain Intervention(s): Medication (See eMAR)    Therapy/Group: Individual Therapy  David King 10/22/2024, 12:00 PM

## 2024-10-23 DIAGNOSIS — K5901 Slow transit constipation: Secondary | ICD-10-CM

## 2024-10-23 DIAGNOSIS — I951 Orthostatic hypotension: Secondary | ICD-10-CM | POA: Diagnosis not present

## 2024-10-23 DIAGNOSIS — T79A21D Traumatic compartment syndrome of right lower extremity, subsequent encounter: Secondary | ICD-10-CM | POA: Diagnosis not present

## 2024-10-23 NOTE — Plan of Care (Signed)
  Problem: Activity: Goal: Risk for activity intolerance will decrease Outcome: Progressing   Problem: Nutrition: Goal: Adequate nutrition will be maintained Outcome: Progressing   Problem: Elimination: Goal: Will not experience complications related to bowel motility Outcome: Progressing Goal: Will not experience complications related to urinary retention Outcome: Progressing   Problem: Pain Managment: Goal: General experience of comfort will improve and/or be controlled Outcome: Progressing   Problem: Safety: Goal: Ability to remain free from injury will improve Outcome: Progressing   Problem: Skin Integrity: Goal: Risk for impaired skin integrity will decrease Outcome: Progressing   Problem: SCI BOWEL ELIMINATION Goal: RH STG MANAGE BOWEL WITH ASSISTANCE Description: STG Manage Bowel with supervision Assistance. Outcome: Progressing   Problem: SCI BLADDER ELIMINATION Goal: RH STG MANAGE BLADDER WITH ASSISTANCE Description: STG Manage Bladder With supervision Assistance Outcome: Progressing   Problem: RH SKIN INTEGRITY Goal: RH STG SKIN FREE OF INFECTION/BREAKDOWN Description: Manage skin free of infection with supervision assistance Outcome: Progressing   Problem: RH SAFETY Goal: RH STG ADHERE TO SAFETY PRECAUTIONS W/ASSISTANCE/DEVICE Description: STG Adhere to Safety Precautions With Assistance/Device. Outcome: Progressing   Problem: RH PAIN MANAGEMENT Goal: RH STG PAIN MANAGED AT OR BELOW PT'S PAIN GOAL Description: < 4 w/ prns Outcome: Progressing

## 2024-10-23 NOTE — Progress Notes (Signed)
 Physical Therapy Session Note  Patient Details  Name: David King MRN: [id number] Date of Birth: January 02, 1956  Today's Date: 10/23/2024 PT Individual Time: 1351-1446 PT Individual Time Calculation (min): 55 min   Short Term Goals: Week 2:  PT Short Term Goal 1 (Week 2): pt will tolerate standing >60 sec without orthostatic hypotension PT Short Term Goal 2 (Week 2): Pt will ambulate 30 ft or >with LRAD PT Short Term Goal 3 (Week 2): Pt will initiate transfer training with RW  Skilled Therapeutic Interventions/Progress Updates: Patient sitting in recliner on entrance to room. Patient alert and agreeable to PT session.   Patient reported 8/10 pain (premedicated) and reported decrease in pain following ambulation.   Therapeutic Activity: Transfers: Pt performed sit<>stand transfers throughout session with supervision/CGA.  Pt ambulated 17', 57', 78', and 47' around nsg/day room loop with close supervision and PTA towing TIS for rest. Pt cued to increase BOS per narrow presentation. Pt performed in order to increase tolerance to household ambulatory distances.   Therapeutic Exercise: Pt performed the following exercises with therapist providing the described cuing and facilitation for improvement. - LAQ with 8lb ankle weight donned B LE's 1st round x 10 (2 x 5 on R LE due to decreased strength). PTA provided manual resistance only on R LE due to wound on lateral leg (min resistance). Pt required rest breaks between.  Patient sitting in recliner at end of session with brakes locked, and all needs within reach.      Therapy Documentation Precautions:  Precautions Precautions: Fall, Back Precaution Booklet Issued: Yes (comment) Recall of Precautions/Restrictions: Impaired Precaution/Restrictions Comments: watch BP (orthostatic); RLE foot drop, wound VAC Required Braces or Orthoses: Spinal Brace Spinal Brace: Thoracolumbosacral orthotic, Applied in sitting position Restrictions Weight  Bearing Restrictions Per Provider Order: No RLE Weight Bearing Per Provider Order: Weight bearing as tolerated Other Position/Activity Restrictions: transfers only on RLE  Therapy/Group: Individual Therapy  Kavion Mancinas PTA  10/23/2024, 3:26 PM

## 2024-10-23 NOTE — Progress Notes (Signed)
 PROGRESS NOTE   Subjective/Complaints:  Pt doing well, slept well, pain doing much better today. LBM 1hr ago. Urinating fine per pt but looks like still having large volume caths. No other complaints or concerns.   ROS: as per HPI. Denies CP, SOB, abd pain, N/V/D/C, or any other complaints at this time.     Objective:   No results found.  Recent Labs    10/21/24 0821  WBC 9.4  HGB 9.8*  HCT 31.1*  PLT 238   Recent Labs    10/21/24 0504  NA 140  K 3.7  CL 107  CO2 23  GLUCOSE 97  BUN 23  CREATININE 0.96  CALCIUM  9.0    Intake/Output Summary (Last 24 hours) at 10/23/2024 1244 Last data filed at 10/23/2024 0843 Gross per 24 hour  Intake 1071 ml  Output 1870 ml  Net -799 ml     Wound 10/11/24 1630 Pressure Injury Heel Right Unstageable - Full thickness tissue loss in which the base of the injury is covered by slough (yellow, tan, gray, green or brown) and/or eschar (tan, brown or black) in the wound bed. (Active)     Wound 10/22/24 0945 Pressure Injury Foot Right Stage 1 -  Intact skin with non-blanchable redness of a localized area usually over a bony prominence. (Active)    Physical Exam: Vital Signs Blood pressure 102/67, pulse 94, temperature 98.2 F (36.8 C), temperature source Oral, resp. rate 16, height 5' 9 (1.753 m), weight 86.2 kg, SpO2 95%.    Constitutional: No distress . Vital signs reviewed. Pt comfortable.  HEENT: NCAT, EOMI, oral membranes moist Neck: supple Cardiovascular: RRR without murmur. No JVD    Respiratory/Chest: CTA Bilaterally without wheezes or rales. Normal effort    GI/Abdomen: BS +, non-tender, non-distended, soft Ext: no clubbing, cyanosis, or edema although RLE wrapped so limited exam Psych: pleasant and cooperative   PRIOR EXAMS: General: Skin is warm. Wounds presenting still similar to below -vac removed from right lower leg                                    Neurological:     Comments: Patient is alert.     Oriented x 3 and follows commands. CN exam non-focal. Fair insight and awareness. MMT: BUE 5/5. LLE limited by  pain but appears to be moving all muscle groups. RLE limited by wounds/dressings, etc. Sensory exam normal for light touch and pain except for right foot where there is diminished LT along dorsum and to a lesser extent plantar aspect of foot.  No limb ataxia or cerebellar signs. No abnormal tone appreciated.     Musc: right heel cord tight- still about  minus 10-deg      Assessment/Plan: 1. Functional deficits which require 3+ hours per day of interdisciplinary therapy in a comprehensive inpatient rehab setting. Physiatrist is providing close team supervision and 24 hour management of active medical problems listed below. Physiatrist and rehab team continue to assess barriers to discharge/monitor patient progress toward functional and medical goals  Care Tool:  Bathing    Body parts  bathed by patient: Right arm, Left arm, Chest, Abdomen, Face   Body parts bathed by helper: Front perineal area, Buttocks, Right upper leg, Left lower leg, Right lower leg, Left upper leg     Bathing assist Assist Level: Total Assistance - Patient < 25%     Upper Body Dressing/Undressing Upper body dressing   What is the patient wearing?: Pull over shirt    Upper body assist Assist Level: Supervision/Verbal cueing    Lower Body Dressing/Undressing Lower body dressing      What is the patient wearing?: Pants, Incontinence brief     Lower body assist Assist for lower body dressing: Maximal Assistance - Patient 25 - 49%     Toileting Toileting    Toileting assist Assist for toileting: Maximal Assistance - Patient 25 - 49%     Transfers Chair/bed transfer  Transfers assist  Chair/bed transfer activity did not occur: Safety/medical concerns  Chair/bed transfer assist level: 2 Helpers      Locomotion Ambulation   Ambulation assist   Ambulation activity did not occur: Safety/medical concerns  Assist level: 2 helpers Assistive device: Walker-rolling Max distance: 15   Walk 10 feet activity   Assist  Walk 10 feet activity did not occur: Safety/medical concerns  Assist level: 2 helpers Assistive device: Walker-rolling   Walk 50 feet activity   Assist Walk 50 feet with 2 turns activity did not occur: Safety/medical concerns         Walk 150 feet activity   Assist Walk 150 feet activity did not occur: Safety/medical concerns         Walk 10 feet on uneven surface  activity   Assist Walk 10 feet on uneven surfaces activity did not occur: Safety/medical concerns         Wheelchair     Assist Is the patient using a wheelchair?: Yes Type of Wheelchair: Manual Wheelchair activity did not occur: Safety/medical concerns         Wheelchair 50 feet with 2 turns activity    Assist    Wheelchair 50 feet with 2 turns activity did not occur: Safety/medical concerns       Wheelchair 150 feet activity     Assist  Wheelchair 150 feet activity did not occur: Safety/medical concerns       Blood pressure 102/67, pulse 94, temperature 98.2 F (36.8 C), temperature source Oral, resp. rate 16, height 5' 9 (1.753 m), weight 86.2 kg, SpO2 95%.  Medical Problem List and Plan: 1. Functional deficits secondary to PAD/compartment syndrome/right leg ischemia/status post thrombectomy.  Status post right leg anterior compartment fasciotomy 9/26 with multiple washouts as well as excisional debridement/wound VAC.  Patient is completing a course of doxycycline             -patient may not yet shower             -ELOS/Goals: 11/11,  supervision goals  -ordered PRAFO to help stretch right heel cord  -Continue CIR therapies including PT, OT, and SLP  -pt will need an AFO but given his ongoing wound issues, he's not ready for it quite yet.  2.   Antithrombotics: -DVT/anticoagulation:  Pharmaceutical: now on eliquis 5mg  BID             -antiplatelet therapy: Aspirin  81 mg daily 3. Pain Management: Lyrica  150 mg 3 times daily,  tylenol  prn.    10/22 doing well with tylenol  alone. Off all narcotics (prefers this) 10/24 pt doesn't want to increase lyrica   any further at this point (not too much higher it can go) 4. Mood/Behavior/Sleep: Cymbalta  30 mg nightly, Celexa  20 mg daily             -antipsychotic agents: N/A  -ambien  PRN 5. Neuropsych/cognition: This patient is capable of making decisions on his own behalf. 6. Skin/Wound Care: Routine skin checks -vac removed-->Vashe soaked gauze over wound,dry gauze, kerlix, ACE --daily change -local care and pressure relief as needed to multiple other wounds. On doxycycline             -prevalon boot/pressure relief, right heel wound   7. Fluids/Electrolytes/Nutrition: Routine and analysis with follow-up chemistries, continue vitamins/supplements.   8. Lumbar radiculopathy/large disc herniation spinal stenosis.  Status post L4-5 transforaminal interbody fusion/posterior lateral fusion insertion interbody device L4-5 decompression 09/08/2024 per Dr. Beuford.  Back brace when out of bed  -10/24--Dr. Letitia revised TLSO to LSO which he needs to wear when standing or ambulating   -back precautions ongoing 9.  Acute blood loss anemia.  Follow-up CBC 10/14- Pt has ~ 150-200cc out of VAC- not sure since when? Hb down 1/2 unit from 8.9 to 8.4- will recheck Thursday  10/15- Hb will check in AM  10/17- Hb 9.0 10.  AKI.  Resolved after IV hydration. 11.  Hyperlipidemia.  Crestor  20mg  daily 12.  COPD/remote tobacco use.  Monitor oxygen  saturations every shift 10/15- was on O2 this AM- per nursing from overnight only- but Sats 89-91%- getting CXR and ABG to see where he is- and getting sepsis work up.   10/16- started on Unasyn and will start Mucinex for coarse cough-   13.  History of prostate  cancer/urinary retention.  History of status post prostatectomy.  External catheter currently in place.  Continue Flomax  0.4 mg daily 10/14- is voiding- but had urinary retention last time- will check a few bladder scans.  10/15 - pt needing in/out caths- had >1400cc in bladder- has cathing protocol now 10/16-10/17 has orders to bladder scan and in/out if volumes >350cc- requiring caths sometimes  10/18 may need to cath more frequently if he's having volumes >1000cc  10/19 cath volumes a little less yesterday--continue with current mgt  10/24 still having intermittent retention   -continue low dose urecholine at high volumes   -increase urecholine to 25mg  tid, continue flomax    -up to EOB/toilet to empty 14.  History of detached retina left eye.  Follow-up outpatient ophthalmology. 15. Severe constipation 10/14- LBM at least 4-5 days ago per documentation- pt doesn't remember when went last- will get KUB- and order Sorbitol  30cc- and SSE if no results- having abd pain.  10/16- Pt had 1 liquid stool- but otherwise, no other stools in 6-7 days- last KUB shows on 10/14 was full of stool- will give Sorbitol  since likely has hard stool that liquid getting by- and SSE- if that doesn't work, will ask nursing to do Mg citrate.   10/17- had massive BM  per pt- per chart 1 large, 1 medium-   10/18 smear yesterday. Continue senna-s and increase miralax  to bid.  10/19 two small bm's today 10/24 had large T3 BM today but still feels constipated. Will increase senna-s to 3 tabs at bedtime -10/23/24 LBM this morning, type 5; monitor current regimen.  16.  Hypokalemia  10/14- K+ 3.2- will replete KCL 40 Meq x1. -- fine on 10/23 17. Low grade temp in setting of delirium  10/15- will get sepsis work up, ammonia level and ABG  10/17- has HCAP-  Per #21 18. Cirrhosis on last Abd imaging  10/15- will get Ammonia as well esp with new jerking/tremors.  10/16- is actually shivering this AM, but isn't cold- Ammonia  16- not the cause of Sx's.   10/20- ALT is 47- slightly up but AST down to 37- can con't tylenol  as ordered  19. Infrarenal aortic aneurysm with mural thrombus- 6.8 cm on last imaging   20. Orthostatic hypotension 10/16- On Flomax , however No BP meds that I can reduce- will check with therapy- if dropping more, will add Midodrine- ordering ACE wrap on LLE  -addendum- adding Midodrine 5 mg TID starting at 630 am  10/19 bp's a little more robust over weekend thus far. No changes today  10/22-24 improved on midodrine  -10/23/24 BPs still soft at times, but stable, and pt doing well. Monitor.  Vitals:   10/19/24 2043 10/20/24 0508 10/20/24 0643 10/20/24 1944  BP: 108/75 107/86 114/84 106/75   10/21/24 0527 10/21/24 1548 10/21/24 2016 10/22/24 0532  BP: 106/73 105/71 109/78 114/74   10/22/24 1603 10/22/24 1722 10/22/24 1929 10/23/24 0509  BP: (!) 82/63 102/72 90/73 102/67    21. HCAP 10/16- on Unasyn-  will con't for 5-6 days- monitor WBC- down to 12.7 from 13.2 last evening. Shivering could be due to breaking a temp>>?? 10/17- no tremors/shivering- will recheck CBC in AM- and make sure on right ABX-  and CMP due to LFTS/renal check on IV ABX- Added Albuterol  scheduled QID due to wheezing and bad COPD   10/20- WBC down to 8.2-   Unasyn completed        LOS: 12 days A FACE TO FACE EVALUATION WAS PERFORMED  [address] 10/23/2024, 12:44 PM

## 2024-10-24 DIAGNOSIS — T79A21D Traumatic compartment syndrome of right lower extremity, subsequent encounter: Secondary | ICD-10-CM | POA: Diagnosis not present

## 2024-10-24 DIAGNOSIS — R339 Retention of urine, unspecified: Secondary | ICD-10-CM | POA: Diagnosis not present

## 2024-10-24 DIAGNOSIS — K5901 Slow transit constipation: Secondary | ICD-10-CM | POA: Diagnosis not present

## 2024-10-24 DIAGNOSIS — I951 Orthostatic hypotension: Secondary | ICD-10-CM | POA: Diagnosis not present

## 2024-10-24 MED ORDER — SODIUM CHLORIDE 0.9% FLUSH: 10.0000 mL | INTRAVENOUS | Status: DC | PRN

## 2024-10-24 MED ORDER — SENNOSIDES-DOCUSATE SODIUM 8.6-50 MG PO TABS
1.0000 | ORAL_TABLET | Freq: Every day | ORAL | Status: DC
  Administered 2024-10-24 – 2024-11-02 (×10): 1 via ORAL
  Filled 2024-10-24 (×10): qty 1

## 2024-10-24 MED ORDER — SODIUM CHLORIDE 0.9% FLUSH
10.0000 mL | Freq: Two times a day (BID) | INTRAVENOUS | Status: DC
  Administered 2024-10-26 – 2024-11-04 (×17): 10 mL

## 2024-10-24 MED ORDER — POLYETHYLENE GLYCOL 3350 17 G PO PACK
17.0000 g | PACK | Freq: Every day | ORAL | Status: DC | PRN
  Administered 2024-10-29: 17 g via ORAL
  Filled 2024-10-24: qty 1

## 2024-10-24 NOTE — Progress Notes (Signed)
 Physical Therapy Session Note  Patient Details  Name: David King MRN: [id number] Date of Birth: February 20, 1956  Today's Date: 10/24/2024 PT Individual Time: 1300-1410  PT Individual Time Calculation (min): 70 min  Short Term Goals: Week 2:  PT Short Term Goal 1 (Week 2): pt will tolerate standing >60 sec without orthostatic hypotension PT Short Term Goal 2 (Week 2): Pt will ambulate 30 ft or >with LRAD PT Short Term Goal 3 (Week 2): Pt will initiate transfer training with RW  Skilled Therapeutic Interventions/Progress Updates:  Chart reviewed and pt agreeable to therapy. Pt received seated in WC with 8/10 c/o pain in R foot. Pt also reporting that pain is constant through the day unlike previous pain that would ease up through the day. Session focused on functional transfers, balance, and ambulation to promote safe home mobility and access. RN entered room at start of session for medication management. Pt taken to hallway for time management. Pt session focused on blocked practice of amb distance with pt completing multiple rounds of amb for distances ranging 20-135ft using CGA + RW and fading to S + RW. Pt noted to have good safety judgement related to endurance and need for rest break. Pt displayed antalgic gait consistent with R foot pain. Pt completed bed mobility including log roll with light CGA and good awareness of safety precautions.  At end of session, pt was left semi-reclined in bed with alarm engaged, nurse call bell and all needs in reach.     Therapy Documentation Precautions:  Precautions Precautions: Fall, Back Precaution Booklet Issued: Yes (comment) Recall of Precautions/Restrictions: Impaired Precaution/Restrictions Comments: watch BP (orthostatic); RLE foot drop, wound VAC Required Braces or Orthoses: Spinal Brace Spinal Brace: Thoracolumbosacral orthotic, Applied in sitting position Restrictions Weight Bearing Restrictions Per Provider Order: No RLE Weight Bearing  Per Provider Order: Weight bearing as tolerated Other Position/Activity Restrictions: transfers only on RLE General:      Therapy/Group: Individual Therapy   Warrick KANDICE Raspberry 10/24/2024, 4:15 PM

## 2024-10-24 NOTE — Progress Notes (Signed)
 Occupational Therapy Session Note  Patient Details  Name: David King MRN: [id number] Date of Birth: [birth year]  Today's Date: 10/24/2024 OT Individual Time: 0900-1045 OT Individual Time Calculation (min): 105 min    Short Term Goals: Week 1:  OT Short Term Goal 1 (Week 1): Patient to complete LB dressing with Mod A OT Short Term Goal 1 - Progress (Week 1): Progressing toward goal OT Short Term Goal 2 (Week 1): Patient to perform toileting with Mod A OT Short Term Goal 2 - Progress (Week 1): Progressing toward goal OT Short Term Goal 3 (Week 1): Patient to perform toilet transfer with Min assist OT Short Term Goal 3 - Progress (Week 1): Progressing toward goal OT Short Term Goal 4 (Week 1): Pt will don/doff TLSO brace with Mod A OT Short Term Goal 4 - Progress (Week 1): Progressing toward goal  Skilled Therapeutic Interventions/Progress Updates:   Initial Encounter:  The patient was resting in bed watching television at the time of arrival. The patient reported a pain response of a 7 on a 0 to 10 for R foot, he went on to say that nursing had provided medication to address his pain. The pt reported not resting well during the night secondary to being awaken by staff.   Dressing Exercise:  The pt's compression hose  and socks were donned at Dep LOF. The pt was able to come from supine in bed to EOB with MinA to CGA with the head of the bed elevated. The pt's shirt was donned with initial cues for donning his head first and then his arm for adherence to back precaution. The pt back brace was donned EOB at MinA. The pt was able to donn his pants  over his feet with ModA, he was able to donn his pants up to the thigh incorporating the reacher with initial cues and demonstration for opposite arm opposite leg for > ROM at MinA . The pt was MaxA for  donning his shoes. The pt was able to come from sit to stand  with Mod to MinA using the RW for coming into stand for donning his pant over his hips  and bottom at  [facility].   Transfers: The pt was able to transfer from EOB to the w/c with MinA using the RW. The pt was transported to the sink area and was able to brush his teeth and apply his hearing aides with s/uA   UB Exercise: The pt went on to complete core strengthening using a 3lb dumb bell, 2 sets of 10 for bicep curls and  horizontal abduction.  The pt went on to complete a table to activity using a pincher grasp for placement of peg in a board replicating  2 patterns. At the end of the session, the pt reported having spasms with his R leg, but indicated that he doesn't like taking medication.  Prior to me leaving the room , I  informed the patient that I would make nursing aware  of his pain response and  I placed the call light and bedside table within reach with all additional needs addressed.   Therapy Documentation Precautions:  Precautions Precautions: Fall, Back Precaution Booklet Issued: Yes (comment) Recall of Precautions/Restrictions: Impaired Precaution/Restrictions Comments: watch BP (orthostatic); RLE foot drop, wound VAC Required Braces or Orthoses: Spinal Brace Spinal Brace: Thoracolumbosacral orthotic, Applied in sitting position Restrictions Weight Bearing Restrictions Per Provider Order: No RLE Weight Bearing Per Provider Order: Weight bearing as tolerated Other Position/Activity Restrictions:  transfers only on RLE Therapy/Group: Individual Therapy  Elvera JONETTA Mace 10/24/2024, 12:35 PM

## 2024-10-24 NOTE — Plan of Care (Signed)
  Problem: SCI BOWEL ELIMINATION Goal: RH STG MANAGE BOWEL WITH ASSISTANCE Description: STG Manage Bowel with supervision Assistance. Outcome: Progressing   Problem: SCI BLADDER ELIMINATION Goal: RH STG MANAGE BLADDER WITH ASSISTANCE Description: STG Manage Bladder With supervision Assistance Outcome: Progressing   Problem: RH SKIN INTEGRITY Goal: RH STG SKIN FREE OF INFECTION/BREAKDOWN Description: Manage skin free of infection with supervision assistance Outcome: Progressing   Problem: RH SAFETY Goal: RH STG ADHERE TO SAFETY PRECAUTIONS W/ASSISTANCE/DEVICE Description: STG Adhere to Safety Precautions With Assistance/Device. Outcome: Progressing   Problem: RH PAIN MANAGEMENT Goal: RH STG PAIN MANAGED AT OR BELOW PT'S PAIN GOAL Description: < 4 w/ prns Outcome: Progressing   Problem: RH KNOWLEDGE DEFICIT SCI Goal: RH STG INCREASE KNOWLEDGE OF SELF CARE AFTER SCI Description: Manage increase knowledge of self care after SCI with supervision assistance from wife using educational materials provided Outcome: Progressing   Problem: Education: Goal: Knowledge of General Education information will improve Description: Including pain rating scale, medication(s)/side effects and non-pharmacologic comfort measures Outcome: Progressing   Problem: Health Behavior/Discharge Planning: Goal: Ability to manage health-related needs will improve Outcome: Progressing   Problem: Clinical Measurements: Goal: Ability to maintain clinical measurements within normal limits will improve Outcome: Progressing Goal: Will remain free from infection Outcome: Progressing Goal: Diagnostic test results will improve Outcome: Progressing Goal: Respiratory complications will improve Outcome: Progressing Goal: Cardiovascular complication will be avoided Outcome: Progressing   Problem: Activity: Goal: Risk for activity intolerance will decrease Outcome: Progressing   Problem: Nutrition: Goal:  Adequate nutrition will be maintained Outcome: Progressing   Problem: Coping: Goal: Level of anxiety will decrease Outcome: Progressing   Problem: Elimination: Goal: Will not experience complications related to bowel motility Outcome: Progressing Goal: Will not experience complications related to urinary retention Outcome: Progressing   Problem: Pain Managment: Goal: General experience of comfort will improve and/or be controlled Outcome: Progressing   Problem: Safety: Goal: Ability to remain free from injury will improve Outcome: Progressing   Problem: Skin Integrity: Goal: Risk for impaired skin integrity will decrease Outcome: Progressing

## 2024-10-24 NOTE — Progress Notes (Signed)
 PROGRESS NOTE   Subjective/Complaints:  Pt doing well again today, slept ok, pain still better overall and manageable. LBM just now, 2nd for the morning, states it's getting loose (type 5 documented but pt states it's too soft), would like to decrease bowel meds. Urinating ok, no cath since yesterday AM, does better if he gets onto commode. No other complaints or concerns.   ROS: as per HPI. Denies CP, SOB, abd pain, N/V/D/C, or any other complaints at this time.     Objective:   No results found.  No results for input(s): WBC, HGB, HCT, PLT in the last 72 hours.  No results for input(s): NA, K, CL, CO2, GLUCOSE, BUN, CREATININE, CALCIUM  in the last 72 hours.   Intake/Output Summary (Last 24 hours) at 10/24/2024 1039 Last data filed at 10/24/2024 0912 Gross per 24 hour  Intake 951 ml  Output --  Net 951 ml     Wound 10/11/24 1630 Pressure Injury Heel Right Unstageable - Full thickness tissue loss in which the base of the injury is covered by slough (yellow, tan, gray, green or brown) and/or eschar (tan, brown or black) in the wound bed. (Active)     Wound 10/22/24 0945 Pressure Injury Foot Right Stage 1 -  Intact skin with non-blanchable redness of a localized area usually over a bony prominence. (Active)    Physical Exam: Vital Signs Blood pressure 96/69, pulse 92, temperature (!) 97.5 F (36.4 C), temperature source Oral, resp. rate 16, height 5' 9 (1.753 m), weight 86.2 kg, SpO2 93%.    Constitutional: No distress . Vital signs reviewed. Pt comfortable. Resting in bed. HEENT: NCAT, EOMI, oral membranes moist Neck: supple Cardiovascular: RRR without murmur. No JVD    Respiratory/Chest: CTA Bilaterally without wheezes or rales. Normal effort    GI/Abdomen: BS +, non-tender, non-distended, soft Ext: no clubbing, cyanosis, or edema although RLE wrapped so limited exam Psych: pleasant and  cooperative   PRIOR EXAMS: General: Skin is warm. Wounds presenting still similar to below -vac removed from right lower leg                                   Neurological:     Comments: Patient is alert.     Oriented x 3 and follows commands. CN exam non-focal. Fair insight and awareness. MMT: BUE 5/5. LLE limited by  pain but appears to be moving all muscle groups. RLE limited by wounds/dressings, etc. Sensory exam normal for light touch and pain except for right foot where there is diminished LT along dorsum and to a lesser extent plantar aspect of foot.  No limb ataxia or cerebellar signs. No abnormal tone appreciated.     Musc: right heel cord tight- still about  minus 10-deg      Assessment/Plan: 1. Functional deficits which require 3+ hours per day of interdisciplinary therapy in a comprehensive inpatient rehab setting. Physiatrist is providing close team supervision and 24 hour management of active medical problems listed below. Physiatrist and rehab team continue to assess barriers to discharge/monitor patient progress toward functional and medical goals  Care  Tool:  Bathing    Body parts bathed by patient: Right arm, Left arm, Chest, Abdomen, Face   Body parts bathed by helper: Front perineal area, Buttocks, Right upper leg, Left lower leg, Right lower leg, Left upper leg     Bathing assist Assist Level: Total Assistance - Patient < 25%     Upper Body Dressing/Undressing Upper body dressing   What is the patient wearing?: Pull over shirt    Upper body assist Assist Level: Supervision/Verbal cueing    Lower Body Dressing/Undressing Lower body dressing      What is the patient wearing?: Pants, Incontinence brief     Lower body assist Assist for lower body dressing: Maximal Assistance - Patient 25 - 49%     Toileting Toileting    Toileting assist Assist for toileting: Maximal Assistance - Patient 25 - 49%     Transfers Chair/bed  transfer  Transfers assist  Chair/bed transfer activity did not occur: Safety/medical concerns  Chair/bed transfer assist level: 2 Helpers     Locomotion Ambulation   Ambulation assist   Ambulation activity did not occur: Safety/medical concerns  Assist level: 2 helpers Assistive device: Walker-rolling Max distance: 15   Walk 10 feet activity   Assist  Walk 10 feet activity did not occur: Safety/medical concerns  Assist level: 2 helpers Assistive device: Walker-rolling   Walk 50 feet activity   Assist Walk 50 feet with 2 turns activity did not occur: Safety/medical concerns         Walk 150 feet activity   Assist Walk 150 feet activity did not occur: Safety/medical concerns         Walk 10 feet on uneven surface  activity   Assist Walk 10 feet on uneven surfaces activity did not occur: Safety/medical concerns         Wheelchair     Assist Is the patient using a wheelchair?: Yes Type of Wheelchair: Manual Wheelchair activity did not occur: Safety/medical concerns         Wheelchair 50 feet with 2 turns activity    Assist    Wheelchair 50 feet with 2 turns activity did not occur: Safety/medical concerns       Wheelchair 150 feet activity     Assist  Wheelchair 150 feet activity did not occur: Safety/medical concerns       Blood pressure 96/69, pulse 92, temperature (!) 97.5 F (36.4 C), temperature source Oral, resp. rate 16, height 5' 9 (1.753 m), weight 86.2 kg, SpO2 93%.  Medical Problem List and Plan: 1. Functional deficits secondary to PAD/compartment syndrome/right leg ischemia/status post thrombectomy.  Status post right leg anterior compartment fasciotomy 9/26 with multiple washouts as well as excisional debridement/wound VAC.  Patient is completing a course of doxycycline             -patient may not yet shower             -ELOS/Goals: 11/11,  supervision goals  -ordered PRAFO to help stretch right heel cord   -Continue CIR therapies including PT, OT, and SLP  -pt will need an AFO but given his ongoing wound issues, he's not ready for it quite yet.  2.  Antithrombotics: -DVT/anticoagulation:  Pharmaceutical: now on eliquis 5mg  BID             -antiplatelet therapy: Aspirin  81 mg daily 3. Pain Management: Lyrica  150 mg 3 times daily,  tylenol  prn.    10/22 doing well with tylenol  alone. Off all narcotics (  prefers this) 10/24 pt doesn't want to increase lyrica  any further at this point (not too much higher it can go) 4. Mood/Behavior/Sleep: Cymbalta  30 mg nightly, Celexa  20 mg daily             -antipsychotic agents: N/A  -ambien  PRN 5. Neuropsych/cognition: This patient is capable of making decisions on his own behalf. 6. Skin/Wound Care: Routine skin checks -vac removed-->Vashe soaked gauze over wound,dry gauze, kerlix, ACE --daily change -local care and pressure relief as needed to multiple other wounds. On doxycycline             -prevalon boot/pressure relief, right heel wound   7. Fluids/Electrolytes/Nutrition: Routine and analysis with follow-up chemistries, continue vitamins/supplements.   8. Lumbar radiculopathy/large disc herniation spinal stenosis.  Status post L4-5 transforaminal interbody fusion/posterior lateral fusion insertion interbody device L4-5 decompression 09/08/2024 per Dr. Beuford.  Back brace when out of bed  -10/24--Dr. Letitia revised TLSO to LSO which he needs to wear when standing or ambulating   -back precautions ongoing 9.  Acute blood loss anemia.  Follow-up CBC 10/14- Pt has ~ 150-200cc out of VAC- not sure since when? Hb down 1/2 unit from 8.9 to 8.4- will recheck Thursday  10/15- Hb will check in AM  10/17- Hb 9.0 10.  AKI.  Resolved after IV hydration. 11.  Hyperlipidemia.  Crestor  20mg  daily 12.  COPD/remote tobacco use.  Monitor oxygen  saturations every shift 10/15- was on O2 this AM- per nursing from overnight only- but Sats 89-91%- getting CXR and ABG to  see where he is- and getting sepsis work up.   10/16- started on Unasyn and will start Mucinex for coarse cough-   13.  History of prostate cancer/urinary retention.  History of status post prostatectomy.  External catheter currently in place.  Continue Flomax  0.4 mg daily 10/14- is voiding- but had urinary retention last time- will check a few bladder scans.  10/15 - pt needing in/out caths- had >1400cc in bladder- has cathing protocol now 10/16-10/17 has orders to bladder scan and in/out if volumes >350cc- requiring caths sometimes  10/18 may need to cath more frequently if he's having volumes >1000cc  10/19 cath volumes a little less yesterday--continue with current mgt  10/24 still having intermittent retention   -continue low dose urecholine at high volumes   -increase urecholine to 25mg  tid, continue flomax    -up to EOB/toilet to empty -10/24/24 no cath since yesterday 0625am, does best when EOB/commode, encouraged to continue trying that.  14.  History of detached retina left eye.  Follow-up outpatient ophthalmology. 15. Severe constipation 10/14- LBM at least 4-5 days ago per documentation- pt doesn't remember when went last- will get KUB- and order Sorbitol  30cc- and SSE if no results- having abd pain.  10/16- Pt had 1 liquid stool- but otherwise, no other stools in 6-7 days- last KUB shows on 10/14 was full of stool- will give Sorbitol  since likely has hard stool that liquid getting by- and SSE- if that doesn't work, will ask nursing to do Mg citrate.   10/17- had massive BM  per pt- per chart 1 large, 1 medium-   10/18 smear yesterday. Continue senna-s and increase miralax  to bid.  10/19 two small bm's today 10/24 had large T3 BM today but still feels constipated. Will increase senna-s to 3 tabs at bedtime -10/23/24 LBM this morning, type 5; monitor current regimen.  -10/24/24 LBM this morning x2, stated loose but documented type 5-- but pt says they're  looser than he'd like.    -decrease senna to 1 tab nightly  -change miralax  to daily PRN -advised that if he starts having hard BMs or misses a day of BM, NEEDS TO ASK FOR MIRALAX  OR TO INCREASE HIS MEDS BACK 16.  Hypokalemia  10/14- K+ 3.2- will replete KCL 40 Meq x1. -- fine on 10/23 17. Low grade temp in setting of delirium  10/15- will get sepsis work up, ammonia level and ABG  10/17- has HCAP- Per #21 18. Cirrhosis on last Abd imaging  10/15- will get Ammonia as well esp with new jerking/tremors.  10/16- is actually shivering this AM, but isn't cold- Ammonia 16- not the cause of Sx's.   10/20- ALT is 47- slightly up but AST down to 37- can con't tylenol  as ordered  19. Infrarenal aortic aneurysm with mural thrombus- 6.8 cm on last imaging   20. Orthostatic hypotension 10/16- On Flomax , however No BP meds that I can reduce- will check with therapy- if dropping more, will add Midodrine- ordering ACE wrap on LLE  -addendum- adding Midodrine 5 mg TID starting at 630 am  10/19 bp's a little more robust over weekend thus far. No changes today  10/22-24 improved on midodrine -10/25-26/25 BPs still soft at times, but stable, and pt doing well. Monitor.  Vitals:   10/20/24 1944 10/21/24 0527 10/21/24 1548 10/21/24 2016  BP: 106/75 106/73 105/71 109/78   10/22/24 0532 10/22/24 1603 10/22/24 1722 10/22/24 1929  BP: 114/74 (!) 82/63 102/72 90/73   10/23/24 0509 10/23/24 1325 10/23/24 1943 10/24/24 0353  BP: 102/67 90/77 98/65  96/69    21. HCAP 10/16- on Unasyn-  will con't for 5-6 days- monitor WBC- down to 12.7 from 13.2 last evening. Shivering could be due to breaking a temp>>?? 10/17- no tremors/shivering- will recheck CBC in AM- and make sure on right ABX-  and CMP due to LFTS/renal check on IV ABX- Added Albuterol  scheduled QID due to wheezing and bad COPD   10/20- WBC down to 8.2-   Unasyn completed        LOS: 13 days A FACE TO FACE EVALUATION WAS PERFORMED  [address] 10/24/2024, 10:39 AM

## 2024-10-25 DIAGNOSIS — T79A21A Traumatic compartment syndrome of right lower extremity, initial encounter: Secondary | ICD-10-CM | POA: Diagnosis not present

## 2024-10-25 LAB — CBC WITH DIFFERENTIAL/PLATELET
Abs Immature Granulocytes: 0.11 K/uL — ABNORMAL HIGH (ref 0.00–0.07)
Basophils Absolute: 0.1 K/uL (ref 0.0–0.1)
Basophils Relative: 1 %
Eosinophils Absolute: 0.5 K/uL (ref 0.0–0.5)
Eosinophils Relative: 7 %
HCT: 31.8 % — ABNORMAL LOW (ref 39.0–52.0)
Hemoglobin: 9.9 g/dL — ABNORMAL LOW (ref 13.0–17.0)
Immature Granulocytes: 2 %
Lymphocytes Relative: 20 %
Lymphs Abs: 1.5 K/uL (ref 0.7–4.0)
MCH: 29.7 pg (ref 26.0–34.0)
MCHC: 31.1 g/dL (ref 30.0–36.0)
MCV: 95.5 fL (ref 80.0–100.0)
Monocytes Absolute: 0.6 K/uL (ref 0.1–1.0)
Monocytes Relative: 7 %
Neutro Abs: 4.8 K/uL (ref 1.7–7.7)
Neutrophils Relative %: 63 %
Platelets: 245 K/uL (ref 150–400)
RBC: 3.33 MIL/uL — ABNORMAL LOW (ref 4.22–5.81)
RDW: 18.8 % — ABNORMAL HIGH (ref 11.5–15.5)
WBC: 7.5 K/uL (ref 4.0–10.5)
nRBC: 0 % (ref 0.0–0.2)

## 2024-10-25 LAB — BASIC METABOLIC PANEL WITH GFR
Anion gap: 10 (ref 5–15)
BUN: 24 mg/dL — ABNORMAL HIGH (ref 8–23)
CO2: 24 mmol/L (ref 22–32)
Calcium: 8.8 mg/dL — ABNORMAL LOW (ref 8.9–10.3)
Chloride: 104 mmol/L (ref 98–111)
Creatinine, Ser: 1.03 mg/dL (ref 0.61–1.24)
GFR, Estimated: >60 mL/min (ref >60)
Glucose, Bld: 104 mg/dL — ABNORMAL HIGH (ref 70–99)
Potassium: 3.8 mmol/L (ref 3.5–5.1)
Sodium: 138 mmol/L (ref 135–145)

## 2024-10-25 NOTE — Progress Notes (Signed)
 Occupational Therapy Session Note  Patient Details  Name: David King MRN: [id number] Date of Birth: [birth year]  Today's Date: 10/25/2024 OT Individual Time: 0700-0815 OT Individual Time Calculation (min): 75 min    Short Term Goals: Week 3:  OT Short Term Goal 1 (Week 3): Patient to perform toileting with Mod A OT Short Term Goal 2 (Week 3): Pt will complete LB dressing with min A OT Short Term Goal 3 (Week 3): Pt will complete bathing tasks with min A using AE PRN  Skilled Therapeutic Interventions/Progress Updates:    Skilled OT intervention with focus on bed mobility, sit<>stand, functional transfers, LB dressing, toileting, and activity tolerance to increase independence with bADLs. Supine>sit with supervision. Pt used reacher to assist with donning pants with min A. Pt required assistance to don sock and shoes. WIll use sock aide and shoe horn tomorrow to increase independence. BSC transfer with CGA. Mod A for toileting tasks. Pt reamined in w/c with all needs withn reach.   Therapy Documentation Precautions:  Precautions Precautions: Fall, Back Precaution Booklet Issued: Yes (comment) Recall of Precautions/Restrictions: Impaired Precaution/Restrictions Comments: watch BP (orthostatic); RLE foot drop, wound VAC Required Braces or Orthoses: Spinal Brace Spinal Brace: Thoracolumbosacral orthotic, Applied in sitting position Restrictions Weight Bearing Restrictions Per Provider Order: No RLE Weight Bearing Per Provider Order: Weight bearing as tolerated Other Position/Activity Restrictions: transfers only on RLE   Pain: Pt reports RLE (foot) intermittent shooting pain; resolves in ~60 secs, MD aware  Therapy/Group: Individual Therapy  [name] 10/25/2024, 9:44 AM

## 2024-10-25 NOTE — Progress Notes (Signed)
 Speech Language Pathology Discharge Summary  Patient Details  Name: David King MRN: [id number] Date of Birth: 1956-11-21  Date of Discharge from SLP service:October 25, 2024  Today's Date: 10/25/2024 SLP Individual Time: 8697-8652 SLP Individual Time Calculation (min): 45 min  Patient has met 3 of 3 long term goals.  Patient to discharge at overall Modified Independent level.  Reasons goals not met:     Clinical Impression/Discharge Summary: Pt has made excellent gains and has met 3 of 3 LTG's this admission due to improved orientation, recall, and problem solving. Pt is curently an overall mod I for cognitive tasks. He will remain in CIR program in order to complete PT & OT. No follow up intervention for speech therpay is warranted at this time.  Care Partner:  Caregiver Able to Provide Assistance: Yes  Type of Caregiver Assistance: Physical  Recommendation:  None      Equipment: none   Reasons for discharge: Treatment goals met   Patient/Family Agrees with Progress Made and Goals Achieved: Yes    [name] 10/25/2024, 4:06 PM

## 2024-10-25 NOTE — Progress Notes (Signed)
 Physical Therapy Session Note  Patient Details  Name: David King MRN: [id number] Date of Birth: [birth year]  Today's Date: 10/25/2024 PT Individual Time: 0900-1000 PT Individual Time Calculation (min): 60 min   Short Term Goals: Week 2:  PT Short Term Goal 1 (Week 2): pt will tolerate standing >60 sec without orthostatic hypotension PT Short Term Goal 2 (Week 2): Pt will ambulate 30 ft or >with LRAD PT Short Term Goal 3 (Week 2): Pt will initiate transfer training with RW  Skilled Therapeutic Interventions/Progress Updates:    Pt seated in w/c on arrival and agreeable to therapy. Pt reports 5/10 RLE pain, no intervention required at this time, therapy to tolerance. Therapist retrieved reclining back w/c to allow pt to self propel but still recline if he becomes dizzy. Also retrieved trial AFO prior to gait training.  Sit to stand and Stand pivot transfer with RW with light min a, nearing CGA. Pt then ambulated 2 x 60 ft (limited by pain in middle toe which improved after seated rest. Pt with notably improved gait with AFO, but still with step to pattern and heavy UE reliance. Pt propelled w/c back to room and remained in chair with needs in reach.     Therapy Documentation Precautions:  Precautions Precautions: Fall, Back Precaution Booklet Issued: Yes (comment) Recall of Precautions/Restrictions: Impaired Precaution/Restrictions Comments: watch BP (orthostatic); RLE foot drop, wound VAC Required Braces or Orthoses: Spinal Brace Spinal Brace: Thoracolumbosacral orthotic, Applied in sitting position Restrictions Weight Bearing Restrictions Per Provider Order: No RLE Weight Bearing Per Provider Order: Weight bearing as tolerated Other Position/Activity Restrictions: transfers only on RLE General:   Vital Signs:   Pain: Pain Assessment Pain Scale: 0-10 Pain Score: 7  Pain Location: Foot Pain Intervention(s): Medication (See eMAR) Mobility:   Locomotion :     Trunk/Postural Assessment :    Balance:   Exercises:   Other Treatments:      Therapy/Group: Individual Therapy  [name] 10/25/2024, 10:22 AM

## 2024-10-25 NOTE — Progress Notes (Signed)
 Occupational Therapy Weekly Progress Note  Patient Details  Name: David King MRN: [id number] Date of Birth: [birth year]  Beginning of progress report period: October 18, 2024 End of progress report period: October 25, 2024   Patient has met 3 of 4 short term goals.  Pt made steady progress with BADLs over the past week. Pt dons LSO with supervision. Mod A for LB dressing using reacher. Toileting with mod A. All functional transfers with min A/CGA. Pt fatigues quickly but endurance has improved over the past week. Pt's wife has not been present for therapy sessions.   Patient continues to demonstrate the following deficits: {impairments:3041632} and therefore will continue to benefit from skilled OT intervention to enhance overall performance with {ADL/iADL:3041649}.  Patient {LTG progression:3041653}.  {plan of rjmz:6958345}  OT Short Term Goals Week 2:  OT Short Term Goal 1 (Week 2): Patient to complete LB dressing with Mod A OT Short Term Goal 2 (Week 2): Patient to perform toileting with Mod A OT Short Term Goal 2 - Progress (Week 2): Progressing toward goal OT Short Term Goal 3 (Week 2): Patient to perform toilet transfer with Min assist OT Short Term Goal 4 (Week 2): Pt will don/doff TLSO brace with Mod A Week 3:  OT Short Term Goal 1 (Week 3): Patient to perform toileting with Mod A OT Short Term Goal 2 (Week 3): Pt will complete LB dressing with min A OT Short Term Goal 3 (Week 3): Pt will complete bathing tasks with min A using AE PRN   [name] 10/25/2024, 6:58 AM

## 2024-10-25 NOTE — Progress Notes (Signed)
 Speech Language Pathology Daily Session Note  Patient Details  Name: David King MRN: [id number] Date of Birth: 23-Dec-1956  Today's Date: 10/25/2024 SLP Individual Time: 8697-8652 SLP Individual Time Calculation (min): 45 min  Short Term Goals: Week 1: SLP Short Term Goal 1 (Week 1): Pt will recall recent/relevant information w/ supervision SLP Short Term Goal 2 (Week 1): Pt will solve functional problems w/ superivion SLP Short Term Goal 3 (Week 1): Pt will participate in dynamic assessment of all cognitive domains to determine additional tx goals as needed  Skilled Therapeutic Interventions:  Patient was seen in PM to address cognitive re- training. Pt alert and seated upright in WC upon SLP arrival. He was agreeable for session. Pt was A&Ox4 and recalled participation in multiple OT sessions throughout day providing specific details about each session mod I. SLP further challenging pt in problem solving through focus on daily living and medical problems. Given scenarios presented verbally, pt identified appropriate solutions mod I. SLP recommending discharge from ST as pt has met all short term goals at this time. SLP to sign off.   Pain Pain Assessment Pain Scale: 0-10 Pain Score: 6  Pain Type: Surgical pain;Acute pain Pain Location: Foot Pain Orientation: Right Pain Descriptors / Indicators: Aching Pain Intervention(s): Medication (See eMAR)  Therapy/Group: Individual Therapy  [name] 10/25/2024, 3:56 PM

## 2024-10-25 NOTE — Progress Notes (Addendum)
 Physical Therapy Session Note  Patient Details  Name: David King MRN: [id number] Date of Birth: 1956-08-16  Today's Date: 10/25/2024 PT Individual Time: 8954-8884 PT Individual Time Calculation (min): 30 min   Short Term Goals: Week 1:  PT Short Term Goal 1 (Week 1): Pt will tolerate sitting upright >2 hours PT Short Term Goal 1 - Progress (Week 1): Met PT Short Term Goal 2 (Week 1): Pt will perform stedy transfers with mod a or better PT Short Term Goal 2 - Progress (Week 1): Met PT Short Term Goal 3 (Week 1): Pt will consistently perform supine<>sit with CGA PT Short Term Goal 3 - Progress (Week 1): Met  Skilled Therapeutic Interventions/Progress Updates:    Pt up in wc when PT arrived.  Pt agreeable for session. Pt able to donn LSO brace indep. Pt amb with RW and w/c follow from room to day gym with rest break ~x45' before rest; total distance 161'.  Pt demos good stride/step length bilaterally. Pt has AFO RLE donned. NuStep x11' UE/LE level 3. Pt amb back to room with w/c follow, with x3 breaks due to UE/LE weakness.  Noted buckling RLE with fatigue, pt able support self with RW.  Pt stayed up in w/c for lunch. PT provided green TB for pt to perform LE ex's when not in therapy.  All items within reach.  Therapy Documentation Precautions:  Precautions Precautions: Fall, Back Precaution Booklet Issued: Yes (comment) Recall of Precautions/Restrictions: Impaired Precaution/Restrictions Comments: watch BP (orthostatic); RLE foot drop, wound VAC Required Braces or Orthoses: Spinal Brace Spinal Brace: Thoracolumbosacral orthotic, Applied in sitting position Restrictions Weight Bearing Restrictions Per Provider Order: No RLE Weight Bearing Per Provider Order: Weight bearing as tolerated Other Position/Activity Restrictions: transfers only on RLE  Pain: pt reports 6/10 right LE       Therapy/Group: Individual Therapy  [name] Fast 10/25/2024, 12:20 PM

## 2024-10-25 NOTE — Plan of Care (Signed)
 Pt's goals upgraded d/t progress

## 2024-10-25 NOTE — Progress Notes (Signed)
 PROGRESS NOTE   Subjective/Complaints:  Pt reports they took his VAC off- doing wet to dry BID  making gains in strength daily.  . Strong pains that last a few seconds- ~ 12x/day but can tolerate- taking tylenol .   Bowels working better off oxy.  LBM yesterday PM.  Only wearing LSO now- not TLSO.     ROS: as per HPI  Pt denies SOB, abd pain, CP, N/V/C/D, and vision changes   Objective:   No results found.  Recent Labs    10/25/24 0437  WBC 7.5  HGB 9.9*  HCT 31.8*  PLT 245    Recent Labs    10/25/24 0437  NA 138  K 3.8  CL 104  CO2 24  GLUCOSE 104*  BUN 24*  CREATININE 1.03  CALCIUM  8.8*     Intake/Output Summary (Last 24 hours) at 10/25/2024 0813 Last data filed at 10/25/2024 0013 Gross per 24 hour  Intake 1046 ml  Output 500 ml  Net 546 ml     Wound 10/11/24 1630 Pressure Injury Heel Right Unstageable - Full thickness tissue loss in which the base of the injury is covered by slough (yellow, tan, gray, green or brown) and/or eschar (tan, brown or black) in the wound bed. (Active)     Wound 10/22/24 0945 Pressure Injury Foot Right Stage 1 -  Intact skin with non-blanchable redness of a localized area usually over a bony prominence. (Active)    Physical Exam: Vital Signs Blood pressure 100/72, pulse 90, temperature (!) 97.5 F (36.4 C), temperature source Oral, resp. rate 16, height 5' 9 (1.753 m), weight 86.2 kg, SpO2 95%.     General: awake, alert, appropriate, sitting up in bed; therapy in room; eating; NAD HENT: conjugate gaze; oropharynx moist CV: regular rate and rhythm; no JVD Pulmonary: CTA B/L; no W/R/R- good air movement GI: soft, NT, ND, (+)BS Psychiatric: appropriate Neurological: Ox3  Ext: no clubbing, cyanosis, or edema although RLE wrapped with ACE wrap so cannot assess RLE incision Psych: pleasant and cooperative   PRIOR EXAMS: General: Skin is warm. Wounds  presenting still similar to below -vac removed from right lower leg                                   Neurological:     Comments: Patient is alert.     Oriented x 3 and follows commands. CN exam non-focal. Fair insight and awareness. MMT: BUE 5/5. LLE limited by  pain but appears to be moving all muscle groups. RLE limited by wounds/dressings, etc. Sensory exam normal for light touch and pain except for right foot where there is diminished LT along dorsum and to a lesser extent plantar aspect of foot.  No limb ataxia or cerebellar signs. No abnormal tone appreciated.     Musc: right heel cord tight- still about  minus 10-deg      Assessment/Plan: 1. Functional deficits which require 3+ hours per day of interdisciplinary therapy in a comprehensive inpatient rehab setting. Physiatrist is providing close team supervision and 24 hour management of active medical problems listed below.  Physiatrist and rehab team continue to assess barriers to discharge/monitor patient progress toward functional and medical goals  Care Tool:  Bathing    Body parts bathed by patient: Right arm, Left arm, Chest, Abdomen, Face   Body parts bathed by helper: Front perineal area, Buttocks, Right upper leg, Left lower leg, Right lower leg, Left upper leg     Bathing assist Assist Level: Total Assistance - Patient < 25%     Upper Body Dressing/Undressing Upper body dressing   What is the patient wearing?: Pull over shirt    Upper body assist Assist Level: Supervision/Verbal cueing    Lower Body Dressing/Undressing Lower body dressing      What is the patient wearing?: Pants, Incontinence brief     Lower body assist Assist for lower body dressing: Maximal Assistance - Patient 25 - 49%     Toileting Toileting    Toileting assist Assist for toileting: Maximal Assistance - Patient 25 - 49%     Transfers Chair/bed transfer  Transfers assist  Chair/bed transfer activity did not  occur: Safety/medical concerns  Chair/bed transfer assist level: 2 Helpers     Locomotion Ambulation   Ambulation assist   Ambulation activity did not occur: Safety/medical concerns  Assist level: 2 helpers Assistive device: Walker-rolling Max distance: 15   Walk 10 feet activity   Assist  Walk 10 feet activity did not occur: Safety/medical concerns  Assist level: 2 helpers Assistive device: Walker-rolling   Walk 50 feet activity   Assist Walk 50 feet with 2 turns activity did not occur: Safety/medical concerns         Walk 150 feet activity   Assist Walk 150 feet activity did not occur: Safety/medical concerns         Walk 10 feet on uneven surface  activity   Assist Walk 10 feet on uneven surfaces activity did not occur: Safety/medical concerns         Wheelchair     Assist Is the patient using a wheelchair?: Yes Type of Wheelchair: Manual Wheelchair activity did not occur: Safety/medical concerns         Wheelchair 50 feet with 2 turns activity    Assist    Wheelchair 50 feet with 2 turns activity did not occur: Safety/medical concerns       Wheelchair 150 feet activity     Assist  Wheelchair 150 feet activity did not occur: Safety/medical concerns       Blood pressure 100/72, pulse 90, temperature (!) 97.5 F (36.4 C), temperature source Oral, resp. rate 16, height 5' 9 (1.753 m), weight 86.2 kg, SpO2 95%.  Medical Problem List and Plan: 1. Functional deficits secondary to PAD/compartment syndrome/right leg ischemia/status post thrombectomy.  Status post right leg anterior compartment fasciotomy 9/26 with multiple washouts as well as excisional debridement/wound VAC.  Patient is completing a course of doxycycline             -patient may not yet shower             -ELOS/Goals: 11/11,  supervision goals  -ordered PRAFO to help stretch right heel cord  Con't CIR PT and OT -pt will need an AFO but given his ongoing  wound issues, he's not ready for it quite yet.  2.  Antithrombotics: -DVT/anticoagulation:  Pharmaceutical: now on eliquis 5mg  BID             -antiplatelet therapy: Aspirin  81 mg daily 3. Pain Management: Lyrica  150 mg 3 times daily,  tylenol  prn.    10/22 doing well with tylenol  alone. Off all narcotics (prefers this) 10/24 pt doesn't want to increase lyrica  any further at this point (not too much higher it can go) 10/27- pain tolerable- spikes ~ 12x/day but going well otherwise with tylenol  alone 4. Mood/Behavior/Sleep: Cymbalta  30 mg nightly, Celexa  20 mg daily             -antipsychotic agents: N/A  -ambien  PRN 5. Neuropsych/cognition: This patient is capable of making decisions on his own behalf. 6. Skin/Wound Care: Routine skin checks -vac removed-->Vashe soaked gauze over wound,dry gauze, kerlix, ACE --daily change -local care and pressure relief as needed to multiple other wounds. On doxycycline             -prevalon boot/pressure relief, right heel wound   7. Fluids/Electrolytes/Nutrition: Routine and analysis with follow-up chemistries, continue vitamins/supplements.   8. Lumbar radiculopathy/large disc herniation spinal stenosis.  Status post L4-5 transforaminal interbody fusion/posterior lateral fusion insertion interbody device L4-5 decompression 09/08/2024 per Dr. Beuford.  Back brace when out of bed  -10/24--Dr. Letitia revised TLSO to LSO which he needs to wear when standing or ambulating   -back precautions ongoing 9.  Acute blood loss anemia.  Follow-up CBC 10/14- Pt has ~ 150-200cc out of VAC- not sure since when? Hb down 1/2 unit from 8.9 to 8.4- will recheck Thursday  10/15- Hb will check in AM  10/17- Hb 9.0  10/27- Hb 9.9- VAC off 10.  AKI.  Resolved after IV hydration. 11.  Hyperlipidemia.  Crestor  20mg  daily 12.  COPD/remote tobacco use.  Monitor oxygen  saturations every shift 10/15- was on O2 this AM- per nursing from overnight only- but Sats 89-91%- getting  CXR and ABG to see where he is- and getting sepsis work up.   10/16- started on Unasyn and will start Mucinex for coarse cough-   13.  History of prostate cancer/urinary retention.  History of status post prostatectomy.  External catheter currently in place.  Continue Flomax  0.4 mg daily 10/14- is voiding- but had urinary retention last time- will check a few bladder scans.  10/15 - pt needing in/out caths- had >1400cc in bladder- has cathing protocol now 10/16-10/17 has orders to bladder scan and in/out if volumes >350cc- requiring caths sometimes  10/18 may need to cath more frequently if he's having volumes >1000cc  10/19 cath volumes a little less yesterday--continue with current mgt  10/24 still having intermittent retention   -continue low dose urecholine at high volumes   -increase urecholine to 25mg  tid, continue flomax    -up to EOB/toilet to empty -10/24/24 no cath since yesterday 0625am, does best when EOB/commode, encouraged to continue trying that.  10/27- not requiring caths still 14.  History of detached retina left eye.  Follow-up outpatient ophthalmology. 15. Severe constipation 10/14- LBM at least 4-5 days ago per documentation- pt doesn't remember when went last- will get KUB- and order Sorbitol  30cc- and SSE if no results- having abd pain.  10/16- Pt had 1 liquid stool- but otherwise, no other stools in 6-7 days- last KUB shows on 10/14 was full of stool- will give Sorbitol  since likely has hard stool that liquid getting by- and SSE- if that doesn't work, will ask nursing to do Mg citrate.   10/17- had massive BM  per pt- per chart 1 large, 1 medium-   10/18 smear yesterday. Continue senna-s and increase miralax  to bid.  10/19 two small bm's today 10/24 had large T3 BM  today but still feels constipated. Will increase senna-s to 3 tabs at bedtime -10/23/24 LBM this morning, type 5; monitor current regimen.  -10/24/24 LBM this morning x2, stated loose but documented type 5--  but pt says they're looser than he'd like.   -decrease senna to 1 tab nightly  -change miralax  to daily PRN -advised that if he starts having hard BMs or misses a day of BM, NEEDS TO ASK FOR MIRALAX  OR TO INCREASE HIS MEDS BACK 10/27- LBM yesterday not hard per pt  16.  Hypokalemia  10/14- K+ 3.2- will replete KCL 40 Meq x1. -- fine on 10/23 10/27- K+ 3.8 17. Low grade temp in setting of delirium  10/15- will get sepsis work up, ammonia level and ABG  10/17- has HCAP- Per #21 18. Cirrhosis on last Abd imaging  10/15- will get Ammonia as well esp with new jerking/tremors.  10/16- is actually shivering this AM, but isn't cold- Ammonia 16- not the cause of Sx's.   10/20- ALT is 47- slightly up but AST down to 37- can con't tylenol  as ordered  19. Infrarenal aortic aneurysm with mural thrombus- 6.8 cm on last imaging   20. Orthostatic hypotension 10/16- On Flomax , however No BP meds that I can reduce- will check with therapy- if dropping more, will add Midodrine- ordering ACE wrap on LLE  -addendum- adding Midodrine 5 mg TID starting at 630 am  10/19 bp's a little more robust over weekend thus far. No changes today  10/22-24 improved on midodrine -10/25-26/25 BPs still soft at times, but stable, and pt doing well. Monitor.  Vitals:   10/21/24 2016 10/22/24 0532 10/22/24 1603 10/22/24 1722  BP: 109/78 114/74 (!) 82/63 102/72   10/22/24 1929 10/23/24 0509 10/23/24 1325 10/23/24 1943  BP: 90/73 102/67 90/77 98/65    10/24/24 0353 10/24/24 1423 10/24/24 1943 10/25/24 0450  BP: 96/69 106/80 93/69 100/72    21. HCAP 10/16- on Unasyn-  will con't for 5-6 days- monitor WBC- down to 12.7 from 13.2 last evening. Shivering could be due to breaking a temp>>?? 10/17- no tremors/shivering- will recheck CBC in AM- and make sure on right ABX-  and CMP due to LFTS/renal check on IV ABX- Added Albuterol  scheduled QID due to wheezing and bad COPD   10/20- WBC down to 8.2-   Unasyn completed    I spent  a total of  37  minutes on total care today- >50% coordination of care- due to  D/w pt, therapy, and review of labs, chart and vitals-    LOS: 14 days A FACE TO FACE EVALUATION WAS PERFORMED  Khoen Genet 10/25/2024, 8:13 AM

## 2024-10-25 NOTE — Plan of Care (Signed)
  Problem: RH Problem Solving Goal: LTG Patient will demonstrate problem solving for (SLP) Description: LTG:  Patient will demonstrate problem solving for basic/complex daily situations with cues  (SLP) Outcome: Completed/Met Flowsheets Taken 10/25/2024 1602 by Lars Joane RAMAN, CCC-SLP LTG Patient will demonstrate problem solving for: Modified Independent Taken 10/14/2024 2024 by Berna Recardo LABOR, CCC-SLP LTG: Patient will demonstrate problem solving for (SLP): Basic daily situations   Problem: RH Memory Goal: LTG Patient will demonstrate ability for day to day (SLP) Description: LTG:   Patient will demonstrate ability for day to day recall/carryover during cognitive/linguistic activities with assist  (SLP) Outcome: Completed/Met Flowsheets Taken 10/25/2024 1602 by Lars Joane RAMAN, CCC-SLP LTG: Patient will demonstrate ability for day to day recall/carryover during cognitive/linguistic activities with assist (SLP): Modified Independent Taken 10/14/2024 2024 by Berna Recardo LABOR, CCC-SLP LTG: Patient will demonstrate ability for day to day recall:  New information  Biographical information   Problem: RH Cognition - SLP Goal: RH LTG Patient will demonstrate orientation with cues Description:  LTG:  Patient will demonstrate orientation to person/place/time/situation with cues (SLP)   Outcome: Completed/Met Flowsheets Taken 10/25/2024 1602 by Lars Joane RAMAN, CCC-SLP LTG: Patient will demonstrate orientation using cueing (SLP): Modified Independent Taken 10/14/2024 2024 by Berna Recardo LABOR, CCC-SLP LTG Patient will demonstrate orientation to:  Person  Place  Time  Situation

## 2024-10-25 NOTE — Progress Notes (Signed)
 Occupational Therapy Session Note  Patient Details  Name: OSTEN JANEK MRN: [id number] Date of Birth: 1956/07/18  Today's Date: 10/25/2024 OT Individual Time: 0830-0900 OT Individual Time Calculation (min): 30 min    Short Term Goals: Week 2:  OT Short Term Goal 1 (Week 2): Patient to complete LB dressing with Mod A OT Short Term Goal 2 (Week 2): Patient to perform toileting with Mod A OT Short Term Goal 2 - Progress (Week 2): Progressing toward goal OT Short Term Goal 3 (Week 2): Patient to perform toilet transfer with Min assist OT Short Term Goal 4 (Week 2): Pt will don/doff TLSO brace with Mod A  Skilled Therapeutic Interventions/Progress Updates:      Therapy Documentation Precautions:  Precautions Precautions: Fall, Back Precaution Booklet Issued: Yes (comment) Recall of Precautions/Restrictions: Impaired Precaution/Restrictions Comments: watch BP (orthostatic); RLE foot drop, wound VAC Required Braces or Orthoses: Spinal Brace Spinal Brace: Thoracolumbosacral orthotic, Applied in sitting position Restrictions Weight Bearing Restrictions Per Provider Order: No RLE Weight Bearing Per Provider Order: Weight bearing as tolerated Other Position/Activity Restrictions: transfers only on RLE General: Pt seated in W/C upon OT arrival, agreeable to OT.  Pain:  4/10 pain reported in RLE, activity, intermittent rest breaks, distractions provided for pain management, pt reports tolerable to proceed.   Exercises: Pt noting BUE fatiguing during activities d/t low endurance. Pt reporting playing guitar is a favorite hobby which may be hard now d/t UE function. OT issuing theraputty and resistive sponge with demo of exercises in order to increase [facility] and endurance in order for pt to complete meaningful activities.   Pt seated in W/C at end of session with direct handoff to PT for session.   Therapy/Group: Individual Therapy  Camie Hoe, OTD, OTR/L 10/25/2024, 10:28 AM

## 2024-10-26 DIAGNOSIS — T79A21A Traumatic compartment syndrome of right lower extremity, initial encounter: Secondary | ICD-10-CM | POA: Diagnosis not present

## 2024-10-26 NOTE — Progress Notes (Signed)
 Occupational Therapy Session Note  Patient Details  Name: David King MRN: [id number] Date of Birth: 01/25/1956  Today's Date: 10/26/2024 OT Individual Time: 0800-0900 OT Individual Time Calculation (min): 60 min    Short Term Goals: Week 2:  OT Short Term Goal 1 (Week 2): Patient to complete LB dressing with Mod A OT Short Term Goal 2 (Week 2): Patient to perform toileting with Mod A OT Short Term Goal 2 - Progress (Week 2): Progressing toward goal OT Short Term Goal 3 (Week 2): Patient to perform toilet transfer with Min assist OT Short Term Goal 4 (Week 2): Pt will don/doff TLSO brace with Mod A  Skilled Therapeutic Interventions/Progress Updates:    Skilled OT intervention with focus on LB dressing, functional amb with RW, toileting, and standing balance to increase independence with BADLs. Pt used AE to assist with footwear. Mod A for donning footwear. Min A for donning pants using reacher to thread BLE. Amb with RW to bathroom for toileting. Mod A for toileting. Amb with CGA. Standing at sink to wash hands and face. Pt returned to w/c. All needs within reach.  Therapy Documentation Precautions:  Precautions Precautions: Fall, Back Precaution Booklet Issued: Yes (comment) Recall of Precautions/Restrictions: Impaired Precaution/Restrictions Comments: watch BP (orthostatic); RLE foot drop, wound VAC Required Braces or Orthoses: Spinal Brace Spinal Brace: Thoracolumbosacral orthotic, Applied in sitting position Restrictions Weight Bearing Restrictions Per Provider Order: No RLE Weight Bearing Per Provider Order: Weight bearing as tolerated Other Position/Activity Restrictions: transfers only on RLE   Pain:  Pt c/o intermittent shooting pain in RLE (foot); MD aware, pain resolves in ~30 secs   Therapy/Group: Individual Therapy  [name] 10/26/2024, 12:29 PM

## 2024-10-26 NOTE — Progress Notes (Signed)
 Physical Therapy Session Note  Patient Details  Name: David King MRN: [id number] Date of Birth: 03-Feb-1956  Today's Date: 10/26/2024   Short Term Goals: Week 1:  PT Short Term Goal 1 (Week 1): Pt will tolerate sitting upright >2 hours PT Short Term Goal 1 - Progress (Week 1): Met PT Short Term Goal 2 (Week 1): Pt will perform stedy transfers with mod a or better PT Short Term Goal 2 - Progress (Week 1): Met PT Short Term Goal 3 (Week 1): Pt will consistently perform supine<>sit with CGA PT Short Term Goal 3 - Progress (Week 1): Met Week 2:  PT Short Term Goal 1 (Week 2): pt will tolerate standing >60 sec without orthostatic hypotension PT Short Term Goal 2 (Week 2): Pt will ambulate 30 ft or >with LRAD PT Short Term Goal 3 (Week 2): Pt will initiate transfer training with RW   Skilled Therapeutic Interventions/Progress Updates:   Session #1: PT Individual Time: 9066-8996 PT Individual Time Calculation (min): 30 min  Reports pain in R foot - denies anything from shoe or AFO. States it is the wound. No interventions needed. Session focused on stair negotiation training for functional strengthening, balance, endurance and home access. Pt performed sit to stand with CGA at rail and performed step ups x 1 step x 2, seated break due to dizziness. Repeated x 4 reps, then progressed to 2 steps at a time x 1, x 2, x 2 with overall CGA to min A. Rest breaks needed due to fatigue and dizziness which pt self directed. Pt performed w/c mobility to and from therapies > 150' mod I for functional strengthening and endurance.   Session #2: PT Individual Time: 8897-8867 PT Individual Time Calculation (min): 30 min  Pain unchanged from this AM session. No intervention needed. Discussed overall goals and pt wanting to work again on stairs as this is his main goal right now. W/c propulsion to and from therapies for functional UE strengthening and endurance at mod I level. Pt performed sit > stand with  CGA and performed x 2 steps and then reported dizziness so required seated break and symptoms resolved. Completed 4 steps, turn around at top and descending with overall CGA/min A for balance. Repeated with rest breaks between trials x 3 times total. Returned to room and all needs in reach.   Therapy Documentation Precautions:  Precautions Precautions: Fall, Back Precaution/Restrictions Comments: watch BP (orthostatic); RLE foot drop, Required Braces or Orthoses: Spinal Brace Spinal Brace: lumbosacral orthotic, Applied in sitting position Restrictions Weight Bearing Restrictions Per Provider Order: No RLE Weight Bearing Per Provider Order: Weight bearing as tolerated     Therapy/Group: Individual Therapy  [name] [name] [name], PT, DPT, CBIS  10/26/2024, 11:37 AM

## 2024-10-26 NOTE — Progress Notes (Signed)
 Physical Therapy Note  Patient Details  Name: David King MRN: [id number] Date of Birth: 1956-03-30 Today's Date: 10/26/2024    Physical Therapist participated in the interdisciplinary team conference, providing clinical information regarding the patient's current status, treatment goals, and weekly focus, including any barriers that need to be addressed. Please see the Inpatient Rehabilitation Team Conference and Plan of Care Update for further details.    David King 10/26/2024, 4:43 PM

## 2024-10-26 NOTE — Progress Notes (Signed)
 Orthopedic Tech Progress Note Patient Details:  Duane A Yom Feb 18, 1956 [id number] Called in AFO for right side to [facility] Patient ID: Deward DELENA Pepper, male   DOB: [birth year], 68 y.o.   MRN: [id number]  Efrain DELENA Cos 10/26/2024, 9:07 AM

## 2024-10-26 NOTE — Progress Notes (Signed)
 Physical Therapy Weekly Progress Note  Patient Details  Name: David King MRN: [id number] Date of Birth: 12/19/1956  Beginning of progress report period: October 19, 2024 End of progress report period: October 26, 2024  Today's Date: 10/26/2024 PT Individual Time: 1300-1415 PT Individual Time Calculation (min): 75 min   Patient has met 3 of 3 short term goals.  Pt is progressing extremely well. Goals were recently upgraded to supervision goals d/t recent advancement in pt ability. Pt is able to ambulate up to 100 ft with RW and w/c follow d/t fatigue and intermittent dizziness. Pt navigated up to 4 stairs with CGA-min a. Still limited by endurance and intermittent dizziness.   Patient continues to demonstrate the following deficits muscle weakness, decreased cardiorespiratoy endurance, and decreased standing balance, decreased balance strategies, and difficulty maintaining precautions and therefore will continue to benefit from skilled PT intervention to increase functional independence with mobility.  Patient progressing toward long term goals..  Continue plan of care.  PT Short Term Goals Week 2:  PT Short Term Goal 1 (Week 2): pt will tolerate standing >60 sec without orthostatic hypotension PT Short Term Goal 1 - Progress (Week 2): Met PT Short Term Goal 2 (Week 2): Pt will ambulate 30 ft or >with LRAD PT Short Term Goal 2 - Progress (Week 2): Met PT Short Term Goal 3 (Week 2): Pt will initiate transfer training with RW PT Short Term Goal 3 - Progress (Week 2): Met Week 3:  PT Short Term Goal 1 (Week 3): pt will ambulate >100 ft with CGA PT Short Term Goal 2 (Week 3): pt will navigate x 8 steps PT Short Term Goal 3 (Week 3): pt will perform STS with supervision consistently  Skilled Therapeutic Interventions/Progress Updates:  Pt seated in w/c on arrival and agreeable to therapy. Pt reports unrated pain in R heel, states not related to shoe or brace, no intervention required.  Session focused on gait training for endurance. Pt required min a for gait 50-100 ft x 7 bouts. X 1 instance of knee buckling, but mostly limited by endurance vs LE strength. Rest breaks for fatigue management. Pt then performed 3 x 12 ft side stepping both directions, demoing higher than expected levels of fatigue for this activity. Pt returned to room and to bed with CGA Stand pivot transfer. Bed mobility with supervision. Donned R PRAFO for skin protection, was left with all needs in reach and alarm active.   Therapy Documentation Precautions:  Precautions Precautions: Fall, Back Precaution Booklet Issued: Yes (comment) Recall of Precautions/Restrictions: Impaired Precaution/Restrictions Comments: watch BP (orthostatic); RLE foot drop, wound VAC Required Braces or Orthoses: Spinal Brace Spinal Brace: Thoracolumbosacral orthotic, Applied in sitting position Restrictions Weight Bearing Restrictions Per Provider Order: No RLE Weight Bearing Per Provider Order: Weight bearing as tolerated Other Position/Activity Restrictions: transfers only on RLE General:      Therapy/Group: Individual Therapy  [name] 10/26/2024, 3:51 PM

## 2024-10-26 NOTE — Progress Notes (Signed)
 Patient ID: David King, male   DOB: 1956-04-25, 68 y.o.   MRN: [id number]  SW met with pt in room to provide updates from team conference, d/c date remains 11/11, and HHA in place already-[facility]. SW informed will discuss with his wife family edu.   1614-SW spoke with pt wife to discuss above. Family edu scheduled for Wednesday. November 5 10am-12pm and 1-2pm.   Graeme Jude, MSW, LCSW Office: [phone] Cell: [phone] Fax: [phone]

## 2024-10-26 NOTE — Patient Care Conference (Signed)
 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 Duwaine Barrs Social Worker Present: Graeme Jude, LCSW Nurse Present: Eulalio Falls, RN PT Present: Schuyler Batter, PT OT Present: Charlena Cha, Darrol Hoe, OT PPS Coordinator present : Eleanor Colon, SLP     Current Status/Progress Goal Weekly Team Focus  Bowel/Bladder   Continent x2. Last BM 10/26.   Patient will have regular bowel movements and remain continent with bowel and bladder.   Assist patient with toileting needs.    Swallow/Nutrition/ Hydration               ADL's   bathing-mod A; UB dressing, including LSO-supervision; LB dressing-mod A; funcitonal transfers-CGA, toileting-max A   min A/CGA overall   balance, BADLs, functional ransfers, activity tolerance/endurance, education    Mobility   gait up to 100 ft with min a +w/c follow, light min a for SPT. Limited by endurance and occ OH, AFO consult pending   supervision overall  AFO consult, gait training, endurance    Communication                Safety/Cognition/ Behavioral Observations               Pain   Pain 8/10 to rigt leg. Oxyir 10mg  prn given.   Pain less than 2.   Assess pain q shift prn.     Skin   Unstageable right heel with foam dressing and offloading with pevalon boot. Rt. toe also has blackened area at the tip. Rt/ lateral calf wound with wet to dry dressing changes.  Stage  I top of right foot probably from shoes Prevent skin breakdown.  Monitor skin q shift and assist in repositioning patient.      Discharge Planning:  Home with wife who can assist but does have knee issues. Wife is here daily to provide support. Pt now has WTD dx; HHA- [facility] for HHPT/OT/SN/iade. SW will confirm there are no barriers to discharge.    Team Discussion: Patient was admitted post right leg anterior compartment fasciotomy due to PAD/compartment syndrome and right leg ischemia. Patient progressing well with therapy but still limited by endurance, fatigue, on intermittent dizziness when ambulating, weakness and foot drop. Wound vac removal currently wit, h dressing changes. Patient continues to be continent not needing in and out catheterization.  Patient on target to meet rehab goals: yes, patient needs mod assistance with ADLs. Patient needs CGA with functional transfers. Patient was able to ambulate up to 100' with minimal assistance using a  RW with wheelchair follow. Overall goals at discharge are set for min- supervision assistance.   *See Care Plan and progress notes for long and short-term goals.   Revisions to Treatment Plan:  AFO consult Prevalon boots  Wound care plan Family training  Teaching Needs: Safety, medications, double voiding, transfers, toileting, wound care, incision care, etc.   Current Barriers to Discharge: Decreased caregiver support, Home enviroment access/layout, and Wound care  Possible Resolutions to Barriers: Family Education Home health follow up.     Medical Summary Current Status: Unstageable on R heel; VAC is off on R anterior fasciotomy; R stage I top of R foot- could be from shoe-- pain with tylenol - last CMP- AST/ALT slightly elevated;  off opiates  Barriers to Discharge: Complicated Wound;Behavior/Mood;Self-care education;Weight bearing restrictions;Hypotension;Medical stability;Uncontrolled Pain  Barriers to Discharge Comments: limited by pain- spikes ~10-12x/day;  limited also by: weakness in and R foot drop- and poor endurance;  Orthostatic hypotension- still somewhat limiting; needs R AFO- Possible Resolutions to [employer] Focus: needs bigger shoe for RLE- ordering AFO consult; RLE; cnanot touch wounds;  d/c 11/11   Continued Need for Acute Rehabilitation Level of Care: The patient requires daily medical management by a physician with specialized training in physical medicine and rehabilitation for the following reasons: Direction of a multidisciplinary physical rehabilitation program to maximize functional independence : Yes Medical management of patient stability for increased activity during participation in an intensive rehabilitation regime.: Yes Analysis of laboratory values and/or radiology reports with any subsequent need for medication adjustment and/or medical intervention. : Yes   I attest that I was present, lead the team conference, and concur with the assessment and plan of the team.   Onetta Spainhower Gayo 10/26/2024, 1113 am

## 2024-10-26 NOTE — Progress Notes (Signed)
 Occupational Therapy Note  Patient Details  Name: David King MRN: [id number] Date of Birth: 1956-08-12  Occupational Therapy Assistant participated in the interdisciplinary team conference, providing clinical information regarding the patient's current status, treatment goals, and weekly focus, including any barriers that need to be addressed. Please see the Inpatient Rehabilitation Team Conference and Plan of Care Update for further details.    Maritza Ned [facility] 10/26/2024, 12:10 PM

## 2024-10-26 NOTE — Progress Notes (Signed)
 PROGRESS NOTE   Subjective/Complaints:  Pt reports pain still usually tolerable- doing OK on Tylenol .  [place] doing better since they got him  a new w/c he can push.  Walked to [employer] and back yesterday- appears goals updated due to progress.  Needs to do 4 STE to get into home- wondering if does better, if could leave earlier.   AFO use all the difference.  41st marriage anniversary Sunday- they celebrated yesterday!    ROS: as per HPI   Pt denies SOB, abd pain, CP, N/V/C/D, and vision changes   Objective:   No results found.  Recent Labs    10/25/24 0437  WBC 7.5  HGB 9.9*  HCT 31.8*  PLT 245    Recent Labs    10/25/24 0437  NA 138  K 3.8  CL 104  CO2 24  GLUCOSE 104*  BUN 24*  CREATININE 1.03  CALCIUM  8.8*     Intake/Output Summary (Last 24 hours) at 10/26/2024 0855 Last data filed at 10/25/2024 1648 Gross per 24 hour  Intake --  Output 225 ml  Net -225 ml     Wound 10/11/24 1630 Pressure Injury Heel Right Unstageable - Full thickness tissue loss in which the base of the injury is covered by slough (yellow, tan, gray, green or brown) and/or eschar (tan, brown or black) in the wound bed. (Active)     Wound 10/22/24 0945 Pressure Injury Foot Right Stage 1 -  Intact skin with non-blanchable redness of a localized area usually over a bony prominence. (Active)    Physical Exam: Vital Signs Blood pressure 96/81, pulse 97, temperature 98.2 F (36.8 C), temperature source Oral, resp. rate 17, height 5' 9 (1.753 m), weight 86.2 kg, SpO2 95%.      General: awake, alert, appropriate, sitting up eating; NAD HENT: conjugate gaze; oropharynx moist CV: regular rate and rhythm- rate in 90's; no JVD Pulmonary: CTA B/L; no W/R/R- good air movement GI: soft, NT, ND, (+)BS Psychiatric: appropriate- brighter affect Neurological: Ox3  Ext: no clubbing, cyanosis, or edema although RLE wrapped with ACE  wrap so cannot assess RLE incision- same today- just changed at 6am Psych: pleasant and cooperative   PRIOR EXAMS: General: Skin is warm. Wounds presenting still similar to below -vac removed from right lower leg                                   Neurological:     Comments: Patient is alert.     Oriented x 3 and follows commands. CN exam non-focal. Fair insight and awareness. MMT: BUE 5/5. LLE limited by  pain but appears to be moving all muscle groups. RLE limited by wounds/dressings, etc. Sensory exam normal for light touch and pain except for right foot where there is diminished LT along dorsum and to a lesser extent plantar aspect of foot.  No limb ataxia or cerebellar signs. No abnormal tone appreciated.     Musc: right heel cord tight- still about  minus 10-deg      Assessment/Plan: 1. Functional deficits which require 3+ hours per day  of interdisciplinary therapy in a comprehensive inpatient rehab setting. Physiatrist is providing close team supervision and 24 hour management of active medical problems listed below. Physiatrist and rehab team continue to assess barriers to discharge/monitor patient progress toward functional and medical goals  Care Tool:  Bathing    Body parts bathed by patient: Right arm, Left arm, Chest, Abdomen, Face   Body parts bathed by helper: Front perineal area, Buttocks, Right upper leg, Left lower leg, Right lower leg, Left upper leg     Bathing assist Assist Level: Total Assistance - Patient < 25%     Upper Body Dressing/Undressing Upper body dressing   What is the patient wearing?: Pull over shirt    Upper body assist Assist Level: Supervision/Verbal cueing    Lower Body Dressing/Undressing Lower body dressing      What is the patient wearing?: Pants     Lower body assist Assist for lower body dressing: Minimal Assistance - Patient > 75%     Toileting Toileting    Toileting assist Assist for toileting: Moderate  Assistance - Patient 50 - 74%     Transfers Chair/bed transfer  Transfers assist  Chair/bed transfer activity did not occur: Safety/medical concerns  Chair/bed transfer assist level: Supervision/Verbal cueing     Locomotion Ambulation   Ambulation assist   Ambulation activity did not occur: Safety/medical concerns  Assist level: 2 helpers Assistive device: Walker-rolling Max distance: 161' (total)   Walk 10 feet activity   Assist  Walk 10 feet activity did not occur: Safety/medical concerns  Assist level: 2 helpers Assistive device: Walker-rolling   Walk 50 feet activity   Assist Walk 50 feet with 2 turns activity did not occur: Safety/medical concerns  Assist level: 2 helpers Assistive device: Walker-rolling    Walk 150 feet activity   Assist Walk 150 feet activity did not occur: Safety/medical concerns  Assist level: 2 helpers Assistive device: Walker-rolling    Walk 10 feet on uneven surface  activity   Assist Walk 10 feet on uneven surfaces activity did not occur: Safety/medical concerns         Wheelchair     Assist Is the patient using a wheelchair?: Yes Type of Wheelchair: Manual Wheelchair activity did not occur: Safety/medical concerns         Wheelchair 50 feet with 2 turns activity    Assist    Wheelchair 50 feet with 2 turns activity did not occur: Safety/medical concerns       Wheelchair 150 feet activity     Assist  Wheelchair 150 feet activity did not occur: Safety/medical concerns       Blood pressure 96/81, pulse 97, temperature 98.2 F (36.8 C), temperature source Oral, resp. rate 17, height 5' 9 (1.753 m), weight 86.2 kg, SpO2 95%.  Medical Problem List and Plan: 1. Functional deficits secondary to PAD/compartment syndrome/right leg ischemia/status post thrombectomy.  Status post right leg anterior compartment fasciotomy 9/26 with multiple washouts as well as excisional debridement/wound VAC.   Patient is completing a course of doxycycline             -patient may not yet shower             -ELOS/Goals: 11/11,  supervision goals  -ordered PRAFO to help stretch right heel cord  Con't CIR PT and OT Team conference today- to f/u on progress Will order AFO consult .  2.  Antithrombotics: -DVT/anticoagulation:  Pharmaceutical: now on eliquis 5mg  BID             -  antiplatelet therapy: Aspirin  81 mg daily 3. Pain Management: Lyrica  150 mg 3 times daily,  tylenol  prn.    10/22 doing well with tylenol  alone. Off all narcotics (prefers this) 10/24 pt doesn't want to increase lyrica  any further at this point (not too much higher it can go) 10/27- pain tolerable- spikes ~ 12x/day but going well otherwise with tylenol  alone 10/28- pain mainly tolerable 4. Mood/Behavior/Sleep: Cymbalta  30 mg nightly, Celexa  20 mg daily             -antipsychotic agents: N/A  -ambien  PRN 5. Neuropsych/cognition: This patient is capable of making decisions on his own behalf. 6. Skin/Wound Care: Routine skin checks -vac removed-->Vashe soaked gauze over wound,dry gauze, kerlix, ACE --daily change -local care and pressure relief as needed to multiple other wounds. On doxycycline             -prevalon boot/pressure relief, right heel wound  10/28- Said needed vinegar for a wound on R heel- will d/w nursing 7. Fluids/Electrolytes/Nutrition: Routine and analysis with follow-up chemistries, continue vitamins/supplements.   8. Lumbar radiculopathy/large disc herniation spinal stenosis.  Status post L4-5 transforaminal interbody fusion/posterior lateral fusion insertion interbody device L4-5 decompression 09/08/2024 per Dr. Beuford.  Back brace when out of bed  -10/24--Dr. Letitia revised TLSO to LSO which he needs to wear when standing or ambulating   -back precautions ongoing 9.  Acute blood loss anemia.  Follow-up CBC 10/14- Pt has ~ 150-200cc out of VAC- not sure since when? Hb down 1/2 unit from 8.9 to 8.4-  will recheck Thursday  10/15- Hb will check in AM  10/17- Hb 9.0  10/27- Hb 9.9- VAC off 10.  AKI.  Resolved after IV hydration. 11.  Hyperlipidemia.  Crestor  20mg  daily 12.  COPD/remote tobacco use.  Monitor oxygen  saturations every shift 10/15- was on O2 this AM- per nursing from overnight only- but Sats 89-91%- getting CXR and ABG to see where he is- and getting sepsis work up.   10/16- started on Unasyn and will start Mucinex for coarse cough-   13.  History of prostate cancer/urinary retention.  History of status post prostatectomy.  External catheter currently in place.  Continue Flomax  0.4 mg daily 10/14- is voiding- but had urinary retention last time- will check a few bladder scans.  10/15 - pt needing in/out caths- had >1400cc in bladder- has cathing protocol now 10/16-10/17 has orders to bladder scan and in/out if volumes >350cc- requiring caths sometimes  10/18 may need to cath more frequently if he's having volumes >1000cc  10/19 cath volumes a little less yesterday--continue with current mgt  10/24 still having intermittent retention   -continue low dose urecholine at high volumes   -increase urecholine to 25mg  tid, continue flomax    -up to EOB/toilet to empty -10/24/24 no cath since yesterday 0625am, does best when EOB/commode, encouraged to continue trying that.  10/27- not requiring caths still 14.  History of detached retina left eye.  Follow-up outpatient ophthalmology. 15. Severe constipation 10/14- LBM at least 4-5 days ago per documentation- pt doesn't remember when went last- will get KUB- and order Sorbitol  30cc- and SSE if no results- having abd pain.  10/16- Pt had 1 liquid stool- but otherwise, no other stools in 6-7 days- last KUB shows on 10/14 was full of stool- will give Sorbitol  since likely has hard stool that liquid getting by- and SSE- if that doesn't work, will ask nursing to do Mg citrate.   10/17- had massive BM  per pt- per chart 1 large, 1 medium-    10/18 smear yesterday. Continue senna-s and increase miralax  to bid.  10/19 two small bm's today 10/24 had large T3 BM today but still feels constipated. Will increase senna-s to 3 tabs at bedtime -10/23/24 LBM this morning, type 5; monitor current regimen.  -10/24/24 LBM this morning x2, stated loose but documented type 5-- but pt says they're looser than he'd like.   -decrease senna to 1 tab nightly  -change miralax  to daily PRN -advised that if he starts having hard BMs or misses a day of BM, NEEDS TO ASK FOR MIRALAX  OR TO INCREASE HIS MEDS BACK 10/27- LBM yesterday not hard per pt  16.  Hypokalemia  10/14- K+ 3.2- will replete KCL 40 Meq x1. -- fine on 10/23 10/27- K+ 3.8 17. Low grade temp in setting of delirium  10/15- will get sepsis work up, ammonia level and ABG  10/17- has HCAP- Per #21 18. Cirrhosis on last Abd imaging  10/15- will get Ammonia as well esp with new jerking/tremors.  10/16- is actually shivering this AM, but isn't cold- Ammonia 16- not the cause of Sx's.   10/20- ALT is 47- slightly up but AST down to 37- can con't tylenol  as ordered  10/28- will check CMP next time  19. Infrarenal aortic aneurysm with mural thrombus- 6.8 cm on last imaging   20. Orthostatic hypotension 10/16- On Flomax , however No BP meds that I can reduce- will check with therapy- if dropping more, will add Midodrine- ordering ACE wrap on LLE  -addendum- adding Midodrine 5 mg TID starting at 630 am  10/19 bp's a little more robust over weekend thus far. No changes today  10/22-24 improved on midodrine 10/28- BP doing better on Midodrine- still a little soft, but better. Con't to monitor Vitals:   10/22/24 1722 10/22/24 1929 10/23/24 0509 10/23/24 1325  BP: 102/72 90/73 102/67 90/77   10/23/24 1943 10/24/24 0353 10/24/24 1423 10/24/24 1943  BP: 98/65 96/69 106/80 93/69   10/25/24 0450 10/25/24 1302 10/25/24 1920 10/26/24 0504  BP: 100/72 101/73 91/71 96/81     21. HCAP 10/16- on  Unasyn-  will con't for 5-6 days- monitor WBC- down to 12.7 from 13.2 last evening. Shivering could be due to breaking a temp>>?? 10/17- no tremors/shivering- will recheck CBC in AM- and make sure on right ABX-  and CMP due to LFTS/renal check on IV ABX- Added Albuterol  scheduled QID due to wheezing and bad COPD   10/20- WBC down to 8.2-   Unasyn completed    I spent a total of 39   minutes on total care today- >50% coordination of care- due to  D/w pt about his recovery- needs for doing stairs; and team conference today to f/u on progress- also d/w pt about AFO consult- and wound care for R heel, to take picture when they change dressings    LOS: 15 days A FACE TO FACE EVALUATION WAS PERFORMED  Toshio Slusher 10/26/2024, 8:55 AM

## 2024-10-27 DIAGNOSIS — T79A21A Traumatic compartment syndrome of right lower extremity, initial encounter: Secondary | ICD-10-CM | POA: Diagnosis not present

## 2024-10-27 NOTE — Progress Notes (Signed)
 Occupational Therapy Session Note  Patient Details  Name: David King MRN: [id number] Date of Birth: 12-18-1956  Today's Date: 10/27/2024 OT Individual Time: 8593-8567 OT Individual Time Calculation (min): 26 min    Short Term Goals: Week 2:  OT Short Term Goal 1 (Week 2): Patient to complete LB dressing with Mod A OT Short Term Goal 2 (Week 2): Patient to perform toileting with Mod A OT Short Term Goal 2 - Progress (Week 2): Progressing toward goal OT Short Term Goal 3 (Week 2): Patient to perform toilet transfer with Min assist OT Short Term Goal 4 (Week 2): Pt will don/doff TLSO brace with Mod A  Skilled Therapeutic Interventions/Progress Updates:   Pt greeted from direct OT handoff, no reports of pain during session. Pt engaged in table-top activity targeting unilateral support progressing to no UE support on RW for carryover into LB ADLs. Pt performs at [facility] to Min A (without UE support) for standing balance. Pt then completes x2 reps of forward/backward stepping over 6-in hurdle in preparation for walk-in shower transfer. Pt performs at [employer] A level with BUE supported on RW. Verbally reviewed sequencing for actual walk-in shower transfer, both patient and spouse receptive. Planning to place TTB into shower stall closer to knobs/handles for improved independence with water  control. Pt aware he will most likely need to sponge-bathe until LE wounds heal. Pt remained sitting in [facility] with spouse present and all immediate needs met.   Therapy Documentation Precautions:  Precautions Precautions: Fall, Back Precaution Booklet Issued: Yes (comment) Recall of Precautions/Restrictions: Impaired Precaution/Restrictions Comments: watch BP (orthostatic); RLE foot drop, wound VAC Required Braces or Orthoses: Spinal Brace Spinal Brace: Thoracolumbosacral orthotic, Applied in sitting position Restrictions Weight Bearing Restrictions Per Provider Order: No RLE Weight Bearing Per Provider Order:  Weight bearing as tolerated Other Position/Activity Restrictions: transfers only on RLE   Therapy/Group: Individual Therapy  Nereida Habermann, OTR/L, MSOT  10/27/2024, 4:00 PM

## 2024-10-27 NOTE — Progress Notes (Signed)
 Physical Therapy Session Note  Patient Details  Name: David King MRN: [id number] Date of Birth: March 16, 1956  Today's Date: 10/27/2024 PT Individual Time: 0830-0900 PT Individual Time Calculation (min): 30 min   Short Term Goals: Week 1:  PT Short Term Goal 1 (Week 1): Pt will tolerate sitting upright >2 hours PT Short Term Goal 1 - Progress (Week 1): Met PT Short Term Goal 2 (Week 1): Pt will perform stedy transfers with mod a or better PT Short Term Goal 2 - Progress (Week 1): Met PT Short Term Goal 3 (Week 1): Pt will consistently perform supine<>sit with CGA PT Short Term Goal 3 - Progress (Week 1): Met Week 2:  PT Short Term Goal 1 (Week 2): pt will tolerate standing >60 sec without orthostatic hypotension PT Short Term Goal 1 - Progress (Week 2): Met PT Short Term Goal 2 (Week 2): Pt will ambulate 30 ft or >with LRAD PT Short Term Goal 2 - Progress (Week 2): Met PT Short Term Goal 3 (Week 2): Pt will initiate transfer training with RW PT Short Term Goal 3 - Progress (Week 2): Met  Skilled Therapeutic Interventions/Progress Updates:    Pt in w/c, dressed and ready for PT this morning.  Pt reports 9/10 pain to RLE, states he had his pain meds this AM.  Pt wheeled self to main gym.  Worked on psychologist, counselling x3 trials.  2 & 3 trial with 1.5# ankle wts donned.  Pt demos good ascent/descent using x2 HR with CGA progressing to SBA.  Cueing to breathe during exertion, facilitating abdominal mm. Pt given rest breaks between each attempt.  Sit to stand ex's on mat slightly elevated x5.  Pt performed without use of AD, PT CGA/Min A to facilitate ant pelvic tilt to stand, pt demos good eccentric control; bilateral hand on thighs. Noted LE fatigue after second rep.  Pt passed off to [facility] for rest of session.    Therapy Documentation Precautions:  Precautions Precautions: Fall, Back Precaution Booklet Issued: Yes (comment) Recall of Precautions/Restrictions:  Impaired Precaution/Restrictions Comments: watch BP (orthostatic); RLE foot drop, wound VAC Required Braces or Orthoses: Spinal Brace Spinal Brace: Thoracolumbosacral orthotic, Applied in sitting position Restrictions Weight Bearing Restrictions Per Provider Order: No RLE Weight Bearing Per Provider Order: Weight bearing as tolerated Other Position/Activity Restrictions: transfers only on RLE    Pain:9/10 right leg     Therapy/Group: Individual Therapy  David King Fast 10/27/2024, 2:59 PM

## 2024-10-27 NOTE — Progress Notes (Signed)
 Physical Therapy Session Note  Patient Details  Name: David King MRN: [id number] Date of Birth: [birth year]  Today's Date: 10/27/2024 PT Individual Time: 0900-0935 PT Individual Time Calculation (min): 35 min   Short Term Goals: Week 1:  PT Short Term Goal 1 (Week 1): Pt will tolerate sitting upright >2 hours PT Short Term Goal 1 - Progress (Week 1): Met PT Short Term Goal 2 (Week 1): Pt will perform stedy transfers with mod a or better PT Short Term Goal 2 - Progress (Week 1): Met PT Short Term Goal 3 (Week 1): Pt will consistently perform supine<>sit with CGA PT Short Term Goal 3 - Progress (Week 1): Met  Skilled Therapeutic Interventions/Progress Updates:    Assuming care from previous PT. Treatment focusing on functional transitions from sitting<>standing. Tendency for wt shift to LLE and UE support when attempting without assistive device. Verbal and tactile cues for sequence and stability provided throughout session. Due to fatigue, surface elevated 1.5 inches to allow for successful transfer. NRE: progressing with static standing balance between standing attempts- increasing from 5 seconds to a final stand of 60 seconds (tactile cues provided for posture). Following sit<>stand repeats, pt transferred mat>w/c with CGA, no AD. Pt propelling w/c independently x 155 ft. Pt in room with all needs in reach following session.   Therapy Documentation Precautions:  Precautions Precautions: Fall, Back Precaution Booklet Issued: Yes (comment) Recall of Precautions/Restrictions: Impaired Precaution/Restrictions Comments: watch BP (orthostatic); RLE foot drop, wound VAC Required Braces or Orthoses: Spinal Brace Spinal Brace: Thoracolumbosacral orthotic, Applied in sitting position Restrictions Weight Bearing Restrictions Per Provider Order: No RLE Weight Bearing Per Provider Order: Weight bearing as tolerated Other Position/Activity Restrictions: transfers only on RLE    Pain: 8-9/10  in Rt LE - no intervention requested, monitored throughout session.     Therapy/Group: Individual Therapy  Lalah Durango 10/27/2024, 3:55 PM

## 2024-10-27 NOTE — Progress Notes (Signed)
 Occupational Therapy Session Note  Patient Details  Name: David King MRN: [id number] Date of Birth: March 20, 1956  Today's Date: 10/27/2024 OT Individual Time: 1309-1405 OT Individual Time Calculation (min): 56 min    Short Term Goals: Week 2:  OT Short Term Goal 1 (Week 2): Patient to complete LB dressing with Mod A OT Short Term Goal 2 (Week 2): Patient to perform toileting with Mod A OT Short Term Goal 2 - Progress (Week 2): Progressing toward goal OT Short Term Goal 3 (Week 2): Patient to perform toilet transfer with Min assist OT Short Term Goal 4 (Week 2): Pt will don/doff TLSO brace with Mod A  Skilled Therapeutic Interventions/Progress Updates:   Pt greeted seated in w/c, pt agreeable to OT intervention.     Of note, pt requested regular foam cushion for w/c as pt reported discomfort from roho, retrieved standard foam cushion.  Transfers/bed mobility/functional mobility:  Pt completed functional ambulation in gym ~ 10 ft with RW and CGA.    ADLs:  Transfers: ambulatory toilet transfer with RW and CGA.  Toileting: pt completed 3/3 toileting tasks with MODA with pt needing assistance only for pericare d/t back precautions. Continent bowel void.    Exercises:  Pt completed below therex in standing for UB strengthening and to challenge reactive balance with 3lb dowel rod: -bicep curls -chest presses  Pt needed MIN A overall for above therex for balance support.   Pt completed 1 min of toe taps with BUE support from Rw to 3 inch step for 1 min to challenge LB strength/endurance, 30 sec seated rest break then pt was able to complete step ups to 3 inch step with BUE support from RW. Pt needed MIN A overall for balance and MIN verbal cues for set- up and technique.   Pt completed 1 min of ball tosses in standing to nurse to challenge reactive balance. Pt reports increased fatigue and dizziness after task.  Bp- 111/75( 84)  HR 101 bpm                 Ended session with pt  seated in w/c, direct hand off to OT for next session.              Therapy Documentation Precautions:  Precautions Precautions: Fall, Back Precaution Booklet Issued: Yes (comment) Recall of Precautions/Restrictions: Impaired Precaution/Restrictions Comments: watch BP (orthostatic); RLE foot drop, wound VAC Required Braces or Orthoses: Spinal Brace Spinal Brace: Thoracolumbosacral orthotic, Applied in sitting position Restrictions Weight Bearing Restrictions Per Provider Order: No RLE Weight Bearing Per Provider Order: Weight bearing as tolerated Other Position/Activity Restrictions: transfers only on RLE  Pain: No pain   Therapy/Group: Individual Therapy  [name] 10/27/2024, 3:03 PM

## 2024-10-27 NOTE — Progress Notes (Signed)
 PROGRESS NOTE   Subjective/Complaints:  Pt reports doing well Was able to get pics on his own phone of the  R shin wounds.  Was able to go up and down 4 steps yesterday with very little assistance. Wife is at the [employer] helping a friend out.   Denies any specific issues- pain still spikes ~10x/day, but tolerable.     ROS: as per HPI   Pt denies SOB, abd pain, CP, N/V/C/D, and vision changes   Objective:   No results found.  Recent Labs    10/25/24 0437  WBC 7.5  HGB 9.9*  HCT 31.8*  PLT 245    Recent Labs    10/25/24 0437  NA 138  K 3.8  CL 104  CO2 24  GLUCOSE 104*  BUN 24*  CREATININE 1.03  CALCIUM  8.8*     Intake/Output Summary (Last 24 hours) at 10/27/2024 0940 Last data filed at 10/27/2024 0746 Gross per 24 hour  Intake 250 ml  Output 500 ml  Net -250 ml     Wound 10/11/24 1630 Pressure Injury Heel Right Unstageable - Full thickness tissue loss in which the base of the injury is covered by slough (yellow, tan, gray, green or brown) and/or eschar (tan, brown or black) in the wound bed. (Active)     Wound 10/22/24 0945 Pressure Injury Foot Right Stage 1 -  Intact skin with non-blanchable redness of a localized area usually over a bony prominence. (Active)    Physical Exam: Vital Signs Blood pressure 102/75, pulse 86, temperature 98.1 F (36.7 C), resp. rate 17, height 5' 9 (1.753 m), weight 86.2 kg, SpO2 97%.       General: awake, alert, appropriate, sitting up eating breakfast in w/c; NAD HENT: conjugate gaze; oropharynx moist CV: regular rate and rhythm; no JVD Pulmonary: CTA B/L; no W/R/R- good air movement GI: soft, NT, ND, (+)BS Psychiatric: appropriate- bright affect Neurological: Ox3  Ext: no clubbing, cyanosis, or edema although RLE_ assessed wounds /incisions via pics- sutures stretches across- but great granulation tissue- very superficial, healing well.   Psych: pleasant and cooperative   PRIOR EXAMS: General: Skin is warm. Wounds presenting still similar to below -vac removed from right lower leg                                   Neurological:     Comments: Patient is alert.     Oriented x 3 and follows commands. CN exam non-focal. Fair insight and awareness. MMT: BUE 5/5. LLE limited by  pain but appears to be moving all muscle groups. RLE limited by wounds/dressings, etc. Sensory exam normal for light touch and pain except for right foot where there is diminished LT along dorsum and to a lesser extent plantar aspect of foot.  No limb ataxia or cerebellar signs. No abnormal tone appreciated.     Musc: right heel cord tight- still about  minus 10-deg      Assessment/Plan: 1. Functional deficits which require 3+ hours per day of interdisciplinary therapy in a comprehensive inpatient rehab setting. Physiatrist is providing close team  supervision and 24 hour management of active medical problems listed below. Physiatrist and rehab team continue to assess barriers to discharge/monitor patient progress toward functional and medical goals  Care Tool:  Bathing    Body parts bathed by patient: Right arm, Left arm, Chest, Abdomen, Face   Body parts bathed by helper: Front perineal area, Buttocks, Right upper leg, Left lower leg, Right lower leg, Left upper leg     Bathing assist Assist Level: Total Assistance - Patient < 25%     Upper Body Dressing/Undressing Upper body dressing   What is the patient wearing?: Pull over shirt    Upper body assist Assist Level: Supervision/Verbal cueing    Lower Body Dressing/Undressing Lower body dressing      What is the patient wearing?: Pants, Orthosis     Lower body assist Assist for lower body dressing: Minimal Assistance - Patient > 75%     Toileting Toileting    Toileting assist Assist for toileting: Moderate Assistance - Patient 50 - 74%     Transfers Chair/bed  transfer  Transfers assist  Chair/bed transfer activity did not occur: Safety/medical concerns  Chair/bed transfer assist level: Supervision/Verbal cueing     Locomotion Ambulation   Ambulation assist   Ambulation activity did not occur: Safety/medical concerns  Assist level: 2 helpers Assistive device: Walker-rolling Max distance: 161' (total)   Walk 10 feet activity   Assist  Walk 10 feet activity did not occur: Safety/medical concerns  Assist level: 2 helpers Assistive device: Walker-rolling   Walk 50 feet activity   Assist Walk 50 feet with 2 turns activity did not occur: Safety/medical concerns  Assist level: 2 helpers Assistive device: Walker-rolling    Walk 150 feet activity   Assist Walk 150 feet activity did not occur: Safety/medical concerns  Assist level: 2 helpers Assistive device: Walker-rolling    Walk 10 feet on uneven surface  activity   Assist Walk 10 feet on uneven surfaces activity did not occur: Safety/medical concerns         Wheelchair     Assist Is the patient using a wheelchair?: Yes Type of Wheelchair: Manual Wheelchair activity did not occur: Safety/medical concerns         Wheelchair 50 feet with 2 turns activity    Assist    Wheelchair 50 feet with 2 turns activity did not occur: Safety/medical concerns       Wheelchair 150 feet activity     Assist  Wheelchair 150 feet activity did not occur: Safety/medical concerns       Blood pressure 102/75, pulse 86, temperature 98.1 F (36.7 C), resp. rate 17, height 5' 9 (1.753 m), weight 86.2 kg, SpO2 97%.  Medical Problem List and Plan: 1. Functional deficits secondary to PAD/compartment syndrome/right leg ischemia/status post thrombectomy.  Status post right leg anterior compartment fasciotomy 9/26 with multiple washouts as well as excisional debridement/wound VAC.  Patient is completing a course of doxycycline             -patient may not yet  shower             -ELOS/Goals: 11/11,  supervision goals  -ordered PRAFO to help stretch right heel cord  D/c 11/11 Con't CIR PT and OT AFO consult to be scheduled this week 2.  Antithrombotics: -DVT/anticoagulation:  Pharmaceutical: now on eliquis 5mg  BID             -antiplatelet therapy: Aspirin  81 mg daily 3. Pain Management: Lyrica  150 mg 3  times daily,  tylenol  prn.    10/22 doing well with tylenol  alone. Off all narcotics (prefers this) 10/24 pt doesn't want to increase lyrica  any further at this point (not too much higher it can go) 10/27- pain tolerable- spikes ~ 12x/day but going well otherwise with tylenol  alone 10/28-10/29-  pain mainly tolerable- doesn't want other meds 4. Mood/Behavior/Sleep: Cymbalta  30 mg nightly, Celexa  20 mg daily             -antipsychotic agents: N/A  -ambien  PRN 5. Neuropsych/cognition: This patient is capable of making decisions on his own behalf. 6. Skin/Wound Care: Routine skin checks -vac removed-->Vashe soaked gauze over wound,dry gauze, kerlix, ACE --daily change -local care and pressure relief as needed to multiple other wounds. On doxycycline             -prevalon boot/pressure relief, right heel wound  10/28- Said needed vinegar for a wound on R heel- will d/w nursing 7. Fluids/Electrolytes/Nutrition: Routine and analysis with follow-up chemistries, continue vitamins/supplements.   8. Lumbar radiculopathy/large disc herniation spinal stenosis.  Status post L4-5 transforaminal interbody fusion/posterior lateral fusion insertion interbody device L4-5 decompression 09/08/2024 per Dr. Beuford.  Back brace when out of bed  -10/24--Dr. Letitia revised TLSO to LSO which he needs to wear when standing or ambulating   -back precautions ongoing 9.  Acute blood loss anemia.  Follow-up CBC 10/14- Pt has ~ 150-200cc out of VAC- not sure since when? Hb down 1/2 unit from 8.9 to 8.4- will recheck Thursday  10/15- Hb will check in AM  10/17- Hb  9.0  10/27- Hb 9.9- VAC off 10.  AKI.  Resolved after IV hydration. 11.  Hyperlipidemia.  Crestor  20mg  daily 12.  COPD/remote tobacco use.  Monitor oxygen  saturations every shift 10/15- was on O2 this AM- per nursing from overnight only- but Sats 89-91%- getting CXR and ABG to see where he is- and getting sepsis work up.   10/16- started on Unasyn and will start Mucinex for coarse cough-   13.  History of prostate cancer/urinary retention.  History of status post prostatectomy.  External catheter currently in place.  Continue Flomax  0.4 mg daily 10/14- is voiding- but had urinary retention last time- will check a few bladder scans.  10/15 - pt needing in/out caths- had >1400cc in bladder- has cathing protocol now 10/16-10/17 has orders to bladder scan and in/out if volumes >350cc- requiring caths sometimes  10/18 may need to cath more frequently if he's having volumes >1000cc  10/19 cath volumes a little less yesterday--continue with current mgt  10/24 still having intermittent retention   -continue low dose urecholine at high volumes   -increase urecholine to 25mg  tid, continue flomax    -up to EOB/toilet to empty 10/29- no caths required- voiding well now 14.  History of detached retina left eye.  Follow-up outpatient ophthalmology. 15. Severe constipation 10/14- LBM at least 4-5 days ago per documentation- pt doesn't remember when went last- will get KUB- and order Sorbitol  30cc- and SSE if no results- having abd pain.  10/16- Pt had 1 liquid stool- but otherwise, no other stools in 6-7 days- last KUB shows on 10/14 was full of stool- will give Sorbitol  since likely has hard stool that liquid getting by- and SSE- if that doesn't work, will ask nursing to do Mg citrate.   10/17- had massive BM  per pt- per chart 1 large, 1 medium-   10/18 smear yesterday. Continue senna-s and increase miralax  to bid.  10/19 two small bm's today 10/24 had large T3 BM today but still feels constipated. Will  increase senna-s to 3 tabs at bedtime -10/23/24 LBM this morning, type 5; monitor current regimen.  -10/24/24 LBM this morning x2, stated loose but documented type 5-- but pt says they're looser than he'd like.   -decrease senna to 1 tab nightly  -change miralax  to daily PRN -advised that if he starts having hard BMs or misses a day of BM, NEEDS TO ASK FOR MIRALAX  OR TO INCREASE HIS MEDS BACK 10/27- LBM yesterday not hard per pt  10/29- Having Bms pretty much daily now, that off tylenol   16.  Hypokalemia  10/14- K+ 3.2- will replete KCL 40 Meq x1. -- fine on 10/23 10/27- K+ 3.8 17. Low grade temp in setting of delirium  10/15- will get sepsis work up, ammonia level and ABG  10/17- has HCAP- Per #21 18. Cirrhosis on last Abd imaging  10/15- will get Ammonia as well esp with new jerking/tremors.  10/16- is actually shivering this AM, but isn't cold- Ammonia 16- not the cause of Sx's.   10/20- ALT is 47- slightly up but AST down to 37- can con't tylenol  as ordered  10/28- will check CMP next time  19. Infrarenal aortic aneurysm with mural thrombus- 6.8 cm on last imaging   20. Orthostatic hypotension 10/16- On Flomax , however No BP meds that I can reduce- will check with therapy- if dropping more, will add Midodrine- ordering ACE wrap on LLE  -addendum- adding Midodrine 5 mg TID starting at 630 am  10/19 bp's a little more robust over weekend thus far. No changes today  10/22-24 improved on midodrine 10/28- BP doing better on Midodrine- still a little soft, but better. Con't to monitor Vitals:   10/23/24 1325 10/23/24 1943 10/24/24 0353 10/24/24 1423  BP: 90/77 98/65 96/69  106/80   10/24/24 1943 10/25/24 0450 10/25/24 1302 10/25/24 1920  BP: 93/69 100/72 101/73 91/71   10/26/24 0504 10/26/24 1506 10/26/24 2012 10/27/24 0545  BP: 96/81 110/76 94/68 102/75    21. HCAP 10/16- on Unasyn-  will con't for 5-6 days- monitor WBC- down to 12.7 from 13.2 last evening. Shivering could be due to  breaking a temp>>?? 10/17- no tremors/shivering- will recheck CBC in AM- and make sure on right ABX-  and CMP due to LFTS/renal check on IV ABX- Added Albuterol  scheduled QID due to wheezing and bad COPD   10/20- WBC down to 8.2-   Unasyn completed    I spent a total of 36   minutes on total care today- >50% coordination of care- due to  Reviewed pics of R anterior shin wounds; as well as d/w pt about function, wounds, and d/w nursing.   LOS: 16 days A FACE TO FACE EVALUATION WAS PERFORMED  Foster Frericks 10/27/2024, 9:40 AM

## 2024-10-27 NOTE — Progress Notes (Signed)
 Physical Therapy Session Note  Patient Details  Name: David King MRN: [id number] Date of Birth: 1956-03-21  Today's Date: 10/27/2024 PT Individual Time: 1000-1030 PT Individual Time Calculation (min): 30 min   Short Term Goals: Week 2:  PT Short Term Goal 1 (Week 2): pt will tolerate standing >60 sec without orthostatic hypotension PT Short Term Goal 1 - Progress (Week 2): Met PT Short Term Goal 2 (Week 2): Pt will ambulate 30 ft or >with LRAD PT Short Term Goal 2 - Progress (Week 2): Met PT Short Term Goal 3 (Week 2): Pt will initiate transfer training with RW PT Short Term Goal 3 - Progress (Week 2): Met  Skilled Therapeutic Interventions/Progress Updates: Pt presented  in w/c agreeable to therapy. Pt c/o 8/10 pain, no intervention requested. Session focused on education particularly in preparation for d/c. Initiated review of spinal binder, energy conservation, and current use of AFO. Pt pleased with progress currently and states anticipates AFO consult tomorrow. Pt did discuss significant discomfort with current cushion. Advised will obtain Roho cushion and leave in room for OT session at 1p. Pt agreeable to this. Pt left in w/c at end of session with call bell within reach and needs met.      Therapy Documentation Precautions:  Precautions Precautions: Fall, Back Precaution Booklet Issued: Yes (comment) Recall of Precautions/Restrictions: Impaired Precaution/Restrictions Comments: watch BP (orthostatic); RLE foot drop, wound VAC Required Braces or Orthoses: Spinal Brace Spinal Brace: Thoracolumbosacral orthotic, Applied in sitting position Restrictions Weight Bearing Restrictions Per Provider Order: No RLE Weight Bearing Per Provider Order: Weight bearing as tolerated Other Position/Activity Restrictions: transfers only on RLE General:   Vital Signs:   Pain:     Therapy/Group: Individual Therapy  Winford Hehn 10/27/2024, 12:39 PM

## 2024-10-28 DIAGNOSIS — T79A21A Traumatic compartment syndrome of right lower extremity, initial encounter: Secondary | ICD-10-CM | POA: Diagnosis not present

## 2024-10-28 NOTE — Progress Notes (Signed)
 Occupational Therapy Session Note  Patient Details  Name: David King MRN: [id number] Date of Birth: 1956-05-27  Today's Date: 10/28/2024 OT Individual Time: 0900-1000 OT Individual Time Calculation (min): 60 min    Short Term Goals: Week 1:  OT Short Term Goal 1 (Week 1): Patient to complete LB dressing with Mod A OT Short Term Goal 1 - Progress (Week 1): Progressing toward goal OT Short Term Goal 2 (Week 1): Patient to perform toileting with Mod A OT Short Term Goal 2 - Progress (Week 1): Progressing toward goal OT Short Term Goal 3 (Week 1): Patient to perform toilet transfer with Min assist OT Short Term Goal 3 - Progress (Week 1): Progressing toward goal OT Short Term Goal 4 (Week 1): Pt will don/doff TLSO brace with Mod A OT Short Term Goal 4 - Progress (Week 1): Progressing toward goal  Skilled Therapeutic Interventions/Progress Updates:    Pt seated EOB upon arrival. Skilled OT intervention with focus on sit<>stand, standing balance, and NuStep exercise for general conditioning. Sit<>stand with CGA. Standing balance with CGA/close supervision while pt placed and removed clothes pins from basketball net. Pt c/o increased dizziness/lightheadedness. BP 88/67 and 89/70 seated; 113/89 when standing. Pt reports condition slightly resolved and desired to keep going. NuStep 10 mins with rest break x 1 level 5 70 SPM. Pt transferred back to w/c and returned to room. Pt remained in w/c with all needs within reach.   Therapy Documentation Precautions:  Precautions Precautions: Fall, Back Precaution Booklet Issued: Yes (comment) Recall of Precautions/Restrictions: Impaired Precaution/Restrictions Comments: watch BP (orthostatic); RLE foot drop, wound VAC Required Braces or Orthoses: Spinal Brace Spinal Brace: Thoracolumbosacral orthotic, Applied in sitting position Restrictions Weight Bearing Restrictions Per Provider Order: Yes RLE Weight Bearing Per Provider Order: Weight  bearing as tolerated Other Position/Activity Restrictions: transfers only on RLE   Pain:  Pt reports intermittent RLE (foot) pain; resolves in 30 secs each occurrence   Therapy/Group: Individual Therapy  [name] 10/28/2024, 12:18 PM

## 2024-10-28 NOTE — Plan of Care (Signed)
  Problem: SCI BOWEL ELIMINATION Goal: RH STG MANAGE BOWEL WITH ASSISTANCE Description: STG Manage Bowel with supervision Assistance. Outcome: Progressing   Problem: SCI BLADDER ELIMINATION Goal: RH STG MANAGE BLADDER WITH ASSISTANCE Description: STG Manage Bladder With supervision Assistance Outcome: Progressing   Problem: RH SKIN INTEGRITY Goal: RH STG SKIN FREE OF INFECTION/BREAKDOWN Description: Manage skin free of infection with supervision assistance Outcome: Progressing   Problem: RH SAFETY Goal: RH STG ADHERE TO SAFETY PRECAUTIONS W/ASSISTANCE/DEVICE Description: STG Adhere to Safety Precautions With Assistance/Device. Outcome: Progressing   Problem: RH PAIN MANAGEMENT Goal: RH STG PAIN MANAGED AT OR BELOW PT'S PAIN GOAL Description: < 4 w/ prns Outcome: Progressing   Problem: RH KNOWLEDGE DEFICIT SCI Goal: RH STG INCREASE KNOWLEDGE OF SELF CARE AFTER SCI Description: Manage increase knowledge of self care after SCI with supervision assistance from wife using educational materials provided Outcome: Progressing   Problem: Education: Goal: Knowledge of General Education information will improve Description: Including pain rating scale, medication(s)/side effects and non-pharmacologic comfort measures Outcome: Progressing   Problem: Health Behavior/Discharge Planning: Goal: Ability to manage health-related needs will improve Outcome: Progressing   Problem: Clinical Measurements: Goal: Ability to maintain clinical measurements within normal limits will improve Outcome: Progressing Goal: Will remain free from infection Outcome: Progressing Goal: Diagnostic test results will improve Outcome: Progressing Goal: Respiratory complications will improve Outcome: Progressing Goal: Cardiovascular complication will be avoided Outcome: Progressing   Problem: Activity: Goal: Risk for activity intolerance will decrease Outcome: Progressing   Problem: Nutrition: Goal:  Adequate nutrition will be maintained Outcome: Progressing   Problem: Coping: Goal: Level of anxiety will decrease Outcome: Progressing   Problem: Elimination: Goal: Will not experience complications related to bowel motility Outcome: Progressing Goal: Will not experience complications related to urinary retention Outcome: Progressing   Problem: Pain Managment: Goal: General experience of comfort will improve and/or be controlled Outcome: Progressing   Problem: Safety: Goal: Ability to remain free from injury will improve Outcome: Progressing   Problem: Skin Integrity: Goal: Risk for impaired skin integrity will decrease Outcome: Progressing

## 2024-10-28 NOTE — Progress Notes (Signed)
 Physical Therapy Session Note  Patient Details  Name: David King MRN: [id number] Date of Birth: 01-Sep-1956  Today's Date: 10/28/2024 PT Individual Time: 1100-1200 PT Individual Time Calculation (min): 60 min   Short Term Goals: Week 1:  PT Short Term Goal 1 (Week 1): Pt will tolerate sitting upright >2 hours PT Short Term Goal 1 - Progress (Week 1): Met PT Short Term Goal 2 (Week 1): Pt will perform stedy transfers with mod a or better PT Short Term Goal 2 - Progress (Week 1): Met PT Short Term Goal 3 (Week 1): Pt will consistently perform supine<>sit with CGA PT Short Term Goal 3 - Progress (Week 1): Met Week 2:  PT Short Term Goal 1 (Week 2): pt will tolerate standing >60 sec without orthostatic hypotension PT Short Term Goal 1 - Progress (Week 2): Met PT Short Term Goal 2 (Week 2): Pt will ambulate 30 ft or >with LRAD PT Short Term Goal 2 - Progress (Week 2): Met PT Short Term Goal 3 (Week 2): Pt will initiate transfer training with RW PT Short Term Goal 3 - Progress (Week 2): Met  Skilled Therapeutic Interventions/Progress Updates:    Pt in w/c ready for PT session.  Pt wheeled self with UE ~x200' until fatigued, PT wheeled rest of way to main gym.  Pt performed stair negotiation with x2 HR 4 6 steps x3 trials with rest in between each trial. Pt demonstrating increased indep with CGA progressing to SBA cueing to posture correction, ab facilitation, breathing techniques. Cueing needed to increased hip flexion when transferring to w/c and not to reach prematurely. Pt able to return demo.  Gait tx with RW, w/c follow x145' with x4 rest breaks. Pt wheeled to day gym, transferred onto mat with CGA/S.  PT performed passive RLE HS stretching.  Pt returned to room, stayed up in w/c, all items within reach.   Therapy Documentation Precautions:  Precautions Precautions: Fall, Back Precaution Booklet Issued: Yes (comment) Recall of Precautions/Restrictions:  Impaired Precaution/Restrictions Comments: watch BP (orthostatic); RLE foot drop, wound VAC Required Braces or Orthoses: Spinal Brace Spinal Brace: Thoracolumbosacral orthotic, Applied in sitting position Restrictions Weight Bearing Restrictions Per Provider Order: Yes RLE Weight Bearing Per Provider Order: Weight bearing as tolerated Other Position/Activity Restrictions: transfers only on RLE  Therapy Vitals Temp: 98 F (36.7 C) Temp Source: Oral Pulse Rate: 92 Resp: 20 BP: 97/69 Patient Position (if appropriate): Lying Oxygen  Therapy SpO2: 95 % O2 Device: Room Air Patient Activity (if Appropriate): In bed Pulse Oximetry Type: Intermittent Pain:     Therapy/Group: Individual Therapy  [name] Fast 10/28/2024, 4:12 PM

## 2024-10-28 NOTE — Progress Notes (Signed)
 Physical Therapy Session Note  Patient Details  Name: David King MRN: [id number] Date of Birth: [birth year]  Today's Date: 10/28/2024 PT Individual Time: 1330-1430 PT Individual Time Calculation (min): 60 min   Short Term Goals: Week 2:  PT Short Term Goal 1 (Week 2): pt will tolerate standing >60 sec without orthostatic hypotension PT Short Term Goal 1 - Progress (Week 2): Met PT Short Term Goal 2 (Week 2): Pt will ambulate 30 ft or >with LRAD PT Short Term Goal 2 - Progress (Week 2): Met PT Short Term Goal 3 (Week 2): Pt will initiate transfer training with RW PT Short Term Goal 3 - Progress (Week 2): Met  Skilled Therapeutic Interventions/Progress Updates:    Pt recd in w/c as hand off from OT. Reports 4/10 pain, denies intervention.   Pt participated in AFO consult with hangar rep. Opted for posterior strut AFO. Discussed options for avoiding wounds. Plan to check fit with brace and larger shoes tomorrow.   Pt was limited by orthostatic hypotension this pm. Noted BP as low as 99/80 (87). Discussed replacing ted hose since OH has been more bothersome past few days.   Pt performed car transfer with CGA. Discussed pt's personal vehicle and planning for discharge.   Step ups 3 x 5 BIL for strength and endurance on 6 steps with CGA on RW, seated rest breaks for [facility] management. Pt then returned to room and to bed with CGA, supervision bed mobility. Donned prafo, was left with all needs in reach and alarm active.   Therapy Documentation Precautions:  Precautions Precautions: Fall, Back Precaution Booklet Issued: Yes (comment) Recall of Precautions/Restrictions: Impaired Precaution/Restrictions Comments: watch BP (orthostatic); RLE foot drop, wound VAC Required Braces or Orthoses: Spinal Brace Spinal Brace: Thoracolumbosacral orthotic, Applied in sitting position Restrictions Weight Bearing Restrictions Per Provider Order: Yes RLE Weight Bearing Per Provider Order: Weight bearing  as tolerated Other Position/Activity Restrictions: transfers only on RLE General:      Therapy/Group: Individual Therapy  [name] 10/28/2024, 2:12 PM

## 2024-10-28 NOTE — Progress Notes (Signed)
 Occupational Therapy Session Note  Patient Details  Name: David King MRN: [id number] Date of Birth: [birth year]  Today's Date: 10/28/2024 OT Individual Time: 1300-1330 OT Individual Time Calculation (min): 30 min    Short Term Goals: Week 2:  OT Short Term Goal 1 (Week 2): Patient to complete LB dressing with Mod A OT Short Term Goal 2 (Week 2): Patient to perform toileting with Mod A OT Short Term Goal 2 - Progress (Week 2): Progressing toward goal OT Short Term Goal 3 (Week 2): Patient to perform toilet transfer with Min assist OT Short Term Goal 4 (Week 2): Pt will don/doff TLSO brace with Mod A Week 3:  OT Short Term Goal 1 (Week 3): Patient to perform toileting with Mod A OT Short Term Goal 2 (Week 3): Pt will complete LB dressing with min A OT Short Term Goal 3 (Week 3): Pt will complete bathing tasks with min A using AE PRN  Skilled Therapeutic Interventions/Progress Updates:      Therapy Documentation Precautions:  Precautions Precautions: Fall, Back Precaution Booklet Issued: Yes (comment) Recall of Precautions/Restrictions: Impaired Precaution/Restrictions Comments: watch BP (orthostatic); RLE foot drop, wound VAC Required Braces or Orthoses: Spinal Brace Spinal Brace: Thoracolumbosacral orthotic, Applied in sitting position Restrictions Weight Bearing Restrictions Per Provider Order: Yes RLE Weight Bearing Per Provider Order: Weight bearing as tolerated Other Position/Activity Restrictions: transfers only on RLE General: Pt seated in W/C upon OT arrival, agreeable to OT.  Pain:  4/10 pain reported in RLE, activity, intermittent rest breaks, distractions provided for pain management, pt reports tolerable to proceed.   Exercises:  Pt completed the following exercise circuit in order to improve functional activity, strength and endurance to prepare for ADLs such as bathing. Pt completed the following exercises in EOB position with no noted LOB/SOB and 3x10  repetitions on each exercise with red theraband: -seated resistive leg marches - resisted clam shells - resisted knee flexion   Pt seated in W/C at end of session with W/C alarm donned, call light within reach and 4Ps assessed.    Therapy/Group: Individual Therapy  Camie Hoe, OTD, OTR/L 10/28/2024, 4:28 PM

## 2024-10-28 NOTE — Progress Notes (Signed)
 PROGRESS NOTE   Subjective/Complaints:  Had a pian spike just now-  Having BM's daily.  Tremors- come and go in hands- doesn't want weighted utensils.  Slept OK.  Hasn't received labs yet- planned but still as 10:52 am haven't been drawn yet.     ROS: as per HPI   Pt denies SOB, abd pain, CP, N/V/C/D, and vision changes   Objective:   No results found.  No results for input(s): WBC, HGB, HCT, PLT in the last 72 hours.   No results for input(s): NA, K, CL, CO2, GLUCOSE, BUN, CREATININE, CALCIUM  in the last 72 hours.    Intake/Output Summary (Last 24 hours) at 10/28/2024 1046 Last data filed at 10/28/2024 0800 Gross per 24 hour  Intake 236 ml  Output --  Net 236 ml     Wound 10/11/24 1630 Pressure Injury Heel Right Unstageable - Full thickness tissue loss in which the base of the injury is covered by slough (yellow, tan, gray, green or brown) and/or eschar (tan, brown or black) in the wound bed. (Active)     Wound 10/22/24 0945 Pressure Injury Foot Right Stage 1 -  Intact skin with non-blanchable redness of a localized area usually over a bony prominence. (Active)    Physical Exam: Vital Signs Blood pressure 113/74, pulse (!) 102, temperature 98.3 F (36.8 C), resp. rate 18, height 5' 9 (1.753 m), weight 86.2 kg, SpO2 97%.        General: awake, alert, appropriate, sitting up eating breakfast; NAD HENT: conjugate gaze; oropharynx moist CV: regular rate and rhythm- rate in 90's when listened; no JVD Pulmonary: CTA B/L; no W/R/R- good air movement GI: soft, NT, ND, (+)BS- normoactive Psychiatric: appropriate- bright once pain spike passed Neurological: Ox3   Ext: no clubbing, cyanosis, or edema although RLE_ assessed wounds /incisions via pics- sutures stretches across- but great granulation tissue- very superficial, healing well.  Psych: pleasant and cooperative   PRIOR  EXAMS: General: Skin is warm. Wounds presenting still similar to below -vac removed from right lower leg                                   Neurological:     Comments: Patient is alert.     Oriented x 3 and follows commands. CN exam non-focal. Fair insight and awareness. MMT: BUE 5/5. LLE limited by  pain but appears to be moving all muscle groups. RLE limited by wounds/dressings, etc. Sensory exam normal for light touch and pain except for right foot where there is diminished LT along dorsum and to a lesser extent plantar aspect of foot.  No limb ataxia or cerebellar signs. No abnormal tone appreciated.     Musc: right heel cord tight- still about  minus 10-deg      Assessment/Plan: 1. Functional deficits which require 3+ hours per day of interdisciplinary therapy in a comprehensive inpatient rehab setting. Physiatrist is providing close team supervision and 24 hour management of active medical problems listed below. Physiatrist and rehab team continue to assess barriers to discharge/monitor patient progress toward functional and medical goals  Care  Tool:  Bathing    Body parts bathed by patient: Right arm, Left arm, Chest, Abdomen, Face   Body parts bathed by helper: Front perineal area, Buttocks, Right upper leg, Left lower leg, Right lower leg, Left upper leg     Bathing assist Assist Level: Total Assistance - Patient < 25%     Upper Body Dressing/Undressing Upper body dressing   What is the patient wearing?: Pull over shirt    Upper body assist Assist Level: Supervision/Verbal cueing    Lower Body Dressing/Undressing Lower body dressing      What is the patient wearing?: Pants, Orthosis     Lower body assist Assist for lower body dressing: Minimal Assistance - Patient > 75%     Toileting Toileting    Toileting assist Assist for toileting: Moderate Assistance - Patient 50 - 74%     Transfers Chair/bed transfer  Transfers assist  Chair/bed  transfer activity did not occur: Safety/medical concerns  Chair/bed transfer assist level: Supervision/Verbal cueing     Locomotion Ambulation   Ambulation assist   Ambulation activity did not occur: Safety/medical concerns  Assist level: 2 helpers Assistive device: Walker-rolling Max distance: 161' (total)   Walk 10 feet activity   Assist  Walk 10 feet activity did not occur: Safety/medical concerns  Assist level: 2 helpers Assistive device: Walker-rolling   Walk 50 feet activity   Assist Walk 50 feet with 2 turns activity did not occur: Safety/medical concerns  Assist level: 2 helpers Assistive device: Walker-rolling    Walk 150 feet activity   Assist Walk 150 feet activity did not occur: Safety/medical concerns  Assist level: 2 helpers Assistive device: Walker-rolling    Walk 10 feet on uneven surface  activity   Assist Walk 10 feet on uneven surfaces activity did not occur: Safety/medical concerns         Wheelchair     Assist Is the patient using a wheelchair?: Yes Type of Wheelchair: Manual Wheelchair activity did not occur: Safety/medical concerns  Wheelchair assist level: Supervision/Verbal cueing Max wheelchair distance: >200'    Wheelchair 50 feet with 2 turns activity    Assist    Wheelchair 50 feet with 2 turns activity did not occur: Safety/medical concerns   Assist Level: Supervision/Verbal cueing   Wheelchair 150 feet activity     Assist  Wheelchair 150 feet activity did not occur: Safety/medical concerns   Assist Level: Supervision/Verbal cueing   Blood pressure 113/74, pulse (!) 102, temperature 98.3 F (36.8 C), resp. rate 18, height 5' 9 (1.753 m), weight 86.2 kg, SpO2 97%.  Medical Problem List and Plan: 1. Functional deficits secondary to PAD/compartment syndrome/right leg ischemia/status post thrombectomy.  Status post right leg anterior compartment fasciotomy 9/26 with multiple washouts as well as  excisional debridement/wound VAC.  Patient is completing a course of doxycycline             -patient may not yet shower             -ELOS/Goals: 11/11,  supervision goals  -ordered PRAFO to help stretch right heel cord  D/c 11/11 Con't CIR PT and OT AFO consult to be scheduled this week 2.  Antithrombotics: -DVT/anticoagulation:  Pharmaceutical: now on eliquis 5mg  BID             -antiplatelet therapy: Aspirin  81 mg daily 3. Pain Management: Lyrica  150 mg 3 times daily,  tylenol  prn.    10/22 doing well with tylenol  alone. Off all narcotics (prefers this) 10/24  pt doesn't want to increase lyrica  any further at this point (not too much higher it can go) 10/27- pain tolerable- spikes ~ 12x/day but going well otherwise with tylenol  alone 10/28-10/29-  pain mainly tolerable- doesn't want other meds 4. Mood/Behavior/Sleep: Cymbalta  30 mg nightly, Celexa  20 mg daily             -antipsychotic agents: N/A  -ambien  PRN 5. Neuropsych/cognition: This patient is capable of making decisions on his own behalf. 6. Skin/Wound Care: Routine skin checks -vac removed-->Vashe soaked gauze over wound,dry gauze, kerlix, ACE --daily change -local care and pressure relief as needed to multiple other wounds. On doxycycline             -prevalon boot/pressure relief, right heel wound  10/28- Said needed vinegar for a wound on R heel- will d/w nursing  10/30- assessed RLE wounds- yesterday-  looking better and heel stable 7. Fluids/Electrolytes/Nutrition: Routine and analysis with follow-up chemistries, continue vitamins/supplements.   8. Lumbar radiculopathy/large disc herniation spinal stenosis.  Status post L4-5 transforaminal interbody fusion/posterior lateral fusion insertion interbody device L4-5 decompression 09/08/2024 per Dr. Beuford.  Back brace when out of bed  -10/24--Dr. Letitia revised TLSO to LSO which he needs to wear when standing or ambulating   -back precautions ongoing 9.  Acute blood loss  anemia.  Follow-up CBC 10/14- Pt has ~ 150-200cc out of VAC- not sure since when? Hb down 1/2 unit from 8.9 to 8.4- will recheck Thursday  10/15- Hb will check in AM  10/17- Hb 9.0  10/27- Hb 9.9- VAC off 10.  AKI.  Resolved after IV hydration. 11.  Hyperlipidemia.  Crestor  20mg  daily 12.  COPD/remote tobacco use.  Monitor oxygen  saturations every shift 10/15- was on O2 this AM- per nursing from overnight only- but Sats 89-91%- getting CXR and ABG to see where he is- and getting sepsis work up.   10/16- started on Unasyn and will start Mucinex for coarse cough-   13.  History of prostate cancer/urinary retention.  History of status post prostatectomy.  External catheter currently in place.  Continue Flomax  0.4 mg daily 10/14- is voiding- but had urinary retention last time- will check a few bladder scans.  10/15 - pt needing in/out caths- had >1400cc in bladder- has cathing protocol now 10/16-10/17 has orders to bladder scan and in/out if volumes >350cc- requiring caths sometimes  10/18 may need to cath more frequently if he's having volumes >1000cc  10/19 cath volumes a little less yesterday--continue with current mgt  10/24 still having intermittent retention   -continue low dose urecholine at high volumes   -increase urecholine to 25mg  tid, continue flomax    -up to EOB/toilet to empty 10/29- no caths required- voiding well now 14.  History of detached retina left eye.  Follow-up outpatient ophthalmology. 15. Severe constipation 10/14- LBM at least 4-5 days ago per documentation- pt doesn't remember when went last- will get KUB- and order Sorbitol  30cc- and SSE if no results- having abd pain.  10/16- Pt had 1 liquid stool- but otherwise, no other stools in 6-7 days- last KUB shows on 10/14 was full of stool- will give Sorbitol  since likely has hard stool that liquid getting by- and SSE- if that doesn't work, will ask nursing to do Mg citrate.   10/17- had massive BM  per pt- per chart 1  large, 1 medium-   10/18 smear yesterday. Continue senna-s and increase miralax  to bid. 10/29- Having Bms pretty much daily now, that  off tylenol   16.  Hypokalemia  10/14- K+ 3.2- will replete KCL 40 Meq x1. -- fine on 10/23 10/27- K+ 3.8 17. Low grade temp in setting of delirium  10/15- will get sepsis work up, ammonia level and ABG  10/17- has HCAP- Per #21 18. Cirrhosis on last Abd imaging  10/15- will get Ammonia as well esp with new jerking/tremors.  10/16- is actually shivering this AM, but isn't cold- Ammonia 16- not the cause of Sx's.   10/20- ALT is 47- slightly up but AST down to 37- can con't tylenol  as ordered  10/28- will check CMP next time 10/30- changed to tomorrow- hasn't been drawn yet this AM at of 11am  19. Infrarenal aortic aneurysm with mural thrombus- 6.8 cm on last imaging   20. Orthostatic hypotension 10/16- On Flomax , however No BP meds that I can reduce- will check with therapy- if dropping more, will add Midodrine- ordering ACE wrap on LLE  -addendum- adding Midodrine 5 mg TID starting at 630 am  10/19 bp's a little more robust over weekend thus far. No changes today  10/22-24 improved on midodrine 10/28- 10/30- BP doing better on Midodrine- still a little soft, but better. Con't to monitor Vitals:   10/24/24 1423 10/24/24 1943 10/25/24 0450 10/25/24 1302  BP: 106/80 93/69 100/72 101/73   10/25/24 1920 10/26/24 0504 10/26/24 1506 10/26/24 2012  BP: 91/71 96/81 110/76 94/68   10/27/24 0545 10/27/24 1715 10/27/24 2016 10/28/24 0536  BP: 102/75 102/72 98/72 113/74    21. HCAP 10/16- on Unasyn-  will con't for 5-6 days- monitor WBC- down to 12.7 from 13.2 last evening. Shivering could be due to breaking a temp>>?? 10/17- no tremors/shivering- will recheck CBC in AM- and make sure on right ABX-  and CMP due to LFTS/renal check on IV ABX- Added Albuterol  scheduled QID due to wheezing and bad COPD   10/20- WBC down to 8.2-   Unasyn completed     I spent a  total of 41  minutes on total care today- >50% coordination of care- due to  D/w nursing coordinator about pics for wounds- as well as weighted utensils, etc- wants to be normal- also d/w pt about pain- reviewed orders about CBC/etc   LOS: 17 days A FACE TO FACE EVALUATION WAS PERFORMED  Marieliz Strang 10/28/2024, 10:46 AM

## 2024-10-29 ENCOUNTER — Inpatient Hospital Stay (HOSPITAL_COMMUNITY)

## 2024-10-29 DIAGNOSIS — T79A21A Traumatic compartment syndrome of right lower extremity, initial encounter: Secondary | ICD-10-CM | POA: Diagnosis not present

## 2024-10-29 LAB — COMPREHENSIVE METABOLIC PANEL WITH GFR
ALT: 32 U/L (ref 0–44)
AST: 29 U/L (ref 15–41)
Albumin: 2.9 g/dL — ABNORMAL LOW (ref 3.5–5.0)
Alkaline Phosphatase: 74 U/L (ref 38–126)
Anion gap: 11 (ref 5–15)
BUN: 25 mg/dL — ABNORMAL HIGH (ref 8–23)
CO2: 23 mmol/L (ref 22–32)
Calcium: 8.7 mg/dL — ABNORMAL LOW (ref 8.9–10.3)
Chloride: 100 mmol/L (ref 98–111)
Creatinine, Ser: 1.17 mg/dL (ref 0.61–1.24)
GFR, Estimated: >60 mL/min (ref >60)
Glucose, Bld: 126 mg/dL — ABNORMAL HIGH (ref 70–99)
Potassium: 3.7 mmol/L (ref 3.5–5.1)
Sodium: 134 mmol/L — ABNORMAL LOW (ref 135–145)
Total Bilirubin: 0.7 mg/dL (ref 0.0–1.2)
Total Protein: 6.7 g/dL (ref 6.5–8.1)

## 2024-10-29 LAB — CBC WITH DIFFERENTIAL/PLATELET
Abs Immature Granulocytes: 0.07 K/uL (ref 0.00–0.07)
Basophils Absolute: 0.1 K/uL (ref 0.0–0.1)
Basophils Relative: 1 %
Eosinophils Absolute: 0.5 K/uL (ref 0.0–0.5)
Eosinophils Relative: 5 %
HCT: 33.2 % — ABNORMAL LOW (ref 39.0–52.0)
Hemoglobin: 10.5 g/dL — ABNORMAL LOW (ref 13.0–17.0)
Immature Granulocytes: 1 %
Lymphocytes Relative: 17 %
Lymphs Abs: 1.4 K/uL (ref 0.7–4.0)
MCH: 30.2 pg (ref 26.0–34.0)
MCHC: 31.6 g/dL (ref 30.0–36.0)
MCV: 95.4 fL (ref 80.0–100.0)
Monocytes Absolute: 0.6 K/uL (ref 0.1–1.0)
Monocytes Relative: 7 %
Neutro Abs: 5.9 K/uL (ref 1.7–7.7)
Neutrophils Relative %: 69 %
Platelets: 243 K/uL (ref 150–400)
RBC: 3.48 MIL/uL — ABNORMAL LOW (ref 4.22–5.81)
RDW: 18.6 % — ABNORMAL HIGH (ref 11.5–15.5)
WBC: 8.5 K/uL (ref 4.0–10.5)
nRBC: 0 % (ref 0.0–0.2)

## 2024-10-29 MED ORDER — MIDODRINE HCL 5 MG PO TABS
10.0000 mg | ORAL_TABLET | Freq: Three times a day (TID) | ORAL | Status: DC
  Administered 2024-10-29 – 2024-11-05 (×22): 10 mg via ORAL
  Filled 2024-10-29 (×22): qty 2

## 2024-10-29 MED ORDER — SODIUM CHLORIDE 0.9 % IV SOLN: INTRAVENOUS | Status: AC

## 2024-10-29 NOTE — Progress Notes (Signed)
 Occupational Therapy Session Note  Patient Details  Name: David King MRN: [id number] Date of Birth: 02-23-1956  Today's Date: 10/29/2024 OT Individual Time: 0700-0815 OT Individual Time Calculation (min): 75 min    Short Term Goals: Week 2:  OT Short Term Goal 1 (Week 2): Patient to complete LB dressing with Mod A OT Short Term Goal 2 (Week 2): Patient to perform toileting with Mod A OT Short Term Goal 2 - Progress (Week 2): Progressing toward goal OT Short Term Goal 3 (Week 2): Patient to perform toilet transfer with Min assist OT Short Term Goal 4 (Week 2): Pt will don/doff TLSO brace with Mod A  Skilled Therapeutic Interventions/Progress Updates:    Skilled OT intervention with focus on dressing with sit<>stand from EOB and toileting to increase independence with BADLs. UB dressing seated EOB with supervision including donning LSO. Pt used reacher to thread BLE into pants. Sit<>stand to pull pants over hips. Donning pants with CGA. No reports of dizziness. Pt required assistance to don socks/shoes without AE. Amb with RW to bathroom to use toilet. Mod A for toileting tasks. Foot rests swapped out for elevating foot rests. Pt reamianed in w/c with wll needs within reach.   Therapy Documentation Precautions:  Precautions Precautions: Fall, Back Precaution Booklet Issued: Yes (comment) Recall of Precautions/Restrictions: Impaired Precaution/Restrictions Comments: watch BP (orthostatic); RLE foot drop, wound VAC Required Braces or Orthoses: Spinal Brace Spinal Brace: Thoracolumbosacral orthotic, Applied in sitting position Restrictions Weight Bearing Restrictions Per Provider Order: No RLE Weight Bearing Per Provider Order: Weight bearing as tolerated Other Position/Activity Restrictions: transfers only on RLE   Pain: Pt reports 8/10 ramping up to 10/10 RLE (foot) pain; pain meds requested   Therapy/Group: Individual Therapy  [name] 10/29/2024, 12:17 PM

## 2024-10-29 NOTE — Progress Notes (Signed)
 PROGRESS NOTE   Subjective/Complaints:  Pt reports still not getting vinegar soaks- not sure why- d/w nursing- will do them- or let charge nurse know.   Concerned that low BP is getting in way of therapy- TEDs can only do on L leg- and same if did  ACE wraps Also admits drinking 4-6 cups/water /day.     ROS: as per HPI   Pt denies SOB, abd pain, CP, N/V/C/D, and vision changes  Low BP (+)   Objective:   No results found.  No results for input(s): WBC, HGB, HCT, PLT in the last 72 hours.   No results for input(s): NA, K, CL, CO2, GLUCOSE, BUN, CREATININE, CALCIUM  in the last 72 hours.    Intake/Output Summary (Last 24 hours) at 10/29/2024 0830 Last data filed at 10/28/2024 2102 Gross per 24 hour  Intake 368 ml  Output --  Net 368 ml     Wound 10/11/24 1630 Pressure Injury Heel Right Unstageable - Full thickness tissue loss in which the base of the injury is covered by slough (yellow, tan, gray, green or brown) and/or eschar (tan, brown or black) in the wound bed. (Active)     Wound 10/22/24 0945 Pressure Injury Foot Right Stage 1 -  Intact skin with non-blanchable redness of a localized area usually over a bony prominence. (Active)    Physical Exam: Vital Signs Blood pressure 107/74, pulse 89, temperature 97.6 F (36.4 C), resp. rate 16, height 5' 9 (1.753 m), weight 86.2 kg, SpO2 96%.         General: awake, alert, appropriate, sitting up in bed; OT in room; NAD HENT: conjugate gaze; oropharynx moist CV: regular rate and rhythm; no JVD Pulmonary: CTA B/L; no W/R/R- good air movement GI: soft, NT, ND, (+)BS Psychiatric: appropriate Neurological: Ox3  Ext: no clubbing, cyanosis, or edema although RLE_ assessed wounds /incisions via pics- sutures stretches across- but great granulation tissue- very superficial, healing well.  Assessed pics of R heel and leg- as below Psych:  pleasant and cooperative   PRIOR EXAMS: General: Skin is warm. Wounds presenting still similar to below -vac removed from right lower leg         Neurological:     Comments: Patient is alert.     Oriented x 3 and follows commands. CN exam non-focal. Fair insight and awareness. MMT: BUE 5/5. LLE limited by  pain but appears to be moving all muscle groups. RLE limited by wounds/dressings, etc. Sensory exam normal for light touch and pain except for right foot where there is diminished LT along dorsum and to a lesser extent plantar aspect of foot.  No limb ataxia or cerebellar signs. No abnormal tone appreciated.     Musc: right heel cord tight- still about  minus 10-deg      Assessment/Plan: 1. Functional deficits which require 3+ hours per day of interdisciplinary therapy in a comprehensive inpatient rehab setting. Physiatrist is providing close team supervision and 24 hour management of active medical problems listed below. Physiatrist and rehab team continue to assess barriers to discharge/monitor patient progress toward functional and medical goals  Care Tool:  Bathing    Body parts bathed by  patient: Right arm, Left arm, Chest, Abdomen, Face   Body parts bathed by helper: Front perineal area, Buttocks, Right upper leg, Left lower leg, Right lower leg, Left upper leg     Bathing assist Assist Level: Total Assistance - Patient < 25%     Upper Body Dressing/Undressing Upper body dressing   What is the patient wearing?: Pull over shirt    Upper body assist Assist Level: Supervision/Verbal cueing    Lower Body Dressing/Undressing Lower body dressing      What is the patient wearing?: Pants, Orthosis     Lower body assist Assist for lower body dressing: Minimal Assistance - Patient > 75%     Toileting Toileting    Toileting assist Assist for toileting: Moderate Assistance - Patient 50 - 74%     Transfers Chair/bed transfer  Transfers assist  Chair/bed transfer  activity did not occur: Safety/medical concerns  Chair/bed transfer assist level: Supervision/Verbal cueing     Locomotion Ambulation   Ambulation assist   Ambulation activity did not occur: Safety/medical concerns  Assist level: 2 helpers Assistive device: Walker-rolling Max distance: 145'   Walk 10 feet activity   Assist  Walk 10 feet activity did not occur: Safety/medical concerns  Assist level: 2 helpers Assistive device: Walker-rolling   Walk 50 feet activity   Assist Walk 50 feet with 2 turns activity did not occur: Safety/medical concerns  Assist level: 2 helpers Assistive device: Walker-rolling    Walk 150 feet activity   Assist Walk 150 feet activity did not occur: Safety/medical concerns  Assist level: 2 helpers Assistive device: Walker-rolling    Walk 10 feet on uneven surface  activity   Assist Walk 10 feet on uneven surfaces activity did not occur: Safety/medical concerns         Wheelchair     Assist Is the patient using a wheelchair?: Yes Type of Wheelchair: Manual Wheelchair activity did not occur: Safety/medical concerns  Wheelchair assist level: Supervision/Verbal cueing Max wheelchair distance: >200'    Wheelchair 50 feet with 2 turns activity    Assist    Wheelchair 50 feet with 2 turns activity did not occur: Safety/medical concerns   Assist Level: Supervision/Verbal cueing   Wheelchair 150 feet activity     Assist  Wheelchair 150 feet activity did not occur: Safety/medical concerns   Assist Level: Supervision/Verbal cueing   Blood pressure 107/74, pulse 89, temperature 97.6 F (36.4 C), resp. rate 16, height 5' 9 (1.753 m), weight 86.2 kg, SpO2 96%.  Medical Problem List and Plan: 1. Functional deficits secondary to PAD/compartment syndrome/right leg ischemia/status post thrombectomy.  Status post right leg anterior compartment fasciotomy 9/26 with multiple washouts as well as excisional  debridement/wound VAC.  Patient is completing a course of doxycycline             -patient may not yet shower             -ELOS/Goals: 11/11,  supervision goals  -ordered PRAFO to help stretch right heel cord  D/c 11/11 Con't CIR PT and OT AFO consult to be scheduled this week D/w nursing about vinegar soaks- also d/w nursing care coordinator 2.  Antithrombotics: -DVT/anticoagulation:  Pharmaceutical: now on eliquis 5mg  BID             -antiplatelet therapy: Aspirin  81 mg daily 3. Pain Management: Lyrica  150 mg 3 times daily,  tylenol  prn.    10/22 doing well with tylenol  alone. Off all narcotics (prefers this) 10/24 pt doesn't  want to increase lyrica  any further at this point (not too much higher it can go) 10/27- pain tolerable- spikes ~ 12x/day but going well otherwise with tylenol  alone 10/28-10/31  pain mainly tolerable- doesn't want other meds 4. Mood/Behavior/Sleep: Cymbalta  30 mg nightly, Celexa  20 mg daily             -antipsychotic agents: N/A  -ambien  PRN 5. Neuropsych/cognition: This patient is capable of making decisions on his own behalf. 6. Skin/Wound Care: Routine skin checks -vac removed-->Vashe soaked gauze over wound,dry gauze, kerlix, ACE --daily change -local care and pressure relief as needed to multiple other wounds. On doxycycline             -prevalon boot/pressure relief, right heel wound  10/28- Said needed vinegar for a wound on R heel- will d/w nursing  10/30- assessed RLE wounds- yesterday-  looking better and heel stable  10/31- went over pics- heel about the same- asked nursing to get vinegar soaks 7. Fluids/Electrolytes/Nutrition: Routine and analysis with follow-up chemistries, continue vitamins/supplements.   8. Lumbar radiculopathy/large disc herniation spinal stenosis.  Status post L4-5 transforaminal interbody fusion/posterior lateral fusion insertion interbody device L4-5 decompression 09/08/2024 per Dr. Beuford.  Back brace when out of bed   -10/24--Dr. Letitia revised TLSO to LSO which he needs to wear when standing or ambulating   -back precautions ongoing 9.  Acute blood loss anemia.  Follow-up CBC 10/14- Pt has ~ 150-200cc out of VAC- not sure since when? Hb down 1/2 unit from 8.9 to 8.4- will recheck Thursday  10/15- Hb will check in AM  10/17- Hb 9.0  10/27- Hb 9.9- VAC off 10.  AKI.  Resolved after IV hydration. 11.  Hyperlipidemia.  Crestor  20mg  daily 12.  COPD/remote tobacco use.  Monitor oxygen  saturations every shift 10/15- was on O2 this AM- per nursing from overnight only- but Sats 89-91%- getting CXR and ABG to see where he is- and getting sepsis work up.   10/16- started on Unasyn and will start Mucinex for coarse cough-   13.  History of prostate cancer/urinary retention.  History of status post prostatectomy.  External catheter currently in place.  Continue Flomax  0.4 mg daily 10/14- is voiding- but had urinary retention last time- will check a few bladder scans.  10/15 - pt needing in/out caths- had >1400cc in bladder- has cathing protocol now 10/16-10/17 has orders to bladder scan and in/out if volumes >350cc- requiring caths sometimes  10/18 may need to cath more frequently if he's having volumes >1000cc  10/19 cath volumes a little less yesterday--continue with current mgt  10/24 still having intermittent retention   -continue low dose urecholine at high volumes   -increase urecholine to 25mg  tid, continue flomax    -up to EOB/toilet to empty 10/29- no caths required- voiding well now 14.  History of detached retina left eye.  Follow-up outpatient ophthalmology. 15. Severe constipation 10/14- LBM at least 4-5 days ago per documentation- pt doesn't remember when went last- will get KUB- and order Sorbitol  30cc- and SSE if no results- having abd pain.  10/16- Pt had 1 liquid stool- but otherwise, no other stools in 6-7 days- last KUB shows on 10/14 was full of stool- will give Sorbitol  since likely has hard  stool that liquid getting by- and SSE- if that doesn't work, will ask nursing to do Mg citrate.   10/17- had massive BM  per pt- per chart 1 large, 1 medium-   10/18 smear yesterday. Continue senna-s  and increase miralax  to bid. 10/29-10/31- Having Bms pretty much daily now, that off tylenol   16.  Hypokalemia  10/14- K+ 3.2- will replete KCL 40 Meq x1. -- fine on 10/23 10/27- K+ 3.8 17. Low grade temp in setting of delirium  10/15- will get sepsis work up, ammonia level and ABG  10/17- has HCAP- Per #21 18. Cirrhosis on last Abd imaging  10/15- will get Ammonia as well esp with new jerking/tremors.  10/16- is actually shivering this AM, but isn't cold- Ammonia 16- not the cause of Sx's.   10/20- ALT is 47- slightly up but AST down to 37- can con't tylenol  as ordered  10/28- will check CMP next time 10/30- changed to tomorrow- hasn't been drawn yet this AM at of 11am  10/31- Labs not done yet from yesterday or today- checking with nursing as to why 19. Infrarenal aortic aneurysm with mural thrombus- 6.8 cm on last imaging   20. Orthostatic hypotension 10/16- On Flomax , however No BP meds that I can reduce- will check with therapy- if dropping more, will add Midodrine- ordering ACE wrap on LLE  -addendum- adding Midodrine 5 mg TID starting at 630 am  10/19 bp's a little more robust over weekend thus far. No changes today  10/22-24 improved on midodrine 10/28- 10/30- BP doing better on Midodrine- still a little soft, but better. Con't to monitor 10/31- will increase midodrine to 10 mg TID_ and changed times to 630, 1030 and 230-  Vitals:   10/26/24 1506 10/26/24 2012 10/27/24 0545 10/27/24 1715  BP: 110/76 94/68 102/75 102/72   10/27/24 2016 10/28/24 0536 10/28/24 1300 10/28/24 1315  BP: 98/72 113/74 92/67 101/73   10/28/24 1540 10/28/24 1958 10/28/24 2359 10/29/24 0452  BP: 97/69 (!) 109/56 99/74 107/74    21. HCAP 10/16- on Unasyn-  will con't for 5-6 days- monitor WBC- down to 12.7  from 13.2 last evening. Shivering could be due to breaking a temp>>?? 10/17- no tremors/shivering- will recheck CBC in AM- and make sure on right ABX-  and CMP due to LFTS/renal check on IV ABX- Added Albuterol  scheduled QID due to wheezing and bad COPD   10/20- WBC down to 8.2-   Unasyn completed     I spent a total of 41    minutes on total care today- >50% coordination of care- due to  D/w nursing, nurse coordinator and pt about wounds, and BP- also with OT- reviewed orders and vitals  LOS: 18 days A FACE TO FACE EVALUATION WAS PERFORMED  Tammatha Cobb 10/29/2024, 8:30 AM

## 2024-10-29 NOTE — Progress Notes (Signed)
 Physical Therapy Session Note  Patient Details  Name: David King MRN: [id number] Date of Birth: 11/07/1956  Today's Date: 10/29/2024 PT Individual Time: 1030-1100 PT Individual Time Calculation (min): 30 min   Short Term Goals: Week 3:  PT Short Term Goal 1 (Week 3): pt will ambulate >100 ft with CGA PT Short Term Goal 2 (Week 3): pt will navigate x 8 steps PT Short Term Goal 3 (Week 3): pt will perform STS with supervision consistently  Skilled Therapeutic Interventions/Progress Updates: Pt presented in w/c agreeable to therapy. Pt states pain more controlled than earlier this am (delay in pain meds). Pt also indicated continues to have lightheadedness. Vitals checked BP 103/75 (85) HR 94. PTA provided continued education on continuing hydration and possibility of keeping up hydration by using water  pitcher. Pt transported to day room for time management and participated in Cybex Kinetron 40cm/sec x 5 min for increased cardiovascular activity with BP checked after activity 99/73 (83) HR 92. Pt transported back to room at end of session and remained in w/c with call bell within reach and needs met.      Therapy Documentation Precautions:  Precautions Precautions: Fall, Back Precaution Booklet Issued: Yes (comment) Recall of Precautions/Restrictions: Impaired Precaution/Restrictions Comments: watch BP (orthostatic); RLE foot drop, wound VAC Required Braces or Orthoses: Spinal Brace Spinal Brace: Thoracolumbosacral orthotic, Applied in sitting position Restrictions Weight Bearing Restrictions Per Provider Order: No RLE Weight Bearing Per Provider Order: Weight bearing as tolerated Other Position/Activity Restrictions: transfers only on RLE General:   Vital Signs: Therapy Vitals Temp: 97.8 F (36.6 C) Temp Source: Oral Pulse Rate: 86 Resp: 18 BP: 91/68 Patient Position (if appropriate): Lying Oxygen  Therapy SpO2: 92 % O2 Device: Room Air   Therapy/Group: Individual  Therapy  Andric Kerce 10/29/2024, 4:20 PM

## 2024-10-29 NOTE — Progress Notes (Signed)
 Physical Therapy Session Note  Patient Details  Name: David King MRN: [id number] Date of Birth: 06/05/1956  Today's Date: 10/29/2024 PT Individual Time: 0900-1000 PT Individual Time Calculation (min): 60 min   Short Term Goals: Week 2:  PT Short Term Goal 1 (Week 2): pt will tolerate standing >60 sec without orthostatic hypotension PT Short Term Goal 1 - Progress (Week 2): Met PT Short Term Goal 2 (Week 2): Pt will ambulate 30 ft or >with LRAD PT Short Term Goal 2 - Progress (Week 2): Met PT Short Term Goal 3 (Week 2): Pt will initiate transfer training with RW PT Short Term Goal 3 - Progress (Week 2): Met  Skilled Therapeutic Interventions/Progress Updates:    Session 1: Pt seated in w/c on arrival and agreeable to therapy. Pt reports high pain levels this am, premedicated. Rest and positioning provided as needed. Pt reports improved by end of session.   Pt propelled w/c with BUE throughout session for endurance and functional mobility. Session focused on LE strength via Sit to stand practice without UE support on RW, using wither knees or arm rests. Pt also performed combo Sit to stand +marches, but began to feel dizzy after 1-2 reps. BP initially WNL, but standing reading =83/66(73). Pt returned to room at end of session and remained seated up in W/c with need sin reach.  Session 2: pt received in bed and agreeable to therapy. No complaint of pain. Pt with IV running at this time, so therapist managed IV pole throughout session.    Discussed need for staff in the room before standing. Pt expressed understanding.  Bed mob with supervision, Stand pivot transfer with CGA. Pt propelled w/c with BUE with supervision. Pt then participated in x 1 set of squats for strength with CGA. Progressed to lunges with RW for strength and dynamic balance. X 2 bouts of gait ~50 ft, with min a, no LOB or knee buckling noted.  Pt utilized nustep x 10 min on rolling hills profile, load interval 4-9 for  global strength and endurance. Pt returned to room after session, was left with all needs in reach and alarm active.   Therapy Documentation Precautions:  Precautions Precautions: Fall, Back Precaution Booklet Issued: Yes (comment) Recall of Precautions/Restrictions: Impaired Precaution/Restrictions Comments: watch BP (orthostatic); RLE foot drop, wound VAC Required Braces or Orthoses: Spinal Brace Spinal Brace: Thoracolumbosacral orthotic, Applied in sitting position Restrictions Weight Bearing Restrictions Per Provider Order: No RLE Weight Bearing Per Provider Order: Weight bearing as tolerated Other Position/Activity Restrictions: transfers only on RLE General:    Therapy/Group: Individual Therapy  Knoxx Boeding C Mccall Will 10/29/2024, 8:55 AM

## 2024-10-29 NOTE — Progress Notes (Deleted)
 Physical Therapy Session Note  Patient Details  Name: David King MRN: [id number] Date of Birth: 04-17-1956  Today's Date: 10/29/2024 PT Individual Time: 1030-1100 PT Individual Time Calculation (min): 30 min   Short Term Goals: Week 3:  PT Short Term Goal 1 (Week 3): pt will ambulate >100 ft with CGA PT Short Term Goal 2 (Week 3): pt will navigate x 8 steps PT Short Term Goal 3 (Week 3): pt will perform STS with supervision consistently  Skilled Therapeutic Interventions/Progress Updates: Pt presented in w/c agreeable to therapy. Pt c/o significant muscular pain in R shoulder. Session focused on education and introduction of binder. Pt receptive to education, however also voiced frustration at difference between acute care and CIR due to limitations of movement. PTA provided active listening and provided education on importance of safety in this setting. PTA also performed STM to R upper trap and sub occipital release with pt stating significant relief. Pt left in bed at end of session with bed alarm on, call bell within reach and needs met.      Therapy Documentation Precautions:  Precautions Precautions: Fall, Back Precaution Booklet Issued: Yes (comment) Recall of Precautions/Restrictions: Impaired Precaution/Restrictions Comments: watch BP (orthostatic); RLE foot drop, wound VAC Required Braces or Orthoses: Spinal Brace Spinal Brace: Thoracolumbosacral orthotic, Applied in sitting position Restrictions Weight Bearing Restrictions Per Provider Order: No RLE Weight Bearing Per Provider Order: Weight bearing as tolerated Other Position/Activity Restrictions: transfers only on RLE General:   Vital Signs: Therapy Vitals Temp: 97.8 F (36.6 C) Temp Source: Oral Pulse Rate: 88 Resp: 16 BP: 107/82 Patient Position (if appropriate): Lying Oxygen  Therapy SpO2: 98 % O2 Device: Room Air Pain: Pain Assessment Pain Score: 2    Therapy/Group: Individual Therapy  [name] 10/29/2024, 12:36 PM

## 2024-10-30 DIAGNOSIS — K5901 Slow transit constipation: Secondary | ICD-10-CM | POA: Diagnosis not present

## 2024-10-30 DIAGNOSIS — I951 Orthostatic hypotension: Secondary | ICD-10-CM | POA: Diagnosis not present

## 2024-10-30 DIAGNOSIS — T79A21D Traumatic compartment syndrome of right lower extremity, subsequent encounter: Secondary | ICD-10-CM | POA: Diagnosis not present

## 2024-10-30 NOTE — Plan of Care (Signed)
  Problem: SCI BOWEL ELIMINATION Goal: RH STG MANAGE BOWEL WITH ASSISTANCE Description: STG Manage Bowel with supervision Assistance. Outcome: Progressing   Problem: SCI BLADDER ELIMINATION Goal: RH STG MANAGE BLADDER WITH ASSISTANCE Description: STG Manage Bladder With supervision Assistance Outcome: Progressing   Problem: RH SKIN INTEGRITY Goal: RH STG SKIN FREE OF INFECTION/BREAKDOWN Description: Manage skin free of infection with supervision assistance Outcome: Progressing   Problem: RH SAFETY Goal: RH STG ADHERE TO SAFETY PRECAUTIONS W/ASSISTANCE/DEVICE Description: STG Adhere to Safety Precautions With Assistance/Device. Outcome: Progressing   Problem: RH PAIN MANAGEMENT Goal: RH STG PAIN MANAGED AT OR BELOW PT'S PAIN GOAL Description: < 4 w/ prns Outcome: Progressing   Problem: RH KNOWLEDGE DEFICIT SCI Goal: RH STG INCREASE KNOWLEDGE OF SELF CARE AFTER SCI Description: Manage increase knowledge of self care after SCI with supervision assistance from wife using educational materials provided Outcome: Progressing   Problem: Education: Goal: Knowledge of General Education information will improve Description: Including pain rating scale, medication(s)/side effects and non-pharmacologic comfort measures Outcome: Progressing   Problem: Health Behavior/Discharge Planning: Goal: Ability to manage health-related needs will improve Outcome: Progressing   Problem: Clinical Measurements: Goal: Ability to maintain clinical measurements within normal limits will improve Outcome: Progressing Goal: Will remain free from infection Outcome: Progressing Goal: Diagnostic test results will improve Outcome: Progressing Goal: Respiratory complications will improve Outcome: Progressing Goal: Cardiovascular complication will be avoided Outcome: Progressing   Problem: Activity: Goal: Risk for activity intolerance will decrease Outcome: Progressing   Problem: Nutrition: Goal:  Adequate nutrition will be maintained Outcome: Progressing   Problem: Coping: Goal: Level of anxiety will decrease Outcome: Progressing   Problem: Elimination: Goal: Will not experience complications related to bowel motility Outcome: Progressing Goal: Will not experience complications related to urinary retention Outcome: Progressing   Problem: Pain Managment: Goal: General experience of comfort will improve and/or be controlled Outcome: Progressing   Problem: Safety: Goal: Ability to remain free from injury will improve Outcome: Progressing   Problem: Skin Integrity: Goal: Risk for impaired skin integrity will decrease Outcome: Progressing

## 2024-10-30 NOTE — Progress Notes (Signed)
 PROGRESS NOTE   Subjective/Complaints:  Pt doing well, slept well, pain manageable, LBM overnight and again this AM after eval, urinating fine, urinating fine. No ohter complaints or concerns.     ROS: as per HPI   Pt denies SOB, abd pain, CP, N/V/C/D, and vision changes  Low BP (+)   Objective:   CT HEAD WO CONTRAST ( ) Result Date: 10/29/2024 CLINICAL DATA:  Initial evaluation for acute trauma, fall. EXAM: CT HEAD WITHOUT CONTRAST TECHNIQUE: Contiguous axial images were obtained from the base of the skull through the vertex without intravenous contrast. RADIATION DOSE REDUCTION: This exam was performed according to the departmental dose-optimization program which includes automated exposure control, adjustment of the mA and/or kV according to patient size and/or use of iterative reconstruction technique. COMPARISON:  Prior MRI from 09/14/2024. FINDINGS: Brain: Cerebral volume within normal limits. Mild chronic microvascular ischemic disease. No acute intracranial hemorrhage. No acute large vessel territory infarct. No mass lesion or midline shift. No hydrocephalus or extra-axial fluid collection. Vascular: No abnormal hyperdense vessel. Calcified atherosclerosis present at skull base. Skull: Scalp soft tissues within normal limits.  Calvarium intact. Sinuses/Orbits: Postoperative changes noted at the left globe. Prior bilateral ocular lens replacement. Mild chronic left sphenoid sinus disease noted. Paranasal sinuses are otherwise clear. No significant mastoid effusion. Other: None. IMPRESSION: 1. No acute intracranial abnormality. 2. Mild chronic microvascular ischemic disease. Electronically Signed   By: Morene Hoard M.D.   On: 10/29/2024 18:58    Recent Labs    10/29/24 1439  WBC 8.5  HGB 10.5*  HCT 33.2*  PLT 243     Recent Labs    10/29/24 1439  NA 134*  K 3.7  CL 100  CO2 23  GLUCOSE 126*  BUN 25*   CREATININE 1.17  CALCIUM  8.7*      Intake/Output Summary (Last 24 hours) at 10/30/2024 0955 Last data filed at 10/30/2024 0750 Gross per 24 hour  Intake 2433.32 ml  Output 1000 ml  Net 1433.32 ml     Wound 10/11/24 1630 Pressure Injury Heel Right Unstageable - Full thickness tissue loss in which the base of the injury is covered by slough (yellow, tan, gray, green or brown) and/or eschar (tan, brown or black) in the wound bed. (Active)     Wound 10/22/24 0945 Pressure Injury Foot Right Stage 1 -  Intact skin with non-blanchable redness of a localized area usually over a bony prominence. (Active)    Physical Exam: Vital Signs Blood pressure 111/67, pulse 85, temperature 98.2 F (36.8 C), resp. rate 18, height 5' 9 (1.753 m), weight 86.2 kg, SpO2 97%.    General: awake, alert, appropriate, sitting up in bed; trying to change shirt; NAD HENT: conjugate gaze; oropharynx moist CV: regular rate and rhythm; no JVD Pulmonary: CTA B/L; no W/R/R- good air movement GI: soft, NT, ND, (+)BS Psychiatric: appropriate, cooperative Neurological: Ox3 Ext: no clubbing, cyanosis, or edema although RLE ACE wrapped so hard to visualize any edema    PRIOR EXAMS: General: Skin is warm. Wounds presenting still similar to below -vac removed from right lower leg -assessed wounds /incisions via pics- sutures stretches across- but great granulation tissue- very  superficial, healing well.  Assessed pics of R heel and leg- as below         Neurological:     Comments: Patient is alert.     Oriented x 3 and follows commands. CN exam non-focal. Fair insight and awareness. MMT: BUE 5/5. LLE limited by  pain but appears to be moving all muscle groups. RLE limited by wounds/dressings, etc. Sensory exam normal for light touch and pain except for right foot where there is diminished LT along dorsum and to a lesser extent plantar aspect of foot.  No limb ataxia or cerebellar signs. No abnormal tone appreciated.      Musc: right heel cord tight- still about  minus 10-deg      Assessment/Plan: 1. Functional deficits which require 3+ hours per day of interdisciplinary therapy in a comprehensive inpatient rehab setting. Physiatrist is providing close team supervision and 24 hour management of active medical problems listed below. Physiatrist and rehab team continue to assess barriers to discharge/monitor patient progress toward functional and medical goals  Care Tool:  Bathing    Body parts bathed by patient: Right arm, Left arm, Chest, Abdomen, Face   Body parts bathed by helper: Front perineal area, Buttocks, Right upper leg, Left lower leg, Right lower leg, Left upper leg     Bathing assist Assist Level: Total Assistance - Patient < 25%     Upper Body Dressing/Undressing Upper body dressing   What is the patient wearing?: Pull over shirt, Orthosis    Upper body assist Assist Level: Supervision/Verbal cueing    Lower Body Dressing/Undressing Lower body dressing      What is the patient wearing?: Pants     Lower body assist Assist for lower body dressing: Minimal Assistance - Patient > 75%     Toileting Toileting    Toileting assist Assist for toileting: Moderate Assistance - Patient 50 - 74%     Transfers Chair/bed transfer  Transfers assist  Chair/bed transfer activity did not occur: Safety/medical concerns  Chair/bed transfer assist level: Supervision/Verbal cueing     Locomotion Ambulation   Ambulation assist   Ambulation activity did not occur: Safety/medical concerns  Assist level: 2 helpers Assistive device: Walker-rolling Max distance: 145'   Walk 10 feet activity   Assist  Walk 10 feet activity did not occur: Safety/medical concerns  Assist level: 2 helpers Assistive device: Walker-rolling   Walk 50 feet activity   Assist Walk 50 feet with 2 turns activity did not occur: Safety/medical concerns  Assist level: 2 helpers Assistive device:  Walker-rolling    Walk 150 feet activity   Assist Walk 150 feet activity did not occur: Safety/medical concerns  Assist level: 2 helpers Assistive device: Walker-rolling    Walk 10 feet on uneven surface  activity   Assist Walk 10 feet on uneven surfaces activity did not occur: Safety/medical concerns         Wheelchair     Assist Is the patient using a wheelchair?: Yes Type of Wheelchair: Manual Wheelchair activity did not occur: Safety/medical concerns  Wheelchair assist level: Supervision/Verbal cueing Max wheelchair distance: >200'    Wheelchair 50 feet with 2 turns activity    Assist    Wheelchair 50 feet with 2 turns activity did not occur: Safety/medical concerns   Assist Level: Supervision/Verbal cueing   Wheelchair 150 feet activity     Assist  Wheelchair 150 feet activity did not occur: Safety/medical concerns   Assist Level: Supervision/Verbal cueing   Blood pressure  111/67, pulse 85, temperature 98.2 F (36.8 C), resp. rate 18, height 5' 9 (1.753 m), weight 86.2 kg, SpO2 97%.  Medical Problem List and Plan: 1. Functional deficits secondary to PAD/compartment syndrome/right leg ischemia/status post thrombectomy.  Status post right leg anterior compartment fasciotomy 9/26 with multiple washouts as well as excisional debridement/wound VAC.  Patient is completing a course of doxycycline             -patient may not yet shower             -ELOS/Goals: 11/11,  supervision goals  -ordered PRAFO to help stretch right heel cord  D/c 11/11 Con't CIR PT and OT AFO consult to be scheduled this week D/w nursing about vinegar soaks- also d/w nursing care coordinator 2.  Antithrombotics: -DVT/anticoagulation:  Pharmaceutical: now on eliquis 5mg  BID             -antiplatelet therapy: Aspirin  81 mg daily 3. Pain Management: Lyrica  150 mg 3 times daily,  tylenol  prn.    10/22 doing well with tylenol  alone. Off all narcotics (prefers this) 10/24 pt  doesn't want to increase lyrica  any further at this point (not too much higher it can go) 10/27- pain tolerable- spikes ~ 12x/day but going well otherwise with tylenol  alone 10/28-10/31  pain mainly tolerable- doesn't want other meds 4. Mood/Behavior/Sleep: Cymbalta  30 mg nightly, Celexa  20 mg daily             -antipsychotic agents: N/A  -ambien  PRN 5. Neuropsych/cognition: This patient is capable of making decisions on his own behalf. 6. Skin/Wound Care: Routine skin checks -vac removed-->Vashe soaked gauze over wound,dry gauze, kerlix, ACE --daily change -local care and pressure relief as needed to multiple other wounds. On doxycycline             -prevalon boot/pressure relief, right heel wound  10/28- Said needed vinegar for a wound on R heel- will d/w nursing  10/30- assessed RLE wounds- yesterday-  looking better and heel stable 10/31- went over pics- heel about the same- asked nursing to get vinegar soaks 7. Fluids/Electrolytes/Nutrition: Routine and analysis with follow-up chemistries, continue vitamins/supplements.   8. Lumbar radiculopathy/large disc herniation spinal stenosis.  Status post L4-5 transforaminal interbody fusion/posterior lateral fusion insertion interbody device L4-5 decompression 09/08/2024 per Dr. Beuford.  Back brace when out of bed  -10/24--Dr. Letitia revised TLSO to LSO which he needs to wear when standing or ambulating   -back precautions ongoing 9.  Acute blood loss anemia.  Follow-up CBC 10/14- Pt has ~ 150-200cc out of VAC- not sure since when? Hb down 1/2 unit from 8.9 to 8.4- will recheck Thursday  10/15- Hb will check in AM  10/17- Hb 9.0  10/27- Hb 9.9- VAC off--> up to 10.5 10/31 10.  AKI.  Resolved after IV hydration. 11.  Hyperlipidemia.  Crestor  20mg  daily 12.  COPD/remote tobacco use.  Monitor oxygen  saturations every shift 10/15- was on O2 this AM- per nursing from overnight only- but Sats 89-91%- getting CXR and ABG to see where he is- and  getting sepsis work up.   10/16- started on Unasyn and will start Mucinex for coarse cough-   13.  History of prostate cancer/urinary retention.  History of status post prostatectomy.  External catheter currently in place.  Continue Flomax  0.4 mg daily 10/14- is voiding- but had urinary retention last time- will check a few bladder scans.  10/15 - pt needing in/out caths- had >1400cc in bladder- has cathing protocol  now 10/16-10/17 has orders to bladder scan and in/out if volumes >350cc- requiring caths sometimes  10/18 may need to cath more frequently if he's having volumes >1000cc  10/19 cath volumes a little less yesterday--continue with current mgt  10/24 still having intermittent retention   -continue low dose urecholine at high volumes   -increase urecholine to 25mg  tid, continue flomax    -up to EOB/toilet to empty 10/29-11/1 no caths required- voiding well now 14.  History of detached retina left eye.  Follow-up outpatient ophthalmology. 15. Severe constipation 10/14- LBM at least 4-5 days ago per documentation- pt doesn't remember when went last- will get KUB- and order Sorbitol  30cc- and SSE if no results- having abd pain.  10/16- Pt had 1 liquid stool- but otherwise, no other stools in 6-7 days- last KUB shows on 10/14 was full of stool- will give Sorbitol  since likely has hard stool that liquid getting by- and SSE- if that doesn't work, will ask nursing to do Mg citrate.   10/17- had massive BM  per pt- per chart 1 large, 1 medium-   10/18 smear yesterday. Continue senna-s and increase miralax  to bid. 10/29-10/31- Having Bms pretty much daily now, that off tylenol   -10/30/24 LBM this morning 16.  Hypokalemia  10/14- K+ 3.2- will replete KCL 40 Meq x1. -- fine on 10/23 10/27- K+ 3.8 17. Low grade temp in setting of delirium  10/15- will get sepsis work up, ammonia level and ABG  10/17- has HCAP- Per #21 18. Cirrhosis on last Abd imaging  10/15- will get Ammonia as well esp with  new jerking/tremors.  10/16- is actually shivering this AM, but isn't cold- Ammonia 16- not the cause of Sx's.   10/20- ALT is 47- slightly up but AST down to 37- can con't tylenol  as ordered  10/28- will check CMP next time 10/30- changed to tomorrow- hasn't been drawn yet this AM at of 11am  10/31- Labs not done yet from yesterday or today- checking with nursing as to why-- LFTs normalized 19. Infrarenal aortic aneurysm with mural thrombus- 6.8 cm on last imaging   20. Orthostatic hypotension 10/16- On Flomax , however No BP meds that I can reduce- will check with therapy- if dropping more, will add Midodrine- ordering ACE wrap on LLE  -addendum- adding Midodrine 5 mg TID starting at 630 am  10/19 bp's a little more robust over weekend thus far. No changes today  10/22-24 improved on midodrine 10/28- 10/30- BP doing better on Midodrine- still a little soft, but better. Con't to monitor 10/31- will increase midodrine to 10 mg TID_ and changed times to 630, 1030 and 230-  -10/30/24 BPs stable, occasionally soft, has fluids running @100ml /hr Vitals:   10/27/24 2016 10/28/24 0536 10/28/24 1300 10/28/24 1315  BP: 98/72 113/74 92/67 101/73   10/28/24 1540 10/28/24 1958 10/28/24 2359 10/29/24 0452  BP: 97/69 (!) 109/56 99/74 107/74   10/29/24 1222 10/29/24 1554 10/29/24 2151 10/30/24 0534  BP: 107/82 91/68 120/67 111/67    21. HCAP 10/16- on Unasyn-  will con't for 5-6 days- monitor WBC- down to 12.7 from 13.2 last evening. Shivering could be due to breaking a temp>>?? 10/17- no tremors/shivering- will recheck CBC in AM- and make sure on right ABX-  and CMP due to LFTS/renal check on IV ABX- Added Albuterol  scheduled QID due to wheezing and bad COPD   10/20- WBC down to 8.2-   Unasyn completed      LOS: 19 days A FACE  TO FACE EVALUATION WAS PERFORMED  [address] 10/30/2024, 9:55 AM

## 2024-10-30 NOTE — Consult Note (Signed)
 WOC Nurse Consult Note: Reason for Consult: right heel wound Patient known to have US  PI on the right heel at the time of admission. He has US  PI on the left elbow as well  Wound type: see above Pressure Injury POA: Yes Measurement: see nursing flow sheets Wound bed: both 100% black, stable  Drainage (amount, consistency, odor)none Periwound: intact  Dressing procedure/placement/frequency:  Paint right heel and elbow wound with betadine , allow to air dry. Offload both at all times.  Prevalon boots in place already    Re consult if needed, will not follow at this time. Thanks  Arijana Narayan [employer], RN,CWOCN, CNS, [employer] [phone]

## 2024-10-31 DIAGNOSIS — K5901 Slow transit constipation: Secondary | ICD-10-CM | POA: Diagnosis not present

## 2024-10-31 DIAGNOSIS — I951 Orthostatic hypotension: Secondary | ICD-10-CM | POA: Diagnosis not present

## 2024-10-31 DIAGNOSIS — T79A21D Traumatic compartment syndrome of right lower extremity, subsequent encounter: Secondary | ICD-10-CM | POA: Diagnosis not present

## 2024-10-31 NOTE — Progress Notes (Signed)
 PROGRESS NOTE   Subjective/Complaints:  Pt doing well again, slept well, pain manageable, LBM yesterday, urinating fine. No ohter complaints or concerns.     ROS: as per HPI   Pt denies SOB, abd pain, CP, N/V/C/D, and vision changes  Low BP (+)   Objective:   CT HEAD WO CONTRAST ( ) Result Date: 10/29/2024 CLINICAL DATA:  Initial evaluation for acute trauma, fall. EXAM: CT HEAD WITHOUT CONTRAST TECHNIQUE: Contiguous axial images were obtained from the base of the skull through the vertex without intravenous contrast. RADIATION DOSE REDUCTION: This exam was performed according to the departmental dose-optimization program which includes automated exposure control, adjustment of the mA and/or kV according to patient size and/or use of iterative reconstruction technique. COMPARISON:  Prior MRI from 09/14/2024. FINDINGS: Brain: Cerebral volume within normal limits. Mild chronic microvascular ischemic disease. No acute intracranial hemorrhage. No acute large vessel territory infarct. No mass lesion or midline shift. No hydrocephalus or extra-axial fluid collection. Vascular: No abnormal hyperdense vessel. Calcified atherosclerosis present at skull base. Skull: Scalp soft tissues within normal limits.  Calvarium intact. Sinuses/Orbits: Postoperative changes noted at the left globe. Prior bilateral ocular lens replacement. Mild chronic left sphenoid sinus disease noted. Paranasal sinuses are otherwise clear. No significant mastoid effusion. Other: None. IMPRESSION: 1. No acute intracranial abnormality. 2. Mild chronic microvascular ischemic disease. Electronically Signed   By: Morene Hoard M.D.   On: 10/29/2024 18:58    Recent Labs    10/29/24 1439  WBC 8.5  HGB 10.5*  HCT 33.2*  PLT 243     Recent Labs    10/29/24 1439  NA 134*  K 3.7  CL 100  CO2 23  GLUCOSE 126*  BUN 25*  CREATININE 1.17  CALCIUM  8.7*       Intake/Output Summary (Last 24 hours) at 10/31/2024 1104 Last data filed at 10/31/2024 9146 Gross per 24 hour  Intake 525 ml  Output 1250 ml  Net -725 ml     Wound 10/11/24 1630 Pressure Injury Heel Right Unstageable - Full thickness tissue loss in which the base of the injury is covered by slough (yellow, tan, gray, green or brown) and/or eschar (tan, brown or black) in the wound bed. (Active)     Wound 10/22/24 0945 Pressure Injury Foot Right Stage 1 -  Intact skin with non-blanchable redness of a localized area usually over a bony prominence. (Active)    Physical Exam: Vital Signs Blood pressure 104/75, pulse 91, temperature 98.2 F (36.8 C), temperature source Oral, resp. rate 16, height 5' 9 (1.753 m), weight 86.2 kg, SpO2 97%.    General: awake, alert, appropriate, sitting up in w/c; NAD HENT: conjugate gaze; oropharynx moist CV: regular rate and rhythm; no JVD Pulmonary: CTA B/L; no W/R/R- good air movement GI: soft, NT, ND, (+)BS Psychiatric: appropriate, cooperative Neurological: Ox3 Ext: no clubbing, cyanosis, or edema although RLE ACE wrapped so hard to visualize any edema    PRIOR EXAMS: General: Skin is warm. Wounds presenting still similar to below -vac removed from right lower leg -assessed wounds /incisions via pics- sutures stretches across- but great granulation tissue- very superficial, healing well.  Assessed pics of R  heel and leg- as below         Neurological:     Comments: Patient is alert.     Oriented x 3 and follows commands. CN exam non-focal. Fair insight and awareness. MMT: BUE 5/5. LLE limited by  pain but appears to be moving all muscle groups. RLE limited by wounds/dressings, etc. Sensory exam normal for light touch and pain except for right foot where there is diminished LT along dorsum and to a lesser extent plantar aspect of foot.  No limb ataxia or cerebellar signs. No abnormal tone appreciated.     Musc: right heel cord tight-  still about  minus 10-deg      Assessment/Plan: 1. Functional deficits which require 3+ hours per day of interdisciplinary therapy in a comprehensive inpatient rehab setting. Physiatrist is providing close team supervision and 24 hour management of active medical problems listed below. Physiatrist and rehab team continue to assess barriers to discharge/monitor patient progress toward functional and medical goals  Care Tool:  Bathing    Body parts bathed by patient: Right arm, Left arm, Chest, Abdomen, Face   Body parts bathed by helper: Front perineal area, Buttocks, Right upper leg, Left lower leg, Right lower leg, Left upper leg     Bathing assist Assist Level: Total Assistance - Patient < 25%     Upper Body Dressing/Undressing Upper body dressing   What is the patient wearing?: Pull over shirt, Orthosis    Upper body assist Assist Level: Supervision/Verbal cueing    Lower Body Dressing/Undressing Lower body dressing      What is the patient wearing?: Pants     Lower body assist Assist for lower body dressing: Minimal Assistance - Patient > 75%     Toileting Toileting    Toileting assist Assist for toileting: Moderate Assistance - Patient 50 - 74%     Transfers Chair/bed transfer  Transfers assist  Chair/bed transfer activity did not occur: Safety/medical concerns  Chair/bed transfer assist level: Supervision/Verbal cueing     Locomotion Ambulation   Ambulation assist   Ambulation activity did not occur: Safety/medical concerns  Assist level: 2 helpers Assistive device: Walker-rolling Max distance: 145'   Walk 10 feet activity   Assist  Walk 10 feet activity did not occur: Safety/medical concerns  Assist level: 2 helpers Assistive device: Walker-rolling   Walk 50 feet activity   Assist Walk 50 feet with 2 turns activity did not occur: Safety/medical concerns  Assist level: 2 helpers Assistive device: Walker-rolling    Walk 150 feet  activity   Assist Walk 150 feet activity did not occur: Safety/medical concerns  Assist level: 2 helpers Assistive device: Walker-rolling    Walk 10 feet on uneven surface  activity   Assist Walk 10 feet on uneven surfaces activity did not occur: Safety/medical concerns         Wheelchair     Assist Is the patient using a wheelchair?: Yes Type of Wheelchair: Manual Wheelchair activity did not occur: Safety/medical concerns  Wheelchair assist level: Supervision/Verbal cueing Max wheelchair distance: >200'    Wheelchair 50 feet with 2 turns activity    Assist    Wheelchair 50 feet with 2 turns activity did not occur: Safety/medical concerns   Assist Level: Supervision/Verbal cueing   Wheelchair 150 feet activity     Assist  Wheelchair 150 feet activity did not occur: Safety/medical concerns   Assist Level: Supervision/Verbal cueing   Blood pressure 104/75, pulse 91, temperature 98.2 F (36.8 C),  temperature source Oral, resp. rate 16, height 5' 9 (1.753 m), weight 86.2 kg, SpO2 97%.  Medical Problem List and Plan: 1. Functional deficits secondary to PAD/compartment syndrome/right leg ischemia/status post thrombectomy.  Status post right leg anterior compartment fasciotomy 9/26 with multiple washouts as well as excisional debridement/wound VAC.  Patient is completing a course of doxycycline             -patient may not yet shower             -ELOS/Goals: 11/11,  supervision goals  -ordered PRAFO to help stretch right heel cord  D/c 11/11 Con't CIR PT and OT AFO consult to be scheduled this week D/w nursing about vinegar soaks- also d/w nursing care coordinator 2.  Antithrombotics: -DVT/anticoagulation:  Pharmaceutical: now on eliquis 5mg  BID             -antiplatelet therapy: Aspirin  81 mg daily 3. Pain Management: Lyrica  150 mg 3 times daily,  tylenol  prn.    10/22 doing well with tylenol  alone. Off all narcotics (prefers this) 10/24 pt doesn't  want to increase lyrica  any further at this point (not too much higher it can go) 10/27- pain tolerable- spikes ~ 12x/day but going well otherwise with tylenol  alone 10/28-10/31  pain mainly tolerable- doesn't want other meds 4. Mood/Behavior/Sleep: Cymbalta  30 mg nightly, Celexa  20 mg daily             -antipsychotic agents: N/A  -ambien  PRN 5. Neuropsych/cognition: This patient is capable of making decisions on his own behalf. 6. Skin/Wound Care: Routine skin checks -vac removed-->Vashe soaked gauze over wound,dry gauze, kerlix, ACE --daily change -local care and pressure relief as needed to multiple other wounds. On doxycycline             -prevalon boot/pressure relief, right heel wound  10/28- Said needed vinegar for a wound on R heel- will d/w nursing  10/30- assessed RLE wounds- yesterday-  looking better and heel stable 10/31- went over pics- heel about the same- asked nursing to get vinegar soaks 7. Fluids/Electrolytes/Nutrition: Routine and analysis with follow-up chemistries, continue vitamins/supplements.   8. Lumbar radiculopathy/large disc herniation spinal stenosis.  Status post L4-5 transforaminal interbody fusion/posterior lateral fusion insertion interbody device L4-5 decompression 09/08/2024 per Dr. Beuford.  Back brace when out of bed  -10/24--Dr. Letitia revised TLSO to LSO which he needs to wear when standing or ambulating   -back precautions ongoing 9.  Acute blood loss anemia.  Follow-up CBC 10/14- Pt has ~ 150-200cc out of VAC- not sure since when? Hb down 1/2 unit from 8.9 to 8.4- will recheck Thursday  10/15- Hb will check in AM  10/17- Hb 9.0  10/27- Hb 9.9- VAC off--> up to 10.5 10/31 10.  AKI.  Resolved after IV hydration. 11.  Hyperlipidemia.  Crestor  20mg  daily 12.  COPD/remote tobacco use.  Monitor oxygen  saturations every shift 10/15- was on O2 this AM- per nursing from overnight only- but Sats 89-91%- getting CXR and ABG to see where he is- and getting  sepsis work up.   10/16- started on Unasyn and will start Mucinex for coarse cough-   13.  History of prostate cancer/urinary retention.  History of status post prostatectomy.  External catheter currently in place.  Continue Flomax  0.4 mg daily 10/14- is voiding- but had urinary retention last time- will check a few bladder scans.  10/15 - pt needing in/out caths- had >1400cc in bladder- has cathing protocol now 10/16-10/17 has orders to  bladder scan and in/out if volumes >350cc- requiring caths sometimes  10/18 may need to cath more frequently if he's having volumes >1000cc  10/19 cath volumes a little less yesterday--continue with current mgt  10/24 still having intermittent retention   -continue low dose urecholine at high volumes   -increase urecholine to 25mg  tid, continue flomax    -up to EOB/toilet to empty 10/29-11/2 no caths required- voiding well now 14.  History of detached retina left eye.  Follow-up outpatient ophthalmology. 15. Severe constipation 10/14- LBM at least 4-5 days ago per documentation- pt doesn't remember when went last- will get KUB- and order Sorbitol  30cc- and SSE if no results- having abd pain.  10/16- Pt had 1 liquid stool- but otherwise, no other stools in 6-7 days- last KUB shows on 10/14 was full of stool- will give Sorbitol  since likely has hard stool that liquid getting by- and SSE- if that doesn't work, will ask nursing to do Mg citrate.   10/17- had massive BM  per pt- per chart 1 large, 1 medium-   10/18 smear yesterday. Continue senna-s and increase miralax  to bid. 10/29-10/31- Having Bms pretty much daily now, that off tylenol   -10/31/24 LBM yesterday 16.  Hypokalemia  10/14- K+ 3.2- will replete KCL 40 Meq x1. -- fine on 10/23 10/27- K+ 3.8 17. Low grade temp in setting of delirium  10/15- will get sepsis work up, ammonia level and ABG  10/17- has HCAP- Per #21 18. Cirrhosis on last Abd imaging  10/15- will get Ammonia as well esp with new  jerking/tremors.  10/16- is actually shivering this AM, but isn't cold- Ammonia 16- not the cause of Sx's.   10/20- ALT is 47- slightly up but AST down to 37- can con't tylenol  as ordered  10/28- will check CMP next time 10/30- changed to tomorrow- hasn't been drawn yet this AM at of 11am  10/31- Labs not done yet from yesterday or today- checking with nursing as to why-- LFTs normalized 19. Infrarenal aortic aneurysm with mural thrombus- 6.8 cm on last imaging   20. Orthostatic hypotension 10/16- On Flomax , however No BP meds that I can reduce- will check with therapy- if dropping more, will add Midodrine- ordering ACE wrap on LLE  -addendum- adding Midodrine 5 mg TID starting at 630 am  10/19 bp's a little more robust over weekend thus far. No changes today  10/22-24 improved on midodrine 10/28- 10/30- BP doing better on Midodrine- still a little soft, but better. Con't to monitor 10/31- will increase midodrine to 10 mg TID_ and changed times to 630, 1030 and 230-  -11/1-2/25 BPs stable, occasionally soft, has fluids running @100ml /hr Vitals:   10/28/24 1315 10/28/24 1540 10/28/24 1958 10/28/24 2359  BP: 101/73 97/69 (!) 109/56 99/74   10/29/24 0452 10/29/24 1222 10/29/24 1554 10/29/24 2151  BP: 107/74 107/82 91/68 120/67   10/30/24 0534 10/30/24 1327 10/30/24 1943 10/31/24 0433  BP: 111/67 108/68 (!) 100/54 104/75    21. HCAP 10/16- on Unasyn-  will con't for 5-6 days- monitor WBC- down to 12.7 from 13.2 last evening. Shivering could be due to breaking a temp>>?? 10/17- no tremors/shivering- will recheck CBC in AM- and make sure on right ABX-  and CMP due to LFTS/renal check on IV ABX- Added Albuterol  scheduled QID due to wheezing and bad COPD   10/20- WBC down to 8.2-   Unasyn completed      LOS: 20 days A FACE TO FACE EVALUATION WAS PERFORMED  Elma Shands 10/31/2024, 11:04 AM

## 2024-10-31 NOTE — Plan of Care (Signed)
  Problem: SCI BOWEL ELIMINATION Goal: RH STG MANAGE BOWEL WITH ASSISTANCE Description: STG Manage Bowel with supervision Assistance. Outcome: Progressing   Problem: SCI BLADDER ELIMINATION Goal: RH STG MANAGE BLADDER WITH ASSISTANCE Description: STG Manage Bladder With supervision Assistance Outcome: Progressing   Problem: RH SKIN INTEGRITY Goal: RH STG SKIN FREE OF INFECTION/BREAKDOWN Description: Manage skin free of infection with supervision assistance Outcome: Progressing   Problem: RH SAFETY Goal: RH STG ADHERE TO SAFETY PRECAUTIONS W/ASSISTANCE/DEVICE Description: STG Adhere to Safety Precautions With Assistance/Device. Outcome: Progressing   Problem: RH PAIN MANAGEMENT Goal: RH STG PAIN MANAGED AT OR BELOW PT'S PAIN GOAL Description: < 4 w/ prns Outcome: Progressing   Problem: RH KNOWLEDGE DEFICIT SCI Goal: RH STG INCREASE KNOWLEDGE OF SELF CARE AFTER SCI Description: Manage increase knowledge of self care after SCI with supervision assistance from wife using educational materials provided Outcome: Progressing   Problem: Education: Goal: Knowledge of General Education information will improve Description: Including pain rating scale, medication(s)/side effects and non-pharmacologic comfort measures Outcome: Progressing   Problem: Health Behavior/Discharge Planning: Goal: Ability to manage health-related needs will improve Outcome: Progressing   Problem: Clinical Measurements: Goal: Ability to maintain clinical measurements within normal limits will improve Outcome: Progressing Goal: Will remain free from infection Outcome: Progressing Goal: Diagnostic test results will improve Outcome: Progressing Goal: Respiratory complications will improve Outcome: Progressing Goal: Cardiovascular complication will be avoided Outcome: Progressing   Problem: Activity: Goal: Risk for activity intolerance will decrease Outcome: Progressing   Problem: Nutrition: Goal:  Adequate nutrition will be maintained Outcome: Progressing   Problem: Coping: Goal: Level of anxiety will decrease Outcome: Progressing   Problem: Elimination: Goal: Will not experience complications related to bowel motility Outcome: Progressing Goal: Will not experience complications related to urinary retention Outcome: Progressing   Problem: Pain Managment: Goal: General experience of comfort will improve and/or be controlled Outcome: Progressing   Problem: Safety: Goal: Ability to remain free from injury will improve Outcome: Progressing   Problem: Skin Integrity: Goal: Risk for impaired skin integrity will decrease Outcome: Progressing

## 2024-10-31 NOTE — Progress Notes (Signed)
 Occupational Therapy Session Note  Patient Details  Name: David King MRN: [id number] Date of Birth: 1956/12/24  Today's Date: 10/31/2024 OT Individual Time: 0700-0800 OT Individual Time Calculation (min): 60 min    Short Term Goals: Week 2:  OT Short Term Goal 1 (Week 2): Patient to complete LB dressing with Mod A OT Short Term Goal 2 (Week 2): Patient to perform toileting with Mod A OT Short Term Goal 2 - Progress (Week 2): Progressing toward goal OT Short Term Goal 3 (Week 2): Patient to perform toilet transfer with Min assist OT Short Term Goal 4 (Week 2): Pt will don/doff TLSO brace with Mod A  Skilled Therapeutic Interventions/Progress Updates:    Skilled OT intervention with focus on sit<>stand, standing balance, functional transfers, dressing with sit<>stand from EOB, and toileting to increase independence with bADLS. BSC transfers with CGA using RW. All standing balance tasks with close supervision. Pt uses reacher to assist with threading BLE into pants with supervision. Pt requires assistance with toileting hygiene. Donning socks/shoes/AFO with mod A. Pt engaged in standing task playing cornhole. Standing balance with supervison. Pt amb with RW to board and used reacher to retrieve bean bags from floor. Pt stood at table and cleaned all bean bags using BUE. No LOB noted. Pt reaturned to room and reamined in w/c with all needs within reach.   Therapy Documentation Precautions:  Precautions Precautions: Fall, Back Precaution Booklet Issued: Yes (comment) Recall of Precautions/Restrictions: Impaired Precaution/Restrictions Comments: watch BP (orthostatic); RLE foot drop, wound VAC Required Braces or Orthoses: Spinal Brace Spinal Brace: Thoracolumbosacral orthotic, Applied in sitting position Restrictions Weight Bearing Restrictions Per Provider Order: No RLE Weight Bearing Per Provider Order: Weight bearing as tolerated Other Position/Activity Restrictions: transfers only on  RLE   Pain: Pt c/o intermittent RLE (foot) pain; typically resolved within 60-90 secs   Therapy/Group: Individual Therapy  [name] 10/31/2024, 11:53 AM

## 2024-11-01 DIAGNOSIS — T79A21A Traumatic compartment syndrome of right lower extremity, initial encounter: Secondary | ICD-10-CM | POA: Diagnosis not present

## 2024-11-01 LAB — CBC WITH DIFFERENTIAL/PLATELET
Abs Immature Granulocytes: 0.04 K/uL (ref 0.00–0.07)
Basophils Absolute: 0 K/uL (ref 0.0–0.1)
Basophils Relative: 1 %
Eosinophils Absolute: 0.4 K/uL (ref 0.0–0.5)
Eosinophils Relative: 6 %
HCT: 33.7 % — ABNORMAL LOW (ref 39.0–52.0)
Hemoglobin: 10.3 g/dL — ABNORMAL LOW (ref 13.0–17.0)
Immature Granulocytes: 1 %
Lymphocytes Relative: 24 %
Lymphs Abs: 1.4 K/uL (ref 0.7–4.0)
MCH: 29.6 pg (ref 26.0–34.0)
MCHC: 30.6 g/dL (ref 30.0–36.0)
MCV: 96.8 fL (ref 80.0–100.0)
Monocytes Absolute: 0.5 K/uL (ref 0.1–1.0)
Monocytes Relative: 9 %
Neutro Abs: 3.5 K/uL (ref 1.7–7.7)
Neutrophils Relative %: 59 %
Platelets: 165 K/uL (ref 150–400)
RBC: 3.48 MIL/uL — ABNORMAL LOW (ref 4.22–5.81)
RDW: 18.3 % — ABNORMAL HIGH (ref 11.5–15.5)
WBC: 5.8 K/uL (ref 4.0–10.5)
nRBC: 0 % (ref 0.0–0.2)

## 2024-11-01 LAB — COMPREHENSIVE METABOLIC PANEL WITH GFR
ALT: 28 U/L (ref 0–44)
AST: 22 U/L (ref 15–41)
Albumin: 2.9 g/dL — ABNORMAL LOW (ref 3.5–5.0)
Alkaline Phosphatase: 62 U/L (ref 38–126)
Anion gap: 10 (ref 5–15)
BUN: 19 mg/dL (ref 8–23)
CO2: 24 mmol/L (ref 22–32)
Calcium: 8.9 mg/dL (ref 8.9–10.3)
Chloride: 106 mmol/L (ref 98–111)
Creatinine, Ser: 1.1 mg/dL (ref 0.61–1.24)
GFR, Estimated: >60 mL/min (ref >60)
Glucose, Bld: 99 mg/dL (ref 70–99)
Potassium: 4.1 mmol/L (ref 3.5–5.1)
Sodium: 140 mmol/L (ref 135–145)
Total Bilirubin: 0.7 mg/dL (ref 0.0–1.2)
Total Protein: 6.5 g/dL (ref 6.5–8.1)

## 2024-11-01 NOTE — Plan of Care (Signed)
  Problem: SCI BOWEL ELIMINATION Goal: RH STG MANAGE BOWEL WITH ASSISTANCE Description: STG Manage Bowel with supervision Assistance. Outcome: Progressing   Problem: SCI BLADDER ELIMINATION Goal: RH STG MANAGE BLADDER WITH ASSISTANCE Description: STG Manage Bladder With supervision Assistance Outcome: Progressing   Problem: RH SKIN INTEGRITY Goal: RH STG SKIN FREE OF INFECTION/BREAKDOWN Description: Manage skin free of infection with supervision assistance Outcome: Progressing   Problem: RH SAFETY Goal: RH STG ADHERE TO SAFETY PRECAUTIONS W/ASSISTANCE/DEVICE Description: STG Adhere to Safety Precautions With Assistance/Device. Outcome: Progressing   Problem: RH PAIN MANAGEMENT Goal: RH STG PAIN MANAGED AT OR BELOW PT'S PAIN GOAL Description: < 4 w/ prns Outcome: Progressing   Problem: RH KNOWLEDGE DEFICIT SCI Goal: RH STG INCREASE KNOWLEDGE OF SELF CARE AFTER SCI Description: Manage increase knowledge of self care after SCI with supervision assistance from wife using educational materials provided Outcome: Progressing   Problem: Education: Goal: Knowledge of General Education information will improve Description: Including pain rating scale, medication(s)/side effects and non-pharmacologic comfort measures Outcome: Progressing   Problem: Health Behavior/Discharge Planning: Goal: Ability to manage health-related needs will improve Outcome: Progressing   Problem: Clinical Measurements: Goal: Ability to maintain clinical measurements within normal limits will improve Outcome: Progressing Goal: Will remain free from infection Outcome: Progressing Goal: Diagnostic test results will improve Outcome: Progressing Goal: Respiratory complications will improve Outcome: Progressing Goal: Cardiovascular complication will be avoided Outcome: Progressing   Problem: Activity: Goal: Risk for activity intolerance will decrease Outcome: Progressing   Problem: Nutrition: Goal:  Adequate nutrition will be maintained Outcome: Progressing   Problem: Coping: Goal: Level of anxiety will decrease Outcome: Progressing   Problem: Elimination: Goal: Will not experience complications related to bowel motility Outcome: Progressing Goal: Will not experience complications related to urinary retention Outcome: Progressing   Problem: Pain Managment: Goal: General experience of comfort will improve and/or be controlled Outcome: Progressing   Problem: Safety: Goal: Ability to remain free from injury will improve Outcome: Progressing   Problem: Skin Integrity: Goal: Risk for impaired skin integrity will decrease Outcome: Progressing

## 2024-11-01 NOTE — Progress Notes (Signed)
 PROGRESS NOTE   Subjective/Complaints:  Pt reports  no significant PT over weekend only 1 session Sunday- but did his HEP all weekend in the chair-   Impatient to get home- walking ?100 ft and walking up/down stairs he needs to do to get home.   Low BP better- but still on low- side- Sx's somewhat better since increase Midodrine.  Reports that arms the same temp- no Sx's of problems but thinks the BP in L forearm is more normal than BP in RYE which is always low.    ROS: as per HPI   Pt denies SOB, abd pain, CP, N/V/C/D, and vision changes   Low BP (+)   Objective:   No results found.   Recent Labs    11/01/24 0302  WBC 5.8  HGB 10.3*  HCT 33.7*  PLT 165     Recent Labs    11/01/24 0302  NA 140  K 4.1  CL 106  CO2 24  GLUCOSE 99  BUN 19  CREATININE 1.10  CALCIUM  8.9      Intake/Output Summary (Last 24 hours) at 11/01/2024 1347 Last data filed at 11/01/2024 1239 Gross per 24 hour  Intake 590 ml  Output 1100 ml  Net -510 ml     Wound 10/11/24 1630 Pressure Injury Heel Right Unstageable - Full thickness tissue loss in which the base of the injury is covered by slough (yellow, tan, gray, green or brown) and/or eschar (tan, brown or black) in the wound bed. (Active)     Wound 10/22/24 0945 Pressure Injury Foot Right Stage 1 -  Intact skin with non-blanchable redness of a localized area usually over a bony prominence. (Active)    Physical Exam: Vital Signs Blood pressure 98/67, pulse 100, temperature 98 F (36.7 C), temperature source Oral, resp. rate 19, height 5' 9 (1.753 m), weight 86.2 kg, SpO2 98%.     General: awake, alert, appropriate,  ate 100% tray;  NAD HENT: conjugate gaze; oropharynx moist CV: regular rate and rhythm; no JVD Pulmonary: CTA B/L; no W/R/R- good air movement but 1x COPD sounding cough (has COPD) GI: soft, NT, ND, (+)BS- normoactive Psychiatric:  appropriate Neurological: Ox3  Ext: no clubbing, cyanosis, or edema although RLE ACE wrapped this AM- cannot assess on direct visual exam   PRIOR EXAMS: General: Skin is warm. Wounds presenting still similar to below -vac removed from right lower leg -assessed wounds /incisions via pics- sutures stretches across- but great granulation tissue- very superficial, healing well.  Assessed pics of R heel and leg- as below         Neurological:     Comments: Patient is alert.     Oriented x 3 and follows commands. CN exam non-focal. Fair insight and awareness. MMT: BUE 5/5. LLE limited by  pain but appears to be moving all muscle groups. RLE limited by wounds/dressings, etc. Sensory exam normal for light touch and pain except for right foot where there is diminished LT along dorsum and to a lesser extent plantar aspect of foot.  No limb ataxia or cerebellar signs. No abnormal tone appreciated.     Musc: right heel cord tight- still about  minus 10-deg      Assessment/Plan: 1. Functional deficits which require 3+ hours per day of interdisciplinary therapy in a comprehensive inpatient rehab setting. Physiatrist is providing close team supervision and 24 hour management of active medical problems listed below. Physiatrist and rehab team continue to assess barriers to discharge/monitor patient progress toward functional and medical goals  Care Tool:  Bathing    Body parts bathed by patient: Right arm, Left arm, Chest, Abdomen, Face   Body parts bathed by helper: Front perineal area, Buttocks, Right upper leg, Left lower leg, Right lower leg, Left upper leg     Bathing assist Assist Level: Total Assistance - Patient < 25%     Upper Body Dressing/Undressing Upper body dressing   What is the patient wearing?: Pull over shirt, Orthosis    Upper body assist Assist Level: Supervision/Verbal cueing    Lower Body Dressing/Undressing Lower body dressing      What is the patient wearing?:  Pants     Lower body assist Assist for lower body dressing: Supervision/Verbal cueing     Toileting Toileting    Toileting assist Assist for toileting: Minimal Assistance - Patient > 75%     Transfers Chair/bed transfer  Transfers assist  Chair/bed transfer activity did not occur: Safety/medical concerns  Chair/bed transfer assist level: Supervision/Verbal cueing     Locomotion Ambulation   Ambulation assist   Ambulation activity did not occur: Safety/medical concerns  Assist level: 2 helpers Assistive device: Walker-rolling Max distance: 145'   Walk 10 feet activity   Assist  Walk 10 feet activity did not occur: Safety/medical concerns  Assist level: 2 helpers Assistive device: Walker-rolling   Walk 50 feet activity   Assist Walk 50 feet with 2 turns activity did not occur: Safety/medical concerns  Assist level: 2 helpers Assistive device: Walker-rolling    Walk 150 feet activity   Assist Walk 150 feet activity did not occur: Safety/medical concerns  Assist level: 2 helpers Assistive device: Walker-rolling    Walk 10 feet on uneven surface  activity   Assist Walk 10 feet on uneven surfaces activity did not occur: Safety/medical concerns         Wheelchair     Assist Is the patient using a wheelchair?: Yes Type of Wheelchair: Manual Wheelchair activity did not occur: Safety/medical concerns  Wheelchair assist level: Supervision/Verbal cueing Max wheelchair distance: >200'    Wheelchair 50 feet with 2 turns activity    Assist    Wheelchair 50 feet with 2 turns activity did not occur: Safety/medical concerns   Assist Level: Supervision/Verbal cueing   Wheelchair 150 feet activity     Assist  Wheelchair 150 feet activity did not occur: Safety/medical concerns   Assist Level: Supervision/Verbal cueing   Blood pressure 98/67, pulse 100, temperature 98 F (36.7 C), temperature source Oral, resp. rate 19, height 5' 9  (1.753 m), weight 86.2 kg, SpO2 98%.  Medical Problem List and Plan: 1. Functional deficits secondary to PAD/compartment syndrome/right leg ischemia/status post thrombectomy.  Status post right leg anterior compartment fasciotomy 9/26 with multiple washouts as well as excisional debridement/wound VAC.  Patient is completing a course of doxycycline             -patient may not yet shower             -ELOS/Goals: 11/11,  supervision goals  -ordered PRAFO to help stretch right heel cord  D/c 11/11 Con't CIR PT and OT Pt wants to leave this week  if possible. Will d/w team 2.  Antithrombotics: -DVT/anticoagulation:  Pharmaceutical: now on eliquis 5mg  BID             -antiplatelet therapy: Aspirin  81 mg daily 3. Pain Management: Lyrica  150 mg 3 times daily,  tylenol  prn.    10/22 doing well with tylenol  alone. Off all narcotics (prefers this) 10/24 pt doesn't want to increase lyrica  any further at this point (not too much higher it can go) 10/27- pain tolerable- spikes ~ 12x/day but going well otherwise with tylenol  alone 11/3  pain mainly tolerable- doesn't want other meds 4. Mood/Behavior/Sleep: Cymbalta  30 mg nightly, Celexa  20 mg daily             -antipsychotic agents: N/A  -ambien  PRN 5. Neuropsych/cognition: This patient is capable of making decisions on his own behalf. 6. Skin/Wound Care: Routine skin checks -vac removed-->Vashe soaked gauze over wound,dry gauze, kerlix, ACE --daily change -local care and pressure relief as needed to multiple other wounds. On doxycycline             -prevalon boot/pressure relief, right heel wound  10/28- Said needed vinegar for a wound on R heel- will d/w nursing  10/30- assessed RLE wounds- yesterday-  looking better and heel stable 10/31- went over pics- heel about the same- asked nursing to get vinegar soaks 11/3- changed the wound care per nursing coordinator 7. Fluids/Electrolytes/Nutrition: Routine and analysis with follow-up chemistries,  continue vitamins/supplements.   8. Lumbar radiculopathy/large disc herniation spinal stenosis.  Status post L4-5 transforaminal interbody fusion/posterior lateral fusion insertion interbody device L4-5 decompression 09/08/2024 per Dr. Beuford.  Back brace when out of bed  -10/24--Dr. Letitia revised TLSO to LSO which he needs to wear when standing or ambulating   -back precautions ongoing 9.  Acute blood loss anemia.  Follow-up CBC 10/14- Pt has ~ 150-200cc out of VAC- not sure since when? Hb down 1/2 unit from 8.9 to 8.4- will recheck Thursday  10/15- Hb will check in AM  10/17- Hb 9.0  10/27- Hb 9.9- VAC off--> up to 10.5 10/31  11/3- Hb 10.3- plts down somewhat to 165k- from 243k- will recheck before d/c  10.  AKI.  Resolved after IV hydration. 11.  Hyperlipidemia.  Crestor  20mg  daily 12.  COPD/remote tobacco use.  Monitor oxygen  saturations every shift 10/15- was on O2 this AM- per nursing from overnight only- but Sats 89-91%- getting CXR and ABG to see where he is- and getting sepsis work up.   10/16- started on Unasyn and will start Mucinex for coarse cough-   11/3- occ COPD cough, but sounds clear 13.  History of prostate cancer/urinary retention.  History of status post prostatectomy.  External catheter currently in place.  Continue Flomax  0.4 mg daily 10/14- is voiding- but had urinary retention last time- will check a few bladder scans.  10/15 - pt needing in/out caths- had >1400cc in bladder- has cathing protocol now 10/16-10/17 has orders to bladder scan and in/out if volumes >350cc- requiring caths sometimes  10/18 may need to cath more frequently if he's having volumes >1000cc  10/19 cath volumes a little less yesterday--continue with current mgt  10/24 still having intermittent retention   -continue low dose urecholine at high volumes   -increase urecholine to 25mg  tid, continue flomax    -up to EOB/toilet to empty 10/29-11/2 no caths required- voiding well now 14.  History  of detached retina left eye.  Follow-up outpatient ophthalmology. 15. Severe constipation 10/14- LBM at least  4-5 days ago per documentation- pt doesn't remember when went last- will get KUB- and order Sorbitol  30cc- and SSE if no results- having abd pain.  10/16- Pt had 1 liquid stool- but otherwise, no other stools in 6-7 days- last KUB shows on 10/14 was full of stool- will give Sorbitol  since likely has hard stool that liquid getting by- and SSE- if that doesn't work, will ask nursing to do Mg citrate.   10/17- had massive BM  per pt- per chart 1 large, 1 medium-   10/18 smear yesterday. Continue senna-s and increase miralax  to bid. 10/29-10/31- Having Bms pretty much daily now, that off tylenol   -10/31/24 LBM yesterday 16.  Hypokalemia  10/14- K+ 3.2- will replete KCL 40 Meq x1. -- fine on 10/23 10/27- K+ 3.8 17. Low grade temp in setting of delirium  10/15- will get sepsis work up, ammonia level and ABG  10/17- has HCAP- Per #21 18. Cirrhosis on last Abd imaging  10/15- will get Ammonia as well esp with new jerking/tremors.  10/16- is actually shivering this AM, but isn't cold- Ammonia 16- not the cause of Sx's.   10/20- ALT is 47- slightly up but AST down to 37- can con't tylenol  as ordered  10/28- will check CMP next time 10/30- changed to tomorrow- hasn't been drawn yet this AM at of 11am  10/31- Labs not done yet from yesterday or today- checking with nursing as to why-- LFTs normalized 19. Infrarenal aortic aneurysm with mural thrombus- 6.8 cm on last imaging   20. Orthostatic hypotension 10/16- On Flomax , however No BP meds that I can reduce- will check with therapy- if dropping more, will add Midodrine- ordering ACE wrap on LLE  -addendum- adding Midodrine 5 mg TID starting at 630 am  10/19 bp's a little more robust over weekend thus far. No changes today  10/22-24 improved on midodrine 10/28- 10/30- BP doing better on Midodrine- still a little soft, but better. Con't to  monitor 10/31- will increase midodrine to 10 mg TID_ and changed times to 630, 1030 and 230-  -11/1-2/25 BPs stable, occasionally soft, has fluids running @100ml /hr 11/3- OFF IVFs- will have nursing check BP in B/L UEs and see if different- if so , will call Vascular Vitals:   10/29/24 0452 10/29/24 1222 10/29/24 1554 10/29/24 2151  BP: 107/74 107/82 91/68 120/67   10/30/24 0534 10/30/24 1327 10/30/24 1943 10/31/24 0433  BP: 111/67 108/68 (!) 100/54 104/75   10/31/24 1328 10/31/24 2004 11/01/24 0335 11/01/24 1300  BP: 134/81 120/67 (!) 134/94 98/67    21. HCAP 10/16- on Unasyn-  will con't for 5-6 days- monitor WBC- down to 12.7 from 13.2 last evening. Shivering could be due to breaking a temp>>?? 10/17- no tremors/shivering- will recheck CBC in AM- and make sure on right ABX-  and CMP due to LFTS/renal check on IV ABX- Added Albuterol  scheduled QID due to wheezing and bad COPD   10/20- WBC down to 8.2-   Unasyn completed  22. Urinary dropping urinal  11/3- Asked nursing to get male urinal to help   I spent a total of  43  minutes on total care today- >50% coordination of care- due to  D/w pt about blood pressure;  as well as urinary incontinence- and went over HEP- and d/w nursing about Femal eurinal- also went over labs vitals and B/B    LOS: 21 days A FACE TO FACE EVALUATION WAS PERFORMED  Sharlene Mccluskey 11/01/2024, 1:47 PM

## 2024-11-01 NOTE — Progress Notes (Signed)
 Physical Therapy Session Note  Patient Details  Name: David King MRN: [id number] Date of Birth: Jan 17, 1956  Today's Date: 11/01/2024 PT Individual Time: 1500-1530 PT Individual Time Calculation (min): 30 min   Short Term Goals: Week 3:  PT Short Term Goal 1 (Week 3): pt will ambulate >100 ft with CGA PT Short Term Goal 2 (Week 3): pt will navigate x 8 steps PT Short Term Goal 3 (Week 3): pt will perform STS with supervision consistently  Skilled Therapeutic Interventions/Progress Updates:    Pt seated in w/c on arrival and agreeable to therapy. Pt reports no change in pain levels, no intervention required.   Pt propelled w/c with BUE to/from gym for endurance and functional mobility. Discussed switching to standard [facility]  now that BP is more stable on higher dose of midodrine. Pt agreeable.   Pt performed 2 x 2 laps of ~30 ft gait and 6 stairs x 4 with Bil hand rails. CGA overall, with pt showing signs of fatigue after second lap both trials. Cues for maintaining upright posture and step length when fatigued. Pt returned to room and performed ambulatory transfer to bed with CGA, bed mobility with supervision. Pt was left with all needs in reach and alarm active.   Therapy Documentation Precautions:  Precautions Precautions: Fall, Back Precaution Booklet Issued: Yes (comment) Recall of Precautions/Restrictions: Impaired Precaution/Restrictions Comments: watch BP (orthostatic); RLE foot drop, wound VAC Required Braces or Orthoses: Spinal Brace Spinal Brace: Thoracolumbosacral orthotic, Applied in sitting position Restrictions Weight Bearing Restrictions Per Provider Order: No RLE Weight Bearing Per Provider Order: Weight bearing as tolerated Other Position/Activity Restrictions: transfers only on RLE General:       Therapy/Group: Individual Therapy  David King 11/01/2024, 3:40 PM

## 2024-11-01 NOTE — Progress Notes (Signed)
 Occupational Therapy Session Note  Patient Details  Name: David King MRN: [id number] Date of Birth: Feb 23, 1956  Today's Date: 11/01/2024 OT Individual Time: 9184-9069 OT Individual Time Calculation (min): 75 min    Short Term Goals: Week 4:  OT Short Term Goal 1 (Week 4): STG=LTG 2/2 ELOS (LTG upgraded)  Skilled Therapeutic Interventions/Progress Updates:   Pt seated EOB upon arrival. Pt had already completed dressing tasks. Amb with RW to w/c. NuStep 10 mins rolling hills levels 4-9 for BLE strengthening and general conditioning. Standing activity at window-placing and removing Squigz with rest break. Pt stood at table to clean all items using BUE. Pt noted increased discomfort with extended standing. Transition to ADL apartment for home safety educaiton and practice removing items from cabinets. Discussed upcoming family education with wife. Pt returned to room and amb with RW approx 25'. Pt returned to w/c. All needs wihtin reach.   Therapy Documentation Precautions:  Precautions Precautions: Fall, Back Precaution Booklet Issued: Yes (comment) Recall of Precautions/Restrictions: Impaired Precaution/Restrictions Comments: watch BP (orthostatic); RLE foot drop, wound VAC Required Braces or Orthoses: Spinal Brace Spinal Brace: Thoracolumbosacral orthotic, Applied in sitting position Restrictions Weight Bearing Restrictions Per Provider Order: No RLE Weight Bearing Per Provider Order: Weight bearing as tolerated Other Position/Activity Restrictions: transfers only on RLE   Pain:  Pt reports ongoing RLE (foot) pain (8/10); rest as appropriate and meds admin prior to therapy   Therapy/Group: Individual Therapy  [name] 11/01/2024, 11:58 AM

## 2024-11-01 NOTE — Progress Notes (Signed)
 Occupational Therapy Weekly Progress Note  Patient Details  Name: David King MRN: [id number] Date of Birth: 19-Feb-1956  Beginning of progress report period: October 25, 2024 End of progress report period: November 01, 2024   Patient has met 3 of 3 short term goals.  Pt made steady progress with BADLs and functional transfers over the past week. All functional transfers using RW with CGA. LB dressing using AE with min A. Toileting with mod A. Pt's overall endurance has improved over the past week. Pt's wife has been present for some therapy sessions.  Patient continues to demonstrate the following deficits: {impairments:3041632} and therefore will continue to benefit from skilled OT intervention to enhance overall performance with {ADL/iADL:3041649}.  Patient {LTG progression:3041653}.  {plan of rjmz:6958345}  OT Short Term Goals Week 3:  OT Short Term Goal 1 (Week 3): Patient to perform toileting with Mod A OT Short Term Goal 1 - Progress (Week 3): Met OT Short Term Goal 2 (Week 3): Pt will complete LB dressing with min A OT Short Term Goal 2 - Progress (Week 3): Met OT Short Term Goal 3 (Week 3): Pt will complete bathing tasks with min A using AE PRN OT Short Term Goal 3 - Progress (Week 3): Met Week 4:  OT Short Term Goal 1 (Week 4): STG=LTG 2/2 ELOS (LTG upgraded)   [name] 11/01/2024, 6:46 AM

## 2024-11-01 NOTE — Progress Notes (Signed)
 Occupational Therapy Session Note  Patient Details  Name: David King MRN: [id number] Date of Birth: Nov 30, 1956  Today's Date: 11/01/2024 OT Individual Time: 1330-1400 OT Individual Time Calculation (min): 30 min    Short Term Goals: Week 2:  OT Short Term Goal 1 (Week 2): Patient to complete LB dressing with Mod A OT Short Term Goal 2 (Week 2): Patient to perform toileting with Mod A OT Short Term Goal 2 - Progress (Week 2): Progressing toward goal OT Short Term Goal 3 (Week 2): Patient to perform toilet transfer with Min assist OT Short Term Goal 4 (Week 2): Pt will don/doff TLSO brace with Mod A  Skilled Therapeutic Interventions/Progress Updates:      Therapy Documentation Precautions:  Precautions Precautions: Fall, Back Precaution Booklet Issued: Yes (comment) Recall of Precautions/Restrictions: Impaired Precaution/Restrictions Comments: watch BP (orthostatic); RLE foot drop, wound VAC Required Braces or Orthoses: Spinal Brace Spinal Brace: Thoracolumbosacral orthotic, Applied in sitting position Restrictions Weight Bearing Restrictions Per Provider Order: No RLE Weight Bearing Per Provider Order: Weight bearing as tolerated Other Position/Activity Restrictions: transfers only on RLE General:  Pt seated in W/C upon OT arrival, agreeable to OT.  Pain:  6/10 pain reported in RLE, activity, intermittent rest breaks, distractions provided for pain management, pt reports tolerable to proceed. Pt also reporting just received medication from nsg  Other Treatments: OT session focusing on skilled education in order to prepare for D/C. OT educating on LB AE in order to increase independence wit donning footwear as well as AFO. Pt attempting figure 4 stretch, pt reporting tightness in hips, increasing difficulty to perform movement. OT recommending pt complete hamstring and hip stretches within back precautions in order for decreased muscle tightness to increase independence. OT  also educating pt on HH vs OP therapies. OT also educating on DME (pt has all required DME) and wound care. Pt reporting wife will have to assist with wound care once D/C.    Pt seated in W/C at end of session with call light within reach and 4Ps assessed.    Therapy/Group: Individual Therapy  Camie Hoe, OTD, OTR/L 11/01/2024, 4:43 PM

## 2024-11-02 DIAGNOSIS — T79A21A Traumatic compartment syndrome of right lower extremity, initial encounter: Secondary | ICD-10-CM | POA: Diagnosis not present

## 2024-11-02 MED ORDER — BETHANECHOL CHLORIDE 10 MG PO TABS
10.0000 mg | ORAL_TABLET | Freq: Three times a day (TID) | ORAL | Status: DC
  Administered 2024-11-02 – 2024-11-04 (×6): 10 mg via ORAL
  Filled 2024-11-02 (×6): qty 1

## 2024-11-02 MED ORDER — POLYETHYLENE GLYCOL 3350 17 G PO PACK: 17.0000 g | PACK | Freq: Every day | ORAL | Status: AC

## 2024-11-02 MED ORDER — POLYETHYLENE GLYCOL 3350 17 G PO PACK: 17.0000 g | PACK | Freq: Every day | ORAL | Status: DC | PRN

## 2024-11-02 MED ORDER — POLYETHYLENE GLYCOL 3350 17 G PO PACK
17.0000 g | PACK | Freq: Every day | ORAL | Status: DC
  Administered 2024-11-02 – 2024-11-05 (×4): 17 g via ORAL
  Filled 2024-11-02 (×4): qty 1

## 2024-11-02 MED ORDER — ACETAMINOPHEN 325 MG PO TABS: 650.0000 mg | ORAL_TABLET | ORAL | Status: AC | PRN

## 2024-11-02 MED ORDER — SENNOSIDES-DOCUSATE SODIUM 8.6-50 MG PO TABS: 1.0000 | ORAL_TABLET | Freq: Every day | ORAL | Status: DC

## 2024-11-02 NOTE — Progress Notes (Signed)
 Occupational Therapy Session Note  Patient Details  Name: David King MRN: [id number] Date of Birth: Oct 24, 1956  Today's Date: 11/02/2024 OT Individual Time: 9299-9184 OT Individual Time Calculation (min): 75 min    Short Term Goals: Week 4:  OT Short Term Goal 1 (Week 4): STG=LTG 2/2 ELOS (LTG upgraded)  Skilled Therapeutic Interventions/Progress Updates:    Pt resting in bed upon arrival. Skilled OT intervention with initial focus on donning shoes and RLE AFO. Pt able to don Lt shoe without assistance using shoe horn as needed. Pt required min verbal cues for technique donning RLE AFO. Initial focus on BLE/BUE therex for strengthening and general conditioning. NuStep 10 mins rolling hills levels 4-9. Pt practiced std bed mobility without rails. Pt completed task with supervision using log roll technique. Functional amb with RW and standing activities to increase independence with BADLS and IADLs at discharge. CGA for all amb and transfers. Pt returned to room and remained in w/c with all needs within reach.   Therapy Documentation Precautions:  Precautions Precautions: Fall, Back Precaution Booklet Issued: Yes (comment) Recall of Precautions/Restrictions: Impaired Precaution/Restrictions Comments: watch BP (orthostatic); RLE foot drop, wound VAC Required Braces or Orthoses: Spinal Brace Spinal Brace: Thoracolumbosacral orthotic, Applied in sitting position Restrictions Weight Bearing Restrictions Per Provider Order: No RLE Weight Bearing Per Provider Order: Weight bearing as tolerated Other Position/Activity Restrictions: transfers only on RLE   Pain: Pt reports intermittent RLE (foot) pain with activity and shooting pain occasionally when seated; meds admin prior to therapy   Therapy/Group: Individual Therapy  [name] 11/02/2024, 11:06 AM

## 2024-11-02 NOTE — Progress Notes (Signed)
 Physical Therapy Note  Patient Details  Name: David King MRN: [id number] Date of Birth: [birth year] Today's Date: 11/02/2024   Physical Therapist participated in the interdisciplinary team conference, providing clinical information regarding the patient's current status, treatment goals, and weekly focus, including any barriers that need to be addressed. Please see the Inpatient Rehabilitation Team Conference and Plan of Care Update for further details.    [name] [name] [name], PT, DPT, CBIS  11/02/2024, 11:18 AM

## 2024-11-02 NOTE — Patient Care Conference (Signed)
 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 Duwaine Barrs Social Worker Present: Graeme Jude, LCSW Nurse Present: Eulalio Falls, RN PT Present: Donald Pereyra, PT OT Present: Charlena Cha, COTA;Jennifer Claudene, OT SLP Present: Blaise Alderman, SLP PPS Coordinator present : Eleanor Colon, SLP     Current Status/Progress Goal Weekly Team Focus  Bowel/Bladder      Continent of bowel and bladder     Remain continence of bowel and bladder     Assess bowel and bladder q shift  Swallow/Nutrition/ Hydration               ADL's   bathing-mn A; LB dressing-min A; transfers-CGA/supervision; toileting-mod A   supervision/mod I overall   toileting; LB dressing, standing balance/tolerance, education    Mobility   CGA gait, working on endurance and increased distances, AFO consult complete. bed mob with STS with supervision   supervision overall  endurance, distance gait training    Communication                Safety/Cognition/ Behavioral Observations               Pain      Moderate pain legs    <4 w/ prns    Assess pain q shift  Skin      Incisions CDI Right heel unstageable   Caregiver/ patient to learn wound care     Assess incision/ wound q shift    Discharge Planning:  Home with wife who can assist but does have knee issues. Wife is here daily to provide support. Pt now has WTD dx; [facility]- [facility] for HHPT/OT/SN/iade. Fam edu on Wed 10am-12pm with PT/OT and 1-2pm with RN. SW will confirm there are no barriers to discharge.    Team Discussion: Patient was admitted post right leg anterior compartment fasciotomy due to PAD/compartment syndrome and right leg ischemia. Patient progressing well with therapy and for discharge planning.   Patient on target to meet rehab goals: yes, currently nedds minimal assistance with ADLs and transfers. Patient needs CGA with gait and working on endurance and increased distances. Overall goals at discharge are set for supervision assistance.   *See Care Plan and progress notes for long and short-term goals.  Revisions to Treatment Plan:  AFO consult WOC consult Vascular consult  Teaching Needs: Safety, medications, transfers, toileting, fall precautions, wound care, incision care, etc.   Current Barriers to Discharge: Decreased caregiver support, Home enviroment access/layout, and Wound care  Possible Resolutions to Barriers: Family Education Home health follow up     Medical Summary Current Status: BP different on each arm? WOC -reconsulted- for R heel- betadine  swab and vinegar soaks; and eucerin cream; drainage of RLE-  Barriers to Discharge: Complicated Wound;Hypotension;Medical  stability;Self-care education  Barriers to Discharge Comments: wounds, off vac; R heel wound RLE incisions- back precautions; Possible Resolutions to [name]: d/c friday- might call vascular if  BP's different in each arm- con't midodrine   Continued Need for Acute Rehabilitation Level of Care: The patient requires daily medical management by a physician with specialized training in physical medicine and rehabilitation for the following reasons: Direction of a  multidisciplinary physical rehabilitation program to maximize functional independence : Yes Medical management of patient stability for increased activity during participation in an intensive rehabilitation regime.: Yes Analysis of laboratory values and/or radiology reports with any subsequent need for medication adjustment and/or medical intervention. : Yes   I attest that I was present, lead the team conference, and concur with the assessment and plan of the team.   [name] 11/03/2024, 1117 am

## 2024-11-02 NOTE — Plan of Care (Signed)
  Problem: SCI BOWEL ELIMINATION Goal: RH STG MANAGE BOWEL WITH ASSISTANCE Description: STG Manage Bowel with supervision Assistance. Outcome: Progressing   Problem: SCI BLADDER ELIMINATION Goal: RH STG MANAGE BLADDER WITH ASSISTANCE Description: STG Manage Bladder With supervision Assistance Outcome: Progressing   Problem: RH SKIN INTEGRITY Goal: RH STG SKIN FREE OF INFECTION/BREAKDOWN Description: Manage skin free of infection with supervision assistance Outcome: Progressing   Problem: RH SAFETY Goal: RH STG ADHERE TO SAFETY PRECAUTIONS W/ASSISTANCE/DEVICE Description: STG Adhere to Safety Precautions With Assistance/Device. Outcome: Progressing   Problem: RH PAIN MANAGEMENT Goal: RH STG PAIN MANAGED AT OR BELOW PT'S PAIN GOAL Description: < 4 w/ prns Outcome: Progressing   Problem: RH KNOWLEDGE DEFICIT SCI Goal: RH STG INCREASE KNOWLEDGE OF SELF CARE AFTER SCI Description: Manage increase knowledge of self care after SCI with supervision assistance from wife using educational materials provided Outcome: Progressing   Problem: Education: Goal: Knowledge of General Education information will improve Description: Including pain rating scale, medication(s)/side effects and non-pharmacologic comfort measures Outcome: Progressing   Problem: Health Behavior/Discharge Planning: Goal: Ability to manage health-related needs will improve Outcome: Progressing   Problem: Clinical Measurements: Goal: Ability to maintain clinical measurements within normal limits will improve Outcome: Progressing Goal: Will remain free from infection Outcome: Progressing Goal: Diagnostic test results will improve Outcome: Progressing Goal: Respiratory complications will improve Outcome: Progressing Goal: Cardiovascular complication will be avoided Outcome: Progressing   Problem: Activity: Goal: Risk for activity intolerance will decrease Outcome: Progressing   Problem: Nutrition: Goal:  Adequate nutrition will be maintained Outcome: Progressing   Problem: Coping: Goal: Level of anxiety will decrease Outcome: Progressing   Problem: Elimination: Goal: Will not experience complications related to bowel motility Outcome: Progressing Goal: Will not experience complications related to urinary retention Outcome: Progressing   Problem: Pain Managment: Goal: General experience of comfort will improve and/or be controlled Outcome: Progressing   Problem: Safety: Goal: Ability to remain free from injury will improve Outcome: Progressing   Problem: Skin Integrity: Goal: Risk for impaired skin integrity will decrease Outcome: Progressing

## 2024-11-02 NOTE — Progress Notes (Signed)
 Occupational Therapy Session Note  Patient Details  Name: David King MRN: [id number] Date of Birth: 1956-09-27  Today's Date: 11/02/2024 OT Individual Time: 1345-1410 OT Individual Time Calculation (min): 25 min  and Today's Date: 11/02/2024 OT Missed Time: 20 Minutes Missed Time Reason: Other (comment) (pt on toilet)   Short Term Goals: Week 4:  OT Short Term Goal 1 (Week 4): STG=LTG 2/2 ELOS (LTG upgraded)  Skilled Therapeutic Interventions/Progress Updates:    Pt resting in w/c upon arrival. Pt reports he is constipated and very uncomfortable as a result. Skilled OT intervention with focus on standing balance and functional transfers. Standing activity to complete chest presses with 2# bar 3x5 with standing rest break, followed by seated rest break. Pt repeated sequence. Pt reported he thinks he needed to use toilet. Return to room and amb with RW from doorway into bathroom. Pt able to pull pants down without assistance. Pt reported he needed to sit on toilet for awhile. Pt instructed to not stand up until staff present. Pt verbalized understanding. Nursing staff notfied. Pt missed 20 mins skilled OT intervention. Will attempt to see again as schedule allows.  Therapy Documentation Precautions:  Precautions Precautions: Fall, Back Precaution Booklet Issued: Yes (comment) Recall of Precautions/Restrictions: Impaired Precaution/Restrictions Comments: watch BP (orthostatic); RLE foot drop, wound VAC Required Braces or Orthoses: Spinal Brace Spinal Brace: Thoracolumbosacral orthotic, Applied in sitting position Restrictions Weight Bearing Restrictions Per Provider Order: No RLE Weight Bearing Per Provider Order: Weight bearing as tolerated Other Position/Activity Restrictions: transfers only on RLE General: General OT Amount of Missed Time: 20 Minutes Pain: Pt c/o 6/10 RLE (foot) pain; meds admin prior to therapy and rest as appropriate  Therapy/Group: Individual  Therapy  [name] 11/02/2024, 2:32 PM

## 2024-11-02 NOTE — Progress Notes (Signed)
 Patient ID: David King, male   DOB: [birth year], 68 y.o.   MRN: [id number]  SW went by pt room to provide updates from team conference, but pt not in the room. SW will follow-up with updates.   Graeme Jude, MSW, LCSW Office: [phone] Cell: [phone] Fax: [phone]

## 2024-11-02 NOTE — Progress Notes (Signed)
 Physical Therapy Weekly Progress Note  Patient Details  Name: SALOME COZBY MRN: [id number] Date of Birth: 25-Jul-1956  Beginning of progress report period: October 26, 2024 End of progress report period: November 02, 2024  Today's Date: 11/02/2024 PT Individual Time: 8554-8485 PT Individual Time Calculation (min): 29 min   Patient has met 3 of 3 short term goals.  Pt is still limited by endurance as he fatigues quite quickly. He is performing Sit to stand with supervision and short distance gait and transfers with CGA. AFO consult has been completed and AFO delivered.   Patient continues to demonstrate the following deficits decreased cardiorespiratoy endurance and decreased standing balance, decreased balance strategies, and difficulty maintaining precautions and therefore will continue to benefit from skilled PT intervention to increase functional independence with mobility.  Patient progressing toward long term goals..  Continue plan of care.  PT Short Term Goals Week 3:  PT Short Term Goal 1 (Week 3): pt will ambulate >100 ft with CGA PT Short Term Goal 1 - Progress (Week 3): Met PT Short Term Goal 2 (Week 3): pt will navigate x 8 steps PT Short Term Goal 2 - Progress (Week 3): Met PT Short Term Goal 3 (Week 3): pt will perform STS with supervision consistently PT Short Term Goal 3 - Progress (Week 3): Met Week 4:  PT Short Term Goal 1 (Week 4): =LTGs d/t ELOS  Skilled Therapeutic Interventions/Progress Updates:  Pt recd seated in w/c nsg present. Unrated pain during session, no intervention required. Pt reports need for disimpaction and desiring to get back to bed. CGA Stand pivot transfer and bed mobiltiy with supervision. Doffed shoes with min a. After laying down, pt with urge to urinate. Donned shoes and assisted pt to bathroom with CGA. Continent bladder void with distant supervision. Pt then returned to bed in same manner. Therapist placed new AFO in shoe for trial tomorrow.  Provided education for pt's wife on AFO and equipment recommendations. Pt remained in bed with needs in reach, missed x 15 min for nsg care.   Therapy Documentation Precautions:  Precautions Precautions: Fall, Back Precaution Booklet Issued: Yes (comment) Recall of Precautions/Restrictions: Impaired Precaution/Restrictions Comments: watch BP (orthostatic); RLE foot drop, wound VAC Required Braces or Orthoses: Spinal Brace Spinal Brace: Thoracolumbosacral orthotic, Applied in sitting position Restrictions Weight Bearing Restrictions Per Provider Order: No RLE Weight Bearing Per Provider Order: Weight bearing as tolerated Other Position/Activity Restrictions: transfers only on RLE General:      Therapy/Group: Individual Therapy  [name] 11/02/2024, 5:10 PM

## 2024-11-02 NOTE — Progress Notes (Signed)
 Physical Therapy Session Note  Patient Details  Name: David King MRN: [id number] Date of Birth: 11-Dec-1956  Today's Date: 11/02/2024 PT Individual Time: 1031-1130 PT Individual Time Calculation (min): 59 min   Short Term Goals: Week 1:  PT Short Term Goal 1 (Week 1): Pt will tolerate sitting upright >2 hours PT Short Term Goal 1 - Progress (Week 1): Met PT Short Term Goal 2 (Week 1): Pt will perform stedy transfers with mod a or better PT Short Term Goal 2 - Progress (Week 1): Met PT Short Term Goal 3 (Week 1): Pt will consistently perform supine<>sit with CGA PT Short Term Goal 3 - Progress (Week 1): Met Week 2:  PT Short Term Goal 1 (Week 2): pt will tolerate standing >60 sec without orthostatic hypotension PT Short Term Goal 1 - Progress (Week 2): Met PT Short Term Goal 2 (Week 2): Pt will ambulate 30 ft or >with LRAD PT Short Term Goal 2 - Progress (Week 2): Met PT Short Term Goal 3 (Week 2): Pt will initiate transfer training with RW PT Short Term Goal 3 - Progress (Week 2): Met Week 3:  PT Short Term Goal 1 (Week 3): pt will ambulate >100 ft with CGA PT Short Term Goal 2 (Week 3): pt will navigate x 8 steps PT Short Term Goal 3 (Week 3): pt will perform STS with supervision consistently  Skilled Therapeutic Interventions/Progress Updates:  Patient seated upright in recline back w/c on entrance to room. Patient alert and agreeable to PT session.   Patient with no pain complaint at start of session while at rest.  Therapeutic Activity: Transfers: Prior to initial rise to stand, pt able to don TLSO as it is already behind him in w/c. Completes with setup. Pt performed sit<>stand and stand pivot transfers throughout session with RW and close supervision. Provided vc/ tc for forward weight shift.   Pt guided in continuous reciprocation of BUE and BLE using rolling hills program on NuStep L3-6 x with focus on maintaining pace. He is able to maintain an average of 53 steps/  min throughout, completing 539 steps over distance of 0.3 mi. Averages 1.8 METs during bout.  Gait Training:  Pt ambulated 150' x1/ 45' x2/ 61' x1 using RW with CGA. Demonstrated step to gait pattern initially during first bout. Subsequent bouts occur following Nustep and with increased focus on bil step lengths and focus to RLE strength. AFO donned throughout.  Patient seated upright in room at end of session with brakes locked, no alarm set as pt's wife is present for supervision, and all needs within reach.   Therapy Documentation Precautions:  Precautions Precautions: Fall, Back Precaution Booklet Issued: Yes (comment) Recall of Precautions/Restrictions: Impaired Precaution/Restrictions Comments: watch BP (orthostatic); RLE foot drop, wound VAC Required Braces or Orthoses: Spinal Brace Spinal Brace: Thoracolumbosacral orthotic, Applied in sitting position Restrictions Weight Bearing Restrictions Per Provider Order: No RLE Weight Bearing Per Provider Order: Weight bearing as tolerated Other Position/Activity Restrictions: transfers only on RLE  Pain:  Pt main complaint this session with fatigue in BLE.   Therapy/Group: Individual Therapy  Mliss DELENA Milliner PT, DPT, CSRS 11/01/2024, 4:57 PM

## 2024-11-02 NOTE — Progress Notes (Signed)
 Physical Therapy Session Note  Patient Details  Name: David King MRN: [id number] Date of Birth: [birth year]  Today's Date: 11/02/2024 PT Individual Time: 0835-0900 PT Individual Time Calculation (min): 25 min   Short Term Goals: Week 1:  PT Short Term Goal 1 (Week 1): Pt will tolerate sitting upright >2 hours PT Short Term Goal 1 - Progress (Week 1): Met PT Short Term Goal 2 (Week 1): Pt will perform stedy transfers with mod a or better PT Short Term Goal 2 - Progress (Week 1): Met PT Short Term Goal 3 (Week 1): Pt will consistently perform supine<>sit with CGA PT Short Term Goal 3 - Progress (Week 1): Met  Skilled Therapeutic Interventions/Progress Updates: Patient sitting in reclining WC on entrance to room. Patient alert and agreeable to PT session.   Patient with no complaints of pain. Pt reported new ability to activate plantarflexors on R LE (session focus on therex). Pt transported from room<>day room gym in reclining WC for time management.   Therapeutic Exercise: Pt performed the following exercises with therapist providing the described cuing and facilitation for improvement. - Mass reps of R plantarflexor (soleus focus with knee bent seated) with sponge cut in half placed on front of foot as tactile cuing and to increase plantarflexor strength  - PTA passively moved R ankle into plantarflexion (stacked discs placed under front of foot to increase ROM) with cues for pt to control eccentric.  - AROM of R plantarflexors with front of foot stacked on disc with noted observable increase of ROM (not formally measured) x 2 sets till fatigue.  Patient sitting in reclining WC at end of session with brakes locked, and all needs within reach.      Therapy Documentation Precautions:  Precautions Precautions: Fall, Back Precaution Booklet Issued: Yes (comment) Recall of Precautions/Restrictions: Impaired Precaution/Restrictions Comments: watch BP (orthostatic); RLE foot drop,  wound VAC Required Braces or Orthoses: Spinal Brace Spinal Brace: Thoracolumbosacral orthotic, Applied in sitting position Restrictions Weight Bearing Restrictions Per Provider Order: No RLE Weight Bearing Per Provider Order: Weight bearing as tolerated Other Position/Activity Restrictions: transfers only on RLE  Therapy/Group: Individual Therapy  Nashanti Duquette PTA 11/02/2024, 12:05 PM

## 2024-11-02 NOTE — Progress Notes (Signed)
 PROGRESS NOTE   Subjective/Complaints:  Pt reports BP was >100 systolic this Am in R arm, but LUE always higher than RUE-  BP not symptomatic/OH anymore since increased Midodrine.   Per therapy, biggest issues is toileting- and trying for mod I to wipe-   Pt worried about BP different in both arms and what that means- will have nursing recheck in different arms  ROS: as per HPI   Pt denies SOB, abd pain, CP, N/V/C/D, and vision changes    Low BP (+)   Objective:   No results found.   Recent Labs    11/01/24 0302  WBC 5.8  HGB 10.3*  HCT 33.7*  PLT 165     Recent Labs    11/01/24 0302  NA 140  K 4.1  CL 106  CO2 24  GLUCOSE 99  BUN 19  CREATININE 1.10  CALCIUM  8.9      Intake/Output Summary (Last 24 hours) at 11/02/2024 0957 Last data filed at 11/02/2024 0741 Gross per 24 hour  Intake 1012 ml  Output --  Net 1012 ml     Wound 10/11/24 1630 Pressure Injury Heel Right Unstageable - Full thickness tissue loss in which the base of the injury is covered by slough (yellow, tan, gray, green or brown) and/or eschar (tan, brown or black) in the wound bed. (Active)     Wound 10/22/24 0945 Pressure Injury Foot Right Stage 1 -  Intact skin with non-blanchable redness of a localized area usually over a bony prominence. (Active)    Physical Exam: Vital Signs Blood pressure 109/84, pulse 89, temperature 98.4 F (36.9 C), resp. rate 15, height 5' 9 (1.753 m), weight 86.2 kg, SpO2 96%.      General: awake, alert, appropriate, 100%; OT in room; NAD HENT: conjugate gaze; oropharynx moist CV: regular rate and rhythm; no JVD Pulmonary: CTA B/L; no W/R/R- good air movement GI: soft, NT, ND, (+)BS Psychiatric: appropriate Neurological: Ox3  Ext: no clubbing, cyanosis, or edema although RLE ACE wrapped this AM- cannot assess on direct visual exam   PRIOR EXAMS: General: Skin is warm. Wounds presenting  still similar to below -vac removed from right lower leg -assessed wounds /incisions via pics- sutures stretches across- but great granulation tissue- very superficial, healing well.  Assessed pics of R heel and leg- as below         Neurological:     Comments: Patient is alert.     Oriented x 3 and follows commands. CN exam non-focal. Fair insight and awareness. MMT: BUE 5/5. LLE limited by  pain but appears to be moving all muscle groups. RLE limited by wounds/dressings, etc. Sensory exam normal for light touch and pain except for right foot where there is diminished LT along dorsum and to a lesser extent plantar aspect of foot.  No limb ataxia or cerebellar signs. No abnormal tone appreciated.     Musc: right heel cord tight- still about  minus 10-deg      Assessment/Plan: 1. Functional deficits which require 3+ hours per day of interdisciplinary therapy in a comprehensive inpatient rehab setting. Physiatrist is providing close team supervision and 24 hour management of  active medical problems listed below. Physiatrist and rehab team continue to assess barriers to discharge/monitor patient progress toward functional and medical goals  Care Tool:  Bathing    Body parts bathed by patient: Right arm, Left arm, Chest, Abdomen, Face   Body parts bathed by helper: Front perineal area, Buttocks, Right upper leg, Left lower leg, Right lower leg, Left upper leg     Bathing assist Assist Level: Total Assistance - Patient < 25%     Upper Body Dressing/Undressing Upper body dressing   What is the patient wearing?: Pull over shirt, Orthosis    Upper body assist Assist Level: Supervision/Verbal cueing    Lower Body Dressing/Undressing Lower body dressing      What is the patient wearing?: Pants     Lower body assist Assist for lower body dressing: Supervision/Verbal cueing     Toileting Toileting    Toileting assist Assist for toileting: Minimal Assistance - Patient > 75%      Transfers Chair/bed transfer  Transfers assist  Chair/bed transfer activity did not occur: Safety/medical concerns  Chair/bed transfer assist level: Supervision/Verbal cueing     Locomotion Ambulation   Ambulation assist   Ambulation activity did not occur: Safety/medical concerns  Assist level: 2 helpers Assistive device: Walker-rolling Max distance: 145'   Walk 10 feet activity   Assist  Walk 10 feet activity did not occur: Safety/medical concerns  Assist level: 2 helpers Assistive device: Walker-rolling   Walk 50 feet activity   Assist Walk 50 feet with 2 turns activity did not occur: Safety/medical concerns  Assist level: 2 helpers Assistive device: Walker-rolling    Walk 150 feet activity   Assist Walk 150 feet activity did not occur: Safety/medical concerns  Assist level: 2 helpers Assistive device: Walker-rolling    Walk 10 feet on uneven surface  activity   Assist Walk 10 feet on uneven surfaces activity did not occur: Safety/medical concerns         Wheelchair     Assist Is the patient using a wheelchair?: Yes Type of Wheelchair: Manual Wheelchair activity did not occur: Safety/medical concerns  Wheelchair assist level: Supervision/Verbal cueing Max wheelchair distance: >200'    Wheelchair 50 feet with 2 turns activity    Assist    Wheelchair 50 feet with 2 turns activity did not occur: Safety/medical concerns   Assist Level: Supervision/Verbal cueing   Wheelchair 150 feet activity     Assist  Wheelchair 150 feet activity did not occur: Safety/medical concerns   Assist Level: Supervision/Verbal cueing   Blood pressure 109/84, pulse 89, temperature 98.4 F (36.9 C), resp. rate 15, height 5' 9 (1.753 m), weight 86.2 kg, SpO2 96%.  Medical Problem List and Plan: 1. Functional deficits secondary to PAD/compartment syndrome/right leg ischemia/status post thrombectomy.  Status post right leg anterior compartment  fasciotomy 9/26 with multiple washouts as well as excisional debridement/wound VAC.  Patient is completing a course of doxycycline             -patient may not yet shower             -ELOS/Goals: 11/11,  supervision goals  -ordered PRAFO to help stretch right heel cord  D/c 11/11 Pt wants to leave this week if possible-  Con't CIR PT and OT Team conference today to f/u n progress 2.  Antithrombotics: -DVT/anticoagulation:  Pharmaceutical: now on eliquis 5mg  BID             -antiplatelet therapy: Aspirin  81 mg daily 3.  Pain Management: Lyrica  150 mg 3 times daily,  tylenol  prn.    10/22 doing well with tylenol  alone. Off all narcotics (prefers this) 10/24 pt doesn't want to increase lyrica  any further at this point (not too much higher it can go) 10/27- pain tolerable- spikes ~ 12x/day but going well otherwise with tylenol  alone 11/3-11/4  pain mainly tolerable- doesn't want other meds 4. Mood/Behavior/Sleep: Cymbalta  30 mg nightly, Celexa  20 mg daily             -antipsychotic agents: N/A  -ambien  PRN 5. Neuropsych/cognition: This patient is capable of making decisions on his own behalf. 6. Skin/Wound Care: Routine skin checks -vac removed-->Vashe soaked gauze over wound,dry gauze, kerlix, ACE --daily change -local care and pressure relief as needed to multiple other wounds. On doxycycline             -prevalon boot/pressure relief, right heel wound  10/28- Said needed vinegar for a wound on R heel- will d/w nursing  10/30- assessed RLE wounds- yesterday-  looking better and heel stable 10/31- went over pics- heel about the same- asked nursing to get vinegar soaks 11/3- changed the wound care orders per nursing coordinator 7. Fluids/Electrolytes/Nutrition: Routine and analysis with follow-up chemistries, continue vitamins/supplements.   8. Lumbar radiculopathy/large disc herniation spinal stenosis.  Status post L4-5 transforaminal interbody fusion/posterior lateral fusion insertion  interbody device L4-5 decompression 09/08/2024 per Dr. Beuford.  Back brace when out of bed  -10/24--Dr. Letitia revised TLSO to LSO which he needs to wear when standing or ambulating   -back precautions ongoing  11/4- working on not breaking back precautions and wiping to have BM- ongoing therapy for this 9.  Acute blood loss anemia.  Follow-up CBC 10/14- Pt has ~ 150-200cc out of VAC- not sure since when? Hb down 1/2 unit from 8.9 to 8.4- will recheck Thursday  10/15- Hb will check in AM  10/17- Hb 9.0  10/27- Hb 9.9- VAC off--> up to 10.5 10/31  11/3- Hb 10.3- plts down somewhat to 165k- from 243k- will recheck before d/c  10.  AKI.  Resolved after IV hydration. 11.  Hyperlipidemia.  Crestor  20mg  daily 12.  COPD/remote tobacco use.  Monitor oxygen  saturations every shift 10/15- was on O2 this AM- per nursing from overnight only- but Sats 89-91%- getting CXR and ABG to see where he is- and getting sepsis work up.   10/16- started on Unasyn and will start Mucinex for coarse cough-   11/3- occ COPD cough, but sounds clear 13.  History of prostate cancer/urinary retention.  History of status post prostatectomy.  External catheter currently in place.  Continue Flomax  0.4 mg daily 10/14- is voiding- but had urinary retention last time- will check a few bladder scans.  10/15 - pt needing in/out caths- had >1400cc in bladder- has cathing protocol now 10/16-10/17 has orders to bladder scan and in/out if volumes >350cc- requiring caths sometimes  10/18 may need to cath more frequently if he's having volumes >1000cc  10/19 cath volumes a little less yesterday--continue with current mgt  10/24 still having intermittent retention   -continue low dose urecholine at high volumes   -increase urecholine to 25mg  tid, continue flomax    -up to EOB/toilet to empty 10/29-11/2 no caths required- voiding well now 11/4- Will reduce Urecholine to 10 mg TID to try and wean off before d/c 14.  History of detached  retina left eye.  Follow-up outpatient ophthalmology. 15. Severe constipation 10/14- LBM at least 4-5 days  ago per documentation- pt doesn't remember when went last- will get KUB- and order Sorbitol  30cc- and SSE if no results- having abd pain.  10/16- Pt had 1 liquid stool- but otherwise, no other stools in 6-7 days- last KUB shows on 10/14 was full of stool- will give Sorbitol  since likely has hard stool that liquid getting by- and SSE- if that doesn't work, will ask nursing to do Mg citrate.   10/17- had massive BM  per pt- per chart 1 large, 1 medium-   10/18 smear yesterday. Continue senna-s and increase miralax  to bid. 10/29-10/31- Having Bms pretty much daily now, that off tylenol   11/4- going daily 16.  Hypokalemia  10/14- K+ 3.2- will replete KCL 40 Meq x1. -- fine on 10/23 10/27- K+ 3.8 17. Low grade temp in setting of delirium  10/15- will get sepsis work up, ammonia level and ABG  10/17- has HCAP- Per #21 18. Cirrhosis on last Abd imaging  10/15- will get Ammonia as well esp with new jerking/tremors.  10/16- is actually shivering this AM, but isn't cold- Ammonia 16- not the cause of Sx's.   10/20- ALT is 47- slightly up but AST down to 37- can con't tylenol  as ordered  10/28- will check CMP next time 10/30- changed to tomorrow- hasn't been drawn yet this AM at of 11am  10/31- Labs not done yet from yesterday or today- checking with nursing as to why-- LFTs normalized 19. Infrarenal aortic aneurysm with mural thrombus- 6.8 cm on last imaging   20. Orthostatic hypotension 10/16- On Flomax , however No BP meds that I can reduce- will check with therapy- if dropping more, will add Midodrine- ordering ACE wrap on LLE  -addendum- adding Midodrine 5 mg TID starting at 630 am  10/19 bp's a little more robust over weekend thus far. No changes today  10/22-24 improved on midodrine 10/28- 10/30- BP doing better on Midodrine- still a little soft, but better. Con't to monitor 10/31- will  increase midodrine to 10 mg TID_ and changed times to 630, 1030 and 230-  -11/1-2/25 BPs stable, occasionally soft, has fluids running @100ml /hr 11/3- OFF IVFs- will have nursing check BP in B/L UEs and see if different- if so , will call Vascular 11/4- spoke with nursing about getting BP's in different arms Vitals:   10/29/24 1554 10/29/24 2151 10/30/24 0534 10/30/24 1327  BP: 91/68 120/67 111/67 108/68   10/30/24 1943 10/31/24 0433 10/31/24 1328 10/31/24 2004  BP: (!) 100/54 104/75 134/81 120/67   11/01/24 0335 11/01/24 1300 11/01/24 1949 11/02/24 0519  BP: (!) 134/94 98/67 93/62  109/84    21. HCAP 10/16- on Unasyn-  will con't for 5-6 days- monitor WBC- down to 12.7 from 13.2 last evening. Shivering could be due to breaking a temp>>?? 10/17- no tremors/shivering- will recheck CBC in AM- and make sure on right ABX-  and CMP due to LFTS/renal check on IV ABX- Added Albuterol  scheduled QID due to wheezing and bad COPD   10/20- WBC down to 8.2-   Unasyn completed  22. Urinary dropping urinal  11/3- Asked nursing to get male urinal to help    I spent a total of  41  minutes on total care today- >50% coordination of care- due to  D/w pt about Function as well as OT_ then team conference to f/u on progress- also reduced Urecholine and d/w nursing about BP in different arms- to double check  LOS: 22 days A FACE TO FACE EVALUATION WAS  PERFORMED  Ronney [employer]well 11/02/2024, 9:57 AM

## 2024-11-02 NOTE — Progress Notes (Signed)
 Occupational Therapy Note  Patient Details  Name: David King MRN: [id number] Date of Birth: Feb 05, 1956  Occupational Therapy Assistant participated in the interdisciplinary team conference, providing clinical information regarding the patient's current status, treatment goals, and weekly focus, including any barriers that need to be addressed. Please see the Inpatient Rehabilitation Team Conference and Plan of Care Update for further details.    Maritza [facility] 11/02/2024, 2:39 PM

## 2024-11-02 NOTE — Plan of Care (Signed)
  Problem: RH Balance Goal: LTG: Patient will maintain dynamic sitting balance (OT) Description: LTG:  Patient will maintain dynamic sitting balance with assistance during activities of daily living (OT) Flowsheets (Taken 11/02/2024 0829) LTG: Pt will maintain dynamic sitting balance during ADLs with: (upgraded JLS) Independent with assistive device Note: upgraded JLS  Goal: LTG Patient will maintain dynamic standing with ADLs (OT) Description: LTG:  Patient will maintain dynamic standing balance with assist during activities of daily living (OT)  Flowsheets (Taken 11/02/2024 0829) LTG: Pt will maintain dynamic standing balance during ADLs with: (upgraded JLS) Supervision/Verbal cueing Note: upgraded JLS    Problem: RH Grooming Goal: LTG Patient will perform grooming w/assist,cues/equip (OT) Description: LTG: Patient will perform grooming with assist, with/without cues using equipment (OT) Flowsheets (Taken 11/02/2024 0829) LTG: Pt will perform grooming with assistance level of: (upgraded JLS) Independent with assistive device  Note: upgraded JLS    Problem: RH Bathing Goal: LTG Patient will bathe all body parts with assist levels (OT) Description: LTG: Patient will bathe all body parts with assist levels (OT) Flowsheets (Taken 11/02/2024 0829) LTG: Pt will perform bathing with assistance level/cueing: (upgraded JLS) Supervision/Verbal cueing Note: upgraded JLS    Problem: RH Dressing Goal: LTG Patient will perform upper body dressing (OT) Description: LTG Patient will perform upper body dressing with assist, with/without cues (OT). Flowsheets (Taken 11/02/2024 0829) LTG: Pt will perform upper body dressing with assistance level of: (upgraded JLS) Independent with assistive device Note: upgraded JLS  Goal: LTG Patient will perform lower body dressing w/assist (OT) Description: LTG: Patient will perform lower body dressing with assist, with/without cues in positioning using equipment  (OT) Flowsheets (Taken 11/02/2024 0829) LTG: Pt will perform lower body dressing with assistance level of: (upgraded JLS) Supervision/Verbal cueing Note: upgraded JLS    Problem: RH Toilet Transfers Goal: LTG Patient will perform toilet transfers w/assist (OT) Description: LTG: Patient will perform toilet transfers with assist, with/without cues using equipment (OT) Flowsheets (Taken 11/02/2024 0829) LTG: Pt will perform toilet transfers with assistance level of: (upgraded JLS) Independent with assistive device Note: upgraded JLS    Problem: RH Tub/Shower Transfers Goal: LTG Patient will perform tub/shower transfers w/assist (OT) Description: LTG: Patient will perform tub/shower transfers with assist, with/without cues using equipment (OT) Flowsheets (Taken 11/02/2024 0829) LTG: Pt will perform tub/shower stall transfers with assistance level of: (upgraded JLS) Supervision/Verbal cueing Note: upgraded JLS

## 2024-11-03 DIAGNOSIS — Z9889 Other specified postprocedural states: Secondary | ICD-10-CM | POA: Diagnosis not present

## 2024-11-03 MED ORDER — SENNOSIDES-DOCUSATE SODIUM 8.6-50 MG PO TABS
2.0000 | ORAL_TABLET | Freq: Every day | ORAL | Status: DC
  Administered 2024-11-03: 2 via ORAL
  Filled 2024-11-03: qty 2

## 2024-11-03 NOTE — Progress Notes (Signed)
 Wound supplies for home given to patient.

## 2024-11-03 NOTE — Progress Notes (Signed)
 Occupational Therapy Session Note  Patient Details  Name: David King MRN: [id number] Date of Birth: 04-15-1956  Today's Date: 11/03/2024 OT Individual Time: 1100-1155 OT Individual Time Calculation (min): 55 min    Short Term Goals: Week 4:  OT Short Term Goal 1 (Week 4): STG=LTG 2/2 ELOS (LTG upgraded)  Skilled Therapeutic Interventions/Progress Updates:    Skilled OT intervention with focus on family education with wife. Pt in considerable pain and worn out from earlier therapies. See pain below. Reviewed pt's current functional status (supervision/mod I.) Reviewed home safety recommendations. Pt and wife pleased with progress and looking forward to d/c 11/7. Discussed use of BSC during night to avoid waling to bathroom during the night to reduce risk of falling. Answered all questions or related to [facility]. Pt remained in w/c with all needs within reach. Wife present.   Therapy Documentation Precautions:  Precautions Precautions: Fall, Back Precaution Booklet Issued: Yes (comment) Recall of Precautions/Restrictions: Impaired Precaution/Restrictions Comments: watch BP (orthostatic); RLE foot drop, wound VAC Required Braces or Orthoses: Spinal Brace Spinal Brace: Thoracolumbosacral orthotic, Applied in sitting position Restrictions Weight Bearing Restrictions Per Provider Order: No RLE Weight Bearing Per Provider Order: Weight bearing as tolerated Other Position/Activity Restrictions: transfers only on RLE   Pain: Pt c/o 9/10 Rt heel pain; meds requested and admin, AFO removed with some relief    Therapy/Group: Individual Therapy  [name] 11/03/2024, 12:06 PM

## 2024-11-03 NOTE — Progress Notes (Signed)
 Physical Therapy Session Note  Patient Details  Name: David King MRN: [id number] Date of Birth: 05-04-1956  Today's Date: 11/03/2024 PT Individual Time: 1000-1100 PT Individual Time Calculation (min): 60 min   Short Term Goals: Week 4:  PT Short Term Goal 1 (Week 4): =LTGs d/t ELOS  Skilled Therapeutic Interventions/Progress Updates:      Pt seated in WC upon arrival. Pt agreeable to therapy. Pt endorses 4/10 R foot pain premedicated--no intervention needed.   Education provided regarding pt deficits with emphasis on fatigue/endurance limitations and pt requiring CGA/close supervision with mobility. Recommending use of AFO for mobility. Education provided for how to donn. Pt and pt wife returned demonstration.   Pt wife, [name] present for family training. PT answered pt and pt wife questions regarding follow up HHPT/nursing. Education provided regarding family training with nursing scheduled at 1:00 to practice dressing changes to R LE.   Pt performed short distance ambulatory car transfer to car simulator at height of SUV with RW and close supervision from pt wife. 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 PT recommended pt wife park as close to the home entry as possible for pt safety. Pt and pt wife ambulated a total of 76 feet with RW and CGA up/down ramp, and across mulch to simulate home entry. PT recommended pt have chair at bottom and top of stairs to allow seated rest break. Pt endorses he already has both of these set up from baseline LBP and endurance deficits.   Pt performed short distance ambulatory transfer to queen size bed in rehab apartment with RW and supervision. Pt endorses need to use bathroom. Pt ambulated bed to [facility] over toilet with RW and close supervision. Pt endorses he has a BSC at home. Pt unable to use bathroom.   Pt navigated 2x4 6 inch steps with R UE support on unilateral handrail and step to gait and CGA,  first trail with PT, 2nd trial with pt wife.  Education provided for sequencing. Education provided on option to utilize B UE support on unilateral as needed with fatigue.   Pt seated in WC at end of session with all needs within reach and chair alarm on.       Therapy Documentation Precautions:  Precautions Precautions: Fall, Back Precaution Booklet Issued: Yes (comment) Recall of Precautions/Restrictions: Impaired Precaution/Restrictions Comments: watch BP (orthostatic); RLE foot drop, wound VAC Required Braces or Orthoses: Spinal Brace Spinal Brace: Thoracolumbosacral orthotic, Applied in sitting position Restrictions Weight Bearing Restrictions Per Provider Order: No RLE Weight Bearing Per Provider Order: Weight bearing as tolerated Other Position/Activity Restrictions: transfers only on RLE   Therapy/Group: Individual Therapy  [facility] Doreene Orris, [place], DPT  11/03/2024, 9:47 AM

## 2024-11-03 NOTE — Progress Notes (Signed)
 Physical Therapy Session Note  Patient Details  Name: David King MRN: [id number] Date of Birth: 1956-04-27  Today's Date: 11/03/2024 PT Individual Time: 1410-1440 PT Individual Time Calculation (min): 30 min   Short Term Goals: Week 4:  PT Short Term Goal 1 (Week 4): =LTGs d/t ELOS  Skilled Therapeutic Interventions/Progress Updates: Pt presented in bed with wife present agreeable to therapy. Pt states pain controlled with intermittent bouts of pain at buttocks, no intervention requested. Session focused on education in preparation for d/c. Pt and wife are pleased with progress and comfortable with d/c plans. Provided edu on importance of energy conservation and allow time for progression of healing. Pt states was able to perform heel lift recent and has really motivated pt. Pt was able to perform heel lift pushing off ball in bed 2 x 10. PTA provided manual stretching to R hamstring and incorporating contract relax for increased stretch. Pt also performed manually resisted leg press x 10. Pt remained in bed at end of session with call bell within reach and wife present.      Therapy Documentation Precautions:  Precautions Precautions: Fall, Back Precaution Booklet Issued: Yes (comment) Recall of Precautions/Restrictions: Impaired Precaution/Restrictions Comments: watch BP (orthostatic); RLE foot drop, wound VAC Required Braces or Orthoses: Spinal Brace Spinal Brace: Thoracolumbosacral orthotic, Applied in sitting position Restrictions Weight Bearing Restrictions Per Provider Order: No RLE Weight Bearing Per Provider Order: Weight bearing as tolerated Other Position/Activity Restrictions: transfers only on RLE General:   Vital Signs: Therapy Vitals Temp: 98.2 F (36.8 C) Temp Source: Oral Pulse Rate: (!) 102 Resp: 18 BP: 118/72 Patient Position (if appropriate): Sitting Oxygen  Therapy SpO2: 98 % O2 Device: Room Air    Therapy/Group: Individual Therapy  [name] 11/03/2024, 4:26 PM

## 2024-11-03 NOTE — Progress Notes (Signed)
 Occupational Therapy Session Note  Patient Details  Name: David King MRN: [id number] Date of Birth: March 16, 1956  Today's Date: 11/03/2024 OT Individual Time: 9149-9054 OT Individual Time Calculation (min): 55 min    Short Term Goals: Week 4:  OT Short Term Goal 1 (Week 4): STG=LTG 2/2 ELOS (LTG upgraded)  Skilled Therapeutic Interventions/Progress Updates:  Pt greeted seated in w/c, pt agreeable to OT intervention.      Transfers/bed mobility/functional mobility:  Pt completed functional ambulation ~ 50 ft with RW and CGA with R AFO donned, w/c follow for safety.   Therapeutic activity:  Pt completed various dynamic balance tasks with a focus on dynamic reaching tasks while standing on airex. Pt instructed to stand on airex and reach one UE up at a time to place bean bags in basketball goal with a focus on standing with unilateral support for ADL tasks. Pt completed task with CGA.   Pt completed targeted stepping task with pt instructed to stand on airex with BUE support from // bars and step to indicated spots on floor with a focus on maintain balance in split stance. Pt completed task with MIN A for balance.     Ended session with pt seated in w/c with all needs within reach.   Precautions:  Precautions Precautions: Fall, Back Precaution Booklet Issued: Yes (comment) Recall of Precautions/Restrictions: Impaired Precaution/Restrictions Comments: watch BP (orthostatic); RLE foot drop, wound VAC Required Braces or Orthoses: Spinal Brace Spinal Brace: Thoracolumbosacral orthotic, Applied in sitting position Restrictions Weight Bearing Restrictions Per Provider Order: No RLE Weight Bearing Per Provider Order: Weight bearing as tolerated Other Position/Activity Restrictions: transfers only on RLE  Pain: No pain reported   Therapy/Group:  [name] 11/03/2024, 12:05 PM

## 2024-11-03 NOTE — Progress Notes (Signed)
 PROGRESS NOTE   Subjective/Complaints:  Pt reports less bothered by low BP- although still lower in RUE than LUE.    BP was 30 points difference between arms    Has been constipated x4 days- had small BM last night with manual disimpaction- and small overnight- - still feels constipated.  Doesn't want sorbitol  quite yet- but will take if needed.   Feels bowel issues are med related.   Happy with new d/c date this Friday ROS: as per HPI   Pt denies SOB, abd pain, CP, N/V/ (+)C/D, and vision changes    Low BP (+)- improving   Objective:   No results found.   Recent Labs    11/01/24 0302  WBC 5.8  HGB 10.3*  HCT 33.7*  PLT 165     Recent Labs    11/01/24 0302  NA 140  K 4.1  CL 106  CO2 24  GLUCOSE 99  BUN 19  CREATININE 1.10  CALCIUM  8.9      Intake/Output Summary (Last 24 hours) at 11/03/2024 0850 Last data filed at 11/03/2024 9272 Gross per 24 hour  Intake 1253 ml  Output --  Net 1253 ml     Wound 10/11/24 1630 Pressure Injury Heel Right Unstageable - Full thickness tissue loss in which the base of the injury is covered by slough (yellow, tan, gray, green or brown) and/or eschar (tan, brown or black) in the wound bed. (Active)     Wound 10/22/24 0945 Pressure Injury Foot Right Stage 1 -  Intact skin with non-blanchable redness of a localized area usually over a bony prominence. (Active)    Physical Exam: Vital Signs Blood pressure 115/72, pulse 91, temperature 98.2 F (36.8 C), temperature source Oral, resp. rate 18, height 5' 9 (1.753 m), weight 86.2 kg, SpO2 93%.       General: awake, alert, appropriate, sitting up in bed; finished tray; NAD HENT: conjugate gaze; oropharynx moist CV: regular rate and rhythm; no JVD Pulmonary: CTA B/L; no W/R/R- good air movement GI: soft, NT, mildly distended; hypoactive BS Psychiatric: appropriate- bright Neurological: Ox3   Ext: no  clubbing, cyanosis, or edema although RLE ACE wrapped this AM- cannot assess on direct visual exam   PRIOR EXAMS: General: Skin is warm. Wounds presenting still similar to below -vac removed from right lower leg -assessed wounds /incisions via pics- sutures stretches across- but great granulation tissue- very superficial, healing well.  Assessed pics of R heel and leg- as below         Neurological:     Comments: Patient is alert.     Oriented x 3 and follows commands. CN exam non-focal. Fair insight and awareness. MMT: BUE 5/5. LLE limited by  pain but appears to be moving all muscle groups. RLE limited by wounds/dressings, etc. Sensory exam normal for light touch and pain except for right foot where there is diminished LT along dorsum and to a lesser extent plantar aspect of foot.  No limb ataxia or cerebellar signs. No abnormal tone appreciated.     Musc: right heel cord tight- still about  minus 10-deg  Assessment/Plan: 1. Functional deficits which require 3+ hours per day of interdisciplinary therapy in a comprehensive inpatient rehab setting. Physiatrist is providing close team supervision and 24 hour management of active medical problems listed below. Physiatrist and rehab team continue to assess barriers to discharge/monitor patient progress toward functional and medical goals  Care Tool:  Bathing    Body parts bathed by patient: Right arm, Left arm, Chest, Abdomen, Face   Body parts bathed by helper: Front perineal area, Buttocks, Right upper leg, Left lower leg, Right lower leg, Left upper leg     Bathing assist Assist Level: Total Assistance - Patient < 25%     Upper Body Dressing/Undressing Upper body dressing   What is the patient wearing?: Pull over shirt, Orthosis    Upper body assist Assist Level: Supervision/Verbal cueing    Lower Body Dressing/Undressing Lower body dressing      What is the patient wearing?: Pants     Lower body  assist Assist for lower body dressing: Supervision/Verbal cueing     Toileting Toileting    Toileting assist Assist for toileting: Minimal Assistance - Patient > 75%     Transfers Chair/bed transfer  Transfers assist  Chair/bed transfer activity did not occur: Safety/medical concerns  Chair/bed transfer assist level: Supervision/Verbal cueing     Locomotion Ambulation   Ambulation assist   Ambulation activity did not occur: Safety/medical concerns  Assist level: 2 helpers Assistive device: Walker-rolling Max distance: 145'   Walk 10 feet activity   Assist  Walk 10 feet activity did not occur: Safety/medical concerns  Assist level: 2 helpers Assistive device: Walker-rolling   Walk 50 feet activity   Assist Walk 50 feet with 2 turns activity did not occur: Safety/medical concerns  Assist level: 2 helpers Assistive device: Walker-rolling    Walk 150 feet activity   Assist Walk 150 feet activity did not occur: Safety/medical concerns  Assist level: 2 helpers Assistive device: Walker-rolling    Walk 10 feet on uneven surface  activity   Assist Walk 10 feet on uneven surfaces activity did not occur: Safety/medical concerns         Wheelchair     Assist Is the patient using a wheelchair?: Yes Type of Wheelchair: Manual Wheelchair activity did not occur: Safety/medical concerns  Wheelchair assist level: Supervision/Verbal cueing Max wheelchair distance: >200'    Wheelchair 50 feet with 2 turns activity    Assist    Wheelchair 50 feet with 2 turns activity did not occur: Safety/medical concerns   Assist Level: Supervision/Verbal cueing   Wheelchair 150 feet activity     Assist  Wheelchair 150 feet activity did not occur: Safety/medical concerns   Assist Level: Supervision/Verbal cueing   Blood pressure 115/72, pulse 91, temperature 98.2 F (36.8 C), temperature source Oral, resp. rate 18, height 5' 9 (1.753 m), weight 86.2  kg, SpO2 93%.  Medical Problem List and Plan: 1. Functional deficits secondary to PAD/compartment syndrome/right leg ischemia/status post thrombectomy.  Status post right leg anterior compartment fasciotomy 9/26 with multiple washouts as well as excisional debridement/wound VAC.  Patient is completing a course of doxycycline             -patient may not yet shower             -ELOS/Goals: 11/11,  supervision goals  -ordered PRAFO to help stretch right heel cord  D/c moved to 11/7 Con't CIR PT and OT Will call Vascular about BP difference in each arm- see if  I'm calling the right service. Will also touch base about sutures in R leg.   2.  Antithrombotics: -DVT/anticoagulation:  Pharmaceutical: now on eliquis 5mg  BID             -antiplatelet therapy: Aspirin  81 mg daily 3. Pain Management: Lyrica  150 mg 3 times daily,  tylenol  prn.    10/22 doing well with tylenol  alone. Off all narcotics (prefers this) 10/24 pt doesn't want to increase lyrica  any further at this point (not too much higher it can go) 10/27- pain tolerable- spikes ~ 12x/day but going well otherwise with tylenol  alone 11/3-11/4  pain mainly tolerable- doesn't want other meds 4. Mood/Behavior/Sleep: Cymbalta  30 mg nightly, Celexa  20 mg daily             -antipsychotic agents: N/A  -ambien  PRN 5. Neuropsych/cognition: This patient is capable of making decisions on his own behalf. 6. Skin/Wound Care: Routine skin checks -vac removed-->Vashe soaked gauze over wound,dry gauze, kerlix, ACE --daily change -local care and pressure relief as needed to multiple other wounds. On doxycycline             -prevalon boot/pressure relief, right heel wound  10/28- Said needed vinegar for a wound on R heel- will d/w nursing  10/30- assessed RLE wounds- yesterday-  looking better and heel stable 10/31- went over pics- heel about the same- asked nursing to get vinegar soaks 11/3- changed the wound care orders per nursing coordinator 11/5-  Wound looks more beefy red for incision- heel looks about the same- a lot of eschar. Same with elbow- but top of foot looks great 7. Fluids/Electrolytes/Nutrition: Routine and analysis with follow-up chemistries, continue vitamins/supplements.   8. Lumbar radiculopathy/large disc herniation spinal stenosis.  Status post L4-5 transforaminal interbody fusion/posterior lateral fusion insertion interbody device L4-5 decompression 09/08/2024 per Dr. Beuford.  Back brace when out of bed  -10/24--Dr. Letitia revised TLSO to LSO which he needs to wear when standing or ambulating   -back precautions ongoing  11/4- working on not breaking back precautions and wiping to have BM- ongoing therapy for this 9.  Acute blood loss anemia.  Follow-up CBC 10/14- Pt has ~ 150-200cc out of VAC- not sure since when? Hb down 1/2 unit from 8.9 to 8.4- will recheck Thursday  10/15- Hb will check in AM  10/17- Hb 9.0  10/27- Hb 9.9- VAC off--> up to 10.5 10/31  11/3- Hb 10.3- plts down somewhat to 165k- from 243k- will recheck before d/c  10.  AKI.  Resolved after IV hydration. 11.  Hyperlipidemia.  Crestor  20mg  daily 12.  COPD/remote tobacco use.  Monitor oxygen  saturations every shift 10/15- was on O2 this AM- per nursing from overnight only- but Sats 89-91%- getting CXR and ABG to see where he is- and getting sepsis work up.   10/16- started on Unasyn and will start Mucinex for coarse cough-   11/3- occ COPD cough, but sounds clear 13.  History of prostate cancer/urinary retention.  History of status post prostatectomy.  External catheter currently in place.  Continue Flomax  0.4 mg daily 10/14- is voiding- but had urinary retention last time- will check a few bladder scans.  10/15 - pt needing in/out caths- had >1400cc in bladder- has cathing protocol now 10/16-10/17 has orders to bladder scan and in/out if volumes >350cc- requiring caths sometimes  10/18 may need to cath more frequently if he's having volumes  >1000cc  10/19 cath volumes a little less yesterday--continue with current mgt  10/24  still having intermittent retention   -continue low dose urecholine at high volumes   -increase urecholine to 25mg  tid, continue flomax    -up to EOB/toilet to empty 10/29-11/2 no caths required- voiding well now 11/4- Will reduce Urecholine to 10 mg TID to try and wean off before d/c 11/5- also could be adding to Constipation- so will wean off tomorrow 14.  History of detached retina left eye.  Follow-up outpatient ophthalmology. 15. Severe constipation 10/14- LBM at least 4-5 days ago per documentation- pt doesn't remember when went last- will get KUB- and order Sorbitol  30cc- and SSE if no results- having abd pain.  10/16- Pt had 1 liquid stool- but otherwise, no other stools in 6-7 days- last KUB shows on 10/14 was full of stool- will give Sorbitol  since likely has hard stool that liquid getting by- and SSE- if that doesn't work, will ask nursing to do Mg citrate.   10/17- had massive BM  per pt- per chart 1 large, 1 medium-   10/18 smear yesterday. Continue senna-s and increase miralax  to bid. 11/5- LBM 4 days ago- has Sorbitol -  will increase Senna to 2 tabs at bedtime- duloxetine  is probably (and Lyrica ) can cause constipation- will d/w pt about stopping Duloxetine .  16.  Hypokalemia  10/14- K+ 3.2- will replete KCL 40 Meq x1. -- fine on 10/23 10/27- K+ 3.8 17. Low grade temp in setting of delirium  10/15- will get sepsis work up, ammonia level and ABG  10/17- has HCAP- Per #21 18. Cirrhosis on last Abd imaging  10/15- will get Ammonia as well esp with new jerking/tremors.  10/16- is actually shivering this AM, but isn't cold- Ammonia 16- not the cause of Sx's.   10/20- ALT is 47- slightly up but AST down to 37- can con't tylenol  as ordered  10/28- will check CMP next time 10/30- changed to tomorrow- hasn't been drawn yet this AM at of 11am  10/31- Labs not done yet from yesterday or today-  checking with nursing as to why-- LFTs normalized 19. Infrarenal aortic aneurysm with mural thrombus- 6.8 cm on last imaging   20. Orthostatic hypotension 10/16- On Flomax , however No BP meds that I can reduce- will check with therapy- if dropping more, will add Midodrine- ordering ACE wrap on LLE  -addendum- adding Midodrine 5 mg TID starting at 630 am  10/19 bp's a little more robust over weekend thus far. No changes today  10/22-24 improved on midodrine 10/28- 10/30- BP doing better on Midodrine- still a little soft, but better. Con't to monitor 10/31- will increase midodrine to 10 mg TID_ and changed times to 630, 1030 and 230-  -11/1-2/25 BPs stable, occasionally soft, has fluids running @100ml /hr 11/3- OFF IVFs- will have nursing check BP in B/L UEs and see if different- if so , will call Vascular 11/4- spoke with nursing about getting BP's in different arms 11/5- BP is 30 points difference in R and L arms- R is 30 points lower.  Vitals:   10/31/24 0433 10/31/24 1328 10/31/24 2004 11/01/24 0335  BP: 104/75 134/81 120/67 (!) 134/94   11/01/24 1300 11/01/24 1949 11/02/24 0519 11/02/24 1127  BP: 98/67 93/62 109/84 106/78   11/02/24 1128 11/02/24 1324 11/02/24 1958 11/03/24 0430  BP: 131/71 106/81 105/79 115/72    21. HCAP 10/16- on Unasyn-  will con't for 5-6 days- monitor WBC- down to 12.7 from 13.2 last evening. Shivering could be due to breaking a temp>>?? 10/17- no tremors/shivering- will recheck  CBC in AM- and make sure on right ABX-  and CMP due to LFTS/renal check on IV ABX- Added Albuterol  scheduled QID due to wheezing and bad COPD   10/20- WBC down to 8.2-   Unasyn completed  22. Urinary dropping urinal  11/3- Asked nursing to get male urinal to help    I spent a total of  52  minutes on total care today- >50% coordination of care- due to  Have kept trying to get ahold of Vascular due to BP differences in each arm- R arm 30 points lower than L arm.  Also reviewed  Vitals, B/B, and d/w nursing about Sorbitol ; and constipation; and d/w pt about reducing Urecholine/Flomax .    LOS: 23 days A FACE TO FACE EVALUATION WAS PERFORMED  Neylan Koroma 11/03/2024, 8:50 AM

## 2024-11-03 NOTE — Progress Notes (Signed)
 Educated patient and wife on wound care. Wife was able to return demonstration and perform wound care on patient. Patient and wife have no further questions at this time.

## 2024-11-03 NOTE — Plan of Care (Signed)
  Problem: SCI BOWEL ELIMINATION Goal: RH STG MANAGE BOWEL WITH ASSISTANCE Description: STG Manage Bowel with supervision Assistance. Outcome: Progressing   Problem: SCI BLADDER ELIMINATION Goal: RH STG MANAGE BLADDER WITH ASSISTANCE Description: STG Manage Bladder With supervision Assistance Outcome: Progressing   Problem: RH SKIN INTEGRITY Goal: RH STG SKIN FREE OF INFECTION/BREAKDOWN Description: Manage skin free of infection with supervision assistance Outcome: Progressing   Problem: RH SAFETY Goal: RH STG ADHERE TO SAFETY PRECAUTIONS W/ASSISTANCE/DEVICE Description: STG Adhere to Safety Precautions With Assistance/Device. Outcome: Progressing   Problem: RH PAIN MANAGEMENT Goal: RH STG PAIN MANAGED AT OR BELOW PT'S PAIN GOAL Description: < 4 w/ prns Outcome: Progressing

## 2024-11-04 ENCOUNTER — Other Ambulatory Visit (HOSPITAL_COMMUNITY): Payer: Self-pay

## 2024-11-04 LAB — COMPREHENSIVE METABOLIC PANEL WITH GFR
ALT: 28 U/L (ref 0–44)
AST: 22 U/L (ref 15–41)
Albumin: 3.1 g/dL — ABNORMAL LOW (ref 3.5–5.0)
Alkaline Phosphatase: 61 U/L (ref 38–126)
Anion gap: 11 (ref 5–15)
BUN: 20 mg/dL (ref 8–23)
CO2: 23 mmol/L (ref 22–32)
Calcium: 9 mg/dL (ref 8.9–10.3)
Chloride: 105 mmol/L (ref 98–111)
Creatinine, Ser: 1.03 mg/dL (ref 0.61–1.24)
GFR, Estimated: >60 mL/min (ref >60)
Glucose, Bld: 103 mg/dL — ABNORMAL HIGH (ref 70–99)
Potassium: 3.8 mmol/L (ref 3.5–5.1)
Sodium: 139 mmol/L (ref 135–145)
Total Bilirubin: 0.7 mg/dL (ref 0.0–1.2)
Total Protein: 6.7 g/dL (ref 6.5–8.1)

## 2024-11-04 LAB — CBC WITH DIFFERENTIAL/PLATELET
Abs Immature Granulocytes: 0.04 K/uL (ref 0.00–0.07)
Basophils Absolute: 0 K/uL (ref 0.0–0.1)
Basophils Relative: 1 %
Eosinophils Absolute: 0.3 K/uL (ref 0.0–0.5)
Eosinophils Relative: 6 %
HCT: 33.8 % — ABNORMAL LOW (ref 39.0–52.0)
Hemoglobin: 10.7 g/dL — ABNORMAL LOW (ref 13.0–17.0)
Immature Granulocytes: 1 %
Lymphocytes Relative: 21 %
Lymphs Abs: 1.3 K/uL (ref 0.7–4.0)
MCH: 30 pg (ref 26.0–34.0)
MCHC: 31.7 g/dL (ref 30.0–36.0)
MCV: 94.7 fL (ref 80.0–100.0)
Monocytes Absolute: 0.6 K/uL (ref 0.1–1.0)
Monocytes Relative: 10 %
Neutro Abs: 3.7 K/uL (ref 1.7–7.7)
Neutrophils Relative %: 61 %
Platelets: 170 K/uL (ref 150–400)
RBC: 3.57 MIL/uL — ABNORMAL LOW (ref 4.22–5.81)
RDW: 17.8 % — ABNORMAL HIGH (ref 11.5–15.5)
WBC: 6 K/uL (ref 4.0–10.5)
nRBC: 0 % (ref 0.0–0.2)

## 2024-11-04 MED ORDER — THIAMINE HCL 100 MG PO TABS
100.0000 mg | ORAL_TABLET | Freq: Every day | ORAL | 0 refills | Status: DC
  Filled 2024-11-04: qty 30, 30d supply, fill #0

## 2024-11-04 MED ORDER — MIDODRINE HCL 10 MG PO TABS
10.0000 mg | ORAL_TABLET | Freq: Three times a day (TID) | ORAL | 0 refills | Status: AC
  Filled 2024-11-04: qty 90, 30d supply, fill #0

## 2024-11-04 MED ORDER — DULOXETINE HCL 30 MG PO CPEP
30.0000 mg | ORAL_CAPSULE | Freq: Every day | ORAL | 0 refills | Status: AC
  Filled 2024-11-04: qty 30, 30d supply, fill #0

## 2024-11-04 MED ORDER — VITAMIN D 25 MCG (1000 UNIT) PO TABS
1000.0000 [IU] | ORAL_TABLET | Freq: Every day | ORAL | 0 refills | Status: AC
  Filled 2024-11-04: qty 30, 30d supply, fill #0

## 2024-11-04 MED ORDER — BUDESON-GLYCOPYRROL-FORMOTEROL 160-9-4.8 MCG/ACT IN AERO
2.0000 | INHALATION_SPRAY | Freq: Two times a day (BID) | RESPIRATORY_TRACT | 0 refills | Status: DC
  Filled 2024-11-04: qty 10.7, 30d supply, fill #0

## 2024-11-04 MED ORDER — GERHARDT'S BUTT CREAM: 1.0000 | TOPICAL_CREAM | Freq: Two times a day (BID) | CUTANEOUS | Status: DC

## 2024-11-04 MED ORDER — CITALOPRAM HYDROBROMIDE 20 MG PO TABS
20.0000 mg | ORAL_TABLET | Freq: Every day | ORAL | 0 refills | Status: DC
  Filled 2024-11-04: qty 30, 30d supply, fill #0

## 2024-11-04 MED ORDER — BETHANECHOL CHLORIDE 10 MG PO TABS
10.0000 mg | ORAL_TABLET | Freq: Three times a day (TID) | ORAL | 0 refills | Status: DC
  Filled 2024-11-04: qty 42, 14d supply, fill #0

## 2024-11-04 MED ORDER — BETHANECHOL CHLORIDE 10 MG PO TABS
10.0000 mg | ORAL_TABLET | Freq: Three times a day (TID) | ORAL | 0 refills | Status: DC
  Filled 2024-11-04: qty 90, 30d supply, fill #0

## 2024-11-04 MED ORDER — SENNOSIDES-DOCUSATE SODIUM 8.6-50 MG PO TABS: 2.0000 | ORAL_TABLET | Freq: Every day | ORAL | Status: DC

## 2024-11-04 MED ORDER — TAMSULOSIN HCL 0.4 MG PO CAPS
0.4000 mg | ORAL_CAPSULE | Freq: Every day | ORAL | 0 refills | Status: DC
  Filled 2024-11-04: qty 30, 30d supply, fill #0

## 2024-11-04 MED ORDER — APIXABAN 5 MG PO TABS
5.0000 mg | ORAL_TABLET | Freq: Two times a day (BID) | ORAL | 0 refills | Status: DC
  Filled 2024-11-04: qty 60, 30d supply, fill #0

## 2024-11-04 MED ORDER — PREGABALIN 150 MG PO CAPS
150.0000 mg | ORAL_CAPSULE | Freq: Three times a day (TID) | ORAL | 0 refills | Status: DC
  Filled 2024-11-04: qty 90, 30d supply, fill #0

## 2024-11-04 MED ORDER — ROSUVASTATIN CALCIUM 20 MG PO TABS
20.0000 mg | ORAL_TABLET | Freq: Every day | ORAL | 0 refills | Status: AC
  Filled 2024-11-04: qty 30, 30d supply, fill #0

## 2024-11-04 MED ORDER — ZOLPIDEM TARTRATE 5 MG PO TABS
5.0000 mg | ORAL_TABLET | Freq: Every evening | ORAL | 0 refills | Status: DC | PRN
  Filled 2024-11-04: qty 30, 30d supply, fill #0

## 2024-11-04 NOTE — Progress Notes (Signed)
 Inpatient Rehabilitation Discharge Medication Review by a Pharmacist  A complete drug regimen review was completed for this patient to identify any potential clinically significant medication issues.   High Risk Drug Classes Is patient taking? Indication by Medication  Antipsychotic No    Anticoagulant Yes Apixaban - DVT  Antibiotic No   Opioid No   Antiplatelet Yes ASA - PAD  Hypoglycemics/insulin  No    Vasoactive Medication Yes Midodrine - hypotension Flomax  -urinary retention  Chemotherapy No    Other Yes Acetaminophen  - pain management Vitamin C , MVI, Vitamin D, thiamine - supplement Celexa /cymbalta  - depression Breztri  - COPD Lyrica  - neuropathy Ambien  - sleep Crestor  - HLD Miralax , senokot-S - bowel regimen/constipation        Type of Medication Issue Identified Description of Issue Recommendation(s)  Drug Interaction(s) (clinically significant)        Duplicate Therapy        Allergy         No Medication Administration End Date        Incorrect Dose        Additional Drug Therapy Needed        Significant med changes from prior encounter (inform family/care partners about these prior to discharge).      Other          Clinically significant medication issues were identified that warrant physician communication and completion of prescribed/recommended actions by midnight of the next day:  No   Name of provider notified for urgent issues identified:    Provider Method of Notification:       Pharmacist comments:    Time spent performing this drug regimen review (minutes):  30   Levorn Gaskins, [place] Clinical Pharmacist 11/04/2024 3:05 PM

## 2024-11-04 NOTE — Progress Notes (Signed)
 Patient ID: David King, male   DOB: August 04, 1956, 68 y.o.   MRN: [id number]  PT recommends an 18x18 w/c.   SW called pt wife to inform on above item, and SW will order.   SW ordered item with [employer] via parachute.   Graeme Jude, MSW, LCSW Office: [phone] Cell: [phone] Fax: [phone]

## 2024-11-04 NOTE — Progress Notes (Signed)
 Physical Therapy Discharge Summary  Patient Details  Name: David King MRN: [id number] Date of Birth: 10/03/1956  Date of Discharge from PT service:November 04, 2024  Today's Date: 11/04/2024 PT Individual Time: 0900-0945, 1300-1400 PT Individual Time Calculation (min): 45 min, 60 min    Patient has met 7 of 7 long term goals due to improved activity tolerance, improved balance, improved postural control, increased strength, and ability to compensate for deficits.  Patient to discharge at an ambulatory level Supervision.   Patient's care partner is independent to provide the necessary physical assistance at discharge. Pt to d/c home with his wife who has undergone family education and is able to provide the necessary assist.   Reasons goals not met: NA  Recommendation:  Patient will benefit from ongoing skilled PT services in home health setting to continue to advance safe functional mobility, address ongoing impairments in strength, endurance, balance, and minimize fall risk.  Equipment: 18x18 w/c  Reasons for discharge: treatment goals met and discharge from hospital  Patient/family agrees with progress made and goals achieved: Yes  Skilled Therapeutic Interventions/Progress Updates:  Session 1: Pt seated in w/c on arrival and agreeable to therapy. Discussed recent change in R foot pain, unable to find source from brace, therapist and pt agree that this will likely be remedied when pt gets bigger shoes. Pt states plan to go to shoe store in next few days. Discussed equipment recommendations, including MWC and RW. Pt agreeable. Rest of session spent performing endurance activity including stairs 6 and 3 and side stepping. Pt required seated rest break after performing 1/2 lap, but was able to perform out and back x 2 with seated rest breaks. Discussed energy conservation strategies as appropriate. Pt remained in w/c, was left with all needs in reach and alarm active.   Session 2: Pt  seated in w/c on arrival and agreeable to therapy. Pt reports continued foot pain, requested tylenol  from nsg during session. Pt propelled w/c with BUE 1-200 ft bouts mod I, transported for time management at times. Pt taken to [facility] entrance for grass navigation practice. Pt navigated x 20 ft on grass with CGA. Although more fatigued, pt was able to complete without difficulty. Returned to unit, pt participated in discharge assessments as documented below. Pt also performed several bouts of gait with supervision, with small near LOB, but pt was able to self correct. Pt returned to room and to bed with supervision, was left with all needs in reach and alarm active.   PT Discharge Precautions/Restrictions Precautions Precautions: Fall;Back Precaution/Restrictions Comments: watch BP (orthostatic); RLE foot drop, wound VAC Required Braces or Orthoses: Spinal Brace Spinal Brace: Applied in sitting position;Lumbar corset Restrictions Weight Bearing Restrictions Per Provider Order: No Vital Signs Therapy Vitals Temp: 98.4 F (36.9 C) Pulse Rate: 98 Resp: 17 BP: 99/80 Patient Position (if appropriate): Lying Oxygen  Therapy SpO2: 97 % O2 Device: Room Air Pain Pain Assessment Pain Scale: 0-10 Pain Score: 8  Pain Location: Leg Pain Intervention(s): Medication (See eMAR) Pain Interference Pain Interference Pain Effect on Sleep: 1. Rarely or not at all Pain Interference with Therapy Activities: 2. Occasionally Pain Interference with Day-to-Day Activities: 1. Rarely or not at all Vision/Perception  Vision - History Ability to See in Adequate Light: 0 Adequate Perception Perception: Within Functional Limits Praxis Praxis: WFL  Cognition Overall Cognitive Status: Within Functional Limits for tasks assessed Arousal/Alertness: Awake/alert Orientation Level: Oriented X4 Memory: Appears intact Sensation Sensation Light Touch: Impaired by gross assessment Hot/Cold: Appears  Intact Proprioception: Impaired by gross assessment Additional Comments: absent sensation below fasciotomy on RLE, LLE intact Coordination Gross Motor Movements are Fluid and Coordinated: No Fine Motor Movements are Fluid and Coordinated: No Coordination and Movement Description: increased BUE/hand tremor Motor  Motor Motor: Abnormal tone Motor - Discharge Observations: drop foot  Mobility Bed Mobility Bed Mobility: Rolling Left;Rolling Right;Supine to Sit Rolling Right: Independent with assistive device Rolling Left: Independent with assistive device Supine to Sit: Independent with assistive device Transfers Transfers: Sit to Stand;Stand Pivot Transfers;Stand to Sit Sit to Stand: Independent with assistive device Stand to Sit: Independent with assistive device Stand Pivot Transfers: Independent with assistive device Transfer (Assistive device): Rolling walker Locomotion  Gait Ambulation: Yes Gait Assistance: Supervision/Verbal cueing Gait Distance (Feet): 145 Feet Assistive device: Rolling walker Gait Assistance Details: Tactile cues for posture;Verbal cues for sequencing;Verbal cues for gait pattern;Verbal cues for safe use of DME/AE;Visual cues/gestures for precautions/safety;Visual cues/gestures for sequencing;Verbal cues for precautions/safety;Verbal cues for technique Gait Gait: Yes Gait Pattern: Impaired Gait Pattern: Step-to pattern;Decreased stride length;Decreased dorsiflexion - right;Right hip hike Gait velocity: decreased Stairs / Additional Locomotion Stairs: Yes Stairs Assistance: Contact Guard/Touching assist Stair Management Technique: Two rails Ramp: Contact Guard/touching assist Curb: Contact Guard/Touching assist Pick up small object from the floor assist level: Supervision/Verbal cueing Pick up small object from the floor assistive device: Chief Operating Officer Mobility: Yes Wheelchair Assistance: Independent with Information Systems Manager: Both upper extremities Wheelchair Parts Management: Independent Distance: 200  Trunk/Postural Assessment  Cervical Assessment Cervical Assessment: Within Functional Limits Thoracic Assessment Thoracic Assessment: Within Functional Limits Lumbar Assessment Lumbar Assessment: Exceptions to [facility] Postural Control Postural Control: Deficits on evaluation  Balance Balance Balance Assessed: Yes Static Sitting Balance Static Sitting - Balance Support: Feet supported Static Sitting - Level of Assistance: 7: Independent Dynamic Sitting Balance Dynamic Sitting - Balance Support: During functional activity Dynamic Sitting - Level of Assistance: 6: Modified independent (Device/Increase time) Dynamic Sitting - Balance Activities: Lateral lean/weight shifting Static Standing Balance Static Standing - Balance Support: Bilateral upper extremity supported Static Standing - Level of Assistance: 4: Min assist Extremity Assessment      RLE Assessment RLE Assessment: Exceptions to [facility] RLE Strength Right Hip Flexion: 4/5 Right Hip Extension: 4/5 Right Hip ABduction: 4+/5 Right Hip ADduction: 4+/5 Right Knee Flexion: 4/5 Right Knee Extension: 4/5 Right Ankle Dorsiflexion: 0/5 Right Ankle Plantar Flexion: 3/5 LLE Assessment LLE Assessment: Exceptions to [facility] General Strength Comments: grossly 4+/5   Schuyler BROCKS Makia Bossi 11/04/2024, 4:15 PM

## 2024-11-04 NOTE — Progress Notes (Signed)
 Asleep. Passed along by [employer] Nurse patient requesting dressing be changed. Miscommunication from earlier in day that patient significant other would be coming later this evening to complete dressing change. Patient not woken up at this time. Report off to next shift dressing change for right leg is due.

## 2024-11-04 NOTE — Progress Notes (Signed)
 Occupational Therapy Discharge Summary  Patient Details  Name: David King MRN: [id number] Date of Birth: [birth year]  Date of Discharge from OT service:November 04, 2024   Patient has met 10 of 10 long term goals due to {due un:6958348}.  Pt made steady progress with BADLs and functional transfers during this admission. Bathing with supervisoin. LB dressing with supervision. Mod I for toilet transfers and CGA for toileting. Pt's wife participated in education session. Pt pleased with progress and ready for discharge. Patient to discharge at overall {LOA:3049010} level.  Patient's care partner {care partner:[id number]} to provide the necessary {assistance:3041652} assistance at discharge.    Reasons goals not met: n/a  Recommendation:  Patient will benefit from ongoing skilled OT services in {setting:3041680} to continue to advance functional skills in the area of {ADL/iADL:3041649}.  Equipment: No equipment provided  Reasons for discharge: {Reason for discharge:3049018}  Patient/family agrees with progress made and goals achieved: {Pt/Family agree with progress/goals:3049020}  OT Discharge ADL ADL Equipment Provided: Reacher, Sock aid, Long-handled shoe horn, Long-handled sponge Eating: Independent Where Assessed-Eating: Wheelchair Grooming: Minimal assistance Where Assessed-Grooming: Sitting at sink Upper Body Bathing: Independent Where Assessed-Upper Body Bathing: Sitting at sink Lower Body Bathing: Supervision/safety Where Assessed-Lower Body Bathing: Sitting at sink, Standing at sink Upper Body Dressing: Modified independent (Device) Where Assessed-Upper Body Dressing: Sitting at sink Lower Body Dressing: Supervision/safety Where Assessed-Lower Body Dressing: Standing at sink, Sitting at sink Toileting: Contact guard Where Assessed-Toileting: Toilet, Psychiatrist Transfer: Modified independent Statistician Method: Proofreader: Engineer, Agricultural: Unable to assess Film/video Editor: Close supervision Film/video Editor Method: Designer, Industrial/product: Shower seat with back ADL Comments: Patient with difficulty follow directions for ADL's with fulctuation of cognition. Vision Baseline Vision/History: 1 Wears glasses Patient Visual Report: Blurring of vision Vision Assessment?: No apparent visual deficits Perception    Praxis   Cognition Cognition Overall Cognitive Status: Within Functional Limits for tasks assessed Arousal/Alertness: Awake/alert Orientation Level: Person;Place;Situation Person: Oriented Place: Oriented Situation: Oriented Memory: Appears intact Attention: Sustained Sustained Attention: Appears intact Awareness: Appears intact Problem Solving: Appears intact Safety/Judgment: Appears intact Brief Interview for Mental Status (BIMS) Repetition of Three Words (First Attempt): 3 Temporal Orientation: Year: Correct Temporal Orientation: Month: Accurate within 5 days Temporal Orientation: Day: Correct Recall: Sock: No, could not recall Recall: Blue: Yes, no cue required Recall: Bed: Yes, no cue required BIMS Summary Score: 13 Sensation Sensation Light Touch: Impaired by gross assessment Hot/Cold: Appears Intact Proprioception: Impaired by gross assessment Stereognosis: Not tested Additional Comments: absent sensation below fasciotomy on RLE, LLE intact Coordination Gross Motor Movements are Fluid and Coordinated: No Fine Motor Movements are Fluid and Coordinated: No Coordination and Movement Description: increased BUE/hand tremor Motor  Motor Motor: Abnormal tone Motor - Skilled Clinical Observations: unable to DF right foot Mobility  Bed Mobility Bed Mobility: Rolling Left;Rolling Right Rolling Right: Supervision/verbal cueing Rolling Left: Supervision/Verbal cueing Supine to Sit: Supervision/Verbal cueing Transfers Sit to Stand:  Independent with assistive device Stand to Sit: Independent with assistive device  Trunk/Postural Assessment  Cervical Assessment Cervical Assessment: Within Functional Limits Thoracic Assessment Thoracic Assessment: Within Functional Limits Lumbar Assessment Lumbar Assessment: Exceptions to WFL (LSO) Postural Control Postural Control: Deficits on evaluation  Balance Static Sitting Balance Static Sitting - Balance Support: Feet supported Static Sitting - Level of Assistance: 7: Independent Dynamic Sitting Balance Dynamic Sitting - Balance Support: During functional activity Dynamic Sitting - Level of Assistance: 6: Modified independent (Device/Increase time) Extremity/Trunk Assessment  RUE Assessment RUE Assessment: Within Functional Limits General Strength Comments: 4/5 LUE Assessment LUE Assessment: Within Functional Limits General Strength Comments: 4/5   David King 11/04/2024, 8:21 AM

## 2024-11-04 NOTE — Progress Notes (Signed)
 PROGRESS NOTE   Subjective/Complaints:  Pt reports had a medium BM yesterday- doesn't want Sorbitol - would prefer to work on it- stopped Mac and cheese which he thinks was stopping him up- makes sense- was having multiple times per day.   We dsicussed options for nerve pain- decided to get rid of Duloxetine  and keep Lyrica .   ROS: as per HPI   Pt denies SOB, abd pain, CP, N/V/C/D, and vision changes    Low BP (+)- improving   Objective:   No results found.   Recent Labs    11/04/24 0500  WBC 6.0  HGB 10.7*  HCT 33.8*  PLT 170     Recent Labs    11/04/24 0500  NA 139  K 3.8  CL 105  CO2 23  GLUCOSE 103*  BUN 20  CREATININE 1.03  CALCIUM  9.0      Intake/Output Summary (Last 24 hours) at 11/04/2024 9147 Last data filed at 11/04/2024 9188 Gross per 24 hour  Intake 830 ml  Output --  Net 830 ml     Wound 10/11/24 1630 Pressure Injury Heel Right Unstageable - Full thickness tissue loss in which the base of the injury is covered by slough (yellow, tan, gray, green or brown) and/or eschar (tan, brown or black) in the wound bed. (Active)     Wound 10/22/24 0945 Pressure Injury Foot Right Stage 1 -  Intact skin with non-blanchable redness of a localized area usually over a bony prominence. (Active)    Physical Exam: Vital Signs Blood pressure 103/76, pulse 90, temperature 98.1 F (36.7 C), resp. rate 18, height 5' 9 (1.753 m), weight 86.2 kg, SpO2 97%.         General: awake, alert, appropriate, in w/c at sink doing grooming with OT; NAD HENT: conjugate gaze; oropharynx moist CV: regular rate; no JVD Pulmonary: CTA B/L; no W/R/R- good air movement GI: soft, NT, ND, (+)BS- more normoactive Psychiatric: appropriate- bright affect Neurological: Ox3   Ext: no clubbing, cyanosis, or edema although RLE ACE wrapped this AM- cannot assess on direct visual exam   PRIOR EXAMS: General: Skin is  warm. Wounds presenting still similar to below -vac removed from right lower leg -assessed wounds /incisions via pics- sutures stretches across- but great granulation tissue- very superficial, healing well.  Assessed pics of R heel and leg- as below         Neurological:     Comments: Patient is alert.     Oriented x 3 and follows commands. CN exam non-focal. Fair insight and awareness. MMT: BUE 5/5. LLE limited by  pain but appears to be moving all muscle groups. RLE limited by wounds/dressings, etc. Sensory exam normal for light touch and pain except for right foot where there is diminished LT along dorsum and to a lesser extent plantar aspect of foot.  No limb ataxia or cerebellar signs. No abnormal tone appreciated.     Musc: right heel cord tight- still about  minus 10-deg             Assessment/Plan: 1. Functional deficits which require 3+ hours per day of interdisciplinary therapy in a comprehensive inpatient rehab setting. Physiatrist  is providing close team supervision and 24 hour management of active medical problems listed below. Physiatrist and rehab team continue to assess barriers to discharge/monitor patient progress toward functional and medical goals  Care Tool:  Bathing    Body parts bathed by patient: Right arm, Left arm, Chest, Abdomen, Face, Front perineal area, Buttocks, Right upper leg, Left upper leg, Left lower leg   Body parts bathed by helper: Front perineal area, Buttocks, Right upper leg, Left lower leg, Right lower leg, Left upper leg Body parts n/a: Right lower leg   Bathing assist Assist Level: Supervision/Verbal cueing     Upper Body Dressing/Undressing Upper body dressing   What is the patient wearing?: Pull over shirt, Orthosis    Upper body assist Assist Level: Independent with assistive device    Lower Body Dressing/Undressing Lower body dressing      What is the patient wearing?: Pants, Underwear/pull up     Lower body assist  Assist for lower body dressing: Supervision/Verbal cueing     Toileting Toileting    Toileting assist Assist for toileting: Contact Guard/Touching assist     Transfers Chair/bed transfer  Transfers assist  Chair/bed transfer activity did not occur: Safety/medical concerns  Chair/bed transfer assist level: Supervision/Verbal cueing     Locomotion Ambulation   Ambulation assist   Ambulation activity did not occur: Safety/medical concerns  Assist level: 2 helpers Assistive device: Walker-rolling Max distance: 145'   Walk 10 feet activity   Assist  Walk 10 feet activity did not occur: Safety/medical concerns  Assist level: 2 helpers Assistive device: Walker-rolling   Walk 50 feet activity   Assist Walk 50 feet with 2 turns activity did not occur: Safety/medical concerns  Assist level: 2 helpers Assistive device: Walker-rolling    Walk 150 feet activity   Assist Walk 150 feet activity did not occur: Safety/medical concerns  Assist level: 2 helpers Assistive device: Walker-rolling    Walk 10 feet on uneven surface  activity   Assist Walk 10 feet on uneven surfaces activity did not occur: Safety/medical concerns         Wheelchair     Assist Is the patient using a wheelchair?: Yes Type of Wheelchair: Manual Wheelchair activity did not occur: Safety/medical concerns  Wheelchair assist level: Supervision/Verbal cueing Max wheelchair distance: >200'    Wheelchair 50 feet with 2 turns activity    Assist    Wheelchair 50 feet with 2 turns activity did not occur: Safety/medical concerns   Assist Level: Supervision/Verbal cueing   Wheelchair 150 feet activity     Assist  Wheelchair 150 feet activity did not occur: Safety/medical concerns   Assist Level: Supervision/Verbal cueing   Blood pressure 103/76, pulse 90, temperature 98.1 F (36.7 C), resp. rate 18, height 5' 9 (1.753 m), weight 86.2 kg, SpO2 97%.  Medical Problem List  and Plan: 1. Functional deficits secondary to PAD/compartment syndrome/right leg ischemia/status post thrombectomy.  Status post right leg anterior compartment fasciotomy 9/26 with multiple washouts as well as excisional debridement/wound VAC.  Patient is completing a course of doxycycline             -patient may not yet shower             -ELOS/Goals: 11/11,  supervision goals  -ordered PRAFO to help stretch right heel cord  D/c moved to 11/7 Con't CIR PT and OT Will call Vascular about BP difference in each arm- see if I'm calling the right service. Will also touch base  about sutures in R leg.   11/6- didn't hear back yesterday- paged again today 2.  Antithrombotics: -DVT/anticoagulation:  Pharmaceutical: now on eliquis 5mg  BID             -antiplatelet therapy: Aspirin  81 mg daily 3. Pain Management: Lyrica  150 mg 3 times daily,  tylenol  prn.    10/22 doing well with tylenol  alone. Off all narcotics (prefers this) 10/24 pt doesn't want to increase lyrica  any further at this point (not too much higher it can go) 10/27- pain tolerable- spikes ~ 12x/day but going well otherwise with tylenol  alone 11/3-11/4  pain mainly tolerable- doesn't want other meds 11/6- d/c Cymbalta - dont' need for nerve pain 4. Mood/Behavior/Sleep: Cymbalta  30 mg nightly, Celexa  20 mg daily             -antipsychotic agents: N/A  -ambien  PRN 5. Neuropsych/cognition: This patient is capable of making decisions on his own behalf. 6. Skin/Wound Care: Routine skin checks -vac removed-->Vashe soaked gauze over wound,dry gauze, kerlix, ACE --daily change -local care and pressure relief as needed to multiple other wounds. On doxycycline             -prevalon boot/pressure relief, right heel wound  10/28- Said needed vinegar for a wound on R heel- will d/w nursing  10/30- assessed RLE wounds- yesterday-  looking better and heel stable 10/31- went over pics- heel about the same- asked nursing to get vinegar soaks 11/3-  changed the wound care orders per nursing coordinator 11/5- Wound looks more beefy red for incision- heel looks about the same- a lot of eschar. Same with elbow- but top of foot looks great 7. Fluids/Electrolytes/Nutrition: Routine and analysis with follow-up chemistries, continue vitamins/supplements.   8. Lumbar radiculopathy/large disc herniation spinal stenosis.  Status post L4-5 transforaminal interbody fusion/posterior lateral fusion insertion interbody device L4-5 decompression 09/08/2024 per Dr. Beuford.  Back brace when out of bed  -10/24--Dr. Letitia revised TLSO to LSO which he needs to wear when standing or ambulating   -back precautions ongoing  11/4- working on not breaking back precautions and wiping to have BM- ongoing therapy for this  11/6- doing better- going home tomorrow 9.  Acute blood loss anemia.  Follow-up CBC 10/14- Pt has ~ 150-200cc out of VAC- not sure since when? Hb down 1/2 unit from 8.9 to 8.4- will recheck Thursday  10/15- Hb will check in AM  10/17- Hb 9.0  10/27- Hb 9.9- VAC off--> up to 10.5 10/31  11/3- Hb 10.3- plts down somewhat to 165k- from 243k- will recheck before d/c   11/6- Hb 10/7 and platelets 170k 10.  AKI.  Resolved after IV hydration. 11.  Hyperlipidemia.  Crestor  20mg  daily 12.  COPD/remote tobacco use.  Monitor oxygen  saturations every shift 10/15- was on O2 this AM- per nursing from overnight only- but Sats 89-91%- getting CXR and ABG to see where he is- and getting sepsis work up.   10/16- started on Unasyn and will start Mucinex for coarse cough-   11/3- occ COPD cough, but sounds clear 13.  History of prostate cancer/urinary retention.  History of status post prostatectomy.  External catheter currently in place.  Continue Flomax  0.4 mg daily 10/14- is voiding- but had urinary retention last time- will check a few bladder scans.  10/15 - pt needing in/out caths- had >1400cc in bladder- has cathing protocol now 10/16-10/17 has orders to  bladder scan and in/out if volumes >350cc- requiring caths sometimes  10/18 may need to  cath more frequently if he's having volumes >1000cc  10/19 cath volumes a little less yesterday--continue with current mgt  10/24 still having intermittent retention   -continue low dose urecholine at high volumes   -increase urecholine to 25mg  tid, continue flomax    -up to EOB/toilet to empty 10/29-11/2 no caths required- voiding well now 11/4- Will reduce Urecholine to 10 mg TID to try and wean off before d/c 11/5- also could be adding to Constipation- so will wean off tomorrow 11/6- stop Urecholine 14.  History of detached retina left eye.  Follow-up outpatient ophthalmology. 15. Severe constipation 10/14- LBM at least 4-5 days ago per documentation- pt doesn't remember when went last- will get KUB- and order Sorbitol  30cc- and SSE if no results- having abd pain.  10/16- Pt had 1 liquid stool- but otherwise, no other stools in 6-7 days- last KUB shows on 10/14 was full of stool- will give Sorbitol  since likely has hard stool that liquid getting by- and SSE- if that doesn't work, will ask nursing to do Mg citrate.   10/17- had massive BM  per pt- per chart 1 large, 1 medium-   10/18 smear yesterday. Continue senna-s and increase miralax  to bid. 11/5- LBM 4 days ago- has Sorbitol -  will increase Senna to 2 tabs at bedtime- duloxetine  is probably (and Lyrica ) can cause constipation- will d/w pt about stopping Duloxetine .  11/6- stopped Duloxetine - had medium BM 16.  Hypokalemia  10/14- K+ 3.2- will replete KCL 40 Meq x1. -- fine on 10/23 10/27- K+ 3.8 17. Low grade temp in setting of delirium  10/15- will get sepsis work up, ammonia level and ABG  10/17- has HCAP- Per #21 18. Cirrhosis on last Abd imaging  10/15- will get Ammonia as well esp with new jerking/tremors.  10/16- is actually shivering this AM, but isn't cold- Ammonia 16- not the cause of Sx's.   10/20- ALT is 47- slightly up but AST down to  37- can con't tylenol  as ordered  10/28- will check CMP next time 10/30- changed to tomorrow- hasn't been drawn yet this AM at of 11am  10/31- Labs not done yet from yesterday or today- checking with nursing as to why-- LFTs normalized 19. Infrarenal aortic aneurysm with mural thrombus- 6.8 cm on last imaging  Needs f/u.  20. Orthostatic hypotension 10/16- On Flomax , however No BP meds that I can reduce- will check with therapy- if dropping more, will add Midodrine- ordering ACE wrap on LLE  -addendum- adding Midodrine 5 mg TID starting at 630 am  10/19 bp's a little more robust over weekend thus far. No changes today  10/22-24 improved on midodrine 10/28- 10/30- BP doing better on Midodrine- still a little soft, but better. Con't to monitor 10/31- will increase midodrine to 10 mg TID_ and changed times to 630, 1030 and 230-  -11/1-2/25 BPs stable, occasionally soft, has fluids running @100ml /hr 11/3- OFF IVFs- will have nursing check BP in B/L UEs and see if different- if so , will call Vascular 11/4- spoke with nursing about getting BP's in different arms 11/5- BP is 30 points difference in R and L arms- R is 30 points lower.  11/6- still soft, will need f/u with me due to being on Midodrine and foot drop on RLE Vitals:   11/01/24 0335 11/01/24 1300 11/01/24 1949 11/02/24 0519  BP: (!) 134/94 98/67 93/62  109/84   11/02/24 1127 11/02/24 1128 11/02/24 1324 11/02/24 1958  BP: 106/78 131/71 106/81 105/79  11/03/24 0430 11/03/24 1306 11/03/24 1954 11/04/24 0422  BP: 115/72 118/72 (!) 101/57 103/76    21. HCAP 10/16- on Unasyn-  will con't for 5-6 days- monitor WBC- down to 12.7 from 13.2 last evening. Shivering could be due to breaking a temp>>?? 10/17- no tremors/shivering- will recheck CBC in AM- and make sure on right ABX-  and CMP due to LFTS/renal check on IV ABX- Added Albuterol  scheduled QID due to wheezing and bad COPD   10/20- WBC down to 8.2-   Unasyn completed  22. Urinary  dropping urinal  11/3- Asked nursing to get male urinal to help    I spent a total of 45    minutes on total care today- >50% coordination of care- due to  D/w Vascular, OT;  and d/w about Urecholine- stopped- Duloxeitne- stopped- and d/w PA about med changes- will need f/u with me after d/c and needs f/u on Infrarenal aortic aneurysm  LOS: 24 days A FACE TO FACE EVALUATION WAS PERFORMED  Laekyn Rayos 11/04/2024, 8:52 AM

## 2024-11-04 NOTE — Progress Notes (Signed)
 Occupational Therapy Session Note  Patient Details  Name: David King MRN: [id number] Date of Birth: [birth year]  Today's Date: 11/04/2024 OT Individual Time: 1035-1100 OT Individual Time Calculation (min): 25 min    Short Term Goals: Week 4:  OT Short Term Goal 1 (Week 4): STG=LTG 2/2 ELOS (LTG upgraded)  Skilled Therapeutic Interventions/Progress Updates:    Pt seated in w/c upon arrival. Skilled OT intervention with focus on review of home safety recommendations, energy conservation strategies, importance of setting schedule, night time bathroom recommendations, and discharge planning to prepare for discharge home tomorrow. Pt pleased with progress and ready to go home.   Therapy Documentation Precautions:  Precautions Precautions: Fall, Back Precaution Booklet Issued: Yes (comment) Recall of Precautions/Restrictions: Impaired Precaution/Restrictions Comments: watch BP (orthostatic); RLE foot drop, wound VAC Required Braces or Orthoses: Spinal Brace Spinal Brace: Thoracolumbosacral orthotic, Applied in sitting position Restrictions Weight Bearing Restrictions Per Provider Order: No RLE Weight Bearing Per Provider Order: Weight bearing as tolerated Other Position/Activity Restrictions: transfers only on RLE   Pain:  Pt c/o Rt heel pain; shoe and AFO removed with relief noted   Therapy/Group: Individual Therapy  [name] 11/04/2024, 12:17 PM

## 2024-11-04 NOTE — Progress Notes (Signed)
 Occupational Therapy Session Note  Patient Details  Name: David King MRN: [id number] Date of Birth: Apr 20, 1956  Today's Date: 11/04/2024 OT Individual Time: 9299-9187 OT Individual Time Calculation (min): 72 min    Short Term Goals: Week 4:  OT Short Term Goal 1 (Week 4): STG=LTG 2/2 ELOS (LTG upgraded)  Skilled Therapeutic Interventions/Progress Updates:    Skilled OT intervention with focus on LB dressing, BLE therex, sit<>stand and standing balance, w/c mobility, and ongoing discharge planning to increase pt's independence with BADLs. Pt able to don shoe and AFO without supervision. Pt completed grooming seated in w/c at sink. NuStep 10 mins rolling hills level 5-9. Functional amb in hallway 168' without rest at supervision level. Sit<>stand with supervision. Pt amb with RW back to room and returned to w/c. All needs within reach.   Therapy Documentation Precautions:  Precautions Precautions: Fall, Back Precaution Booklet Issued: Yes (comment) Recall of Precautions/Restrictions: Impaired Precaution/Restrictions Comments: watch BP (orthostatic); RLE foot drop, wound VAC Required Braces or Orthoses: Spinal Brace Spinal Brace: Thoracolumbosacral orthotic, Applied in sitting position Restrictions Weight Bearing Restrictions Per Provider Order: No RLE Weight Bearing Per Provider Order: Weight bearing as tolerated Other Position/Activity Restrictions: transfers only on RLE Pain: Pt c/o Rt foot pain (7/10); pain meds admin prior to therapy   Therapy/Group: Individual Therapy  [name] 11/04/2024, 8:14 AM

## 2024-11-05 ENCOUNTER — Other Ambulatory Visit (HOSPITAL_COMMUNITY): Payer: Self-pay

## 2024-11-05 NOTE — Progress Notes (Signed)
 Inpatient Rehabilitation Care Coordinator Discharge Note   Patient Details  Name: David King MRN: [id number] Date of Birth: 01-11-1956   Discharge location: D/c to home  Length of Stay: 24 days  Discharge activity level: Supervision  Home/community participation: LImited  Patient response un:Yzjouy Literacy - How often do you need to have someone help you when you read instructions, pamphlets, or other written material from your doctor or pharmacy?: Rarely  Patient response un:Dnrpjo Isolation - How often do you feel lonely or isolated from those around you?: Rarely  Services provided included: MD, RD, PT, OT, RN, CM, TR, Pharmacy, Neuropsych, SW  Financial Services:  Field Seismologist Utilized: [employer]  Choices offered to/list presented to: patient and wife  Follow-up services arranged:  Patient/Family has no preference for HH/DME agencies, DME, Home Health Home Health Agency: [facility] for HHPT/OT/SN (wound care)    DME : [employer] [facility] for w/c    Patient response to transportation need: Is the patient able to respond to transportation needs?: Yes In the past 12 months, has lack of transportation kept you from medical appointments or from getting medications?: No In the past 12 months, has lack of transportation kept you from meetings, work, or from getting things needed for daily living?: No   Patient/Family verbalized understanding of follow-up arrangements:  Yes  Individual responsible for coordination of the follow-up plan:    Confirmed correct DME delivered: Graeme DELENA Jude 11/05/2024    Comments (or additional information):fam edu completed  Summary of Stay    Date/Time Discharge Planning CSW  11/02/24 1504 Home with wife who can assist but does have knee issues. Wife is here daily to provide support. Pt now has WTD dx; [facility]HA- [facility] for HHPT/OT/SN/iade. Fam edu on Wed 10am-12pm with PT/OT and 1-2pm with RN. SW will confirm  there are no barriers to discharge. AAC  10/25/24 1221 Home with wife who can assist but does have knee issues. Wife is here daily to provide support. Pt now has WTD dx; [facility]- [facility] for HHPT/OT/SN/iade. SW will confirm there are no barriers to discharge. AAC  10/18/24 1319 Home with wife who can assist but does have knee issues. Wife is here daily to provide support. Pt will go home with wound vac. SW will confirm there are no barriers to discharge. AAC  10/12/24 1009 TBA AAC       Eleena Grater A Jude

## 2024-11-05 NOTE — Progress Notes (Signed)
 Patient once again evaluated at his bedside today.  He is in great spirits.  He states that he did deal with some constipation, but that he has figure that out, and that has improved.  He has been using his AFO brace, which she does state has been very helpful with his ambulation.  His pain is well-controlled.  His surgical incisions are well-healed. He is looking forward to going home today.  His preoperative left leg pain continues to be resolved.  He does have additional follow-up planned with Dr. Harden,  with regards to his right leg.  I did communicate with Dr. Harden recently with regards to his right leg, and it does appear that he does have a plan moving forward. From the standpoint of his surgery, I do wish to reevaluate him in 1 month in the office. My office will reach out to him to get this arranged.  He was instructed to utilize his LSO brace when he is out of the house.  It was a pleasure seeing how well he was doing today.  I do look forward to reevaluating him in 1 month.

## 2024-11-05 NOTE — Progress Notes (Signed)
  Progress Note    11/05/2024 8:00 AM * No surgery found *  Subjective:  no complaints.  Excited to go home today   Vitals:   11/04/24 1935 11/05/24 0438  BP: 101/65 107/74  Pulse: 83 94  Resp: 18 18  Temp: 98.2 F (36.8 C) 98.3 F (36.8 C)  SpO2: 98% 99%   Physical Exam: Lungs:  non labored Incisions:  groin incisions and R chest incision healed Extremities:  feet are warm and well perfused; dressing left in place R leg Neurologic: A&O  CBC    Component Value Date/Time   WBC 6.0 11/04/2024 0500   RBC 3.57 (L) 11/04/2024 0500   HGB 10.7 (L) 11/04/2024 0500   HCT 33.8 (L) 11/04/2024 0500   PLT 170 11/04/2024 0500   MCV 94.7 11/04/2024 0500   MCH 30.0 11/04/2024 0500   MCHC 31.7 11/04/2024 0500   RDW 17.8 (H) 11/04/2024 0500   LYMPHSABS 1.3 11/04/2024 0500   MONOABS 0.6 11/04/2024 0500   EOSABS 0.3 11/04/2024 0500   BASOSABS 0.0 11/04/2024 0500    BMET    Component Value Date/Time   NA 139 11/04/2024 0500   K 3.8 11/04/2024 0500   CL 105 11/04/2024 0500   CO2 23 11/04/2024 0500   GLUCOSE 103 (H) 11/04/2024 0500   BUN 20 11/04/2024 0500   CREATININE 1.03 11/04/2024 0500   CREATININE 1.06 10/10/2020 0915   CALCIUM  9.0 11/04/2024 0500   GFRNONAA >60 11/04/2024 0500   GFRNONAA 73 10/10/2020 0915   GFRAA 84 10/10/2020 0915    INR    Component Value Date/Time   INR 1.2 08/22/2022 0833     Intake/Output Summary (Last 24 hours) at 11/05/2024 0800 Last data filed at 11/04/2024 2015 Gross per 24 hour  Intake 828 ml  Output --  Net 828 ml     Assessment/Plan:  68 y.o. male is s/p R ax bifemoral bypass * No surgery found *   BLE warm and well perfused on exam.  He has healed his R chest and both groin incisions.  Picture of fasciotomy incision shows granulation tissue.  Continue wet to dry dressing changes with Vashe daily.  R heel wound demarcating well; stressed the importance of avoiding all pressure to the heel.  He underwent excision of all musle in  the anterior compartment and has a foot drop.  He is aware he is still at risk for amputation if unable to heal the R heal wound and fasciotomy.  He will follow up in our office in 3-4 weeks; office will call to arrange.  He will also need to follow up with Dr. Harden for ongoing wound care.    Donnice Sender, PA-C [facility] [phone] 11/05/2024 8:00 AM

## 2024-11-05 NOTE — Progress Notes (Signed)
 PROGRESS NOTE   Subjective/Complaints:  Pt reports feeling good- ready for d/c. Needs midline out before leaves.  We discussed with PA- getting PA to go over meds and f/u's- will need f/u with me and Vascular as well as Ortho spine.  Also discussed laying down BP is slightly higher, sometimes, but when stands up, it drops, so still needs midodrine- d/w pt how to take 4 hours apart,   ROS: as per HPI   Pt denies SOB, abd pain, CP, N/V/C/D, and vision changes    Low BP (+)- improving   Objective:   No results found.   Recent Labs    11/04/24 0500  WBC 6.0  HGB 10.7*  HCT 33.8*  PLT 170     Recent Labs    11/04/24 0500  NA 139  K 3.8  CL 105  CO2 23  GLUCOSE 103*  BUN 20  CREATININE 1.03  CALCIUM  9.0      Intake/Output Summary (Last 24 hours) at 11/05/2024 0800 Last data filed at 11/04/2024 2015 Gross per 24 hour  Intake 828 ml  Output --  Net 828 ml     Wound 10/11/24 1630 Pressure Injury Heel Right Unstageable - Full thickness tissue loss in which the base of the injury is covered by slough (yellow, tan, gray, green or brown) and/or eschar (tan, brown or black) in the wound bed. (Active)     Wound 10/22/24 0945 Pressure Injury Foot Right Stage 1 -  Intact skin with non-blanchable redness of a localized area usually over a bony prominence. (Active)    Physical Exam: Vital Signs Blood pressure 107/74, pulse 94, temperature 98.3 F (36.8 C), resp. rate 18, height 5' 9 (1.753 m), weight 86.2 kg, SpO2 99%.         General: awake, alert, appropriate, sitting up in bed; 90% of tray finished; NAD HENT: conjugate gaze; oropharynx moist CV: regular rate and rhythm; no JVD Pulmonary: CTA B/L; no W/R/R- good air movement GI: soft, NT, ND, (+)BS- mor enormoactive Psychiatric: appropriate Neurological: Ox3   Ext: no clubbing, cyanosis, or edema although RLE ACE wrapped this AM- cannot assess  on direct visual exam   PRIOR EXAMS: General: Skin is warm. Wounds presenting still similar to below -vac removed from right lower leg -assessed wounds /incisions via pics- sutures stretches across- but great granulation tissue- very superficial, healing well.  Assessed pics of R heel and leg- as below         Neurological:     Comments: Patient is alert.     Oriented x 3 and follows commands. CN exam non-focal. Fair insight and awareness. MMT: BUE 5/5. LLE limited by  pain but appears to be moving all muscle groups. RLE limited by wounds/dressings, etc. Sensory exam normal for light touch and pain except for right foot where there is diminished LT along dorsum and to a lesser extent plantar aspect of foot.  No limb ataxia or cerebellar signs. No abnormal tone appreciated.     Musc: right heel cord tight- still about  minus 10-deg             Assessment/Plan: 1. Functional deficits which require 3+  hours per day of interdisciplinary therapy in a comprehensive inpatient rehab setting. Physiatrist is providing close team supervision and 24 hour management of active medical problems listed below. Physiatrist and rehab team continue to assess barriers to discharge/monitor patient progress toward functional and medical goals  Care Tool:  Bathing    Body parts bathed by patient: Right arm, Left arm, Chest, Abdomen, Face, Front perineal area, Buttocks, Right upper leg, Left upper leg, Left lower leg   Body parts bathed by helper: Front perineal area, Buttocks, Right upper leg, Left lower leg, Right lower leg, Left upper leg Body parts n/a: Right lower leg   Bathing assist Assist Level: Supervision/Verbal cueing     Upper Body Dressing/Undressing Upper body dressing   What is the patient wearing?: Pull over shirt, Orthosis    Upper body assist Assist Level: Independent with assistive device    Lower Body Dressing/Undressing Lower body dressing      What is the patient  wearing?: Pants, Underwear/pull up     Lower body assist Assist for lower body dressing: Supervision/Verbal cueing     Toileting Toileting    Toileting assist Assist for toileting: Contact Guard/Touching assist     Transfers Chair/bed transfer  Transfers assist  Chair/bed transfer activity did not occur: Safety/medical concerns  Chair/bed transfer assist level: Supervision/Verbal cueing     Locomotion Ambulation   Ambulation assist   Ambulation activity did not occur: Safety/medical concerns  Assist level: Supervision/Verbal cueing Assistive device: Walker-rolling Max distance: 145'   Walk 10 feet activity   Assist  Walk 10 feet activity did not occur: Safety/medical concerns  Assist level: Supervision/Verbal cueing Assistive device: Walker-rolling   Walk 50 feet activity   Assist Walk 50 feet with 2 turns activity did not occur: Safety/medical concerns  Assist level: Supervision/Verbal cueing Assistive device: Walker-rolling    Walk 150 feet activity   Assist Walk 150 feet activity did not occur: Safety/medical concerns  Assist level: Supervision/Verbal cueing Assistive device: Walker-rolling    Walk 10 feet on uneven surface  activity   Assist Walk 10 feet on uneven surfaces activity did not occur: Safety/medical concerns   Assist level: Contact Guard/Touching assist Assistive device: Walker-rolling   Wheelchair     Assist Is the patient using a wheelchair?: Yes Type of Wheelchair: Manual Wheelchair activity did not occur: Safety/medical concerns  Wheelchair assist level: Independent Max wheelchair distance: >200'    Wheelchair 50 feet with 2 turns activity    Assist    Wheelchair 50 feet with 2 turns activity did not occur: Safety/medical concerns   Assist Level: Supervision/Verbal cueing   Wheelchair 150 feet activity     Assist  Wheelchair 150 feet activity did not occur: Safety/medical concerns   Assist  Level: Supervision/Verbal cueing   Blood pressure 107/74, pulse 94, temperature 98.3 F (36.8 C), resp. rate 18, height 5' 9 (1.753 m), weight 86.2 kg, SpO2 99%.  Medical Problem List and Plan: 1. Functional deficits secondary to PAD/compartment syndrome/right leg ischemia/status post thrombectomy.  Status post right leg anterior compartment fasciotomy 9/26 with multiple washouts as well as excisional debridement/wound VAC.  Patient is completing a course of doxycycline             -patient may not yet shower             -ELOS/Goals: 11/11,  supervision goals  -ordered PRAFO to help stretch right heel cord  D/c moved to 11/7 Con't CIR PT and OT Will call Vascular about  BP difference in each arm- see if I'm calling the right service. Will also touch base about sutures in R leg.   Vascular OK with BP differences in each arm Per pt they havent' come to see him- but will arrange a f/u. D/C today-  2.  Antithrombotics: -DVT/anticoagulation:  Pharmaceutical: now on eliquis 5mg  BID             -antiplatelet therapy: Aspirin  81 mg daily 3. Pain Management: Lyrica  150 mg 3 times daily,  tylenol  prn.    10/22 doing well with tylenol  alone. Off all narcotics (prefers this) 10/24 pt doesn't want to increase lyrica  any further at this point (not too much higher it can go) 10/27- pain tolerable- spikes ~ 12x/day but going well otherwise with tylenol  alone 11/3-11/4  pain mainly tolerable- doesn't want other meds 11/6- d/c Cymbalta - dont' need for nerve pain 4. Mood/Behavior/Sleep: Cymbalta  30 mg nightly, Celexa  20 mg daily             -antipsychotic agents: N/A  -ambien  PRN 5. Neuropsych/cognition: This patient is capable of making decisions on his own behalf. 6. Skin/Wound Care: Routine skin checks -vac removed-->Vashe soaked gauze over wound,dry gauze, kerlix, ACE --daily change -local care and pressure relief as needed to multiple other wounds. On doxycycline             -prevalon boot/pressure  relief, right heel wound  10/28- Said needed vinegar for a wound on R heel- will d/w nursing  10/30- assessed RLE wounds- yesterday-  looking better and heel stable 10/31- went over pics- heel about the same- asked nursing to get vinegar soaks 11/3- changed the wound care orders per nursing coordinator 11/5- Wound looks more beefy red for incision- heel looks about the same- a lot of eschar. Same with elbow- but top of foot looks great 7. Fluids/Electrolytes/Nutrition: Routine and analysis with follow-up chemistries, continue vitamins/supplements.   8. Lumbar radiculopathy/large disc herniation spinal stenosis.  Status post L4-5 transforaminal interbody fusion/posterior lateral fusion insertion interbody device L4-5 decompression 09/08/2024 per Dr. Beuford.  Back brace when out of bed  -10/24--Dr. Letitia revised TLSO to LSO which he needs to wear when standing or ambulating   -back precautions ongoing  11/4- working on not breaking back precautions and wiping to have BM- ongoing therapy for this  11/6- doing better- going home tomorrow 9.  Acute blood loss anemia.  Follow-up CBC 10/14- Pt has ~ 150-200cc out of VAC- not sure since when? Hb down 1/2 unit from 8.9 to 8.4- will recheck Thursday  10/15- Hb will check in AM  10/17- Hb 9.0  10/27- Hb 9.9- VAC off--> up to 10.5 10/31  11/3- Hb 10.3- plts down somewhat to 165k- from 243k- will recheck before d/c   11/6- Hb 10/7 and platelets 170k 10.  AKI.  Resolved after IV hydration. 11.  Hyperlipidemia.  Crestor  20mg  daily 12.  COPD/remote tobacco use.  Monitor oxygen  saturations every shift 10/15- was on O2 this AM- per nursing from overnight only- but Sats 89-91%- getting CXR and ABG to see where he is- and getting sepsis work up.   10/16- started on Unasyn and will start Mucinex for coarse cough-   11/3- occ COPD cough, but sounds clear 13.  History of prostate cancer/urinary retention.  History of status post prostatectomy.  External  catheter currently in place.  Continue Flomax  0.4 mg daily 10/14- is voiding- but had urinary retention last time- will check a few bladder scans.  10/15 -  pt needing in/out caths- had >1400cc in bladder- has cathing protocol now 10/16-10/17 has orders to bladder scan and in/out if volumes >350cc- requiring caths sometimes  10/18 may need to cath more frequently if he's having volumes >1000cc  10/19 cath volumes a little less yesterday--continue with current mgt  10/24 still having intermittent retention   -continue low dose urecholine at high volumes   -increase urecholine to 25mg  tid, continue flomax    -up to EOB/toilet to empty 10/29-11/2 no caths required- voiding well now 11/4- Will reduce Urecholine to 10 mg TID to try and wean off before d/c 11/5- also could be adding to Constipation- so will wean off tomorrow 11/6- stop Urecholine 11/7- con't Flomax  until seen In nf/u 14.  History of detached retina left eye.  Follow-up outpatient ophthalmology. 15. Severe constipation 10/14- LBM at least 4-5 days ago per documentation- pt doesn't remember when went last- will get KUB- and order Sorbitol  30cc- and SSE if no results- having abd pain.  10/16- Pt had 1 liquid stool- but otherwise, no other stools in 6-7 days- last KUB shows on 10/14 was full of stool- will give Sorbitol  since likely has hard stool that liquid getting by- and SSE- if that doesn't work, will ask nursing to do Mg citrate.   10/17- had massive BM  per pt- per chart 1 large, 1 medium-   10/18 smear yesterday. Continue senna-s and increase miralax  to bid. 11/5- LBM 4 days ago- has Sorbitol -  will increase Senna to 2 tabs at bedtime- duloxetine  is probably (and Lyrica ) can cause constipation- will d/w pt about stopping Duloxetine .  11/6- stopped Duloxetine - had medium BM 16.  Hypokalemia  10/14- K+ 3.2- will replete KCL 40 Meq x1. -- fine on 10/23 10/27- K+ 3.8 17. Low grade temp in setting of delirium  10/15- will get  sepsis work up, ammonia level and ABG  10/17- has HCAP- Per #21 18. Cirrhosis on last Abd imaging  10/15- will get Ammonia as well esp with new jerking/tremors.  10/16- is actually shivering this AM, but isn't cold- Ammonia 16- not the cause of Sx's.   10/20- ALT is 47- slightly up but AST down to 37- can con't tylenol  as ordered  10/28- will check CMP next time 10/30- changed to tomorrow- hasn't been drawn yet this AM at of 11am  10/31- Labs not done yet from yesterday or today- checking with nursing as to why-- LFTs normalized 19. Infrarenal aortic aneurysm with mural thrombus- 6.8 cm on last imaging  Needs f/u.  20. Orthostatic hypotension 10/16- On Flomax , however No BP meds that I can reduce- will check with therapy- if dropping more, will add Midodrine- ordering ACE wrap on LLE  -addendum- adding Midodrine 5 mg TID starting at 630 am  10/19 bp's a little more robust over weekend thus far. No changes today  10/22-24 improved on midodrine 10/28- 10/30- BP doing better on Midodrine- still a little soft, but better. Con't to monitor 10/31- will increase midodrine to 10 mg TID_ and changed times to 630, 1030 and 230-  -11/1-2/25 BPs stable, occasionally soft, has fluids running @100ml /hr 11/3- OFF IVFs- will have nursing check BP in B/L UEs and see if different- if so , will call Vascular 11/4- spoke with nursing about getting BP's in different arms 11/5- BP is 30 points difference in R and L arms- R is 30 points lower.  11/6- still soft, will need f/u with me due to being on Midodrine and foot drop  on RLE Vitals:   11/02/24 0519 11/02/24 1127 11/02/24 1128 11/02/24 1324  BP: 109/84 106/78 131/71 106/81   11/02/24 1958 11/03/24 0430 11/03/24 1306 11/03/24 1954  BP: 105/79 115/72 118/72 (!) 101/57   11/04/24 0422 11/04/24 1413 11/04/24 1935 11/05/24 0438  BP: 103/76 99/80 101/65 107/74    21. HCAP 10/16- on Unasyn-  will con't for 5-6 days- monitor WBC- down to 12.7 from 13.2 last  evening. Shivering could be due to breaking a temp>>?? 10/17- no tremors/shivering- will recheck CBC in AM- and make sure on right ABX-  and CMP due to LFTS/renal check on IV ABX- Added Albuterol  scheduled QID due to wheezing and bad COPD   10/20- WBC down to 8.2-   Unasyn completed  22. Urinary dropping urinal  11/3- Asked nursing to get male urinal to help     I spent a total of  36  minutes on total care today- >50% coordination of care- due to  D/w pt about midodrine and Lyrica - also d/w PA about pt d/c. Off Urecholine- just Flomax  for bladder-   The patient is medically ready for discharge to home and will need follow-up with [facility]. In addition, they will need to follow up with their PCP, Vascular and Orthopedics.    LOS: 25 days A FACE TO FACE EVALUATION WAS PERFORMED  Jerrico Covello 11/05/2024, 8:00 AM

## 2024-11-08 ENCOUNTER — Other Ambulatory Visit: Payer: Self-pay

## 2024-11-08 DIAGNOSIS — I739 Peripheral vascular disease, unspecified: Secondary | ICD-10-CM

## 2024-11-10 ENCOUNTER — Encounter: Payer: Self-pay | Admitting: Family

## 2024-11-10 ENCOUNTER — Ambulatory Visit (INDEPENDENT_AMBULATORY_CARE_PROVIDER_SITE_OTHER): Admitting: Family

## 2024-11-10 DIAGNOSIS — Z9889 Other specified postprocedural states: Secondary | ICD-10-CM

## 2024-11-10 DIAGNOSIS — L89610 Pressure ulcer of right heel, unstageable: Secondary | ICD-10-CM

## 2024-11-10 NOTE — Progress Notes (Signed)
 Post-Op Visit Note   Patient: David King           Date of Birth: 08-30-1956           MRN: [id number] Visit Date: 11/10/2024 PCP: [name], MD  Chief Complaint:  Chief Complaint  Patient presents with   Right Leg - Routine Post Op    09/29/24 and 10/06/24 I&D right calf    HPI:  HPI The patient is a 68 year old gentleman seen status post repeat debridements of the right leg/calf. most recently on October 8.  Also has decubitus ulcer to the right heel this has been dry his home health nurse is wondering if it needs to be debrided Ortho Exam On examination right lower extremity the anterior wound is filled in with 95% bleeding granulation the incisions are well-healed sutures are in place there is about 5% fibrinous exudative tissue there is 1 exposed absorbable suture  The right heel with a 2-1/2 cm in diameter decubitus ulcer this is dry there is no surrounding erythema warmth no drainage  Palpable dorsalis pedis pulse  Visit Diagnoses: No diagnosis found.  Plan: Will continue with Vashe to dry dressing changes to the anterior calf.  Dry dressings to the heel offload the heel for plan follow-up in the office in about 10 days.  Follow-Up Instructions: No follow-ups on file.   Imaging: No results found.  Orders:  No orders of the defined types were placed in this encounter.  No orders of the defined types were placed in this encounter.    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 02/20/2022   Degeneration, intervertebral disc, lumbar 09/10/2021   Osteoarthritis of spine with radiculopathy, lumbar region 09/10/2021   Low back pain 06/27/2021   Gastroesophageal reflux disease 06/15/2021   Hardening of the aorta (main artery of the heart) 06/15/2021   History of malignant neoplasm of prostate 06/15/2021   Insomnia 06/15/2021   Vitamin D deficiency 06/15/2021   Essential hypertension 01/27/2020   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   Former smoker 08/05/2019   Pulmonary emphysema (HCC) 08/05/2019   Coronary artery disease involving native coronary artery of native heart without angina pectoris 03/06/2018   Unstable angina (HCC) 02/24/2018   Spinal stenosis of lumbar region 10/07/2013   Prostate cancer [facility])    Past Medical History:  Diagnosis Date   Anxiety    new dx   Arthritis    lumbar   Atherosclerotic vascular disease    Cataract    COPD (chronic obstructive pulmonary disease) (HCC)    Diverticulosis    Elevated PSA    Headache(784.0)    MIGRAINES   Hypertension 2021   Iritis    CHRONIC IN LEFT EYE - SOME VISIAL IMPAIRMENT IN LEFT EYE   Neuromuscular disorder (HCC)    left leg/foot,pinched siactic nerve   Pain    PAIN LEFT HIP AND DOWN LT LEG WITH NUMBNESS IN LEFT LEG--PT STATES SCIATIC NERVE IMPINGEMENT - PT PLANS BACK IN THE NEAR SURGERY.   Peripheral vascular disease  Prostate cancer (HCC) 03/05/2013   Adenocarcinoma   Renal cysts, acquired, bilateral 03/19/2013   several simple , CT   Urinary frequency    AND NOCTURIA    Family History  Problem Relation Age of Onset   Cancer Father        prostate cancer   Cancer Paternal Grandfather        prostate   Colon cancer Neg Hx    Colon polyps Neg Hx    Esophageal cancer Neg Hx    Rectal cancer Neg Hx    Stomach cancer Neg Hx     Past Surgical History:  Procedure Laterality Date   ABDOMINAL AORTOGRAM W/LOWER EXTREMITY N/A 09/21/2024    Procedure: ABDOMINAL AORTOGRAM W/LOWER EXTREMITY;  Surgeon: Serene Gaile ORN, MD;  [facility];  Service: Cardiovascular;  Laterality: N/A;   APPLICATION OF WOUND VAC Right 09/27/2024   Procedure: APPLICATION, WOUND VAC RIGHT LOWER EXTREMITY;  Surgeon: Lanis Fonda BRAVO, MD;  Location: [facility] OR;  Service: Vascular;  Laterality: Right;   APPLICATION OF WOUND VAC Right 10/01/2024   Procedure: APPLICATION, WOUND VAC;  Surgeon: Lanis Fonda BRAVO, MD;  Location: [facility] OR;  Service: Vascular;  Laterality: Right;   APPLICATION, SKIN SUBSTITUTE Right 09/27/2024   Procedure: APPLICATION, MYRIAD SKIN SUBSTITUTE;  Surgeon: Lanis Fonda BRAVO, MD;  Location: [facility] OR;  Service: Vascular;  Laterality: Right;   APPLICATION, SKIN SUBSTITUTE Right 10/01/2024   Procedure: APPLICATION, SKIN SUBSTITUTE KERECIS 95SQ CM;  Surgeon: Lanis Fonda BRAVO, MD;  [facility];  Service: Vascular;  Laterality: Right;   AXILLARY-FEMORAL BYPASS GRAFT Right 09/24/2024   Procedure: CREATION, BYPASS, ARTERIAL, AXILLARY TO BILATERAL FEMORAL, USING PROPATEN X 80CM X 2 GRAFT;  Surgeon: Serene Gaile ORN, MD;  [facility];  Service: Vascular;  Laterality: Right;   BACK SURGERY     CARDIAC CATHETERIZATION  03/10/2018   CATARACT EXTRACTION W/ INTRAOCULAR LENS IMPLANT Left    ENDARTERECTOMY FEMORAL Bilateral 09/24/2024   Procedure: BILATERAL FEMORAL ENDARTERECTOMY;  Surgeon: Serene Gaile ORN, MD;  [facility];  Service: Vascular;  Laterality: Bilateral;   EYE SURGERY     RETINAL SURGERY LEFT EYE   FASCIOTOMY Right 09/24/2024   Procedure: RIGHT LOWER LEG FOUR COMPARTMENT FASCIOTOMY WITH RESECTION OF TIBIALIS ANTERIOR;  Surgeon: Serene Gaile ORN, MD;  [facility];  Service: Vascular;  Laterality: Right;   FASCIOTOMY CLOSURE Right 09/27/2024   Procedure: RIGHT LOWER EXTREMITY FASCIOTOMY WASHOUT;  Surgeon: Lanis Fonda BRAVO, MD;  Location: [facility] OR;  Service: Vascular;  Laterality: Right;  RIGHT LEG CLOSURE   HYDROCELE EXCISION  Left 03/05/2013   Procedure: HYDROCELECTOMY ADULT;  Surgeon: Donnice Gwenyth Brooks, MD;  Location: [facility];  Service: Urology;  Laterality: Left;   INCISION AND DRAINAGE OF DEEP ABSCESS, CALF Right 09/29/2024   Procedure: INCISION AND DRAINAGE OF DEEP ABSCESS, CALF;  Surgeon: Harden Jerona GAILS, MD;  [facility];  Service: Orthopedics;  Laterality: Right;   INCISION AND DRAINAGE OF DEEP ABSCESS, CALF Right 10/06/2024   Procedure: INCISION AND DRAINAGE OF DEEP ABSCESS, CALF RIGHT;  Surgeon: Harden Jerona GAILS, MD;  [facility];  Service: Orthopedics;  Laterality: Right;  DEBRIDEMENT RIGHT LEG   INCISION AND DRAINAGE OF WOUND Right 10/01/2024   Procedure: IRRIGATION AND DEBRIDEMENT WOUND;  Surgeon: Lanis Fonda BRAVO, MD;  Location: [facility] OR;  Service: Vascular;  Laterality: Right;   INCISIONAL HERNIA REPAIR N/A 06/18/2022   Procedure: REPAIR OF INTERNAL HERNIA;  Surgeon: Rubin,  Lynda, MD;  [facility];  Service: General;  Laterality: N/A;   INGUINAL HERNIA REPAIR Bilateral AGE 9   INSERTION OF MESH N/A 06/11/2022   Procedure: INSERTION OF MESH;  Surgeon: Rubin Lynda, MD;  Location: [facility] OR;  Service: General;  Laterality: N/A;   INSERTION OF MESH N/A 06/18/2022   Procedure: INSERTION OF MESH;  Surgeon: Rubin Lynda, MD;  Location: [facility] OR;  Service: General;  Laterality: N/A;   KYPHOPLASTY N/A 05/08/2023   Procedure: THORACIC SEVEN KYPHOPLASTY;  Surgeon: Beuford Anes, MD;  [facility];  Service: Orthopedics;  Laterality: N/A;   LAPAROSCOPY N/A 06/18/2022   Procedure: LAPAROSCOPY DIAGNOSTIC;  Surgeon: Rubin Lynda, MD;  Location: [facility] OR;  Service: General;  Laterality: N/A;   LOWER EXTREMITY ANGIOGRAM Right 09/24/2024   Procedure: RIGHT LOWER EXTREMITY ANGIOGRAM AND RIGHT POPLETEAL THROMBECTOMY;  Surgeon: Serene Gaile ORN, MD;  [facility];  Service: Vascular;  Laterality: Right;   LYMPHADENECTOMY Bilateral 05/06/2013   Procedure: REDGIE;  Surgeon:  Noretta Ferrara, MD;  Location: [facility];  Service: Urology;  Laterality: Bilateral;   PROSTATE BIOPSY N/A 03/05/2013   Procedure: PROSTATE BIOPSY and ultrasound;  Surgeon: Donnice Gwenyth Brooks, MD;  Location: [facility];  Service: Urology;  Laterality: N/A;   REMOVAL BURSA SAC, LEFT ELBOW  1996   ROBOT ASSISTED LAPAROSCOPIC RADICAL PROSTATECTOMY N/A 05/06/2013   Procedure: ROBOTIC ASSISTED LAPAROSCOPIC RADICAL PROSTATECTOMY LEVEL 3;  Surgeon: Noretta Ferrara, MD;  [facility]: [facility];  Service: Urology;  Laterality: N/A;   THROMBECTOMY FEMORAL ARTERY Bilateral 09/24/2024   Procedure: BILATERAL FEMORAL ARTERY THROMBECTOMY;  Surgeon: Serene Gaile ORN, MD;  [facility];  Service: Vascular;  Laterality: Bilateral;   TONSILLECTOMY     as achild   TRANSFORAMINAL LUMBAR INTERBODY FUSION (TLIF) WITH PEDICLE SCREW FIXATION 1 LEVEL Left 09/08/2024   Procedure: LEFT-SIDED LUMBAR 4- LUMBAR 5 TRANSFORAMINAL LUMBAR INTERBODY FUSION AND DECOMPRESSION WITH INSTRUMENTATION AND ALLOGRAFT;  Surgeon: Beuford Anes, MD;  [facility];  Service: Orthopedics;  Laterality: Left;  LEFT-SIDED LUMBAR 4- LUMBAR 5 TRANSFORAMINAL LUMBAR INTERBODY FUSION AND DECOMPRESSION WITH INSTRUMENTATION AND ALLOGRAFT   XI ROBOTIC ASSISTED VENTRAL HERNIA N/A 06/11/2022   Procedure: ROBOTIC INCISIONAL HERNIA REPAIR WITH MESH;  Surgeon: Rubin Lynda, MD;  Location: [facility] OR;  Service: General;  Laterality: N/A;   Social History   Occupational History   Not on file  Tobacco Use   Smoking status: Former    Current packs/day: 0.00    Average packs/day: 2.0 packs/day for 47.0 years (94.0 ttl pk-yrs)    Types: Cigarettes    Start date: 12/31/1969    Quit date: 12/31/2016    Years since quitting: 7.8   Smokeless tobacco: Never   Tobacco comments:    Patient is currently smoke free since January 2018  Vaping Use   Vaping status: Never Used  Substance and Sexual Activity   Alcohol use: Yes    Alcohol/week: 14.0 standard drinks  of alcohol    Types: 14 Shots of liquor per week    Comment: couple of drinks a night   Drug use: No   Sexual activity: Never

## 2024-11-22 ENCOUNTER — Telehealth: Payer: Self-pay | Admitting: Physical Medicine and Rehabilitation

## 2024-11-22 NOTE — Telephone Encounter (Signed)
 Paralee Memo (PT) from [facility] called about David King. She want you to send verbal or written request for his physical therapy. You can leave her a VM [phone], so the she can start the PT.

## 2024-11-23 NOTE — Progress Notes (Signed)
 Subjective:    Patient ID: David King, male    DOB: 07-24-1956, 68 y.o.   MRN: [id number]  HPI: David CAPPELLETTI is a 68 y.o. 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 He presented to [facility] on 09/08/2024 with low back pain radiating to left lower extremity.  Dr Beuford: H&P Note: 09/08/2024 HPI: David King is a 68 y.o. 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  The patient is status post a previous decompression at L4-5.   Patient has failed multiple forms of conservative care and continues to have pain (see office notes for additional details regarding the patient's full course of treatment)   He underwent: on 09/08/2024 LEFT-SIDED LUMBAR 4- LUMBAR 5 TRANSFORAMINAL LUMBAR INTERBODY FUSION AND DECOMPRESSION WITH INSTRUMENTATION AND ALLOGRAFT Left General  LEFT-SIDED LUMBAR 4- LUMBAR 5 TRANSFORAMINAL LUMBAR INTERBODY FUSION AND DECOMPRESSION WITH INSTRUMENTATION AND ALLOGRAFT       See  DISCHARGE SUMMARY FOR FULL DETAILS OF HOSPITAL STAY:   On 09/24/2024: he underwent:  CREATION, BYPASS, ARTERIAL, AXILLARY TO BILATERAL FEMORAL, USING PROPATEN X 80CM X 2 GRAFT Right General  BILATERAL FEMORAL ENDARTERECTOMY Bilateral General  BILATERAL FEMORAL ARTERY THROMBECTOMY Bilateral General  RIGHT LOWER LEG FOUR COMPARTMENT FASCIOTOMY WITH RESECTION OF TIBIALIS ANTERIOR     He was admitted to inpatient rehabilitation on 10/11/2024 and discharged home on 11/05/2024, he is receiving Home Health with [facility]. He reports he has pain in his right foot and right lower extremity. He rates his pain 5.   Mr. Leask ask if he could resume his Gabapentin , this was discussed with Dr Cornelio. Directions given for weaning Pregabalin  and resuming Gabapentin , he verbalizes understanding.    Pain  Inventory Average Pain 5 Pain Right Now 5 My pain is constant, sharp, burning, dull, stabbing, tingling, and aching  In the last 24 hours, has pain interfered with the following? General activity 7 Relation with others 0 Enjoyment of life 0 What TIME of day is your pain at its worst? daytime and evening Sleep (in general) Good  Pain is worse with: walking, inactivity, and standing Pain improves with: rest and therapy/exercise Relief from Meds: 5  use a walker how many minutes can you walk? 5 ability to climb steps?  yes do you drive?  no use a wheelchair transfers alone Do you have any goals in this area?  yes  retired I need assistance with the following:  bathing Do you have any goals in this area?  yes  numbness tremor tingling trouble walking spasms dizziness  Any changes since last visit?  no  Any changes since last visit?  no    Family History  Problem Relation Age of Onset   Cancer Father        prostate cancer   Cancer Paternal Grandfather        prostate   Colon cancer Neg Hx    Colon polyps Neg Hx    Esophageal cancer Neg Hx    Rectal cancer Neg Hx    Stomach cancer Neg Hx    Social History   Socioeconomic History   Marital status: Married    Spouse name: Not on file   Number of children: Not on file   Years of education: Not on file   Highest  education level: Not on file  Occupational History   Not on file  Tobacco Use   Smoking status: Former    Current packs/day: 0.00    Average packs/day: 2.0 packs/day for 47.0 years (94.0 ttl pk-yrs)    Types: Cigarettes    Start date: 12/31/1969    Quit date: 12/31/2016    Years since quitting: 7.9   Smokeless tobacco: Never   Tobacco comments:    Patient is currently smoke free since January 2018  Vaping Use   Vaping status: Never Used  Substance and Sexual Activity   Alcohol use: Yes    Alcohol/week: 14.0 standard drinks of alcohol    Types: 14 Shots of liquor per week    Comment: couple of  drinks a night   Drug use: No   Sexual activity: Never  Other Topics Concern   Not on file  Social History Narrative   Not on file   Social Drivers of Health   Financial Resource Strain: Low Risk  (05/05/2023)   Received from [facility]   Overall Financial Resource Strain (CARDIA)    Difficulty of Paying Living Expenses: Not hard at all  Food Insecurity: Patient Declined (09/29/2024)   Hunger Vital Sign    Worried About Programme Researcher, Broadcasting/film/video in the Last Year: Patient declined    Ran Out of Food in the Last Year: Patient declined  Transportation Needs: Patient Declined (09/29/2024)   PRAPARE - Administrator, Civil Service (Medical): Patient declined    Lack of Transportation (Non-Medical): Patient declined  Physical Activity: Unknown (05/05/2023)   Received from [facility]   Exercise Vital Sign    On average, how many days per week do you engage in moderate to strenuous exercise (like a brisk walk)?: 0 days    Minutes of Exercise per Session: Not on file  Stress: No Stress Concern Present (05/05/2023)   Received from [facility] Occupational [facility] - Occupational Stress Questionnaire    Feeling of Stress : Only a little  Social Connections: Unknown (09/29/2024)   Social Connection and Isolation Panel    Frequency of Communication with Friends and Family: Patient declined    Frequency of Social Gatherings with Friends and Family: Patient unable to answer    Attends Religious Services: Patient declined    Active Member of Clubs or Organizations: Patient declined    Attends Banker Meetings: Patient declined    Marital Status: Patient declined   Past Surgical History:  Procedure Laterality Date   ABDOMINAL AORTOGRAM W/LOWER EXTREMITY N/A 09/21/2024   Procedure: ABDOMINAL AORTOGRAM W/LOWER EXTREMITY;  Surgeon: Serene Gaile ORN, MD;  [facility];  Service: Cardiovascular;  Laterality: N/A;   APPLICATION OF WOUND VAC  Right 09/27/2024   Procedure: APPLICATION, WOUND VAC RIGHT LOWER EXTREMITY;  Surgeon: Lanis Fonda BRAVO, MD;  Location: [facility] OR;  Service: Vascular;  Laterality: Right;   APPLICATION OF WOUND VAC Right 10/01/2024   Procedure: APPLICATION, WOUND VAC;  Surgeon: Lanis Fonda BRAVO, MD;  Location: [facility] OR;  Service: Vascular;  Laterality: Right;   APPLICATION, SKIN SUBSTITUTE Right 09/27/2024   Procedure: APPLICATION, MYRIAD SKIN SUBSTITUTE;  Surgeon: Lanis Fonda BRAVO, MD;  Location: [facility] OR;  Service: Vascular;  Laterality: Right;   APPLICATION, SKIN SUBSTITUTE Right 10/01/2024   Procedure: APPLICATION, SKIN SUBSTITUTE KERECIS 95SQ CM;  Surgeon: Lanis Fonda BRAVO, MD;  Location: [facility] OR;  Service: Vascular;  Laterality: Right;   AXILLARY-FEMORAL BYPASS  GRAFT Right 09/24/2024   Procedure: CREATION, BYPASS, ARTERIAL, AXILLARY TO BILATERAL FEMORAL, USING PROPATEN X 80CM X 2 GRAFT;  Surgeon: Serene Gaile ORN, MD;  [facility];  Service: Vascular;  Laterality: Right;   BACK SURGERY     CARDIAC CATHETERIZATION  03/10/2018   CATARACT EXTRACTION W/ INTRAOCULAR LENS IMPLANT Left    ENDARTERECTOMY FEMORAL Bilateral 09/24/2024   Procedure: BILATERAL FEMORAL ENDARTERECTOMY;  Surgeon: Serene Gaile ORN, MD;  [facility];  Service: Vascular;  Laterality: Bilateral;   EYE SURGERY     RETINAL SURGERY LEFT EYE   FASCIOTOMY Right 09/24/2024   Procedure: RIGHT LOWER LEG FOUR COMPARTMENT FASCIOTOMY WITH RESECTION OF TIBIALIS ANTERIOR;  Surgeon: Serene Gaile ORN, MD;  [facility];  Service: Vascular;  Laterality: Right;   FASCIOTOMY CLOSURE Right 09/27/2024   Procedure: RIGHT LOWER EXTREMITY FASCIOTOMY WASHOUT;  Surgeon: Lanis Fonda BRAVO, MD;  Location: [facility] OR;  Service: Vascular;  Laterality: Right;  RIGHT LEG CLOSURE   HYDROCELE EXCISION Left 03/05/2013   Procedure: HYDROCELECTOMY ADULT;  Surgeon: Donnice Gwenyth Brooks, MD;  Location: [facility];  Service: Urology;  Laterality: Left;   INCISION AND  DRAINAGE OF DEEP ABSCESS, CALF Right 09/29/2024   Procedure: INCISION AND DRAINAGE OF DEEP ABSCESS, CALF;  Surgeon: Harden Jerona GAILS, MD;  [facility];  Service: Orthopedics;  Laterality: Right;   INCISION AND DRAINAGE OF DEEP ABSCESS, CALF Right 10/06/2024   Procedure: INCISION AND DRAINAGE OF DEEP ABSCESS, CALF RIGHT;  Surgeon: Harden Jerona GAILS, MD;  [facility];  Service: Orthopedics;  Laterality: Right;  DEBRIDEMENT RIGHT LEG   INCISION AND DRAINAGE OF WOUND Right 10/01/2024   Procedure: IRRIGATION AND DEBRIDEMENT WOUND;  Surgeon: Lanis Fonda BRAVO, MD;  Location: [facility] OR;  Service: Vascular;  Laterality: Right;   INCISIONAL HERNIA REPAIR N/A 06/18/2022   Procedure: REPAIR OF INTERNAL HERNIA;  Surgeon: Rubin Calamity, MD;  Location: [facility] OR;  Service: General;  Laterality: N/A;   INGUINAL HERNIA REPAIR Bilateral AGE 37   INSERTION OF MESH N/A 06/11/2022   Procedure: INSERTION OF MESH;  Surgeon: Rubin Calamity, MD;  Location: [facility] OR;  Service: General;  Laterality: N/A;   INSERTION OF MESH N/A 06/18/2022   Procedure: INSERTION OF MESH;  Surgeon: Rubin Calamity, MD;  Location: [facility] OR;  Service: General;  Laterality: N/A;   KYPHOPLASTY N/A 05/08/2023   Procedure: THORACIC SEVEN KYPHOPLASTY;  Surgeon: Beuford Anes, MD;  [facility];  Service: Orthopedics;  Laterality: N/A;   LAPAROSCOPY N/A 06/18/2022   Procedure: LAPAROSCOPY DIAGNOSTIC;  Surgeon: Rubin Calamity, MD;  Location: [facility] OR;  Service: General;  Laterality: N/A;   LOWER EXTREMITY ANGIOGRAM Right 09/24/2024   Procedure: RIGHT LOWER EXTREMITY ANGIOGRAM AND RIGHT POPLETEAL THROMBECTOMY;  Surgeon: Serene Gaile ORN, MD;  [facility];  Service: Vascular;  Laterality: Right;   LYMPHADENECTOMY Bilateral 05/06/2013   Procedure: REDGIE;  Surgeon: Noretta Ferrara, MD;  [facility];  Service: Urology;  Laterality: Bilateral;   PROSTATE BIOPSY N/A 03/05/2013   Procedure: PROSTATE BIOPSY and ultrasound;  Surgeon: Donnice Gwenyth Brooks, MD;  Location: [facility];  Service: Urology;  Laterality: N/A;   REMOVAL BURSA SAC, LEFT ELBOW  1996   ROBOT ASSISTED LAPAROSCOPIC RADICAL PROSTATECTOMY N/A 05/06/2013   Procedure: ROBOTIC ASSISTED LAPAROSCOPIC RADICAL PROSTATECTOMY LEVEL 3;  Surgeon: Noretta Ferrara, MD;  [facility];  Service: Urology;  Laterality: N/A;   THROMBECTOMY FEMORAL ARTERY Bilateral 09/24/2024   Procedure: BILATERAL FEMORAL ARTERY THROMBECTOMY;  Surgeon: Serene Gaile ORN, MD;  Location: [facility] OR;  Service: Vascular;  Laterality: Bilateral;   TONSILLECTOMY     as achild   TRANSFORAMINAL LUMBAR INTERBODY FUSION (TLIF) WITH PEDICLE SCREW FIXATION 1 LEVEL Left 09/08/2024   Procedure: LEFT-SIDED LUMBAR 4- LUMBAR 5 TRANSFORAMINAL LUMBAR INTERBODY FUSION AND DECOMPRESSION WITH INSTRUMENTATION AND ALLOGRAFT;  Surgeon: Beuford Anes, MD;  [facility];  Service: Orthopedics;  Laterality: Left;  LEFT-SIDED LUMBAR 4- LUMBAR 5 TRANSFORAMINAL LUMBAR INTERBODY FUSION AND DECOMPRESSION WITH INSTRUMENTATION AND ALLOGRAFT   XI ROBOTIC ASSISTED VENTRAL HERNIA N/A 06/11/2022   Procedure: ROBOTIC INCISIONAL HERNIA REPAIR WITH MESH;  Surgeon: Rubin Calamity, MD;  [facility];  Service: General;  Laterality: N/A;   Past Medical History:  Diagnosis Date   Anxiety    new dx   Arthritis    lumbar   Atherosclerotic vascular disease    Cataract    COPD (chronic obstructive pulmonary disease) (HCC)    Diverticulosis    Elevated PSA    Headache(784.0)    MIGRAINES   Hypertension 2021   Iritis    CHRONIC IN LEFT EYE - SOME VISIAL IMPAIRMENT IN LEFT EYE   Neuromuscular disorder (HCC)    left leg/foot,pinched siactic nerve   Pain    PAIN LEFT HIP AND DOWN LT LEG WITH NUMBNESS IN LEFT LEG--PT STATES SCIATIC NERVE IMPINGEMENT - PT PLANS BACK IN THE NEAR SURGERY.   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   Renal cysts, acquired, bilateral 03/19/2013   several  simple , CT   Urinary frequency    AND NOCTURIA   There were no vitals taken for this visit.  Opioid Risk Score:   Fall Risk Score:  `1  Depression screen PHQ 2/9      No data to display          Review of Systems  Cardiovascular:  Positive for leg swelling.       Right lower leg swelling  Musculoskeletal:  Positive for gait problem.       Spasms  Neurological:  Positive for dizziness, tremors and numbness.       Tingling  All other systems reviewed and are negative.      Objective:   Physical Exam Vitals reviewed.  Constitutional:      Appearance: Normal appearance.  Cardiovascular:     Rate and Rhythm: Normal rate and regular rhythm.     Pulses: Normal pulses.     Heart sounds: Normal heart sounds.  Pulmonary:     Effort: Pulmonary effort is normal.     Breath sounds: Normal breath sounds.  Musculoskeletal:     Comments: Normal Muscle Bulk and Muscle Testing Reveals:  Upper Extremities: Right: Decreased ROM 90 Degrees and Muscle Strength 5/5 Left Upper Extremity: Full ROM and Muscle Strength 5/5  Lower Extremities: Right: Decreased ROM and Muscle Strrength 4/5  Wearing AFO Left Lower Extremity: Full ROM and Muscle Strength 5/5 Arrived in wheelchair     Skin:    General: Skin is warm and dry.  Neurological:     Mental Status: He is alert and oriented to person, place, and time.  Psychiatric:        Mood and Affect: Mood normal.        Behavior: Behavior normal.          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 Continue current medication regimen. Continue  to monitor. Ortho and Vascular Following.  2, Chronic Pain Syndrome: Instructions given: Pregabalin  wean and Resuming Gabapentin : This was discussed with Dr Mickie. Mr. Angelillo verbalizes understanding.   F/U with Dr Lovorn in 4- 6 weeks

## 2024-11-24 ENCOUNTER — Encounter: Attending: Registered Nurse | Admitting: Registered Nurse

## 2024-11-24 ENCOUNTER — Telehealth: Payer: Self-pay | Admitting: Registered Nurse

## 2024-11-24 ENCOUNTER — Encounter: Payer: Self-pay | Admitting: Registered Nurse

## 2024-11-24 ENCOUNTER — Encounter: Admitting: Family

## 2024-11-24 ENCOUNTER — Ambulatory Visit (INDEPENDENT_AMBULATORY_CARE_PROVIDER_SITE_OTHER): Admitting: Family

## 2024-11-24 ENCOUNTER — Encounter: Payer: Self-pay | Admitting: Family

## 2024-11-24 VITALS — BP 124/65 | HR 70 | Ht 69.0 in

## 2024-11-24 DIAGNOSIS — T79A21D Traumatic compartment syndrome of right lower extremity, subsequent encounter: Secondary | ICD-10-CM | POA: Insufficient documentation

## 2024-11-24 DIAGNOSIS — Z9889 Other specified postprocedural states: Secondary | ICD-10-CM | POA: Insufficient documentation

## 2024-11-24 DIAGNOSIS — G894 Chronic pain syndrome: Secondary | ICD-10-CM | POA: Diagnosis present

## 2024-11-24 DIAGNOSIS — L89610 Pressure ulcer of right heel, unstageable: Secondary | ICD-10-CM

## 2024-11-24 MED ORDER — PREGABALIN 100 MG PO CAPS: ORAL_CAPSULE | ORAL | 0 refills | Status: DC

## 2024-11-24 MED ORDER — APIXABAN 5 MG PO TABS: 5.0000 mg | ORAL_TABLET | Freq: Two times a day (BID) | ORAL | 0 refills | Status: AC

## 2024-11-24 NOTE — Telephone Encounter (Signed)
 Spoke with David King regarding gabapentin  instructions.  He verbalizes understanding.

## 2024-11-24 NOTE — Patient Instructions (Addendum)
 Begin Slow weaning of Lyrica  ( Pregabalin ) : Spoke with Dr Cornelio: Her Recommendations.  Start on 11/29/2024:  David King will resume his Gabapentin  300 mg capsules : take one  capsules ( 300 mg)  Twice a day. With his Current Pregabalin  ( Lyrica  Dose 58f 150 mg three times a day.   On 12/06/2024  Increase  Gabapentin   300 mg one capsule three times a day Decrease Lyrica  Pregabalin  100 mg three times a day  for a week   On 12/13/2024 Continue Gabapentin   300 mg three times a day  Decrease Lyrica  100 mg twice a day for a week    On 12/20/2024  Decrease Lyrica  100 mg at Bedtime for a week then stop.   Call office with all questions and concerns.

## 2024-11-24 NOTE — Progress Notes (Signed)
 Post-Op Visit Note   Patient: David King           Date of Birth: Sep 13, 1956           MRN: [id number] Visit Date: 11/24/2024 PCP: [name], MD  Chief Complaint:  Chief Complaint  Patient presents with   Right Leg - Routine Post Op    09/29/24 and 10/06/24 I&D right calf    HPI:  HPI The patient is a 68 year old gentleman seen status post repeat debridements of the right lower extremity also with a decubitus ulcer of the right heel has been doing Vashe to dry dressings for the last 2 weeks Ortho Exam Please see attached image.  On examination right lower extremity anterior ulcer is improving there is new epithelization today this measures 11.2 cm x 4.5 cm with 5 millimeters of depth.  There is about 10% fibrinous exudative tissue over the medial border  On examination the heel decubitus ulcer is stable at about 2-1/2 cm in diameter there is some slight unroofing of the distal aspect with some serous drainage there is no surrounding erythema or warmth  Visit Diagnoses: No diagnosis found.  Plan: Continue packing the leg ulcer open with Vashe to dry dressings elevate for swelling discussed offloading the heel may continue with dry dressings if this becomes wet they begin Vashe to dry dressings of the heel  Follow-Up Instructions: No follow-ups on file.   Imaging: No results found.  Orders:  No orders of the defined types were placed in this encounter.  No orders of the defined types were placed in this encounter.    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 09/29/2024   Lumbar radiculopathy 09/15/2024   S/P lumbar fusion 09/08/2024   Radiculopathy due to lumbar intervertebral disc disorder 09/08/2024   Ileus following gastrointestinal surgery (HCC) 06/13/2022   Hx of fasciotomy 06/11/2022   Claudication 02/20/2022    Incisional hernia, without obstruction or gangrene 02/20/2022   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 02/20/2022   Degeneration, intervertebral disc, lumbar 09/10/2021   Osteoarthritis of spine with radiculopathy, lumbar region 09/10/2021   Low back pain 06/27/2021   Gastroesophageal reflux disease 06/15/2021   Hardening of the aorta (main artery of the heart) 06/15/2021   History of malignant neoplasm of prostate 06/15/2021   Insomnia 06/15/2021   Vitamin D  deficiency 06/15/2021   Essential hypertension 01/27/2020   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   Former smoker 08/05/2019   Pulmonary emphysema (HCC) 08/05/2019   Coronary artery disease involving native coronary artery of native heart without angina pectoris 03/06/2018   Unstable angina (HCC) 02/24/2018   Spinal stenosis of lumbar region 10/07/2013   Prostate cancer [facility])    Past Medical History:  Diagnosis Date   Anxiety    new dx   Arthritis    lumbar   Atherosclerotic vascular disease    Cataract    COPD (chronic obstructive pulmonary disease) (HCC)    Diverticulosis    Elevated PSA    Headache(784.0)    MIGRAINES   Hypertension 2021   Iritis    CHRONIC IN LEFT EYE - SOME VISIAL IMPAIRMENT IN LEFT EYE   Neuromuscular disorder (HCC)    left leg/foot,pinched siactic nerve   Pain    PAIN LEFT HIP AND DOWN LT LEG WITH NUMBNESS IN LEFT LEG--PT STATES SCIATIC NERVE IMPINGEMENT - PT PLANS  BACK IN THE NEAR SURGERY.   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   Renal cysts, acquired, bilateral 03/19/2013   several simple , CT   Urinary frequency    AND NOCTURIA    Family History  Problem Relation Age of Onset   Cancer Father        prostate cancer   Cancer Paternal Grandfather        prostate   Colon cancer Neg Hx    Colon polyps Neg Hx    Esophageal cancer Neg Hx    Rectal cancer Neg Hx    Stomach cancer Neg Hx     Past Surgical History:  Procedure Laterality  Date   ABDOMINAL AORTOGRAM W/LOWER EXTREMITY N/A 09/21/2024   Procedure: ABDOMINAL AORTOGRAM W/LOWER EXTREMITY;  Surgeon: Serene Gaile ORN, MD;  [facility]: [facility];  Service: Cardiovascular;  Laterality: N/A;   APPLICATION OF WOUND VAC Right 09/27/2024   Procedure: APPLICATION, WOUND VAC RIGHT LOWER EXTREMITY;  Surgeon: Lanis Fonda BRAVO, MD;  Location: [facility] OR;  Service: Vascular;  Laterality: Right;   APPLICATION OF WOUND VAC Right 10/01/2024   Procedure: APPLICATION, WOUND VAC;  Surgeon: Lanis Fonda BRAVO, MD;  Location: [facility] OR;  Service: Vascular;  Laterality: Right;   APPLICATION, SKIN SUBSTITUTE Right 09/27/2024   Procedure: APPLICATION, MYRIAD SKIN SUBSTITUTE;  Surgeon: Lanis Fonda BRAVO, MD;  Location: [facility] OR;  Service: Vascular;  Laterality: Right;   APPLICATION, SKIN SUBSTITUTE Right 10/01/2024   Procedure: APPLICATION, SKIN SUBSTITUTE KERECIS 95SQ CM;  Surgeon: Lanis Fonda BRAVO, MD;  Location: [facility] OR;  Service: Vascular;  Laterality: Right;   AXILLARY-FEMORAL BYPASS GRAFT Right 09/24/2024   Procedure: CREATION, BYPASS, ARTERIAL, AXILLARY TO BILATERAL FEMORAL, USING PROPATEN X 80CM X 2 GRAFT;  Surgeon: Serene Gaile ORN, MD;  Location: [facility] OR;  Service: Vascular;  Laterality: Right;   BACK SURGERY     CARDIAC CATHETERIZATION  03/10/2018   CATARACT EXTRACTION W/ INTRAOCULAR LENS IMPLANT Left    ENDARTERECTOMY FEMORAL Bilateral 09/24/2024   Procedure: BILATERAL FEMORAL ENDARTERECTOMY;  Surgeon: Serene Gaile ORN, MD;  Location: [facility] OR;  Service: Vascular;  Laterality: Bilateral;   EYE SURGERY     RETINAL SURGERY LEFT EYE   FASCIOTOMY Right 09/24/2024   Procedure: RIGHT LOWER LEG FOUR COMPARTMENT FASCIOTOMY WITH RESECTION OF TIBIALIS ANTERIOR;  Surgeon: Serene Gaile ORN, MD;  Location: [facility] OR;  Service: Vascular;  Laterality: Right;   FASCIOTOMY CLOSURE Right 09/27/2024   Procedure: RIGHT LOWER EXTREMITY FASCIOTOMY WASHOUT;  Surgeon: Lanis Fonda BRAVO, MD;  Location: [facility] OR;  Service: Vascular;   Laterality: Right;  RIGHT LEG CLOSURE   HYDROCELE EXCISION Left 03/05/2013   Procedure: HYDROCELECTOMY ADULT;  Surgeon: Donnice Gwenyth Brooks, MD;  Location: [facility];  Service: Urology;  Laterality: Left;   INCISION AND DRAINAGE OF DEEP ABSCESS, CALF Right 09/29/2024   Procedure: INCISION AND DRAINAGE OF DEEP ABSCESS, CALF;  Surgeon: Harden Jerona GAILS, MD;  Location: [facility] OR;  Service: Orthopedics;  Laterality: Right;   INCISION AND DRAINAGE OF DEEP ABSCESS, CALF Right 10/06/2024   Procedure: INCISION AND DRAINAGE OF DEEP ABSCESS, CALF RIGHT;  Surgeon: Harden Jerona GAILS, MD;  Location: [facility] OR;  Service: Orthopedics;  Laterality: Right;  DEBRIDEMENT RIGHT LEG   INCISION AND DRAINAGE OF WOUND Right 10/01/2024   Procedure: IRRIGATION AND DEBRIDEMENT WOUND;  Surgeon: Lanis Fonda BRAVO, MD;  Location: [facility] OR;  Service: Vascular;  Laterality: Right;   INCISIONAL HERNIA  REPAIR N/A 06/18/2022   Procedure: REPAIR OF INTERNAL HERNIA;  Surgeon: Rubin Calamity, MD;  Location: [facility] OR;  Service: General;  Laterality: N/A;   INGUINAL HERNIA REPAIR Bilateral AGE 60   INSERTION OF MESH N/A 06/11/2022   Procedure: INSERTION OF MESH;  Surgeon: Rubin Calamity, MD;  Location: [facility] OR;  Service: General;  Laterality: N/A;   INSERTION OF MESH N/A 06/18/2022   Procedure: INSERTION OF MESH;  Surgeon: Rubin Calamity, MD;  Location: [facility] OR;  Service: General;  Laterality: N/A;   KYPHOPLASTY N/A 05/08/2023   Procedure: THORACIC SEVEN KYPHOPLASTY;  Surgeon: Beuford Anes, MD;  Location: [facility] OR;  Service: Orthopedics;  Laterality: N/A;   LAPAROSCOPY N/A 06/18/2022   Procedure: LAPAROSCOPY DIAGNOSTIC;  Surgeon: Rubin Calamity, MD;  Location: [facility] OR;  Service: General;  Laterality: N/A;   LOWER EXTREMITY ANGIOGRAM Right 09/24/2024   Procedure: RIGHT LOWER EXTREMITY ANGIOGRAM AND RIGHT POPLETEAL THROMBECTOMY;  Surgeon: Serene Gaile ORN, MD;  Location: [facility];  Service: Vascular;  Laterality: Right;   LYMPHADENECTOMY  Bilateral 05/06/2013   Procedure: REDGIE;  Surgeon: Noretta Ferrara, MD;  Location: [facility] OR[facility];  Service: Urology;  Laterality: Bilateral;   PROSTATE BIOPSY N/A 03/05/2013   Procedure: PROSTATE BIOPSY and ultrasound;  Surgeon: Donnice Gwenyth Brooks, MD;  Location: [facility];  Service: Urology;  Laterality: N/A;   REMOVAL BURSA SAC, LEFT ELBOW  1996   ROBOT ASSISTED LAPAROSCOPIC RADICAL PROSTATECTOMY N/A 05/06/2013   Procedure: ROBOTIC ASSISTED LAPAROSCOPIC RADICAL PROSTATECTOMY LEVEL 3;  Surgeon: Noretta Ferrara, MD;  Location: [facility] OR[facility];  Service: Urology;  Laterality: N/A;   THROMBECTOMY FEMORAL ARTERY Bilateral 09/24/2024   Procedure: BILATERAL FEMORAL ARTERY THROMBECTOMY;  Surgeon: Serene Gaile ORN, MD;  Location: [facility];  Service: Vascular;  Laterality: Bilateral;   TONSILLECTOMY     as achild   TRANSFORAMINAL LUMBAR INTERBODY FUSION (TLIF) WITH PEDICLE SCREW FIXATION 1 LEVEL Left 09/08/2024   Procedure: LEFT-SIDED LUMBAR 4- LUMBAR 5 TRANSFORAMINAL LUMBAR INTERBODY FUSION AND DECOMPRESSION WITH INSTRUMENTATION AND ALLOGRAFT;  Surgeon: Beuford Anes, MD;  Location: [facility] OR;  Service: Orthopedics;  Laterality: Left;  LEFT-SIDED LUMBAR 4- LUMBAR 5 TRANSFORAMINAL LUMBAR INTERBODY FUSION AND DECOMPRESSION WITH INSTRUMENTATION AND ALLOGRAFT   XI ROBOTIC ASSISTED VENTRAL HERNIA N/A 06/11/2022   Procedure: ROBOTIC INCISIONAL HERNIA REPAIR WITH MESH;  Surgeon: Rubin Calamity, MD;  Location: [facility] OR;  Service: General;  Laterality: N/A;   Social History   Occupational History   Not on file  Tobacco Use   Smoking status: Former    Current packs/day: 0.00    Average packs/day: 2.0 packs/day for 47.0 years (94.0 ttl pk-yrs)    Types: Cigarettes    Start date: 12/31/1969    Quit date: 12/31/2016    Years since quitting: 7.9   Smokeless tobacco: Never   Tobacco comments:    Patient is currently smoke free since January 2018  Vaping Use   Vaping status: Never Used  Substance and Sexual  Activity   Alcohol use: Yes    Alcohol/week: 14.0 standard drinks of alcohol    Types: 14 Shots of liquor per week    Comment: couple of drinks a night   Drug use: No   Sexual activity: Never

## 2024-11-29 ENCOUNTER — Encounter: Payer: Self-pay | Admitting: Registered Nurse

## 2024-12-01 ENCOUNTER — Encounter: Payer: Self-pay | Admitting: Podiatry

## 2024-12-01 ENCOUNTER — Ambulatory Visit: Admitting: Podiatry

## 2024-12-01 DIAGNOSIS — B351 Tinea unguium: Secondary | ICD-10-CM

## 2024-12-01 DIAGNOSIS — M79675 Pain in left toe(s): Secondary | ICD-10-CM

## 2024-12-01 DIAGNOSIS — M79674 Pain in right toe(s): Secondary | ICD-10-CM

## 2024-12-01 DIAGNOSIS — I739 Peripheral vascular disease, unspecified: Secondary | ICD-10-CM

## 2024-12-05 ENCOUNTER — Encounter: Payer: Self-pay | Admitting: Podiatry

## 2024-12-05 NOTE — Progress Notes (Signed)
  Subjective:  Patient ID: David King, male    DOB: November 12, 1956,  MRN: [id number]  David King presents to clinic today for at risk foot care. Patient has h/o PAD and painful mycotic toenails of both feet that are difficult to trim. Pain interferes with daily activities and wearing enclosed shoe gear comfortably. He is followed by [facility] for PAD. Chief Complaint  Patient presents with   RFC    Rm16 Routine foot care/David King last visit Feb 10, 2024 back surgery 11 weeks ago and wound on right big toe.   New problem(s): None.   PCP is David David Redbird, MD. David King 08/02/24.  Allergies  Allergen Reactions   Ancef  [Cefazolin ] Anaphylaxis   Review of Systems: Negative except as noted in the HPI.  Objective:  There were no vitals filed for this visit. David King is a pleasant 68 y.o. male in NAD. AAO x 3.  Vascular Examination: Capillary refill time immediate b/l. Faintly palpable pedal pulses LLE. Dressing intact RLE. Pedal hair absent b/l. Pedal edema trace RLE. No pain with calf compression b/l. Skin temperature gradient WNL b/l. No cyanosis or clubbing b/l. No ischemia or gangrene noted b/l.   Neurological Examination: Sensation grossly intact b/l with 10 gram monofilament. Vibratory sensation intact b/l.   Dermatological Examination: Dressing noted RLE with heel pad. No interdigital macerations.   Toenails 1-5 b/l thick, discolored, elongated with subungual debris and pain on dorsal palpation.   No corns, calluses, nor porokeratotic lesions.  Musculoskeletal Examination: Wearing AFO on right lower extremity. Muscle strength 5/5 to all lower extremity muscle groups bilaterally. Utilizes walker for ambulation assistance.  Radiographs: None  Last A1c:      Latest Ref Rng & Units 09/09/2024    5:30 AM  Hemoglobin A1C  Hemoglobin-A1c 4.8 - 5.6 % 5.2    Assessment/Plan: 1. Pain due to onychomycosis of toenails of both feet   2. PAD (peripheral artery disease)    Patient was evaluated and treated. All patient's and/or POA's questions/concerns addressed on today's visit. Toenails 1-5 b/l debrided in length and girth without incident. Continue soft, supportive shoe gear daily. Report any pedal injuries to medical professional. Call office if there are any questions/concerns. -Patient/POA to call should there be question/concern in the interim.   Return in about 3 months (around 03/01/2025).  David King, DPM      [place] LOCATION: [address] [place] [address]                   Office [phone]   [facility] LOCATION: [address] [place], [place] [address] Office [phone]

## 2024-12-06 ENCOUNTER — Ambulatory Visit: Admitting: Physician Assistant

## 2024-12-06 ENCOUNTER — Ambulatory Visit (HOSPITAL_COMMUNITY)
Admission: RE | Admit: 2024-12-06 | Discharge: 2024-12-06 | Disposition: A | Source: Ambulatory Visit | Attending: Surgery | Admitting: Surgery

## 2024-12-06 VITALS — BP 119/66 | HR 74 | Temp 98.0°F | Wt 186.1 lb

## 2024-12-06 DIAGNOSIS — I739 Peripheral vascular disease, unspecified: Secondary | ICD-10-CM

## 2024-12-06 DIAGNOSIS — Z95828 Presence of other vascular implants and grafts: Secondary | ICD-10-CM

## 2024-12-06 DIAGNOSIS — I700 Atherosclerosis of aorta: Secondary | ICD-10-CM

## 2024-12-06 LAB — VAS US ABI WITH/WO TBI
Left ABI: 0.48
Right ABI: 0.63

## 2024-12-06 NOTE — Progress Notes (Unsigned)
 POST OPERATIVE OFFICE NOTE    CC:  F/u for surgery  HPI:  This is a 68 y.o. male who had prolonged hospitalization following an acute aortic occlusion with right axillary bifemoral bypass graft, bilateral common femoral, SFA and DFA thrombectomy, bilateral femoral endarterectomy, with 4 compartment right leg fasciotomy and resection of right anterior compartment by Dr. Serene on 09/24/24. He subsequently required take back to OR 4 times for debridement and 2 additional times by Dr. Harden for debridement of his lower leg wounds.   Pt returns today for follow up.  Pt states he is still having a lot of constant nerve pain. He is being transitioned from lyrica  to Gabapentin . Not seeing a lot of improvement yet. Says pain at times is 10/10. Occurs throughout entire legs in different areas. This is not worsened on ambulation. He feels he is slowly improving his walking. He is using walker. Still using foot drop brace. He and wife feel that his right heel wound and right leg wound are improving. These are managed by Dr. Harden. He has follow up in his office tomorrow 12/9. They are doing daily Vashe washes and using some Medihoney. No new wounds. He is getting PT at home and says he is doing his own exercises as well, which he feels is more helpful.   He is also having some limitation in abduction of his right arm and soreness in shoulder and in armpit. No issues with hand or arm otherwise. No numbness, pain or weakness. He also reports that his right flank is sore along the bypass graft and he can  feel the tube.  On Eliquis , Aspirin , and statin  Allergies  Allergen Reactions   Ancef  [Cefazolin ] Anaphylaxis    Current Outpatient Medications  Medication Sig Dispense Refill   acetaminophen  (TYLENOL ) 325 MG tablet Take 2 tablets (650 mg total) by mouth every 4 (four) hours as needed for mild pain (pain score 1-3) or fever (not to exceed 3000 mg acetaminophen  in 24 hours).     apixaban  (ELIQUIS ) 5 MG  TABS tablet Take 1 tablet (5 mg total) by mouth 2 (two) times daily. 60 tablet 0   ascorbic acid  (VITAMIN C ) 500 MG tablet Take 1 tablet (500 mg total) by mouth daily.     aspirin  EC 81 MG tablet Take 1 tablet (81 mg total) by mouth daily. Swallow whole.     cholecalciferol  (VITAMIN D3) 25 MCG (1000 UNIT) tablet Take 1 tablet (1,000 Units total) by mouth daily. 30 tablet 0   DULoxetine  (CYMBALTA ) 30 MG capsule Take 1 capsule (30 mg total) by mouth at bedtime. 30 capsule 0   gabapentin  (NEURONTIN ) 300 MG capsule 2 capsules Orally three times a day; Duration: 100 days or less as needed     midodrine  (PROAMATINE ) 10 MG tablet Take 1 tablet (10 mg total) by mouth 3 (three) times daily with meals. 90 tablet 0   Multiple Vitamin (MULTIVITAMIN WITH MINERALS) TABS tablet Take 1 tablet by mouth daily.     polyethylene glycol (MIRALAX  / GLYCOLAX ) 17 g packet Take 17 g by mouth daily.     rosuvastatin  (CRESTOR ) 20 MG tablet Take 1 tablet (20 mg total) by mouth daily. 30 tablet 0   budesonide -glycopyrrolate -formoterol  (BREZTRI ) 160-9-4.8 MCG/ACT AERO inhaler Inhale 2 puffs into the lungs 2 (two) times daily. (Patient not taking: Reported on 12/06/2024) 10.7 g 0   citalopram  (CELEXA ) 20 MG tablet Take 1 tablet (20 mg total) by mouth daily. (Patient not taking: Reported on  12/06/2024) 30 tablet 0   Nystatin (GERHARDT'S BUTT CREAM) CREA Apply 1 Application topically 2 (two) times daily. (Patient not taking: Reported on 12/06/2024)     senna-docusate (SENOKOT-S) 8.6-50 MG tablet Take 2 tablets by mouth at bedtime. (Patient not taking: Reported on 12/06/2024)     tamsulosin  (FLOMAX ) 0.4 MG CAPS capsule Take 1 capsule (0.4 mg total) by mouth daily after supper. (Patient not taking: Reported on 12/06/2024) 30 capsule 0   thiamine  (VITAMIN B1) 100 MG tablet Take 1 tablet (100 mg total) by mouth daily. (Patient not taking: Reported on 12/06/2024) 30 tablet 0   zolpidem  (AMBIEN ) 5 MG tablet Take 1 tablet (5 mg total) by mouth  at bedtime as needed for sleep. (Patient not taking: Reported on 12/06/2024) 30 tablet 0   No current facility-administered medications for this visit.     ROS:  See HPI  Physical Exam:  Vitals:   12/06/24 1425  BP: 119/66  Pulse: 74  Temp: 98 F (36.7 C)   Incision:  right axilla and bilateral groin incisions all healed Extremities:  Brisk PT/ DP signals bilaterally. Right leg and heel dressed.  Neuro: alert and oriented Abdomen:  soft  Non invasive Vascular lab: +-------+-----------+-----------+------------+------------+  ABI/TBIToday's ABIToday's TBIPrevious ABIPrevious TBI  +-------+-----------+-----------+------------+------------+  Right 0.63       0.35                                 +-------+-----------+-----------+------------+------------+  Left  0.48       0.29                                 +-------+-----------+-----------+------------+------------+   ABI's from [facility] 05/05/24: Right 1.08/ 0.84  Left 0.83/ 89   VAS US  Femoral-Femoral bypass graft: Right Graft #1: right axillary bifemoral bypass graft  +------------------+--------+--------+----------+--------+                   PSV cm/sStenosisWaveform  Comments  +------------------+--------+--------+----------+--------+  Inflow           117             monophasic          +------------------+--------+--------+----------+--------+  Prox Anastomosis  117             monophasic          +------------------+--------+--------+----------+--------+  Proximal Graft    147             monophasic          +------------------+--------+--------+----------+--------+  Mid Graft         122             monophasic          +------------------+--------+--------+----------+--------+  Distal Graft      96              monophasic          +------------------+--------+--------+----------+--------+  Distal Anastomosis83              monophasic           +------------------+--------+--------+----------+--------+  Outflow          61              biphasic            +------------------+--------+--------+----------+--------+   right axillary- right common femoral artery bypass  Left Graft #1: right femoral to left femoral bypass graft  +--------------------+--------+--------+----------+----------+                     PSV cm/sStenosisWaveform  Comments    +--------------------+--------+--------+----------+----------+  Inflow             22              monophasicretrograde  +--------------------+--------+--------+----------+----------+  Proximal Anastomosis83              monophasic            +--------------------+--------+--------+----------+----------+  Proximal Graft      40              monophasic            +--------------------+--------+--------+----------+----------+  Mid Graft           52              monophasic            +--------------------+--------+--------+----------+----------+  Distal Graft        43              monophasic            +--------------------+--------+--------+----------+----------+  Distal Anastomosis  40              monophasic            +--------------------+--------+--------+----------+----------+  Outflow            39              biphasic              +--------------------+--------+--------+----------+----------+   Summary:  Right: Patent right axillary bifemoral bypass graft. There is no evidence of stenosis.   Assessment/Plan:  This is a 68 y.o. male who prolonged hospitalization following an acute aortic occlusion with right axillary bifemoral bypass graft, bilateral common femoral, SFA and DFA thrombectomy, bilateral femoral endarterectomy, with 4 compartment right leg fasciotomy and resection of right anterior compartment by Dr. Serene on 09/24/24. He subsequently required take back to OR 4 times for debridement and 2 additional times by  Dr. Harden for debridement of his lower leg wounds. He is slowly progressing. His incisions are all healed aside from right lateral fasciotomy wound. He continues to have a lot of neuropathic pain.  - ABI is decreased compared to prior ABI performed at [facility] in May. However this was before his aortic occlusion. His ABI today is his new baseline post ax bifem - Duplex shows patent Ax fem and fem fem bypass grafts. Velocities in the bypass are relatively low - Hopefully he will continue to have improvement of nerve pain over time  - Patient showed me pictures of his right heel wound and right fasciotomy site. Bother are looking smaller and healthy. Eschar on right heel overall stable. Keep follow up with Dr. Harden - continue Eliquis , Aspirin  and statin  - Continue Exercise regimen/ working with PT -Follow up in 3 months with Ax-bifem bypass graft duplex and ABI   Curry Damme, [facility] [phone]   Clinic MD:  Serene

## 2024-12-07 ENCOUNTER — Encounter: Payer: Self-pay | Admitting: Physician Assistant

## 2024-12-08 ENCOUNTER — Ambulatory Visit: Admitting: Physician Assistant

## 2024-12-08 ENCOUNTER — Other Ambulatory Visit: Payer: Self-pay

## 2024-12-08 ENCOUNTER — Encounter: Payer: Self-pay | Admitting: Family

## 2024-12-08 DIAGNOSIS — Z9889 Other specified postprocedural states: Secondary | ICD-10-CM

## 2024-12-08 DIAGNOSIS — Z95828 Presence of other vascular implants and grafts: Secondary | ICD-10-CM

## 2024-12-08 DIAGNOSIS — L89610 Pressure ulcer of right heel, unstageable: Secondary | ICD-10-CM

## 2024-12-08 NOTE — Progress Notes (Signed)
 Office Visit Note   Patient: David King           Date of Birth: 04-15-1956           MRN: [id number] Visit Date: 12/08/2024              Requested by: [name], MD [address] [place],  [place] [address] PCP: [name], MD  Chief Complaint  Patient presents with   Right Leg - Routine Post Op    09/29/24 and 10/06/24 I&D right calf      HPI: The patient is a 68 year old gentleman who presents status post repeat debridements of the right lower extremity as well as decubitus ulcer of the right heel he has been doing Vashe to dry dressing changes to the wounds on the right leg dry dressings to the decubitus ulcer of the heel Home health is following and assisting with his wound care  Assessment & Plan: Visit Diagnoses: No diagnosis found.  Plan: Heel decubitus ulcer debridement today patient tolerated well they will begin using Iodosorb to the heel continue with Vashe to dry dressings on the leg  Follow-Up Instructions: No follow-ups on file.   Ortho Exam  Patient is alert, oriented, no adenopathy, well-dressed, normal affect, normal respiratory effort. On examination right lower extremity the anterior leg ulcer continues to improve there is significant epithelialization laterally today this measures 11 x 4 cm with about 4 mm of depth over the medial border there is scant fibrinous exudative tissue medially.  There is no surrounding erythema or purulence   examination left heel decubitus ulcer this is unroofing this was debrided with a 10 blade knife after informed consent of nonviable tissue there is fibrinous exudative tissue and some necrotic tissue in the wound bed this remains about 2-1/2 cm in diameter will begin using Iodosorb    Imaging: No results found. No images are attached to the encounter.  Labs: Lab Results  Component Value Date   HGBA1C 5.2 09/09/2024   HGBA1C 5.3 06/17/2022   CRP 6.4 (H) 10/02/2024   REPTSTATUS 10/14/2024 FINAL  10/13/2024   GRAMSTAIN NO WBC SEEN NO ORGANISMS SEEN  10/06/2024   CULT  10/13/2024    NO GROWTH Performed at [facility], [address], [place], [place] [address]      Lab Results  Component Value Date   ALBUMIN  3.1 (L) 11/04/2024   ALBUMIN  2.9 (L) 11/01/2024   ALBUMIN  2.9 (L) 10/29/2024    Lab Results  Component Value Date   MG 2.2 10/07/2024   MG 2.1 09/28/2024   MG 2.3 09/27/2024   Lab Results  Component Value Date   VD25OH 51.12 10/02/2024    No results found for: PREALBUMIN    Latest Ref Rng & Units 11/04/2024    5:00 AM 11/01/2024    3:02 AM 10/29/2024    2:39 PM  CBC EXTENDED  WBC 4.0 - 10.5 K/uL 6.0  5.8  8.5   RBC 4.22 - 5.81 MIL/uL 3.57  3.48  3.48   Hemoglobin 13.0 - 17.0 g/dL 89.2  89.6  89.4   HCT 39.0 - 52.0 % 33.8  33.7  33.2   Platelets 150 - 400 K/uL 170  165  243   NEUT# 1.7 - 7.7 K/uL 3.7  3.5  5.9   Lymph# 0.7 - 4.0 K/uL 1.3  1.4  1.4      There is no height or weight on file to calculate BMI.  Orders:  No orders of the defined types were placed in this encounter.  No orders of the defined types were placed in this encounter.    Procedures: No procedures performed  Clinical Data: No additional findings.  ROS:  All other systems negative, except as noted in the HPI. Review of Systems  Objective: Vital Signs: There were no vitals taken for this visit.  Specialty Comments:  No specialty comments available.  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 02/20/2022   Degeneration, intervertebral disc, lumbar 09/10/2021   Osteoarthritis of spine with radiculopathy, lumbar region 09/10/2021   Low back pain 06/27/2021   Gastroesophageal reflux disease 06/15/2021   Hardening of the aorta (main artery of the heart) 06/15/2021   History of malignant neoplasm of prostate 06/15/2021   Insomnia 06/15/2021   Vitamin D  deficiency 06/15/2021   Essential hypertension 01/27/2020   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   Former smoker 08/05/2019   Pulmonary emphysema (HCC) 08/05/2019   Coronary artery disease involving native coronary artery of native heart without angina pectoris 03/06/2018   Unstable angina (HCC) 02/24/2018   Spinal stenosis of lumbar region 10/07/2013   Prostate cancer [facility])    Past Medical History:  Diagnosis Date   Anxiety    new dx   Arthritis    lumbar   Atherosclerotic vascular disease    Cataract    COPD (chronic obstructive pulmonary disease) (HCC)    Diverticulosis    Elevated PSA    Headache(784.0)    MIGRAINES   Hypertension 2021   Iritis    CHRONIC IN LEFT EYE - SOME VISIAL IMPAIRMENT IN LEFT EYE   Neuromuscular disorder (HCC)    left leg/foot,pinched siactic nerve   Pain    PAIN LEFT HIP AND DOWN LT LEG WITH NUMBNESS IN LEFT LEG--PT STATES SCIATIC NERVE IMPINGEMENT - PT PLANS BACK IN THE NEAR SURGERY.   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   Renal cysts, acquired, bilateral 03/19/2013   several simple , CT   Urinary frequency    AND NOCTURIA    Family History  Problem Relation Age of Onset   Cancer Father        prostate cancer   Cancer Paternal Grandfather        prostate   Colon cancer Neg Hx    Colon polyps Neg Hx    Esophageal cancer Neg Hx    Rectal cancer Neg Hx    Stomach cancer Neg Hx     Past Surgical History:  Procedure Laterality Date   ABDOMINAL AORTOGRAM W/LOWER EXTREMITY N/A 09/21/2024    Procedure: ABDOMINAL AORTOGRAM W/LOWER EXTREMITY;  Surgeon: Serene Gaile ORN, MD;  [facility];  Service: Cardiovascular;  Laterality: N/A;   APPLICATION OF WOUND VAC Right 09/27/2024   Procedure: APPLICATION, WOUND VAC RIGHT LOWER EXTREMITY;  Surgeon: Lanis Fonda BRAVO, MD;  Location: [facility] OR;  Service: Vascular;  Laterality: Right;   APPLICATION OF WOUND VAC Right 10/01/2024   Procedure: APPLICATION, WOUND VAC;  Surgeon: Lanis Fonda BRAVO, MD;  Location: [facility] OR;  Service: Vascular;  Laterality: Right;   APPLICATION, SKIN SUBSTITUTE Right 09/27/2024   Procedure: APPLICATION, MYRIAD SKIN SUBSTITUTE;  Surgeon: Lanis Fonda BRAVO, MD;  Location: [facility] OR;  Service: Vascular;  Laterality: Right;   APPLICATION, SKIN SUBSTITUTE Right 10/01/2024   Procedure: APPLICATION, SKIN SUBSTITUTE KERECIS 95SQ CM;  Surgeon: Lanis Fonda BRAVO, MD;  [facility];  Service: Vascular;  Laterality: Right;   AXILLARY-FEMORAL BYPASS GRAFT Right 09/24/2024   Procedure: CREATION, BYPASS, ARTERIAL, AXILLARY TO BILATERAL FEMORAL, USING PROPATEN X 80CM X 2 GRAFT;  Surgeon: Serene Gaile ORN, MD;  [facility];  Service: Vascular;  Laterality: Right;   BACK SURGERY     CARDIAC CATHETERIZATION  03/10/2018   CATARACT EXTRACTION W/ INTRAOCULAR LENS IMPLANT Left    ENDARTERECTOMY FEMORAL Bilateral 09/24/2024   Procedure: BILATERAL FEMORAL ENDARTERECTOMY;  Surgeon: Serene Gaile ORN, MD;  [facility];  Service: Vascular;  Laterality: Bilateral;   EYE SURGERY     RETINAL SURGERY LEFT EYE   FASCIOTOMY Right 09/24/2024   Procedure: RIGHT LOWER LEG FOUR COMPARTMENT FASCIOTOMY WITH RESECTION OF TIBIALIS ANTERIOR;  Surgeon: Serene Gaile ORN, MD;  [facility];  Service: Vascular;  Laterality: Right;   FASCIOTOMY CLOSURE Right 09/27/2024   Procedure: RIGHT LOWER EXTREMITY FASCIOTOMY WASHOUT;  Surgeon: Lanis Fonda BRAVO, MD;  Location: [facility] OR;  Service: Vascular;  Laterality: Right;  RIGHT LEG CLOSURE   HYDROCELE EXCISION  Left 03/05/2013   Procedure: HYDROCELECTOMY ADULT;  Surgeon: Donnice Gwenyth Brooks, MD;  Location: [facility];  Service: Urology;  Laterality: Left;   INCISION AND DRAINAGE OF DEEP ABSCESS, CALF Right 09/29/2024   Procedure: INCISION AND DRAINAGE OF DEEP ABSCESS, CALF;  Surgeon: Harden Jerona GAILS, MD;  [facility];  Service: Orthopedics;  Laterality: Right;   INCISION AND DRAINAGE OF DEEP ABSCESS, CALF Right 10/06/2024   Procedure: INCISION AND DRAINAGE OF DEEP ABSCESS, CALF RIGHT;  Surgeon: Harden Jerona GAILS, MD;  [facility];  Service: Orthopedics;  Laterality: Right;  DEBRIDEMENT RIGHT LEG   INCISION AND DRAINAGE OF WOUND Right 10/01/2024   Procedure: IRRIGATION AND DEBRIDEMENT WOUND;  Surgeon: Lanis Fonda BRAVO, MD;  Location: [facility] OR;  Service: Vascular;  Laterality: Right;   INCISIONAL HERNIA REPAIR N/A 06/18/2022   Procedure: REPAIR OF INTERNAL HERNIA;  Surgeon: Rubin Calamity, MD;  Location: [facility] OR;  Service: General;  Laterality: N/A;   INGUINAL HERNIA REPAIR Bilateral AGE 41   INSERTION OF MESH N/A 06/11/2022   Procedure: INSERTION OF MESH;  Surgeon: Rubin Calamity, MD;  Location: [facility] OR;  Service: General;  Laterality: N/A;   INSERTION OF MESH N/A 06/18/2022   Procedure: INSERTION OF MESH;  Surgeon: Rubin Calamity, MD;  Location: [facility] OR;  Service: General;  Laterality: N/A;   KYPHOPLASTY N/A 05/08/2023   Procedure: THORACIC SEVEN KYPHOPLASTY;  Surgeon: Beuford Anes, MD;  [facility];  Service: Orthopedics;  Laterality: N/A;   LAPAROSCOPY N/A 06/18/2022   Procedure: LAPAROSCOPY DIAGNOSTIC;  Surgeon: Rubin Calamity, MD;  Location: [facility] OR;  Service: General;  Laterality: N/A;   LOWER EXTREMITY ANGIOGRAM Right 09/24/2024   Procedure: RIGHT LOWER EXTREMITY ANGIOGRAM AND RIGHT POPLETEAL THROMBECTOMY;  Surgeon: Serene Gaile ORN, MD;  [facility];  Service: Vascular;  Laterality: Right;   LYMPHADENECTOMY Bilateral 05/06/2013   Procedure: REDGIE;  Surgeon:  Noretta Ferrara, MD;  Location: [facility];  Service: Urology;  Laterality: Bilateral;   PROSTATE BIOPSY N/A 03/05/2013  Procedure: PROSTATE BIOPSY and ultrasound;  Surgeon: Donnice Gwenyth Brooks, MD;  Location: [facility];  Service: Urology;  Laterality: N/A;   REMOVAL BURSA SAC, LEFT ELBOW  1996   ROBOT ASSISTED LAPAROSCOPIC RADICAL PROSTATECTOMY N/A 05/06/2013   Procedure: ROBOTIC ASSISTED LAPAROSCOPIC RADICAL PROSTATECTOMY LEVEL 3;  Surgeon: Noretta Ferrara, MD;  [facility];  Service: Urology;  Laterality: N/A;   THROMBECTOMY FEMORAL ARTERY Bilateral 09/24/2024   Procedure: BILATERAL FEMORAL ARTERY THROMBECTOMY;  Surgeon: Serene Gaile ORN, MD;  [facility];  Service: Vascular;  Laterality: Bilateral;   TONSILLECTOMY     as achild   TRANSFORAMINAL LUMBAR INTERBODY FUSION (TLIF) WITH PEDICLE SCREW FIXATION 1 LEVEL Left 09/08/2024   Procedure: LEFT-SIDED LUMBAR 4- LUMBAR 5 TRANSFORAMINAL LUMBAR INTERBODY FUSION AND DECOMPRESSION WITH INSTRUMENTATION AND ALLOGRAFT;  Surgeon: Beuford Anes, MD;  [facility];  Service: Orthopedics;  Laterality: Left;  LEFT-SIDED LUMBAR 4- LUMBAR 5 TRANSFORAMINAL LUMBAR INTERBODY FUSION AND DECOMPRESSION WITH INSTRUMENTATION AND ALLOGRAFT   XI ROBOTIC ASSISTED VENTRAL HERNIA N/A 06/11/2022   Procedure: ROBOTIC INCISIONAL HERNIA REPAIR WITH MESH;  Surgeon: Rubin Calamity, MD;  Location: [facility] OR;  Service: General;  Laterality: N/A;   Social History   Occupational History   Not on file  Tobacco Use   Smoking status: Former    Current packs/day: 0.00    Average packs/day: 2.0 packs/day for 47.0 years (94.0 ttl pk-yrs)    Types: Cigarettes    Start date: 12/31/1969    Quit date: 12/31/2016    Years since quitting: 7.9   Smokeless tobacco: Never   Tobacco comments:    Patient is currently smoke free since January 2018  Vaping Use   Vaping status: Never Used  Substance and Sexual Activity   Alcohol use: Not Currently    Alcohol/week: 14.0  standard drinks of alcohol    Types: 14 Shots of liquor per week    Comment: couple of drinks a night   Drug use: No   Sexual activity: Never

## 2024-12-29 ENCOUNTER — Ambulatory Visit: Admitting: Family

## 2024-12-29 DIAGNOSIS — E43 Unspecified severe protein-calorie malnutrition: Secondary | ICD-10-CM

## 2024-12-29 DIAGNOSIS — Z9889 Other specified postprocedural states: Secondary | ICD-10-CM

## 2024-12-29 DIAGNOSIS — L89610 Pressure ulcer of right heel, unstageable: Secondary | ICD-10-CM

## 2024-12-29 NOTE — Progress Notes (Signed)
 "  Office Visit Note   Patient: David King           Date of Birth: Nov 05, 1956           MRN: [id number] Visit Date: 12/29/2024              Requested by: [name], MD [address] [place],  [place] [address] PCP: [name], MD  Chief Complaint  Patient presents with   Right Leg - Routine Post Op    09/29/24 and 10/06/24 I&D right calf      HPI: The patient is a 68 year old gentleman who presents status post repeat debridement right lower extremity as well as decubitus ulcer of the right heel.  Been doing Vashe to dry dressings to the leg ulcers using Iodosorb on the heel.  No new concerns voiced.  Assessment & Plan: Visit Diagnoses: No diagnosis found.  Plan: Will continue with current wound care.  Pleased with significant improvement.  Follow-up in 3 weeks.  Follow-Up Instructions: No follow-ups on file.   Ortho Exam  Patient is alert, oriented, no adenopathy, well-dressed, normal affect, normal respiratory effort. On examination right lower extremity the anterior leg ulcer shows marked improvement since he was last seen.  Today this ulcer measures 6 cm x 2 cm without depth filled in with full granulation.  On examination right heel there is a posterior decubitus ulcer which shows eschar which is unroofing circumferentially this was debrided with a 10 blade knife after informed consent.  Patient tolerated well.  There was no bleeding  About 50% fibrinous exudative tissue.   Imaging: No results found. No images are attached to the encounter.  Labs: Lab Results  Component Value Date   HGBA1C 5.2 09/09/2024   HGBA1C 5.3 06/17/2022   CRP 6.4 (H) 10/02/2024   REPTSTATUS 10/14/2024 FINAL 10/13/2024   GRAMSTAIN NO WBC SEEN NO ORGANISMS SEEN  10/06/2024   CULT  10/13/2024    NO GROWTH Performed at [facility], [address], [place], [place] [address]      Lab Results  Component Value Date   ALBUMIN  3.1 (L) 11/04/2024   ALBUMIN   2.9 (L) 11/01/2024   ALBUMIN  2.9 (L) 10/29/2024    Lab Results  Component Value Date   MG 2.2 10/07/2024   MG 2.1 09/28/2024   MG 2.3 09/27/2024   Lab Results  Component Value Date   VD25OH 51.12 10/02/2024    No results found for: PREALBUMIN    Latest Ref Rng & Units 11/04/2024    5:00 AM 11/01/2024    3:02 AM 10/29/2024    2:39 PM  CBC EXTENDED  WBC 4.0 - 10.5 K/uL 6.0  5.8  8.5   RBC 4.22 - 5.81 MIL/uL 3.57  3.48  3.48   Hemoglobin 13.0 - 17.0 g/dL 89.2  89.6  89.4   HCT 39.0 - 52.0 % 33.8  33.7  33.2   Platelets 150 - 400 K/uL 170  165  243   NEUT# 1.7 - 7.7 K/uL 3.7  3.5  5.9   Lymph# 0.7 - 4.0 K/uL 1.3  1.4  1.4      There is no height or weight on file to calculate BMI.  Orders:  No orders of the defined types were placed in this encounter.  No orders of the defined types were placed in this encounter.    Procedures: No procedures performed  Clinical Data: No additional findings.  ROS:  All other  systems negative, except as noted in the HPI. Review of Systems  Objective: Vital Signs: There were no vitals taken for this visit.  Specialty Comments:  No specialty comments available.  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 02/20/2022   Degeneration, intervertebral disc, lumbar 09/10/2021   Osteoarthritis of spine with radiculopathy, lumbar region 09/10/2021   Low back pain 06/27/2021   Gastroesophageal reflux disease 06/15/2021    Hardening of the aorta (main artery of the heart) 06/15/2021   History of malignant neoplasm of prostate 06/15/2021   Insomnia 06/15/2021   Vitamin D  deficiency 06/15/2021   Essential hypertension 01/27/2020   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   Former smoker 08/05/2019   Pulmonary emphysema (HCC) 08/05/2019   Coronary artery disease involving native coronary artery of native heart without angina pectoris 03/06/2018   Unstable angina (HCC) 02/24/2018   Spinal stenosis of lumbar region 10/07/2013   Prostate cancer [facility])    Past Medical History:  Diagnosis Date   Anxiety    new dx   Arthritis    lumbar   Atherosclerotic vascular disease    Cataract    COPD (chronic obstructive pulmonary disease) (HCC)    Diverticulosis    Elevated PSA    Headache(784.0)    MIGRAINES   Hypertension 2021   Iritis    CHRONIC IN LEFT EYE - SOME VISIAL IMPAIRMENT IN LEFT EYE   Neuromuscular disorder (HCC)    left leg/foot,pinched siactic nerve   Pain    PAIN LEFT HIP AND DOWN LT LEG WITH NUMBNESS IN LEFT LEG--PT STATES SCIATIC NERVE IMPINGEMENT - PT PLANS BACK IN THE NEAR SURGERY.   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   Renal cysts, acquired, bilateral 03/19/2013   several simple , CT   Urinary frequency    AND NOCTURIA    Family History  Problem Relation Age of Onset   Cancer Father        prostate cancer   Cancer Paternal Grandfather        prostate   Colon cancer Neg Hx    Colon polyps Neg Hx    Esophageal cancer Neg Hx    Rectal cancer Neg Hx    Stomach cancer Neg Hx     Past Surgical History:  Procedure Laterality Date   ABDOMINAL AORTOGRAM W/LOWER EXTREMITY N/A 09/21/2024   Procedure: ABDOMINAL AORTOGRAM W/LOWER EXTREMITY;  Surgeon: Serene Gaile ORN, MD;  [facility];  Service: Cardiovascular;  Laterality: N/A;   APPLICATION OF WOUND VAC Right 09/27/2024   Procedure: APPLICATION, WOUND VAC RIGHT LOWER EXTREMITY;   Surgeon: Lanis Fonda BRAVO, MD;  Location: [facility] OR;  Service: Vascular;  Laterality: Right;   APPLICATION OF WOUND VAC Right 10/01/2024   Procedure: APPLICATION, WOUND VAC;  Surgeon: Lanis Fonda BRAVO, MD;  Location: [facility] OR;  Service: Vascular;  Laterality: Right;   APPLICATION, SKIN SUBSTITUTE Right 09/27/2024   Procedure: APPLICATION, MYRIAD SKIN SUBSTITUTE;  Surgeon: Lanis Fonda BRAVO, MD;  Location: [facility] OR;  Service: Vascular;  Laterality: Right;   APPLICATION, SKIN SUBSTITUTE Right 10/01/2024   Procedure: APPLICATION, SKIN SUBSTITUTE KERECIS 95SQ CM;  Surgeon: Lanis Fonda BRAVO, MD;  [facility];  Service: Vascular;  Laterality: Right;   AXILLARY-FEMORAL BYPASS GRAFT Right 09/24/2024   Procedure: CREATION, BYPASS, ARTERIAL, AXILLARY TO BILATERAL FEMORAL, USING PROPATEN X 80CM X 2 GRAFT;  Surgeon: Serene Gaile ORN, MD;  [facility];  Service: Vascular;  Laterality: Right;   BACK SURGERY     CARDIAC CATHETERIZATION  03/10/2018   CATARACT EXTRACTION W/ INTRAOCULAR LENS IMPLANT Left    ENDARTERECTOMY FEMORAL Bilateral 09/24/2024   Procedure: BILATERAL FEMORAL ENDARTERECTOMY;  Surgeon: Serene Gaile ORN, MD;  [facility];  Service: Vascular;  Laterality: Bilateral;   EYE SURGERY     RETINAL SURGERY LEFT EYE   FASCIOTOMY Right 09/24/2024   Procedure: RIGHT LOWER LEG FOUR COMPARTMENT FASCIOTOMY WITH RESECTION OF TIBIALIS ANTERIOR;  Surgeon: Serene Gaile ORN, MD;  [facility];  Service: Vascular;  Laterality: Right;   FASCIOTOMY CLOSURE Right 09/27/2024   Procedure: RIGHT LOWER EXTREMITY FASCIOTOMY WASHOUT;  Surgeon: Lanis Fonda BRAVO, MD;  Location: [facility] OR;  Service: Vascular;  Laterality: Right;  RIGHT LEG CLOSURE   HYDROCELE EXCISION Left 03/05/2013   Procedure: HYDROCELECTOMY ADULT;  Surgeon: Donnice Gwenyth Brooks, MD;  Location: [facility];  Service: Urology;  Laterality: Left;   INCISION AND DRAINAGE OF DEEP ABSCESS, CALF Right 09/29/2024   Procedure: INCISION AND  DRAINAGE OF DEEP ABSCESS, CALF;  Surgeon: Harden Jerona GAILS, MD;  [facility];  Service: Orthopedics;  Laterality: Right;   INCISION AND DRAINAGE OF DEEP ABSCESS, CALF Right 10/06/2024   Procedure: INCISION AND DRAINAGE OF DEEP ABSCESS, CALF RIGHT;  Surgeon: Harden Jerona GAILS, MD;  [facility];  Service: Orthopedics;  Laterality: Right;  DEBRIDEMENT RIGHT LEG   INCISION AND DRAINAGE OF WOUND Right 10/01/2024   Procedure: IRRIGATION AND DEBRIDEMENT WOUND;  Surgeon: Lanis Fonda BRAVO, MD;  Location: [facility] OR;  Service: Vascular;  Laterality: Right;   INCISIONAL HERNIA REPAIR N/A 06/18/2022   Procedure: REPAIR OF INTERNAL HERNIA;  Surgeon: Rubin Calamity, MD;  Location: [facility] OR;  Service: General;  Laterality: N/A;   INGUINAL HERNIA REPAIR Bilateral AGE 40   INSERTION OF MESH N/A 06/11/2022   Procedure: INSERTION OF MESH;  Surgeon: Rubin Calamity, MD;  Location: [facility] OR;  Service: General;  Laterality: N/A;   INSERTION OF MESH N/A 06/18/2022   Procedure: INSERTION OF MESH;  Surgeon: Rubin Calamity, MD;  Location: [facility] OR;  Service: General;  Laterality: N/A;   KYPHOPLASTY N/A 05/08/2023   Procedure: THORACIC SEVEN KYPHOPLASTY;  Surgeon: Beuford Anes, MD;  [facility];  Service: Orthopedics;  Laterality: N/A;   LAPAROSCOPY N/A 06/18/2022   Procedure: LAPAROSCOPY DIAGNOSTIC;  Surgeon: Rubin Calamity, MD;  Location: [facility] OR;  Service: General;  Laterality: N/A;   LOWER EXTREMITY ANGIOGRAM Right 09/24/2024   Procedure: RIGHT LOWER EXTREMITY ANGIOGRAM AND RIGHT POPLETEAL THROMBECTOMY;  Surgeon: Serene Gaile ORN, MD;  [facility];  Service: Vascular;  Laterality: Right;   LYMPHADENECTOMY Bilateral 05/06/2013   Procedure: REDGIE;  Surgeon: Noretta Ferrara, MD;  [facility];  Service: Urology;  Laterality: Bilateral;   PROSTATE BIOPSY N/A 03/05/2013   Procedure: PROSTATE BIOPSY and ultrasound;  Surgeon: Donnice Gwenyth Brooks, MD;  Location: [facility];  Service: Urology;   Laterality: N/A;   REMOVAL BURSA SAC, LEFT ELBOW  1996   ROBOT ASSISTED LAPAROSCOPIC RADICAL PROSTATECTOMY N/A 05/06/2013  Procedure: ROBOTIC ASSISTED LAPAROSCOPIC RADICAL PROSTATECTOMY LEVEL 3;  Surgeon: Noretta Ferrara, MD;  [facility];  Service: Urology;  Laterality: N/A;   THROMBECTOMY FEMORAL ARTERY Bilateral 09/24/2024   Procedure: BILATERAL FEMORAL ARTERY THROMBECTOMY;  Surgeon: Serene Gaile ORN, MD;  [facility];  Service: Vascular;  Laterality: Bilateral;   TONSILLECTOMY     as achild   TRANSFORAMINAL LUMBAR INTERBODY FUSION (TLIF) WITH PEDICLE SCREW FIXATION 1 LEVEL Left 09/08/2024   Procedure: LEFT-SIDED LUMBAR 4- LUMBAR 5 TRANSFORAMINAL LUMBAR INTERBODY FUSION AND DECOMPRESSION WITH INSTRUMENTATION AND ALLOGRAFT;  Surgeon: Beuford Anes, MD;  [facility];  Service: Orthopedics;  Laterality: Left;  LEFT-SIDED LUMBAR 4- LUMBAR 5 TRANSFORAMINAL LUMBAR INTERBODY FUSION AND DECOMPRESSION WITH INSTRUMENTATION AND ALLOGRAFT   XI ROBOTIC ASSISTED VENTRAL HERNIA N/A 06/11/2022   Procedure: ROBOTIC INCISIONAL HERNIA REPAIR WITH MESH;  Surgeon: Rubin Calamity, MD;  Location: [facility] OR;  Service: General;  Laterality: N/A;   Social History   Occupational History   Not on file  Tobacco Use   Smoking status: Former    Current packs/day: 0.00    Average packs/day: 2.0 packs/day for 47.0 years (94.0 ttl pk-yrs)    Types: Cigarettes    Start date: 12/31/1969    Quit date: 12/31/2016    Years since quitting: 8.0   Smokeless tobacco: Never   Tobacco comments:    Patient is currently smoke free since January 2018  Vaping Use   Vaping status: Never Used  Substance and Sexual Activity   Alcohol use: Not Currently    Alcohol/week: 14.0 standard drinks of alcohol    Types: 14 Shots of liquor per week    Comment: couple of drinks a night   Drug use: No   Sexual activity: Never       "

## 2025-01-03 ENCOUNTER — Encounter: Attending: Registered Nurse | Admitting: Physical Medicine and Rehabilitation

## 2025-01-03 ENCOUNTER — Encounter: Payer: Self-pay | Admitting: Physical Medicine and Rehabilitation

## 2025-01-03 VITALS — BP 165/74 | HR 74 | Ht 69.0 in | Wt 185.6 lb

## 2025-01-03 DIAGNOSIS — I739 Peripheral vascular disease, unspecified: Secondary | ICD-10-CM | POA: Diagnosis not present

## 2025-01-03 DIAGNOSIS — M5116 Intervertebral disc disorders with radiculopathy, lumbar region: Secondary | ICD-10-CM | POA: Insufficient documentation

## 2025-01-03 DIAGNOSIS — M21371 Foot drop, right foot: Secondary | ICD-10-CM | POA: Diagnosis not present

## 2025-01-03 DIAGNOSIS — T79A21D Traumatic compartment syndrome of right lower extremity, subsequent encounter: Secondary | ICD-10-CM | POA: Insufficient documentation

## 2025-01-03 DIAGNOSIS — M48062 Spinal stenosis, lumbar region with neurogenic claudication: Secondary | ICD-10-CM | POA: Insufficient documentation

## 2025-01-03 DIAGNOSIS — M792 Neuralgia and neuritis, unspecified: Secondary | ICD-10-CM | POA: Diagnosis not present

## 2025-01-03 MED ORDER — DULOXETINE HCL 60 MG PO CPEP: 60.0000 mg | ORAL_CAPSULE | Freq: Every day | ORAL | 3 refills | Status: AC

## 2025-01-03 NOTE — Progress Notes (Signed)
 "  Subjective:    Patient ID: David King, male    DOB: 01/27/1956, 69 y.o.   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   Nerve pain just won't stop-  Gabapentin  600 mg TID-  Duloxetine  30 mg at bedtime.  Just not touching it.   Pain 6/10- constant and gets shooting pains that get up to 9/10    Problems with R AFO-  cannot straighten leg- R knee -cannot walk right.   R medial malleolus and hitting side of foot- and cannot tighten at knee, because feels like knives.  No skin breakdown- but painful there.   RLE pain didn't occur until AFTER back surgery.  The nerve pain-   And had blockage causing RLE ischemia wasn't hurting until after came to rehab.    Had PA debride R heel pressure ulcer last week- looks much better per pt.  Has pic of before debridement.  Has healed to half seized of pic- idosorb- using nightly.   R leg healing great-    Walking 100-150 ft max- then gets fatigued-  Still in H/H PT- just PT. Done with OT.      Pain Inventory Average Pain 6 Pain Right Now 6 My pain is intermittent, constant, sharp, burning, stabbing, and tingling  In the last 24 hours, has pain interfered with the following? General activity 7 Relation with others 0 Enjoyment of life 3 What TIME of day is your pain at its worst? daytime, evening, and night Sleep (in general) Fair  Pain is worse with: unsure Pain improves with: rest Relief from Meds: 3  Family History  Problem Relation Age of Onset   Cancer Father        prostate cancer   Cancer Paternal Grandfather         prostate   Colon cancer Neg Hx    Colon polyps Neg Hx    Esophageal cancer Neg Hx    Rectal cancer Neg Hx    Stomach cancer Neg Hx    Social History   Socioeconomic History   Marital status: Married    Spouse name: Not on file   Number of children: Not on file   Years of education: Not on file   Highest education level: Not on file  Occupational History   Not on file  Tobacco Use   Smoking status: Former    Current packs/day: 0.00    Average packs/day: 2.0 packs/day for 47.0 years (94.0 ttl pk-yrs)    Types: Cigarettes    Start date: 12/31/1969    Quit date: 12/31/2016    Years since quitting: 8.0   Smokeless tobacco: Never   Tobacco comments:    Patient is currently smoke free since January 2018  Vaping Use   Vaping status: Never Used  Substance and Sexual Activity   Alcohol use: Not Currently    Alcohol/week: 14.0 standard drinks of alcohol    Types: 14 Shots of liquor per week    Comment: couple of drinks a night   Drug use: No   Sexual activity: Never  Other Topics Concern  Not on file  Social History Narrative   Not on file   Social Drivers of Health   Tobacco Use: Medium Risk (01/03/2025)   Patient History    Smoking Tobacco Use: Former    Smokeless Tobacco Use: Never    Passive Exposure: Not on file  Financial Resource Strain: Low Risk (05/05/2023)   Received from [facility]   Overall Financial Resource Strain (CARDIA)    Difficulty of Paying Living Expenses: Not hard at all  Food Insecurity: Patient Declined (09/29/2024)   Epic    Worried About Programme Researcher, Broadcasting/film/video in the Last Year: Patient declined    Barista in the Last Year: Patient declined  Transportation Needs: Patient Declined (09/29/2024)   Epic    Lack of Transportation (Medical): Patient declined    Lack of Transportation (Non-Medical): Patient declined  Physical Activity: Unknown (05/05/2023)   Received from [facility]   Exercise Vital Sign    On average, how many days per  week do you engage in moderate to strenuous exercise (like a brisk walk)?: 0 days    Minutes of Exercise per Session: Not on file  Stress: No Stress Concern Present (05/05/2023)   Received from [facility] - Occupational Stress Questionnaire    Feeling of Stress : Only a little  Social Connections: Unknown (09/29/2024)   Social Connection and Isolation Panel    Frequency of Communication with Friends and Family: Patient declined    Frequency of Social Gatherings with Friends and Family: Patient unable to answer    Attends Religious Services: Patient declined    Active Member of Clubs or Organizations: Patient declined    Attends Banker Meetings: Patient declined    Marital Status: Patient declined  Depression (PHQ2-9): Low Risk (11/24/2024)   Depression (PHQ2-9)    PHQ-2 Score: 4  Alcohol Screen: Not on file  Housing: Unknown (09/29/2024)   Epic    Unable to Pay for Housing in the Last Year: Patient declined    Number of Times Moved in the Last Year: 0    Homeless in the Last Year: Patient declined  Utilities: Patient Declined (09/29/2024)   Epic    Threatened with loss of utilities: Patient declined  Health Literacy: Not on file   Past Surgical History:  Procedure Laterality Date   ABDOMINAL AORTOGRAM W/LOWER EXTREMITY N/A 09/21/2024   Procedure: ABDOMINAL AORTOGRAM W/LOWER EXTREMITY;  Surgeon: Serene Gaile ORN, MD;  [facility];  Service: Cardiovascular;  Laterality: N/A;   APPLICATION OF WOUND VAC Right 09/27/2024   Procedure: APPLICATION, WOUND VAC RIGHT LOWER EXTREMITY;  Surgeon: Lanis Fonda BRAVO, MD;  Location: [facility] OR;  Service: Vascular;  Laterality: Right;   APPLICATION OF WOUND VAC Right 10/01/2024   Procedure: APPLICATION, WOUND VAC;  Surgeon: Lanis Fonda BRAVO, MD;  Location: [facility] OR;  Service: Vascular;  Laterality: Right;   APPLICATION, SKIN SUBSTITUTE Right 09/27/2024   Procedure: APPLICATION, MYRIAD  SKIN SUBSTITUTE;  Surgeon: Lanis Fonda BRAVO, MD;  Location: [facility] OR;  Service: Vascular;  Laterality: Right;   APPLICATION, SKIN SUBSTITUTE Right 10/01/2024   Procedure: APPLICATION, SKIN SUBSTITUTE KERECIS 95SQ CM;  Surgeon: Lanis Fonda BRAVO, MD;  [facility];  Service: Vascular;  Laterality: Right;   AXILLARY-FEMORAL BYPASS GRAFT Right 09/24/2024   Procedure: CREATION, BYPASS, ARTERIAL, AXILLARY TO BILATERAL FEMORAL, USING PROPATEN X 80CM X 2 GRAFT;  Surgeon: Serene Gaile ORN, MD;  [facility];  Service: Vascular;  Laterality: Right;   BACK SURGERY     CARDIAC CATHETERIZATION  03/10/2018   CATARACT EXTRACTION W/ INTRAOCULAR LENS IMPLANT Left    ENDARTERECTOMY FEMORAL Bilateral 09/24/2024   Procedure: BILATERAL FEMORAL ENDARTERECTOMY;  Surgeon: Serene Gaile ORN, MD;  [facility];  Service: Vascular;  Laterality: Bilateral;   EYE SURGERY     RETINAL SURGERY LEFT EYE   FASCIOTOMY Right 09/24/2024   Procedure: RIGHT LOWER LEG FOUR COMPARTMENT FASCIOTOMY WITH RESECTION OF TIBIALIS ANTERIOR;  Surgeon: Serene Gaile ORN, MD;  [facility];  Service: Vascular;  Laterality: Right;   FASCIOTOMY CLOSURE Right 09/27/2024   Procedure: RIGHT LOWER EXTREMITY FASCIOTOMY WASHOUT;  Surgeon: Lanis Fonda BRAVO, MD;  Location: [facility] OR;  Service: Vascular;  Laterality: Right;  RIGHT LEG CLOSURE   HYDROCELE EXCISION Left 03/05/2013   Procedure: HYDROCELECTOMY ADULT;  Surgeon: Donnice Gwenyth Brooks, MD;  Location: [facility];  Service: Urology;  Laterality: Left;   INCISION AND DRAINAGE OF DEEP ABSCESS, CALF Right 09/29/2024   Procedure: INCISION AND DRAINAGE OF DEEP ABSCESS, CALF;  Surgeon: Harden Jerona GAILS, MD;  [facility];  Service: Orthopedics;  Laterality: Right;   INCISION AND DRAINAGE OF DEEP ABSCESS, CALF Right 10/06/2024   Procedure: INCISION AND DRAINAGE OF DEEP ABSCESS, CALF RIGHT;  Surgeon: Harden Jerona GAILS, MD;  [facility];  Service: Orthopedics;  Laterality: Right;   DEBRIDEMENT RIGHT LEG   INCISION AND DRAINAGE OF WOUND Right 10/01/2024   Procedure: IRRIGATION AND DEBRIDEMENT WOUND;  Surgeon: Lanis Fonda BRAVO, MD;  Location: [facility] OR;  Service: Vascular;  Laterality: Right;   INCISIONAL HERNIA REPAIR N/A 06/18/2022   Procedure: REPAIR OF INTERNAL HERNIA;  Surgeon: Rubin Calamity, MD;  Location: [facility] OR;  Service: General;  Laterality: N/A;   INGUINAL HERNIA REPAIR Bilateral AGE 23   INSERTION OF MESH N/A 06/11/2022   Procedure: INSERTION OF MESH;  Surgeon: Rubin Calamity, MD;  Location: [facility] OR;  Service: General;  Laterality: N/A;   INSERTION OF MESH N/A 06/18/2022   Procedure: INSERTION OF MESH;  Surgeon: Rubin Calamity, MD;  Location: [facility] OR;  Service: General;  Laterality: N/A;   KYPHOPLASTY N/A 05/08/2023   Procedure: THORACIC SEVEN KYPHOPLASTY;  Surgeon: Beuford Anes, MD;  [facility];  Service: Orthopedics;  Laterality: N/A;   LAPAROSCOPY N/A 06/18/2022   Procedure: LAPAROSCOPY DIAGNOSTIC;  Surgeon: Rubin Calamity, MD;  Location: [facility] OR;  Service: General;  Laterality: N/A;   LOWER EXTREMITY ANGIOGRAM Right 09/24/2024   Procedure: RIGHT LOWER EXTREMITY ANGIOGRAM AND RIGHT POPLETEAL THROMBECTOMY;  Surgeon: Serene Gaile ORN, MD;  [facility];  Service: Vascular;  Laterality: Right;   LYMPHADENECTOMY Bilateral 05/06/2013   Procedure: REDGIE;  Surgeon: Noretta Ferrara, MD;  Location: [facility];  Service: Urology;  Laterality: Bilateral;   PROSTATE BIOPSY N/A 03/05/2013   Procedure: PROSTATE BIOPSY and ultrasound;  Surgeon: Donnice Gwenyth Brooks, MD;  Location: [facility];  Service: Urology;  Laterality: N/A;   REMOVAL BURSA SAC, LEFT ELBOW  1996   ROBOT ASSISTED LAPAROSCOPIC RADICAL PROSTATECTOMY N/A 05/06/2013   Procedure: ROBOTIC ASSISTED LAPAROSCOPIC RADICAL PROSTATECTOMY LEVEL 3;  Surgeon: Noretta Ferrara, MD;  Location: [facility];  Service: Urology;  Laterality: N/A;   THROMBECTOMY FEMORAL ARTERY Bilateral 09/24/2024   Procedure:  BILATERAL FEMORAL ARTERY THROMBECTOMY;  Surgeon: Serene Gaile ORN, MD;  [facility];  Service: Vascular;  Laterality: Bilateral;   TONSILLECTOMY     as achild   TRANSFORAMINAL LUMBAR INTERBODY FUSION (TLIF)  WITH PEDICLE SCREW FIXATION 1 LEVEL Left 09/08/2024   Procedure: LEFT-SIDED LUMBAR 4- LUMBAR 5 TRANSFORAMINAL LUMBAR INTERBODY FUSION AND DECOMPRESSION WITH INSTRUMENTATION AND ALLOGRAFT;  Surgeon: Beuford Anes, MD;  [facility];  Service: Orthopedics;  Laterality: Left;  LEFT-SIDED LUMBAR 4- LUMBAR 5 TRANSFORAMINAL LUMBAR INTERBODY FUSION AND DECOMPRESSION WITH INSTRUMENTATION AND ALLOGRAFT   XI ROBOTIC ASSISTED VENTRAL HERNIA N/A 06/11/2022   Procedure: ROBOTIC INCISIONAL HERNIA REPAIR WITH MESH;  Surgeon: Rubin Calamity, MD;  Location: [facility] OR;  Service: General;  Laterality: N/A;   Past Surgical History:  Procedure Laterality Date   ABDOMINAL AORTOGRAM W/LOWER EXTREMITY N/A 09/21/2024   Procedure: ABDOMINAL AORTOGRAM W/LOWER EXTREMITY;  Surgeon: Serene Gaile ORN, MD;  [facility];  Service: Cardiovascular;  Laterality: N/A;   APPLICATION OF WOUND VAC Right 09/27/2024   Procedure: APPLICATION, WOUND VAC RIGHT LOWER EXTREMITY;  Surgeon: Lanis Fonda BRAVO, MD;  Location: [facility] OR;  Service: Vascular;  Laterality: Right;   APPLICATION OF WOUND VAC Right 10/01/2024   Procedure: APPLICATION, WOUND VAC;  Surgeon: Lanis Fonda BRAVO, MD;  Location: [facility] OR;  Service: Vascular;  Laterality: Right;   APPLICATION, SKIN SUBSTITUTE Right 09/27/2024   Procedure: APPLICATION, MYRIAD SKIN SUBSTITUTE;  Surgeon: Lanis Fonda BRAVO, MD;  Location: [facility] OR;  Service: Vascular;  Laterality: Right;   APPLICATION, SKIN SUBSTITUTE Right 10/01/2024   Procedure: APPLICATION, SKIN SUBSTITUTE KERECIS 95SQ CM;  Surgeon: Lanis Fonda BRAVO, MD;  [facility];  Service: Vascular;  Laterality: Right;   AXILLARY-FEMORAL BYPASS GRAFT Right 09/24/2024   Procedure: CREATION, BYPASS, ARTERIAL, AXILLARY TO BILATERAL  FEMORAL, USING PROPATEN X 80CM X 2 GRAFT;  Surgeon: Serene Gaile ORN, MD;  [facility];  Service: Vascular;  Laterality: Right;   BACK SURGERY     CARDIAC CATHETERIZATION  03/10/2018   CATARACT EXTRACTION W/ INTRAOCULAR LENS IMPLANT Left    ENDARTERECTOMY FEMORAL Bilateral 09/24/2024   Procedure: BILATERAL FEMORAL ENDARTERECTOMY;  Surgeon: Serene Gaile ORN, MD;  [facility];  Service: Vascular;  Laterality: Bilateral;   EYE SURGERY     RETINAL SURGERY LEFT EYE   FASCIOTOMY Right 09/24/2024   Procedure: RIGHT LOWER LEG FOUR COMPARTMENT FASCIOTOMY WITH RESECTION OF TIBIALIS ANTERIOR;  Surgeon: Serene Gaile ORN, MD;  [facility];  Service: Vascular;  Laterality: Right;   FASCIOTOMY CLOSURE Right 09/27/2024   Procedure: RIGHT LOWER EXTREMITY FASCIOTOMY WASHOUT;  Surgeon: Lanis Fonda BRAVO, MD;  Location: [facility] OR;  Service: Vascular;  Laterality: Right;  RIGHT LEG CLOSURE   HYDROCELE EXCISION Left 03/05/2013   Procedure: HYDROCELECTOMY ADULT;  Surgeon: Donnice Gwenyth Brooks, MD;  Location: [facility];  Service: Urology;  Laterality: Left;   INCISION AND DRAINAGE OF DEEP ABSCESS, CALF Right 09/29/2024   Procedure: INCISION AND DRAINAGE OF DEEP ABSCESS, CALF;  Surgeon: Harden Jerona GAILS, MD;  [facility];  Service: Orthopedics;  Laterality: Right;   INCISION AND DRAINAGE OF DEEP ABSCESS, CALF Right 10/06/2024   Procedure: INCISION AND DRAINAGE OF DEEP ABSCESS, CALF RIGHT;  Surgeon: Harden Jerona GAILS, MD;  [facility];  Service: Orthopedics;  Laterality: Right;  DEBRIDEMENT RIGHT LEG   INCISION AND DRAINAGE OF WOUND Right 10/01/2024   Procedure: IRRIGATION AND DEBRIDEMENT WOUND;  Surgeon: Lanis Fonda BRAVO, MD;  Location: [facility] OR;  Service: Vascular;  Laterality: Right;   INCISIONAL HERNIA REPAIR N/A 06/18/2022   Procedure: REPAIR OF INTERNAL HERNIA;  Surgeon: Rubin Calamity, MD;  Location: [facility] OR;  Service: General;  Laterality: N/A;  INGUINAL HERNIA REPAIR Bilateral AGE 12    INSERTION OF MESH N/A 06/11/2022   Procedure: INSERTION OF MESH;  Surgeon: Rubin Calamity, MD;  Location: [facility] OR;  Service: General;  Laterality: N/A;   INSERTION OF MESH N/A 06/18/2022   Procedure: INSERTION OF MESH;  Surgeon: Rubin Calamity, MD;  Location: [facility] OR;  Service: General;  Laterality: N/A;   KYPHOPLASTY N/A 05/08/2023   Procedure: THORACIC SEVEN KYPHOPLASTY;  Surgeon: Beuford Anes, MD;  [facility];  Service: Orthopedics;  Laterality: N/A;   LAPAROSCOPY N/A 06/18/2022   Procedure: LAPAROSCOPY DIAGNOSTIC;  Surgeon: Rubin Calamity, MD;  Location: [facility] OR;  Service: General;  Laterality: N/A;   LOWER EXTREMITY ANGIOGRAM Right 09/24/2024   Procedure: RIGHT LOWER EXTREMITY ANGIOGRAM AND RIGHT POPLETEAL THROMBECTOMY;  Surgeon: Serene Gaile ORN, MD;  [facility];  Service: Vascular;  Laterality: Right;   LYMPHADENECTOMY Bilateral 05/06/2013   Procedure: REDGIE;  Surgeon: Noretta Ferrara, MD;  Location: [facility];  Service: Urology;  Laterality: Bilateral;   PROSTATE BIOPSY N/A 03/05/2013   Procedure: PROSTATE BIOPSY and ultrasound;  Surgeon: Donnice Gwenyth Brooks, MD;  Location: [facility];  Service: Urology;  Laterality: N/A;   REMOVAL BURSA SAC, LEFT ELBOW  1996   ROBOT ASSISTED LAPAROSCOPIC RADICAL PROSTATECTOMY N/A 05/06/2013   Procedure: ROBOTIC ASSISTED LAPAROSCOPIC RADICAL PROSTATECTOMY LEVEL 3;  Surgeon: Noretta Ferrara, MD;  Location: [facility];  Service: Urology;  Laterality: N/A;   THROMBECTOMY FEMORAL ARTERY Bilateral 09/24/2024   Procedure: BILATERAL FEMORAL ARTERY THROMBECTOMY;  Surgeon: Serene Gaile ORN, MD;  [facility];  Service: Vascular;  Laterality: Bilateral;   TONSILLECTOMY     as achild   TRANSFORAMINAL LUMBAR INTERBODY FUSION (TLIF) WITH PEDICLE SCREW FIXATION 1 LEVEL Left 09/08/2024   Procedure: LEFT-SIDED LUMBAR 4- LUMBAR 5 TRANSFORAMINAL LUMBAR INTERBODY FUSION AND DECOMPRESSION WITH INSTRUMENTATION AND ALLOGRAFT;  Surgeon: Beuford Anes, MD;  [facility];  Service: Orthopedics;  Laterality: Left;  LEFT-SIDED LUMBAR 4- LUMBAR 5 TRANSFORAMINAL LUMBAR INTERBODY FUSION AND DECOMPRESSION WITH INSTRUMENTATION AND ALLOGRAFT   XI ROBOTIC ASSISTED VENTRAL HERNIA N/A 06/11/2022   Procedure: ROBOTIC INCISIONAL HERNIA REPAIR WITH MESH;  Surgeon: Rubin Calamity, MD;  [facility];  Service: General;  Laterality: N/A;   Past Medical History:  Diagnosis Date   Anxiety    new dx   Arthritis    lumbar   Atherosclerotic vascular disease    Cataract    COPD (chronic obstructive pulmonary disease) (HCC)    Diverticulosis    Elevated PSA    Headache(784.0)    MIGRAINES   Hypertension 2021   Iritis    CHRONIC IN LEFT EYE - SOME VISIAL IMPAIRMENT IN LEFT EYE   Neuromuscular disorder (HCC)    left leg/foot,pinched siactic nerve   Pain    PAIN LEFT HIP AND DOWN LT LEG WITH NUMBNESS IN LEFT LEG--PT STATES SCIATIC NERVE IMPINGEMENT - PT PLANS BACK IN THE NEAR SURGERY.   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   Renal cysts, acquired, bilateral 03/19/2013   several simple , CT   Urinary frequency    AND NOCTURIA   BP (!) 165/74 Comment: taking midodrine  tid  Pulse 74   Ht 5' 9 (1.753 m)   Wt 185 lb 9.6 oz (84.2 kg)   SpO2 93%   BMI 27.41 kg/m   Opioid Risk Score:   Fall Risk Score:  `1  Depression screen [facility] 2/9     11/24/2024  10:50 AM  Depression screen PHQ 2/9  Decreased Interest 0  Down, Depressed, Hopeless 0  PHQ - 2 Score 0  Altered sleeping 0  Tired, decreased energy 2  Change in appetite 0  Feeling bad or failure about yourself  2  Trouble concentrating 0  Moving slowly or fidgety/restless 0  Suicidal thoughts 0  PHQ-9 Score 4    Review of Systems  Musculoskeletal:  Positive for gait problem.       Right foot  All other systems reviewed and are negative.      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    Will increase Duloxetine -  60 mg at bedtime-  for 2 weeks, then 90 mg at bedtime x  1 week, then 120 mg at bedtime. For nerve pain. Per computer will need PA done.   2. Call me/my chart message in 2-4 weeks to let me know how it's going, if and NOT a lot improvement, then will send in Low dose Naltrexone to [facility]-  [phone] [address]  If we do this medicine, I'll call in the medicine and they will call you when it's done- can either pick it up and have them mail it.  1 week, for 4 mg and then 30 days with refills on 8 mg.   3.   Look into insurance will cover transportation    4. PT referral- eval and treat- for R foot drop and recovery. [address]    5. Midodrine -  currently- 10 mg- 3x/day- cut it to 5 mg 3x/day- for low Blood pressure- since BP going higher on Midodrine  10 mg 3x/day.  5 mg 3x/day x 1 month- then try to decrease to 5 mg 2x/day- breakfast/lunch not dinner time. If PCP wants to decrease more, that's fine.    6. F/U in 3 months- on R foot drop-     7. Call [facility]- and get brace refitted  8. Call and see if want to get rid of W/C.   I spent a total of  44  minutes on total care today- >50% coordination of care- due to d/w pt about options for nerve pain- and R foot drop- as well as w/c and therapy and R AFO   "

## 2025-01-03 NOTE — Patient Instructions (Addendum)
 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    Will increase Duloxetine -  60 mg at bedtime-  for 2 weeks, then 90 mg at bedtime x  1 week, then 120 mg at bedtime. For nerve pain. Per computer will need PA done.   2. Call me/my chart message in 2-4 weeks to let me know how it's going, if and NOT a lot improvement, then will send in Low dose Naltrexone to [facility]-  [phone] [address]  If we do this medicine, I'll call in the medicine and they will call you when it's done- can either pick it up and have them mail it.  1 week, for 4 mg and then 30 days with refills on 8 mg.   3.   Look into insurance will cover transportation    4. PT referral- eval and treat- for R foot drop and recovery. [address]    5. Midodrine -  currently- 10 mg- 3x/day- cut it to 5 mg 3x/day- for low Blood pressure- since BP going higher on Midodrine  10 mg 3x/day.  5 mg 3x/day x 1 month- then try to decrease to 5 mg 2x/day- breakfast/lunch not dinner time. If PCP wants to decrease more, that's fine.    6. F/U in 3 months- on R foot drop-     7. Call [facility]- and get brace refitted  8. Call and see if want to get rid of W/C.

## 2025-01-11 ENCOUNTER — Ambulatory Visit: Attending: Physical Medicine and Rehabilitation

## 2025-01-11 ENCOUNTER — Other Ambulatory Visit: Payer: Self-pay

## 2025-01-11 DIAGNOSIS — R2689 Other abnormalities of gait and mobility: Secondary | ICD-10-CM | POA: Diagnosis present

## 2025-01-11 DIAGNOSIS — T79A21D Traumatic compartment syndrome of right lower extremity, subsequent encounter: Secondary | ICD-10-CM | POA: Diagnosis not present

## 2025-01-11 DIAGNOSIS — M79661 Pain in right lower leg: Secondary | ICD-10-CM | POA: Diagnosis present

## 2025-01-11 DIAGNOSIS — I739 Peripheral vascular disease, unspecified: Secondary | ICD-10-CM | POA: Diagnosis not present

## 2025-01-11 DIAGNOSIS — M79671 Pain in right foot: Secondary | ICD-10-CM | POA: Insufficient documentation

## 2025-01-11 DIAGNOSIS — M6281 Muscle weakness (generalized): Secondary | ICD-10-CM | POA: Diagnosis present

## 2025-01-11 DIAGNOSIS — M21371 Foot drop, right foot: Secondary | ICD-10-CM | POA: Diagnosis present

## 2025-01-11 NOTE — Therapy (Signed)
 " OUTPATIENT PHYSICAL THERAPY LOWER EXTREMITY EVALUATION   Patient Name: David King MRN: [id number] DOB:09-06-1956, 69 y.o., male Today's Date: 01/11/2025  END OF SESSION:  PT End of Session - 01/11/25 1304     Visit Number 1    Number of Visits 24    Date for Recertification  04/05/25    Authorization Type [employer] Medicare    PT Start Time 1100    PT Stop Time 1150    PT Time Calculation (min) 50 min    Activity Tolerance Patient tolerated treatment well    Behavior During Therapy WFL for tasks assessed/performed          Past Medical History:  Diagnosis Date   Anxiety    new dx   Arthritis    lumbar   Atherosclerotic vascular disease    Cataract    COPD (chronic obstructive pulmonary disease) (HCC)    Diverticulosis    Elevated PSA    Headache(784.0)    MIGRAINES   Hypertension 2021   Iritis    CHRONIC IN LEFT EYE - SOME VISIAL IMPAIRMENT IN LEFT EYE   Neuromuscular disorder (HCC)    left leg/foot,pinched siactic nerve   Pain    PAIN LEFT HIP AND DOWN LT LEG WITH NUMBNESS IN LEFT LEG--PT STATES SCIATIC NERVE IMPINGEMENT - PT PLANS BACK IN THE NEAR SURGERY.   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   Renal cysts, acquired, bilateral 03/19/2013   several simple , CT   Urinary frequency    AND NOCTURIA   Past Surgical History:  Procedure Laterality Date   ABDOMINAL AORTOGRAM W/LOWER EXTREMITY N/A 09/21/2024   Procedure: ABDOMINAL AORTOGRAM W/LOWER EXTREMITY;  Surgeon: Serene Gaile ORN, MD;  Location: [facility];  Service: Cardiovascular;  Laterality: N/A;   APPLICATION OF WOUND VAC Right 09/27/2024   Procedure: APPLICATION, WOUND VAC RIGHT LOWER EXTREMITY;  Surgeon: Lanis Fonda BRAVO, MD;  Location: [facility] OR;  Service: Vascular;  Laterality: Right;   APPLICATION OF WOUND VAC Right 10/01/2024   Procedure: APPLICATION, WOUND VAC;  Surgeon: Lanis Fonda BRAVO, MD;  Location: [facility] OR;  Service: Vascular;  Laterality: Right;    APPLICATION, SKIN SUBSTITUTE Right 09/27/2024   Procedure: APPLICATION, MYRIAD SKIN SUBSTITUTE;  Surgeon: Lanis Fonda BRAVO, MD;  Location: [facility] OR;  Service: Vascular;  Laterality: Right;   APPLICATION, SKIN SUBSTITUTE Right 10/01/2024   Procedure: APPLICATION, SKIN SUBSTITUTE KERECIS 95SQ CM;  Surgeon: Lanis Fonda BRAVO, MD;  Location: [facility] OR;  Service: Vascular;  Laterality: Right;   AXILLARY-FEMORAL BYPASS GRAFT Right 09/24/2024   Procedure: CREATION, BYPASS, ARTERIAL, AXILLARY TO BILATERAL FEMORAL, USING PROPATEN X 80CM X 2 GRAFT;  Surgeon: Serene Gaile ORN, MD;  Location: [facility];  Service: Vascular;  Laterality: Right;   BACK SURGERY     CARDIAC CATHETERIZATION  03/10/2018   CATARACT EXTRACTION W/ INTRAOCULAR LENS IMPLANT Left    ENDARTERECTOMY FEMORAL Bilateral 09/24/2024   Procedure: BILATERAL FEMORAL ENDARTERECTOMY;  Surgeon: Serene Gaile ORN, MD;  Location: [facility];  Service: Vascular;  Laterality: Bilateral;   EYE SURGERY     RETINAL SURGERY LEFT EYE   FASCIOTOMY Right 09/24/2024   Procedure: RIGHT LOWER LEG FOUR COMPARTMENT FASCIOTOMY WITH RESECTION OF TIBIALIS ANTERIOR;  Surgeon: Serene Gaile ORN, MD;  Location: [facility];  Service: Vascular;  Laterality: Right;   FASCIOTOMY CLOSURE Right 09/27/2024   Procedure: RIGHT LOWER EXTREMITY FASCIOTOMY WASHOUT;  Surgeon: Lanis Fonda BRAVO, MD;  Location: [facility]  OR;  Service: Vascular;  Laterality: Right;  RIGHT LEG CLOSURE   HYDROCELE EXCISION Left 03/05/2013   Procedure: HYDROCELECTOMY ADULT;  Surgeon: Donnice Gwenyth Brooks, MD;  Location: [facility];  Service: Urology;  Laterality: Left;   INCISION AND DRAINAGE OF DEEP ABSCESS, CALF Right 09/29/2024   Procedure: INCISION AND DRAINAGE OF DEEP ABSCESS, CALF;  Surgeon: Harden Jerona GAILS, MD;  Location: [facility];  Service: Orthopedics;  Laterality: Right;   INCISION AND DRAINAGE OF DEEP ABSCESS, CALF Right 10/06/2024   Procedure: INCISION AND DRAINAGE OF DEEP ABSCESS, CALF RIGHT;  Surgeon: Harden Jerona GAILS, MD;  Location: [facility];  Service: Orthopedics;  Laterality: Right;  DEBRIDEMENT RIGHT LEG   INCISION AND DRAINAGE OF WOUND Right 10/01/2024   Procedure: IRRIGATION AND DEBRIDEMENT WOUND;  Surgeon: Lanis Fonda BRAVO, MD;  Location: [facility] OR;  Service: Vascular;  Laterality: Right;   INCISIONAL HERNIA REPAIR N/A 06/18/2022   Procedure: REPAIR OF INTERNAL HERNIA;  Surgeon: Rubin Calamity, MD;  Location: [facility] OR;  Service: General;  Laterality: N/A;   INGUINAL HERNIA REPAIR Bilateral AGE 40   INSERTION OF MESH N/A 06/11/2022   Procedure: INSERTION OF MESH;  Surgeon: Rubin Calamity, MD;  Location: [facility] OR;  Service: General;  Laterality: N/A;   INSERTION OF MESH N/A 06/18/2022   Procedure: INSERTION OF MESH;  Surgeon: Rubin Calamity, MD;  Location: [facility] OR;  Service: General;  Laterality: N/A;   KYPHOPLASTY N/A 05/08/2023   Procedure: THORACIC SEVEN KYPHOPLASTY;  Surgeon: Beuford Anes, MD;  Location[facility];  Service: Orthopedics;  Laterality: N/A;   LAPAROSCOPY N/A 06/18/2022   Procedure: LAPAROSCOPY DIAGNOSTIC;  Surgeon: Rubin Calamity, MD;  Location: [facility] OR;  Service: General;  Laterality: N/A;   LOWER EXTREMITY ANGIOGRAM Right 09/24/2024   Procedure: RIGHT LOWER EXTREMITY ANGIOGRAM AND RIGHT POPLETEAL THROMBECTOMY;  Surgeon: Serene Gaile ORN, MD;  Location[facility];  Service: Vascular;  Laterality: Right;   LYMPHADENECTOMY Bilateral 05/06/2013   Procedure: REDGIE;  Surgeon: Noretta Ferrara, MD;  Location: [facility];  Service: Urology;  Laterality: Bilateral;   PROSTATE BIOPSY N/A 03/05/2013   Procedure: PROSTATE BIOPSY and ultrasound;  Surgeon: Donnice Gwenyth Brooks, MD;  Location: [facility];  Service: Urology;  Laterality: N/A;   REMOVAL BURSA SAC, LEFT ELBOW  1996   ROBOT ASSISTED LAPAROSCOPIC RADICAL PROSTATECTOMY N/A 05/06/2013   Procedure: ROBOTIC ASSISTED LAPAROSCOPIC RADICAL PROSTATECTOMY LEVEL 3;  Surgeon: Noretta Ferrara, MD;  Location: [facility];  Service: Urology;   Laterality: N/A;   THROMBECTOMY FEMORAL ARTERY Bilateral 09/24/2024   Procedure: BILATERAL FEMORAL ARTERY THROMBECTOMY;  Surgeon: Serene Gaile ORN, MD;  Location: [facility];  Service: Vascular;  Laterality: Bilateral;   TONSILLECTOMY     as achild   TRANSFORAMINAL LUMBAR INTERBODY FUSION (TLIF) WITH PEDICLE SCREW FIXATION 1 LEVEL Left 09/08/2024   Procedure: LEFT-SIDED LUMBAR 4- LUMBAR 5 TRANSFORAMINAL LUMBAR INTERBODY FUSION AND DECOMPRESSION WITH INSTRUMENTATION AND ALLOGRAFT;  Surgeon: Beuford Anes, MD;  Location: [facility];  Service: Orthopedics;  Laterality: Left;  LEFT-SIDED LUMBAR 4- LUMBAR 5 TRANSFORAMINAL LUMBAR INTERBODY FUSION AND DECOMPRESSION WITH INSTRUMENTATION AND ALLOGRAFT   XI ROBOTIC ASSISTED VENTRAL HERNIA N/A 06/11/2022   Procedure: ROBOTIC INCISIONAL HERNIA REPAIR WITH MESH;  Surgeon: Rubin Calamity, MD;  Location: [facility] OR;  Service: General;  Laterality: N/A;   Patient Active Problem List   Diagnosis Date Noted   Neuropathic pain 01/03/2025   Right foot drop 01/03/2025   Protein-calorie malnutrition, severe 10/02/2024   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 09/29/2024   Lumbar radiculopathy 09/15/2024   S/P lumbar fusion 09/08/2024   Radiculopathy due to lumbar intervertebral disc disorder 09/08/2024   Ileus following gastrointestinal surgery (HCC) 06/13/2022   Hx of fasciotomy 06/11/2022   Claudication 02/20/2022   Incisional hernia, without obstruction or gangrene 02/20/2022   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 02/20/2022   Degeneration, intervertebral disc, lumbar 09/10/2021   Osteoarthritis of spine with radiculopathy, lumbar region 09/10/2021   Low back pain 06/27/2021   Gastroesophageal reflux disease 06/15/2021   Hardening of the aorta (main artery of the heart) 06/15/2021   History of malignant neoplasm of prostate 06/15/2021   Insomnia 06/15/2021   Vitamin D  deficiency 06/15/2021   Essential hypertension 01/27/2020    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   Former smoker 08/05/2019   Pulmonary emphysema (HCC) 08/05/2019   Coronary artery disease involving native coronary artery of native heart without angina pectoris 03/06/2018   Unstable angina (HCC) 02/24/2018   Spinal stenosis of lumbar region 10/07/2013   Prostate cancer [facility])     PCP: [name], MD   REFERRING PROVIDER: Cornelio Bouchard, MD   REFERRING DIAG:  I73.9 (ICD-10-CM) - 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 T79.A21D (ICD-10-CM) - 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, subsequent encounter M21.371 (ICD-10-CM) - Right foot drop  THERAPY DIAG:  Pain in right lower leg - Plan: PT plan of care cert/re-cert  Pain in right foot - Plan: PT plan of care cert/re-cert  Foot drop, right - Plan: PT plan of care cert/re-cert  Muscle weakness (generalized) - Plan: PT plan of care cert/re-cert  Other abnormalities of gait and mobility - Plan: PT plan of care cert/re-cert  Rationale for Evaluation and Treatment: Rehabilitation  ONSET DATE:  09/08/24 - L4/5 decompression and fusion 09/24/24 - OR x2 initially for R axial bypass, bilateral femoral thrombectomy and RLE fasciotomy and later for redo RLE thrombectomy S/p R lower leg fasciotomy 10/1 and I & D 10/3, repeat debridement 10/8  SUBJECTIVE:   SUBJECTIVE STATEMENT: Pt presents to PT after hospital stay following anaphylaxis during L4/5 fusion with subsequent complications and thrombectomies and fasciotomies R LE. Ambulating with AFO due to R foot drop, feels like today is a good day but he has a lot of nerve related pain in R foot. Did well with inpatient rehab, felt home health was not pushing him enough. Wants to be able to walk with more endurance and improve all ADL performance to be independent.   PERTINENT HISTORY: HTN, L4/5 decompression/fusion, Hx of cancer, see other PMH  PAIN:  Are you having pain?  Yes: NPRS scale: 2/10 Worst: 10/10 Pain  location: R heel, R plantar surface, R foot  Pain description: tingling, sharp, sore Aggravating factors: walking, prolonged standing Relieving factors: rest, medication   PRECAUTIONS: None  RED FLAGS: None   WEIGHT BEARING RESTRICTIONS: No  FALLS:  Has patient fallen in last 6 months? No  LIVING ENVIRONMENT: Lives with: lives with their family Lives in: House/apartment Stairs: Yes: External: 4 steps; can reach both Has following equipment at home: Environmental Consultant - 2 wheeled, Shower bench, Grab bars, and R foot AFO  OCCUPATION: Retired   PLOF: Independent with basic ADLs and Independent with household mobility with device  PATIENT GOALS: be able to do more self care independently, be able to walk around at festival, be able to go camping   NEXT MD VISIT: 01/19/2025  OBJECTIVE:  Note: Objective measures were completed at Evaluation unless  otherwise noted.  DIAGNOSTIC FINDINGS: N/A  PATIENT SURVEYS:  PSFS: THE PATIENT SPECIFIC FUNCTIONAL SCALE  Place score of 0-10 (0 = unable to perform activity and 10 = able to perform activity at the same level as before injury or problem)  Activity Date: 01/11/25    Shower impendent  6    2.  Walking @ festival  3    3. Camping 2    4.      Total Score 3.67      Total Score = Sum of activity scores/number of activities  Minimally Detectable Change: 3 points (for single activity); 2 points (for average score)  [facility] (nd). The Patient Specific Functional Scale . Retrieved from [email or web address]   COGNITION: Overall cognitive status: Within functional limits for tasks assessed    SENSATION: Global R foot/ankle paresthesias   POSTURE: rounded shoulders, forward head, and flexed trunk   PALPATION: TTP to R anterior tibials   LOWER EXTREMITY ROM:  Active ROM Right eval Left eval  Hip flexion    Hip extension    Hip abduction    Hip adduction    Hip  internal rotation    Hip external rotation    Knee flexion    Knee extension    Ankle dorsiflexion    Ankle plantarflexion    Ankle inversion    Ankle eversion     (Blank rows = not tested)  LOWER EXTREMITY MMT:  MMT Right eval Left eval  Hip flexion    Hip extension    Hip abduction    Hip adduction    Hip internal rotation    Hip external rotation    Knee flexion    Knee extension    Ankle dorsiflexion    Ankle plantarflexion    Ankle inversion    Ankle eversion     (Blank rows = not tested)  LOWER EXTREMITY SPECIAL TESTS:  DNT  FUNCTIONAL TESTS:  30 Second Sit to Stand: 15 reps no UE : 666ft with FWW - 1 seated rest break  GAIT: Distance walked: 8ft Assistive device utilized: Environmental Consultant - 2 wheeled Level of assistance: Modified independence Comments: AFO R LE, flexed trunk, FWW   TREATMENT: [facility] Adult PT Treatment:                                                DATE: 01/11/2025 Therapeutic Exercise: STS x 10 SLR x 5 R S/L hip ab x 5 R 90/90 table top x 15 sec Supine PPT x 5 - 5 hold Seated heel raise x 5  PATIENT EDUCATION:  Education details: eval findings, PSFS, HEP, POC Person educated: Patient Education method: Explanation, Demonstration, and Handouts Education comprehension: verbalized understanding and returned demonstration  HOME EXERCISE PROGRAM: Access Code: [email or web address] URL: [email or web address] Date: 01/11/2025 Prepared by: Alm Kingdom  Exercises - Sit to Stand Without Arm Support  - 1-2 x daily - 7 x weekly - 3 sets - 10-15 reps - Active Straight Leg Raise with Quad Set  - 1-2 x daily - 7 x weekly - 2-3 sets - 10 reps - Sidelying Hip Abduction  - 1-2 x daily - 7 x weekly - 2-3 sets - 10 reps - Supine 90/90 Abdominal Bracing  - 1-2 x daily - 7 x weekly - 2-3 sets - 10-15 sec hold - Supine Posterior Pelvic Tilt  -  1-2 x daily - 7 x weekly - 2 sets - 10 reps - 5 sec hold - Seated Heel Raise  - 1-2 x daily - 7 x weekly - 3  sets - 15 reps  ASSESSMENT:  CLINICAL IMPRESSION: Patient is a 69 y.o. M who was seen today for physical therapy evaluation and treatment after hospital course complication from L4/5 fusion on 09/08/24 with complications and resultant thrombectomies and fasciotomies on 09/24/24. Physical findings today are consistent with documented impairments as pt demonstrates decreased trunk and LE strength as well as gait and functional mobility/endurance deficits. is well below age-related normative value, however he demonstrated good proximal hip mobility with 30 Second Sit to Stand. Trace contraction felt at R anterior tibialis, good calf strength noted. He would benefit from skilled PT services working to improve his strength and endurance after complicated hospital course and surgeries as he is operating well below independent PLOF.   OBJECTIVE IMPAIRMENTS: Abnormal gait, decreased activity tolerance, decreased balance, decreased endurance, decreased mobility, difficulty walking, decreased ROM, decreased strength, and pain  ACTIVITY LIMITATIONS: carrying, lifting, standing, squatting, stairs, transfers, and locomotion level  PARTICIPATION LIMITATIONS: meal prep, cleaning, driving, shopping, community activity, occupation, and yard work  PERSONAL FACTORS: Time since onset of injury/illness/exacerbation and 3+ comorbidities: HTN, L4/5 decompression/fusion, Hx of cancer, see other PMH are also affecting patient's functional outcome.   REHAB POTENTIAL: Good  CLINICAL DECISION MAKING: Evolving/moderate complexity  EVALUATION COMPLEXITY: Moderate   GOALS: Goals reviewed with patient? No  SHORT TERM GOALS: Target date: 02/01/2025   Pt will be compliant and knowledgeable with initial HEP for improved comfort and carryover Baseline: initial HEP given  Goal status: INITIAL  2.  Pt will self report right LE pain no greater than 7/10 for improved comfort and functional ability Baseline: 10/10 at  worst Goal status: INITIAL   LONG TERM GOALS: Target date: 04/05/2025   Pt will improve PSFS functional score to at least 5.67 for improved subjective functioning  Baseline: 3.67 Goal status: INITIAL  2.  Pt will self report right LE pain no greater than 5/10 for improved comfort and functional ability Baseline: 10/10 at worst Goal status: INITIAL   3.  Pt will increase 30 Second Sit to Stand rep count to no less than 17 reps for improved balance, strength, and functional mobility Baseline: 15 reps  Goal status: INITIAL   4.  Pt will improve distance with LRAD to at least 1266ft with no increase in back pain for improved comfort and functional ability with community ambulation  Baseline: 658ft with FWW - 1 seated rest break Goal status: INITIAL  5.  Pt will be able to perform all home ADLs independently with no R LE pain in order to improve comfort and QoL Baseline: unable Goal status: INITIAL   PLAN:  PT FREQUENCY: 2x/week  PT DURATION: 12 weeks  PLANNED INTERVENTIONS: 97164- PT Re-evaluation, 97110-Therapeutic exercises, 97530- Therapeutic activity, [id number]- Neuromuscular re-education, 97535- Self Care, [id number]- Manual therapy, [id number]- Gait training, [phone]- Electrical stimulation (unattended), [id number]- Electrical stimulation (manual), 97016- Vasopneumatic device, and Patient/Family education  PLAN FOR NEXT SESSION: assess HEP response, LE strengthening, gait training, core training/strengthening   Date of referral: 01/03/2025 Referring provider: Cornelio Bouchard, MD  Referring diagnosis?  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 (if different than referring diagnosis)  Pain in right lower  leg Pain in right foot Foot drop, right Muscle weakness (generalized) Other abnormalities of  gait and mobility  What was this (referring dx) caused by? Other: surgical and hospital complications  Lysle of Condition: Chronic (continuous duration > 3 months)   Laterality: Rt  Current Functional Measure Score: Patient Specific Functional Scale 3.67  Objective measurements identify impairments when they are compared to normal values, the uninvolved extremity, and prior level of function.  [x]  Yes  []  No  Objective assessment of functional ability: Severe functional limitations   Briefly describe symptoms:  Patient is a 69 y.o. M who was seen today for physical therapy evaluation and treatment after hospital course complication from L4/5 fusion on 09/08/24 with complications and resultant thrombectomies and fasciotomies on 09/24/24. Physical findings today are consistent with documented impairments as pt demonstrates decreased trunk and LE strength as well as gait and functional mobility/endurance deficits. is well below age-related normative value, however he demonstrated good proximal hip mobility with 30 Second Sit to Stand. Trace contraction felt at R anterior tibialis, good calf strength noted. He would benefit from skilled PT services working to improve his strength and endurance after complicated hospital course and surgeries as he is operating well below independent PLOF.   How did symptoms start:  Pt presents to PT after hospital stay following anaphylaxis during L4/5 fusion with subsequent complications and thrombectomies and fasciotomies R LE. Ambulating with AFO due to R foot drop, feels like today is a good day but he has a lot of nerve related pain in R foot. Did well with inpatient rehab, felt home health was not pushing him enough. Wants to be able to walk with more endurance and improve all ADL performance to be independent.   Average pain intensity:  Last 24 hours: 4/10  Past week: 8/10  How often does the pt experience symptoms? Constantly  How much have the  symptoms interfered with usual daily activities? Extremely  How has condition changed since care began at this facility? NA - initial visit  In general, how is the patients overall health? Good   BACK PAIN (STarT Back Screening Tool) No   Alm JAYSON Kingdom, PT 01/11/2025, 2:35 PM  "

## 2025-01-17 ENCOUNTER — Ambulatory Visit: Admitting: Physical Therapy

## 2025-01-17 ENCOUNTER — Encounter: Payer: Self-pay | Admitting: Physical Therapy

## 2025-01-17 DIAGNOSIS — M79671 Pain in right foot: Secondary | ICD-10-CM

## 2025-01-17 DIAGNOSIS — M21371 Foot drop, right foot: Secondary | ICD-10-CM

## 2025-01-17 DIAGNOSIS — M79661 Pain in right lower leg: Secondary | ICD-10-CM

## 2025-01-17 NOTE — Therapy (Signed)
 " OUTPATIENT PHYSICAL THERAPY LOWER EXTREMITY TREATMENT   Patient Name: David King MRN: [id number] DOB:December 08, 1956, 69 y.o., male Today's Date: 01/17/2025  END OF SESSION:  PT End of Session - 01/17/25 1439     Visit Number 2    Number of Visits 24    Date for Recertification  04/05/25    Authorization Type [employer] Medicare    Authorization Time Period 01/11/25-03/08/25    PT Start Time 0200    PT Stop Time 0240    PT Time Calculation (min) 40 min           Past Medical History:  Diagnosis Date   Anxiety    new dx   Arthritis    lumbar   Atherosclerotic vascular disease    Cataract    COPD (chronic obstructive pulmonary disease) (HCC)    Diverticulosis    Elevated PSA    Headache(784.0)    MIGRAINES   Hypertension 2021   Iritis    CHRONIC IN LEFT EYE - SOME VISIAL IMPAIRMENT IN LEFT EYE   Neuromuscular disorder (HCC)    left leg/foot,pinched siactic nerve   Pain    PAIN LEFT HIP AND DOWN LT LEG WITH NUMBNESS IN LEFT LEG--PT STATES SCIATIC NERVE IMPINGEMENT - PT PLANS BACK IN THE NEAR SURGERY.   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   Renal cysts, acquired, bilateral 03/19/2013   several simple , CT   Urinary frequency    AND NOCTURIA   Past Surgical History:  Procedure Laterality Date   ABDOMINAL AORTOGRAM W/LOWER EXTREMITY N/A 09/21/2024   Procedure: ABDOMINAL AORTOGRAM W/LOWER EXTREMITY;  Surgeon: Serene Gaile ORN, MD;  Location: [facility];  Service: Cardiovascular;  Laterality: N/A;   APPLICATION OF WOUND VAC Right 09/27/2024   Procedure: APPLICATION, WOUND VAC RIGHT LOWER EXTREMITY;  Surgeon: Lanis Fonda BRAVO, MD;  Location: [facility] OR;  Service: Vascular;  Laterality: Right;   APPLICATION OF WOUND VAC Right 10/01/2024   Procedure: APPLICATION, WOUND VAC;  Surgeon: Lanis Fonda BRAVO, MD;  Location: [facility] OR;  Service: Vascular;  Laterality: Right;   APPLICATION, SKIN SUBSTITUTE Right 09/27/2024   Procedure: APPLICATION,  MYRIAD SKIN SUBSTITUTE;  Surgeon: Lanis Fonda BRAVO, MD;  Location: [facility] OR;  Service: Vascular;  Laterality: Right;   APPLICATION, SKIN SUBSTITUTE Right 10/01/2024   Procedure: APPLICATION, SKIN SUBSTITUTE KERECIS 95SQ CM;  Surgeon: Lanis Fonda BRAVO, MD;  Location: [facility] OR;  Service: Vascular;  Laterality: Right;   AXILLARY-FEMORAL BYPASS GRAFT Right 09/24/2024   Procedure: CREATION, BYPASS, ARTERIAL, AXILLARY TO BILATERAL FEMORAL, USING PROPATEN X 80CM X 2 GRAFT;  Surgeon: Serene Gaile ORN, MD;  Location: [facility] OR;  Service: Vascular;  Laterality: Right;   BACK SURGERY     CARDIAC CATHETERIZATION  03/10/2018   CATARACT EXTRACTION W/ INTRAOCULAR LENS IMPLANT Left    ENDARTERECTOMY FEMORAL Bilateral 09/24/2024   Procedure: BILATERAL FEMORAL ENDARTERECTOMY;  Surgeon: Serene Gaile ORN, MD;  Location: [facility] OR;  Service: Vascular;  Laterality: Bilateral;   EYE SURGERY     RETINAL SURGERY LEFT EYE   FASCIOTOMY Right 09/24/2024   Procedure: RIGHT LOWER LEG FOUR COMPARTMENT FASCIOTOMY WITH RESECTION OF TIBIALIS ANTERIOR;  Surgeon: Serene Gaile ORN, MD;  Location: [facility];  Service: Vascular;  Laterality: Right;   FASCIOTOMY CLOSURE Right 09/27/2024   Procedure: RIGHT LOWER EXTREMITY FASCIOTOMY WASHOUT;  Surgeon: Lanis Fonda BRAVO, MD;  Location: [facility] OR;  Service: Vascular;  Laterality: Right;  RIGHT LEG CLOSURE  HYDROCELE EXCISION Left 03/05/2013   Procedure: HYDROCELECTOMY ADULT;  Surgeon: Donnice Gwenyth Brooks, MD;  Location: [facility];  Service: Urology;  Laterality: Left;   INCISION AND DRAINAGE OF DEEP ABSCESS, CALF Right 09/29/2024   Procedure: INCISION AND DRAINAGE OF DEEP ABSCESS, CALF;  Surgeon: Harden Jerona GAILS, MD;  Location: [facility];  Service: Orthopedics;  Laterality: Right;   INCISION AND DRAINAGE OF DEEP ABSCESS, CALF Right 10/06/2024   Procedure: INCISION AND DRAINAGE OF DEEP ABSCESS, CALF RIGHT;  Surgeon: Harden Jerona GAILS, MD;  Location: [facility] OR;  Service: Orthopedics;  Laterality: Right;   DEBRIDEMENT RIGHT LEG   INCISION AND DRAINAGE OF WOUND Right 10/01/2024   Procedure: IRRIGATION AND DEBRIDEMENT WOUND;  Surgeon: Lanis Fonda BRAVO, MD;  Location: [facility] OR;  Service: Vascular;  Laterality: Right;   INCISIONAL HERNIA REPAIR N/A 06/18/2022   Procedure: REPAIR OF INTERNAL HERNIA;  Surgeon: Rubin Calamity, MD;  Location: [facility] OR;  Service: General;  Laterality: N/A;   INGUINAL HERNIA REPAIR Bilateral AGE 1   INSERTION OF MESH N/A 06/11/2022   Procedure: INSERTION OF MESH;  Surgeon: Rubin Calamity, MD;  Location: [facility] OR;  Service: General;  Laterality: N/A;   INSERTION OF MESH N/A 06/18/2022   Procedure: INSERTION OF MESH;  Surgeon: Rubin Calamity, MD;  Location: [facility] OR;  Service: General;  Laterality: N/A;   KYPHOPLASTY N/A 05/08/2023   Procedure: THORACIC SEVEN KYPHOPLASTY;  Surgeon: Beuford Anes, MD;  Location: [facility];  Service: Orthopedics;  Laterality: N/A;   LAPAROSCOPY N/A 06/18/2022   Procedure: LAPAROSCOPY DIAGNOSTIC;  Surgeon: Rubin Calamity, MD;  Location: [facility] OR;  Service: General;  Laterality: N/A;   LOWER EXTREMITY ANGIOGRAM Right 09/24/2024   Procedure: RIGHT LOWER EXTREMITY ANGIOGRAM AND RIGHT POPLETEAL THROMBECTOMY;  Surgeon: Serene Gaile ORN, MD;  Location: [facility];  Service: Vascular;  Laterality: Right;   LYMPHADENECTOMY Bilateral 05/06/2013   Procedure: REDGIE;  Surgeon: Noretta Ferrara, MD;  Location: [facility];  Service: Urology;  Laterality: Bilateral;   PROSTATE BIOPSY N/A 03/05/2013   Procedure: PROSTATE BIOPSY and ultrasound;  Surgeon: Donnice Gwenyth Brooks, MD;  Location: [facility];  Service: Urology;  Laterality: N/A;   REMOVAL BURSA SAC, LEFT ELBOW  1996   ROBOT ASSISTED LAPAROSCOPIC RADICAL PROSTATECTOMY N/A 05/06/2013   Procedure: ROBOTIC ASSISTED LAPAROSCOPIC RADICAL PROSTATECTOMY LEVEL 3;  Surgeon: Noretta Ferrara, MD;  Location: [facility];  Service: Urology;  Laterality: N/A;   THROMBECTOMY FEMORAL ARTERY Bilateral 09/24/2024   Procedure:  BILATERAL FEMORAL ARTERY THROMBECTOMY;  Surgeon: Serene Gaile ORN, MD;  Location: [facility];  Service: Vascular;  Laterality: Bilateral;   TONSILLECTOMY     as achild   TRANSFORAMINAL LUMBAR INTERBODY FUSION (TLIF) WITH PEDICLE SCREW FIXATION 1 LEVEL Left 09/08/2024   Procedure: LEFT-SIDED LUMBAR 4- LUMBAR 5 TRANSFORAMINAL LUMBAR INTERBODY FUSION AND DECOMPRESSION WITH INSTRUMENTATION AND ALLOGRAFT;  Surgeon: Beuford Anes, MD;  Location: [facility] OR;  Service: Orthopedics;  Laterality: Left;  LEFT-SIDED LUMBAR 4- LUMBAR 5 TRANSFORAMINAL LUMBAR INTERBODY FUSION AND DECOMPRESSION WITH INSTRUMENTATION AND ALLOGRAFT   XI ROBOTIC ASSISTED VENTRAL HERNIA N/A 06/11/2022   Procedure: ROBOTIC INCISIONAL HERNIA REPAIR WITH MESH;  Surgeon: Rubin Calamity, MD;  Location: [facility] OR;  Service: General;  Laterality: N/A;   Patient Active Problem List   Diagnosis Date Noted   Neuropathic pain 01/03/2025   Right foot drop 01/03/2025   Protein-calorie malnutrition, severe 10/02/2024   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 09/29/2024   Lumbar radiculopathy 09/15/2024   S/P lumbar  fusion 09/08/2024   Radiculopathy due to lumbar intervertebral disc disorder 09/08/2024   Ileus following gastrointestinal surgery (HCC) 06/13/2022   Hx of fasciotomy 06/11/2022   Claudication 02/20/2022   Incisional hernia, without obstruction or gangrene 02/20/2022   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 02/20/2022   Degeneration, intervertebral disc, lumbar 09/10/2021   Osteoarthritis of spine with radiculopathy, lumbar region 09/10/2021   Low back pain 06/27/2021   Gastroesophageal reflux disease 06/15/2021   Hardening of the aorta (main artery of the heart) 06/15/2021   History of malignant neoplasm of prostate 06/15/2021   Insomnia 06/15/2021   Vitamin D  deficiency 06/15/2021   Essential hypertension 01/27/2020   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   Former smoker 08/05/2019   Pulmonary emphysema  (HCC) 08/05/2019   Coronary artery disease involving native coronary artery of native heart without angina pectoris 03/06/2018   Unstable angina (HCC) 02/24/2018   Spinal stenosis of lumbar region 10/07/2013   Prostate cancer [facility])     PCP: [name], MD   REFERRING PROVIDER: Cornelio Bouchard, MD   REFERRING DIAG:  I73.9 (ICD-10-CM) - 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 T79.A21D (ICD-10-CM) - 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 Pain in foot and shin 7/10.     EVAL: Pt presents to PT after hospital stay following anaphylaxis during L4/5 fusion with subsequent complications and thrombectomies and fasciotomies R LE. Ambulating with AFO due to R foot drop, feels like today is a good day but he has a lot of nerve related pain in R foot. Did well with inpatient rehab, felt home health was not pushing him enough. Wants to be able to walk with more endurance and improve all ADL performance to be independent.   PERTINENT HISTORY: HTN, L4/5 decompression/fusion, Hx of cancer, see other PMH  PAIN:  Are you having pain?  Yes: NPRS scale: 7/10 Worst: 10/10 Pain location: R heel, R plantar surface, R foot  Pain description: tingling, sharp, sore Aggravating factors: walking, prolonged standing Relieving factors: rest, medication   PRECAUTIONS: None  RED FLAGS: None   WEIGHT BEARING RESTRICTIONS:  No  FALLS:  Has patient fallen in last 6 months? No  LIVING ENVIRONMENT: Lives with: lives with their family Lives in: House/apartment Stairs: Yes: External: 4 steps; can reach both Has following equipment at home: Environmental Consultant - 2 wheeled, Shower bench, Grab bars, and R foot AFO  OCCUPATION: Retired   PLOF: Independent with basic ADLs and Independent with household mobility with device  PATIENT GOALS: be able to do more self care independently, be able to walk around at festival, be able to go camping   NEXT MD VISIT: 01/19/2025  OBJECTIVE:  Note: Objective measures were completed at Evaluation unless otherwise noted.  DIAGNOSTIC FINDINGS: N/A  PATIENT SURVEYS:  PSFS: THE PATIENT SPECIFIC FUNCTIONAL SCALE  Place score of 0-10 (0 = unable to perform activity and 10 = able to perform activity at the same level as  before injury or problem)  Activity Date: 01/11/25    Shower impendent  6    2.  Walking @ festival  3    3. Camping 2    4.      Total Score 3.67      Total Score = Sum of activity scores/number of activities  Minimally Detectable Change: 3 points (for single activity); 2 points (for average score)  [facility] (nd). The Patient Specific Functional Scale . Retrieved from [email or web address]   COGNITION: Overall cognitive status: Within functional limits for tasks assessed    SENSATION: Global R foot/ankle paresthesias   POSTURE: rounded shoulders, forward head, and flexed trunk   PALPATION: TTP to R anterior tibials   LOWER EXTREMITY ROM:  Active ROM Right eval Left eval  Hip flexion    Hip extension    Hip abduction    Hip adduction    Hip internal rotation    Hip external rotation    Knee flexion    Knee extension    Ankle dorsiflexion    Ankle plantarflexion    Ankle inversion    Ankle eversion     (Blank rows = not tested)  LOWER EXTREMITY MMT:  MMT Right eval Left eval   Hip flexion    Hip extension    Hip abduction    Hip adduction    Hip internal rotation    Hip external rotation    Knee flexion    Knee extension    Ankle dorsiflexion    Ankle plantarflexion    Ankle inversion    Ankle eversion     (Blank rows = not tested)  LOWER EXTREMITY SPECIAL TESTS:  DNT  FUNCTIONAL TESTS:  30 Second Sit to Stand: 15 reps no UE : 635ft with FWW - 1 seated rest break  GAIT: Distance walked: 6ft Assistive device utilized: Environmental Consultant - 2 wheeled Level of assistance: Modified independence Comments: AFO R LE, flexed trunk, FWW   TREATMENT: [facility] Adult PT Treatment:                                                DATE: 01/17/25  LAQ x 10 right Nustep x 5 minutes LE only L3 STS 5 x 3  SLR 5 x 3 L  SLR 5 x 3 R Bridge 2 x 8  PPT 8 x 2  90/90 hold 10 sec x 5  S/L hip abdct 10 x 2 R S/L hip abdct x 10 , clam x 5 L Seated heel raise x10      [facility] Adult PT Treatment:                                                DATE: 01/11/2025 Therapeutic Exercise: STS x 10 SLR x 5 R S/L hip ab x 5 R 90/90 table top x 15 sec Supine PPT x 5 - 5 hold Seated heel raise x 5  PATIENT EDUCATION:  Education details: eval findings, PSFS, HEP, POC Person educated: Patient Education method: Explanation, Demonstration, and Handouts Education comprehension: verbalized understanding and returned demonstration  HOME EXERCISE PROGRAM: Access Code: [email or web address] URL: [email or web address] Date: 01/11/2025 Prepared by: Alm Kingdom  Exercises - Sit to Stand Without  Arm Support  - 1-2 x daily - 7 x weekly - 3 sets - 10-15 reps - Active Straight Leg Raise with Quad Set  - 1-2 x daily - 7 x weekly - 2-3 sets - 10 reps - Sidelying Hip Abduction  - 1-2 x daily - 7 x weekly - 2-3 sets - 10 reps - Supine 90/90 Abdominal Bracing  - 1-2 x daily - 7 x weekly - 2-3 sets - 10-15 sec hold - Supine Posterior Pelvic Tilt  - 1-2 x daily - 7 x weekly - 2 sets - 10 reps -  5 sec hold - Seated Heel Raise  - 1-2 x daily - 7 x weekly - 3 sets - 15 reps  ASSESSMENT:  CLINICAL IMPRESSION: Reviewed HEP. Pt did well despite being in increased pain today. He reported decreased pain after exercising.   EVAL: Patient is a 69 y.o. M who was seen today for physical therapy evaluation and treatment after hospital course complication from L4/5 fusion on 09/08/24 with complications and resultant thrombectomies and fasciotomies on 09/24/24. Physical findings today are consistent with documented impairments as pt demonstrates decreased trunk and LE strength as well as gait and functional mobility/endurance deficits. is well below age-related normative value, however he demonstrated good proximal hip mobility with 30 Second Sit to Stand. Trace contraction felt at R anterior tibialis, good calf strength noted. He would benefit from skilled PT services working to improve his strength and endurance after complicated hospital course and surgeries as he is operating well below independent PLOF.   OBJECTIVE IMPAIRMENTS: Abnormal gait, decreased activity tolerance, decreased balance, decreased endurance, decreased mobility, difficulty walking, decreased ROM, decreased strength, and pain  ACTIVITY LIMITATIONS: carrying, lifting, standing, squatting, stairs, transfers, and locomotion level  PARTICIPATION LIMITATIONS: meal prep, cleaning, driving, shopping, community activity, occupation, and yard work  PERSONAL FACTORS: Time since onset of injury/illness/exacerbation and 3+ comorbidities: HTN, L4/5 decompression/fusion, Hx of cancer, see other PMH are also affecting patient's functional outcome.   REHAB POTENTIAL: Good  CLINICAL DECISION MAKING: Evolving/moderate complexity  EVALUATION COMPLEXITY: Moderate   GOALS: Goals reviewed with patient? No  SHORT TERM GOALS: Target date: 02/01/2025   Pt will be compliant and knowledgeable with initial HEP for improved comfort and  carryover Baseline: initial HEP given  Goal status: INITIAL  2.  Pt will self report right LE pain no greater than 7/10 for improved comfort and functional ability Baseline: 10/10 at worst Goal status: INITIAL   LONG TERM GOALS: Target date: 04/05/2025   Pt will improve PSFS functional score to at least 5.67 for improved subjective functioning  Baseline: 3.67 Goal status: INITIAL  2.  Pt will self report right LE pain no greater than 5/10 for improved comfort and functional ability Baseline: 10/10 at worst Goal status: INITIAL   3.  Pt will increase 30 Second Sit to Stand rep count to no less than 17 reps for improved balance, strength, and functional mobility Baseline: 15 reps  Goal status: INITIAL   4.  Pt will improve distance with LRAD to at least 1252ft with no increase in back pain for improved comfort and functional ability with community ambulation  Baseline: 669ft with FWW - 1 seated rest break Goal status: INITIAL  5.  Pt will be able to perform all home ADLs independently with no R LE pain in order to improve comfort and QoL Baseline: unable Goal status: INITIAL   PLAN:  PT FREQUENCY: 2x/week  PT DURATION: 12 weeks  PLANNED INTERVENTIONS: 97164- PT Re-evaluation, 97110-Therapeutic exercises, 97530- Therapeutic activity, [id number]- Neuromuscular re-education, 97535- Self Care, [id number]- Manual therapy, [id number]- Gait training, [phone]- Electrical stimulation (unattended), [phone]- Electrical stimulation (manual), 97016- Vasopneumatic device, and Patient/Family education  PLAN FOR NEXT SESSION: assess HEP response, LE strengthening, gait training, core training/strengthening   Date of referral: 01/03/2025 Referring provider: Cornelio Bouchard, MD  Referring diagnosis?  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 (if different than referring diagnosis)  Pain in right lower leg Pain in right foot Foot drop, right Muscle weakness (generalized) Other abnormalities of gait and mobility  What was this (referring dx) caused by? Other: surgical and hospital complications  Lysle of Condition: Chronic (continuous duration > 3 months)   Laterality: Rt  Current Functional Measure Score: Patient Specific Functional Scale 3.67  Objective measurements identify impairments when they are compared to normal values, the uninvolved extremity, and prior level of function.  [x]  Yes  []  No  Objective assessment of functional ability: Severe functional limitations   Briefly describe symptoms:  Patient is a 69 y.o. M who was seen today for physical therapy evaluation and treatment after hospital course complication from L4/5 fusion on 09/08/24 with complications and resultant thrombectomies and fasciotomies on 09/24/24. Physical findings today are consistent with documented impairments as pt demonstrates decreased trunk and LE strength as well as gait and functional mobility/endurance deficits. is well below age-related normative value, however he demonstrated good proximal hip mobility with 30 Second Sit to Stand. Trace contraction felt at R anterior tibialis, good calf strength noted. He would benefit from skilled PT services working to improve his strength and endurance after complicated hospital course and surgeries as he is operating well below independent PLOF.   How did symptoms start:  Pt presents to PT after hospital stay following anaphylaxis during L4/5 fusion with subsequent complications and thrombectomies and fasciotomies R LE. Ambulating with AFO due to R foot drop, feels like today is a good day but he has a lot of nerve related pain in R foot. Did well with inpatient rehab, felt home health was not pushing him enough. Wants to be able to walk with more endurance and improve  all ADL performance to be independent.   Average pain intensity:  Last 24 hours: 4/10  Past week: 8/10  How often does the pt experience symptoms? Constantly  How much have the symptoms interfered with usual daily activities? Extremely  How has condition changed since care began at this facility? NA - initial visit  In general, how is the patients overall health? Good   BACK PAIN (STarT Back Screening Tool) No   Carita Harlene Brain, PTA 01/17/2025, 2:40 PM  "

## 2025-01-19 ENCOUNTER — Ambulatory Visit: Admitting: Family

## 2025-01-19 ENCOUNTER — Encounter: Payer: Self-pay | Admitting: Family

## 2025-01-19 DIAGNOSIS — E43 Unspecified severe protein-calorie malnutrition: Secondary | ICD-10-CM

## 2025-01-19 DIAGNOSIS — Z9889 Other specified postprocedural states: Secondary | ICD-10-CM

## 2025-01-19 DIAGNOSIS — M21371 Foot drop, right foot: Secondary | ICD-10-CM

## 2025-01-19 DIAGNOSIS — L89610 Pressure ulcer of right heel, unstageable: Secondary | ICD-10-CM

## 2025-01-19 NOTE — Progress Notes (Signed)
 "  Office Visit Note   Patient: David King           Date of Birth: 10-03-1956           MRN: [id number] Visit Date: 01/19/2025              Requested by: [name], MD [address] [place],  [place] [address] PCP: [name], MD  Chief Complaint  Patient presents with   Right Leg - Follow-up    09/29/24 and 10/06/24 I&D right calf      HPI: The patient is a 69 year old gentleman who is seen status post repeat debridement of right lower extremity anterior ulcer as well as decubitus ulcer of the right heel has been doing Vashe to dry dressing changes.  He is full weightbearing in an AFO and regular shoewear  Assessment & Plan: Visit Diagnoses:  1. Right foot drop   2. Protein-calorie malnutrition, severe   3. History of artificial skin graft   4. Decubitus ulcer of heel, right, unstageable (HCC)     Plan: Continue with Vashe to dry dressing changes may alternate with mupirocin ointment on the heel ulcer we will have Mepilex dressings sent via parachute for home delivery.  He will follow-up in the office in 2 to 3 weeks.  Follow-Up Instructions: No follow-ups on file.   Ortho Exam  Patient is alert, oriented, no adenopathy, well-dressed, normal affect, normal respiratory effort. On examination right lower extremity the anterior ulcer with steady improvement this now measures 5 cm x 1.5 cm without depth.  The satellite ulcer over the tibia currently is unroofed of its eschar this is 6 mm x 4 mm with 3 mm of depth there is no exposed bone there is flat pink tissue no surrounding erythema no drainage  On examination right heel at the posterior decubitus ulcer shows marked improvement this was debrided with a 10 blade knife of eschar there is underlying full granulation this now measures 2-1/2 cm in diameter without depth    Imaging: No results found. No images are attached to the encounter.  Labs: Lab Results  Component Value Date   HGBA1C 5.2  09/09/2024   HGBA1C 5.3 06/17/2022   CRP 6.4 (H) 10/02/2024   REPTSTATUS 10/14/2024 FINAL 10/13/2024   GRAMSTAIN NO WBC SEEN NO ORGANISMS SEEN  10/06/2024   CULT  10/13/2024    NO GROWTH Performed at [facility], [address], [place], [place] [address]      Lab Results  Component Value Date   ALBUMIN  3.1 (L) 11/04/2024   ALBUMIN  2.9 (L) 11/01/2024   ALBUMIN  2.9 (L) 10/29/2024    Lab Results  Component Value Date   MG 2.2 10/07/2024   MG 2.1 09/28/2024   MG 2.3 09/27/2024   Lab Results  Component Value Date   VD25OH 51.12 10/02/2024    No results found for: PREALBUMIN    Latest Ref Rng & Units 11/04/2024    5:00 AM 11/01/2024    3:02 AM 10/29/2024    2:39 PM  CBC EXTENDED  WBC 4.0 - 10.5 K/uL 6.0  5.8  8.5   RBC 4.22 - 5.81 MIL/uL 3.57  3.48  3.48   Hemoglobin 13.0 - 17.0 g/dL 89.2  89.6  89.4   HCT 39.0 - 52.0 % 33.8  33.7  33.2   Platelets 150 - 400 K/uL 170  165  243   NEUT# 1.7 - 7.7 K/uL 3.7  3.5  5.9   Lymph# 0.7 - 4.0 K/uL 1.3  1.4  1.4      There is no height or weight on file to calculate BMI.  Orders:  No orders of the defined types were placed in this encounter.  No orders of the defined types were placed in this encounter.    Procedures: No procedures performed  Clinical Data: No additional findings.  ROS:  All other systems negative, except as noted in the HPI. Review of Systems  Objective: Vital Signs: There were no vitals taken for this visit.  Specialty Comments:  No specialty comments available.  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 02/20/2022   Degeneration, intervertebral disc, lumbar 09/10/2021   Osteoarthritis of spine with radiculopathy, lumbar region 09/10/2021   Low back pain 06/27/2021   Gastroesophageal reflux disease 06/15/2021   Hardening of the aorta (main artery of the heart) 06/15/2021   History of malignant neoplasm of prostate 06/15/2021   Insomnia 06/15/2021   Vitamin D  deficiency 06/15/2021   Essential hypertension 01/27/2020   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   Former smoker 08/05/2019   Pulmonary emphysema (HCC) 08/05/2019   Coronary artery disease involving native coronary artery of native heart without angina pectoris 03/06/2018   Unstable angina (HCC) 02/24/2018   Spinal stenosis of lumbar region 10/07/2013   Prostate cancer [facility])    Past Medical History:  Diagnosis Date   Anxiety    new dx   Arthritis    lumbar   Atherosclerotic vascular disease    Cataract    COPD (chronic obstructive pulmonary disease) (HCC)    Diverticulosis    Elevated PSA    Headache(784.0)    MIGRAINES   Hypertension 2021   Iritis    CHRONIC IN LEFT EYE - SOME VISIAL IMPAIRMENT IN LEFT EYE   Neuromuscular disorder (HCC)    left leg/foot,pinched siactic nerve   Pain    PAIN LEFT HIP AND DOWN LT LEG WITH NUMBNESS IN LEFT LEG--PT STATES SCIATIC NERVE IMPINGEMENT - PT PLANS BACK IN THE NEAR SURGERY.   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   Renal cysts, acquired, bilateral 03/19/2013   several simple , CT   Urinary frequency    AND NOCTURIA    Family History  Problem Relation Age of Onset   Cancer Father        prostate cancer   Cancer Paternal Grandfather        prostate   Colon cancer Neg Hx    Colon polyps Neg Hx    Esophageal cancer Neg Hx    Rectal cancer  Neg Hx    Stomach cancer Neg Hx     Past Surgical History:  Procedure Laterality Date   ABDOMINAL AORTOGRAM W/LOWER EXTREMITY N/A 09/21/2024   Procedure: ABDOMINAL AORTOGRAM W/LOWER EXTREMITY;  Surgeon: Serene Gaile ORN, MD;  Location: [facility];  Service: Cardiovascular;  Laterality: N/A;   APPLICATION OF WOUND VAC  Right 09/27/2024   Procedure: APPLICATION, WOUND VAC RIGHT LOWER EXTREMITY;  Surgeon: Lanis Fonda BRAVO, MD;  Location: [facility] OR;  Service: Vascular;  Laterality: Right;   APPLICATION OF WOUND VAC Right 10/01/2024   Procedure: APPLICATION, WOUND VAC;  Surgeon: Lanis Fonda BRAVO, MD;  Location: [facility] OR;  Service: Vascular;  Laterality: Right;   APPLICATION, SKIN SUBSTITUTE Right 09/27/2024   Procedure: APPLICATION, MYRIAD SKIN SUBSTITUTE;  Surgeon: Lanis Fonda BRAVO, MD;  Location: [facility] OR;  Service: Vascular;  Laterality: Right;   APPLICATION, SKIN SUBSTITUTE Right 10/01/2024   Procedure: APPLICATION, SKIN SUBSTITUTE KERECIS 95SQ CM;  Surgeon: Lanis Fonda BRAVO, MD;  Location: [facility] OR;  Service: Vascular;  Laterality: Right;   AXILLARY-FEMORAL BYPASS GRAFT Right 09/24/2024   Procedure: CREATION, BYPASS, ARTERIAL, AXILLARY TO BILATERAL FEMORAL, USING PROPATEN X 80CM X 2 GRAFT;  Surgeon: Serene Gaile ORN, MD;  Location: [facility] OR;  Service: Vascular;  Laterality: Right;   BACK SURGERY     CARDIAC CATHETERIZATION  03/10/2018   CATARACT EXTRACTION W/ INTRAOCULAR LENS IMPLANT Left    ENDARTERECTOMY FEMORAL Bilateral 09/24/2024   Procedure: BILATERAL FEMORAL ENDARTERECTOMY;  Surgeon: Serene Gaile ORN, MD;  Location: [facility];  Service: Vascular;  Laterality: Bilateral;   EYE SURGERY     RETINAL SURGERY LEFT EYE   FASCIOTOMY Right 09/24/2024   Procedure: RIGHT LOWER LEG FOUR COMPARTMENT FASCIOTOMY WITH RESECTION OF TIBIALIS ANTERIOR;  Surgeon: Serene Gaile ORN, MD;  Location: [facility];  Service: Vascular;  Laterality: Right;   FASCIOTOMY CLOSURE Right 09/27/2024   Procedure: RIGHT LOWER EXTREMITY  FASCIOTOMY WASHOUT;  Surgeon: Lanis Fonda BRAVO, MD;  Location: [facility] OR;  Service: Vascular;  Laterality: Right;  RIGHT LEG CLOSURE   HYDROCELE EXCISION Left 03/05/2013   Procedure: HYDROCELECTOMY ADULT;  Surgeon: Donnice Gwenyth Brooks, MD;  Location: [facility];  Service: Urology;  Laterality: Left;   INCISION AND DRAINAGE OF DEEP ABSCESS, CALF Right 09/29/2024   Procedure: INCISION AND DRAINAGE OF DEEP ABSCESS, CALF;  Surgeon: Harden Jerona GAILS, MD;  Location: [facility];  Service: Orthopedics;  Laterality: Right;   INCISION AND DRAINAGE OF DEEP ABSCESS, CALF Right 10/06/2024   Procedure: INCISION AND DRAINAGE OF DEEP ABSCESS, CALF RIGHT;  Surgeon: Harden Jerona GAILS, MD;  Location: [facility];  Service: Orthopedics;  Laterality: Right;  DEBRIDEMENT RIGHT LEG   INCISION AND DRAINAGE OF WOUND Right 10/01/2024   Procedure: IRRIGATION AND DEBRIDEMENT WOUND;  Surgeon: Lanis Fonda BRAVO, MD;  Location: [facility] OR;  Service: Vascular;  Laterality: Right;   INCISIONAL HERNIA REPAIR N/A 06/18/2022   Procedure: REPAIR OF INTERNAL HERNIA;  Surgeon: Rubin Calamity, MD;  Location: [facility] OR;  Service: General;  Laterality: N/A;   INGUINAL HERNIA REPAIR Bilateral AGE 12   INSERTION OF MESH N/A 06/11/2022   Procedure: INSERTION OF MESH;  Surgeon: Rubin Calamity, MD;  Location: [facility] OR;  Service: General;  Laterality: N/A;   INSERTION OF MESH N/A 06/18/2022   Procedure: INSERTION OF MESH;  Surgeon: Rubin Calamity, MD;  Location: [facility] OR;  Service: General;  Laterality: N/A;   KYPHOPLASTY N/A 05/08/2023   Procedure: THORACIC SEVEN KYPHOPLASTY;  Surgeon: Beuford Anes, MD;  Location: [facility];  Service: Orthopedics;  Laterality: N/A;   LAPAROSCOPY N/A 06/18/2022   Procedure: LAPAROSCOPY DIAGNOSTIC;  Surgeon: Rubin Calamity, MD;  Location: [facility] OR;  Service: General;  Laterality: N/A;   LOWER EXTREMITY ANGIOGRAM Right 09/24/2024   Procedure: RIGHT LOWER EXTREMITY ANGIOGRAM AND RIGHT POPLETEAL THROMBECTOMY;  Surgeon: Serene Gaile ORN,  MD;  Location: [facility];  Service: Vascular;  Laterality: Right;   LYMPHADENECTOMY Bilateral 05/06/2013   Procedure: LYMPHADENECTOMY;  Surgeon: Noretta Ferrara, MD;  Location: [facility];  Service: Urology;  Laterality: Bilateral;   PROSTATE BIOPSY N/A 03/05/2013   Procedure: PROSTATE BIOPSY and ultrasound;  Surgeon: Donnice Gwenyth Brooks, MD;  Location: [facility];  Service: Urology;  Laterality: N/A;   REMOVAL BURSA SAC, LEFT ELBOW  1996   ROBOT ASSISTED LAPAROSCOPIC RADICAL PROSTATECTOMY N/A 05/06/2013   Procedure: ROBOTIC ASSISTED LAPAROSCOPIC RADICAL PROSTATECTOMY LEVEL 3;  Surgeon: Noretta Ferrara, MD;  Location: [facility];  Service: Urology;  Laterality: N/A;   THROMBECTOMY FEMORAL ARTERY Bilateral 09/24/2024   Procedure: BILATERAL FEMORAL ARTERY THROMBECTOMY;  Surgeon: Serene Gaile ORN, MD;  Location: [facility];  Service: Vascular;  Laterality: Bilateral;   TONSILLECTOMY     as achild   TRANSFORAMINAL LUMBAR INTERBODY FUSION (TLIF) WITH PEDICLE SCREW FIXATION 1 LEVEL Left 09/08/2024   Procedure: LEFT-SIDED LUMBAR 4- LUMBAR 5 TRANSFORAMINAL LUMBAR INTERBODY FUSION AND DECOMPRESSION WITH INSTRUMENTATION AND ALLOGRAFT;  Surgeon: Beuford Anes, MD;  Location: [facility] OR;  Service: Orthopedics;  Laterality: Left;  LEFT-SIDED LUMBAR 4- LUMBAR 5 TRANSFORAMINAL LUMBAR INTERBODY FUSION AND DECOMPRESSION WITH INSTRUMENTATION AND ALLOGRAFT   XI ROBOTIC ASSISTED VENTRAL HERNIA N/A 06/11/2022   Procedure: ROBOTIC INCISIONAL HERNIA REPAIR WITH MESH;  Surgeon: Rubin Calamity, MD;  Location: [facility] OR;  Service: General;  Laterality: N/A;   Social History   Occupational History   Not on file  Tobacco Use   Smoking status: Former    Current packs/day: 0.00    Average packs/day: 2.0 packs/day for 47.0 years (94.0 ttl pk-yrs)    Types: Cigarettes    Start date: 12/31/1969    Quit date: 12/31/2016    Years since quitting: 8.0   Smokeless tobacco: Never   Tobacco comments:    Patient is currently smoke free  since January 2018  Vaping Use   Vaping status: Never Used  Substance and Sexual Activity   Alcohol use: Not Currently    Alcohol/week: 14.0 standard drinks of alcohol    Types: 14 Shots of liquor per week    Comment: couple of drinks a night   Drug use: No   Sexual activity: Never       "

## 2025-01-20 ENCOUNTER — Ambulatory Visit

## 2025-01-20 DIAGNOSIS — M79671 Pain in right foot: Secondary | ICD-10-CM

## 2025-01-20 DIAGNOSIS — M79661 Pain in right lower leg: Secondary | ICD-10-CM

## 2025-01-20 DIAGNOSIS — M6281 Muscle weakness (generalized): Secondary | ICD-10-CM

## 2025-01-20 NOTE — Therapy (Signed)
 " OUTPATIENT PHYSICAL THERAPY LOWER EXTREMITY TREATMENT   Patient Name: David King MRN: [id number] DOB:[birth year], 69 y.o., male Today's Date: 01/20/2025  END OF SESSION:  PT End of Session - 01/20/25 1144     Visit Number 3    Number of Visits 24    Date for Recertification  04/05/25    Authorization Type [employer] Medicare    Authorization Time Period 01/11/25-03/08/25    PT Start Time 1145    PT Stop Time 1225    PT Time Calculation (min) 40 min    Activity Tolerance Patient tolerated treatment well            Past Medical History:  Diagnosis Date   Anxiety    new dx   Arthritis    lumbar   Atherosclerotic vascular disease    Cataract    COPD (chronic obstructive pulmonary disease) (HCC)    Diverticulosis    Elevated PSA    Headache(784.0)    MIGRAINES   Hypertension 2021   Iritis    CHRONIC IN LEFT EYE - SOME VISIAL IMPAIRMENT IN LEFT EYE   Neuromuscular disorder (HCC)    left leg/foot,pinched siactic nerve   Pain    PAIN LEFT HIP AND DOWN LT LEG WITH NUMBNESS IN LEFT LEG--PT STATES SCIATIC NERVE IMPINGEMENT - PT PLANS BACK IN THE NEAR SURGERY.   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   Renal cysts, acquired, bilateral 03/19/2013   several simple , CT   Urinary frequency    AND NOCTURIA   Past Surgical History:  Procedure Laterality Date   ABDOMINAL AORTOGRAM W/LOWER EXTREMITY N/A 09/21/2024   Procedure: ABDOMINAL AORTOGRAM W/LOWER EXTREMITY;  Surgeon: Serene Gaile ORN, MD;  Location: [facility];  Service: Cardiovascular;  Laterality: N/A;   APPLICATION OF WOUND VAC Right 09/27/2024   Procedure: APPLICATION, WOUND VAC RIGHT LOWER EXTREMITY;  Surgeon: Lanis Fonda BRAVO, MD;  Location: [facility] OR;  Service: Vascular;  Laterality: Right;   APPLICATION OF WOUND VAC Right 10/01/2024   Procedure: APPLICATION, WOUND VAC;  Surgeon: Lanis Fonda BRAVO, MD;  Location: [facility] OR;  Service: Vascular;  Laterality: Right;   APPLICATION,  SKIN SUBSTITUTE Right 09/27/2024   Procedure: APPLICATION, [place] SKIN SUBSTITUTE;  Surgeon: Lanis Fonda BRAVO, MD;  Location: [facility] OR;  Service: Vascular;  Laterality: Right;   APPLICATION, SKIN SUBSTITUTE Right 10/01/2024   Procedure: APPLICATION, SKIN SUBSTITUTE KERECIS 95SQ CM;  Surgeon: Lanis Fonda BRAVO, MD;  Location: [facility] OR;  Service: Vascular;  Laterality: Right;   AXILLARY-FEMORAL BYPASS GRAFT Right 09/24/2024   Procedure: CREATION, BYPASS, ARTERIAL, AXILLARY TO BILATERAL FEMORAL, USING PROPATEN X 80CM X 2 GRAFT;  Surgeon: Serene Gaile ORN, MD;  Location: [facility] OR;  Service: Vascular;  Laterality: Right;   BACK SURGERY     CARDIAC CATHETERIZATION  03/10/2018   CATARACT EXTRACTION W/ INTRAOCULAR LENS IMPLANT Left    ENDARTERECTOMY FEMORAL Bilateral 09/24/2024   Procedure: BILATERAL FEMORAL ENDARTERECTOMY;  Surgeon: Serene Gaile ORN, MD;  Location: [facility] OR;  Service: Vascular;  Laterality: Bilateral;   EYE SURGERY     RETINAL SURGERY LEFT EYE   FASCIOTOMY Right 09/24/2024   Procedure: RIGHT LOWER LEG FOUR COMPARTMENT FASCIOTOMY WITH RESECTION OF TIBIALIS ANTERIOR;  Surgeon: Serene Gaile ORN, MD;  Location: [facility];  Service: Vascular;  Laterality: Right;   FASCIOTOMY CLOSURE Right 09/27/2024   Procedure: RIGHT LOWER EXTREMITY FASCIOTOMY WASHOUT;  Surgeon: Lanis Fonda BRAVO, MD;  Location: [facility] OR;  Service: Vascular;  Laterality: Right;  RIGHT LEG CLOSURE   HYDROCELE EXCISION Left 03/05/2013   Procedure: HYDROCELECTOMY ADULT;  Surgeon: Donnice Gwenyth Brooks, MD;  Location: [facility]  Service: Urology;  Laterality: Left;   INCISION AND DRAINAGE OF DEEP ABSCESS, CALF Right 09/29/2024   Procedure: INCISION AND DRAINAGE OF DEEP ABSCESS, CALF;  Surgeon: Harden Jerona GAILS, MD;  Location: [facility];  Service: Orthopedics;  Laterality: Right;   INCISION AND DRAINAGE OF DEEP ABSCESS, CALF Right 10/06/2024   Procedure: INCISION AND DRAINAGE OF DEEP ABSCESS, CALF RIGHT;  Surgeon: Harden Jerona GAILS, MD;   Location: [facility];  Service: Orthopedics;  Laterality: Right;  DEBRIDEMENT RIGHT LEG   INCISION AND DRAINAGE OF WOUND Right 10/01/2024   Procedure: IRRIGATION AND DEBRIDEMENT WOUND;  Surgeon: Lanis Fonda BRAVO, MD;  Location: [facility] OR;  Service: Vascular;  Laterality: Right;   INCISIONAL HERNIA REPAIR N/A 06/18/2022   Procedure: REPAIR OF INTERNAL HERNIA;  Surgeon: Rubin Calamity, MD;  Location: [facility] OR;  Service: General;  Laterality: N/A;   INGUINAL HERNIA REPAIR Bilateral AGE 89   INSERTION OF MESH N/A 06/11/2022   Procedure: INSERTION OF MESH;  Surgeon: Rubin Calamity, MD;  Location: [facility] OR;  Service: General;  Laterality: N/A;   INSERTION OF MESH N/A 06/18/2022   Procedure: INSERTION OF MESH;  Surgeon: Rubin Calamity, MD;  Location: [facility] OR;  Service: General;  Laterality: N/A;   KYPHOPLASTY N/A 05/08/2023   Procedure: THORACIC SEVEN KYPHOPLASTY;  Surgeon: Beuford Anes, MD;  [facility];  Service: Orthopedics;  Laterality: N/A;   LAPAROSCOPY N/A 06/18/2022   Procedure: LAPAROSCOPY DIAGNOSTIC;  Surgeon: Rubin Calamity, MD;  Location: [facility] OR;  Service: General;  Laterality: N/A;   LOWER EXTREMITY ANGIOGRAM Right 09/24/2024   Procedure: RIGHT LOWER EXTREMITY ANGIOGRAM AND RIGHT POPLETEAL THROMBECTOMY;  Surgeon: Serene Gaile ORN, MD;  Location[facility];  Service: Vascular;  Laterality: Right;   LYMPHADENECTOMY Bilateral 05/06/2013   Procedure: REDGIE;  Surgeon: Noretta Ferrara, MD;  Location: [facility];  Service: Urology;  Laterality: Bilateral;   PROSTATE BIOPSY N/A 03/05/2013   Procedure: PROSTATE BIOPSY and ultrasound;  Surgeon: Donnice Gwenyth Brooks, MD;  Location: [facility];  Service: Urology;  Laterality: N/A;   REMOVAL BURSA SAC, LEFT ELBOW  1996   ROBOT ASSISTED LAPAROSCOPIC RADICAL PROSTATECTOMY N/A 05/06/2013   Procedure: ROBOTIC ASSISTED LAPAROSCOPIC RADICAL PROSTATECTOMY LEVEL 3;  Surgeon: Noretta Ferrara, MD;  Location: [facility];  Service: Urology;  Laterality: N/A;    THROMBECTOMY FEMORAL ARTERY Bilateral 09/24/2024   Procedure: BILATERAL FEMORAL ARTERY THROMBECTOMY;  Surgeon: Serene Gaile ORN, MD;  Location: [facility];  Service: Vascular;  Laterality: Bilateral;   TONSILLECTOMY     as achild   TRANSFORAMINAL LUMBAR INTERBODY FUSION (TLIF) WITH PEDICLE SCREW FIXATION 1 LEVEL Left 09/08/2024   Procedure: LEFT-SIDED LUMBAR 4- LUMBAR 5 TRANSFORAMINAL LUMBAR INTERBODY FUSION AND DECOMPRESSION WITH INSTRUMENTATION AND ALLOGRAFT;  Surgeon: Beuford Anes, MD;  Location: [facility] OR;  Service: Orthopedics;  Laterality: Left;  LEFT-SIDED LUMBAR 4- LUMBAR 5 TRANSFORAMINAL LUMBAR INTERBODY FUSION AND DECOMPRESSION WITH INSTRUMENTATION AND ALLOGRAFT   XI ROBOTIC ASSISTED VENTRAL HERNIA N/A 06/11/2022   Procedure: ROBOTIC INCISIONAL HERNIA REPAIR WITH MESH;  Surgeon: Rubin Calamity, MD;  Location: [facility] OR;  Service: General;  Laterality: N/A;   Patient Active Problem List   Diagnosis Date Noted   Neuropathic pain 01/03/2025   Right foot drop 01/03/2025   Protein-calorie malnutrition, severe 10/02/2024   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 09/29/2024   Lumbar radiculopathy 09/15/2024   S/P lumbar fusion 09/08/2024   Radiculopathy due to lumbar intervertebral disc disorder 09/08/2024   Ileus following gastrointestinal surgery (HCC) 06/13/2022   Hx of fasciotomy 06/11/2022   Claudication 02/20/2022   Incisional hernia, without obstruction or gangrene 02/20/2022   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 02/20/2022   Degeneration, intervertebral disc, lumbar 09/10/2021   Osteoarthritis of spine with radiculopathy, lumbar region 09/10/2021   Low back pain 06/27/2021   Gastroesophageal reflux disease 06/15/2021   Hardening of the aorta (main artery of the heart) 06/15/2021   History of malignant neoplasm of prostate 06/15/2021   Insomnia 06/15/2021   Vitamin D  deficiency 06/15/2021   Essential hypertension 01/27/2020   Plaque psoriasis  08/05/2019   Former smoker 08/05/2019   Pulmonary emphysema (HCC) 08/05/2019   Coronary artery disease involving native coronary artery of native heart without angina pectoris 03/06/2018   Unstable angina (HCC) 02/24/2018   Spinal stenosis of lumbar region 10/07/2013   Prostate cancer [facility])     PCP: [name], MD   REFERRING PROVIDER: Cornelio Bouchard, MD   REFERRING DIAG:  I73.9 (ICD-10-CM) - 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 T79.A21D (ICD-10-CM) - 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, subsequent encounter M21.371 (ICD-10-CM) - Right foot drop  THERAPY DIAG:  Pain in right lower leg  Muscle weakness (generalized)  Pain in right foot  Rationale for Evaluation and Treatment: Rehabilitation  ONSET DATE:  09/08/24 - L4/5 decompression and fusion 09/24/24 - OR x2 initially for R axial bypass, bilateral femoral thrombectomy and RLE fasciotomy and later for redo RLE thrombectomy S/p R lower leg fasciotomy 10/1 and I & D 10/3, repeat debridement 10/8  SUBJECTIVE:   SUBJECTIVE STATEMENT: Pt reports a good day today. Only 1/10 pain today in R leg. HEP done 2x/wk.     EVAL: Pt presents to PT after hospital stay following anaphylaxis during L4/5 fusion with subsequent complications and thrombectomies and fasciotomies R LE. Ambulating with AFO due to R foot drop, feels like today is a good day but he has a lot of nerve related pain in R foot. Did well with inpatient rehab, felt home health was not pushing him enough. Wants to be able to walk with more endurance and improve all ADL performance to be independent.   PERTINENT HISTORY: HTN, L4/5 decompression/fusion, Hx of cancer, see other PMH  PAIN:  Are you having pain?  Yes: NPRS scale: 7/10 Worst: 10/10 Pain location: R heel, R plantar surface, R foot  Pain description: tingling, sharp, sore Aggravating factors: walking, prolonged standing Relieving factors: rest, medication    PRECAUTIONS: None  RED FLAGS: None   WEIGHT BEARING RESTRICTIONS: No  FALLS:  Has patient fallen in last 6 months? No  LIVING ENVIRONMENT: Lives with: lives with their family Lives in: House/apartment Stairs: Yes: External: 4 steps; can reach both Has following equipment at home: Environmental Consultant - 2 wheeled, Shower bench, Grab bars, and R foot AFO  OCCUPATION: Retired   PLOF: Independent with basic ADLs and Independent with household mobility with device  PATIENT GOALS: be able to do more self care independently, be able to walk around at festival, be able to go camping   NEXT MD VISIT: 01/19/2025  OBJECTIVE:  Note: Objective measures were completed at Evaluation unless otherwise noted.  DIAGNOSTIC FINDINGS: N/A  PATIENT SURVEYS:  PSFS: THE PATIENT SPECIFIC FUNCTIONAL SCALE  Place score of 0-10 (0 = unable to perform activity  and 10 = able to perform activity at the same level as before injury or problem)  Activity Date: 01/11/25    Shower impendent  6    2.  Walking @ festival  3    3. Camping 2    4.      Total Score 3.67      Total Score = Sum of activity scores/number of activities  Minimally Detectable Change: 3 points (for single activity); 2 points (for average score)  [facility] (nd). The Patient Specific Functional Scale . Retrieved from [email or web address]   COGNITION: Overall cognitive status: Within functional limits for tasks assessed    SENSATION: Global R foot/ankle paresthesias   POSTURE: rounded shoulders, forward head, and flexed trunk   PALPATION: TTP to R anterior tibials   LOWER EXTREMITY ROM:  Active ROM Right eval Left eval  Hip flexion    Hip extension    Hip abduction    Hip adduction    Hip internal rotation    Hip external rotation    Knee flexion    Knee extension    Ankle dorsiflexion    Ankle plantarflexion    Ankle inversion    Ankle eversion     (Blank  rows = not tested)  LOWER EXTREMITY MMT:  MMT Right eval Left eval  Hip flexion    Hip extension    Hip abduction    Hip adduction    Hip internal rotation    Hip external rotation    Knee flexion    Knee extension    Ankle dorsiflexion    Ankle plantarflexion    Ankle inversion    Ankle eversion     (Blank rows = not tested)  LOWER EXTREMITY SPECIAL TESTS:  DNT  FUNCTIONAL TESTS:  30 Second Sit to Stand: 15 reps no UE : 677ft with FWW - 1 seated rest break  GAIT: Distance walked: 43ft Assistive device utilized: Environmental Consultant - 2 wheeled Level of assistance: Modified independence Comments: AFO R LE, flexed trunk, FWW   TREATMENT: 01/20/25 Therapeutic Exercise/Activity: HEP reassessment and update Seated hip marches x8x3s Seated LTR x8x3s Seated RTB clamshell 2x8x3s Seated RTB leg extension 2x8x3s Seated RTB leg curl x8x3s, x12x3s Pallof press RTB x8 BL SUE counter marches x6x3s    [facility] Adult PT Treatment:                                                DATE: 01/17/25  LAQ x 10 right Nustep x 5 minutes LE only L3 STS 5 x 3  SLR 5 x 3 L  SLR 5 x 3 R Bridge 2 x 8  PPT 8 x 2  90/90 hold 10 sec x 5  S/L hip abdct 10 x 2 R S/L hip abdct x 10 , clam x 5 L Seated heel raise x10      [facility] Adult PT Treatment:                                                DATE: 01/11/2025 Therapeutic Exercise: STS x 10 SLR x 5 R S/L hip ab x 5 R 90/90 table top x 15 sec Supine PPT x 5 - 5 hold Seated  heel raise x 5  PATIENT EDUCATION:  Education details: eval findings, PSFS, HEP, POC Person educated: Patient Education method: Explanation, Demonstration, and Handouts Education comprehension: verbalized understanding and returned demonstration  HOME EXERCISE PROGRAM: Access Code: [email or web address] URL: [email or web address] Date: 01/20/2025 Prepared by: Washington Scot  Exercises - Sit to Stand Without Arm Support  - 1-2 x daily - 7 x weekly - 3 sets - 10-15  reps - Active Straight Leg Raise with Quad Set  - 1-2 x daily - 7 x weekly - 2-3 sets - 10 reps - Sidelying Hip Abduction  - 1-2 x daily - 7 x weekly - 2-3 sets - 10 reps - Supine 90/90 Abdominal Bracing  - 1-2 x daily - 7 x weekly - 2-3 sets - 10-15 sec hold - Supine Posterior Pelvic Tilt  - 1-2 x daily - 7 x weekly - 2 sets - 10 reps - 5 sec hold - Seated Heel Raise  - 1-2 x daily - 7 x weekly - 3 sets - 15 reps - Seated Hip Abduction with Resistance  - 2 x daily - 5 x weekly - 2 sets - 6-8 reps - 3 hold - Seated Trunk Rotation - Arms Crossed  - 2 x daily - 5 x weekly - 2 sets - 6-8 reps - 3 hold  ASSESSMENT:  CLINICAL IMPRESSION: Patient tolerated treatment with no significant increases in pain with progressions in RLE, core, and balance. Current deficits include: excessive pain, functional activity tolerance/strength, balance. As a result, patient would continue to benefit from skilled PT to address said deficits via plan below.    EVAL: Patient is a 69 y.o. M who was seen today for physical therapy evaluation and treatment after hospital course complication from L4/5 fusion on 09/08/24 with complications and resultant thrombectomies and fasciotomies on 09/24/24. Physical findings today are consistent with documented impairments as pt demonstrates decreased trunk and LE strength as well as gait and functional mobility/endurance deficits. is well below age-related normative value, however he demonstrated good proximal hip mobility with 30 Second Sit to Stand. Trace contraction felt at R anterior tibialis, good calf strength noted. He would benefit from skilled PT services working to improve his strength and endurance after complicated hospital course and surgeries as he is operating well below independent PLOF.   OBJECTIVE IMPAIRMENTS: Abnormal gait, decreased activity tolerance, decreased balance, decreased endurance, decreased mobility, difficulty walking, decreased ROM, decreased strength,  and pain  ACTIVITY LIMITATIONS: carrying, lifting, standing, squatting, stairs, transfers, and locomotion level  PARTICIPATION LIMITATIONS: meal prep, cleaning, driving, shopping, community activity, occupation, and yard work  PERSONAL FACTORS: Time since onset of injury/illness/exacerbation and 3+ comorbidities: HTN, L4/5 decompression/fusion, Hx of cancer, see other PMH are also affecting patient's functional outcome.   REHAB POTENTIAL: Good  CLINICAL DECISION MAKING: Evolving/moderate complexity  EVALUATION COMPLEXITY: Moderate   GOALS: Goals reviewed with patient? No  SHORT TERM GOALS: Target date: 02/01/2025   Pt will be compliant and knowledgeable with initial HEP for improved comfort and carryover Baseline: initial HEP given  Goal status: INITIAL  2.  Pt will self report right LE pain no greater than 7/10 for improved comfort and functional ability Baseline: 10/10 at worst Goal status: INITIAL   LONG TERM GOALS: Target date: 04/05/2025   Pt will improve PSFS functional score to at least 5.67 for improved subjective functioning  Baseline: 3.67 Goal status: INITIAL  2.  Pt will self report right LE pain no greater than 5/10 for improved comfort and  functional ability Baseline: 10/10 at worst Goal status: INITIAL   3.  Pt will increase 30 Second Sit to Stand rep count to no less than 17 reps for improved balance, strength, and functional mobility Baseline: 15 reps  Goal status: INITIAL   4.  Pt will improve distance with LRAD to at least 1215ft with no increase in back pain for improved comfort and functional ability with community ambulation  Baseline: 626ft with FWW - 1 seated rest break Goal status: INITIAL  5.  Pt will be able to perform all home ADLs independently with no R LE pain in order to improve comfort and QoL Baseline: unable Goal status: INITIAL   PLAN:  PT FREQUENCY: 2x/week  PT DURATION: 12 weeks  PLANNED INTERVENTIONS: [id number]- PT  Re-evaluation, 97110-Therapeutic exercises, 97530- Therapeutic activity, [id number]- Neuromuscular re-education, 97535- Self Care, [id number]- Manual therapy, [id number]- Gait training, [phone]- Electrical stimulation (unattended), [id number]- Electrical stimulation (manual), 97016- Vasopneumatic device, and Patient/Family education  PLAN FOR NEXT SESSION: assess HEP response, LE strengthening, gait training, core training/strengthening   Date of referral: 01/03/2025 Referring provider: Cornelio Bouchard, MD  Referring diagnosis?  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 (if different than referring diagnosis)  Pain in right lower leg Pain in right foot Foot drop, right Muscle weakness (generalized) Other abnormalities of gait and mobility  What was this (referring dx) caused by? Other: surgical and hospital complications  Lysle of Condition: Chronic (continuous duration > 3 months)   Laterality: Rt  Current Functional Measure Score: Patient Specific Functional Scale 3.67  Objective measurements identify impairments when they are compared to normal values, the uninvolved extremity, and prior level of function.  [x]  Yes  []  No  Objective assessment of functional ability: Severe functional limitations   Briefly describe symptoms:  Patient is a 69 y.o. M who was seen today for physical therapy evaluation and treatment after hospital course complication from L4/5 fusion on 09/08/24 with complications and resultant thrombectomies and fasciotomies on 09/24/24. Physical findings today are consistent with documented impairments as pt demonstrates decreased trunk and LE strength as well as gait and functional mobility/endurance deficits. is well below age-related normative value, however he demonstrated good proximal  hip mobility with 30 Second Sit to Stand. Trace contraction felt at R anterior tibialis, good calf strength noted. He would benefit from skilled PT services working to improve his strength and endurance after complicated hospital course and surgeries as he is operating well below independent PLOF.   How did symptoms start:  Pt presents to PT after hospital stay following anaphylaxis during L4/5 fusion with subsequent complications and thrombectomies and fasciotomies R LE. Ambulating with AFO due to R foot drop, feels like today is a good day but he has a lot of nerve related pain in R foot. Did well with inpatient rehab, felt home health was not pushing him enough. Wants to be able to walk with more endurance and improve all ADL performance to be independent.   Average pain intensity:  Last 24 hours: 4/10  Past week: 8/10  How often does the pt experience symptoms? Constantly  How much have the symptoms interfered with usual daily activities? Extremely  How has condition changed since care began at this facility? NA - initial visit  In general, how is the patients overall health? Good   BACK PAIN (STarT  Back Screening Tool) No   Washington Greener  [place], PT 01/20/2025, 2:28 PM  "

## 2025-01-24 ENCOUNTER — Encounter: Payer: Self-pay | Admitting: Physical Medicine and Rehabilitation

## 2025-01-24 ENCOUNTER — Ambulatory Visit

## 2025-01-26 ENCOUNTER — Ambulatory Visit

## 2025-01-26 ENCOUNTER — Telehealth: Payer: Self-pay | Admitting: Acute Care

## 2025-01-26 MED ORDER — NALTREXONE HCL 50 MG PO TABS: 25.0000 mg | ORAL_TABLET | Freq: Every day | ORAL | Status: AC

## 2025-01-26 NOTE — Telephone Encounter (Signed)
 Decided to add Low dose naltrexone   Will do 4 mg at bedtime x 1 week, then 8 mg at bedtime- for nerve pain.   Called into to [facility]- [phone] [address]   D/w pt and he's ok with it- they will call him when it's ready.

## 2025-01-26 NOTE — Telephone Encounter (Signed)
 Called patient to schedule LDCT. Patient was due in 07/2024 for 6 month follow up, however declined to schedule until one year mark. I called patient to schedule at the one year mark but patient still declined to schedule. Patient states he has a lot going on with a lot of doctor appointments. Patient aware to call our office to schedule. Nothing further needed at this time. Will forward to PCP as FYI.

## 2025-01-28 ENCOUNTER — Ambulatory Visit

## 2025-01-31 ENCOUNTER — Ambulatory Visit

## 2025-02-04 ENCOUNTER — Ambulatory Visit

## 2025-02-04 DIAGNOSIS — M6281 Muscle weakness (generalized): Secondary | ICD-10-CM

## 2025-02-04 DIAGNOSIS — R2689 Other abnormalities of gait and mobility: Secondary | ICD-10-CM

## 2025-02-04 DIAGNOSIS — M79671 Pain in right foot: Secondary | ICD-10-CM

## 2025-02-04 DIAGNOSIS — M79661 Pain in right lower leg: Secondary | ICD-10-CM

## 2025-02-04 DIAGNOSIS — M21371 Foot drop, right foot: Secondary | ICD-10-CM

## 2025-02-04 NOTE — Therapy (Incomplete)
 " OUTPATIENT PHYSICAL THERAPY LOWER EXTREMITY TREATMENT   Patient Name: David King MRN: [id number] DOB:Sep 14, 1956, 69 y.o., male Today's Date: 02/04/2025  END OF SESSION:  PT End of Session - 02/04/25 1101     Visit Number 4    Number of Visits 24    Date for Recertification  04/05/25    Authorization Type [employer] Medicare    Authorization Time Period 01/11/25-03/08/25    PT Start Time 1057    Activity Tolerance Patient tolerated treatment well    Behavior During Therapy [facility] for tasks assessed/performed             Past Medical History:  Diagnosis Date   Anxiety    new dx   Arthritis    lumbar   Atherosclerotic vascular disease    Cataract    COPD (chronic obstructive pulmonary disease) (HCC)    Diverticulosis    Elevated PSA    Headache(784.0)    MIGRAINES   Hypertension 2021   Iritis    CHRONIC IN LEFT EYE - SOME VISIAL IMPAIRMENT IN LEFT EYE   Neuromuscular disorder (HCC)    left leg/foot,pinched siactic nerve   Pain    PAIN LEFT HIP AND DOWN LT LEG WITH NUMBNESS IN LEFT LEG--PT STATES SCIATIC NERVE IMPINGEMENT - PT PLANS BACK IN THE NEAR SURGERY.   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   Renal cysts, acquired, bilateral 03/19/2013   several simple , CT   Urinary frequency    AND NOCTURIA   Past Surgical History:  Procedure Laterality Date   ABDOMINAL AORTOGRAM W/LOWER EXTREMITY N/A 09/21/2024   Procedure: ABDOMINAL AORTOGRAM W/LOWER EXTREMITY;  Surgeon: Serene Gaile ORN, MD;  Location: [facility];  Service: Cardiovascular;  Laterality: N/A;   APPLICATION OF WOUND VAC Right 09/27/2024   Procedure: APPLICATION, WOUND VAC RIGHT LOWER EXTREMITY;  Surgeon: Lanis Fonda BRAVO, MD;  Location: [facility] OR;  Service: Vascular;  Laterality: Right;   APPLICATION OF WOUND VAC Right 10/01/2024   Procedure: APPLICATION, WOUND VAC;  Surgeon: Lanis Fonda BRAVO, MD;  Location: [facility] OR;  Service: Vascular;  Laterality: Right;    APPLICATION, SKIN SUBSTITUTE Right 09/27/2024   Procedure: APPLICATION, MYRIAD SKIN SUBSTITUTE;  Surgeon: Lanis Fonda BRAVO, MD;  Location: [facility] OR;  Service: Vascular;  Laterality: Right;   APPLICATION, SKIN SUBSTITUTE Right 10/01/2024   Procedure: APPLICATION, SKIN SUBSTITUTE KERECIS 95SQ CM;  Surgeon: Lanis Fonda BRAVO, MD;  Location: [facility] OR;  Service: Vascular;  Laterality: Right;   AXILLARY-FEMORAL BYPASS GRAFT Right 09/24/2024   Procedure: CREATION, BYPASS, ARTERIAL, AXILLARY TO BILATERAL FEMORAL, USING PROPATEN X 80CM X 2 GRAFT;  Surgeon: Serene Gaile ORN, MD;  Location: [facility] OR;  Service: Vascular;  Laterality: Right;   BACK SURGERY     CARDIAC CATHETERIZATION  03/10/2018   CATARACT EXTRACTION W/ INTRAOCULAR LENS IMPLANT Left    ENDARTERECTOMY FEMORAL Bilateral 09/24/2024   Procedure: BILATERAL FEMORAL ENDARTERECTOMY;  Surgeon: Serene Gaile ORN, MD;  Location: [facility] OR;  Service: Vascular;  Laterality: Bilateral;   EYE SURGERY     RETINAL SURGERY LEFT EYE   FASCIOTOMY Right 09/24/2024   Procedure: RIGHT LOWER LEG FOUR COMPARTMENT FASCIOTOMY WITH RESECTION OF TIBIALIS ANTERIOR;  Surgeon: Serene Gaile ORN, MD;  Location: [facility] OR;  Service: Vascular;  Laterality: Right;   FASCIOTOMY CLOSURE Right 09/27/2024   Procedure: RIGHT LOWER EXTREMITY FASCIOTOMY WASHOUT;  Surgeon: Lanis Fonda BRAVO, MD;  Location: [facility] OR;  Service: Vascular;  Laterality:  Right;  RIGHT LEG CLOSURE   HYDROCELE EXCISION Left 03/05/2013   Procedure: HYDROCELECTOMY ADULT;  Surgeon: Donnice Gwenyth Brooks, MD;  Location: [facility];  Service: Urology;  Laterality: Left;   INCISION AND DRAINAGE OF DEEP ABSCESS, CALF Right 09/29/2024   Procedure: INCISION AND DRAINAGE OF DEEP ABSCESS, CALF;  Surgeon: Harden Jerona GAILS, MD;  Location: [facility] OR;  Service: Orthopedics;  Laterality: Right;   INCISION AND DRAINAGE OF DEEP ABSCESS, CALF Right 10/06/2024   Procedure: INCISION AND DRAINAGE OF DEEP ABSCESS, CALF RIGHT;  Surgeon: Harden Jerona GAILS, MD;  Location: [facility] OR;  Service: Orthopedics;  Laterality: Right;  DEBRIDEMENT RIGHT LEG   INCISION AND DRAINAGE OF WOUND Right 10/01/2024   Procedure: IRRIGATION AND DEBRIDEMENT WOUND;  Surgeon: Lanis Fonda BRAVO, MD;  Location: [facility] OR;  Service: Vascular;  Laterality: Right;   INCISIONAL HERNIA REPAIR N/A 06/18/2022   Procedure: REPAIR OF INTERNAL HERNIA;  Surgeon: Rubin Calamity, MD;  Location: [facility] OR;  Service: General;  Laterality: N/A;   INGUINAL HERNIA REPAIR Bilateral AGE 66   INSERTION OF MESH N/A 06/11/2022   Procedure: INSERTION OF MESH;  Surgeon: Rubin Calamity, MD;  Location: [facility] OR;  Service: General;  Laterality: N/A;   INSERTION OF MESH N/A 06/18/2022   Procedure: INSERTION OF MESH;  Surgeon: Rubin Calamity, MD;  Location: [facility] OR;  Service: General;  Laterality: N/A;   KYPHOPLASTY N/A 05/08/2023   Procedure: THORACIC SEVEN KYPHOPLASTY;  Surgeon: Beuford Anes, MD;  Location: [facility] OR;  Service: Orthopedics;  Laterality: N/A;   LAPAROSCOPY N/A 06/18/2022   Procedure: LAPAROSCOPY DIAGNOSTIC;  Surgeon: Rubin Calamity, MD;  Location: [facility] OR;  Service: General;  Laterality: N/A;   LOWER EXTREMITY ANGIOGRAM Right 09/24/2024   Procedure: RIGHT LOWER EXTREMITY ANGIOGRAM AND RIGHT POPLETEAL THROMBECTOMY;  Surgeon: Serene Gaile ORN, MD;  Location: [facility] OR;  Service: Vascular;  Laterality: Right;   LYMPHADENECTOMY Bilateral 05/06/2013   Procedure: REDGIE;  Surgeon: Noretta Ferrara, MD;  Location: [facility] ORS;  Service: Urology;  Laterality: Bilateral;   PROSTATE BIOPSY N/A 03/05/2013   Procedure: PROSTATE BIOPSY and ultrasound;  Surgeon: Donnice Gwenyth Brooks, MD;  Location: [facility];  Service: Urology;  Laterality: N/A;   REMOVAL BURSA SAC, LEFT ELBOW  1996   ROBOT ASSISTED LAPAROSCOPIC RADICAL PROSTATECTOMY N/A 05/06/2013   Procedure: ROBOTIC ASSISTED LAPAROSCOPIC RADICAL PROSTATECTOMY LEVEL 3;  Surgeon: Noretta Ferrara, MD;  Location: [facility] ORS;  Service: Urology;   Laterality: N/A;   THROMBECTOMY FEMORAL ARTERY Bilateral 09/24/2024   Procedure: BILATERAL FEMORAL ARTERY THROMBECTOMY;  Surgeon: Serene Gaile ORN, MD;  Location: [facility] OR;  Service: Vascular;  Laterality: Bilateral;   TONSILLECTOMY     as achild   TRANSFORAMINAL LUMBAR INTERBODY FUSION (TLIF) WITH PEDICLE SCREW FIXATION 1 LEVEL Left 09/08/2024   Procedure: LEFT-SIDED LUMBAR 4- LUMBAR 5 TRANSFORAMINAL LUMBAR INTERBODY FUSION AND DECOMPRESSION WITH INSTRUMENTATION AND ALLOGRAFT;  Surgeon: Beuford Anes, MD;  Location: [facility] OR;  Service: Orthopedics;  Laterality: Left;  LEFT-SIDED LUMBAR 4- LUMBAR 5 TRANSFORAMINAL LUMBAR INTERBODY FUSION AND DECOMPRESSION WITH INSTRUMENTATION AND ALLOGRAFT   XI ROBOTIC ASSISTED VENTRAL HERNIA N/A 06/11/2022   Procedure: ROBOTIC INCISIONAL HERNIA REPAIR WITH MESH;  Surgeon: Rubin Calamity, MD;  Location: [facility] OR;  Service: General;  Laterality: N/A;   Patient Active Problem List   Diagnosis Date Noted   Neuropathic pain 01/03/2025   Right foot drop 01/03/2025   Protein-calorie malnutrition, severe 10/02/2024   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 09/29/2024  Lumbar radiculopathy 09/15/2024   S/P lumbar fusion 09/08/2024   Radiculopathy due to lumbar intervertebral disc disorder 09/08/2024   Ileus following gastrointestinal surgery (HCC) 06/13/2022   Hx of fasciotomy 06/11/2022   Claudication 02/20/2022   Incisional hernia, without obstruction or gangrene 02/20/2022   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 02/20/2022   Degeneration, intervertebral disc, lumbar 09/10/2021   Osteoarthritis of spine with radiculopathy, lumbar region 09/10/2021   Low back pain 06/27/2021   Gastroesophageal reflux disease 06/15/2021   Hardening of the aorta (main artery of the heart) 06/15/2021   History of malignant neoplasm of prostate 06/15/2021   Insomnia 06/15/2021   Vitamin D  deficiency 06/15/2021   Essential hypertension 01/27/2020    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   Former smoker 08/05/2019   Pulmonary emphysema (HCC) 08/05/2019   Coronary artery disease involving native coronary artery of native heart without angina pectoris 03/06/2018   Unstable angina (HCC) 02/24/2018   Spinal stenosis of lumbar region 10/07/2013   Prostate cancer [facility])     PCP: [name], MD   REFERRING PROVIDER: Cornelio Bouchard, MD   REFERRING DIAG:  I73.9 (ICD-10-CM) - 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 T79.A21D (ICD-10-CM) - 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, subsequent encounter M21.371 (ICD-10-CM) - Right foot drop  THERAPY DIAG:  Pain in right lower leg  Muscle weakness (generalized)  Pain in right foot  Foot drop, right  Other abnormalities of gait and mobility  Rationale for Evaluation and Treatment: Rehabilitation  ONSET DATE:  09/08/24 - L4/5 decompression and fusion 09/24/24 - OR x2 initially for R axial bypass, bilateral femoral thrombectomy and RLE fasciotomy and later for redo RLE thrombectomy S/p R lower leg fasciotomy 10/1 and I & D 10/3, repeat debridement 10/8  SUBJECTIVE:   SUBJECTIVE STATEMENT: Pt presents to PT with 3/10 pain in R LE. Has been compliant with HEP.   EVAL: Pt presents to PT after hospital stay following anaphylaxis during L4/5 fusion with subsequent complications and thrombectomies and fasciotomies R LE. Ambulating with AFO due to R foot drop, feels like today is a good day but he has a lot of nerve related pain in R foot. Did well with inpatient rehab, felt home health was not pushing him enough. Wants to be able to walk with more endurance and improve all ADL performance to be independent.   PERTINENT HISTORY: HTN, L4/5 decompression/fusion, Hx of cancer, see other PMH  PAIN:  Are you having pain?  Yes: NPRS scale: 7/10 Worst: 10/10 Pain location: R heel, R plantar surface, R foot  Pain description: tingling, sharp, sore Aggravating  factors: walking, prolonged standing Relieving factors: rest, medication   PRECAUTIONS: None  RED FLAGS: None   WEIGHT BEARING RESTRICTIONS: No  FALLS:  Has patient fallen in last 6 months? No  LIVING ENVIRONMENT: Lives with: lives with their family Lives in: House/apartment Stairs: Yes: External: 4 steps; can reach both Has following equipment at home: Environmental Consultant - 2 wheeled, Shower bench, Grab bars, and R foot AFO  OCCUPATION: Retired   PLOF: Independent with basic ADLs and Independent with household mobility with device  PATIENT GOALS: be able to do more self care independently, be able to walk around at festival, be able to go camping   NEXT MD VISIT: 01/19/2025  OBJECTIVE:  Note: Objective measures were completed at Evaluation unless otherwise noted.  DIAGNOSTIC FINDINGS: N/A  PATIENT SURVEYS:  PSFS: THE PATIENT SPECIFIC FUNCTIONAL SCALE  Place score of 0-10 (0 =  unable to perform activity and 10 = able to perform activity at the same level as before injury or problem)  Activity Date: 01/11/25    Shower impendent  6    2.  Walking @ festival  3    3. Camping 2    4.      Total Score 3.67      Total Score = Sum of activity scores/number of activities  Minimally Detectable Change: 3 points (for single activity); 2 points (for average score)  [facility] (nd). The Patient Specific Functional Scale . Retrieved from [email or web address]   COGNITION: Overall cognitive status: Within functional limits for tasks assessed    SENSATION: Global R foot/ankle paresthesias   POSTURE: rounded shoulders, forward head, and flexed trunk   PALPATION: TTP to R anterior tibials   LOWER EXTREMITY ROM:  Active ROM Right eval Left eval  Hip flexion    Hip extension    Hip abduction    Hip adduction    Hip internal rotation    Hip external rotation    Knee flexion    Knee extension    Ankle dorsiflexion     Ankle plantarflexion    Ankle inversion    Ankle eversion     (Blank rows = not tested)  LOWER EXTREMITY MMT:  MMT Right eval Left eval  Hip flexion    Hip extension    Hip abduction    Hip adduction    Hip internal rotation    Hip external rotation    Knee flexion    Knee extension    Ankle dorsiflexion    Ankle plantarflexion    Ankle inversion    Ankle eversion     (Blank rows = not tested)  LOWER EXTREMITY SPECIAL TESTS:  DNT  FUNCTIONAL TESTS:  30 Second Sit to Stand: 15 reps no UE : 662ft with FWW - 1 seated rest break  GAIT: Distance walked: 14ft Assistive device utilized: Environmental Consultant - 2 wheeled Level of assistance: Modified independence Comments: AFO R LE, flexed trunk, FWW   TREATMENT: [facility] Adult PT Treatment:                                                DATE: 02/04/25 Nustep x 4 minutes LE only L6  Pallof press 2x10 10# Standing hip abd 2x10 25# STS 10# DB 3x10 90/90 table top hold 3x20 90/90 alt taps 2x5 S/L hip abd 2x10 each Bridge with ball x 15  01/20/25 Therapeutic Exercise/Activity: HEP reassessment and update Seated hip marches x8x3s Seated LTR x8x3s Seated RTB clamshell 2x8x3s Seated RTB leg extension 2x8x3s Seated RTB leg curl x8x3s, x12x3s Pallof press RTB x8 BL SUE counter marches x6x3s    [facility] Adult PT Treatment:                                                DATE: 01/17/25 LAQ x 10 right Nustep x 5 minutes LE only L3 STS 5 x 3  SLR 5 x 3 L  SLR 5 x 3 R Bridge 2 x 8  PPT 8 x 2  90/90 hold 10 sec x 5  S/L hip abdct 10 x 2 R S/L hip abdct x  10 , clam x 5 L Seated heel raise x10   [facility] Adult PT Treatment:                                                DATE: 01/11/2025 Therapeutic Exercise: STS x 10 SLR x 5 R S/L hip ab x 5 R 90/90 table top x 15 sec Supine PPT x 5 - 5 hold Seated heel raise x 5  PATIENT EDUCATION:  Education details: eval findings, PSFS, HEP, POC Person educated: Patient Education method:  Explanation, Demonstration, and Handouts Education comprehension: verbalized understanding and returned demonstration  HOME EXERCISE PROGRAM: Access Code: [email or web address] URL: [email or web address] Date: 01/20/2025 Prepared by: Washington Scot  Exercises - Sit to Stand Without Arm Support  - 1-2 x daily - 7 x weekly - 3 sets - 10-15 reps - Active Straight Leg Raise with Quad Set  - 1-2 x daily - 7 x weekly - 2-3 sets - 10 reps - Sidelying Hip Abduction  - 1-2 x daily - 7 x weekly - 2-3 sets - 10 reps - Supine 90/90 Abdominal Bracing  - 1-2 x daily - 7 x weekly - 2-3 sets - 10-15 sec hold - Supine Posterior Pelvic Tilt  - 1-2 x daily - 7 x weekly - 2 sets - 10 reps - 5 sec hold - Seated Heel Raise  - 1-2 x daily - 7 x weekly - 3 sets - 15 reps - Seated Hip Abduction with Resistance  - 2 x daily - 5 x weekly - 2 sets - 6-8 reps - 3 hold - Seated Trunk Rotation - Arms Crossed  - 2 x daily - 5 x weekly - 2 sets - 6-8 reps - 3 hold  ASSESSMENT:  CLINICAL IMPRESSION: ***   EVAL: Patient is a 69 y.o. M who was seen today for physical therapy evaluation and treatment after hospital course complication from L4/5 fusion on 09/08/24 with complications and resultant thrombectomies and fasciotomies on 09/24/24. Physical findings today are consistent with documented impairments as pt demonstrates decreased trunk and LE strength as well as gait and functional mobility/endurance deficits. is well below age-related normative value, however he demonstrated good proximal hip mobility with 30 Second Sit to Stand. Trace contraction felt at R anterior tibialis, good calf strength noted. He would benefit from skilled PT services working to improve his strength and endurance after complicated hospital course and surgeries as he is operating well below independent PLOF.   OBJECTIVE IMPAIRMENTS: Abnormal gait, decreased activity tolerance, decreased balance, decreased endurance, decreased mobility, difficulty  walking, decreased ROM, decreased strength, and pain  ACTIVITY LIMITATIONS: carrying, lifting, standing, squatting, stairs, transfers, and locomotion level  PARTICIPATION LIMITATIONS: meal prep, cleaning, driving, shopping, community activity, occupation, and yard work  PERSONAL FACTORS: Time since onset of injury/illness/exacerbation and 3+ comorbidities: HTN, L4/5 decompression/fusion, Hx of cancer, see other PMH are also affecting patient's functional outcome.   REHAB POTENTIAL: Good  CLINICAL DECISION MAKING: Evolving/moderate complexity  EVALUATION COMPLEXITY: Moderate   GOALS: Goals reviewed with patient? No  SHORT TERM GOALS: Target date: 02/01/2025   Pt will be compliant and knowledgeable with initial HEP for improved comfort and carryover Baseline: initial HEP given  Goal status: INITIAL  2.  Pt will self report right LE pain no greater than 7/10 for improved comfort and functional ability Baseline: 10/10 at worst  Goal status: INITIAL   LONG TERM GOALS: Target date: 04/05/2025   Pt will improve PSFS functional score to at least 5.67 for improved subjective functioning  Baseline: 3.67 Goal status: INITIAL  2.  Pt will self report right LE pain no greater than 5/10 for improved comfort and functional ability Baseline: 10/10 at worst Goal status: INITIAL   3.  Pt will increase 30 Second Sit to Stand rep count to no less than 17 reps for improved balance, strength, and functional mobility Baseline: 15 reps  Goal status: INITIAL   4.  Pt will improve distance with LRAD to at least 1259ft with no increase in back pain for improved comfort and functional ability with community ambulation  Baseline: 619ft with FWW - 1 seated rest break Goal status: INITIAL  5.  Pt will be able to perform all home ADLs independently with no R LE pain in order to improve comfort and QoL Baseline: unable Goal status: INITIAL   PLAN:  PT FREQUENCY: 2x/week  PT DURATION: 12  weeks  PLANNED INTERVENTIONS: 97164- PT Re-evaluation, 97110-Therapeutic exercises, 97530- Therapeutic activity, [id number]- Neuromuscular re-education, 97535- Self Care, [id number]- Manual therapy, [id number]- Gait training, [id number]- Electrical stimulation (unattended), [id number]- Electrical stimulation (manual), 97016- Vasopneumatic device, and Patient/Family education  PLAN FOR NEXT SESSION: assess HEP response, LE strengthening, gait training, core training/strengthening   Date of referral: 01/03/2025 Referring provider: Cornelio Bouchard, MD  Referring diagnosis?  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 (if different than referring diagnosis)  Pain in right lower leg Pain in right foot Foot drop, right Muscle weakness (generalized) Other abnormalities of gait and mobility  What was this (referring dx) caused by? Other: surgical and hospital complications  Lysle of Condition: Chronic (continuous duration > 3 months)   Laterality: Rt  Current Functional Measure Score: Patient Specific Functional Scale 3.67  Objective measurements identify impairments when they are compared to normal values, the uninvolved extremity, and prior level of function.  [x]  Yes  []  No  Objective assessment of functional ability: Severe functional limitations   Briefly describe symptoms:  Patient is a 69 y.o. M who was seen today for physical therapy evaluation and treatment after hospital course complication from L4/5 fusion on 09/08/24 with complications and resultant thrombectomies and fasciotomies on 09/24/24. Physical findings today are consistent with documented impairments as pt demonstrates decreased trunk and LE strength as well as gait and functional mobility/endurance deficits. is well below age-related normative  value, however he demonstrated good proximal hip mobility with 30 Second Sit to Stand. Trace contraction felt at R anterior tibialis, good calf strength noted. He would benefit from skilled PT services working to improve his strength and endurance after complicated hospital course and surgeries as he is operating well below independent PLOF.   How did symptoms start:  Pt presents to PT after hospital stay following anaphylaxis during L4/5 fusion with subsequent complications and thrombectomies and fasciotomies R LE. Ambulating with AFO due to R foot drop, feels like today is a good day but he has a lot of nerve related pain in R foot. Did well with inpatient rehab, felt home health was not pushing him enough. Wants to be able to walk with more endurance and improve all ADL performance to be independent.   Average pain intensity:  Last 24 hours: 4/10  Past week: 8/10  How often does the pt experience symptoms? Constantly  How much have the symptoms interfered with usual daily activities? Extremely  How has condition changed since care began at this facility? NA - initial visit  In general, how is the patients overall health? Good   BACK PAIN (STarT Back Screening Tool) No   Alm JAYSON Kingdom, PT 02/04/2025, 11:01 AM  "

## 2025-02-07 ENCOUNTER — Ambulatory Visit

## 2025-02-09 ENCOUNTER — Ambulatory Visit: Admitting: Family

## 2025-02-11 ENCOUNTER — Ambulatory Visit

## 2025-03-14 ENCOUNTER — Ambulatory Visit (HOSPITAL_COMMUNITY)

## 2025-03-14 ENCOUNTER — Ambulatory Visit

## 2025-03-15 ENCOUNTER — Ambulatory Visit: Admitting: Podiatry

## 2025-04-04 ENCOUNTER — Encounter: Admitting: Physical Medicine and Rehabilitation

## 2025-04-11 ENCOUNTER — Encounter: Admitting: Physical Medicine and Rehabilitation

## 2025-04-25 ENCOUNTER — Encounter: Attending: Registered Nurse | Admitting: Physical Medicine and Rehabilitation
